# Patient Record
Sex: Female | Born: 1975 | Race: Black or African American | Hispanic: No | Smoking: Current every day smoker
Health system: Southern US, Community
[De-identification: ages and names within clinical notes are randomized; demographics above are authoritative.]

## PROBLEM LIST (undated history)

## (undated) DIAGNOSIS — B20 Human immunodeficiency virus [HIV] disease: Secondary | ICD-10-CM

## (undated) DIAGNOSIS — F329 Major depressive disorder, single episode, unspecified: Secondary | ICD-10-CM

## (undated) DIAGNOSIS — F32A Depression, unspecified: Secondary | ICD-10-CM

## (undated) DIAGNOSIS — Z21 Asymptomatic human immunodeficiency virus [HIV] infection status: Secondary | ICD-10-CM

## (undated) DIAGNOSIS — J45909 Unspecified asthma, uncomplicated: Secondary | ICD-10-CM

## (undated) HISTORY — PX: ABDOMINAL HYSTERECTOMY: SHX81

## (undated) HISTORY — PX: APPENDECTOMY: SHX54

---

## 1997-12-30 DIAGNOSIS — J45909 Unspecified asthma, uncomplicated: Secondary | ICD-10-CM | POA: Diagnosis present

## 1997-12-30 DIAGNOSIS — G43909 Migraine, unspecified, not intractable, without status migrainosus: Secondary | ICD-10-CM | POA: Insufficient documentation

## 2002-09-18 ENCOUNTER — Encounter: Payer: Self-pay | Admitting: Emergency Medicine

## 2002-09-18 ENCOUNTER — Emergency Department (HOSPITAL_COMMUNITY): Admission: EM | Admit: 2002-09-18 | Discharge: 2002-09-18 | Payer: Self-pay | Admitting: Emergency Medicine

## 2002-10-05 ENCOUNTER — Emergency Department (HOSPITAL_COMMUNITY): Admission: EM | Admit: 2002-10-05 | Discharge: 2002-10-05 | Payer: Self-pay | Admitting: *Deleted

## 2004-09-18 ENCOUNTER — Other Ambulatory Visit: Payer: Self-pay

## 2004-09-18 ENCOUNTER — Emergency Department: Payer: Self-pay | Admitting: Emergency Medicine

## 2004-11-22 ENCOUNTER — Emergency Department: Payer: Self-pay | Admitting: Emergency Medicine

## 2004-12-26 ENCOUNTER — Inpatient Hospital Stay: Payer: Self-pay | Admitting: Specialist

## 2005-01-05 ENCOUNTER — Ambulatory Visit: Payer: Self-pay | Admitting: Gastroenterology

## 2005-01-24 ENCOUNTER — Emergency Department: Payer: Self-pay | Admitting: Emergency Medicine

## 2005-02-07 ENCOUNTER — Emergency Department: Payer: Self-pay | Admitting: Emergency Medicine

## 2005-07-19 ENCOUNTER — Emergency Department: Payer: Self-pay | Admitting: Emergency Medicine

## 2005-08-06 ENCOUNTER — Emergency Department: Payer: Self-pay | Admitting: Emergency Medicine

## 2005-08-07 ENCOUNTER — Ambulatory Visit: Payer: Self-pay | Admitting: Emergency Medicine

## 2005-12-06 ENCOUNTER — Emergency Department: Payer: Self-pay | Admitting: Emergency Medicine

## 2005-12-28 ENCOUNTER — Ambulatory Visit: Payer: Self-pay | Admitting: Surgery

## 2006-01-23 ENCOUNTER — Emergency Department: Payer: Self-pay | Admitting: Emergency Medicine

## 2006-01-30 ENCOUNTER — Ambulatory Visit: Payer: Self-pay | Admitting: Oncology

## 2006-02-20 ENCOUNTER — Other Ambulatory Visit: Admission: RE | Admit: 2006-02-20 | Discharge: 2006-02-20 | Payer: Self-pay | Admitting: Gynecologic Oncology

## 2006-02-20 ENCOUNTER — Other Ambulatory Visit: Admission: RE | Admit: 2006-02-20 | Discharge: 2006-02-20 | Payer: Self-pay | Admitting: Oncology

## 2006-02-20 ENCOUNTER — Ambulatory Visit: Admission: RE | Admit: 2006-02-20 | Discharge: 2006-02-20 | Payer: Self-pay | Admitting: Gynecologic Oncology

## 2006-03-30 ENCOUNTER — Ambulatory Visit: Payer: Self-pay | Admitting: Oncology

## 2006-04-19 ENCOUNTER — Emergency Department: Payer: Self-pay | Admitting: Emergency Medicine

## 2006-04-30 ENCOUNTER — Emergency Department: Payer: Self-pay | Admitting: Emergency Medicine

## 2006-05-03 ENCOUNTER — Ambulatory Visit: Payer: Self-pay | Admitting: Unknown Physician Specialty

## 2006-05-09 ENCOUNTER — Emergency Department: Payer: Self-pay | Admitting: Unknown Physician Specialty

## 2006-06-04 ENCOUNTER — Emergency Department: Payer: Self-pay | Admitting: General Practice

## 2007-01-21 ENCOUNTER — Ambulatory Visit: Payer: Self-pay | Admitting: Unknown Physician Specialty

## 2007-01-27 ENCOUNTER — Emergency Department: Payer: Self-pay

## 2007-05-09 ENCOUNTER — Emergency Department: Payer: Self-pay | Admitting: General Practice
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
## 2007-11-26 ENCOUNTER — Emergency Department: Payer: Self-pay | Admitting: Emergency Medicine

## 2007-12-27 ENCOUNTER — Emergency Department: Payer: Self-pay | Admitting: Emergency Medicine

## 2008-01-03 ENCOUNTER — Emergency Department: Payer: Self-pay | Admitting: Emergency Medicine

## 2008-04-16 ENCOUNTER — Emergency Department: Payer: Self-pay | Admitting: Emergency Medicine

## 2008-07-07 ENCOUNTER — Emergency Department: Payer: Self-pay | Admitting: Internal Medicine

## 2008-10-19 ENCOUNTER — Emergency Department: Payer: Self-pay | Admitting: Emergency Medicine

## 2009-04-16 ENCOUNTER — Emergency Department: Payer: Self-pay | Admitting: Emergency Medicine

## 2009-10-19 DIAGNOSIS — F1411 Cocaine abuse, in remission: Secondary | ICD-10-CM | POA: Insufficient documentation

## 2010-03-14 ENCOUNTER — Emergency Department: Payer: Self-pay | Admitting: Emergency Medicine

## 2010-05-12 ENCOUNTER — Emergency Department: Payer: Self-pay | Admitting: Emergency Medicine

## 2010-06-16 ENCOUNTER — Emergency Department: Payer: Self-pay | Admitting: Emergency Medicine

## 2011-01-27 ENCOUNTER — Emergency Department: Payer: Self-pay | Admitting: Emergency Medicine

## 2011-02-23 ENCOUNTER — Emergency Department: Payer: Self-pay | Admitting: Emergency Medicine

## 2011-04-17 ENCOUNTER — Emergency Department: Payer: Self-pay | Admitting: Emergency Medicine

## 2011-06-12 ENCOUNTER — Emergency Department: Payer: Self-pay | Admitting: Unknown Physician Specialty

## 2011-07-06 ENCOUNTER — Emergency Department: Payer: Self-pay | Admitting: Emergency Medicine

## 2011-10-28 ENCOUNTER — Emergency Department: Payer: Self-pay | Admitting: Internal Medicine

## 2012-04-06 ENCOUNTER — Emergency Department: Payer: Self-pay | Admitting: *Deleted

## 2012-04-06 LAB — URINALYSIS, COMPLETE
Bilirubin,UR: NEGATIVE
Blood: NEGATIVE
Glucose,UR: NEGATIVE mg/dL (ref 0–75)
Hyaline Cast: 3
Ketone: NEGATIVE
Leukocyte Esterase: NEGATIVE
Nitrite: NEGATIVE
Ph: 5 (ref 4.5–8.0)
Protein: NEGATIVE
RBC,UR: 1 /HPF (ref 0–5)
Specific Gravity: 1.015 (ref 1.003–1.030)
Squamous Epithelial: 1
WBC UR: 4 /HPF (ref 0–5)

## 2012-04-06 LAB — CBC
HCT: 38.6 % (ref 35.0–47.0)
HGB: 12.4 g/dL (ref 12.0–16.0)
MCH: 27 pg (ref 26.0–34.0)
MCHC: 32 g/dL (ref 32.0–36.0)
MCV: 84 fL (ref 80–100)
Platelet: 285 10*3/uL (ref 150–440)
RBC: 4.58 10*6/uL (ref 3.80–5.20)
RDW: 16.1 % — ABNORMAL HIGH (ref 11.5–14.5)
WBC: 7.1 10*3/uL (ref 3.6–11.0)

## 2012-04-06 LAB — BASIC METABOLIC PANEL
Anion Gap: 11 (ref 7–16)
BUN: 14 mg/dL (ref 7–18)
Calcium, Total: 8.8 mg/dL (ref 8.5–10.1)
Chloride: 106 mmol/L (ref 98–107)
Co2: 24 mmol/L (ref 21–32)
Creatinine: 1.27 mg/dL (ref 0.60–1.30)
EGFR (African American): >60
EGFR (Non-African Amer.): 54 — ABNORMAL LOW
Glucose: 75 mg/dL (ref 65–99)
Osmolality: 280 (ref 275–301)
Potassium: 3.9 mmol/L (ref 3.5–5.1)
Sodium: 141 mmol/L (ref 136–145)

## 2012-04-06 LAB — TROPONIN I: Troponin-I: <0.02 ng/mL

## 2012-04-06 LAB — ETHANOL
Ethanol %: 0.206 % — ABNORMAL HIGH (ref 0.000–0.080)
Ethanol: 206 mg/dL

## 2012-04-07 LAB — LIPASE, BLOOD: Lipase: 138 U/L (ref 73–393)

## 2012-05-02 IMAGING — CR DG CHEST 1V PORT
1 series · 1 of 1 positions shown · non-contrast
Comparison: none

REASON FOR EXAM: Chest Pain
COMMENTS:

PROCEDURE:     DXR - DXR PORTABLE CHEST SINGLE VIEW  - January 27, 2011 [DATE]
RESULT:     Comparison is made to the prior exam of 06/16/2010.
The lung fields are clear. The heart, mediastinal and osseous structures
show no significant abnormalities. Monitoring electrodes are present.

[view not recorded]
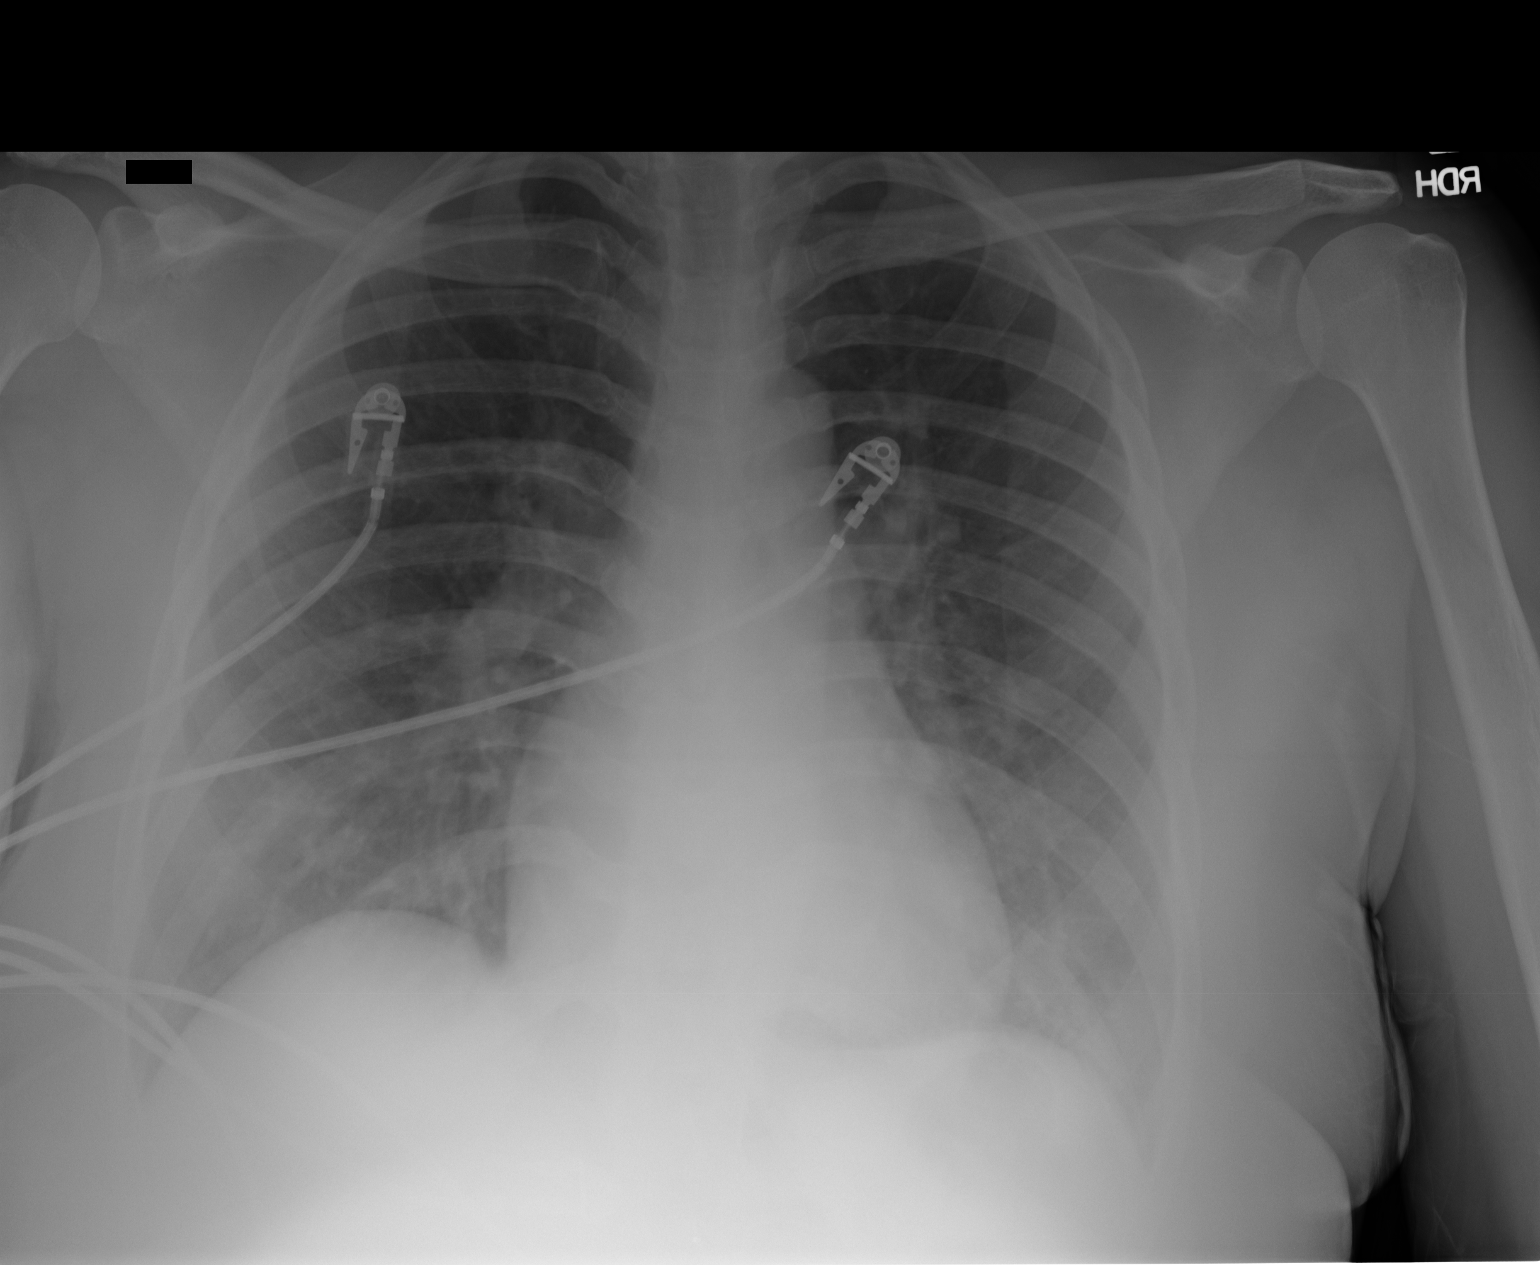

[1 of 1 positions shown; findings below may reference images not displayed]

IMPRESSION: No significant abnormalities are noted.

## 2012-07-14 ENCOUNTER — Emergency Department: Payer: Self-pay | Admitting: Emergency Medicine

## 2012-09-13 ENCOUNTER — Emergency Department: Payer: Self-pay | Admitting: Internal Medicine

## 2013-01-09 ENCOUNTER — Emergency Department: Payer: Self-pay | Admitting: Emergency Medicine

## 2013-01-09 LAB — COMPREHENSIVE METABOLIC PANEL
Albumin: 3.6 g/dL (ref 3.4–5.0)
Alkaline Phosphatase: 108 U/L (ref 50–136)
Anion Gap: 7 (ref 7–16)
BUN: 8 mg/dL (ref 7–18)
Bilirubin,Total: 2.5 mg/dL — ABNORMAL HIGH (ref 0.2–1.0)
Calcium, Total: 8.8 mg/dL (ref 8.5–10.1)
Chloride: 109 mmol/L — ABNORMAL HIGH (ref 98–107)
Co2: 23 mmol/L (ref 21–32)
Creatinine: 0.96 mg/dL (ref 0.60–1.30)
EGFR (African American): >60
EGFR (Non-African Amer.): >60
Glucose: 83 mg/dL (ref 65–99)
Osmolality: 275 (ref 275–301)
Potassium: 4.2 mmol/L (ref 3.5–5.1)
SGOT(AST): 28 U/L (ref 15–37)
SGPT (ALT): 23 U/L (ref 12–78)
Sodium: 139 mmol/L (ref 136–145)
Total Protein: 8.1 g/dL (ref 6.4–8.2)

## 2013-01-09 LAB — CBC
HCT: 37.4 % (ref 35.0–47.0)
HGB: 12.3 g/dL (ref 12.0–16.0)
MCH: 28.4 pg (ref 26.0–34.0)
MCHC: 33 g/dL (ref 32.0–36.0)
MCV: 86 fL (ref 80–100)
Platelet: 301 10*3/uL (ref 150–440)
RBC: 4.34 10*6/uL (ref 3.80–5.20)
RDW: 15.4 % — ABNORMAL HIGH (ref 11.5–14.5)
WBC: 6.6 10*3/uL (ref 3.6–11.0)

## 2013-01-09 LAB — URINALYSIS, COMPLETE
Ketone: NEGATIVE
Nitrite: NEGATIVE
Protein: NEGATIVE
RBC,UR: 2 /HPF (ref 0–5)
Specific Gravity: 1.015 (ref 1.003–1.030)
Squamous Epithelial: 4
WBC UR: 13 /HPF (ref 0–5)

## 2013-01-09 LAB — DRUG SCREEN, URINE
Amphetamines, Ur Screen: NEGATIVE (ref ?–1000)
Barbiturates, Ur Screen: NEGATIVE (ref ?–200)
Benzodiazepine, Ur Scrn: NEGATIVE (ref ?–200)
Cannabinoid 50 Ng, Ur ~~LOC~~: NEGATIVE (ref ?–50)
MDMA (Ecstasy)Ur Screen: NEGATIVE (ref ?–500)
Opiate, Ur Screen: NEGATIVE (ref ?–300)

## 2013-01-09 LAB — TSH: Thyroid Stimulating Horm: 1.11 u[IU]/mL

## 2013-01-09 LAB — ETHANOL
Ethanol %: <0.003 % (ref 0.000–0.080)
Ethanol: <3 mg/dL

## 2013-01-23 ENCOUNTER — Emergency Department: Payer: Self-pay | Admitting: Emergency Medicine

## 2013-01-23 LAB — URINALYSIS, COMPLETE
Bacteria: NONE SEEN
Bilirubin,UR: NEGATIVE
Blood: NEGATIVE
Ketone: NEGATIVE
Nitrite: NEGATIVE
Specific Gravity: 1.055 (ref 1.003–1.030)
WBC UR: 3 /HPF (ref 0–5)

## 2013-01-23 LAB — DRUG SCREEN, URINE
Amphetamines, Ur Screen: NEGATIVE (ref ?–1000)
Barbiturates, Ur Screen: NEGATIVE (ref ?–200)
Benzodiazepine, Ur Scrn: NEGATIVE (ref ?–200)
Cannabinoid 50 Ng, Ur ~~LOC~~: NEGATIVE (ref ?–50)
MDMA (Ecstasy)Ur Screen: POSITIVE (ref ?–500)
Opiate, Ur Screen: NEGATIVE (ref ?–300)
Tricyclic, Ur Screen: NEGATIVE (ref ?–1000)

## 2013-01-23 LAB — CBC
MCH: 28.3 pg (ref 26.0–34.0)
MCHC: 32.9 g/dL (ref 32.0–36.0)
RBC: 4.51 10*6/uL (ref 3.80–5.20)
RDW: 15.1 % — ABNORMAL HIGH (ref 11.5–14.5)

## 2013-01-23 LAB — COMPREHENSIVE METABOLIC PANEL
Albumin: 3.5 g/dL (ref 3.4–5.0)
Anion Gap: 9 (ref 7–16)
BUN: 7 mg/dL (ref 7–18)
Chloride: 111 mmol/L — ABNORMAL HIGH (ref 98–107)
Creatinine: 0.79 mg/dL (ref 0.60–1.30)
Osmolality: 284 (ref 275–301)
Potassium: 3.5 mmol/L (ref 3.5–5.1)
SGPT (ALT): 41 U/L (ref 12–78)
Sodium: 144 mmol/L (ref 136–145)

## 2013-01-23 LAB — TROPONIN I: Troponin-I: <0.02 ng/mL

## 2013-01-23 LAB — LACTATE DEHYDROGENASE: LDH: 109 U/L (ref 81–246)

## 2013-01-23 LAB — RAPID INFLUENZA A&B ANTIGENS

## 2013-04-04 DIAGNOSIS — G8929 Other chronic pain: Secondary | ICD-10-CM | POA: Insufficient documentation

## 2013-04-15 DIAGNOSIS — N760 Acute vaginitis: Secondary | ICD-10-CM | POA: Insufficient documentation

## 2014-02-23 ENCOUNTER — Emergency Department: Payer: Self-pay | Admitting: Emergency Medicine

## 2014-02-23 LAB — COMPREHENSIVE METABOLIC PANEL
Albumin: 3.9 g/dL (ref 3.4–5.0)
Alkaline Phosphatase: 94 U/L
Anion Gap: 4 — ABNORMAL LOW (ref 7–16)
BUN: 7 mg/dL (ref 7–18)
Bilirubin,Total: 1.5 mg/dL — ABNORMAL HIGH (ref 0.2–1.0)
Calcium, Total: 9 mg/dL (ref 8.5–10.1)
Chloride: 106 mmol/L (ref 98–107)
Co2: 28 mmol/L (ref 21–32)
Creatinine: 0.66 mg/dL (ref 0.60–1.30)
EGFR (African American): >60
EGFR (Non-African Amer.): >60
Glucose: 94 mg/dL (ref 65–99)
Osmolality: 273 (ref 275–301)
Potassium: 4.5 mmol/L (ref 3.5–5.1)
SGOT(AST): 55 U/L — ABNORMAL HIGH (ref 15–37)
SGPT (ALT): 40 U/L (ref 12–78)
Sodium: 138 mmol/L (ref 136–145)
Total Protein: 8.9 g/dL — ABNORMAL HIGH (ref 6.4–8.2)

## 2014-02-23 LAB — DRUG SCREEN, URINE

## 2014-02-23 LAB — URINALYSIS, COMPLETE
BILIRUBIN, UR: NEGATIVE
BLOOD: NEGATIVE
Bacteria: NONE SEEN
GLUCOSE, UR: NEGATIVE mg/dL (ref 0–75)
Ketone: NEGATIVE
Leukocyte Esterase: NEGATIVE
Nitrite: NEGATIVE
PH: 6 (ref 4.5–8.0)
Protein: NEGATIVE
RBC, UR: NONE SEEN /HPF (ref 0–5)
SPECIFIC GRAVITY: 1.002 (ref 1.003–1.030)
SQUAMOUS EPITHELIAL: NONE SEEN

## 2014-02-23 LAB — CBC
HCT: 40.4 % (ref 35.0–47.0)
HGB: 12.9 g/dL (ref 12.0–16.0)
MCH: 28.2 pg (ref 26.0–34.0)
MCHC: 32 g/dL (ref 32.0–36.0)
MCV: 88 fL (ref 80–100)
PLATELETS: 255 10*3/uL (ref 150–440)
RBC: 4.58 10*6/uL (ref 3.80–5.20)
RDW: 17.2 % — ABNORMAL HIGH (ref 11.5–14.5)
WBC: 4.8 10*3/uL (ref 3.6–11.0)

## 2014-02-23 LAB — ETHANOL
ETHANOL %: 0.286 % — AB (ref 0.000–0.080)
ETHANOL LVL: 286 mg/dL

## 2014-02-24 LAB — ETHANOL
ETHANOL %: 0.204 % — AB (ref 0.000–0.080)
ETHANOL LVL: 204 mg/dL

## 2014-04-17 ENCOUNTER — Emergency Department: Payer: Self-pay | Admitting: Emergency Medicine

## 2014-04-17 LAB — URINALYSIS, COMPLETE
Bilirubin,UR: NEGATIVE
Blood: NEGATIVE
Glucose,UR: NEGATIVE mg/dL (ref 0–75)
Ketone: NEGATIVE
Nitrite: POSITIVE
Ph: 6 (ref 4.5–8.0)
Protein: NEGATIVE
RBC,UR: 4 /HPF (ref 0–5)
Specific Gravity: 1.015 (ref 1.003–1.030)

## 2014-04-17 LAB — CBC
HCT: 38.2 % (ref 35.0–47.0)
HGB: 12.7 g/dL (ref 12.0–16.0)
MCH: 28.7 pg (ref 26.0–34.0)
MCHC: 33.2 g/dL (ref 32.0–36.0)
MCV: 86 fL (ref 80–100)
PLATELETS: 237 10*3/uL (ref 150–440)
RBC: 4.42 10*6/uL (ref 3.80–5.20)
RDW: 15.8 % — ABNORMAL HIGH (ref 11.5–14.5)
WBC: 4.3 10*3/uL (ref 3.6–11.0)

## 2014-04-17 LAB — COMPREHENSIVE METABOLIC PANEL
ANION GAP: 6 — AB (ref 7–16)
AST: 41 U/L — AB (ref 15–37)
Albumin: 3.4 g/dL (ref 3.4–5.0)
Alkaline Phosphatase: 92 U/L
BUN: 9 mg/dL (ref 7–18)
Bilirubin,Total: 1.6 mg/dL — ABNORMAL HIGH (ref 0.2–1.0)
CREATININE: 0.98 mg/dL (ref 0.60–1.30)
Calcium, Total: 9.2 mg/dL (ref 8.5–10.1)
Chloride: 108 mmol/L — ABNORMAL HIGH (ref 98–107)
Co2: 24 mmol/L (ref 21–32)
EGFR (African American): >60
EGFR (Non-African Amer.): >60
Glucose: 99 mg/dL (ref 65–99)
OSMOLALITY: 274 (ref 275–301)
Potassium: 3.8 mmol/L (ref 3.5–5.1)
SGPT (ALT): 32 U/L (ref 12–78)
Sodium: 138 mmol/L (ref 136–145)
Total Protein: 7.8 g/dL (ref 6.4–8.2)

## 2014-04-17 LAB — LIPASE, BLOOD: LIPASE: 199 U/L (ref 73–393)

## 2014-04-22 ENCOUNTER — Emergency Department: Payer: Self-pay | Admitting: Emergency Medicine

## 2014-04-22 LAB — COMPREHENSIVE METABOLIC PANEL
ALK PHOS: 103 U/L
ALT: 28 U/L (ref 12–78)
AST: 29 U/L (ref 15–37)
Albumin: 3.6 g/dL (ref 3.4–5.0)
Anion Gap: 6 — ABNORMAL LOW (ref 7–16)
BUN: 8 mg/dL (ref 7–18)
Bilirubin,Total: 1.6 mg/dL — ABNORMAL HIGH (ref 0.2–1.0)
CALCIUM: 9.1 mg/dL (ref 8.5–10.1)
CO2: 27 mmol/L (ref 21–32)
Chloride: 107 mmol/L (ref 98–107)
Creatinine: 0.85 mg/dL (ref 0.60–1.30)
EGFR (African American): >60
Glucose: 98 mg/dL (ref 65–99)
Osmolality: 278 (ref 275–301)
Potassium: 4 mmol/L (ref 3.5–5.1)
Sodium: 140 mmol/L (ref 136–145)
TOTAL PROTEIN: 8.3 g/dL — AB (ref 6.4–8.2)

## 2014-04-22 LAB — URINALYSIS, COMPLETE
BILIRUBIN, UR: NEGATIVE
BLOOD: NEGATIVE
Glucose,UR: NEGATIVE mg/dL (ref 0–75)
KETONE: NEGATIVE
Leukocyte Esterase: NEGATIVE
Nitrite: NEGATIVE
Ph: 5 (ref 4.5–8.0)
Protein: NEGATIVE
RBC,UR: NONE SEEN /HPF (ref 0–5)
Specific Gravity: 1.008 (ref 1.003–1.030)
Squamous Epithelial: 5
WBC UR: 5 /HPF (ref 0–5)

## 2014-04-22 LAB — CBC WITH DIFFERENTIAL/PLATELET
Basophil #: 0.2 10*3/uL — ABNORMAL HIGH (ref 0.0–0.1)
Basophil %: 2.6 %
EOS PCT: 1.7 %
Eosinophil #: 0.1 10*3/uL (ref 0.0–0.7)
HCT: 39.2 % (ref 35.0–47.0)
HGB: 12.5 g/dL (ref 12.0–16.0)
Lymphocyte #: 2.5 10*3/uL (ref 1.0–3.6)
Lymphocyte %: 43.2 %
MCH: 28.1 pg (ref 26.0–34.0)
MCHC: 31.8 g/dL — ABNORMAL LOW (ref 32.0–36.0)
MCV: 88 fL (ref 80–100)
MONOS PCT: 8.3 %
Monocyte #: 0.5 x10 3/mm (ref 0.2–0.9)
NEUTROS ABS: 2.6 10*3/uL (ref 1.4–6.5)
Neutrophil %: 44.2 %
PLATELETS: 223 10*3/uL (ref 150–440)
RBC: 4.44 10*6/uL (ref 3.80–5.20)
RDW: 15.9 % — ABNORMAL HIGH (ref 11.5–14.5)
WBC: 5.8 10*3/uL (ref 3.6–11.0)

## 2014-04-22 LAB — LIPASE, BLOOD: Lipase: 204 U/L (ref 73–393)

## 2014-06-17 ENCOUNTER — Emergency Department: Payer: Self-pay | Admitting: Emergency Medicine

## 2014-06-17 LAB — BODY FLUID CELL COUNT WITH DIFFERENTIAL
Basophil: 0 %
Eosinophil: 1 %
Lymphocytes: 33 %
Neutrophils: 58 %
Nucleated Cell Count: 92 /mm3
Other Cells BF: 0 %
Other Mononuclear Cells: 8 %

## 2014-06-17 LAB — SYNOVIAL FLUID, CRYSTAL: Crystals, Joint Fluid: NONE SEEN

## 2014-06-22 ENCOUNTER — Emergency Department: Payer: Self-pay | Admitting: Emergency Medicine

## 2014-06-22 LAB — COMPREHENSIVE METABOLIC PANEL
ALK PHOS: 111 U/L
ALT: 32 U/L (ref 12–78)
Albumin: 3.5 g/dL (ref 3.4–5.0)
Anion Gap: 7 (ref 7–16)
BILIRUBIN TOTAL: 1.8 mg/dL — AB (ref 0.2–1.0)
BUN: 6 mg/dL — ABNORMAL LOW (ref 7–18)
CALCIUM: 8.9 mg/dL (ref 8.5–10.1)
Chloride: 108 mmol/L — ABNORMAL HIGH (ref 98–107)
Co2: 24 mmol/L (ref 21–32)
Creatinine: 0.99 mg/dL (ref 0.60–1.30)
EGFR (Non-African Amer.): >60
GLUCOSE: 97 mg/dL (ref 65–99)
Osmolality: 275 (ref 275–301)
Potassium: 4 mmol/L (ref 3.5–5.1)
SGOT(AST): 32 U/L (ref 15–37)
Sodium: 139 mmol/L (ref 136–145)
Total Protein: 8.3 g/dL — ABNORMAL HIGH (ref 6.4–8.2)

## 2014-06-22 LAB — CBC
HCT: 36.7 % (ref 35.0–47.0)
HGB: 11.9 g/dL — AB (ref 12.0–16.0)
MCH: 28.5 pg (ref 26.0–34.0)
MCHC: 32.3 g/dL (ref 32.0–36.0)
MCV: 88 fL (ref 80–100)
Platelet: 233 10*3/uL (ref 150–440)
RBC: 4.17 10*6/uL (ref 3.80–5.20)
RDW: 15.1 % — AB (ref 11.5–14.5)
WBC: 5.8 10*3/uL (ref 3.6–11.0)

## 2014-06-22 LAB — URINALYSIS, COMPLETE
BACTERIA: NONE SEEN
Bilirubin,UR: NEGATIVE
Blood: NEGATIVE
Glucose,UR: NEGATIVE mg/dL (ref 0–75)
KETONE: NEGATIVE
Leukocyte Esterase: NEGATIVE
Nitrite: NEGATIVE
PH: 6 (ref 4.5–8.0)
Protein: NEGATIVE
RBC,UR: <1 /HPF (ref 0–5)
Specific Gravity: 1.002 (ref 1.003–1.030)
WBC UR: <1 /HPF (ref 0–5)

## 2014-06-22 LAB — ETHANOL
ETHANOL LVL: 333 mg/dL — AB
Ethanol %: 0.333 % (ref 0.000–0.080)

## 2014-06-22 LAB — ACETAMINOPHEN LEVEL: Acetaminophen: <2 ug/mL

## 2014-06-22 LAB — SALICYLATE LEVEL: Salicylates, Serum: <1.7 mg/dL

## 2014-06-23 LAB — DRUG SCREEN, URINE
Amphetamines, Ur Screen: NEGATIVE (ref ?–1000)
BENZODIAZEPINE, UR SCRN: NEGATIVE (ref ?–200)
Barbiturates, Ur Screen: NEGATIVE (ref ?–200)
COCAINE METABOLITE, UR ~~LOC~~: NEGATIVE (ref ?–300)
Cannabinoid 50 Ng, Ur ~~LOC~~: NEGATIVE (ref ?–50)
MDMA (Ecstasy)Ur Screen: NEGATIVE (ref ?–500)
METHADONE, UR SCREEN: NEGATIVE (ref ?–300)
Opiate, Ur Screen: NEGATIVE (ref ?–300)
PHENCYCLIDINE (PCP) UR S: NEGATIVE (ref ?–25)
Tricyclic, Ur Screen: NEGATIVE (ref ?–1000)

## 2014-08-10 DIAGNOSIS — Z720 Tobacco use: Secondary | ICD-10-CM | POA: Insufficient documentation

## 2014-08-18 ENCOUNTER — Emergency Department: Payer: Self-pay | Admitting: Emergency Medicine

## 2014-08-18 LAB — URINALYSIS, COMPLETE
BACTERIA: NONE SEEN
Bilirubin,UR: NEGATIVE
Blood: NEGATIVE
Glucose,UR: 50 mg/dL (ref 0–75)
KETONE: NEGATIVE
Leukocyte Esterase: NEGATIVE
Nitrite: NEGATIVE
Ph: 6 (ref 4.5–8.0)
Protein: NEGATIVE
RBC,UR: 1 /HPF (ref 0–5)
Specific Gravity: 1.02 (ref 1.003–1.030)

## 2014-08-18 LAB — COMPREHENSIVE METABOLIC PANEL
ALBUMIN: 3.2 g/dL — AB (ref 3.4–5.0)
ANION GAP: 1 — AB (ref 7–16)
AST: 54 U/L — AB (ref 15–37)
Alkaline Phosphatase: 115 U/L
BUN: 10 mg/dL (ref 7–18)
Bilirubin,Total: 1.7 mg/dL — ABNORMAL HIGH (ref 0.2–1.0)
Calcium, Total: 8.7 mg/dL (ref 8.5–10.1)
Chloride: 107 mmol/L (ref 98–107)
Co2: 26 mmol/L (ref 21–32)
Creatinine: 0.87 mg/dL (ref 0.60–1.30)
EGFR (African American): >60
GLUCOSE: 133 mg/dL — AB (ref 65–99)
Osmolality: 269 (ref 275–301)
POTASSIUM: 4 mmol/L (ref 3.5–5.1)
SGPT (ALT): 38 U/L
Sodium: 134 mmol/L — ABNORMAL LOW (ref 136–145)
Total Protein: 7.9 g/dL (ref 6.4–8.2)

## 2014-08-18 LAB — CBC WITH DIFFERENTIAL/PLATELET
Basophil #: 0.1 10*3/uL (ref 0.0–0.1)
Basophil %: 3 %
EOS ABS: 0.1 10*3/uL (ref 0.0–0.7)
Eosinophil %: 1.2 %
HCT: 40 % (ref 35.0–47.0)
HGB: 12.6 g/dL (ref 12.0–16.0)
Lymphocyte #: 1.3 10*3/uL (ref 1.0–3.6)
Lymphocyte %: 26.5 %
MCH: 27.5 pg (ref 26.0–34.0)
MCHC: 31.4 g/dL — AB (ref 32.0–36.0)
MCV: 87 fL (ref 80–100)
Monocyte #: 0.2 x10 3/mm (ref 0.2–0.9)
Monocyte %: 3.4 %
Neutrophil #: 3.3 10*3/uL (ref 1.4–6.5)
Neutrophil %: 65.9 %
PLATELETS: 233 10*3/uL (ref 150–440)
RBC: 4.58 10*6/uL (ref 3.80–5.20)
RDW: 15.2 % — ABNORMAL HIGH (ref 11.5–14.5)
WBC: 5 10*3/uL (ref 3.6–11.0)

## 2014-08-18 LAB — LIPASE, BLOOD: LIPASE: 259 U/L (ref 73–393)

## 2014-11-19 ENCOUNTER — Emergency Department: Payer: Self-pay | Admitting: Emergency Medicine

## 2014-11-19 LAB — CBC
HCT: 36.6 % (ref 35.0–47.0)
HGB: 11.6 g/dL — AB (ref 12.0–16.0)
MCH: 27.7 pg (ref 26.0–34.0)
MCHC: 31.6 g/dL — AB (ref 32.0–36.0)
MCV: 88 fL (ref 80–100)
PLATELETS: 233 10*3/uL (ref 150–440)
RBC: 4.18 10*6/uL (ref 3.80–5.20)
RDW: 16.9 % — AB (ref 11.5–14.5)
WBC: 8.3 10*3/uL (ref 3.6–11.0)

## 2014-11-19 LAB — BASIC METABOLIC PANEL
ANION GAP: 8 (ref 7–16)
BUN: 5 mg/dL — ABNORMAL LOW (ref 7–18)
CALCIUM: 8.2 mg/dL — AB (ref 8.5–10.1)
Chloride: 105 mmol/L (ref 98–107)
Co2: 27 mmol/L (ref 21–32)
Creatinine: 1.07 mg/dL (ref 0.60–1.30)
EGFR (African American): >60
EGFR (Non-African Amer.): >60
Glucose: 98 mg/dL (ref 65–99)
Osmolality: 277 (ref 275–301)
Potassium: 3.1 mmol/L — ABNORMAL LOW (ref 3.5–5.1)
SODIUM: 140 mmol/L (ref 136–145)

## 2014-11-19 LAB — TROPONIN I

## 2014-11-20 LAB — HEPATIC FUNCTION PANEL A (ARMC)
ALBUMIN: 2.8 g/dL — AB (ref 3.4–5.0)
Alkaline Phosphatase: 112 U/L
BILIRUBIN TOTAL: 0.6 mg/dL (ref 0.2–1.0)
Bilirubin, Direct: 0.2 mg/dL (ref 0.0–0.2)
SGOT(AST): 28 U/L (ref 15–37)
SGPT (ALT): 29 U/L
TOTAL PROTEIN: 7.7 g/dL (ref 6.4–8.2)

## 2014-12-22 ENCOUNTER — Emergency Department: Payer: Self-pay | Admitting: Emergency Medicine

## 2015-03-15 ENCOUNTER — Emergency Department
Admit: 2015-03-15 | Disposition: A | Discharge status: Home / Self Care | Payer: Self-pay | Admitting: Emergency Medicine

## 2015-03-15 LAB — COMPREHENSIVE METABOLIC PANEL
ALK PHOS: 111 U/L
Albumin: 3.4 g/dL — ABNORMAL LOW
Anion Gap: 7 (ref 7–16)
BILIRUBIN TOTAL: 0.5 mg/dL
BUN: 11 mg/dL
CO2: 28 mmol/L
CREATININE: 0.78 mg/dL
Calcium, Total: 8.5 mg/dL — ABNORMAL LOW
Chloride: 105 mmol/L
EGFR (African American): >60
Glucose: 91 mg/dL
Potassium: 4.2 mmol/L
SGOT(AST): 47 U/L — ABNORMAL HIGH
SGPT (ALT): 45 U/L
SODIUM: 140 mmol/L
Total Protein: 7.3 g/dL

## 2015-03-15 LAB — CBC
HCT: 36.9 % (ref 35.0–47.0)
HGB: 11.9 g/dL — AB (ref 12.0–16.0)
MCH: 27.2 pg (ref 26.0–34.0)
MCHC: 32.2 g/dL (ref 32.0–36.0)
MCV: 84 fL (ref 80–100)
Platelet: 297 10*3/uL (ref 150–440)
RBC: 4.36 10*6/uL (ref 3.80–5.20)
RDW: 14.9 % — ABNORMAL HIGH (ref 11.5–14.5)
WBC: 7.1 10*3/uL (ref 3.6–11.0)

## 2015-03-20 LAB — CULTURE, BLOOD (SINGLE)
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
## 2015-09-11 ENCOUNTER — Emergency Department
Admission: EM | Admit: 2015-09-11 | Discharge: 2015-09-11 | Discharge status: Against Medical Advice | Payer: Medicaid Other | Attending: Emergency Medicine | Admitting: Emergency Medicine

## 2015-09-11 ENCOUNTER — Encounter: Payer: Self-pay | Admitting: Emergency Medicine

## 2015-09-11 DIAGNOSIS — R300 Dysuria: Secondary | ICD-10-CM | POA: Diagnosis not present

## 2015-09-11 DIAGNOSIS — R109 Unspecified abdominal pain: Secondary | ICD-10-CM | POA: Insufficient documentation

## 2015-09-11 DIAGNOSIS — Z720 Tobacco use: Secondary | ICD-10-CM | POA: Insufficient documentation

## 2015-09-11 HISTORY — DX: Asymptomatic human immunodeficiency virus (hiv) infection status: Z21

## 2015-09-11 HISTORY — DX: Human immunodeficiency virus (HIV) disease: B20

## 2015-09-11 HISTORY — DX: Major depressive disorder, single episode, unspecified: F32.9

## 2015-09-11 HISTORY — DX: Depression, unspecified: F32.A

## 2015-09-11 LAB — URINALYSIS COMPLETE WITH MICROSCOPIC (ARMC ONLY)
Bilirubin Urine: NEGATIVE
Glucose, UA: NEGATIVE mg/dL
Hgb urine dipstick: NEGATIVE
Ketones, ur: NEGATIVE mg/dL
NITRITE: NEGATIVE
Protein, ur: NEGATIVE mg/dL
SPECIFIC GRAVITY, URINE: 1.015 (ref 1.005–1.030)
pH: 5 (ref 5.0–8.0)

## 2015-09-11 LAB — CBC
HEMATOCRIT: 39.1 % (ref 35.0–47.0)
Hemoglobin: 12.6 g/dL (ref 12.0–16.0)
MCH: 27.4 pg (ref 26.0–34.0)
MCHC: 32.3 g/dL (ref 32.0–36.0)
MCV: 84.8 fL (ref 80.0–100.0)
PLATELETS: 366 10*3/uL (ref 150–440)
RBC: 4.61 MIL/uL (ref 3.80–5.20)
RDW: 17 % — AB (ref 11.5–14.5)
WBC: 10.5 10*3/uL (ref 3.6–11.0)

## 2015-09-11 LAB — BASIC METABOLIC PANEL
Anion gap: 8 (ref 5–15)
BUN: 8 mg/dL (ref 6–20)
CO2: 21 mmol/L — ABNORMAL LOW (ref 22–32)
CREATININE: 0.93 mg/dL (ref 0.44–1.00)
Calcium: 8.9 mg/dL (ref 8.9–10.3)
Chloride: 111 mmol/L (ref 101–111)
GFR calc Af Amer: >60 mL/min (ref >60)
Glucose, Bld: 117 mg/dL — ABNORMAL HIGH (ref 65–99)
POTASSIUM: 3.6 mmol/L (ref 3.5–5.1)
Sodium: 140 mmol/L (ref 135–145)

## 2015-09-11 NOTE — ED Notes (Signed)
Patient reports right flank pain and dysuria for approximately 2 weeks.

## 2015-09-11 NOTE — ED Notes (Signed)
Patient stating she does not want to wait any longer.  Explained to patient that it is a busy night and working to get everyone seen and treated.  Saline loc discontinued.

## 2015-09-11 NOTE — ED Notes (Signed)
Patient has decided to stay and be treated.

## 2015-09-13 ENCOUNTER — Telehealth: Payer: Self-pay | Admitting: Emergency Medicine

## 2015-09-13 NOTE — ED Notes (Signed)
Called patient due to lwot to inquire about condition and follow up plans.  No answer and no voicemail  

## 2015-09-14 ENCOUNTER — Ambulatory Visit
Admission: RE | Admit: 2015-09-14 | Discharge: 2015-09-14 | Disposition: A | Discharge status: Home / Self Care | Payer: Medicaid Other | Source: Ambulatory Visit | Attending: Physician Assistant | Admitting: Physician Assistant

## 2015-09-14 ENCOUNTER — Other Ambulatory Visit: Payer: Self-pay | Admitting: Physician Assistant

## 2015-09-14 DIAGNOSIS — M549 Dorsalgia, unspecified: Secondary | ICD-10-CM

## 2015-09-14 DIAGNOSIS — M47816 Spondylosis without myelopathy or radiculopathy, lumbar region: Secondary | ICD-10-CM | POA: Insufficient documentation

## 2015-09-14 DIAGNOSIS — M545 Low back pain: Secondary | ICD-10-CM | POA: Diagnosis present

## 2015-11-13 ENCOUNTER — Encounter: Payer: Self-pay | Admitting: Emergency Medicine

## 2015-11-13 ENCOUNTER — Emergency Department
Admission: EM | Admit: 2015-11-13 | Discharge: 2015-11-13 | Disposition: A | Discharge status: Home / Self Care | Payer: Medicaid Other | Attending: Emergency Medicine | Admitting: Emergency Medicine

## 2015-11-13 ENCOUNTER — Emergency Department: Payer: Medicaid Other

## 2015-11-13 DIAGNOSIS — F17210 Nicotine dependence, cigarettes, uncomplicated: Secondary | ICD-10-CM | POA: Insufficient documentation

## 2015-11-13 DIAGNOSIS — R1084 Generalized abdominal pain: Secondary | ICD-10-CM | POA: Insufficient documentation

## 2015-11-13 DIAGNOSIS — Z79899 Other long term (current) drug therapy: Secondary | ICD-10-CM | POA: Diagnosis not present

## 2015-11-13 DIAGNOSIS — Z7952 Long term (current) use of systemic steroids: Secondary | ICD-10-CM | POA: Diagnosis not present

## 2015-11-13 DIAGNOSIS — R110 Nausea: Secondary | ICD-10-CM | POA: Diagnosis not present

## 2015-11-13 DIAGNOSIS — R109 Unspecified abdominal pain: Secondary | ICD-10-CM | POA: Diagnosis present

## 2015-11-13 HISTORY — DX: Unspecified asthma, uncomplicated: J45.909

## 2015-11-13 LAB — URINALYSIS COMPLETE WITH MICROSCOPIC (ARMC ONLY)
Bacteria, UA: NONE SEEN
Bilirubin Urine: NEGATIVE
Glucose, UA: NEGATIVE mg/dL
HGB URINE DIPSTICK: NEGATIVE
Ketones, ur: NEGATIVE mg/dL
Leukocytes, UA: NEGATIVE
Nitrite: NEGATIVE
PROTEIN: NEGATIVE mg/dL
SPECIFIC GRAVITY, URINE: 1.002 — AB (ref 1.005–1.030)
pH: 5 (ref 5.0–8.0)

## 2015-11-13 LAB — LIPASE, BLOOD: Lipase: 42 U/L (ref 11–51)

## 2015-11-13 LAB — CBC WITH DIFFERENTIAL/PLATELET
BASOS ABS: 0.1 10*3/uL (ref 0–0.1)
Basophils Relative: 1 %
EOS ABS: 0.1 10*3/uL (ref 0–0.7)
EOS PCT: 1 %
HCT: 39.1 % (ref 35.0–47.0)
Hemoglobin: 12.8 g/dL (ref 12.0–16.0)
Lymphocytes Relative: 26 %
Lymphs Abs: 2.3 10*3/uL (ref 1.0–3.6)
MCH: 26.9 pg (ref 26.0–34.0)
MCHC: 32.6 g/dL (ref 32.0–36.0)
MCV: 82.6 fL (ref 80.0–100.0)
Monocytes Absolute: 0.7 10*3/uL (ref 0.2–0.9)
Monocytes Relative: 8 %
NEUTROS PCT: 64 %
Neutro Abs: 5.9 10*3/uL (ref 1.4–6.5)
PLATELETS: 323 10*3/uL (ref 150–440)
RBC: 4.74 MIL/uL (ref 3.80–5.20)
RDW: 16.2 % — ABNORMAL HIGH (ref 11.5–14.5)
WBC: 9.1 10*3/uL (ref 3.6–11.0)

## 2015-11-13 LAB — URINE DRUG SCREEN, QUALITATIVE (ARMC ONLY)
AMPHETAMINES, UR SCREEN: NOT DETECTED
BENZODIAZEPINE, UR SCRN: NOT DETECTED
Barbiturates, Ur Screen: NOT DETECTED
Cannabinoid 50 Ng, Ur ~~LOC~~: NOT DETECTED
Cocaine Metabolite,Ur ~~LOC~~: NOT DETECTED
MDMA (Ecstasy)Ur Screen: NOT DETECTED
Methadone Scn, Ur: NOT DETECTED
Opiate, Ur Screen: NOT DETECTED
PHENCYCLIDINE (PCP) UR S: NOT DETECTED
Tricyclic, Ur Screen: NOT DETECTED

## 2015-11-13 LAB — COMPREHENSIVE METABOLIC PANEL
ALBUMIN: 3.8 g/dL (ref 3.5–5.0)
ALT: 26 U/L (ref 14–54)
AST: 23 U/L (ref 15–41)
Alkaline Phosphatase: 110 U/L (ref 38–126)
Anion gap: 8 (ref 5–15)
BUN: 10 mg/dL (ref 6–20)
CHLORIDE: 107 mmol/L (ref 101–111)
CO2: 24 mmol/L (ref 22–32)
Calcium: 9.1 mg/dL (ref 8.9–10.3)
Creatinine, Ser: 0.81 mg/dL (ref 0.44–1.00)
GFR calc non Af Amer: >60 mL/min (ref >60)
Glucose, Bld: 96 mg/dL (ref 65–99)
Potassium: 4.1 mmol/L (ref 3.5–5.1)
Sodium: 139 mmol/L (ref 135–145)
Total Bilirubin: 0.9 mg/dL (ref 0.3–1.2)
Total Protein: 8.1 g/dL (ref 6.5–8.1)

## 2015-11-13 MED ORDER — ONDANSETRON HCL 4 MG/2ML IJ SOLN
4.0000 mg | Freq: Once | INTRAMUSCULAR | Status: AC
  Administered 2015-11-13: 4 mg via INTRAVENOUS
  Filled 2015-11-13: qty 2

## 2015-11-13 MED ORDER — SODIUM CHLORIDE 0.9 % IV BOLUS (SEPSIS): 500.0000 mL | Freq: Once | INTRAVENOUS | Status: DC

## 2015-11-13 MED ORDER — SODIUM CHLORIDE 0.9 % IV BOLUS (SEPSIS)
1000.0000 mL | Freq: Once | INTRAVENOUS | Status: AC
  Administered 2015-11-13: 1000 mL via INTRAVENOUS

## 2015-11-13 MED ORDER — KETOROLAC TROMETHAMINE 30 MG/ML IJ SOLN
30.0000 mg | Freq: Once | INTRAMUSCULAR | Status: AC
  Administered 2015-11-13: 30 mg via INTRAVENOUS
  Filled 2015-11-13: qty 1

## 2015-11-13 MED ORDER — IOHEXOL 240 MG/ML SOLN
25.0000 mL | Freq: Once | INTRAMUSCULAR | Status: AC | PRN
  Administered 2015-11-13: 25 mL via ORAL

## 2015-11-13 MED ORDER — NAPROXEN 500 MG PO TABS: 500.0000 mg | ORAL_TABLET | Freq: Two times a day (BID) | ORAL | Status: DC

## 2015-11-13 MED ORDER — ONDANSETRON 8 MG PO TBDP: 8.0000 mg | ORAL_TABLET | Freq: Three times a day (TID) | ORAL | Status: DC | PRN

## 2015-11-13 MED ORDER — IOHEXOL 300 MG/ML  SOLN
125.0000 mL | Freq: Once | INTRAMUSCULAR | Status: AC | PRN
  Administered 2015-11-13: 125 mL via INTRAVENOUS

## 2015-11-13 NOTE — ED Notes (Signed)
Pt request percoset, pt agreed to wait until after CT scan, Marylene LandAngela, RN notified

## 2015-11-13 NOTE — ED Notes (Signed)
Patient presents to Emergency Department via EMS with complaints of lower abdominal pain started 2 days ago with a [facility], pt taking fluconazole, pt reports hx of HIV and asthma.

## 2015-11-13 NOTE — ED Notes (Signed)
CT called pt finished contrast  

## 2015-11-13 NOTE — ED Notes (Signed)
Lab, Lennox LaityAshley,  Called for UDS add on

## 2015-11-13 NOTE — ED Provider Notes (Signed)
[facility] Emergency Department Provider Note  ____________________________________________  Time seen: Approximately 6:26 AM  I have reviewed the triage vital signs and the nursing notes.   HISTORY  Chief Complaint Abdominal Pain    HPI Meghan Jarvis is a 39 y.o. female who presents to the ED via EMS from homewith a chief complaint of abdominal pain. Patient states she has been experiencing generalized abdominal pain times one month. 2 days ago she was diagnosed with a yeast infection at [facility] clinic and prescribed fluconazole. Patient has a past medical history significant for HIV and asthma. Denies recent fever, chills, chest pain, shortness of breath, vomiting, diarrhea, dysuria. States she occasionally has nausea. Denies vaginal discharge. Initially told triage nurse her pain was in her left adnexal region; on my exam she is complaining of generalized abdominal pain. Denies recent travel or trauma. Nothing makes her pain better or worse.   Past Medical History  Diagnosis Date  . HIV (human immunodeficiency virus infection) (HCC)   . Depression     There are no active problems to display for this patient.   Past Surgical History  Procedure Laterality Date  . Appendectomy    . Abdominal hysterectomy      Current Outpatient Rx  Name  Route  Sig  Dispense  Refill  . atazanavir-cobicistat (EVOTAZ) 300-150 MG tablet   Oral   Take 1 tablet by mouth daily. Swallow whole. Do NOT crush, cut or chew tablet. Take with food.         . clobetasol ointment (TEMOVATE) 0.05 %   Topical   Apply 1 application topically 2 (two) times daily.         . DULoxetine (CYMBALTA) 60 MG capsule   Oral   Take 60 mg by mouth daily.         Marland Kitchen emtricitabine-tenofovir (TRUVADA) 200-300 MG tablet   Oral   Take 1 tablet by mouth daily.         . fluconazole (DIFLUCAN) 150 MG tablet   Oral   Take 150 mg by mouth once. Repeat in three days as needed        . polycarbophil (FIBERCON) 625 MG tablet   Oral   Take 625 mg by mouth daily.           Allergies Morphine and related  History reviewed. No pertinent family history.  Social History Social History  Substance Use Topics  . Smoking status: Current Every Day Smoker -- 1.00 packs/day    Types: Cigarettes  . Smokeless tobacco: Never Used  . Alcohol Use: Yes    Review of Systems Constitutional: No fever/chills Eyes: No visual changes. ENT: No sore throat. Cardiovascular: Denies chest pain. Respiratory: Denies shortness of breath. Gastrointestinal: Positive for abdominal pain.  No nausea, no vomiting.  No diarrhea.  No constipation. Genitourinary: Negative for dysuria. Musculoskeletal: Negative for back pain. Skin: Negative for rash. Neurological: Negative for headaches, focal weakness or numbness.  10-point ROS otherwise negative.  ____________________________________________   PHYSICAL EXAM:  VITAL SIGNS: ED Triage Vitals  Enc Vitals Group     BP 11/13/15 0541 108/68 mmHg     Pulse Rate 11/13/15 0541 91     Resp 11/13/15 0541 20     Temp 11/13/15 0541 98.2 F (36.8 C)     Temp Source 11/13/15 0541 Oral     SpO2 11/13/15 0541 96 %     Weight 11/13/15 0533 252 lb (114.306 kg)     Height  11/13/15 0533 5\' 6"  (1.676 m)     Head Cir --      Peak Flow --      Pain Score 11/13/15 0534 10     Pain Loc --      Pain Edu? --      Excl. in GC? --     Constitutional: Asleep, easily awakened for exam. Alert and oriented. Well appearing and in no acute distress. Eyes: Conjunctivae are normal. PERRL. EOMI. Head: Atraumatic. Nose: No congestion/rhinnorhea. Mouth/Throat: Mucous membranes are moist.  Oropharynx non-erythematous. Neck: No stridor.   Cardiovascular: Normal rate, regular rhythm. Grossly normal heart sounds.  Good peripheral circulation. Respiratory: Normal respiratory effort.  No retractions. Lungs CTAB. Gastrointestinal: Soft and mildly tender to  palpation diffusely without rebound or guarding. No distention. No abdominal bruits. No CVA tenderness. Musculoskeletal: No lower extremity tenderness nor edema.  No joint effusions. Neurologic:  Normal speech and language. No gross focal neurologic deficits are appreciated. No gait instability. Skin:  Skin is warm, dry and intact. No rash noted. Psychiatric: Mood and affect are normal. Speech and behavior are normal.  ____________________________________________   LABS (all labs ordered are listed, but only abnormal results are displayed)  Labs Reviewed  CBC WITH DIFFERENTIAL/PLATELET - Abnormal; Notable for the following:    RDW 16.2 (*)    All other components within normal limits  URINALYSIS COMPLETEWITH MICROSCOPIC ([facility] ONLY) - Abnormal; Notable for the following:    Color, Urine STRAW (*)    APPearance CLEAR (*)    Specific Gravity, Urine 1.002 (*)    Squamous Epithelial / LPF 0-5 (*)    All other components within normal limits  COMPREHENSIVE METABOLIC PANEL  LIPASE, BLOOD   ____________________________________________  EKG  None ____________________________________________  RADIOLOGY  Pending ____________________________________________   PROCEDURES  Procedure(s) performed: None  Critical Care performed: No  ____________________________________________   INITIAL IMPRESSION / ASSESSMENT AND PLAN / ED COURSE  Pertinent labs & imaging results that were available during my care of the patient were reviewed by me and considered in my medical decision making (see chart for details).  39 year old female with a history of HIV who presents with generalized abdominal pain without associated fever, vomiting or diarrhea. Given patient's HIV history, will obtain CT abdomen/pelvis to evaluate etiology of patient's pain.  ----------------------------------------- 7:08 AM on 11/13/2015 -----------------------------------------  Patient working on oral contrast for  CT scan. Care transferred to Dr. Scotty CourtStafford. ____________________________________________   FINAL CLINICAL IMPRESSION(S) / ED DIAGNOSES  Final diagnoses:  Generalized abdominal pain      Irean HongJade J Tayvion Lauder, MD 11/13/15 [phone]

## 2015-11-13 NOTE — ED Provider Notes (Signed)
-----------------------------------------   9:15 AM on 11/13/2015 -----------------------------------------  Assumed care of patient from Dr. Megan Salonsong at 7:00 AM with CT scan pending. Vital signs normal, labs unremarkable. CT scan unremarkable. Patient counseled on return precautions and warning signs with her generalized abdominal pain. At this time of year and with all the viral illnesses that we've been seeing in our emergency department in community recently, likely this is the early side of a viral GI illness. Patient counseled on hydration and follow up with her primary care doctor. Low suspicion for mesenteric ischemia or occult biliary pathology.  Meghan CheekPhillip Valeree Leidy, MD 11/13/15 [phone]

## 2015-11-13 NOTE — Discharge Instructions (Signed)
You were prescribed a medication that is potentially sedating (promethazine). Do not drink alcohol, drive or participate in any other potentially dangerous activities while taking this medication as it may make you sleepy. Do not take this medication with any other sedating medications, either prescription or over-the-counter.   This medication is intended for your use only - do not give any to anyone else and keep it in a secure place where nobody else, especially children and pets, have access to it.    Abdominal Pain, Adult Many things can cause abdominal pain. Usually, abdominal pain is not caused by a disease and will improve without treatment. It can often be observed and treated at home. Your health care provider will do a physical exam and possibly order blood tests and X-rays to help determine the seriousness of your pain. However, in many cases, more time must pass before a clear cause of the pain can be found. Before that point, your health care provider may not know if you need more testing or further treatment. HOME CARE INSTRUCTIONS Monitor your abdominal pain for any changes. The following actions may help to alleviate any discomfort you are experiencing:  Only take over-the-counter or prescription medicines as directed by your health care provider.  Do not take laxatives unless directed to do so by your health care provider.  Try a clear liquid diet (broth, tea, or water) as directed by your health care provider. Slowly move to a bland diet as tolerated. SEEK MEDICAL CARE IF:  You have unexplained abdominal pain.  You have abdominal pain associated with nausea or diarrhea.  You have pain when you urinate or have a bowel movement.  You experience abdominal pain that wakes you in the night.  You have abdominal pain that is worsened or improved by eating food.  You have abdominal pain that is worsened with eating fatty foods.  You have a fever. SEEK IMMEDIATE MEDICAL CARE  IF:  Your pain does not go away within 2 hours.  You keep throwing up (vomiting).  Your pain is felt only in portions of the abdomen, such as the right side or the left lower portion of the abdomen.  You pass bloody or black tarry stools. MAKE SURE YOU:  Understand these instructions.  Will watch your condition.  Will get help right away if you are not doing well or get worse.   This information is not intended to replace advice given to you by your health care provider. Make sure you discuss any questions you have with your health care provider.   Document Released: 09/06/2005 Document Revised: 08/18/2015 Document Reviewed: 08/06/2013 Elsevier Interactive Patient Education [employer]2016 [employer].

## 2016-02-14 ENCOUNTER — Emergency Department
Admission: EM | Admit: 2016-02-14 | Discharge: 2016-02-14 | Disposition: A | Discharge status: Home / Self Care | Payer: Medicaid Other | Attending: Emergency Medicine | Admitting: Emergency Medicine

## 2016-02-14 DIAGNOSIS — F17210 Nicotine dependence, cigarettes, uncomplicated: Secondary | ICD-10-CM | POA: Diagnosis not present

## 2016-02-14 DIAGNOSIS — R1030 Lower abdominal pain, unspecified: Secondary | ICD-10-CM | POA: Insufficient documentation

## 2016-02-14 DIAGNOSIS — R112 Nausea with vomiting, unspecified: Secondary | ICD-10-CM | POA: Diagnosis not present

## 2016-02-14 LAB — URINALYSIS COMPLETE WITH MICROSCOPIC (ARMC ONLY)
BACTERIA UA: NONE SEEN
Bilirubin Urine: NEGATIVE
Glucose, UA: NEGATIVE mg/dL
HGB URINE DIPSTICK: NEGATIVE
KETONES UR: NEGATIVE mg/dL
Leukocytes, UA: NEGATIVE
Nitrite: NEGATIVE
PH: 6 (ref 5.0–8.0)
PROTEIN: NEGATIVE mg/dL
SPECIFIC GRAVITY, URINE: 1.001 — AB (ref 1.005–1.030)

## 2016-02-14 LAB — LIPASE, BLOOD: Lipase: 27 U/L (ref 11–51)

## 2016-02-14 LAB — CBC
HEMATOCRIT: 38.3 % (ref 35.0–47.0)
HEMOGLOBIN: 12.3 g/dL (ref 12.0–16.0)
MCH: 27.2 pg (ref 26.0–34.0)
MCHC: 32.2 g/dL (ref 32.0–36.0)
MCV: 84.4 fL (ref 80.0–100.0)
Platelets: 251 10*3/uL (ref 150–440)
RBC: 4.54 MIL/uL (ref 3.80–5.20)
RDW: 17.8 % — AB (ref 11.5–14.5)
WBC: 4.7 10*3/uL (ref 3.6–11.0)

## 2016-02-14 LAB — COMPREHENSIVE METABOLIC PANEL
ALBUMIN: 3.6 g/dL (ref 3.5–5.0)
ALK PHOS: 104 U/L (ref 38–126)
ALT: 40 U/L (ref 14–54)
ANION GAP: 9 (ref 5–15)
AST: 52 U/L — AB (ref 15–41)
BILIRUBIN TOTAL: 1.4 mg/dL — AB (ref 0.3–1.2)
BUN: 11 mg/dL (ref 6–20)
CALCIUM: 9.2 mg/dL (ref 8.9–10.3)
CO2: 23 mmol/L (ref 22–32)
Chloride: 104 mmol/L (ref 101–111)
Creatinine, Ser: 0.79 mg/dL (ref 0.44–1.00)
GFR calc Af Amer: >60 mL/min (ref >60)
GFR calc non Af Amer: >60 mL/min (ref >60)
GLUCOSE: 108 mg/dL — AB (ref 65–99)
Potassium: 4.2 mmol/L (ref 3.5–5.1)
SODIUM: 136 mmol/L (ref 135–145)
Total Protein: 7.9 g/dL (ref 6.5–8.1)

## 2016-02-14 LAB — ETHANOL: Alcohol, Ethyl (B): 221 mg/dL — ABNORMAL HIGH (ref <5)
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
## 2016-02-25 ENCOUNTER — Other Ambulatory Visit: Payer: Self-pay | Admitting: Physician Assistant

## 2016-02-25 ENCOUNTER — Ambulatory Visit
Admission: RE | Admit: 2016-02-25 | Discharge: 2016-02-25 | Disposition: A | Discharge status: Home / Self Care | Payer: Medicaid Other | Source: Ambulatory Visit | Attending: Physician Assistant | Admitting: Physician Assistant

## 2016-02-25 DIAGNOSIS — M25562 Pain in left knee: Secondary | ICD-10-CM

## 2016-02-25 DIAGNOSIS — M7989 Other specified soft tissue disorders: Secondary | ICD-10-CM | POA: Diagnosis present

## 2016-02-25 DIAGNOSIS — M1712 Unilateral primary osteoarthritis, left knee: Secondary | ICD-10-CM | POA: Insufficient documentation

## 2016-03-16 ENCOUNTER — Emergency Department
Admission: EM | Admit: 2016-03-16 | Discharge: 2016-03-16 | Disposition: A | Discharge status: Home / Self Care | Payer: MEDICAID | Attending: Emergency Medicine | Admitting: Emergency Medicine

## 2016-03-16 DIAGNOSIS — F17210 Nicotine dependence, cigarettes, uncomplicated: Secondary | ICD-10-CM | POA: Insufficient documentation

## 2016-03-16 DIAGNOSIS — F329 Major depressive disorder, single episode, unspecified: Secondary | ICD-10-CM | POA: Insufficient documentation

## 2016-03-16 DIAGNOSIS — Z21 Asymptomatic human immunodeficiency virus [HIV] infection status: Secondary | ICD-10-CM | POA: Diagnosis not present

## 2016-03-16 DIAGNOSIS — F10129 Alcohol abuse with intoxication, unspecified: Secondary | ICD-10-CM | POA: Insufficient documentation

## 2016-03-16 DIAGNOSIS — Z79899 Other long term (current) drug therapy: Secondary | ICD-10-CM | POA: Diagnosis not present

## 2016-03-16 DIAGNOSIS — J45909 Unspecified asthma, uncomplicated: Secondary | ICD-10-CM | POA: Diagnosis not present

## 2016-03-16 DIAGNOSIS — Z791 Long term (current) use of non-steroidal anti-inflammatories (NSAID): Secondary | ICD-10-CM | POA: Diagnosis not present

## 2016-03-16 DIAGNOSIS — F1010 Alcohol abuse, uncomplicated: Secondary | ICD-10-CM

## 2016-03-16 LAB — CBC
HEMATOCRIT: 36.1 % (ref 35.0–47.0)
HEMOGLOBIN: 12 g/dL (ref 12.0–16.0)
MCH: 28.3 pg (ref 26.0–34.0)
MCHC: 33.4 g/dL (ref 32.0–36.0)
MCV: 84.7 fL (ref 80.0–100.0)
Platelets: 231 10*3/uL (ref 150–440)
RBC: 4.26 MIL/uL (ref 3.80–5.20)
RDW: 17.8 % — ABNORMAL HIGH (ref 11.5–14.5)
WBC: 7.6 10*3/uL (ref 3.6–11.0)

## 2016-03-16 LAB — URINALYSIS COMPLETE WITH MICROSCOPIC (ARMC ONLY)
BACTERIA UA: NONE SEEN
Bilirubin Urine: NEGATIVE
Glucose, UA: NEGATIVE mg/dL
HGB URINE DIPSTICK: NEGATIVE
Ketones, ur: NEGATIVE mg/dL
LEUKOCYTES UA: NEGATIVE
Nitrite: NEGATIVE
PH: 5 (ref 5.0–8.0)
PROTEIN: NEGATIVE mg/dL
RBC / HPF: NONE SEEN RBC/hpf (ref 0–5)
Specific Gravity, Urine: 1.002 — ABNORMAL LOW (ref 1.005–1.030)

## 2016-03-16 LAB — URINE DRUG SCREEN, QUALITATIVE (ARMC ONLY)
AMPHETAMINES, UR SCREEN: NOT DETECTED
BARBITURATES, UR SCREEN: NOT DETECTED
Benzodiazepine, Ur Scrn: NOT DETECTED
COCAINE METABOLITE, UR ~~LOC~~: NOT DETECTED
Cannabinoid 50 Ng, Ur ~~LOC~~: NOT DETECTED
MDMA (Ecstasy)Ur Screen: NOT DETECTED
METHADONE SCREEN, URINE: NOT DETECTED
OPIATE, UR SCREEN: NOT DETECTED
PHENCYCLIDINE (PCP) UR S: NOT DETECTED
Tricyclic, Ur Screen: NOT DETECTED

## 2016-03-16 LAB — COMPREHENSIVE METABOLIC PANEL
ALBUMIN: 4.2 g/dL (ref 3.5–5.0)
ALT: 36 U/L (ref 14–54)
AST: 41 U/L (ref 15–41)
Alkaline Phosphatase: 86 U/L (ref 38–126)
Anion gap: 9 (ref 5–15)
BUN: 9 mg/dL (ref 6–20)
CHLORIDE: 108 mmol/L (ref 101–111)
CO2: 18 mmol/L — AB (ref 22–32)
Calcium: 9.2 mg/dL (ref 8.9–10.3)
Creatinine, Ser: 0.74 mg/dL (ref 0.44–1.00)
GFR calc Af Amer: >60 mL/min (ref >60)
Glucose, Bld: 96 mg/dL (ref 65–99)
POTASSIUM: 3.8 mmol/L (ref 3.5–5.1)
SODIUM: 135 mmol/L (ref 135–145)
Total Bilirubin: 1.6 mg/dL — ABNORMAL HIGH (ref 0.3–1.2)
Total Protein: 8.1 g/dL (ref 6.5–8.1)

## 2016-03-16 LAB — ETHANOL: ALCOHOL ETHYL (B): 367 mg/dL — AB (ref <5)

## 2016-03-16 NOTE — ED Provider Notes (Signed)
[facility] Emergency Department Provider Note  ____________________________________________  Time seen: Approximately 10:15 AM  I have reviewed the triage vital signs and the nursing notes.   HISTORY  Chief Complaint Alcohol Intoxication    HPI Meghan Jarvis is a 40 y.o. female who comes requesting alcohol detox.  She reports that she has been drinking for some time. She denies ever going through withdrawals, or having seizures. She reports she has been able to stop alcohol the past without experiencing withdrawals. She drank about 80 ounces of beer this morning.  She is decided to alcohol detox to assist in helping her social situation and she argues frequently with her significant other.  She denies any nausea or vomiting. No headache or chills. No tremors, seizure, hallucinations or confusion. She does report she has a history of pain in her left knee, but this is been evaluated and thought to be arthritis. She is set up with primary care doctor tomorrow for follow-up on it.  She also reports that she has had a hoarse voice for the last 3 months, she has reported this to her physician and reports this is unchanged. She continues to have this evaluated as well.  Denies pregnancy.  Does not want to harm herself, or anyone else. Denies any suicidal thoughts. Denies any hallucinations.   Past Medical History  Diagnosis Date  . HIV (human immunodeficiency virus infection) (HCC)   . Depression   . Asthma     There are no active problems to display for this patient.   Past Surgical History  Procedure Laterality Date  . Appendectomy    . Abdominal hysterectomy      Current Outpatient Rx  Name  Route  Sig  Dispense  Refill  . DULoxetine (CYMBALTA) 60 MG capsule   Oral   Take 60 mg by mouth daily.         Marland Kitchen. emtricitabine-tenofovir AF (DESCOVY) 200-25 MG tablet   Oral   Take 1 tablet by mouth daily.         Marland Kitchen. gabapentin (NEURONTIN) 100  MG capsule   Oral   Take 300 mg by mouth 3 (three) times daily.         . meloxicam (MOBIC) 7.5 MG tablet   Oral   Take 15 mg by mouth daily as needed for pain.         . polycarbophil (FIBERCON) 625 MG tablet   Oral   Take 625 mg by mouth daily.         Marland Kitchen. tiZANidine (ZANAFLEX) 2 MG tablet   Oral   Take 2 mg by mouth every 8 (eight) hours as needed for muscle spasms.         . traZODone (DESYREL) 100 MG tablet   Oral   Take 200 mg by mouth at bedtime.         Marland Kitchen. atazanavir-cobicistat (EVOTAZ) 300-150 MG tablet   Oral   Take 1 tablet by mouth daily. Reported on 03/16/2016         . naproxen (NAPROSYN) 500 MG tablet   Oral   Take 1 tablet (500 mg total) by mouth 2 (two) times daily with a meal. Patient not taking: Reported on 03/16/2016   20 tablet   0   . ondansetron (ZOFRAN ODT) 8 MG disintegrating tablet   Oral   Take 1 tablet (8 mg total) by mouth every 8 (eight) hours as needed for nausea or vomiting. Patient not taking: Reported on  03/16/2016   20 tablet   0     Allergies Morphine and related  No family history on file.  Social History Social History  Substance Use Topics  . Smoking status: Current Every Day Smoker -- 1.00 packs/day    Types: Cigarettes  . Smokeless tobacco: Never Used  . Alcohol Use: Yes    Review of Systems Constitutional: No fever/chills Eyes: No visual changes. ENT: No sore throatThough her throat is hoarse for the last 3 months. Cardiovascular: Denies chest pain. Respiratory: Denies shortness of breath. Gastrointestinal: No abdominal pain.  No nausea, no vomiting.  No diarrhea.  No constipation. Genitourinary: Negative for dysuria. Musculoskeletal: Negative for back pain though has chronic pain in the left knee thought to be arthritis. Skin: Negative for rash. Neurological: Negative for headaches, focal weakness or numbness.  10-point ROS otherwise negative.  ____________________________________________   PHYSICAL  EXAM:  VITAL SIGNS: ED Triage Vitals  Enc Vitals Group     BP 03/16/16 0925 113/67 mmHg     Pulse Rate 03/16/16 0925 93     Resp 03/16/16 0925 18     Temp 03/16/16 0925 97.9 F (36.6 C)     Temp Source 03/16/16 0925 Oral     SpO2 03/16/16 0925 98 %     Weight 03/16/16 0925 225 lb (102.059 kg)     Height 03/16/16 0925  (1.676 m)     Head Cir --      Peak Flow --      Pain Score 03/16/16 1004 10     Pain Loc --      Pain Edu? --      Excl. in GC? --    Constitutional: Alert and oriented. Well appearing and in no acute distress. Eyes: Conjunctivae are normal. PERRL. EOMI. Head: Atraumatic. Voice is slightly raspy no trouble swallowing, no obvious neck swelling no distress. No stridor.. Nose: No congestion/rhinnorhea. Mouth/Throat: Mucous membranes are moist.  Oropharynx non-erythematous. Neck: No stridor.   Cardiovascular: Normal rate, regular rhythm. Grossly normal heart sounds.  Good peripheral circulation. Respiratory: Normal respiratory effort.  No retractions. Lungs CTAB. Gastrointestinal: Soft and nontender. No distention. Musculoskeletal: No lower extremity tenderness nor edema except for some mild effusion about the left knee joint, however there is no warmth or tenderness noted. No overlying erythema. Neurologic:  Normal speech and language. No gross focal neurologic deficits are appreciated. No gait instability. Skin:  Skin is warm, dry and intact. No rash noted. Psychiatric: Mood and affect are normal. Speech and behavior are normal.  ____________________________________________   LABS (all labs ordered are listed, but only abnormal results are displayed)  Labs Reviewed  COMPREHENSIVE METABOLIC PANEL - Abnormal; Notable for the following:    CO2 18 (*)    Total Bilirubin 1.6 (*)    All other components within normal limits  ETHANOL - Abnormal; Notable for the following:    Alcohol, Ethyl (B) 367 (*)    All other components within normal limits  CBC -  Abnormal; Notable for the following:    RDW 17.8 (*)    All other components within normal limits  URINALYSIS COMPLETEWITH MICROSCOPIC ([facility] ONLY) - Abnormal; Notable for the following:    Color, Urine COLORLESS (*)    APPearance CLEAR (*)    Specific Gravity, Urine 1.002 (*)    Squamous Epithelial / LPF 0-5 (*)    All other components within normal limits  URINE DRUG SCREEN, QUALITATIVE ([facility] ONLY)   ____________________________________________  EKG  ____________________________________________  RADIOLOGY   ____________________________________________   PROCEDURES  Procedure(s) performed: None  Critical Care performed: No  ____________________________________________   INITIAL IMPRESSION / ASSESSMENT AND PLAN / ED COURSE  Pertinent labs & imaging results that were available during my care of the patient were reviewed by me and considered in my medical decision making (see chart for details).  Patient presents for detox evaluation. After speaking to her about detox, she tells me that she will follow-up with them but wishes to be able to go home first. We will provide her with detox information. No evidence of active withdrawal. She is awake alert and well oriented. She has close follow-up regarding her knee effusion and hoarse voice which seemed to be chronic ongoing conditions.  We will discharge her once she has a appropriate and sober ride home, as she should not drive herself at this time. Careful return precautions including symptoms of withdrawal discussed with the patient as well as careful recommendations for follow-up with [facility] discussed and provided.  ----------------------------------------- 11:24 AM on 03/16/2016 -----------------------------------------  Patient was unwilling to wait and ED bed for ride home. She was ambulatory, in no distress and despite having alcohol level of greater than 300, she walks with stable gait and is able to tell us that she is  going to the waiting room to wait to be picked up. Later, the patient's plan is that she will be driven home by a Emergency planning/management officer.  The patient eloped from the emergency room evidently back to the waiting room to wait for someone to pick her up. I do not believe she was committable, she denied any does are to harm herself, she is ambulatory with stable gait, not having any hallucinations. Drinking alcohol, indeed, but again I find no evidence suggests she is in acute risk to harm herself or anyone else at this time. She has a plan in place to wait for someone to pick her up in the waiting room, otherwise she'll go home with the police. ____________________________________________   FINAL CLINICAL IMPRESSION(S) / ED DIAGNOSES  Final diagnoses:  Alcohol abuse      Sharyn Creamer, MD 03/16/16 1126

## 2016-03-16 NOTE — ED Notes (Signed)
Pt just walked away

## 2016-03-16 NOTE — ED Notes (Signed)
She is sitting int he lobby talking with [employer] Officer Vear Clockhillips - pt is calm stating  "I just want to go lay down - I know I walked away but I need them discharge papers so that she know I was really here."  [employer] officer Vear Clockhillips reports that at 1200 if the pt can remain calm in the lobby then he will transport her home

## 2016-03-16 NOTE — Discharge Instructions (Signed)
You have been seen in the Emergency Department (ED) today for wanting detox, but noted you want to go home first and then follow-up today.    Please return to the ED immediately if you have ANY thoughts of hurting yourself or anyone else, so that we may help you.  Please avoid alcohol and drug use. Follow-up with residential treatment services today. Do not drive today or any time when you've been using alcohol.  Follow up with your doctor and/or therapist as soon as possible regarding today's ED visit.   Please follow up any other recommendations and clinic appointments provided by the psychiatry team that saw you in the Emergency Department.   Alcohol Use Disorder Alcohol use disorder is a mental disorder. It is not a one-time incident of heavy drinking. Alcohol use disorder is the excessive and uncontrollable use of alcohol over time that leads to problems with functioning in one or more areas of daily living. People with this disorder risk harming themselves and others when they drink to excess. Alcohol use disorder also can cause other mental disorders, such as mood and anxiety disorders, and serious physical problems. People with alcohol use disorder often misuse other drugs.  Alcohol use disorder is common and widespread. Some people with this disorder drink alcohol to cope with or escape from negative life events. Others drink to relieve chronic pain or symptoms of mental illness. People with a family history of alcohol use disorder are at higher risk of losing control and using alcohol to excess.  Drinking too much alcohol can cause injury, accidents, and health problems. One drink can be too much when you are:  Working.  Pregnant or breastfeeding.  Taking medicines. Ask your doctor.  Driving or planning to drive. SYMPTOMS  Signs and symptoms of alcohol use disorder may include the following:   Consumption ofalcohol inlarger amounts or over a longer period of time than  intended.  Multiple unsuccessful attempts to cutdown or control alcohol use.   A great deal of time spent obtaining alcohol, using alcohol, or recovering from the effects of alcohol (hangover).  A strong desire or urge to use alcohol (cravings).   Continued use of alcohol despite problems at work, school, or home because of alcohol use.   Continued use of alcohol despite problems in relationships because of alcohol use.  Continued use of alcohol in situations when it is physically hazardous, such as driving a car.  Continued use of alcohol despite awareness of a physical or psychological problem that is likely related to alcohol use. Physical problems related to alcohol use can involve the brain, heart, liver, stomach, and intestines. Psychological problems related to alcohol use include intoxication, depression, anxiety, psychosis, delirium, and dementia.   The need for increased amounts of alcohol to achieve the same desired effect, or a decreased effect from the consumption of the same amount of alcohol (tolerance).  Withdrawal symptoms upon reducing or stopping alcohol use, or alcohol use to reduce or avoid withdrawal symptoms. Withdrawal symptoms include:  Racing heart.  Hand tremor.  Difficulty sleeping.  Nausea.  Vomiting.  Hallucinations.  Restlessness.  Seizures. DIAGNOSIS Alcohol use disorder is diagnosed through an assessment by your health care provider. Your health care provider may start by asking three or four questions to screen for excessive or problematic alcohol use. To confirm a diagnosis of alcohol use disorder, at least two symptoms must be present within a 48-month period. The severity of alcohol use disorder depends on the number of symptoms:  Mild--two or three.  Moderate--four or five.  Severe--six or more. Your health care provider may perform a physical exam or use results from lab tests to see if you have physical problems resulting from  alcohol use. Your health care provider may refer you to a mental health professional for evaluation. TREATMENT  Some people with alcohol use disorder are able to reduce their alcohol use to low-risk levels. Some people with alcohol use disorder need to quit drinking alcohol. When necessary, mental health professionals with specialized training in substance use treatment can help. Your health care provider can help you decide how severe your alcohol use disorder is and what type of treatment you need. The following forms of treatment are available:   Detoxification. Detoxification involves the use of prescription medicines to prevent alcohol withdrawal symptoms in the first week after quitting. This is important for people with a history of symptoms of withdrawal and for heavy drinkers who are likely to have withdrawal symptoms. Alcohol withdrawal can be dangerous and, in severe cases, cause death. Detoxification is usually provided in a hospital or in-patient substance use treatment facility.  Counseling or talk therapy. Talk therapy is provided by substance use treatment counselors. It addresses the reasons people use alcohol and ways to keep them from drinking again. The goals of talk therapy are to help people with alcohol use disorder find healthy activities and ways to cope with life stress, to identify and avoid triggers for alcohol use, and to handle cravings, which can cause relapse.  Medicines.Different medicines can help treat alcohol use disorder through the following actions:  Decrease alcohol cravings.  Decrease the positive reward response felt from alcohol use.  Produce an uncomfortable physical reaction when alcohol is used (aversion therapy).  Support groups. Support groups are run by people who have quit drinking. They provide emotional support, advice, and guidance. These forms of treatment are often combined. Some people with alcohol use disorder benefit from intensive  combination treatment provided by specialized substance use treatment centers. Both inpatient and outpatient treatment programs are available.   This information is not intended to replace advice given to you by your health care provider. Make sure you discuss any questions you have with your health care provider.   Document Released: 01/04/2005 Document Revised: 12/18/2014 Document Reviewed: 03/06/2013 Elsevier Interactive Patient Education [employer]

## 2016-03-16 NOTE — ED Notes (Signed)
She has come to the desk - three times - pt requesting to leave  - MD Quale aware

## 2016-03-16 NOTE — ED Notes (Signed)
Pt arrives here via [facility] from home  Pt ambulatory upon arrival and she is requesting detox from alcohol  Pt denies any illicit drug use but she is an everyday cigarette smoker She last drank 2 40ounce beers this am prior to calling EMS   Drink of choice is beer  She has used the phone two times during triage and it appears that she came here due to pressure to get sober by her partner

## 2016-03-16 NOTE — ED Notes (Signed)
MD informed of critical etoh result -  Pt requesting detox upon arrival - declining during MD assessment   MD to discharge her to sober adult  - I have given her - her phone to call to get a sober ride - pt can be heard yelling on the phone at her partner

## 2016-03-16 NOTE — ED Notes (Signed)
Pt reports that her ride will be here in 20 minutes - - I encouraged her to rest

## 2016-05-09 ENCOUNTER — Other Ambulatory Visit: Payer: Self-pay | Admitting: Physician Assistant

## 2016-05-09 DIAGNOSIS — M2392 Unspecified internal derangement of left knee: Secondary | ICD-10-CM

## 2016-05-17 ENCOUNTER — Encounter (HOSPITAL_COMMUNITY): Payer: Self-pay

## 2016-05-17 ENCOUNTER — Emergency Department
Admission: EM | Admit: 2016-05-17 | Discharge: 2016-05-17 | Payer: MEDICAID | Attending: Emergency Medicine | Admitting: Emergency Medicine

## 2016-05-17 ENCOUNTER — Observation Stay (HOSPITAL_COMMUNITY)
Admission: AD | Admit: 2016-05-17 | Discharge: 2016-05-18 | Disposition: A | Discharge status: Home / Self Care | Payer: MEDICAID | Source: Intra-hospital | Attending: Psychiatry | Admitting: Psychiatry

## 2016-05-17 ENCOUNTER — Encounter: Payer: Self-pay | Admitting: Emergency Medicine

## 2016-05-17 DIAGNOSIS — Z888 Allergy status to other drugs, medicaments and biological substances status: Secondary | ICD-10-CM | POA: Insufficient documentation

## 2016-05-17 DIAGNOSIS — F17210 Nicotine dependence, cigarettes, uncomplicated: Secondary | ICD-10-CM | POA: Insufficient documentation

## 2016-05-17 DIAGNOSIS — F329 Major depressive disorder, single episode, unspecified: Secondary | ICD-10-CM | POA: Diagnosis not present

## 2016-05-17 DIAGNOSIS — Z811 Family history of alcohol abuse and dependence: Secondary | ICD-10-CM | POA: Diagnosis not present

## 2016-05-17 DIAGNOSIS — Y900 Blood alcohol level of less than 20 mg/100 ml: Secondary | ICD-10-CM | POA: Diagnosis not present

## 2016-05-17 DIAGNOSIS — R945 Abnormal results of liver function studies: Secondary | ICD-10-CM | POA: Diagnosis not present

## 2016-05-17 DIAGNOSIS — K5900 Constipation, unspecified: Secondary | ICD-10-CM | POA: Insufficient documentation

## 2016-05-17 DIAGNOSIS — F10229 Alcohol dependence with intoxication, unspecified: Principal | ICD-10-CM | POA: Insufficient documentation

## 2016-05-17 DIAGNOSIS — Z21 Asymptomatic human immunodeficiency virus [HIV] infection status: Secondary | ICD-10-CM | POA: Diagnosis not present

## 2016-05-17 DIAGNOSIS — Z885 Allergy status to narcotic agent status: Secondary | ICD-10-CM | POA: Insufficient documentation

## 2016-05-17 DIAGNOSIS — M659 Synovitis and tenosynovitis, unspecified: Secondary | ICD-10-CM | POA: Insufficient documentation

## 2016-05-17 DIAGNOSIS — J45909 Unspecified asthma, uncomplicated: Secondary | ICD-10-CM | POA: Insufficient documentation

## 2016-05-17 DIAGNOSIS — F10239 Alcohol dependence with withdrawal, unspecified: Secondary | ICD-10-CM | POA: Insufficient documentation

## 2016-05-17 DIAGNOSIS — Z91013 Allergy to seafood: Secondary | ICD-10-CM | POA: Diagnosis not present

## 2016-05-17 DIAGNOSIS — F1020 Alcohol dependence, uncomplicated: Secondary | ICD-10-CM

## 2016-05-17 DIAGNOSIS — M62838 Other muscle spasm: Secondary | ICD-10-CM | POA: Insufficient documentation

## 2016-05-17 DIAGNOSIS — R7989 Other specified abnormal findings of blood chemistry: Secondary | ICD-10-CM

## 2016-05-17 DIAGNOSIS — F1010 Alcohol abuse, uncomplicated: Secondary | ICD-10-CM | POA: Diagnosis present

## 2016-05-17 LAB — CBC WITH DIFFERENTIAL/PLATELET
BASOS ABS: 0 10*3/uL (ref 0–0.1)
Basophils Relative: 1 %
EOS ABS: 0 10*3/uL (ref 0–0.7)
EOS PCT: 0 %
HCT: 39.6 % (ref 35.0–47.0)
Hemoglobin: 13.1 g/dL (ref 12.0–16.0)
Lymphocytes Relative: 19 %
Lymphs Abs: 1.2 10*3/uL (ref 1.0–3.6)
MCH: 29 pg (ref 26.0–34.0)
MCHC: 33.1 g/dL (ref 32.0–36.0)
MCV: 87.5 fL (ref 80.0–100.0)
Monocytes Absolute: 0.3 10*3/uL (ref 0.2–0.9)
Monocytes Relative: 5 %
Neutro Abs: 4.8 10*3/uL (ref 1.4–6.5)
Neutrophils Relative %: 75 %
PLATELETS: 252 10*3/uL (ref 150–440)
RBC: 4.52 MIL/uL (ref 3.80–5.20)
RDW: 17.2 % — ABNORMAL HIGH (ref 11.5–14.5)
WBC: 6.3 10*3/uL (ref 3.6–11.0)

## 2016-05-17 LAB — COMPREHENSIVE METABOLIC PANEL
ALT: 66 U/L — AB (ref 14–54)
AST: 73 U/L — AB (ref 15–41)
Albumin: 4.4 g/dL (ref 3.5–5.0)
Alkaline Phosphatase: 69 U/L (ref 38–126)
Anion gap: 12 (ref 5–15)
BILIRUBIN TOTAL: 2.8 mg/dL — AB (ref 0.3–1.2)
BUN: 12 mg/dL (ref 6–20)
CALCIUM: 9.6 mg/dL (ref 8.9–10.3)
CO2: 21 mmol/L — ABNORMAL LOW (ref 22–32)
CREATININE: 0.85 mg/dL (ref 0.44–1.00)
Chloride: 104 mmol/L (ref 101–111)
GFR calc Af Amer: >60 mL/min (ref >60)
Glucose, Bld: 172 mg/dL — ABNORMAL HIGH (ref 65–99)
Potassium: 3.8 mmol/L (ref 3.5–5.1)
Sodium: 137 mmol/L (ref 135–145)
TOTAL PROTEIN: 8.6 g/dL — AB (ref 6.5–8.1)

## 2016-05-17 LAB — ETHANOL: Alcohol, Ethyl (B): <5 mg/dL (ref <5)

## 2016-05-17 MED ORDER — TIZANIDINE HCL 2 MG PO TABS
2.0000 mg | ORAL_TABLET | Freq: Three times a day (TID) | ORAL | Status: DC | PRN
  Administered 2016-05-17: 2 mg via ORAL
  Filled 2016-05-17 (×3): qty 1

## 2016-05-17 MED ORDER — TRAZODONE HCL 100 MG PO TABS
200.0000 mg | ORAL_TABLET | Freq: Every day | ORAL | Status: DC
  Administered 2016-05-17: 200 mg via ORAL
  Filled 2016-05-17: qty 2

## 2016-05-17 MED ORDER — PNEUMOCOCCAL VAC POLYVALENT 25 MCG/0.5ML IJ INJ: 0.5000 mL | INJECTION | INTRAMUSCULAR | Status: DC

## 2016-05-17 NOTE — Discharge Instructions (Signed)
You are going to [facility] for treatment of your alcohol dependence. It is very important that he follow up with your primary care doctor for your elevated liver tests. Return to the ER for thoughts of wanting to hurt herself or for any other concerns.   Alcohol Use Disorder Alcohol use disorder is a mental disorder. It is not a one-time incident of heavy drinking. Alcohol use disorder is the excessive and uncontrollable use of alcohol over time that leads to problems with functioning in one or more areas of daily living. People with this disorder risk harming themselves and others when they drink to excess. Alcohol use disorder also can cause other mental disorders, such as mood and anxiety disorders, and serious physical problems. People with alcohol use disorder often misuse other drugs.  Alcohol use disorder is common and widespread. Some people with this disorder drink alcohol to cope with or escape from negative life events. Others drink to relieve chronic pain or symptoms of mental illness. People with a family history of alcohol use disorder are at higher risk of losing control and using alcohol to excess.  Drinking too much alcohol can cause injury, accidents, and health problems. One drink can be too much when you are:  Working.  Pregnant or breastfeeding.  Taking medicines. Ask your doctor.  Driving or planning to drive. SYMPTOMS  Signs and symptoms of alcohol use disorder may include the following:   Consumption ofalcohol inlarger amounts or over a longer period of time than intended.  Multiple unsuccessful attempts to cutdown or control alcohol use.   A great deal of time spent obtaining alcohol, using alcohol, or recovering from the effects of alcohol (hangover).  A strong desire or urge to use alcohol (cravings).   Continued use of alcohol despite problems at work, school, or home because of alcohol use.   Continued use of alcohol despite  problems in relationships because of alcohol use.  Continued use of alcohol in situations when it is physically hazardous, such as driving a car.  Continued use of alcohol despite awareness of a physical or psychological problem that is likely related to alcohol use. Physical problems related to alcohol use can involve the brain, heart, liver, stomach, and intestines. Psychological problems related to alcohol use include intoxication, depression, anxiety, psychosis, delirium, and dementia.   The need for increased amounts of alcohol to achieve the same desired effect, or a decreased effect from the consumption of the same amount of alcohol (tolerance).  Withdrawal symptoms upon reducing or stopping alcohol use, or alcohol use to reduce or avoid withdrawal symptoms. Withdrawal symptoms include:  Racing heart.  Hand tremor.  Difficulty sleeping.  Nausea.  Vomiting.  Hallucinations.  Restlessness.  Seizures. DIAGNOSIS Alcohol use disorder is diagnosed through an assessment by your health care provider. Your health care provider may start by asking three or four questions to screen for excessive or problematic alcohol use. To confirm a diagnosis of alcohol use disorder, at least two symptoms must be present within a 31-month period. The severity of alcohol use disorder depends on the number of symptoms:  Mild--two or three.  Moderate--four or five.  Severe--six or more. Your health care provider may perform a physical exam or use results from lab tests to see if you have physical problems resulting from alcohol use. Your health care provider may refer you to a mental health professional for evaluation. TREATMENT  Some people with alcohol use disorder are able to reduce their alcohol  use to low-risk levels. Some people with alcohol use disorder need to quit drinking alcohol. When necessary, mental health professionals with specialized training in substance use treatment can help. Your  health care provider can help you decide how severe your alcohol use disorder is and what type of treatment you need. The following forms of treatment are available:   Detoxification. Detoxification involves the use of prescription medicines to prevent alcohol withdrawal symptoms in the first week after quitting. This is important for people with a history of symptoms of withdrawal and for heavy drinkers who are likely to have withdrawal symptoms. Alcohol withdrawal can be dangerous and, in severe cases, cause death. Detoxification is usually provided in a hospital or in-patient substance use treatment facility.  Counseling or talk therapy. Talk therapy is provided by substance use treatment counselors. It addresses the reasons people use alcohol and ways to keep them from drinking again. The goals of talk therapy are to help people with alcohol use disorder find healthy activities and ways to cope with life stress, to identify and avoid triggers for alcohol use, and to handle cravings, which can cause relapse.  Medicines.Different medicines can help treat alcohol use disorder through the following actions:  Decrease alcohol cravings.  Decrease the positive reward response felt from alcohol use.  Produce an uncomfortable physical reaction when alcohol is used (aversion therapy).  Support groups. Support groups are run by people who have quit drinking. They provide emotional support, advice, and guidance. These forms of treatment are often combined. Some people with alcohol use disorder benefit from intensive combination treatment provided by specialized substance use treatment centers. Both inpatient and outpatient treatment programs are available.   This information is not intended to replace advice given to you by your health care provider. Make sure you discuss any questions you have with your health care provider.   Document Released: 01/04/2005 Document Revised: 12/18/2014 Document Reviewed:  03/06/2013 Elsevier Interactive Patient Education [employer]

## 2016-05-17 NOTE — BH Assessment (Signed)
Assessment Note  Meghan Jarvis is an 40 y.o. female who presents to the ER seeking to detox from alcohol. She states she drinks on a daily basis and the amount varies. She admits to drinking 4-40oz beers to a case of beers. Symptoms of withdrawal are; tired, weak, shakes, cold chills, sweats and some nausea. She denies history of seizures and blackouts.  She also denies SI/HI and AV/H. She denies having a history of violence and aggression.  She have an upcoming court date 05/26/2016 and charged Misdemeanor INJURY TO REAL PROPERTY.   Diagnosis: Alcohol Use Disorder, Severe  Past Medical History:  Past Medical History  Diagnosis Date  . HIV (human immunodeficiency virus infection) (HCC)   . Depression   . Asthma     Past Surgical History  Procedure Laterality Date  . Appendectomy    . Abdominal hysterectomy      Family History: History reviewed. No pertinent family history.  Social History:  reports that she has been smoking Cigarettes.  She has been smoking about 1.00 pack per day. She has never used smokeless tobacco. She reports that she drinks alcohol. She reports that she does not use illicit drugs.  Additional Social History:  Alcohol / Drug Use Pain Medications: See PTA Prescriptions: See PTA Over the Counter: See PTA History of alcohol / drug use?: Yes Longest period of sobriety (when/how long): Unable to quatify Withdrawal Symptoms: Tremors, Weakness, Nausea / Vomiting (Tired, weak, shakes, cold chills, sweats and some nausea.) Substance #1 Name of Substance 1: Alcohol 1 - Age of First Use: 16 1 - Amount (size/oz): "Sometimes a case  and sometimes 4-40oz 1 - Frequency: Daily 1 - Duration: "All my life." 1 - Last Use / Amount: 05/16/2016  CIWA: CIWA-Ar BP: (!) 134/94 mmHg Pulse Rate: 91 COWS:    Allergies:  Allergies  Allergen Reactions  . Morphine And Related Itching    Home Medications:  (Not in a hospital admission)  OB/GYN Status:  No LMP recorded.  Patient has had a hysterectomy.  General Assessment Data [facility] Assessment: In system Is this a Tele or Face-to-Face Assessment?: Face-to-Face Is this an Initial Assessment or a Re-assessment for this encounter?: Initial Assessment Marital status: Single Maiden name: Yetta BarreJones Is patient pregnant?: No Pregnancy Status: No Living Arrangements: Non-relatives/Friends Can pt return to current living arrangement?: Yes Admission Status: Voluntary Is patient capable of signing voluntary admission?: Yes Referral Source: Self/Family/Friend Insurance type: Medicaid  Medical Screening Exam [facility] Walk-in ONLY) Medical Exam completed: Yes  Crisis Care Plan Living Arrangements: Non-relatives/Friends Legal Guardian: Other: Name of Psychiatrist: Dr. Suzie PortelaMoffitt ([facility] (with [facility] for 2 years)) Name of Therapist: Reports of none  Education Status Is patient currently in school?: No Current Grade: n/a Highest grade of school patient has completed: High School Diploma Name of school: n/a Contact person: n/a  Risk to self with the past 6 months Suicidal Ideation: No Has patient been a risk to self within the past 6 months prior to admission? : No Suicidal Intent: No Has patient had any suicidal intent within the past 6 months prior to admission? : No Is patient at risk for suicide?: No Suicidal Plan?: No Has patient had any suicidal plan within the past 6 months prior to admission? : No Access to Means: No What has been your use of drugs/alcohol within the last 12 months?: Alcohol Previous Attempts/Gestures: Yes How many times?: 1 Other Self Harm Risks: Active Addiction Triggers for Past Attempts: Other (  Comment), Family contact (Drinking) Intentional Self Injurious Behavior: None Family Suicide History: No Recent stressful life event(s): Other (Comment) (Active Addiction ) Persecutory voices/beliefs?: No Depression: Yes Depression Symptoms: Feeling angry/irritable,  Fatigue, Tearfulness, Isolating, Loss of interest in usual pleasures, Feeling worthless/self pity Substance abuse history and/or treatment for substance abuse?: Yes Suicide prevention information given to non-admitted patients: Not applicable  Risk to Others within the past 6 months Homicidal Ideation: No Does patient have any lifetime risk of violence toward others beyond the six months prior to admission? : No Thoughts of Harm to Others: No Current Homicidal Intent: No Current Homicidal Plan: No Access to Homicidal Means: No Identified Victim: Reports of none History of harm to others?: No Assessment of Violence: None Noted Violent Behavior Description: Reports of none Does patient have access to weapons?: No Criminal Charges Pending?: No Does patient have a court date: No Is patient on probation?: No  Psychosis Hallucinations: None noted Delusions: None noted  Mental Status Report Appearance/Hygiene: Other (Comment) (In own clothes) Eye Contact: Good Motor Activity: Freedom of movement, Unremarkable Speech: Logical/coherent, Soft, Unremarkable Level of Consciousness: Alert Mood: Depressed, Sad, Pleasant Affect: Appropriate to circumstance Anxiety Level: None Thought Processes: Coherent, Relevant Judgement: Partial Orientation: Person, Place, Time, Situation, Appropriate for developmental age Obsessive Compulsive Thoughts/Behaviors: Minimal  Cognitive Functioning Concentration: Normal Memory: Recent Intact, Remote Intact IQ: Average Insight: Poor Impulse Control: Poor Appetite: Fair Weight Loss: 0 Weight Gain: 0 Sleep: Decreased Total Hours of Sleep: 4 Vegetative Symptoms: None  ADLScreening [facility] Assessment Services) Patient's cognitive ability adequate to safely complete daily activities?: Yes Patient able to express need for assistance with ADLs?: Yes Independently performs ADLs?: Yes (appropriate for developmental age)  Prior Inpatient Therapy Prior  Inpatient Therapy: Yes Prior Therapy Dates: Int he late 1990's "I was in my 20's" Prior Therapy Facilty/Provider(s): "I can't remember" Reason for Treatment: Depression and Suicide Attempt   Prior Outpatient Therapy Prior Outpatient Therapy: Yes Prior Therapy Dates: Currently Prior Therapy Facilty/Provider(s): [facility] Reason for Treatment: Depression Does patient have an ACCT team?: No Does patient have Intensive In-House Services?  : No Does patient have [facility] services? : No Does patient have P4CC services?: No  ADL Screening (condition at time of admission) Patient's cognitive ability adequate to safely complete daily activities?: Yes Is the patient deaf or have difficulty hearing?: No Does the patient have difficulty seeing, even when wearing glasses/contacts?: No Does the patient have difficulty concentrating, remembering, or making decisions?: No Patient able to express need for assistance with ADLs?: Yes Does the patient have difficulty dressing or bathing?: No Independently performs ADLs?: Yes (appropriate for developmental age) Does the patient have difficulty walking or climbing stairs?: No Weakness of Legs: None Weakness of Arms/Hands: None  Home Assistive Devices/Equipment Home Assistive Devices/Equipment: None  Therapy Consults (therapy consults require a physician order) PT Evaluation Needed: No OT Evalulation Needed: No SLP Evaluation Needed: No Abuse/Neglect Assessment (Assessment to be complete while patient is alone) Physical Abuse: Denies Verbal Abuse: Denies Sexual Abuse: Denies Exploitation of patient/patient's resources: Denies Self-Neglect: Denies Values / Beliefs Cultural Requests During Hospitalization: None Spiritual Requests During Hospitalization: None Consults Spiritual Care Consult Needed: No Social Work Consult Needed: No      Additional Information 1:1 In Past 12 Months?: No CIRT Risk: No Elopement Risk: No Does patient have medical  clearance?: Yes  Child/Adolescent Assessment Running Away Risk: Denies (Patient is an adult)  Disposition:  Disposition Initial Assessment Completed for this Encounter: Yes Disposition of Patient: Referred to  Patient referred to: [facility] Facilities manager)  On Site Evaluation by:   Reviewed with Physician:    Lilyan Gilford MS, LCAS, LPC, NCC, CCSI Therapeutic Triage Specialist 05/17/2016 11:30 AM

## 2016-05-17 NOTE — ED Notes (Signed)

## 2016-05-17 NOTE — BH Assessment (Signed)
Writer discussed with the patient about the option of [facility]. Patient was in the agreement with the plan. Patient signed the Release of information, in order to send labs and ER Note. Information sent sent to [facility] via fax and confirmed it was received.  Patient was declined due to [facility] not being able to take her medicaid.

## 2016-05-17 NOTE — ED Notes (Signed)
BEHAVIORAL HEALTH ROUNDING Patient sleeping: No. Patient alert and oriented: yes Behavior appropriate: Yes.  ; If no, describe:  Nutrition and fluids offered: yes  Sprite also Toileting and hygiene offered: Yes  Sitter present: q15 minute observations and security monitoring Law enforcement present: Yes  ODS

## 2016-05-17 NOTE — ED Notes (Addendum)
Pt to ed with c/o wanting to detox from alcohol.  States last use was last night, denies si, denies HI,  Denies hallucinations

## 2016-05-17 NOTE — ED Provider Notes (Signed)
[facility] Emergency Department Provider Note ____________________________________________  Time seen: Approximately 11:44 AM  I have reviewed the triage vital signs and the nursing notes.   HISTORY  Chief Complaint Alcohol Problem   HPI Meghan Jarvis is a 40 y.o. female with history of HIV, depression, and asthma who presents with alcohol dependence since age 40 who is seeking help to detox. Her last drink was 9:51 PM last night. She has never done detox before. She denies any recent physical illness and is not currently experiencing any pain. She has lost 5 pounds due to only drinking and not eating anything. She states she is just tired of drinking so much. She denies any SI.  Past Medical History  Diagnosis Date  . HIV (human immunodeficiency virus infection) (HCC)   . Depression   . Asthma     There are no active problems to display for this patient.   Past Surgical History  Procedure Laterality Date  . Appendectomy    . Abdominal hysterectomy      Current Outpatient Rx  Name  Route  Sig  Dispense  Refill  . atazanavir-cobicistat (EVOTAZ) 300-150 MG tablet   Oral   Take 1 tablet by mouth daily. Reported on 03/16/2016         . DULoxetine (CYMBALTA) 60 MG capsule   Oral   Take 60 mg by mouth daily.         Marland Kitchen. emtricitabine-tenofovir AF (DESCOVY) 200-25 MG tablet   Oral   Take 1 tablet by mouth daily.         Marland Kitchen. gabapentin (NEURONTIN) 100 MG capsule   Oral   Take 300 mg by mouth 3 (three) times daily.         . meloxicam (MOBIC) 7.5 MG tablet   Oral   Take 15 mg by mouth daily as needed for pain.         . naproxen (NAPROSYN) 500 MG tablet   Oral   Take 1 tablet (500 mg total) by mouth 2 (two) times daily with a meal. Patient not taking: Reported on 03/16/2016   20 tablet   0   . ondansetron (ZOFRAN ODT) 8 MG disintegrating tablet   Oral   Take 1 tablet (8 mg total) by mouth every 8 (eight) hours as needed for nausea or  vomiting. Patient not taking: Reported on 03/16/2016   20 tablet   0   . polycarbophil (FIBERCON) 625 MG tablet   Oral   Take 625 mg by mouth daily.         Marland Kitchen. tiZANidine (ZANAFLEX) 2 MG tablet   Oral   Take 2 mg by mouth every 8 (eight) hours as needed for muscle spasms.         . traZODone (DESYREL) 100 MG tablet   Oral   Take 200 mg by mouth at bedtime.           Allergies Morphine and related  History reviewed. No pertinent family history.  Social History Social History  Substance Use Topics  . Smoking status: Current Every Day Smoker -- 1.00 packs/day    Types: Cigarettes  . Smokeless tobacco: Never Used  . Alcohol Use: Yes    Review of Systems Constitutional: No fever/chills Eyes: No visual changes. ENT: No sore throat. Cardiovascular: Denies chest pain. Respiratory: Denies shortness of breath. Gastrointestinal: No abdominal pain.  No nausea, no vomiting.  No diarrhea.  No constipation. Genitourinary: Negative for dysuria. Musculoskeletal: Negative for  back pain. Skin: Negative for rash. Neurological: Negative for headaches, focal weakness or numbness.  10-point ROS otherwise negative.  ____________________________________________   PHYSICAL EXAM:  VITAL SIGNS: ED Triage Vitals  Enc Vitals Group     BP 05/17/16 0928 134/94 mmHg     Pulse Rate 05/17/16 0928 91     Resp 05/17/16 0928 16     Temp 05/17/16 0928 98.5 F (36.9 C)     Temp Source 05/17/16 0928 Oral     SpO2 05/17/16 0928 98 %     Weight 05/17/16 0928 245 lb (111.131 kg)     Height 05/17/16 0928  (1.676 m)     Head Cir --      Peak Flow --      Pain Score 05/17/16 0929 6     Pain Loc --      Pain Edu? --      Excl. in GC? --    Constitutional: Alert and oriented. Well appearing and in no acute distress. Eyes: Conjunctivae are normal. PERRL. EOMI. Head: Atraumatic. Nose: No congestion/rhinnorhea. Mouth/Throat: Mucous membranes are moist.  Oropharynx non-erythematous.  Poor dentition, missing multiple teeth Neck: No stridor.   Cardiovascular: Normal rate, regular rhythm. Grossly normal heart sounds.  Good peripheral circulation. Respiratory: Normal respiratory effort.  No retractions. Lungs CTAB. Gastrointestinal: Soft and nontender. No distention. No abdominal bruits. No CVA tenderness. Musculoskeletal: No lower extremity tenderness nor edema.   Neurologic:  Normal speech and language. No gross focal neurologic deficits are appreciated. No gait instability. Skin:  Skin is warm, dry and intact. No rash noted. Psychiatric: Mood and affect are normal. Speech and behavior are normal.  ____________________________________________   LABS (all labs ordered are listed, but only abnormal results are displayed)  Labs Reviewed  COMPREHENSIVE METABOLIC PANEL - Abnormal; Notable for the following:    CO2 21 (*)    Glucose, Bld 172 (*)    Total Protein 8.6 (*)    AST 73 (*)    ALT 66 (*)    Total Bilirubin 2.8 (*)    All other components within normal limits  CBC WITH DIFFERENTIAL/PLATELET - Abnormal; Notable for the following:    RDW 17.2 (*)    All other components within normal limits  ETHANOL   ____________________________________________ ____________________________________________   INITIAL IMPRESSION / ASSESSMENT AND PLAN / ED COURSE  Pertinent labs & imaging results that were available during my care of the patient were reviewed by me and considered in my medical decision making (see chart for details).  Patient will need outpatient follow-up for elevated LFTs.  ----------------------------------------- 2:39 PM on 05/17/2016 -----------------------------------------  Patient has been accepted to [facility] for 23 hour observation in the behavioral unit. She will be discharged & transported by [facility].  She has been stable throughout her ER stay ____________________________________________   FINAL CLINICAL IMPRESSION(S) / ED  DIAGNOSES  Final diagnoses:  Uncomplicated alcohol dependence (HCC)  Elevated LFTs      New prescriptions started this visit New Prescriptions   No medications on file     Maurilio Lovely, MD 05/17/16 1459

## 2016-05-17 NOTE — ED Notes (Signed)
Pt given lunch tray.

## 2016-05-17 NOTE — BH Assessment (Signed)
Patient has been accepted [facility].  Patient assigned to room Obs bed 5 Accepting physician is Renata CapriceConrad on the behalf of Dr. Adela Glimpseabos.  Call report to [phone] or [phone].  Representative was Lillia AbedLindsay ER Staff is aware of it Elder Love([place], ER Sect.; Dr. Evette CristalMaclaurin, ER MD & Amy T., Patient's Nurse)     Writer contacted [facility] ([phone]) an arranged for pick.

## 2016-05-17 NOTE — Progress Notes (Signed)
Admission Note:   [facility]; she was sent here [facility];  On arrival patient awake an alert; orieinted x 4.  She states mild abd cramping but says that she hadn't eaten; meal tray provided. Patient states that the reason she is here is to get help for Alcohol Abuse.  She says that she drinks 24 twelve ounce beers almost daily and that this has caused problems in her relationship with her partner.  She denies suicidal and homicidal ideation and AVH;  No self-injurious behaviors noted or reported.  Skin check done and witnessed by Jan W.  RN;  No scars, tattoos, rashes or other skin issues noted. Skin warm, dry and intact. Patient and belongings checked for contraband with none found.  Belongings including home medications placed in [facility].  Patient oriented to OBS Unit and plan of care with verbalization of understanding by her.  Assigned to OBS bed 5; Emotional support given and patient encouraged to seek assistance for any needs or concerns. Safety maintained on OBS Unit.

## 2016-05-17 NOTE — ED Notes (Signed)
We continue to await ethanol results - lab notified

## 2016-05-17 NOTE — ED Notes (Signed)
BEHAVIORAL HEALTH ROUNDING Patient sleeping: No. Patient alert and oriented: yes Behavior appropriate: Yes.  ; If no, describe:  Nutrition and fluids offered: yes Toileting and hygiene offered: Yes  Sitter present: q15 minute observations and security  monitoring Law enforcement present: Yes  ODS  

## 2016-05-18 DIAGNOSIS — F1020 Alcohol dependence, uncomplicated: Secondary | ICD-10-CM

## 2016-05-18 DIAGNOSIS — F10229 Alcohol dependence with intoxication, unspecified: Secondary | ICD-10-CM | POA: Diagnosis not present

## 2016-05-18 MED ORDER — MELOXICAM 7.5 MG PO TABS: 15.0000 mg | ORAL_TABLET | Freq: Every day | ORAL | Status: DC | PRN

## 2016-05-18 MED ORDER — CALCIUM POLYCARBOPHIL 625 MG PO TABS: 625.0000 mg | ORAL_TABLET | Freq: Every day | ORAL | Status: DC

## 2016-05-18 MED ORDER — NAPROXEN 500 MG PO TABS: 500.0000 mg | ORAL_TABLET | Freq: Two times a day (BID) | ORAL | Status: DC

## 2016-05-18 MED ORDER — ONDANSETRON 8 MG PO TBDP: 8.0000 mg | ORAL_TABLET | Freq: Three times a day (TID) | ORAL | Status: DC | PRN

## 2016-05-18 MED ORDER — TIZANIDINE HCL 2 MG PO TABS: 2.0000 mg | ORAL_TABLET | Freq: Three times a day (TID) | ORAL | Status: DC | PRN

## 2016-05-18 MED ORDER — DULOXETINE HCL 60 MG PO CPEP: 60.0000 mg | ORAL_CAPSULE | Freq: Every day | ORAL | Status: DC

## 2016-05-18 MED ORDER — EMTRICITABINE-TENOFOVIR AF 200-25 MG PO TABS: 1.0000 | ORAL_TABLET | Freq: Every day | ORAL | Status: DC

## 2016-05-18 MED ORDER — ATAZANAVIR-COBICISTAT 300-150 MG PO TABS: 1.0000 | ORAL_TABLET | Freq: Every day | ORAL | Status: DC

## 2016-05-18 MED ORDER — GABAPENTIN 300 MG PO CAPS
300.0000 mg | ORAL_CAPSULE | Freq: Three times a day (TID) | ORAL | Status: DC
  Administered 2016-05-18: 300 mg via ORAL
  Filled 2016-05-18: qty 1

## 2016-05-18 MED ORDER — ATAZANAVIR-COBICISTAT 300-150 MG PO TABS
1.0000 | ORAL_TABLET | Freq: Every day | ORAL | Status: DC
  Filled 2016-05-18 (×2): qty 1

## 2016-05-18 MED ORDER — DULOXETINE HCL 60 MG PO CPEP
60.0000 mg | ORAL_CAPSULE | Freq: Every day | ORAL | Status: DC
  Administered 2016-05-18: 60 mg via ORAL
  Filled 2016-05-18: qty 1

## 2016-05-18 MED ORDER — GABAPENTIN 300 MG PO CAPS: 300.0000 mg | ORAL_CAPSULE | Freq: Three times a day (TID) | ORAL | Status: DC

## 2016-05-18 MED ORDER — CLOBETASOL PROPIONATE 0.05 % EX OINT: 1.0000 "application " | TOPICAL_OINTMENT | Freq: Two times a day (BID) | CUTANEOUS | Status: DC

## 2016-05-18 MED ORDER — CALCIUM POLYCARBOPHIL 625 MG PO TABS
625.0000 mg | ORAL_TABLET | Freq: Every day | ORAL | Status: DC
  Administered 2016-05-18: 625 mg via ORAL
  Filled 2016-05-18: qty 1

## 2016-05-18 MED ORDER — TRAZODONE HCL 100 MG PO TABS: 200.0000 mg | ORAL_TABLET | Freq: Every day | ORAL | Status: DC

## 2016-05-18 MED ORDER — EMTRICITABINE-TENOFOVIR AF 200-25 MG PO TABS
1.0000 | ORAL_TABLET | Freq: Every day | ORAL | Status: DC
  Administered 2016-05-18: 1 via ORAL
  Filled 2016-05-18 (×2): qty 1

## 2016-05-18 MED ORDER — ONDANSETRON 4 MG PO TBDP: 8.0000 mg | ORAL_TABLET | Freq: Three times a day (TID) | ORAL | Status: DC | PRN

## 2016-05-18 NOTE — Discharge Instructions (Signed)
You are encouraged to follow up with the Residential treatment facilities included in your discharge summary for placement and continuation of care. You are also encouraged to follow up with [facility] today and intake is at 6p.m. As you have been accepted for shelter services. Additionally, you are encouraged to [facility] with [facility] for medication management/ continuation of care and aide in recovery.

## 2016-05-18 NOTE — [facility] Counselor (Signed)
Spoke with patient. Patient given shelter resources and pantry food services along with crisis and hotline numbers. Durante Violett K. Thadd Apuzzo, LCAS-A, LPC-A, NCC  Counselor 05/18/2016 12:00 PM

## 2016-05-18 NOTE — [facility] Counselor (Signed)
Spoke with [facility] [place] [patinet called for shelter services]. Patient has been accepted today at 1800 for intake.Also, patient has been given follow up care and referrals for continuation of care in discharge summary. Mary Hockey K. Raquell Richer, LCAS-A, LPC-A, [facility]  Counselor 05/18/2016 1:25 PM

## 2016-05-18 NOTE — Discharge Summary (Signed)
Physician Discharge Summary Note  Patient:  Meghan Jarvis is an 40 y.o., female  MRN:  [id number]  DOB:  02-23-1976  Patient phone:  [phone] (home)   Patient address:   [address] [place] [address],   Total Time spent with patient: 30 minutes  Date of Admission:  05/17/2016  Date of Discharge: 05-18-16  Reason for Admission:  Alcohol intoxication/withdrawal symptoms.  Principal Problem: Alcohol use disorder, severe, dependence [facility])  Discharge Diagnoses: Patient Active Problem List   Diagnosis Date Noted  . Alcohol use disorder, severe, dependence (HCC) [F10.20] 05/18/2016  . Alcohol abuse [F10.10] 05/17/2016   Past Psychiatric History: Alcoholism, Major depression.  Past Medical History:  Past Medical History  Diagnosis Date  . HIV (human immunodeficiency virus infection) (HCC)   . Depression   . Asthma     Past Surgical History  Procedure Laterality Date  . Appendectomy    . Abdominal hysterectomy     Family History: History reviewed. No pertinent family history.  Family Psychiatric  History: Familial hx. of alcoholism  Social History:  History  Alcohol Use  . 60.0 oz/week  . 100 Cans of beer per week     History  Drug Use No    Social History   Social History  . Marital Status: Single    Spouse Name: N/A  . Number of Children: N/A  . Years of Education: N/A   Social History Main Topics  . Smoking status: Current Every Day Smoker -- 1.00 packs/day    Types: Cigarettes  . Smokeless tobacco: Never Used  . Alcohol Use: 60.0 oz/week    100 Cans of beer per week  . Drug Use: No  . Sexual Activity: Yes    Birth Control/ Protection: Surgical   Other Topics Concern  . None   Social History Narrative   Hospital Course: [name] is a 40 year old African-American female with hx of alcoholism, chronic. She came to the [facility] seeking help for excessive drinking of alcohol. She reports, "The ambulance took me to the [facility]. I'm tired of  drinking. I want to get better. Alcohol makes me voilent such as arguing & getting into fights with people. I drink up to a case of beer & 5 of the 40 ounce bottles of beer everyday". [name] currently denies any SIHI, delusional thoughts or paranoia. However, she states that she does have AVH during her alcohol withdrawal times only. She is HIV positive, denies any other drug use. She reports that she has been drinking since age 54, which worsened in her 75s. [name] is seeking alcohol detox & a treatment program for substance abuse.  After admission assessment, it was determined that [name] was intoxicated & presenting with mild alcohol withdrawal symptoms. It was also decided that she may be at the early stage of her alcohol withdrawal symptoms. She may be as well getting to the end of her alcohol withdrawal symptoms as she was transferred from another hospital. In any case, it was determined to place [name] at the [facility] Observation unit for 24 hours & initiate alcohol detoxification treatment protocols while being closely monitored. A review of her most recent lab reports indicated a BAL of <5 & UDS negative for any other substances. Her recent lab reports showed elevated liver enzymes (AST, ALT) possibly from chronic alcoholism. As a result, Breyah is not a candidate for [name] detoxification treatment protocols. [name] is one of the Benzodiazepine agents used for alcohol/Benzodiazepine detox treatment. However, it has a  long half life. This means it's effects stay longer in the system & not suictable for a compromised liver enzymes. [name] detoxification regimen is not suitable for [name]'s detox treatment because of her elevated liver enzymes.   [name]'s detoxification treatment was achieved using Ativan detox regimen on a tapering dose format. By using Ativan, she received a cleaner detoxification treatment without the lingering adverse effects of the long acting [name]. [name] was also medicated  & discharged on; Duloxetine 60 mg for depression, Gabapentin 300 mg for for agitation/alcohol withdrawal syndrome & Trazodone 200 mg for insomnia. She presented with other pre-existing chronic medical issues that required treatment & monitoring. She was resumed on all her pertinent home medications for those health issues. [name] tolerated her treatment regimen without any adverse effects reported.  [name] 24 hour stay stay in the [facility] Observation unit & the treatments rendered seem helpful & improved her symptoms. A follow-up ssessment this am reveals a refreshed [name] who presents with a good affect, good eye contact, improved mood & absence of substance withdrawal symptoms. She appears medically & mentally stable to be discharged to pursue further substance abuse treatment with the referrals provided for her as noted below. Upon discharge, [name] denies any SIHI, AVH, delusional thoughts, paranoia & or substance withdrawal symptoms. [name] was provided with prescriptions for all her mental health medications. She will remain on all her medications for her other medical issues & follow-up with her medical doctors for all her medical care needs. She left [facility] with all personal belongings in no apparent distress. Transportation per her arrangement.  Physical Findings: AIMS: Facial and Oral Movements Muscles of Facial Expression: None, normal Lips and Perioral Area: None, normal Jaw: None, normal Tongue: None, normal,Extremity Movements Upper (arms, wrists, hands, fingers): None, normal Lower (legs, knees, ankles, toes): None, normal, Trunk Movements Neck, shoulders, hips: None, normal, Overall Severity Severity of abnormal movements (highest score from questions above): None, normal Incapacitation due to abnormal movements: None, normal Patient's awareness of abnormal movements (rate only patient's report): No Awareness, Dental Status Current problems with teeth and/or dentures?:  No Does patient usually wear dentures?: No  CIWA:  CIWA-Ar Total: 1 COWS:  COWS Total Score: 3  Musculoskeletal: Strength & Muscle Tone: within normal limits Gait & Station: normal Patient leans: N/A  Psychiatric Specialty Exam: Physical Exam  Constitutional: She is oriented to person, place, and time. She appears well-developed and well-nourished.  HENT:  Head: Normocephalic.  Eyes: Pupils are equal, round, and reactive to light.  Cardiovascular: Normal rate.   Respiratory: Effort normal.  GI: Soft.  Genitourinary:  Denies any issues in this area  Musculoskeletal: Normal range of motion.  Neurological: She is alert and oriented to person, place, and time.  Skin: Skin is warm and dry.  Psychiatric: Her speech is normal and behavior is normal. Judgment and thought content normal. Her mood appears not anxious. Her affect is not angry, not blunt, not labile and not inappropriate. Cognition and memory are normal. She does not exhibit a depressed mood.    Review of Systems  Constitutional: Negative.   HENT: Negative.   Eyes: Negative.   Respiratory: Negative.   Cardiovascular: Negative.   Gastrointestinal: Negative.   Genitourinary: Negative.   Musculoskeletal: Negative.   Skin: Negative.   Neurological: Negative.   Endo/Heme/Allergies: Negative.   Psychiatric/Behavioral: Positive for depression (Stable), hallucinations (Hx. AVH during alcohol withdrawal) and substance abuse (Alcoholism, chronic). Negative for suicidal ideas and memory loss. The patient has insomnia (Stable). The  patient is not nervous/anxious.     Blood pressure 150/99, pulse 79, temperature 98.6 F (37 C), temperature source Oral, resp. rate 18, height 5\' 6"  (1.676 m), weight 109.77 kg (242 lb), SpO2 100 %.Body mass index is 39.08 kg/(m^2).  See H&P.  Have you used any form of tobacco in the last 30 days? (Cigarettes, Smokeless Tobacco, Cigars, and/or Pipes): Yes  Has this patient used any form of tobacco in  the last 30 days? (Cigarettes, Smokeless Tobacco, Cigars, and/or Pipes) Yes, No  Blood Alcohol level:  Lab Results  Component Value Date   ETH <5 05/17/2016   ETH 367* 03/16/2016    Metabolic Disorder Labs:  No results found for: HGBA1C, MPG No results found for: PROLACTIN No results found for: CHOL, TRIG, HDL, CHOLHDL, VLDL, LDLCALC  See Psychiatric Specialty Exam and Suicide Risk Assessment completed by Attending Physician prior to discharge.  Discharge destination:  Other:  Home with referral to [facility], [facility] & [facility].  Is patient on multiple antipsychotic therapies at discharge:  No   Has Patient had three or more failed trials of antipsychotic monotherapy by history:  No  Recommended Plan for Multiple Antipsychotic Therapies: NA    Medication List    TAKE these medications      Indication   atazanavir-cobicistat 300-150 MG tablet  Commonly known as:  EVOTAZ  Take 1 tablet by mouth daily. Reported on 03/16/2016: HIV infection   Indication:  HIV Disease     clobetasol ointment 0.05 %  Commonly known as:  TEMOVATE  Apply 1 application topically 2 (two) times daily. For eye irritation   Indication:  Inflammation, Itching, Irritation     DULoxetine 60 MG capsule  Commonly known as:  CYMBALTA  Take 1 capsule (60 mg total) by mouth daily. For depression   Indication:  Major Depressive Disorder     emtricitabine-tenofovir AF 200-25 MG tablet  Commonly known as:  DESCOVY  Take 1 tablet by mouth daily. For HIV infection   Indication:  HIV Disease     gabapentin 300 MG capsule  Commonly known as:  NEURONTIN  Take 1 capsule (300 mg total) by mouth 3 (three) times daily. For agitation/alcohol withdrawal syndrome   Indication:  Agitation, Alcohol Withdrawal Syndrome     meloxicam 7.5 MG tablet  Commonly known as:  MOBIC  Take 2 tablets (15 mg total) by mouth daily as needed for pain.   Indication:  Joint Damage causing Pain and Loss of Function     naproxen 500  MG tablet  Commonly known as:  NAPROSYN  Take 1 tablet (500 mg total) by mouth 2 (two) times daily with a meal. For pain   Indication:  Nonspecific Inflammation of Tendon Sheath     ondansetron 8 MG disintegrating tablet  Commonly known as:  ZOFRAN ODT  Take 1 tablet (8 mg total) by mouth every 8 (eight) hours as needed for nausea or vomiting.   Indication:  Nausea     polycarbophil 625 MG tablet  Commonly known as:  FIBERCON  Take 1 tablet (625 mg total) by mouth daily. For constipation   Indication:  Constipation     tiZANidine 2 MG tablet  Commonly known as:  ZANAFLEX  Take 1 tablet (2 mg total) by mouth every 8 (eight) hours as needed for muscle spasms.   Indication:  Muscle Spasticity     traZODone 100 MG tablet  Commonly known as:  DESYREL  Take 2 tablets (200 mg total) by  mouth at bedtime. For sleep   Indication:  Trouble Sleeping       Follow-up Information    Follow up with [facility]. Call in 1 day.   Why:  follow up for placement in residential treatment   Contact information:   [address] [place], [place] [id number]       Call [facility].   Why:  follow up for placement in residential treatment   Contact information:   [address] [place], [place] [phone]      Follow up with [facility]. Call in 1 day.   Why:  follow up for placement in residential treatment   Contact information:   [address],  [place], [place] [address] [phone]      Follow up with [facility]. Go in 1 day.   Why:  continuation of care   Contact information:   [address], [place] [phone]      Follow-up recommendations: Activity:  As tolerated Diet: As recommended by your primary care doctor. Keep all scheduled follow-up appointments as recommended.  Comments: Patient is instructed prior to discharge to: Take all medications as prescribed by his/her mental healthcare provider. Report any adverse effects  and or reactions from the medicines to his/her outpatient provider promptly. Patient has been instructed & cautioned: To not engage in alcohol and or illegal drug use while on prescription medicines. In the event of worsening symptoms, patient is instructed to call the crisis hotline, 911 and or go to the nearest ED for appropriate evaluation and treatment of symptoms. To follow-up with his/her primary care provider for your other medical issues, concerns and or health care needs.   Signed: Sanjuana Kava, NP, PMHNP, FNP-BC 05/19/2016, 12:46 PM

## 2016-05-18 NOTE — H&P (Signed)
[facility] Provider Admission PAA/H&P  Patient Identification: Meghan Jarvis  MRN:  [id number]  Date of Evaluation:  05/18/2016  Chief Complaint:  ALCOHOL USE DISORDER  Principal Diagnosis: Diagnosis:  Alcohol use disorder, severe, dependence [facility])  Patient Active Problem List   Diagnosis Date Noted  . Alcohol abuse [F10.10] 05/17/2016   History of Present Illness: Meghan Jarvis is a 40 year old African-American female with hx of alcoholism, chronic. She came to the [facility] seeking help for excessive drinking of alcohol. She reports, "[facility] ambulance [facility]. I'm tired of drinking. I want to get better. Alcohol makes me voilent such as arguing & getting into fights with people. I drink up to a case of beer & 5 of the 40 ounce bottles of beer everyday". Meghan Jarvis currently denies any SIHI, delusional thoughts or paranoia. However, she states that she does have AVH during her alcohol withdrawal times only. She is HIV positive, denies any other drug use. She reports that she has been drinking since age 3, which worsened in her 54s. Meghan Jarvis is seeking alcohol detox & a treatment program for substance abuse.  Associated Signs/Symptoms:  Depression Symptoms:  depressed mood, insomnia, feelings of worthlessness/guilt, anxiety,  (Hypo) Manic Symptoms:  Denies any hupamic episodes  Anxiety Symptoms:  Excessive Worry,  Psychotic Symptoms:  Denies any hallucinations, delusions or paranoia  PTSD Symptoms: Denies  Total Time spent with patient: 45 minutes  Past Psychiatric History: Alcoholism, chronic  Is the patient at risk to self? No.  Has the patient been a risk to self in the past 6 months? No.  Has the patient been a risk to self within the distant past? No.  Is the patient a risk to others? No.  Has the patient been a risk to others in the past 6 months? No.  Has the patient been a risk to others within the distant past? No.   Prior Inpatient Therapy: No, patient  denies Prior Outpatient Therapy: Yes  Alcohol Screening: 1. How often do you have a drink containing alcohol?: 4 or more times a week 2. How many drinks containing alcohol do you have on a typical day when you are drinking?: 10 or more 3. How often do you have six or more drinks on one occasion?: Daily or almost daily Preliminary Score: 8 4. How often during the last year have you found that you were not able to stop drinking once you had started?: Daily or almost daily 5. How often during the last year have you failed to do what was normally expected from you becasue of drinking?: Daily or almost daily 6. How often during the last year have you needed a first drink in the morning to get yourself going after a heavy drinking session?: Daily or almost daily 7. How often during the last year have you had a feeling of guilt of remorse after drinking?: Weekly 8. How often during the last year have you been unable to remember what happened the night before because you had been drinking?: Weekly 9. Have you or someone else been injured as a result of your drinking?: No 10. Has a relative or friend or a doctor or another health worker been concerned about your drinking or suggested you cut down?: Yes, during the last year Alcohol Use Disorder Identification Test Final Score (AUDIT): 34 Brief Intervention: Yes  Substance Abuse History in the last 12 months:  Yes.    Consequences of Substance Abuse: Medical Consequences:  Liver  damage, Possible death by overdose Legal Consequences:  Arrests, jail time, Loss of driving privilege. Family Consequences:  Family discord, divorce and or separation.   Previous Psychotropic Medications: Yes   Psychological Evaluations: Yes   Past Medical History:  Past Medical History  Diagnosis Date  . HIV (human immunodeficiency virus infection) ([place])   . Depression   . Asthma     Past Surgical History  Procedure Laterality Date  . Appendectomy    . Abdominal  hysterectomy     Family History: History reviewed. No pertinent family history.  Family Psychiatric History: Alcoholism runs in the family.  Tobacco Screening: Does not smoke  Social History:  History  Alcohol Use  . 60.0 oz/week  . 100 Cans of beer per week     History  Drug Use No    Additional Social History: Pain Medications: No abuse Prescriptions: No abuse Over the Counter: No abuse History of alcohol / drug use?: Yes Longest period of sobriety (when/how long): 2 months Negative Consequences of Use: Personal relationships, Financial Withdrawal Symptoms: Other (Comment) Name of Substance 1: Beer 1 - Age of First Use: 16 1 - Amount (size/oz): 24 twelve ounce beers 1 - Frequency: daily 1 - Duration: every day 1 - Last Use / Amount: 05/16/16  Allergies:   Allergies  Allergen Reactions  . Shellfish Allergy Other (See Comments)    Crab legs result in itching  . Buprenorphine Hcl Itching  . Morphine And Related Itching   Lab Results:  Results for orders placed or performed during the hospital encounter of 05/17/16 (from the past 48 hour(s))  Ethanol     Status: None   Collection Time: 05/17/16 12:01 PM  Result Value Ref Range   Alcohol, Ethyl (B) <5 <5 mg/dL    Comment:        LOWEST DETECTABLE LIMIT FOR SERUM ALCOHOL IS 5 mg/dL FOR MEDICAL PURPOSES ONLY   Comprehensive metabolic panel     Status: Abnormal   Collection Time: 05/17/16 12:01 PM  Result Value Ref Range   Sodium 137 135 - 145 mmol/L   Potassium 3.8 3.5 - 5.1 mmol/L   Chloride 104 101 - 111 mmol/L   CO2 21 (L) 22 - 32 mmol/L   Glucose, Bld 172 (H) 65 - 99 mg/dL   BUN 12 6 - 20 mg/dL   Creatinine, Ser 0.85 0.44 - 1.00 mg/dL   Calcium 9.6 8.9 - 10.3 mg/dL   Total Protein 8.6 (H) 6.5 - 8.1 g/dL   Albumin 4.4 3.5 - 5.0 g/dL   AST 73 (H) 15 - 41 U/L   ALT 66 (H) 14 - 54 U/L   Alkaline Phosphatase 69 38 - 126 U/L   Total Bilirubin 2.8 (H) 0.3 - 1.2 mg/dL   GFR calc non Af Amer >60 >60 mL/min    GFR calc Af Amer >60 >60 mL/min    Comment: (NOTE) The eGFR has been calculated using the CKD EPI equation. This calculation has not been validated in all clinical situations. eGFR's persistently <60 mL/min signify possible Chronic Kidney Disease.    Anion gap 12 5 - 15  CBC with Differential     Status: Abnormal   Collection Time: 05/17/16 12:01 PM  Result Value Ref Range   WBC 6.3 3.6 - 11.0 K/uL   RBC 4.52 3.80 - 5.20 MIL/uL   Hemoglobin 13.1 12.0 - 16.0 g/dL   HCT 39.6 35.0 - 47.0 %   MCV 87.5 80.0 - 100.0 fL  MCH 29.0 26.0 - 34.0 pg   MCHC 33.1 32.0 - 36.0 g/dL   RDW 17.2 (H) 11.5 - 14.5 %   Platelets 252 150 - 440 K/uL   Neutrophils Relative % 75 %   Neutro Abs 4.8 1.4 - 6.5 K/uL   Lymphocytes Relative 19 %   Lymphs Abs 1.2 1.0 - 3.6 K/uL   Monocytes Relative 5 %   Monocytes Absolute 0.3 0.2 - 0.9 K/uL   Eosinophils Relative 0 %   Eosinophils Absolute 0.0 0 - 0.7 K/uL   Basophils Relative 1 %   Basophils Absolute 0.0 0 - 0.1 K/uL   Blood Alcohol level:  Lab Results  Component Value Date   ETH <5 05/17/2016   ETH 367* 57/32/2025   Metabolic Disorder Labs:  No results found for: HGBA1C, MPG No results found for: PROLACTIN No results found for: CHOL, TRIG, HDL, CHOLHDL, VLDL, LDLCALC  Current Medications: Current Facility-Administered Medications  Medication Dose Route Frequency Provider Last Rate Last Dose  . atazanavir-cobicistat (Evotaz) 300-150 MG per tablet 1 tablet  1 tablet Oral Daily Encarnacion Slates, NP      . DULoxetine (CYMBALTA) DR capsule 60 mg  60 mg Oral Daily Encarnacion Slates, NP      . emtricitabine-tenofovir AF (DESCOVY) 200-25 MG per tablet 1 tablet  1 tablet Oral Daily Encarnacion Slates, NP      . gabapentin (NEURONTIN) capsule 300 mg  300 mg Oral TID Encarnacion Slates, NP      . meloxicam (MOBIC) tablet 15 mg  15 mg Oral Daily PRN Encarnacion Slates, NP      . ondansetron (ZOFRAN-ODT) disintegrating tablet 8 mg  8 mg Oral Q8H PRN Encarnacion Slates, NP      .  pneumococcal 23 valent vaccine (PNU-IMMUNE) injection 0.5 mL  0.5 mL Intramuscular Tomorrow-1000 Encarnacion Slates, NP      . polycarbophil (FIBERCON) tablet 625 mg  625 mg Oral Daily Encarnacion Slates, NP      . tiZANidine (ZANAFLEX) tablet 2 mg  2 mg Oral Q8H PRN Hampton Abbot, MD   2 mg at 05/17/16 2002  . traZODone (DESYREL) tablet 200 mg  200 mg Oral QHS Hampton Abbot, MD   200 mg at 05/17/16 2258   PTA Medications: Prescriptions prior to admission  Medication Sig Dispense Refill Last Dose  . atazanavir-cobicistat (EVOTAZ) 300-150 MG tablet Take 1 tablet by mouth daily. Reported on 03/16/2016   05/17/2016 at Unknown time  . clobetasol ointment (TEMOVATE) 4.27 % Apply 1 application topically 2 (two) times daily.   Past Week at Unknown time  . DULoxetine (CYMBALTA) 60 MG capsule Take 60 mg by mouth daily.   05/17/2016 at Unknown time  . emtricitabine-tenofovir AF (DESCOVY) 200-25 MG tablet Take 1 tablet by mouth daily.   05/17/2016 at Unknown time  . gabapentin (NEURONTIN) 100 MG capsule Take 300 mg by mouth 3 (three) times daily.   05/17/2016 at Unknown time  . meloxicam (MOBIC) 7.5 MG tablet Take 15 mg by mouth daily as needed for pain.   05/17/2016 at Unknown time  . polycarbophil (FIBERCON) 625 MG tablet Take 625 mg by mouth daily.   05/17/2016 at Unknown time  . tiZANidine (ZANAFLEX) 2 MG tablet Take 2 mg by mouth every 8 (eight) hours as needed for muscle spasms.   05/17/2016 at Unknown time  . traZODone (DESYREL) 100 MG tablet Take 200 mg by mouth at bedtime.   05/17/2016 at Unknown  time  . naproxen (NAPROSYN) 500 MG tablet Take 1 tablet (500 mg total) by mouth 2 (two) times daily with a meal. (Patient not taking: Reported on 03/16/2016) 20 tablet 0   . ondansetron (ZOFRAN ODT) 8 MG disintegrating tablet Take 1 tablet (8 mg total) by mouth every 8 (eight) hours as needed for nausea or vomiting. (Patient not taking: Reported on 03/16/2016) 20 tablet 0    Musculoskeletal: Strength & Muscle Tone: within normal  limits Gait & Station: normal Patient leans: N/A  Psychiatric Specialty Exam: Physical Exam  Constitutional: She is oriented to person, place, and time. She appears well-developed.  HENT:  Head: Normocephalic.  Eyes: Pupils are equal, round, and reactive to light.  Neck: Normal range of motion.  Cardiovascular:  Elevated blood pressure  Respiratory: Effort normal.  GI: Soft.  Genitourinary:  Denies any issues in this area  Musculoskeletal: Normal range of motion.  Neurological: She is alert and oriented to person, place, and time.  Skin: Skin is warm and dry.  Psychiatric: Her speech is normal and behavior is normal. Thought content normal. Her mood appears anxious. Cognition and memory are normal. She expresses impulsivity. She exhibits a depressed mood.    Review of Systems  Constitutional: Positive for chills, malaise/fatigue and diaphoresis.  HENT: Negative.   Eyes: Negative.   Respiratory: Negative.   Cardiovascular:       Elevated blood pressure  Gastrointestinal: Negative.   Genitourinary: Negative.   Musculoskeletal: Positive for myalgias and joint pain.  Skin: Negative.   Neurological: Negative.   Endo/Heme/Allergies: Negative.   Psychiatric/Behavioral: Positive for depression and substance abuse (Alcoholism, chronic). Negative for suicidal ideas, hallucinations and memory loss. The patient is nervous/anxious and has insomnia.     Blood pressure 150/99, pulse 79, temperature 98.6 F (37 C), temperature source Oral, resp. rate 18, height '5\' 6"'  (1.676 m), weight 109.77 kg (242 lb), SpO2 100 %.Body mass index is 39.08 kg/(m^2).  General Appearance: Disheveled  Eye Contact:  Good  Speech:  Clear and Coherent  Volume:  Normal  Mood:  Anxious and Depressed  Affect:  Congruent  Thought Process:  Coherent and Goal Directed  Orientation:  Full (Time, Place, and Person)  Thought Content:  Rumination  Suicidal Thoughts:  Denies  Homicidal Thoughts:  Denies  Memory:   Immediate;   Good Recent;   Good Remote;   Good  Judgement:  Intact  Insight:  Present  Psychomotor Activity:  Restlessness  Concentration:  Concentration: Good and Attention Span: Good  Recall:  Good  Fund of Knowledge:  Fair  Language:  Good  Akathisia:  Negative  Handed:  Right  AIMS (if indicated):     Assets:  Communication Skills Desire for Improvement  ADL's:  Intact  Cognition:  WNL  Sleep:      Treatment Plan Summary: Daily contact with patient to assess and evaluate symptoms and progress in treatment and Medication management:  Medication management and Plan   Restart psychotropic & substance withdrawal protocols if required.  Monitor overnight; SW/TTS to work with patient rehab services, substance abuse resources, and outpatient services for medication management.  Observation Level/Precautions:  15 minute checks  Psychotherapy: Will recommend counseling services upon discharge.  Medications: Resume Cymbalta 60 mg for depression, Gabapentin for agitation/alcohol withdrawal syndrome/pain management, Trazodone 200 mg for insomnia. Resumed all other pertinent home medications for the other medical issues.    Consultations:  As appropriate  Discharge Concerns:  Safety, stabilization, and access to medication/treatment programs for  substance abuse issues.  Estimated LOS: 24 hours observation  Other:  Social worker/TTS to work with patient on discharge planning and concerns.   Encarnacion Slates, NP, PMHNP, FNP-BC 6/8/201710:46 AM

## 2016-05-18 NOTE — Progress Notes (Signed)
Patient in bed at shift change watching tv and laughing with other patients and engaging in conversation.  Pt later on phone in animated conversation and complains of not being able to cath her breath and sts she has some lower chest cramps and discomfort.   Pt advised to terminate call if it is causing her to get excited.  Pt asked to sit up on side of bed and to breath slowly and deeply.  Pt given PRN med for cramps. Pt offered emotional support. Pt continuously observed for safety on unit except when in bathroom. Pt resting quietly and sts cramps have subsided as well as difficulty breathing. Patient remains safe.

## 2016-05-18 NOTE — Progress Notes (Addendum)
[facility] Discharge instructions provided and explained. Medications reviewed. Rx given. All questions answered. Pt stable at discharge. Pt under continuous observation unless using facilities. Belongings returned.

## 2016-05-18 NOTE — B[facility] Counselor (Signed)
Spoke with pt. This a.m. Pt gave verbal consent to send referral for residential S.A. Referral faxed to [facility], [facility], and [facility]. Sergey Ishler K. Sherlon HandingHarris, LCAS-A, LPC-A, [facility]  Counselor 05/18/2016 9:44 AM

## 2016-05-29 ENCOUNTER — Ambulatory Visit: Payer: Medicaid Other

## 2016-06-04 ENCOUNTER — Emergency Department: Payer: Medicaid Other

## 2016-06-04 ENCOUNTER — Emergency Department
Admission: EM | Admit: 2016-06-04 | Discharge: 2016-06-04 | Disposition: A | Discharge status: Home / Self Care | Payer: Medicaid Other | Attending: Emergency Medicine | Admitting: Emergency Medicine

## 2016-06-04 ENCOUNTER — Encounter: Payer: Self-pay | Admitting: Emergency Medicine

## 2016-06-04 DIAGNOSIS — F17210 Nicotine dependence, cigarettes, uncomplicated: Secondary | ICD-10-CM | POA: Diagnosis not present

## 2016-06-04 DIAGNOSIS — S93402A Sprain of unspecified ligament of left ankle, initial encounter: Secondary | ICD-10-CM | POA: Diagnosis not present

## 2016-06-04 DIAGNOSIS — Z91013 Allergy to seafood: Secondary | ICD-10-CM | POA: Diagnosis not present

## 2016-06-04 DIAGNOSIS — Y929 Unspecified place or not applicable: Secondary | ICD-10-CM | POA: Insufficient documentation

## 2016-06-04 DIAGNOSIS — W502XXA Accidental twist by another person, initial encounter: Secondary | ICD-10-CM | POA: Insufficient documentation

## 2016-06-04 DIAGNOSIS — Y999 Unspecified external cause status: Secondary | ICD-10-CM | POA: Insufficient documentation

## 2016-06-04 DIAGNOSIS — F329 Major depressive disorder, single episode, unspecified: Secondary | ICD-10-CM | POA: Diagnosis not present

## 2016-06-04 DIAGNOSIS — J45909 Unspecified asthma, uncomplicated: Secondary | ICD-10-CM | POA: Insufficient documentation

## 2016-06-04 DIAGNOSIS — B20 Human immunodeficiency virus [HIV] disease: Secondary | ICD-10-CM | POA: Insufficient documentation

## 2016-06-04 DIAGNOSIS — S99912A Unspecified injury of left ankle, initial encounter: Secondary | ICD-10-CM | POA: Diagnosis present

## 2016-06-04 DIAGNOSIS — L309 Dermatitis, unspecified: Secondary | ICD-10-CM | POA: Insufficient documentation

## 2016-06-04 DIAGNOSIS — Y939 Activity, unspecified: Secondary | ICD-10-CM | POA: Insufficient documentation

## 2016-06-04 DIAGNOSIS — Z79899 Other long term (current) drug therapy: Secondary | ICD-10-CM | POA: Diagnosis not present

## 2016-06-04 MED ORDER — HYDROXYZINE PAMOATE 50 MG PO CAPS: 50.0000 mg | ORAL_CAPSULE | Freq: Three times a day (TID) | ORAL | Status: DC | PRN

## 2016-06-04 MED ORDER — DIPHENHYDRAMINE HCL 50 MG/ML IJ SOLN
50.0000 mg | Freq: Once | INTRAMUSCULAR | Status: AC
  Administered 2016-06-04: 50 mg via INTRAMUSCULAR
  Filled 2016-06-04: qty 1

## 2016-06-04 MED ORDER — TRIAMCINOLONE 0.1 % CREAM:EUCERIN CREAM 1:1: 1.0000 "application " | TOPICAL_CREAM | Freq: Three times a day (TID) | CUTANEOUS | Status: DC

## 2016-06-04 NOTE — ED Notes (Signed)
Patient given a pack of crackers and apple juice. Pt asked "can I get a ride home?" Explained to patient she will be responsible for a way home as she has been on the phone with multiple people while in the ED. Also told pt she is more than welcomed to call for a cab and pay for it herself.

## 2016-06-04 NOTE — ED Notes (Signed)
Ems pt to the lobby for complaint of rash to the mid back and mid chest, itching x1 week,   HIV +
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
## 2016-06-04 NOTE — ED Notes (Signed)
Pt told numerous times to be quiet as she is cursing loud while talking on her phone.  Patient states she wants to go home and says "get that doctor to let me go home" Explained to pt that Meghan Jarvis the PA had already told her it would 45 mins to an hour before the XR results came back.  Explained to patient if she did not want to wait for her XR results she could leave. Pt turned her back to me said I was mean and continued to yell on her phone.

## 2016-06-04 NOTE — Discharge Instructions (Signed)
Eczema Eczema, also called atopic dermatitis, is a skin disorder that causes inflammation of the skin. It causes a red rash and dry, scaly skin. The skin becomes very itchy. Eczema is generally worse during the cooler winter months and often improves with the warmth of summer. Eczema usually starts showing signs in infancy. Some children outgrow eczema, but it may last through adulthood.  CAUSES  The exact cause of eczema is not known, but it appears to run in families. People with eczema often have a family history of eczema, allergies, asthma, or hay fever. Eczema is not contagious. Flare-ups of the condition may be caused by:   Contact with something you are sensitive or allergic to.   Stress. SIGNS AND SYMPTOMS  Dry, scaly skin.   Red, itchy rash.   Itchiness. This may occur before the skin rash and may be very intense.  DIAGNOSIS  The diagnosis of eczema is usually made based on symptoms and medical history. TREATMENT  Eczema cannot be cured, but symptoms usually can be controlled with treatment and other strategies. A treatment plan might include:  Controlling the itching and scratching.   Use over-the-counter antihistamines as directed for itching. This is especially useful at night when the itching tends to be worse.   Use over-the-counter steroid creams as directed for itching.   Avoid scratching. Scratching makes the rash and itching worse. It may also result in a skin infection (impetigo) due to a break in the skin caused by scratching.   Keeping the skin well moisturized with creams every day. This will seal in moisture and help prevent dryness. Lotions that contain alcohol and water should be avoided because they can dry the skin.   Limiting exposure to things that you are sensitive or allergic to (allergens).   Recognizing situations that cause stress.   Developing a plan to manage stress.  HOME CARE INSTRUCTIONS   Only take over-the-counter or  prescription medicines as directed by your health care provider.   Do not use anything on the skin without checking with your health care provider.   Keep baths or showers short (5 minutes) in warm (not hot) water. Use mild cleansers for bathing. These should be unscented. You may add nonperfumed bath oil to the bath water. It is best to avoid soap and bubble bath.   Immediately after a bath or shower, when the skin is still damp, apply a moisturizing ointment to the entire body. This ointment should be a petroleum ointment. This will seal in moisture and help prevent dryness. The thicker the ointment, the better. These should be unscented.   Keep fingernails cut short. Children with eczema may need to wear soft gloves or mittens at night after applying an ointment.   Dress in clothes made of cotton or cotton blends. Dress lightly, because heat increases itching.   A child with eczema should stay away from anyone with fever blisters or cold sores. The virus that causes fever blisters (herpes simplex) can cause a serious skin infection in children with eczema. SEEK MEDICAL CARE IF:   Your itching interferes with sleep.   Your rash gets worse or is not better within 1 week after starting treatment.   You see pus or soft yellow scabs in the rash area.   You have a fever.   You have a rash flare-up after contact with someone who has fever blisters.    This information is not intended to replace advice given to you by your health care  provider. Make sure you discuss any questions you have with your health care provider.   Document Released: 11/24/2000 Document Revised: 09/17/2013 Document Reviewed: 06/30/2013 Elsevier Interactive Patient Education 2016 [employer] Inc.  Ankle Sprain An ankle sprain is an injury to the strong, fibrous tissues (ligaments) that hold the bones of your ankle joint together.  CAUSES An ankle sprain is usually caused by a fall or by twisting your ankle.  Ankle sprains most commonly occur when you step on the outer edge of your foot, and your ankle turns inward. People who participate in sports are more prone to these types of injuries.  SYMPTOMS   Pain in your ankle. The pain may be present at rest or only when you are trying to stand or walk.  Swelling.  Bruising. Bruising may develop immediately or within 1 to 2 days after your injury.  Difficulty standing or walking, particularly when turning corners or changing directions. DIAGNOSIS  Your caregiver will ask you details about your injury and perform a physical exam of your ankle to determine if you have an ankle sprain. During the physical exam, your caregiver will press on and apply pressure to specific areas of your foot and ankle. Your caregiver will try to move your ankle in certain ways. An X-ray exam may be done to be sure a bone was not broken or a ligament did not separate from one of the bones in your ankle (avulsion fracture).  TREATMENT  Certain types of braces can help stabilize your ankle. Your caregiver can make a recommendation for this. Your caregiver may recommend the use of medicine for pain. If your sprain is severe, your caregiver may refer you to a surgeon who helps to restore function to parts of your skeletal system (orthopedist) or a physical therapist. HOME CARE INSTRUCTIONS   Apply ice to your injury for 1-2 days or as directed by your caregiver. Applying ice helps to reduce inflammation and pain.  Put ice in a plastic bag.  Place a towel between your skin and the bag.  Leave the ice on for 15-20 minutes at a time, every 2 hours while you are awake.  Only take over-the-counter or prescription medicines for pain, discomfort, or fever as directed by your caregiver.  Elevate your injured ankle above the level of your heart as much as possible for 2-3 days.  If your caregiver recommends crutches, use them as instructed. Gradually put weight on the affected ankle.  Continue to use crutches or a cane until you can walk without feeling pain in your ankle.  If you have a plaster splint, wear the splint as directed by your caregiver. Do not rest it on anything harder than a pillow for the first 24 hours. Do not put weight on it. Do not get it wet. You may take it off to take a shower or bath.  You may have been given an elastic bandage to wear around your ankle to provide support. If the elastic bandage is too tight (you have numbness or tingling in your foot or your foot becomes cold and blue), adjust the bandage to make it comfortable.  If you have an air splint, you may blow more air into it or let air out to make it more comfortable. You may take your splint off at night and before taking a shower or bath. Wiggle your toes in the splint several times per day to decrease swelling. SEEK MEDICAL CARE IF:   You have rapidly increasing bruising or  swelling.  Your toes feel extremely cold or you lose feeling in your foot.  Your pain is not relieved with medicine. SEEK IMMEDIATE MEDICAL CARE IF:  Your toes are numb or blue.  You have severe pain that is increasing. MAKE SURE YOU:   Understand these instructions.  Will watch your condition.  Will get help right away if you are not doing well or get worse.   This information is not intended to replace advice given to you by your health care provider. Make sure you discuss any questions you have with your health care provider.   Document Released: 11/27/2005 Document Revised: 12/18/2014 Document Reviewed: 12/09/2011 Elsevier Interactive Patient Education [employer] Else[employer] Inc.

## 2016-06-05 ENCOUNTER — Telehealth: Payer: Self-pay | Admitting: Emergency Medicine

## 2016-06-05 NOTE — ED Notes (Signed)
Pt [facility]  I explained that we gave her the paper copies.  i called [facility] and their expectation is that er rxs are filled locally.  i called the pt and explained that the prescriptions are on the 4 dollar list at [facility].  She said she has medicaid and her co pay is 6 dollars.  I told her to go to [facility] and get each for 4 dollars.  She agrees.

## 2016-06-21 ENCOUNTER — Encounter: Payer: Self-pay | Admitting: Emergency Medicine

## 2016-06-21 ENCOUNTER — Emergency Department
Admission: EM | Admit: 2016-06-21 | Discharge: 2016-06-21 | Disposition: A | Discharge status: Home / Self Care | Payer: Medicaid Other | Attending: Emergency Medicine | Admitting: Emergency Medicine

## 2016-06-21 DIAGNOSIS — J45909 Unspecified asthma, uncomplicated: Secondary | ICD-10-CM | POA: Insufficient documentation

## 2016-06-21 DIAGNOSIS — Z008 Encounter for other general examination: Secondary | ICD-10-CM | POA: Diagnosis present

## 2016-06-21 DIAGNOSIS — F329 Major depressive disorder, single episode, unspecified: Secondary | ICD-10-CM | POA: Diagnosis not present

## 2016-06-21 DIAGNOSIS — F17210 Nicotine dependence, cigarettes, uncomplicated: Secondary | ICD-10-CM | POA: Insufficient documentation

## 2016-06-21 DIAGNOSIS — Z21 Asymptomatic human immunodeficiency virus [HIV] infection status: Secondary | ICD-10-CM | POA: Diagnosis not present

## 2016-06-21 DIAGNOSIS — Z791 Long term (current) use of non-steroidal anti-inflammatories (NSAID): Secondary | ICD-10-CM | POA: Diagnosis not present

## 2016-06-21 DIAGNOSIS — Z79899 Other long term (current) drug therapy: Secondary | ICD-10-CM | POA: Diagnosis not present

## 2016-06-21 DIAGNOSIS — R454 Irritability and anger: Secondary | ICD-10-CM | POA: Insufficient documentation

## 2016-06-21 LAB — URINE DRUG SCREEN, QUALITATIVE (ARMC ONLY)
Amphetamines, Ur Screen: NOT DETECTED
BARBITURATES, UR SCREEN: NOT DETECTED
BENZODIAZEPINE, UR SCRN: NOT DETECTED
CANNABINOID 50 NG, UR ~~LOC~~: NOT DETECTED
Cocaine Metabolite,Ur ~~LOC~~: NOT DETECTED
MDMA (Ecstasy)Ur Screen: NOT DETECTED
METHADONE SCREEN, URINE: NOT DETECTED
OPIATE, UR SCREEN: NOT DETECTED
PHENCYCLIDINE (PCP) UR S: NOT DETECTED
Tricyclic, Ur Screen: NOT DETECTED

## 2016-06-21 LAB — COMPREHENSIVE METABOLIC PANEL
ALBUMIN: 4.1 g/dL (ref 3.5–5.0)
ALT: 44 U/L (ref 14–54)
ANION GAP: 8 (ref 5–15)
AST: 37 U/L (ref 15–41)
Alkaline Phosphatase: 108 U/L (ref 38–126)
BILIRUBIN TOTAL: 2.4 mg/dL — AB (ref 0.3–1.2)
BUN: 12 mg/dL (ref 6–20)
CALCIUM: 9.5 mg/dL (ref 8.9–10.3)
CO2: 22 mmol/L (ref 22–32)
CREATININE: 1.01 mg/dL — AB (ref 0.44–1.00)
Chloride: 106 mmol/L (ref 101–111)
GFR calc non Af Amer: >60 mL/min (ref >60)
GLUCOSE: 129 mg/dL — AB (ref 65–99)
Potassium: 3.6 mmol/L (ref 3.5–5.1)
Sodium: 136 mmol/L (ref 135–145)
Total Protein: 8 g/dL (ref 6.5–8.1)

## 2016-06-21 LAB — CBC
HCT: 38.7 % (ref 35.0–47.0)
Hemoglobin: 12.7 g/dL (ref 12.0–16.0)
MCH: 29.3 pg (ref 26.0–34.0)
MCHC: 32.9 g/dL (ref 32.0–36.0)
MCV: 88.9 fL (ref 80.0–100.0)
Platelets: 212 10*3/uL (ref 150–440)
RBC: 4.35 MIL/uL (ref 3.80–5.20)
RDW: 16.6 % — ABNORMAL HIGH (ref 11.5–14.5)
WBC: 7.4 10*3/uL (ref 3.6–11.0)

## 2016-06-21 LAB — ETHANOL: Alcohol, Ethyl (B): 245 mg/dL — ABNORMAL HIGH (ref <5)

## 2016-06-21 LAB — SALICYLATE LEVEL: Salicylate Lvl: <4 mg/dL (ref 2.8–30.0)

## 2016-06-21 LAB — ACETAMINOPHEN LEVEL

## 2016-06-21 NOTE — ED Notes (Signed)
Pt presents to ED in [employer] custody, pt reports called 911 and reported she was SI, pt denies SI at this time, pt reports she stated SI because "I was mad." Pt alert and oriented, cooperative.

## 2016-06-21 NOTE — ED Provider Notes (Signed)
[facility] Emergency Department Provider Note    ____________________________________________  Time seen: ~0240  I have reviewed the triage vital signs and the nursing notes.   HISTORY  Chief Complaint Psychiatric Evaluation   History limited by: Not Limited   HPI Meghan Jarvis is a 40 y.o. female who presents to the emergency department today her IVC paperwork. The patient states that this evening she got in an argument with her girlfriend. She states that when she was upset she did mention wanting to die. However at the time of my examination patient now denies any suicidal ideation. She says that she was just upset at that time but now has no thoughts about wanting to herself or others. She denies any history of depression or self harm.  Her only medical complaint is for a diffuse rash. She was seen for this at the end of last month. She states that the cream that was given to her has been helping somewhat however the rash is still itchy.  Past Medical History  Diagnosis Date  . HIV (human immunodeficiency virus infection) (HCC)   . Depression   . Asthma     Patient Active Problem List   Diagnosis Date Noted  . Alcohol use disorder, severe, dependence (HCC) 05/18/2016  . Alcohol abuse 05/17/2016    Past Surgical History  Procedure Laterality Date  . Appendectomy    . Abdominal hysterectomy      Current Outpatient Rx  Name  Route  Sig  Dispense  Refill  . atazanavir-cobicistat (EVOTAZ) 300-150 MG tablet   Oral   Take 1 tablet by mouth daily. Reported on 03/16/2016: HIV infection   1 tablet      . clobetasol ointment (TEMOVATE) 0.05 %   Topical   Apply 1 application topically 2 (two) times daily. For eye irritation   30 g   0   . DULoxetine (CYMBALTA) 60 MG capsule   Oral   Take 1 capsule (60 mg total) by mouth daily. For depression   30 capsule   0   . emtricitabine-tenofovir AF (DESCOVY) 200-25 MG tablet   Oral   Take 1  tablet by mouth daily. For HIV infection   1 tablet   0   . gabapentin (NEURONTIN) 300 MG capsule   Oral   Take 1 capsule (300 mg total) by mouth 3 (three) times daily. For agitation/alcohol withdrawal syndrome   60 capsule   0   . hydrOXYzine (VISTARIL) 50 MG capsule   Oral   Take 1 capsule (50 mg total) by mouth 3 (three) times daily as needed.   30 capsule   0   . meloxicam (MOBIC) 7.5 MG tablet   Oral   Take 2 tablets (15 mg total) by mouth daily as needed for pain.         . naproxen (NAPROSYN) 500 MG tablet   Oral   Take 1 tablet (500 mg total) by mouth 2 (two) times daily with a meal. For pain   1 tablet   0   . ondansetron (ZOFRAN ODT) 8 MG disintegrating tablet   Oral   Take 1 tablet (8 mg total) by mouth every 8 (eight) hours as needed for nausea or vomiting.   1 tablet   0   . polycarbophil (FIBERCON) 625 MG tablet   Oral   Take 1 tablet (625 mg total) by mouth daily. For constipation         . tiZANidine (ZANAFLEX) 2  MG tablet   Oral   Take 1 tablet (2 mg total) by mouth every 8 (eight) hours as needed for muscle spasms.   30 tablet   0   . traZODone (DESYREL) 100 MG tablet   Oral   Take 2 tablets (200 mg total) by mouth at bedtime. For sleep   40 tablet   0   . Triamcinolone Acetonide (TRIAMCINOLONE 0.1 % CREAM : EUCERIN) CREA   Topical   Apply 1 application topically 3 (three) times daily.   1 each   0     Allergies Shellfish allergy; Buprenorphine hcl; and Morphine and related  No family history on file.  Social History Social History  Substance Use Topics  . Smoking status: Current Every Day Smoker -- 1.00 packs/day    Types: Cigarettes  . Smokeless tobacco: Never Used  . Alcohol Use: Yes    Review of Systems  Constitutional: Negative for fever. Cardiovascular: Negative for chest pain. Respiratory: Negative for shortness of breath. Gastrointestinal: Negative for abdominal pain, vomiting and diarrhea. Skin: Positive for  rash Neurological: Negative for headaches, focal weakness or numbness.  10-point ROS otherwise negative.  ____________________________________________   PHYSICAL EXAM:  VITAL SIGNS: ED Triage Vitals  Enc Vitals Group     BP 06/21/16 0122 119/78 mmHg     Pulse Rate 06/21/16 0122 107     Resp 06/21/16 0122 18     Temp 06/21/16 0122 98.2 F (36.8 C)     Temp Source 06/21/16 0122 Oral     SpO2 06/21/16 0122 97 %     Weight 06/21/16 0122 240 lb (108.863 kg)     Height 06/21/16 0122  (1.676 m)     Head Cir --      Peak Flow --      Pain Score 06/21/16 0124 0   Constitutional: Alert and oriented. Well appearing and in no distress. Eyes: Conjunctivae are normal. PERRL. Normal extraocular movements. ENT   Head: Normocephalic and atraumatic.   Nose: No congestion/rhinnorhea.   Mouth/Throat: Mucous membranes are moist.   Neck: No stridor. Hematological/Lymphatic/Immunilogical: No cervical lymphadenopathy. Cardiovascular: Normal rate, regular rhythm.  No murmurs, rubs, or gallops. Respiratory: Normal respiratory effort without tachypnea nor retractions. Breath sounds are clear and equal bilaterally. No wheezes/rales/rhonchi. Gastrointestinal: Soft and nontender. No distention. There is no CVA tenderness. Genitourinary: Deferred Musculoskeletal: Normal range of motion in all extremities. No joint effusions.  No lower extremity tenderness nor edema. Neurologic:  Normal speech and language. No gross focal neurologic deficits are appreciated.  Skin:  Skin is warm, dry and intact. Diffuse rash over much of her torso, somewhat eczematous in appearance. Psychiatric: Mood and affect are normal. Speech and behavior are normal. Patient exhibits appropriate insight and judgment.  ____________________________________________    LABS (pertinent positives/negatives)  Labs Reviewed  COMPREHENSIVE METABOLIC PANEL - Abnormal; Notable for the following:    Glucose, Bld 129 (*)     Creatinine, Ser 1.01 (*)    Total Bilirubin 2.4 (*)    All other components within normal limits  ETHANOL - Abnormal; Notable for the following:    Alcohol, Ethyl (B) 245 (*)    All other components within normal limits  ACETAMINOPHEN LEVEL - Abnormal; Notable for the following:    Acetaminophen (Tylenol), Serum <10 (*)    All other components within normal limits  CBC - Abnormal; Notable for the following:    RDW 16.6 (*)    All other components within normal limits  SALICYLATE LEVEL  URINE DRUG SCREEN, QUALITATIVE ([facility] ONLY)     ____________________________________________   EKG  None  ____________________________________________    RADIOLOGY  None  ____________________________________________   PROCEDURES  Procedure(s) performed: None  Critical Care performed: No  ____________________________________________   INITIAL IMPRESSION / ASSESSMENT AND PLAN / ED COURSE  Pertinent labs & imaging results that were available during my care of the patient were reviewed by me and considered in my medical decision making (see chart for details).  She presented to the emergency department today under IVC after getting an argument with her girlfriend and making statements with suicidal ideation. On exam patient was quite calm for me. She stated that she no longer had suicidal ideation but did admit to a SANE as much during the argument. Specialist on-call evaluated the patient and did not feel that she was in any acute injury to her self. They did rescind the IVC paperwork. Will discharge home with [facility] follow-up.  ____________________________________________   FINAL CLINICAL IMPRESSION(S) / ED DIAGNOSES  Final diagnoses:  Anger     Note: This dictation was prepared with Dragon dictation. Any transcriptional errors that result from this process are unintentional    Phineas SemenGraydon Cesar Rogerson, MD 06/21/16 [phone]

## 2016-06-21 NOTE — Discharge Instructions (Signed)
Please seek medical attention and help for any thoughts about wanting to harm herself, harm others, any concerning change in behavior, severe depression, inappropriate drug use or any other new or concerning symptoms.  Anger Management Anger is a normal human emotion. However, anger can range from mild irritation to rage. When your anger becomes harmful to yourself or others, it is unhealthy anger.  CAUSES  There are many reasons for unhealthy anger. Many people learn how to express anger from observing how their family expressed anger. In troubled, chaotic, or abusive families, anger can be expressed as rage or even violence. Children can grow up never learning how healthy anger can be expressed. Factors that contribute to unhealthy anger include:   Drug or alcohol abuse.  Post-traumatic stress disorder.  Traumatic brain injury. COMPLICATIONS  People with unhealthy anger tend to overreact and retaliate against a real or imagined threat. The need to retaliate can turn into violence or verbal abuse against another person. Chronic anger can lead to health problems, such as hypertension, high blood pressure, and depression. TREATMENT  Exercising, relaxing, meditating, or writing out your feelings all can be beneficial in managing moderate anger. For unhealthy anger, the following methods may be used:  Cognitive-behavioral counseling (learning skills to change the thoughts that influence your mood).  Relaxation training.  Interpersonal counseling.  Assertive communication skills.  Medication.   This information is not intended to replace advice given to you by your health care provider. Make sure you discuss any questions you have with your health care provider.   Document Released: 09/24/2007 Document Revised: 02/19/2012 Document Reviewed: 02/02/2011 Elsevier Interactive Patient Education [employer]

## 2016-09-04 ENCOUNTER — Encounter: Payer: Self-pay | Admitting: Emergency Medicine

## 2016-09-04 ENCOUNTER — Emergency Department
Admission: EM | Admit: 2016-09-04 | Discharge: 2016-09-04 | Disposition: A | Discharge status: Home / Self Care | Payer: Medicaid Other | Attending: Student | Admitting: Student

## 2016-09-04 ENCOUNTER — Emergency Department: Payer: Medicaid Other

## 2016-09-04 DIAGNOSIS — J45909 Unspecified asthma, uncomplicated: Secondary | ICD-10-CM | POA: Diagnosis not present

## 2016-09-04 DIAGNOSIS — Y929 Unspecified place or not applicable: Secondary | ICD-10-CM | POA: Diagnosis not present

## 2016-09-04 DIAGNOSIS — N390 Urinary tract infection, site not specified: Secondary | ICD-10-CM | POA: Diagnosis not present

## 2016-09-04 DIAGNOSIS — X58XXXA Exposure to other specified factors, initial encounter: Secondary | ICD-10-CM | POA: Insufficient documentation

## 2016-09-04 DIAGNOSIS — Y939 Activity, unspecified: Secondary | ICD-10-CM | POA: Diagnosis not present

## 2016-09-04 DIAGNOSIS — Y999 Unspecified external cause status: Secondary | ICD-10-CM | POA: Insufficient documentation

## 2016-09-04 DIAGNOSIS — S3992XA Unspecified injury of lower back, initial encounter: Secondary | ICD-10-CM | POA: Diagnosis present

## 2016-09-04 DIAGNOSIS — S39012A Strain of muscle, fascia and tendon of lower back, initial encounter: Secondary | ICD-10-CM

## 2016-09-04 DIAGNOSIS — Z21 Asymptomatic human immunodeficiency virus [HIV] infection status: Secondary | ICD-10-CM | POA: Diagnosis not present

## 2016-09-04 DIAGNOSIS — R059 Cough, unspecified: Secondary | ICD-10-CM

## 2016-09-04 DIAGNOSIS — F17210 Nicotine dependence, cigarettes, uncomplicated: Secondary | ICD-10-CM | POA: Diagnosis not present

## 2016-09-04 DIAGNOSIS — R05 Cough: Secondary | ICD-10-CM | POA: Insufficient documentation

## 2016-09-04 DIAGNOSIS — Z791 Long term (current) use of non-steroidal anti-inflammatories (NSAID): Secondary | ICD-10-CM | POA: Diagnosis not present

## 2016-09-04 LAB — URINALYSIS COMPLETE WITH MICROSCOPIC (ARMC ONLY)
BACTERIA UA: NONE SEEN
Bilirubin Urine: NEGATIVE
Glucose, UA: 50 mg/dL — AB
HGB URINE DIPSTICK: NEGATIVE
Ketones, ur: NEGATIVE mg/dL
NITRITE: NEGATIVE
Protein, ur: NEGATIVE mg/dL
SPECIFIC GRAVITY, URINE: 1.025 (ref 1.005–1.030)
pH: 5 (ref 5.0–8.0)

## 2016-09-04 LAB — CBC WITH DIFFERENTIAL/PLATELET
BASOS PCT: 2 %
Basophils Absolute: 0.1 10*3/uL (ref 0–0.1)
Eosinophils Absolute: 0.2 10*3/uL (ref 0–0.7)
Eosinophils Relative: 2 %
HEMATOCRIT: 42.9 % (ref 35.0–47.0)
Hemoglobin: 14.3 g/dL (ref 12.0–16.0)
Lymphocytes Relative: 33 %
Lymphs Abs: 2.5 10*3/uL (ref 1.0–3.6)
MCH: 28.3 pg (ref 26.0–34.0)
MCHC: 33.2 g/dL (ref 32.0–36.0)
MCV: 85.1 fL (ref 80.0–100.0)
MONO ABS: 0.4 10*3/uL (ref 0.2–0.9)
MONOS PCT: 5 %
NEUTROS ABS: 4.4 10*3/uL (ref 1.4–6.5)
Neutrophils Relative %: 58 %
Platelets: 341 10*3/uL (ref 150–440)
RBC: 5.04 MIL/uL (ref 3.80–5.20)
RDW: 15.4 % — AB (ref 11.5–14.5)
WBC: 7.6 10*3/uL (ref 3.6–11.0)

## 2016-09-04 LAB — COMPREHENSIVE METABOLIC PANEL
ALT: 33 U/L (ref 14–54)
ANION GAP: 7 (ref 5–15)
AST: 34 U/L (ref 15–41)
Albumin: 3.5 g/dL (ref 3.5–5.0)
Alkaline Phosphatase: 93 U/L (ref 38–126)
BILIRUBIN TOTAL: 0.9 mg/dL (ref 0.3–1.2)
BUN: 11 mg/dL (ref 6–20)
CO2: 25 mmol/L (ref 22–32)
Calcium: 9.5 mg/dL (ref 8.9–10.3)
Chloride: 105 mmol/L (ref 101–111)
Creatinine, Ser: 0.71 mg/dL (ref 0.44–1.00)
Glucose, Bld: 96 mg/dL (ref 65–99)
POTASSIUM: 4.1 mmol/L (ref 3.5–5.1)
Sodium: 137 mmol/L (ref 135–145)
TOTAL PROTEIN: 8.6 g/dL — AB (ref 6.5–8.1)

## 2016-09-04 MED ORDER — FOSFOMYCIN TROMETHAMINE 3 G PO PACK
3.0000 g | PACK | Freq: Once | ORAL | Status: AC
Start: 2016-09-04 — End: 2016-09-04
  Administered 2016-09-04: 3 g via ORAL
  Filled 2016-09-04: qty 3

## 2016-09-04 MED ORDER — NAPROXEN 500 MG PO TABS: 500.0000 mg | ORAL_TABLET | Freq: Two times a day (BID) | ORAL | 0 refills | Status: AC

## 2016-09-04 MED ORDER — OXYCODONE HCL 5 MG PO TABS
10.0000 mg | ORAL_TABLET | Freq: Once | ORAL | Status: AC
  Administered 2016-09-04: 10 mg via ORAL
  Filled 2016-09-04: qty 2

## 2016-09-04 MED ORDER — FLUCONAZOLE 50 MG PO TABS
150.0000 mg | ORAL_TABLET | Freq: Once | ORAL | Status: AC
  Administered 2016-09-04: 150 mg via ORAL
  Filled 2016-09-04: qty 1

## 2016-09-04 NOTE — ED Provider Notes (Signed)
[facility] Emergency Department Provider Note   ____________________________________________   First MD Initiated Contact with Patient 09/04/16 1108     (approximate)  I have reviewed the triage vital signs and the nursing notes.   HISTORY  Chief Complaint Back Pain and Cough    HPI Meghan Jarvis is a 40 y.o. female with HIV on evotaz and descovy CD4 730 in February 2017, undetectable HIV RNA, reports compliance has not seen her infectious disease doctor since February 2017, alcoholism who presents for evaluation of several months of lumbar back pain worse in the right, gradual onset, constant, worse with movement, moderate. Patient denies any trauma. She denies any fevers or she denies any bowel or bladder incontinence, no numbness or weakness in the legs. Had persistent cough for "weeks". He is coughing up "green stuff". She denies any chest pain or difficulty breathing. No abdominal pain, vomiting or diarrhea.   Past Medical History:  Diagnosis Date  . Asthma   . Depression   . HIV (human immunodeficiency virus infection) [facility])     Patient Active Problem List   Diagnosis Date Noted  . Alcohol use disorder, severe, dependence (HCC) 05/18/2016  . Alcohol abuse 05/17/2016    Past Surgical History:  Procedure Laterality Date  . ABDOMINAL HYSTERECTOMY    . APPENDECTOMY      Prior to Admission medications   Medication Sig Start Date End Date Taking? Authorizing Provider  atazanavir-cobicistat (EVOTAZ) 300-150 MG tablet Take 1 tablet by mouth daily. Reported on 03/16/2016: HIV infection 05/18/16   Sanjuana Kava, NP  clobetasol ointment (TEMOVATE) 0.05 % Apply 1 application topically 2 (two) times daily. For eye irritation 05/18/16   Sanjuana Kava, NP  DULoxetine (CYMBALTA) 60 MG capsule Take 1 capsule (60 mg total) by mouth daily. For depression 05/18/16   Sanjuana Kava, NP  emtricitabine-tenofovir AF (DESCOVY) 200-25 MG tablet Take 1 tablet by mouth  daily. For HIV infection 05/18/16   Sanjuana Kava, NP  gabapentin (NEURONTIN) 300 MG capsule Take 1 capsule (300 mg total) by mouth 3 (three) times daily. For agitation/alcohol withdrawal syndrome 05/18/16   Sanjuana Kava, NP  hydrOXYzine (VISTARIL) 50 MG capsule Take 1 capsule (50 mg total) by mouth 3 (three) times daily as needed. 06/04/16   Delorise Royals Cuthriell, PA-C  meloxicam (MOBIC) 7.5 MG tablet Take 2 tablets (15 mg total) by mouth daily as needed for pain. 05/18/16   Sanjuana Kava, NP  naproxen (NAPROSYN) 500 MG tablet Take 1 tablet (500 mg total) by mouth 2 (two) times daily with a meal. For pain 05/18/16   Sanjuana Kava, NP  ondansetron (ZOFRAN ODT) 8 MG disintegrating tablet Take 1 tablet (8 mg total) by mouth every 8 (eight) hours as needed for nausea or vomiting. 05/18/16   Sanjuana Kava, NP  polycarbophil (FIBERCON) 625 MG tablet Take 1 tablet (625 mg total) by mouth daily. For constipation 05/18/16   Sanjuana Kava, NP  tiZANidine (ZANAFLEX) 2 MG tablet Take 1 tablet (2 mg total) by mouth every 8 (eight) hours as needed for muscle spasms. 05/18/16   Sanjuana Kava, NP  traZODone (DESYREL) 100 MG tablet Take 2 tablets (200 mg total) by mouth at bedtime. For sleep 05/18/16   Sanjuana Kava, NP  Triamcinolone Acetonide (TRIAMCINOLONE 0.1 % CREAM : EUCERIN) CREA Apply 1 application topically 3 (three) times daily. 06/04/16   Delorise Royals Cuthriell, PA-C    Allergies Shellfish allergy; Buprenorphine  hcl; and Morphine and related  No family history on file.  Social History Social History  Substance Use Topics  . Smoking status: Current Every Day Smoker    Packs/day: 1.00    Types: Cigarettes  . Smokeless tobacco: Never Used  . Alcohol use Yes    Review of Systems Constitutional: No fever/chills Eyes: No visual changes. ENT: No sore throat. Cardiovascular: Denies chest pain. Respiratory: Denies shortness of breath. Gastrointestinal: No abdominal pain.  No nausea, no vomiting.  No diarrhea.  No  constipation. Genitourinary: Negative for dysuria. Musculoskeletal: Positive for back pain. Skin: Negative for rash. Neurological: Negative for headaches, focal weakness or numbness.  10-point ROS otherwise negative.  ____________________________________________   PHYSICAL EXAM:  VITAL SIGNS: ED Triage Vitals [09/04/16 0958]  Enc Vitals Group     BP 112/68     Pulse Rate 84     Resp 18     Temp 98.4 F (36.9 C)     Temp Source Oral     SpO2 97 %     Weight 214 lb (97.1 kg)     Height 5\' 6"  (1.676 m)     Head Circumference      Peak Flow      Pain Score 10     Pain Loc      Pain Edu?      Excl. in GC?     Constitutional: Alert and oriented. Well appearing and in no acute distress. Eyes: Conjunctivae are normal. PERRL. EOMI. Head: Atraumatic. Nose: No congestion/rhinnorhea. Mouth/Throat: Mucous membranes are moist.  Oropharynx non-erythematous. Neck: No stridor. Supple without meningismus. No midline C-spine tenderness to palpation. Cardiovascular: Normal rate, regular rhythm. Grossly normal heart sounds.  Good peripheral circulation. Respiratory: Normal respiratory effort.  No retractions. Lungs CTAB. Gastrointestinal: Soft and nontender. No distention.  No CVA tenderness. Genitourinary: deferred Musculoskeletal: No lower extremity tenderness nor edema.  No joint effusions. Midline tenderness to palpation throughout the lower T spine as well as the lumbar vertebrae, tenderness to palpation in the right and left paravertebral muscles associate with the lumbar spine bilaterally, rotation of the torso reproduces pain/worsens the pain in her back. Neurologic:  Normal speech and language. No gross focal neurologic deficits are appreciated. No gait instability. 5 out of 5 strength in bilateral upper and lower extremities, sensation intact to light touch in all extremities. 5 out of 5 strength of dorsiflexion of the big toes bilaterally.  Skin:  Skin is warm, dry and intact.  Yeast dermatitis in the groin bilaterally. Psychiatric: Mood and affect are normal. Speech and behavior are normal.  ____________________________________________   LABS (all labs ordered are listed, but only abnormal results are displayed)  Labs Reviewed  URINALYSIS COMPLETEWITH MICROSCOPIC ([facility] ONLY) - Abnormal; Notable for the following:       Result Value   Color, Urine YELLOW (*)    APPearance CLEAR (*)    Glucose, UA 50 (*)    Leukocytes, UA TRACE (*)    Squamous Epithelial / LPF 0-5 (*)    All other components within normal limits  CBC WITH DIFFERENTIAL/PLATELET - Abnormal; Notable for the following:    RDW 15.4 (*)    All other components within normal limits  COMPREHENSIVE METABOLIC PANEL - Abnormal; Notable for the following:    Total Protein 8.6 (*)    All other components within normal limits  URINE CULTURE   ____________________________________________  EKG  none ____________________________________________  RADIOLOGY  CXR IMPRESSION:  No active cardiopulmonary disease.  Xray Thoracic spine IMPRESSION:  Degenerate changes thoracic spine. No acute abnormality.      Xray Lumbar IMPRESSION:  No acute findings or significant spondylosis. Transitional  lumbosacral anatomy.    ____________________________________________   PROCEDURES  Procedure(s) performed: None  Procedures  Critical Care performed: No  ____________________________________________   INITIAL IMPRESSION / ASSESSMENT AND PLAN / ED COURSE  Pertinent labs & imaging results that were available during my care of the patient were reviewed by me and considered in my medical decision making (see chart for details).  JISELLA ASHENFELTER is a 40 y.o. female with HIV on evotaz and descovy CD4 730 in February 2017, undetectable HIV RNA, reports compliance has not seen her infectious disease doctor since February 2017, alcoholism who presents for evaluation of several months of  lumbar back pain worse in the right. On exam, she is generally well-appearing and in no acute distress. Her vital signs are stable, she is afebrile. She appears to have reproducible tenderness throughout the midline and the paravertebral muscles of the lower thoracic/diffusely throughout the lumbar spine and I suspect her pain is muscular skeletal in nature. No red flags concerning for cauda equina or epidural abscess. However, given her persistent pain for several months, we'll obtain plain films of the thoracic and lumbar spine. Given her persistent cough and HIV status will obtain screening labs and chest x-ray. We'll treat her symptomatically reassess for disposition. Urinalysis also pending.  ----------------------------------------- 1:55 PM on 09/04/2016 -----------------------------------------  Patient reports her pain has improved at this time. She is resting comfortably, intermittently sitting up in bed eating peanut butter crackers. Her plain films are negative for any acute cardiopulmonary abnormality or any acute fracture or dislocation. Postvoid residual volume 0 mls which is reassuring. CBC and CMP are generally unremarkable. Urinalysis with 6-30 white blood cells, trace leukocytes, we'll treat with fosfomycin in the ER for possible urinary tract infection and send culture. She also has what appears to be a yeast dermatitis in the inguinal region, I've ordered 1 dose of by mouth Diflucan here. We discussed the  return precautions for close PCP follow-up and she is comfortable with the discharge plan. DC home.  Clinical Course     ____________________________________________   FINAL CLINICAL IMPRESSION(S) / ED DIAGNOSES  Final diagnoses:  Cough  Lumbar strain, initial encounter  UTI (lower urinary tract infection)      NEW MEDICATIONS STARTED DURING THIS VISIT:  New Prescriptions   No medications on file     Note:  This document was prepared using Dragon voice  recognition software and may include unintentional dictation errors.    Gayla Doss, MD 09/04/16 1400

## 2016-09-04 NOTE — ED Triage Notes (Signed)
Reports lower back pain and cough

## 2016-09-05 LAB — URINE CULTURE: Culture: NO GROWTH

## 2016-09-11 ENCOUNTER — Other Ambulatory Visit: Payer: Self-pay | Admitting: Physician Assistant

## 2016-09-11 DIAGNOSIS — Z1239 Encounter for other screening for malignant neoplasm of breast: Secondary | ICD-10-CM

## 2016-09-22 ENCOUNTER — Encounter: Payer: Self-pay | Admitting: Medical Oncology

## 2016-09-22 ENCOUNTER — Emergency Department
Admission: EM | Admit: 2016-09-22 | Discharge: 2016-09-22 | Disposition: A | Discharge status: Home / Self Care | Payer: Medicaid Other | Attending: Emergency Medicine | Admitting: Emergency Medicine

## 2016-09-22 ENCOUNTER — Emergency Department: Payer: Medicaid Other

## 2016-09-22 DIAGNOSIS — Z21 Asymptomatic human immunodeficiency virus [HIV] infection status: Secondary | ICD-10-CM | POA: Insufficient documentation

## 2016-09-22 DIAGNOSIS — J45909 Unspecified asthma, uncomplicated: Secondary | ICD-10-CM | POA: Diagnosis not present

## 2016-09-22 DIAGNOSIS — M545 Low back pain, unspecified: Secondary | ICD-10-CM

## 2016-09-22 DIAGNOSIS — F17210 Nicotine dependence, cigarettes, uncomplicated: Secondary | ICD-10-CM | POA: Insufficient documentation

## 2016-09-22 DIAGNOSIS — G8929 Other chronic pain: Secondary | ICD-10-CM | POA: Insufficient documentation

## 2016-09-22 DIAGNOSIS — Z79899 Other long term (current) drug therapy: Secondary | ICD-10-CM | POA: Insufficient documentation

## 2016-09-22 LAB — URINALYSIS COMPLETE WITH MICROSCOPIC (ARMC ONLY)
BILIRUBIN URINE: NEGATIVE
Bacteria, UA: NONE SEEN
GLUCOSE, UA: NEGATIVE mg/dL
HGB URINE DIPSTICK: NEGATIVE
Ketones, ur: NEGATIVE mg/dL
LEUKOCYTES UA: NEGATIVE
NITRITE: NEGATIVE
Protein, ur: 30 mg/dL — AB
SPECIFIC GRAVITY, URINE: 1.023 (ref 1.005–1.030)
pH: 6 (ref 5.0–8.0)

## 2016-09-22 MED ORDER — METHOCARBAMOL 500 MG PO TABS: 1000.0000 mg | ORAL_TABLET | Freq: Four times a day (QID) | ORAL | 0 refills | Status: DC

## 2016-09-22 MED ORDER — DIAZEPAM 2 MG PO TABS
ORAL_TABLET | ORAL | Status: AC
  Administered 2016-09-22: 2 mg via ORAL
  Filled 2016-09-22: qty 1

## 2016-09-22 MED ORDER — DIAZEPAM 2 MG PO TABS
2.0000 mg | ORAL_TABLET | Freq: Once | ORAL | Status: AC
Start: 2016-09-22 — End: 2016-09-22
  Administered 2016-09-22: 2 mg via ORAL

## 2016-09-22 NOTE — ED Provider Notes (Signed)
[facility] Emergency Department Provider Note  ____________________________________________   First MD Initiated Contact with Patient 09/22/16 1224     (approximate)  I have reviewed the triage vital signs and the nursing notes.   HISTORY  Chief Complaint Back Pain    HPI Meghan Jarvis is a 40 y.o. female is here with complaint of back pain.patient states that she was seen in the emergency room 2 weeks ago for same complaint. She has not had any recent injury and there is been no changes to her pain since she was seen at that time. Patient states that she continues to take both pain medication and muscle relaxant without any relief. Patient reports that pain is worse with movement. She denies any dysuria or history of kidney stones. She states she still continues to have a cough. Patient smokes 1 pack cigarettes per day.patient is HIV positive [place]really she rates her pain as 10 over 10.   Past Medical History:  Diagnosis Date  . Asthma   . Depression   . HIV (human immunodeficiency virus infection) [facility](HCC)     Patient Active Problem List   Diagnosis Date Noted  . Alcohol use disorder, severe, dependence (HCC) 05/18/2016  . Alcohol abuse 05/17/2016    Past Surgical History:  Procedure Laterality Date  . ABDOMINAL HYSTERECTOMY    . APPENDECTOMY      Prior to Admission medications   Medication Sig Start Date End Date Taking? Authorizing Provider  atazanavir-cobicistat (EVOTAZ) 300-150 MG tablet Take 1 tablet by mouth daily. Reported on 03/16/2016: HIV infection 05/18/16   Sanjuana KavaAgnes I Nwoko, NP  clobetasol ointment (TEMOVATE) 0.05 % Apply 1 application topically 2 (two) times daily. For eye irritation 05/18/16   Sanjuana KavaAgnes I Nwoko, NP  DULoxetine (CYMBALTA) 60 MG capsule Take 1 capsule (60 mg total) by mouth daily. For depression 05/18/16   Sanjuana KavaAgnes I Nwoko, NP  emtricitabine-tenofovir AF (DESCOVY) 200-25 MG tablet Take 1  tablet by mouth daily. For HIV infection 05/18/16   Sanjuana KavaAgnes I Nwoko, NP  gabapentin (NEURONTIN) 300 MG capsule Take 1 capsule (300 mg total) by mouth 3 (three) times daily. For agitation/alcohol withdrawal syndrome 05/18/16   Sanjuana KavaAgnes I Nwoko, NP  hydrOXYzine (VISTARIL) 50 MG capsule Take 1 capsule (50 mg total) by mouth 3 (three) times daily as needed. 06/04/16   Delorise RoyalsJonathan D Cuthriell, PA-C  meloxicam (MOBIC) 7.5 MG tablet Take 2 tablets (15 mg total) by mouth daily as needed for pain. 05/18/16   Sanjuana KavaAgnes I Nwoko, NP  methocarbamol (ROBAXIN) 500 MG tablet Take 2 tablets (1,000 mg total) by mouth 4 (four) times daily. 09/22/16   Tommi Rumpshonda L Mallie Giambra, PA-C  ondansetron (ZOFRAN ODT) 8 MG disintegrating tablet Take 1 tablet (8 mg total) by mouth every 8 (eight) hours as needed for nausea or vomiting. 05/18/16   Sanjuana KavaAgnes I Nwoko, NP  polycarbophil (FIBERCON) 625 MG tablet Take 1 tablet (625 mg total) by mouth daily. For constipation 05/18/16   Sanjuana KavaAgnes I Nwoko, NP  tiZANidine (ZANAFLEX) 2 MG tablet Take 1 tablet (2 mg total) by mouth every 8 (eight) hours as needed for muscle spasms. 05/18/16   Sanjuana KavaAgnes I Nwoko, NP  traZODone (DESYREL) 100 MG tablet Take 2 tablets (200 mg total) by mouth at bedtime. For sleep 05/18/16   Sanjuana KavaAgnes I Nwoko, NP  Triamcinolone Acetonide (TRIAMCINOLONE 0.1 % CREAM : EUCERIN) CREA Apply 1 application topically 3 (three) times daily. 06/04/16   Delorise RoyalsJonathan D  Cuthriell, PA-C    Allergies Shellfish allergy; Buprenorphine hcl; and Morphine and related  No family history on file.  Social History Social History  Substance Use Topics  . Smoking status: Current Every Day Smoker    Packs/day: 1.00    Types: Cigarettes  . Smokeless tobacco: Never Used  . Alcohol use Yes    Review of Systems Constitutional: No fever/chills ENT: No sore throat. Cardiovascular: Denies chest pain. Respiratory: Denies shortness of breath.positive for nonproductive cough.. Gastrointestinal: No abdominal pain.  No nausea, no vomiting.  No  diarrhea.  No constipation. Genitourinary: Negative for dysuria. Musculoskeletal: positive for back pain. Skin: Negative for rash. Neurological: Negative for headaches, focal weakness or numbness.  10-point ROS otherwise negative.  ____________________________________________   PHYSICAL EXAM:  VITAL SIGNS: ED Triage Vitals  Enc Vitals Group     BP 09/22/16 1208 100/76     Pulse Rate 09/22/16 1208 72     Resp 09/22/16 1208 18     Temp 09/22/16 1208 98 F (36.7 C)     Temp Source 09/22/16 1208 Oral     SpO2 09/22/16 1208 99 %     Weight 09/22/16 1208 214 lb (97.1 kg)     Height 09/22/16 1208 5\' 8"  (1.727 m)     Head Circumference --      Peak Flow --      Pain Score 09/22/16 1209 10     Pain Loc --      Pain Edu? --      Excl. in GC? --     Constitutional: Alert and oriented. Well appearing and in no acute distress. Eyes: Conjunctivae are normal. PERRL. EOMI. Head: Atraumatic. Nose: No congestion/rhinnorhea. Neck: No stridor.   Cardiovascular: Normal rate, regular rhythm. Grossly normal heart sounds.  Good peripheral circulation. Respiratory: Normal respiratory effort.  No retractions. Lungs CTAB. Occasional nonproductive cough heard. Gastrointestinal: Soft and nontender. No distention.  No CVA tenderness. Musculoskeletal: eye examination of back there is no gross deformity noted. There is soft tissue  Is tenderness of the lumbar spine and paravertebral muscle area but no tenderness on palpation of the spinous processes.  Range of motion is slow and guarded but no active muscle spasms were seen.  Neurologic:  Normal speech and language. No gross focal neurologic deficits are appreciated. No gait instability. Skin:  Skin is warm, dry and intact. No rash noted. Psychiatric: Mood and affect are normal. Speech and behavior are normal.  ____________________________________________   LABS (all labs ordered are listed, but only abnormal results are displayed)  Labs Reviewed    URINALYSIS COMPLETEWITH MICROSCOPIC ([facility] ONLY) - Abnormal; Notable for the following:       Result Value   Color, Urine YELLOW (*)    APPearance CLEAR (*)    Protein, ur 30 (*)    Squamous Epithelial / LPF 0-5 (*)    All other components within normal limits    RADIOLOGY  Chest x-ray was negative for cardiopulmonary disease. ____________________________________________   PROCEDURES  Procedure(s) performed: none  Procedures  Critical Care performed: no  ____________________________________________   INITIAL IMPRESSION / ASSESSMENT AND PLAN / ED COURSE  Pertinent labs & imaging results that were available during my care of the patient were reviewed by me and considered in my medical decision making (see chart for details).    Clinical Course  patient states that she has her medications with her and her purse however she was unable to locate them and prescriptions that she has  with her today were not written at [facility]. Patient states that she goes from pharmacy to pharmacy and she is uncertain which place she went to for this particular prescription.no pain medication was noted in the [facility] website for [facility].patient is given prescription for Robaxin 500 mg 2 tablets 4 times a day as needed for muscle spasms. Patient is follow-up with her primary care doctor at [facility] for any continued back pain. She is also encouraged to use ice or heat to her back.   ____________________________________________   FINAL CLINICAL IMPRESSION(S) / ED DIAGNOSES  Final diagnoses:  Chronic left-sided low back pain without sciatica      NEW MEDICATIONS STARTED DURING THIS VISIT:  Discharge Medication List as of 09/22/2016  3:17 PM    START taking these medications   Details  methocarbamol (ROBAXIN) 500 MG tablet Take 2 tablets (1,000 mg total) by mouth 4 (four) times daily., Starting Fri 09/22/2016, Print         Note:  This document was prepared using Dragon voice  recognition software and may include unintentional dictation errors.    Tommi Rumps, PA-C 09/22/16 1643    Jene Every, MD 09/23/16 [phone]

## 2016-09-22 NOTE — ED Triage Notes (Signed)
Pt reports she was seen here 2 weeks ago for same back pain, to mid back. Pt reports pain worsens with movement. Denies dysuria.

## 2016-09-22 NOTE — Discharge Instructions (Signed)
Call Monday for an appointment for your continued back pain. Schedule appointment. Continue regular medication. Robaxin 500 mg 2 tablets 4 times a day. Moist heat or ice to your back frequently for back pain. Your urine does not show any signs of infection and your chest x-ray was clear today.

## 2016-10-03 ENCOUNTER — Ambulatory Visit
Admission: RE | Admit: 2016-10-03 | Discharge: 2016-10-03 | Disposition: A | Discharge status: Home / Self Care | Payer: Medicaid Other | Source: Ambulatory Visit | Attending: Physician Assistant | Admitting: Physician Assistant

## 2016-10-03 DIAGNOSIS — Z1231 Encounter for screening mammogram for malignant neoplasm of breast: Secondary | ICD-10-CM | POA: Diagnosis not present

## 2016-10-03 DIAGNOSIS — Z1239 Encounter for other screening for malignant neoplasm of breast: Secondary | ICD-10-CM

## 2016-12-13 ENCOUNTER — Encounter: Payer: Self-pay | Admitting: *Deleted

## 2016-12-13 ENCOUNTER — Emergency Department: Payer: Medicaid Other

## 2016-12-13 ENCOUNTER — Emergency Department
Admission: EM | Admit: 2016-12-13 | Discharge: 2016-12-13 | Disposition: A | Discharge status: Home / Self Care | Payer: Medicaid Other | Attending: Emergency Medicine | Admitting: Emergency Medicine

## 2016-12-13 DIAGNOSIS — Z791 Long term (current) use of non-steroidal anti-inflammatories (NSAID): Secondary | ICD-10-CM | POA: Diagnosis not present

## 2016-12-13 DIAGNOSIS — J181 Lobar pneumonia, unspecified organism: Secondary | ICD-10-CM | POA: Diagnosis not present

## 2016-12-13 DIAGNOSIS — Z21 Asymptomatic human immunodeficiency virus [HIV] infection status: Secondary | ICD-10-CM | POA: Insufficient documentation

## 2016-12-13 DIAGNOSIS — J45909 Unspecified asthma, uncomplicated: Secondary | ICD-10-CM | POA: Diagnosis not present

## 2016-12-13 DIAGNOSIS — F17210 Nicotine dependence, cigarettes, uncomplicated: Secondary | ICD-10-CM | POA: Insufficient documentation

## 2016-12-13 DIAGNOSIS — Z79899 Other long term (current) drug therapy: Secondary | ICD-10-CM | POA: Insufficient documentation

## 2016-12-13 DIAGNOSIS — J189 Pneumonia, unspecified organism: Secondary | ICD-10-CM

## 2016-12-13 DIAGNOSIS — R05 Cough: Secondary | ICD-10-CM | POA: Diagnosis present

## 2016-12-13 LAB — CBC WITH DIFFERENTIAL/PLATELET
BASOS PCT: 1 %
Basophils Absolute: 0.1 10*3/uL (ref 0–0.1)
Eosinophils Absolute: 0 10*3/uL (ref 0–0.7)
Eosinophils Relative: 0 %
HEMATOCRIT: 47.4 % — AB (ref 35.0–47.0)
HEMOGLOBIN: 15.8 g/dL (ref 12.0–16.0)
LYMPHS ABS: 1.6 10*3/uL (ref 1.0–3.6)
Lymphocytes Relative: 33 %
MCH: 26.9 pg (ref 26.0–34.0)
MCHC: 33.3 g/dL (ref 32.0–36.0)
MCV: 80.7 fL (ref 80.0–100.0)
MONOS PCT: 15 %
Monocytes Absolute: 0.8 10*3/uL (ref 0.2–0.9)
NEUTROS ABS: 2.5 10*3/uL (ref 1.4–6.5)
NEUTROS PCT: 51 %
Platelets: 241 10*3/uL (ref 150–440)
RBC: 5.87 MIL/uL — ABNORMAL HIGH (ref 3.80–5.20)
RDW: 16.8 % — ABNORMAL HIGH (ref 11.5–14.5)
WBC: 5 10*3/uL (ref 3.6–11.0)

## 2016-12-13 LAB — RAPID INFLUENZA A&B ANTIGENS
Influenza A (ARMC): NEGATIVE
Influenza B (ARMC): NEGATIVE

## 2016-12-13 LAB — BASIC METABOLIC PANEL
ANION GAP: 9 (ref 5–15)
BUN: 14 mg/dL (ref 6–20)
CALCIUM: 9.2 mg/dL (ref 8.9–10.3)
CHLORIDE: 101 mmol/L (ref 101–111)
CO2: 23 mmol/L (ref 22–32)
Creatinine, Ser: 1.03 mg/dL — ABNORMAL HIGH (ref 0.44–1.00)
GFR calc non Af Amer: >60 mL/min (ref >60)
Glucose, Bld: 124 mg/dL — ABNORMAL HIGH (ref 65–99)
Potassium: 3.9 mmol/L (ref 3.5–5.1)
Sodium: 133 mmol/L — ABNORMAL LOW (ref 135–145)

## 2016-12-13 LAB — LACTATE DEHYDROGENASE: LDH: 147 U/L (ref 98–192)

## 2016-12-13 MED ORDER — AMOXICILLIN-POT CLAVULANATE 875-125 MG PO TABS
1.0000 | ORAL_TABLET | Freq: Once | ORAL | Status: AC
  Administered 2016-12-13: 1 via ORAL
  Filled 2016-12-13: qty 1

## 2016-12-13 MED ORDER — AMOXICILLIN-POT CLAVULANATE 875-125 MG PO TABS: 1.0000 | ORAL_TABLET | Freq: Two times a day (BID) | ORAL | 0 refills | Status: DC

## 2016-12-13 MED ORDER — ACETAMINOPHEN 500 MG PO TABS
ORAL_TABLET | ORAL | Status: AC
  Filled 2016-12-13: qty 2

## 2016-12-13 MED ORDER — ALBUTEROL SULFATE HFA 108 (90 BASE) MCG/ACT IN AERS: 1.0000 | INHALATION_SPRAY | Freq: Four times a day (QID) | RESPIRATORY_TRACT | 0 refills | Status: DC | PRN

## 2016-12-13 MED ORDER — ACETAMINOPHEN 500 MG PO TABS
1000.0000 mg | ORAL_TABLET | Freq: Once | ORAL | Status: AC
  Administered 2016-12-13: 1000 mg via ORAL

## 2016-12-13 MED ORDER — SODIUM CHLORIDE 0.9 % IV BOLUS (SEPSIS)
1000.0000 mL | Freq: Once | INTRAVENOUS | Status: AC
  Administered 2016-12-13: 1000 mL via INTRAVENOUS

## 2016-12-13 MED ORDER — AZITHROMYCIN 500 MG PO TABS
500.0000 mg | ORAL_TABLET | Freq: Once | ORAL | Status: AC
  Administered 2016-12-13: 500 mg via ORAL
  Filled 2016-12-13: qty 1

## 2016-12-13 MED ORDER — AZITHROMYCIN 250 MG PO TABS: 250.0000 mg | ORAL_TABLET | Freq: Every day | ORAL | 0 refills | Status: AC

## 2016-12-13 MED ORDER — ONDANSETRON 4 MG PO TBDP: 4.0000 mg | ORAL_TABLET | Freq: Once | ORAL | Status: DC

## 2016-12-13 MED ORDER — ONDANSETRON HCL 4 MG PO TABS
ORAL_TABLET | ORAL | Status: AC
  Filled 2016-12-13: qty 1

## 2016-12-13 MED ORDER — ACETAMINOPHEN 500 MG PO TABS: 1000.0000 mg | ORAL_TABLET | Freq: Once | ORAL | Status: DC

## 2016-12-13 MED ORDER — ONDANSETRON HCL 4 MG PO TABS
4.0000 mg | ORAL_TABLET | Freq: Once | ORAL | Status: AC
  Administered 2016-12-13: 4 mg via ORAL

## 2016-12-13 NOTE — ED Triage Notes (Signed)
Pt complains of cough, congestion, body aches and chills since Sunday, pt had one episode of vomiting after coughing

## 2016-12-13 NOTE — ED Provider Notes (Signed)
[facility] Emergency Department Provider Note  ____________________________________________  Time seen: Approximately 3:53 PM  I have reviewed the triage vital signs and the nursing notes.   HISTORY  Chief Complaint Cough and Emesis    HPI Meghan Jarvis is a 41 y.o. female , NAD, presents to the emergency department via EMS for evaluation of cough, congestion, body aches and nausea. Patient states over the last 2 days she has had cough, chest congestion worsening body aches. Had onset of nausea with occasional posttussive emesis today. Denies any abdominal pain, diarrhea. Has had no chest pain or shortness of breath. No severe generalized body aches from head to toe but has not noted any joint swelling. Also describes nasal congestion, runny nose and sinus pressure. His not been taking anything over-the-counter for her symptoms. She notes that she is HIV positive in that her counts have been undetectable over the last year. Has regular follow-up with her infectious disease provider and has an appointment scheduled in the next couple of months.   Past Medical History:  Diagnosis Date  . Asthma   . Depression   . HIV (human immunodeficiency virus infection) [facility])     Patient Active Problem List   Diagnosis Date Noted  . Alcohol use disorder, severe, dependence (HCC) 05/18/2016  . Alcohol abuse 05/17/2016    Past Surgical History:  Procedure Laterality Date  . ABDOMINAL HYSTERECTOMY    . APPENDECTOMY      Prior to Admission medications   Medication Sig Start Date End Date Taking? Authorizing Provider  albuterol (PROVENTIL HFA;VENTOLIN HFA) 108 (90 Base) MCG/ACT inhaler Inhale 1-2 puffs into the lungs every 6 (six) hours as needed for wheezing or shortness of breath. 12/13/16   Glorianna Gott L Michel Eskelson, PA-C  amoxicillin-clavulanate (AUGMENTIN) 875-125 MG tablet Take 1 tablet by mouth 2 (two) times daily. 12/13/16   Keyia Moretto L Amaro Mangold, PA-C  atazanavir-cobicistat (EVOTAZ)  300-150 MG tablet Take 1 tablet by mouth daily. Reported on 03/16/2016: HIV infection 05/18/16   Sanjuana Kava, NP  azithromycin (ZITHROMAX) 250 MG tablet Take 1 tablet (250 mg total) by mouth daily. 12/14/16 12/18/16  Arvel Oquinn L Avonda Toso, PA-C  clobetasol ointment (TEMOVATE) 0.05 % Apply 1 application topically 2 (two) times daily. For eye irritation 05/18/16   Sanjuana Kava, NP  DULoxetine (CYMBALTA) 60 MG capsule Take 1 capsule (60 mg total) by mouth daily. For depression 05/18/16   Sanjuana Kava, NP  emtricitabine-tenofovir AF (DESCOVY) 200-25 MG tablet Take 1 tablet by mouth daily. For HIV infection 05/18/16   Sanjuana Kava, NP  gabapentin (NEURONTIN) 300 MG capsule Take 1 capsule (300 mg total) by mouth 3 (three) times daily. For agitation/alcohol withdrawal syndrome 05/18/16   Sanjuana Kava, NP  meloxicam (MOBIC) 7.5 MG tablet Take 2 tablets (15 mg total) by mouth daily as needed for pain. 05/18/16   Sanjuana Kava, NP  ondansetron (ZOFRAN ODT) 8 MG disintegrating tablet Take 1 tablet (8 mg total) by mouth every 8 (eight) hours as needed for nausea or vomiting. 05/18/16   Sanjuana Kava, NP  polycarbophil (FIBERCON) 625 MG tablet Take 1 tablet (625 mg total) by mouth daily. For constipation 05/18/16   Sanjuana Kava, NP  traZODone (DESYREL) 100 MG tablet Take 2 tablets (200 mg total) by mouth at bedtime. For sleep 05/18/16   Sanjuana Kava, NP  Triamcinolone Acetonide (TRIAMCINOLONE 0.1 % CREAM : EUCERIN) CREA Apply 1 application topically 3 (three) times daily. 06/04/16  Delorise Royals Cuthriell, PA-C    Allergies Shellfish allergy; Buprenorphine hcl; and Morphine and related  Family History  Problem Relation Age of Onset  . Family history unknown: Yes    Social History Social History  Substance Use Topics  . Smoking status: Current Every Day Smoker    Packs/day: 1.00    Types: Cigarettes  . Smokeless tobacco: Never Used  . Alcohol use Yes     Review of Systems  Constitutional:Positive fatigue. No  fever/chills Eyes: No discharge ENT: Positive nasal congestion, runny nose. No ear pain, sinus pressure, sore throat. Cardiovascular: No chest pain. Respiratory: Positive cough, chest congestion. No shortness of breath, wheezing Gastrointestinal: Positive nausea, posttussive emesis. No abdominal pain. No diarrhea.  No constipation. Genitourinary: Negative for dysuria. No hematuria. No urinary hesitancy, urgency or increased frequency. Musculoskeletal: Positive for general myalgias. No joint swelling.  Skin: Negative for rash. Neurological: Negative for headaches. 10-point ROS otherwise negative.  ____________________________________________   PHYSICAL EXAM:  VITAL SIGNS: ED Triage Vitals  Enc Vitals Group     BP 12/13/16 1346 96/61     Pulse Rate 12/13/16 1346 (!) 101     Resp 12/13/16 1346 18     Temp 12/13/16 1346 99.2 F (37.3 C)     Temp Source 12/13/16 1346 Oral     SpO2 12/13/16 1346 97 %     Weight 12/13/16 1345 200 lb (90.7 kg)     Height 12/13/16 1345 5\' 8"  (1.727 m)     Head Circumference --      Peak Flow --      Pain Score --      Pain Loc --      Pain Edu? --      Excl. in GC? --      Constitutional: Alert and oriented. Well appearing and in no acute distress. Eyes: Conjunctivae are normal Without icterus or injection. Head: Atraumatic. ENT:      Ears: TMs visual eyes bilaterally without erythema, effusion, bulging or perforation.      Nose: Moderate congestion with trace clear rhinorrhea.      Mouth/Throat: Mucous membranes are moist. Nares without erythema, slight, exudate. Uvula is midline. Airway is patent. Neck: No stridor. Supple with full range of motion. Hematological/Lymphatic/Immunilogical: No cervical lymphadenopathy. Cardiovascular: Normal rate, regular rhythm. Normal S1 and S2.  Good peripheral circulation. Respiratory: Normal respiratory effort without tachypnea or retractions. Lungs CTAB breath sounds noted in all lung fields. No wheeze,  rhonchi, rales. Gastrointestinal: Soft and nontender without distention or guarding in all quadrants. No rebound or rigidity. Bowel sounds present and normoactive in all quadrants. Musculoskeletal: No lower extremity tenderness nor edema.  No joint effusions. Neurologic:  Normal speech and language. No gross focal neurologic deficits are appreciated.  Skin:  Skin is warm, dry and intact. No rash noted. Psychiatric: Mood and affect are normal. Speech and behavior are normal. Patient exhibits appropriate insight and judgement.   ____________________________________________   LABS (all labs ordered are listed, but only abnormal results are displayed)  Labs Reviewed  BASIC METABOLIC PANEL - Abnormal; Notable for the following:       Result Value   Sodium 133 (*)    Glucose, Bld 124 (*)    Creatinine, Ser 1.03 (*)    All other components within normal limits  CBC WITH DIFFERENTIAL/PLATELET - Abnormal; Notable for the following:    RBC 5.87 (*)    HCT 47.4 (*)    RDW 16.8 (*)    All  other components within normal limits  RAPID INFLUENZA A&B ANTIGENS ([facility] ONLY)  LACTATE DEHYDROGENASE   ____________________________________________  EKG  None ____________________________________________  RADIOLOGY I, Ernestene KielJami L Daryle Boyington, personally viewed and evaluated these images (plain radiographs) as part of my medical decision making, as well as reviewing the written report by the radiologist.  Dg Chest 2 View  Result Date: 12/13/2016 CLINICAL DATA:  Productive cough . EXAM: CHEST  2 VIEW COMPARISON:  09/22/2016. FINDINGS: Mediastinum hilar structures are normal. Mild left base subsegmental atelectasis and infiltrate noted. No pleural effusion or pneumothorax . No acute bony abnormality . IMPRESSION: Mild left base subsegmental atelectasis and infiltrate noted. Electronically Signed   By: Maisie Fushomas  Register   On: 12/13/2016 14:36     ____________________________________________    PROCEDURES  Procedure(s) performed: None   Procedures   Medications  acetaminophen (TYLENOL) tablet 1,000 mg (1,000 mg Oral Not Given 12/13/16 1645)  ondansetron (ZOFRAN-ODT) disintegrating tablet 4 mg (4 mg Oral Not Given 12/13/16 1645)  acetaminophen (TYLENOL) tablet 1,000 mg (1,000 mg Oral Given 12/13/16 1638)  ondansetron (ZOFRAN) tablet 4 mg (4 mg Oral Given 12/13/16 1638)  sodium chloride 0.9 % bolus 1,000 mL (0 mLs Intravenous Stopped 12/13/16 1802)  amoxicillin-clavulanate (AUGMENTIN) 875-125 MG per tablet 1 tablet (1 tablet Oral Given 12/13/16 1702)  azithromycin (ZITHROMAX) tablet 500 mg (500 mg Oral Given 12/13/16 1702)     ____________________________________________   INITIAL IMPRESSION / ASSESSMENT AND PLAN / ED COURSE  Pertinent labs & imaging results that were available during my care of the patient were reviewed by me and considered in my medical decision making (see chart for details).  Clinical Course as of Dec 13 1818  Wed Dec 13, 2016  1655 I spoke with Dr. Don PerkingVeronese and reverse the patient's presentation, physical examination, laboratory and x-ray results. She suggests that an LDH be completed and the patient given IV fluids. As long as LDH is not elevated, patient may be discharged home on Augmentin and azithromycin. Patient should follow up with her infectious disease physician by Friday for follow-up.  [JH]    Clinical Course User Index [JH] Delayla Hoffmaster L Tashawnda Bleiler, PA-C    Patient's diagnosis is consistent with Community-acquired pneumonia. Patient's lab evaluation was overall well-appearing. She was given a liter of normal saline while in the emergency department and tolerated well. Patient noted alleviation of nausea and significant decrease in muscle aches after being given Zofran and Tylenol. She was also given first doses of antibiotics while in the emergency department. Patient will be discharged home with  prescriptions for Augmentin, azithromycin and albuterol inhaler to use as directed. Patient is to [facility] within 48 hours for recheck. Patient verbalizes understanding that it is imperative for her to have good follow-up with her infectious disease physician and then if she has any worsening symptoms or onset of new symptoms that she is return to this emergency department immediately for further evaluation and treatment.    ____________________________________________  FINAL CLINICAL IMPRESSION(S) / ED DIAGNOSES  Final diagnoses:  Community acquired pneumonia of left lower lobe of lung (HCC)      NEW MEDICATIONS STARTED DURING THIS VISIT:  Discharge Medication List as of 12/13/2016  5:46 PM    START taking these medications   Details  albuterol (PROVENTIL HFA;VENTOLIN HFA) 108 (90 Base) MCG/ACT inhaler Inhale 1-2 puffs into the lungs every 6 (six) hours as needed for wheezing or shortness of breath., Starting Wed 12/13/2016, Print  amoxicillin-clavulanate (AUGMENTIN) 875-125 MG tablet Take 1 tablet by mouth 2 (two) times daily., Starting Wed 12/13/2016, Print    azithromycin (ZITHROMAX) 250 MG tablet Take 1 tablet (250 mg total) by mouth daily., Starting Thu 12/14/2016, Until Mon 12/18/2016, Print             Hope Pigeon, PA-C 12/13/16 1822    Nita Sickle, MD 12/13/16 1955

## 2016-12-13 NOTE — ED Triage Notes (Signed)
Pt comes into the ED via EMS from home with c/o cough with congestion and N/V for the past 2-3 days. Pt is HIV +

## 2016-12-13 NOTE — ED Notes (Signed)
See triage note  States she developed body aches with fever and chills yesterday   Low grade fever on arrival to ed  States cough is non prod but has vomited with cough

## 2017-09-29 ENCOUNTER — Emergency Department
Admission: EM | Admit: 2017-09-29 | Discharge: 2017-09-29 | Disposition: A | Discharge status: Home / Self Care | Payer: Medicaid Other | Attending: Emergency Medicine | Admitting: Emergency Medicine

## 2017-09-29 DIAGNOSIS — Z21 Asymptomatic human immunodeficiency virus [HIV] infection status: Secondary | ICD-10-CM | POA: Insufficient documentation

## 2017-09-29 DIAGNOSIS — R112 Nausea with vomiting, unspecified: Secondary | ICD-10-CM | POA: Insufficient documentation

## 2017-09-29 DIAGNOSIS — F17210 Nicotine dependence, cigarettes, uncomplicated: Secondary | ICD-10-CM | POA: Diagnosis not present

## 2017-09-29 DIAGNOSIS — J45909 Unspecified asthma, uncomplicated: Secondary | ICD-10-CM | POA: Diagnosis not present

## 2017-09-29 DIAGNOSIS — Z79899 Other long term (current) drug therapy: Secondary | ICD-10-CM | POA: Insufficient documentation

## 2017-09-29 DIAGNOSIS — K226 Gastro-esophageal laceration-hemorrhage syndrome: Secondary | ICD-10-CM | POA: Diagnosis not present

## 2017-09-29 DIAGNOSIS — F1010 Alcohol abuse, uncomplicated: Secondary | ICD-10-CM | POA: Diagnosis not present

## 2017-09-29 DIAGNOSIS — F329 Major depressive disorder, single episode, unspecified: Secondary | ICD-10-CM | POA: Diagnosis not present

## 2017-09-29 DIAGNOSIS — R1110 Vomiting, unspecified: Secondary | ICD-10-CM | POA: Diagnosis present

## 2017-09-29 LAB — CBC
HCT: 36.2 % (ref 35.0–47.0)
Hemoglobin: 12.1 g/dL (ref 12.0–16.0)
MCH: 27.8 pg (ref 26.0–34.0)
MCHC: 33.3 g/dL (ref 32.0–36.0)
MCV: 83.5 fL (ref 80.0–100.0)
PLATELETS: 253 10*3/uL (ref 150–440)
RBC: 4.33 MIL/uL (ref 3.80–5.20)
RDW: 16.8 % — AB (ref 11.5–14.5)
WBC: 8.1 10*3/uL (ref 3.6–11.0)

## 2017-09-29 LAB — URINALYSIS, COMPLETE (UACMP) WITH MICROSCOPIC
Bilirubin Urine: NEGATIVE
Glucose, UA: NEGATIVE mg/dL
Hgb urine dipstick: NEGATIVE
Ketones, ur: NEGATIVE mg/dL
LEUKOCYTES UA: NEGATIVE
Nitrite: NEGATIVE
PROTEIN: NEGATIVE mg/dL
SPECIFIC GRAVITY, URINE: 1.003 — AB (ref 1.005–1.030)
pH: 5 (ref 5.0–8.0)

## 2017-09-29 LAB — URINE DRUG SCREEN, QUALITATIVE (ARMC ONLY)
Amphetamines, Ur Screen: NOT DETECTED
BARBITURATES, UR SCREEN: NOT DETECTED
BENZODIAZEPINE, UR SCRN: NOT DETECTED
Cannabinoid 50 Ng, Ur ~~LOC~~: NOT DETECTED
Cocaine Metabolite,Ur ~~LOC~~: POSITIVE — AB
MDMA (Ecstasy)Ur Screen: NOT DETECTED
Methadone Scn, Ur: NOT DETECTED
OPIATE, UR SCREEN: NOT DETECTED
PHENCYCLIDINE (PCP) UR S: NOT DETECTED
Tricyclic, Ur Screen: NOT DETECTED

## 2017-09-29 LAB — BASIC METABOLIC PANEL
Anion gap: 9 (ref 5–15)
BUN: 9 mg/dL (ref 6–20)
CALCIUM: 8.7 mg/dL — AB (ref 8.9–10.3)
CHLORIDE: 107 mmol/L (ref 101–111)
CO2: 21 mmol/L — ABNORMAL LOW (ref 22–32)
CREATININE: 0.74 mg/dL (ref 0.44–1.00)
GFR calc Af Amer: >60 mL/min (ref >60)
GFR calc non Af Amer: >60 mL/min (ref >60)
Glucose, Bld: 102 mg/dL — ABNORMAL HIGH (ref 65–99)
Potassium: 3.5 mmol/L (ref 3.5–5.1)
SODIUM: 137 mmol/L (ref 135–145)

## 2017-09-29 LAB — PREGNANCY, URINE: Preg Test, Ur: NEGATIVE

## 2017-09-29 LAB — TROPONIN I

## 2017-09-29 MED ORDER — GI COCKTAIL ~~LOC~~
30.0000 mL | Freq: Once | ORAL | Status: AC
  Administered 2017-09-29: 30 mL via ORAL
  Filled 2017-09-29: qty 30

## 2017-09-29 MED ORDER — SODIUM CHLORIDE 0.9 % IV BOLUS (SEPSIS)
1000.0000 mL | Freq: Once | INTRAVENOUS | Status: AC
  Administered 2017-09-29: 1000 mL via INTRAVENOUS

## 2017-09-29 MED ORDER — ONDANSETRON HCL 4 MG PO TABS: 4.0000 mg | ORAL_TABLET | Freq: Three times a day (TID) | ORAL | 0 refills | Status: DC | PRN

## 2017-09-29 MED ORDER — KETOROLAC TROMETHAMINE 30 MG/ML IJ SOLN
30.0000 mg | Freq: Once | INTRAMUSCULAR | Status: AC
  Administered 2017-09-29: 30 mg via INTRAVENOUS
  Filled 2017-09-29: qty 1

## 2017-09-29 MED ORDER — ONDANSETRON HCL 4 MG/2ML IJ SOLN
4.0000 mg | Freq: Once | INTRAMUSCULAR | Status: AC
  Administered 2017-09-29: 4 mg via INTRAVENOUS
  Filled 2017-09-29: qty 2

## 2017-09-29 NOTE — ED Triage Notes (Signed)
Pt states 2 weeks of diarrhea, nausea, vomiting. Pt also complains syncope x2 in last 48 hours. Pt states she feels weak and dizzy, history of HIV.

## 2017-09-29 NOTE — ED Notes (Signed)
Mr. Meghan CarwinJoe Foust Jarvis [phone] Pt asked this RN to call Mr Roney MarionFoust to let him know she is here.    Mr. [name] [phone]

## 2017-09-29 NOTE — Discharge Instructions (Signed)
You were treated and evaluated for nausea and vomiting with some blood in it which as we discussed I suspect is due to irritation.  You are given IV fluids as well as nausea medicine here in the emergency department and her discharge with nausea medicine return to the emergency department immediately for any new or worsening issues including uncontrolled vomiting, new or worsening or increased amount of blood, black or bloody stools, fever, abdominal pain, or any other symptoms concerning to you.

## 2017-09-29 NOTE — ED Provider Notes (Signed)
[facility] Emergency Department Provider Note ____________________________________________   I have reviewed the triage vital signs and the triage nursing note.  HISTORY  Chief Complaint Abdominal Pain; Weakness; Emesis; Diarrhea; and Loss of Consciousness   Historian Patient  HPI Meghan Jarvis is a 41 y.o. female arrives by EMS with a complaint of vomiting and diarrhea for couple of days.  She states that she has been vomiting a lot and that she is also had some bloody streaks there.  She has had epigastric pain and burning.  Denies history of known gastritis or ulcers.  States that she thinks that she has the virus that is going around, and states "stomach virus."  She states that she has had loose stools, occasional blood in there.  Pain is moderate.  She states she continues to feel nauseated and she thinks might be dehydrated.   Past Medical History:  Diagnosis Date  . Asthma   . Depression   . HIV (human immunodeficiency virus infection) [facility](HCC)     Patient Active Problem List   Diagnosis Date Noted  . Alcohol use disorder, severe, dependence (HCC) 05/18/2016  . Alcohol abuse 05/17/2016    Past Surgical History:  Procedure Laterality Date  . ABDOMINAL HYSTERECTOMY    . APPENDECTOMY      Prior to Admission medications   Medication Sig Start Date End Date Taking? Authorizing Provider  albuterol (PROVENTIL HFA;VENTOLIN HFA) 108 (90 Base) MCG/ACT inhaler Inhale 1-2 puffs into the lungs every 6 (six) hours as needed for wheezing or shortness of breath. 12/13/16   Hagler, Jami L, PA-C  amoxicillin-clavulanate (AUGMENTIN) 875-125 MG tablet Take 1 tablet by mouth 2 (two) times daily. 12/13/16   Hagler, Jami L, PA-C  atazanavir-cobicistat (EVOTAZ) 300-150 MG tablet Take 1 tablet by mouth daily. Reported on 03/16/2016: HIV infection 05/18/16   Armandina StammerNwoko, Agnes I, NP  clobetasol ointment (TEMOVATE) 0.05 % Apply 1 application topically 2 (two) times daily. For eye  irritation 05/18/16   Armandina StammerNwoko, Agnes I, NP  DULoxetine (CYMBALTA) 60 MG capsule Take 1 capsule (60 mg total) by mouth daily. For depression 05/18/16   Armandina StammerNwoko, Agnes I, NP  emtricitabine-tenofovir AF (DESCOVY) 200-25 MG tablet Take 1 tablet by mouth daily. For HIV infection 05/18/16   Armandina StammerNwoko, Agnes I, NP  gabapentin (NEURONTIN) 300 MG capsule Take 1 capsule (300 mg total) by mouth 3 (three) times daily. For agitation/alcohol withdrawal syndrome 05/18/16   Armandina StammerNwoko, Agnes I, NP  meloxicam (MOBIC) 7.5 MG tablet Take 2 tablets (15 mg total) by mouth daily as needed for pain. 05/18/16   Armandina StammerNwoko, Agnes I, NP  ondansetron (ZOFRAN ODT) 8 MG disintegrating tablet Take 1 tablet (8 mg total) by mouth every 8 (eight) hours as needed for nausea or vomiting. 05/18/16   Armandina StammerNwoko, Agnes I, NP  ondansetron (ZOFRAN) 4 MG tablet Take 1 tablet (4 mg total) by mouth every 8 (eight) hours as needed for nausea or vomiting. 09/29/17   Governor RooksLord, Elajah Kunsman, MD  polycarbophil (FIBERCON) 625 MG tablet Take 1 tablet (625 mg total) by mouth daily. For constipation 05/18/16   Armandina StammerNwoko, Agnes I, NP  traZODone (DESYREL) 100 MG tablet Take 2 tablets (200 mg total) by mouth at bedtime. For sleep 05/18/16   Armandina StammerNwoko, Agnes I, NP  Triamcinolone Acetonide (TRIAMCINOLONE 0.1 % CREAM : EUCERIN) CREA Apply 1 application topically 3 (three) times daily. 06/04/16   Cuthriell, Delorise RoyalsJonathan D, PA-C    Allergies  Allergen Reactions  . Shellfish Allergy Other (See Comments)  Crab legs result in itching  . Buprenorphine Hcl Itching  . Morphine And Related Itching    Family History  Problem Relation Age of Onset  . Family history unknown: Yes    Social History Social History  Substance Use Topics  . Smoking status: Current Every Day Smoker    Packs/day: 1.00    Types: Cigarettes  . Smokeless tobacco: Never Used  . Alcohol use Yes    Review of Systems  Constitutional: Negative for fever. Eyes: Negative for visual changes. ENT: Negative for sore throat. Cardiovascular:  Negative for chest pain. Respiratory: Negative for shortness of breath. Gastrointestinal: Positive for abdominal pain, vomiting and diarrhea. Genitourinary: Negative for dysuria. Musculoskeletal: Negative for back pain. Skin: Negative for rash. Neurological: Negative for headache.  ____________________________________________   PHYSICAL EXAM:  VITAL SIGNS: ED Triage Vitals  Enc Vitals Group     BP 09/29/17 0700 104/77     Pulse Rate 09/29/17 0700 88     Resp 09/29/17 0700 20     Temp 09/29/17 0702 98.6 F (37 C)     Temp Source 09/29/17 0700 Oral     SpO2 09/29/17 0700 97 %     Weight 09/29/17 0700 187 lb 6.3 oz (85 kg)     Height 09/29/17 0700 5\' 8"  (1.727 m)     Head Circumference --      Peak Flow --      Pain Score 09/29/17 0659 10     Pain Loc --      Pain Edu? --      Excl. in GC? --      Constitutional: Alert and oriented. Well appearing and in no distress. HEENT   Head: Normocephalic and atraumatic.      Eyes: Conjunctivae are normal. Pupils equal and round.       Ears:         Nose: No congestion/rhinnorhea.   Mouth/Throat: Mucous membranes are moist.   Neck: No stridor. Cardiovascular/Chest: Normal rate, regular rhythm.  No murmurs, rubs, or gallops. Respiratory: Normal respiratory effort without tachypnea nor retractions. Breath sounds are clear and equal bilaterally. No wheezes/rales/rhonchi. Gastrointestinal: Soft. No distention, no guarding, no rebound.  Moderate tenderness diffusely especially more so over the epigastric area.  No focal right upper quadrant tenderness.  No focal right lower quadrant tenderness. Genitourinary/rectal: No hemorrhoids.  Brown stool, Hemoccult negative. Musculoskeletal: Nontender with normal range of motion in all extremities. No joint effusions.  No lower extremity tenderness.  No edema. Neurologic:  Normal speech and language. No gross or focal neurologic deficits are appreciated. Skin:  Skin is warm, dry and  intact. No rash noted. Psychiatric: Mood and affect are normal. Speech and behavior are normal. Patient exhibits appropriate insight and judgment.   ____________________________________________  LABS (pertinent positives/negatives) I, Governor Rooks, MD the attending physician have reviewed the labs noted below.  Labs Reviewed  BASIC METABOLIC PANEL - Abnormal; Notable for the following:       Result Value   CO2 21 (*)    Glucose, Bld 102 (*)    Calcium 8.7 (*)    All other components within normal limits  CBC - Abnormal; Notable for the following:    RDW 16.8 (*)    All other components within normal limits  URINE DRUG SCREEN, QUALITATIVE ([facility] ONLY) - Abnormal; Notable for the following:    Cocaine Metabolite,Ur [place] POSITIVE (*)    All other components within normal limits  URINALYSIS, COMPLETE (UACMP) WITH MICROSCOPIC -  Abnormal; Notable for the following:    Color, Urine STRAW (*)    APPearance CLEAR (*)    Specific Gravity, Urine 1.003 (*)    Bacteria, UA RARE (*)    Squamous Epithelial / LPF 0-5 (*)    All other components within normal limits  TROPONIN I  PREGNANCY, URINE    ____________________________________________    EKG I, Governor Rooks, MD, the attending physician have personally viewed and interpreted all ECGs.  87 bpm.  Normal sinus rhythm.  No acute spinal axis.  Nonspecific ST and T wave ____________________________________________  RADIOLOGY All Xrays were viewed by me.  Imaging interpreted by Radiologist, and I, Governor Rooks, MD the attending physician have reviewed the radiologist interpretation noted below.  None __________________________________________  PROCEDURES  Procedure(s) performed: None  Critical Care performed: None  ____________________________________________  No current facility-administered medications on file prior to encounter.    Current Outpatient Prescriptions on File Prior to Encounter  Medication Sig Dispense Refill   . albuterol (PROVENTIL HFA;VENTOLIN HFA) 108 (90 Base) MCG/ACT inhaler Inhale 1-2 puffs into the lungs every 6 (six) hours as needed for wheezing or shortness of breath. 1 Inhaler 0  . amoxicillin-clavulanate (AUGMENTIN) 875-125 MG tablet Take 1 tablet by mouth 2 (two) times daily. 20 tablet 0  . atazanavir-cobicistat (EVOTAZ) 300-150 MG tablet Take 1 tablet by mouth daily. Reported on 03/16/2016: HIV infection 1 tablet   . clobetasol ointment (TEMOVATE) 0.05 % Apply 1 application topically 2 (two) times daily. For eye irritation 30 g 0  . DULoxetine (CYMBALTA) 60 MG capsule Take 1 capsule (60 mg total) by mouth daily. For depression 30 capsule 0  . emtricitabine-tenofovir AF (DESCOVY) 200-25 MG tablet Take 1 tablet by mouth daily. For HIV infection 1 tablet 0  . gabapentin (NEURONTIN) 300 MG capsule Take 1 capsule (300 mg total) by mouth 3 (three) times daily. For agitation/alcohol withdrawal syndrome 60 capsule 0  . meloxicam (MOBIC) 7.5 MG tablet Take 2 tablets (15 mg total) by mouth daily as needed for pain.    Marland Kitchen ondansetron (ZOFRAN ODT) 8 MG disintegrating tablet Take 1 tablet (8 mg total) by mouth every 8 (eight) hours as needed for nausea or vomiting. 1 tablet 0  . polycarbophil (FIBERCON) 625 MG tablet Take 1 tablet (625 mg total) by mouth daily. For constipation    . traZODone (DESYREL) 100 MG tablet Take 2 tablets (200 mg total) by mouth at bedtime. For sleep 40 tablet 0  . Triamcinolone Acetonide (TRIAMCINOLONE 0.1 % CREAM : EUCERIN) CREA Apply 1 application topically 3 (three) times daily. 1 each 0    ____________________________________________  ED COURSE / ASSESSMENT AND PLAN  Pertinent labs & imaging results that were available during my care of the patient were reviewed by me and considered in my medical decision making (see chart for details).   Ms. Burrill is overall well-appearing complaining of vomiting diarrhea and abdominal pain which seems mostly located in the epigastrium.   She also reports some hematemesis which has a mild amount may be related to Mallory-Weiss.  Initiated some symptomatic relief and medical workup.   Evaluation is overall reassuring.  No significant electrolyte abnormalities or renal failure.  Normal white blood cell count.  Normal troponin.  Hemoglobin 12.  Most recent hg in care everywhere from 06/22/17 was 13.6.  Prior hg from January was 15.8 Seems drop is slow/chronic.  Heme checked here negative.  Recommend primary care follow up for anemia, but do not suspect significant gi bleed  or bleeding ulcer.  Patient UDS is positive for cocaine, unclear if this is related to patient's presentation or not.  Patient got some relief here.  I will discharge her with Zofran prescription.  DIFFERENTIAL DIAGNOSIS: Differential diagnosis includes, but is not limited to, biliary disease (biliary colic, acute cholecystitis, cholangitis, choledocholithiasis, etc), intrathoracic causes for epigastric abdominal pain including ACS, gastritis, duodenitis, pancreatitis, small bowel or large bowel obstruction, abdominal aortic aneurysm, hernia, and gastritis.  CONSULTATIONS:   None   Patient / Family / Caregiver informed of clinical course, medical decision-making process, and agree with plan.   I discussed return precautions, follow-up instructions, and discharge instructions with patient and/or family.  Discharge Instructions : You were treated and evaluated for nausea and vomiting with some blood in it which as we discussed I suspect is due to irritation.  You are given IV fluids as well as nausea medicine here in the emergency department and her discharge with nausea medicine return to the emergency department immediately for any new or worsening issues including uncontrolled vomiting, new or worsening or increased amount of blood, black or bloody stools, fever, abdominal pain, or any other symptoms concerning to  you.  ___________________________________________   FINAL CLINICAL IMPRESSION(S) / ED DIAGNOSES   Final diagnoses:  Non-intractable vomiting with nausea, unspecified vomiting type  Mallory-Weiss syndrome              Note: This dictation was prepared with Dragon dictation. Any transcriptional errors that result from this process are unintentional    Governor Rooks, MD 09/29/17 1118

## 2017-10-19 ENCOUNTER — Emergency Department: Payer: Medicaid Other

## 2017-10-19 ENCOUNTER — Emergency Department
Admission: EM | Admit: 2017-10-19 | Discharge: 2017-10-19 | Disposition: A | Discharge status: Home / Self Care | Payer: Medicaid Other | Attending: Student in an Organized Health Care Education/Training Program | Admitting: Student in an Organized Health Care Education/Training Program

## 2017-10-19 ENCOUNTER — Other Ambulatory Visit: Payer: Self-pay

## 2017-10-19 ENCOUNTER — Encounter: Payer: Self-pay | Admitting: *Deleted

## 2017-10-19 DIAGNOSIS — R05 Cough: Secondary | ICD-10-CM

## 2017-10-19 DIAGNOSIS — R0602 Shortness of breath: Secondary | ICD-10-CM

## 2017-10-19 DIAGNOSIS — R531 Weakness: Secondary | ICD-10-CM | POA: Insufficient documentation

## 2017-10-19 DIAGNOSIS — J45909 Unspecified asthma, uncomplicated: Secondary | ICD-10-CM | POA: Diagnosis not present

## 2017-10-19 DIAGNOSIS — B20 Human immunodeficiency virus [HIV] disease: Secondary | ICD-10-CM | POA: Diagnosis not present

## 2017-10-19 DIAGNOSIS — F17210 Nicotine dependence, cigarettes, uncomplicated: Secondary | ICD-10-CM | POA: Insufficient documentation

## 2017-10-19 DIAGNOSIS — R059 Cough, unspecified: Secondary | ICD-10-CM

## 2017-10-19 DIAGNOSIS — R109 Unspecified abdominal pain: Secondary | ICD-10-CM | POA: Diagnosis present

## 2017-10-19 DIAGNOSIS — Z79899 Other long term (current) drug therapy: Secondary | ICD-10-CM | POA: Diagnosis not present

## 2017-10-19 LAB — DIFFERENTIAL
Basophils Absolute: 0.1 10*3/uL (ref 0–0.1)
Basophils Relative: 2 %
EOS ABS: 0.1 10*3/uL (ref 0–0.7)
EOS PCT: 1 %
Lymphocytes Relative: 49 %
Lymphs Abs: 2.7 10*3/uL (ref 1.0–3.6)
Monocytes Absolute: 0.4 10*3/uL (ref 0.2–0.9)
Monocytes Relative: 7 %
NEUTROS PCT: 41 %
Neutro Abs: 2.2 10*3/uL (ref 1.4–6.5)

## 2017-10-19 LAB — HEPATIC FUNCTION PANEL
ALT: 17 U/L (ref 14–54)
AST: 20 U/L (ref 15–41)
Albumin: 3.2 g/dL — ABNORMAL LOW (ref 3.5–5.0)
Alkaline Phosphatase: 63 U/L (ref 38–126)
BILIRUBIN DIRECT: 0.2 mg/dL (ref 0.1–0.5)
BILIRUBIN INDIRECT: 1.4 mg/dL — AB (ref 0.3–0.9)
BILIRUBIN TOTAL: 1.6 mg/dL — AB (ref 0.3–1.2)
Total Protein: 7.5 g/dL (ref 6.5–8.1)

## 2017-10-19 LAB — BASIC METABOLIC PANEL
Anion gap: 7 (ref 5–15)
BUN: 18 mg/dL (ref 6–20)
CALCIUM: 8.9 mg/dL (ref 8.9–10.3)
CO2: 24 mmol/L (ref 22–32)
Chloride: 104 mmol/L (ref 101–111)
Creatinine, Ser: 0.82 mg/dL (ref 0.44–1.00)
GFR calc Af Amer: >60 mL/min (ref >60)
GLUCOSE: 97 mg/dL (ref 65–99)
Potassium: 4.2 mmol/L (ref 3.5–5.1)
Sodium: 135 mmol/L (ref 135–145)

## 2017-10-19 LAB — URINALYSIS, COMPLETE (UACMP) WITH MICROSCOPIC
BACTERIA UA: NONE SEEN
BILIRUBIN URINE: NEGATIVE
GLUCOSE, UA: NEGATIVE mg/dL
Hgb urine dipstick: NEGATIVE
KETONES UR: NEGATIVE mg/dL
Leukocytes, UA: NEGATIVE
NITRITE: NEGATIVE
PH: 7 (ref 5.0–8.0)
Protein, ur: NEGATIVE mg/dL
SPECIFIC GRAVITY, URINE: 1.005 (ref 1.005–1.030)

## 2017-10-19 LAB — LIPASE, BLOOD: LIPASE: 31 U/L (ref 11–51)

## 2017-10-19 LAB — CBC
HEMATOCRIT: 37.1 % (ref 35.0–47.0)
Hemoglobin: 12.1 g/dL (ref 12.0–16.0)
MCH: 27.3 pg (ref 26.0–34.0)
MCHC: 32.5 g/dL (ref 32.0–36.0)
MCV: 83.9 fL (ref 80.0–100.0)
Platelets: 265 10*3/uL (ref 150–440)
RBC: 4.42 MIL/uL (ref 3.80–5.20)
RDW: 16.6 % — AB (ref 11.5–14.5)
WBC: 5.7 10*3/uL (ref 3.6–11.0)

## 2017-10-19 MED ORDER — ACETAMINOPHEN 325 MG PO TABS
650.0000 mg | ORAL_TABLET | Freq: Once | ORAL | Status: AC
  Administered 2017-10-19: 650 mg via ORAL
  Filled 2017-10-19: qty 2

## 2017-10-19 MED ORDER — PREDNISONE 20 MG PO TABS: 40.0000 mg | ORAL_TABLET | Freq: Every day | ORAL | 0 refills | Status: AC

## 2017-10-19 MED ORDER — ALBUTEROL SULFATE HFA 108 (90 BASE) MCG/ACT IN AERS: 2.0000 | INHALATION_SPRAY | Freq: Four times a day (QID) | RESPIRATORY_TRACT | 2 refills | Status: DC | PRN

## 2017-10-19 MED ORDER — PROMETHAZINE HCL 25 MG/ML IJ SOLN: 12.5000 mg | Freq: Four times a day (QID) | INTRAMUSCULAR | Status: DC | PRN

## 2017-10-19 MED ORDER — IPRATROPIUM-ALBUTEROL 0.5-2.5 (3) MG/3ML IN SOLN
3.0000 mL | Freq: Once | RESPIRATORY_TRACT | Status: AC
  Administered 2017-10-19: 3 mL via RESPIRATORY_TRACT
  Filled 2017-10-19: qty 3

## 2017-10-19 MED ORDER — PREDNISONE 20 MG PO TABS
60.0000 mg | ORAL_TABLET | Freq: Once | ORAL | Status: AC
  Administered 2017-10-19: 60 mg via ORAL
  Filled 2017-10-19: qty 3

## 2017-10-19 MED ORDER — SODIUM CHLORIDE 0.9 % IV BOLUS (SEPSIS)
1000.0000 mL | Freq: Once | INTRAVENOUS | Status: AC
  Administered 2017-10-19: 1000 mL via INTRAVENOUS

## 2017-10-19 MED ORDER — PROCHLORPERAZINE EDISYLATE 5 MG/ML IJ SOLN
10.0000 mg | Freq: Once | INTRAMUSCULAR | Status: AC
  Administered 2017-10-19: 10 mg via INTRAVENOUS
  Filled 2017-10-19: qty 2

## 2017-10-19 NOTE — Discharge Instructions (Signed)

## 2017-10-19 NOTE — ED Notes (Signed)
Pt given phone to call for ride.

## 2017-10-19 NOTE — ED Provider Notes (Signed)
[facility] Emergency Department Provider Note    First MD Initiated Contact with Patient 10/19/17 1748     (approximate)  I have reviewed the triage vital signs and the nursing notes.   HISTORY  Chief Complaint Weakness and Flank Pain    HPI Meghan LeysBarbara A Rozenberg is a 41 y.o. female with a history of HIV under detectable viral load presents with back pain lightheadedness malaise and increased urinary frequency for the past 3 days.  States she is also developed a headache.  Has had a nonproductive cough.  Does have a history of asthma.  No recent antibiotics.  Denies any vaginal discharge.  Denies any history of kidney stones but states that she is having right greater than left flank pain.  Past Medical History:  Diagnosis Date  . Asthma   . Depression   . HIV (human immunodeficiency virus infection) (HCC)    Family History  Family history unknown: Yes   Past Surgical History:  Procedure Laterality Date  . ABDOMINAL HYSTERECTOMY    . APPENDECTOMY     Patient Active Problem List   Diagnosis Date Noted  . Alcohol use disorder, severe, dependence (HCC) 05/18/2016  . Alcohol abuse 05/17/2016      Prior to Admission medications   Medication Sig Start Date End Date Taking? Authorizing Provider  albuterol (PROVENTIL HFA;VENTOLIN HFA) 108 (90 Base) MCG/ACT inhaler Inhale 1-2 puffs into the lungs every 6 (six) hours as needed for wheezing or shortness of breath. 12/13/16   Hagler, Jami L, PA-C  albuterol (PROVENTIL HFA;VENTOLIN HFA) 108 (90 Base) MCG/ACT inhaler Inhale 2 puffs every 6 (six) hours as needed into the lungs for wheezing or shortness of breath. 10/19/17   Willy Eddyobinson, Madisyn Mawhinney, MD  amoxicillin-clavulanate (AUGMENTIN) 875-125 MG tablet Take 1 tablet by mouth 2 (two) times daily. 12/13/16   Hagler, Jami L, PA-C  atazanavir-cobicistat (EVOTAZ) 300-150 MG tablet Take 1 tablet by mouth daily. Reported on 03/16/2016: HIV infection 05/18/16   Armandina StammerNwoko, Agnes I, NP    clobetasol ointment (TEMOVATE) 0.05 % Apply 1 application topically 2 (two) times daily. For eye irritation 05/18/16   Armandina StammerNwoko, Agnes I, NP  DULoxetine (CYMBALTA) 60 MG capsule Take 1 capsule (60 mg total) by mouth daily. For depression 05/18/16   Armandina StammerNwoko, Agnes I, NP  emtricitabine-tenofovir AF (DESCOVY) 200-25 MG tablet Take 1 tablet by mouth daily. For HIV infection 05/18/16   Armandina StammerNwoko, Agnes I, NP  gabapentin (NEURONTIN) 300 MG capsule Take 1 capsule (300 mg total) by mouth 3 (three) times daily. For agitation/alcohol withdrawal syndrome 05/18/16   Armandina StammerNwoko, Agnes I, NP  meloxicam (MOBIC) 7.5 MG tablet Take 2 tablets (15 mg total) by mouth daily as needed for pain. 05/18/16   Armandina StammerNwoko, Agnes I, NP  ondansetron (ZOFRAN ODT) 8 MG disintegrating tablet Take 1 tablet (8 mg total) by mouth every 8 (eight) hours as needed for nausea or vomiting. 05/18/16   Armandina StammerNwoko, Agnes I, NP  ondansetron (ZOFRAN) 4 MG tablet Take 1 tablet (4 mg total) by mouth every 8 (eight) hours as needed for nausea or vomiting. 09/29/17   Governor RooksLord, Rebecca, MD  polycarbophil (FIBERCON) 625 MG tablet Take 1 tablet (625 mg total) by mouth daily. For constipation 05/18/16   Armandina StammerNwoko, Agnes I, NP  predniSONE (DELTASONE) 20 MG tablet Take 2 tablets (40 mg total) daily for 5 days by mouth. 10/19/17 10/24/17  Willy Eddyobinson, Tempestt Silba, MD  traZODone (DESYREL) 100 MG tablet Take 2 tablets (200 mg total) by mouth at bedtime.  For sleep 05/18/16   Armandina StammerNwoko, Agnes I, NP  Triamcinolone Acetonide (TRIAMCINOLONE 0.1 % CREAM : EUCERIN) CREA Apply 1 application topically 3 (three) times daily. 06/04/16   Cuthriell, Delorise RoyalsJonathan D, PA-C    Allergies Shellfish allergy; Buprenorphine hcl; and Morphine and related    Social History Social History   Tobacco Use  . Smoking status: Current Every Day Smoker    Packs/day: 1.00    Types: Cigarettes  . Smokeless tobacco: Never Used  Substance Use Topics  . Alcohol use: Yes  . Drug use: No    Review of Systems Patient denies headaches,  rhinorrhea, blurry vision, numbness, shortness of breath, chest pain, edema, cough, abdominal pain, nausea, vomiting, diarrhea, dysuria, fevers, rashes or hallucinations unless otherwise stated above in HPI. ____________________________________________   PHYSICAL EXAM:  VITAL SIGNS: Vitals:   10/19/17 1844 10/19/17 2021  BP: 111/90 113/83  Pulse: 80 75  Resp: (!) 24 19  SpO2: 100% 100%    Constitutional: Alert and oriented. ill appearing but in no acute distress. Eyes: Conjunctivae are normal.  Head: Atraumatic. Nose: No congestion/rhinnorhea. Mouth/Throat: Mucous membranes are moist.   Neck: No stridor. Painless ROM.  Cardiovascular: Normal rate, regular rhythm. Grossly normal heart sounds.  Good peripheral circulation. Respiratory: Normal respiratory effort.  No retractions. Lungs with coarse bibasilar breath sounds Gastrointestinal: Soft and nontender. No distention. No abdominal bruits. R>L CVA tenderness. Genitourinary:  Musculoskeletal: No lower extremity tenderness nor edema.  No joint effusions. Neurologic:  Normal speech and language. No gross focal neurologic deficits are appreciated. No facial droop Skin:  Skin is warm, dry and intact. No rash noted. Psychiatric: Mood and affect are normal. Speech and behavior are normal.  ____________________________________________   LABS (all labs ordered are listed, but only abnormal results are displayed)  Results for orders placed or performed during the hospital encounter of 10/19/17 (from the past 24 hour(s))  Basic metabolic panel     Status: None   Collection Time: 10/19/17  5:47 PM  Result Value Ref Range   Sodium 135 135 - 145 mmol/L   Potassium 4.2 3.5 - 5.1 mmol/L   Chloride 104 101 - 111 mmol/L   CO2 24 22 - 32 mmol/L   Glucose, Bld 97 65 - 99 mg/dL   BUN 18 6 - 20 mg/dL   Creatinine, Ser 1.610.82 0.44 - 1.00 mg/dL   Calcium 8.9 8.9 - 09.610.3 mg/dL   GFR calc non Af Amer >60 >60 mL/min   GFR calc Af Amer >60 >60  mL/min   Anion gap 7 5 - 15  CBC     Status: Abnormal   Collection Time: 10/19/17  5:47 PM  Result Value Ref Range   WBC 5.7 3.6 - 11.0 K/uL   RBC 4.42 3.80 - 5.20 MIL/uL   Hemoglobin 12.1 12.0 - 16.0 g/dL   HCT 04.537.1 40.935.0 - 81.147.0 %   MCV 83.9 80.0 - 100.0 fL   MCH 27.3 26.0 - 34.0 pg   MCHC 32.5 32.0 - 36.0 g/dL   RDW 91.416.6 (H) 78.211.5 - 95.614.5 %   Platelets 265 150 - 440 K/uL  Urinalysis, Complete w Microscopic     Status: Abnormal   Collection Time: 10/19/17  5:47 PM  Result Value Ref Range   Color, Urine STRAW (A) YELLOW   APPearance CLEAR (A) CLEAR   Specific Gravity, Urine 1.005 1.005 - 1.030   pH 7.0 5.0 - 8.0   Glucose, UA NEGATIVE NEGATIVE mg/dL   Hgb urine dipstick  NEGATIVE NEGATIVE   Bilirubin Urine NEGATIVE NEGATIVE   Ketones, ur NEGATIVE NEGATIVE mg/dL   Protein, ur NEGATIVE NEGATIVE mg/dL   Nitrite NEGATIVE NEGATIVE   Leukocytes, UA NEGATIVE NEGATIVE   RBC / HPF 0-5 0 - 5 RBC/hpf   WBC, UA 0-5 0 - 5 WBC/hpf   Bacteria, UA NONE SEEN NONE SEEN   Squamous Epithelial / LPF 0-5 (A) NONE SEEN  Hepatic function panel     Status: Abnormal   Collection Time: 10/19/17  5:47 PM  Result Value Ref Range   Total Protein 7.5 6.5 - 8.1 g/dL   Albumin 3.2 (L) 3.5 - 5.0 g/dL   AST 20 15 - 41 U/L   ALT 17 14 - 54 U/L   Alkaline Phosphatase 63 38 - 126 U/L   Total Bilirubin 1.6 (H) 0.3 - 1.2 mg/dL   Bilirubin, Direct 0.2 0.1 - 0.5 mg/dL   Indirect Bilirubin 1.4 (H) 0.3 - 0.9 mg/dL  Lipase, blood     Status: None   Collection Time: 10/19/17  5:47 PM  Result Value Ref Range   Lipase 31 11 - 51 U/L  Differential     Status: None   Collection Time: 10/19/17  5:47 PM  Result Value Ref Range   Neutrophils Relative % 41 %   Neutro Abs 2.2 1.4 - 6.5 K/uL   Lymphocytes Relative 49 %   Lymphs Abs 2.7 1.0 - 3.6 K/uL   Monocytes Relative 7 %   Monocytes Absolute 0.4 0.2 - 0.9 K/uL   Eosinophils Relative 1 %   Eosinophils Absolute 0.1 0 - 0.7 K/uL   Basophils Relative 2 %   Basophils  Absolute 0.1 0 - 0.1 K/uL   ____________________________________________  EKG My review and personal interpretation at Time: 17:53   Indication: weakness  Rate: 75  Rhythm: sinus Axis: normal Other: normal intervals, no stemi, non specific t wave changes ____________________________________________  RADIOLOGY  I personally reviewed all radiographic images ordered to evaluate for the above acute complaints and reviewed radiology reports and findings.  These findings were personally discussed with the patient.  Please see medical record for radiology report.  ____________________________________________   PROCEDURES  Procedure(s) performed:  Procedures    Critical Care performed: no ____________________________________________   INITIAL IMPRESSION / ASSESSMENT AND PLAN / ED COURSE  Pertinent labs & imaging results that were available during my care of the patient were reviewed by me and considered in my medical decision making (see chart for details).  DDX: pna, uti, pyelo, stone, appy, viral illness, electrolyte abn  DARIANA GARBETT is a 41 y.o. who presents to the ED with symptoms as described above.  Patient is ill-appearing but not critically so.  Her abdominal exam is soft benign but does have bilateral CVA tenderness and is describing dysuria.  Based on her right CVA tenderness with description of dark colored urine will order CT imaging to evaluate for stone or other acute intra-abdominal process.  We will check blood work for the above differential.  We will provide IV antiemetics as well as Tylenol.  We will give IV fluids.  Clinical Course as of Oct 20 2335  Caleen Essex Oct 19, 2017  1935 Patient reassessed.  States she feels much improved.  Tolerating oral hydration.  Vital signs remained stable.  I do suspect some component of bronchitis given her wheezing improvement after nebulizer treatment.  Urinalysis is clear given her symptoms I will send this for culture but do not  feel that  she requires antibiotics at this time.  Do believe that she is stable and appropriate for follow-up as an outpatient.  [PR]    Clinical Course User Index [PR] Willy Eddy, MD     ____________________________________________   FINAL CLINICAL IMPRESSION(S) / ED DIAGNOSES  Final diagnoses:  Flank pain  Cough  SOB (shortness of breath)      NEW MEDICATIONS STARTED DURING THIS VISIT:  This SmartLink is deprecated. Use AVSMEDLIST instead to display the medication list for a patient.   Note:  This document was prepared using Dragon voice recognition software and may include unintentional dictation errors.    Willy Eddy, MD 10/19/17 [phone]

## 2017-10-19 NOTE — ED Triage Notes (Signed)
PT to ED reporting weakness, dizziness and urinary frequency with bilateral flank pain x 3 days. Pt reports urine is dark in color with a foul odor. PT reports she has been able to eat w=but has a decreased appetite.. No fevers reported at home..Marland Kitchen

## 2017-11-28 ENCOUNTER — Encounter: Payer: Self-pay | Admitting: Emergency Medicine

## 2017-11-28 ENCOUNTER — Other Ambulatory Visit: Payer: Self-pay

## 2017-11-28 ENCOUNTER — Emergency Department
Admission: EM | Admit: 2017-11-28 | Discharge: 2017-11-28 | Disposition: A | Discharge status: Home / Self Care | Payer: Medicaid Other | Attending: Emergency Medicine | Admitting: Emergency Medicine

## 2017-11-28 DIAGNOSIS — F17210 Nicotine dependence, cigarettes, uncomplicated: Secondary | ICD-10-CM | POA: Diagnosis not present

## 2017-11-28 DIAGNOSIS — B20 Human immunodeficiency virus [HIV] disease: Secondary | ICD-10-CM | POA: Insufficient documentation

## 2017-11-28 DIAGNOSIS — Z79899 Other long term (current) drug therapy: Secondary | ICD-10-CM | POA: Insufficient documentation

## 2017-11-28 DIAGNOSIS — R1032 Left lower quadrant pain: Secondary | ICD-10-CM | POA: Insufficient documentation

## 2017-11-28 DIAGNOSIS — R109 Unspecified abdominal pain: Secondary | ICD-10-CM

## 2017-11-28 DIAGNOSIS — J45909 Unspecified asthma, uncomplicated: Secondary | ICD-10-CM | POA: Diagnosis not present

## 2017-11-28 LAB — CBC WITH DIFFERENTIAL/PLATELET
BASOS ABS: 0.1 10*3/uL (ref 0–0.1)
Basophils Relative: 1 %
EOS PCT: 1 %
Eosinophils Absolute: 0.1 10*3/uL (ref 0–0.7)
HEMATOCRIT: 39.3 % (ref 35.0–47.0)
HEMOGLOBIN: 12.7 g/dL (ref 12.0–16.0)
LYMPHS ABS: 2.2 10*3/uL (ref 1.0–3.6)
LYMPHS PCT: 43 %
MCH: 27.3 pg (ref 26.0–34.0)
MCHC: 32.3 g/dL (ref 32.0–36.0)
MCV: 84.7 fL (ref 80.0–100.0)
Monocytes Absolute: 0.5 10*3/uL (ref 0.2–0.9)
Monocytes Relative: 9 %
NEUTROS ABS: 2.3 10*3/uL (ref 1.4–6.5)
NEUTROS PCT: 46 %
Platelets: 246 10*3/uL (ref 150–440)
RBC: 4.64 MIL/uL (ref 3.80–5.20)
RDW: 14.8 % — ABNORMAL HIGH (ref 11.5–14.5)
WBC: 5.1 10*3/uL (ref 3.6–11.0)

## 2017-11-28 LAB — URINALYSIS, COMPLETE (UACMP) WITH MICROSCOPIC
BACTERIA UA: NONE SEEN
Bilirubin Urine: NEGATIVE
GLUCOSE, UA: NEGATIVE mg/dL
HGB URINE DIPSTICK: NEGATIVE
Ketones, ur: NEGATIVE mg/dL
NITRITE: NEGATIVE
PROTEIN: NEGATIVE mg/dL
SPECIFIC GRAVITY, URINE: 1.019 (ref 1.005–1.030)
pH: 6 (ref 5.0–8.0)

## 2017-11-28 LAB — COMPREHENSIVE METABOLIC PANEL
ALK PHOS: 60 U/L (ref 38–126)
ALT: 13 U/L — AB (ref 14–54)
AST: 18 U/L (ref 15–41)
Albumin: 3.7 g/dL (ref 3.5–5.0)
Anion gap: 6 (ref 5–15)
BILIRUBIN TOTAL: 0.6 mg/dL (ref 0.3–1.2)
BUN: 14 mg/dL (ref 6–20)
CALCIUM: 9 mg/dL (ref 8.9–10.3)
CO2: 26 mmol/L (ref 22–32)
CREATININE: 0.79 mg/dL (ref 0.44–1.00)
Chloride: 105 mmol/L (ref 101–111)
Glucose, Bld: 82 mg/dL (ref 65–99)
Potassium: 4 mmol/L (ref 3.5–5.1)
Sodium: 137 mmol/L (ref 135–145)
Total Protein: 7.6 g/dL (ref 6.5–8.1)

## 2017-11-28 MED ORDER — HALOPERIDOL LACTATE 5 MG/ML IJ SOLN
5.0000 mg | Freq: Once | INTRAMUSCULAR | Status: AC
  Administered 2017-11-28: 5 mg via INTRAVENOUS
  Filled 2017-11-28: qty 1

## 2017-11-28 MED ORDER — DICYCLOMINE HCL 10 MG/ML IM SOLN
20.0000 mg | Freq: Once | INTRAMUSCULAR | Status: AC
  Administered 2017-11-28: 20 mg via INTRAMUSCULAR
  Filled 2017-11-28: qty 2

## 2017-11-28 MED ORDER — KETOROLAC TROMETHAMINE 30 MG/ML IJ SOLN
30.0000 mg | Freq: Once | INTRAMUSCULAR | Status: AC
  Administered 2017-11-28: 30 mg via INTRAVENOUS
  Filled 2017-11-28: qty 1

## 2017-11-28 MED ORDER — DICYCLOMINE HCL 20 MG PO TABS: 20.0000 mg | ORAL_TABLET | Freq: Three times a day (TID) | ORAL | 0 refills | Status: DC | PRN

## 2017-11-28 MED ORDER — SUCRALFATE 1 G PO TABS: 1.0000 g | ORAL_TABLET | Freq: Four times a day (QID) | ORAL | 0 refills | Status: DC

## 2017-11-28 NOTE — ED Provider Notes (Signed)
[facility] Emergency Department Provider Note   ____________________________________________   I have reviewed the triage vital signs and the nursing notes.   HISTORY  Chief Complaint Flank Pain   History limited by: Not Limited   HPI Meghan Jarvis is a 41 y.o. female who presents to the emergency department today because of concern for left flank pain.  LOCATION:left flank DURATION:1 day TIMING: constant SEVERITY: severe QUALITY: sharp CONTEXT: patient denies any unusual activity.  MODIFYING FACTORS: none ASSOCIATED SYMPTOMS: states she has seen some blood in urine. Denies fevers, has had chills.  Per medical record review patient has a history of ER visit one month ago for right sided flank pain. Had CT renal study done at that time without any obvious stone or etiology of the pain.  Past Medical History:  Diagnosis Date  . Asthma   . Depression   . HIV (human immunodeficiency virus infection) [facility](HCC)     Patient Active Problem List   Diagnosis Date Noted  . Alcohol use disorder, severe, dependence (HCC) 05/18/2016  . Alcohol abuse 05/17/2016    Past Surgical History:  Procedure Laterality Date  . ABDOMINAL HYSTERECTOMY    . APPENDECTOMY      Prior to Admission medications   Medication Sig Start Date End Date Taking? Authorizing Provider  albuterol (PROVENTIL HFA;VENTOLIN HFA) 108 (90 Base) MCG/ACT inhaler Inhale 1-2 puffs into the lungs every 6 (six) hours as needed for wheezing or shortness of breath. 12/13/16   Hagler, Jami L, PA-C  albuterol (PROVENTIL HFA;VENTOLIN HFA) 108 (90 Base) MCG/ACT inhaler Inhale 2 puffs every 6 (six) hours as needed into the lungs for wheezing or shortness of breath. 10/19/17   Willy Eddyobinson, Patrick, MD  amoxicillin-clavulanate (AUGMENTIN) 875-125 MG tablet Take 1 tablet by mouth 2 (two) times daily. 12/13/16   Hagler, Jami L, PA-C  atazanavir-cobicistat (EVOTAZ) 300-150 MG tablet Take 1 tablet by mouth daily.  Reported on 03/16/2016: HIV infection 05/18/16   Armandina StammerNwoko, Agnes I, NP  clobetasol ointment (TEMOVATE) 0.05 % Apply 1 application topically 2 (two) times daily. For eye irritation 05/18/16   Armandina StammerNwoko, Agnes I, NP  DULoxetine (CYMBALTA) 60 MG capsule Take 1 capsule (60 mg total) by mouth daily. For depression 05/18/16   Armandina StammerNwoko, Agnes I, NP  emtricitabine-tenofovir AF (DESCOVY) 200-25 MG tablet Take 1 tablet by mouth daily. For HIV infection 05/18/16   Armandina StammerNwoko, Agnes I, NP  gabapentin (NEURONTIN) 300 MG capsule Take 1 capsule (300 mg total) by mouth 3 (three) times daily. For agitation/alcohol withdrawal syndrome 05/18/16   Armandina StammerNwoko, Agnes I, NP  meloxicam (MOBIC) 7.5 MG tablet Take 2 tablets (15 mg total) by mouth daily as needed for pain. 05/18/16   Armandina StammerNwoko, Agnes I, NP  ondansetron (ZOFRAN ODT) 8 MG disintegrating tablet Take 1 tablet (8 mg total) by mouth every 8 (eight) hours as needed for nausea or vomiting. 05/18/16   Armandina StammerNwoko, Agnes I, NP  ondansetron (ZOFRAN) 4 MG tablet Take 1 tablet (4 mg total) by mouth every 8 (eight) hours as needed for nausea or vomiting. 09/29/17   Governor RooksLord, Rebecca, MD  polycarbophil (FIBERCON) 625 MG tablet Take 1 tablet (625 mg total) by mouth daily. For constipation 05/18/16   Armandina StammerNwoko, Agnes I, NP  traZODone (DESYREL) 100 MG tablet Take 2 tablets (200 mg total) by mouth at bedtime. For sleep 05/18/16   Armandina StammerNwoko, Agnes I, NP  Triamcinolone Acetonide (TRIAMCINOLONE 0.1 % CREAM : EUCERIN) CREA Apply 1 application topically 3 (three) times daily. 06/04/16  Cuthriell, Delorise RoyalsJonathan D, PA-C    Allergies Shellfish allergy; Buprenorphine hcl; and Morphine and related  Family History  Family history unknown: Yes    Social History Social History   Tobacco Use  . Smoking status: Current Every Day Smoker    Packs/day: 1.00    Types: Cigarettes  . Smokeless tobacco: Never Used  Substance Use Topics  . Alcohol use: Yes  . Drug use: No    Review of Systems Constitutional: No fever/chills Eyes: No visual  changes. ENT: No sore throat. Cardiovascular: Denies chest pain. Respiratory: Denies shortness of breath. Gastrointestinal: Positive for left flank pain. Genitourinary: Negative for dysuria. Musculoskeletal: Negative for back pain. Skin: Negative for rash. Neurological: Negative for headaches, focal weakness or numbness.  ____________________________________________   PHYSICAL EXAM:  VITAL SIGNS: ED Triage Vitals  Enc Vitals Group     BP 11/28/17 0838 118/86     Pulse Rate 11/28/17 0838 78     Resp 11/28/17 0838 20     Temp 11/28/17 0838 98.1 F (36.7 C)     Temp Source 11/28/17 0838 Oral     SpO2 11/28/17 0838 99 %     Weight 11/28/17 0839 190 lb (86.2 kg)     Height 11/28/17 0839 5\' 8"  (1.727 m)     Head Circumference --      Peak Flow --      Pain Score 11/28/17 0837 10   Constitutional: Alert and oriented.  Eyes: Conjunctivae are normal.  ENT   Head: Normocephalic and atraumatic.   Nose: No congestion/rhinnorhea.   Mouth/Throat: Mucous membranes are moist.   Neck: No stridor. Hematological/Lymphatic/Immunilogical: No cervical lymphadenopathy. Cardiovascular: Normal rate, regular rhythm.  No murmurs, rubs, or gallops. Respiratory: Normal respiratory effort without tachypnea nor retractions. Breath sounds are clear and equal bilaterally. No wheezes/rales/rhonchi. Gastrointestinal: Soft and tender to palpation in the left side of the abdomen.  No rebound. No guarding.  Genitourinary: Deferred Musculoskeletal: Normal range of motion in all extremities. No lower extremity edema. Neurologic:  Normal speech and language. No gross focal neurologic deficits are appreciated.  Skin:  Skin is warm, dry and intact. No rash noted. Psychiatric: Mood and affect are normal. Speech and behavior are normal. Patient exhibits appropriate insight and judgment.  ____________________________________________    LABS (pertinent positives/negatives)  CBC wnl except rdw  14.8 CMP wnl except alt 13 UA not consistent with infection  ____________________________________________   EKG  None  ____________________________________________    RADIOLOGY  None  ____________________________________________   PROCEDURES  Procedures  ____________________________________________   INITIAL IMPRESSION / ASSESSMENT AND PLAN / ED COURSE  Pertinent labs & imaging results that were available during my care of the patient were reviewed by me and considered in my medical decision making (see chart for details).  Patient presented to the emergency department today because of concerns for left flank and abdominal pain.  Differential is broad and includes diverticulitis, colitis, kidney stones, urinary tract infection, ovarian issues, pregnancy issues amongst other etiologies.  Patient's workup did not show any leukocytosis.  She was afebrile.  Urinalysis not convincing for infection or stone.  Additionally patient did undergo a CT scan roughly 1 month ago for flank pain which was negative for stones.  We did try multiple medications here in the emergency department patient did feel improvement.  At this point do not feel any emergent imaging is necessary.  Will give patient prescriptions and have her follow-up with her primary care doctors.  Discussed findings and plan  with patient.   ____________________________________________   FINAL CLINICAL IMPRESSION(S) / ED DIAGNOSES  Final diagnoses:  Left flank pain     Note: This dictation was prepared with Dragon dictation. Any transcriptional errors that result from this process are unintentional     Phineas Semen, MD 11/28/17 1331

## 2017-11-28 NOTE — Discharge Instructions (Signed)
Please seek medical attention for any high fevers, chest pain, shortness of breath, change in behavior, persistent vomiting, bloody stool or any other new or concerning symptoms.  

## 2017-11-28 NOTE — ED Notes (Signed)
Pt calling out multiple times reporting pain is worse. Have notified dr Derrill Kaygoodman. Informed her that mediation ordered must come from pharmacy and has not arrived yet. She would like to know if further testing is going to be done and see the doctor. Dr Derrill Kaygoodman notified she would like to see her.

## 2017-11-28 NOTE — ED Triage Notes (Signed)
Pt c/o left flank pain that radiates to LLQ. Mild hematuria. No fevers but chills.

## 2017-12-27 IMAGING — CR DG CHEST 2V
2 series · 2 of 2 positions shown · non-contrast
Comparison: 09/04/2016

CLINICAL DATA: Cough 2 weeks

EXAM:
CHEST  2 VIEW

[chest pa]
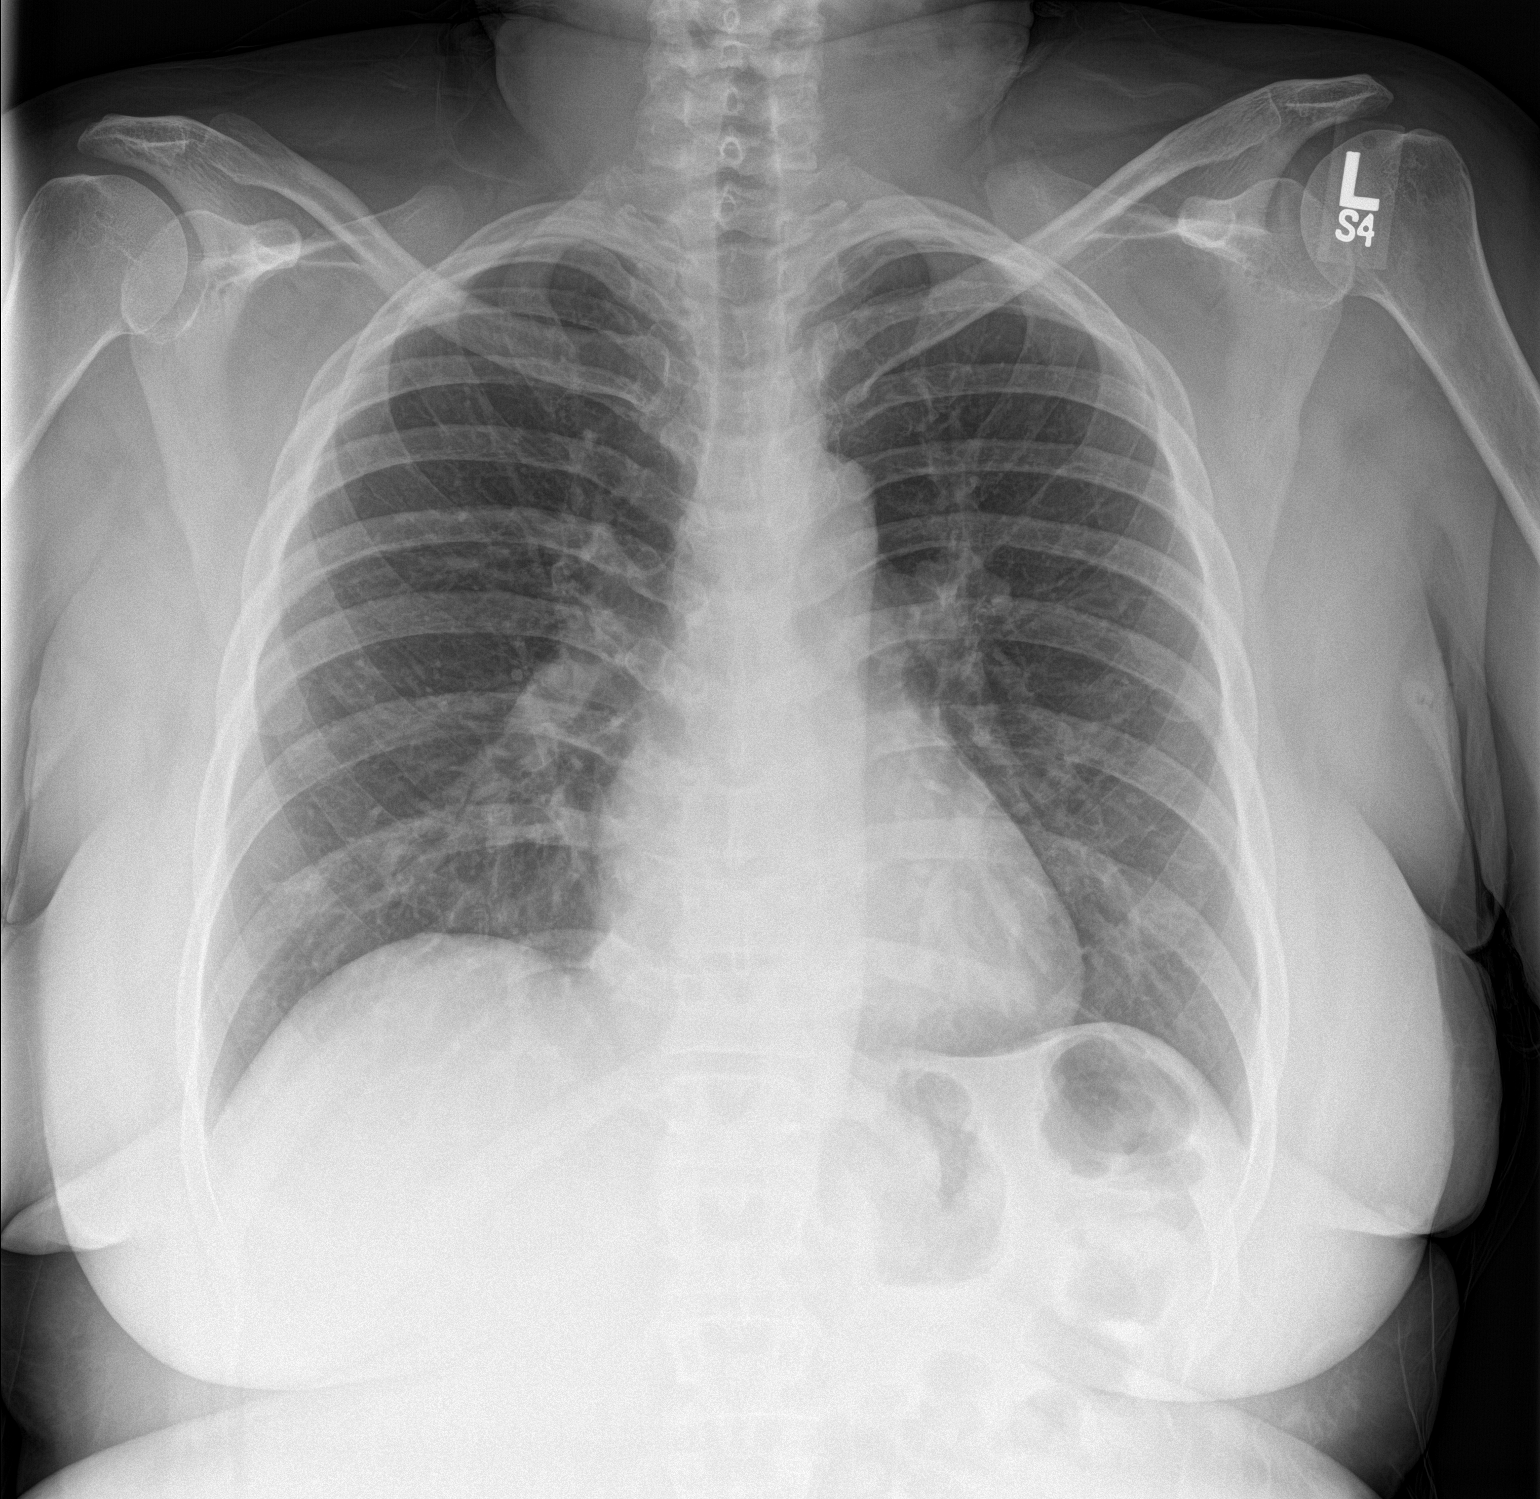

[chest lat]
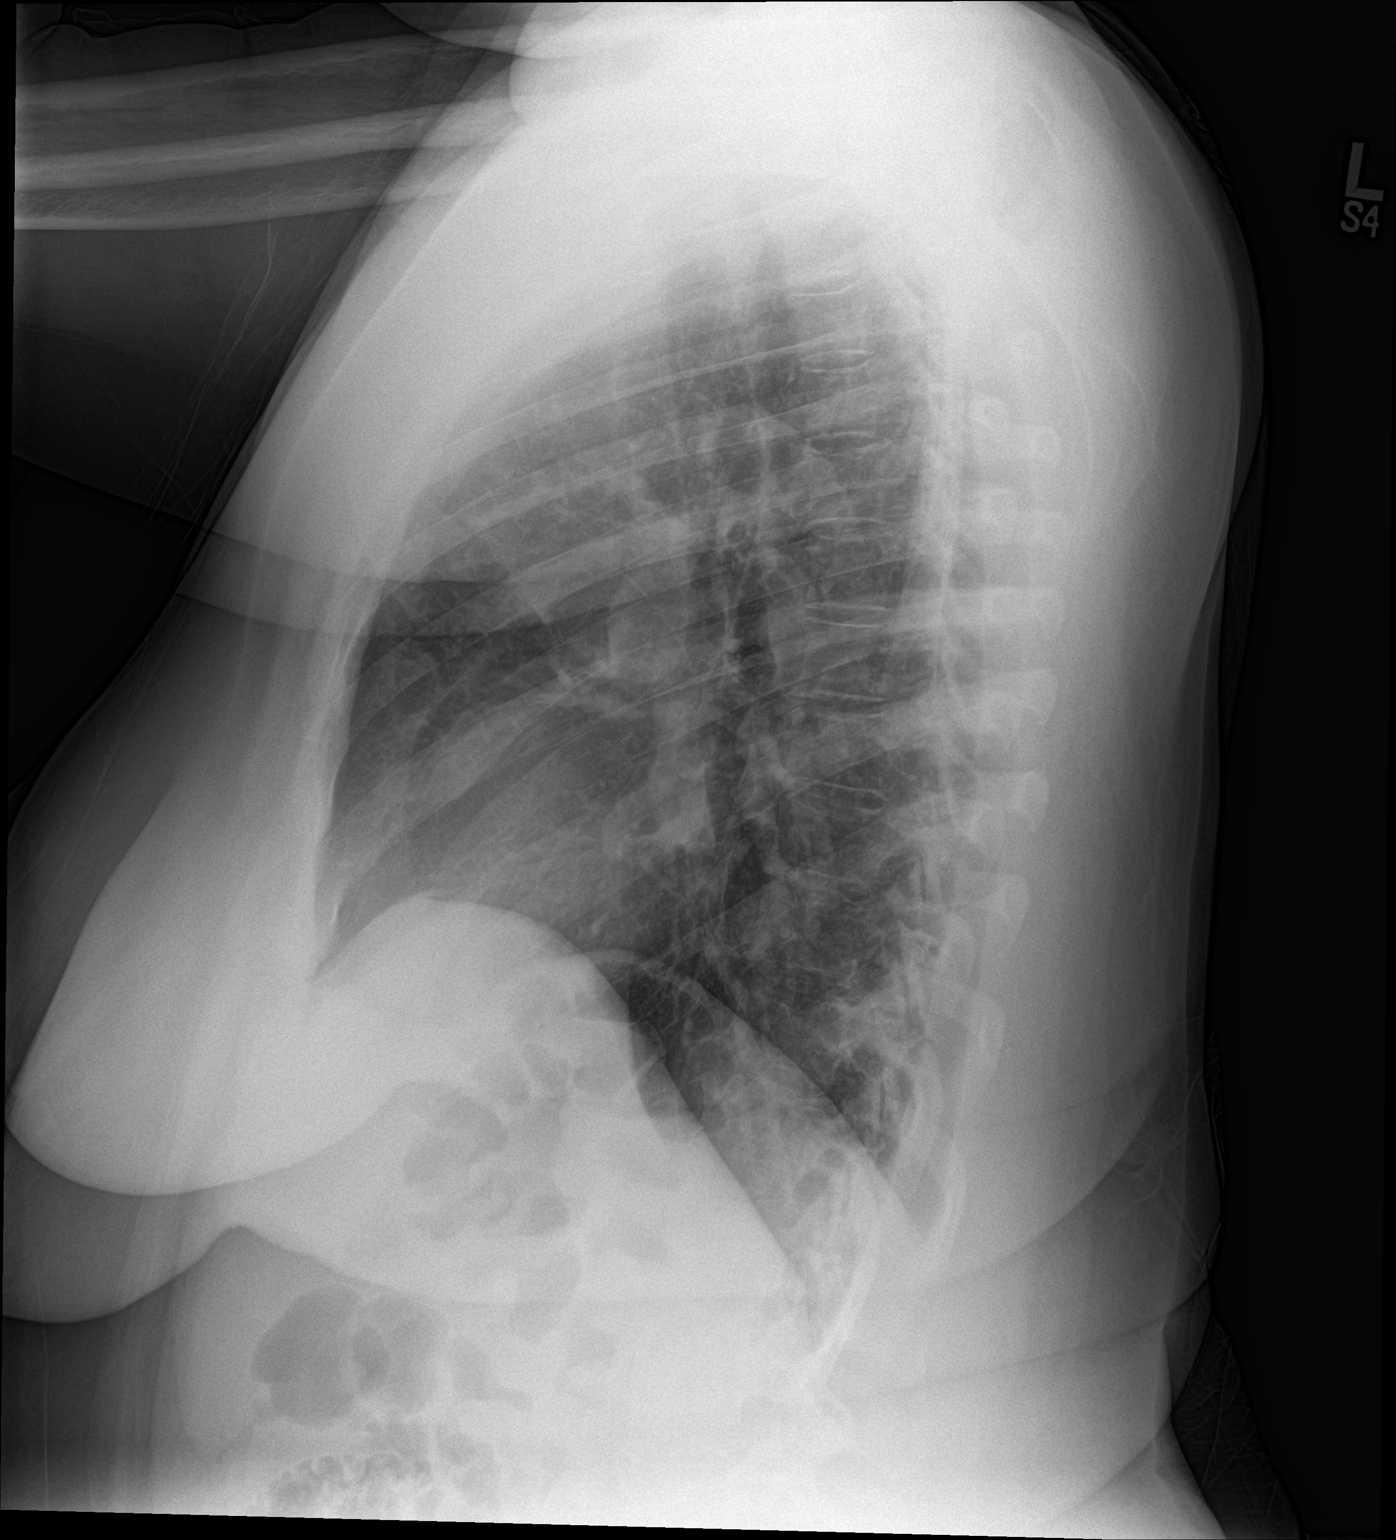

[2 of 2 positions shown; findings below may reference images not displayed]

FINDINGS: The heart size and mediastinal contours are within normal limits.
Both lungs are clear. The visualized skeletal structures are
unremarkable.
IMPRESSION: No active cardiopulmonary disease.

## 2018-11-23 ENCOUNTER — Encounter: Payer: Self-pay | Admitting: Emergency Medicine

## 2018-11-23 ENCOUNTER — Emergency Department
Admission: EM | Admit: 2018-11-23 | Discharge: 2018-11-24 | Disposition: A | Discharge status: Home / Self Care | Payer: Medicaid Other | Attending: Emergency Medicine | Admitting: Emergency Medicine

## 2018-11-23 ENCOUNTER — Other Ambulatory Visit: Payer: Self-pay

## 2018-11-23 DIAGNOSIS — N739 Female pelvic inflammatory disease, unspecified: Secondary | ICD-10-CM | POA: Diagnosis not present

## 2018-11-23 DIAGNOSIS — Z79899 Other long term (current) drug therapy: Secondary | ICD-10-CM | POA: Insufficient documentation

## 2018-11-23 DIAGNOSIS — F17210 Nicotine dependence, cigarettes, uncomplicated: Secondary | ICD-10-CM | POA: Diagnosis not present

## 2018-11-23 DIAGNOSIS — R1030 Lower abdominal pain, unspecified: Secondary | ICD-10-CM | POA: Diagnosis present

## 2018-11-23 DIAGNOSIS — N760 Acute vaginitis: Secondary | ICD-10-CM | POA: Insufficient documentation

## 2018-11-23 DIAGNOSIS — R102 Pelvic and perineal pain: Secondary | ICD-10-CM | POA: Insufficient documentation

## 2018-11-23 DIAGNOSIS — N730 Acute parametritis and pelvic cellulitis: Secondary | ICD-10-CM

## 2018-11-23 DIAGNOSIS — J45909 Unspecified asthma, uncomplicated: Secondary | ICD-10-CM | POA: Diagnosis not present

## 2018-11-23 DIAGNOSIS — B9689 Other specified bacterial agents as the cause of diseases classified elsewhere: Secondary | ICD-10-CM

## 2018-11-23 LAB — URINALYSIS, COMPLETE (UACMP) WITH MICROSCOPIC
BILIRUBIN URINE: NEGATIVE
Bacteria, UA: NONE SEEN
Glucose, UA: NEGATIVE mg/dL
HGB URINE DIPSTICK: NEGATIVE
KETONES UR: NEGATIVE mg/dL
Leukocytes, UA: NEGATIVE
NITRITE: NEGATIVE
PROTEIN: NEGATIVE mg/dL
Specific Gravity, Urine: 1.012 (ref 1.005–1.030)
pH: 7 (ref 5.0–8.0)

## 2018-11-23 LAB — COMPREHENSIVE METABOLIC PANEL
ALK PHOS: 50 U/L (ref 38–126)
ALT: 25 U/L (ref 0–44)
AST: 26 U/L (ref 15–41)
Albumin: 3.5 g/dL (ref 3.5–5.0)
Anion gap: 7 (ref 5–15)
BUN: 14 mg/dL (ref 6–20)
CALCIUM: 8.8 mg/dL — AB (ref 8.9–10.3)
CO2: 23 mmol/L (ref 22–32)
Chloride: 107 mmol/L (ref 98–111)
Creatinine, Ser: 0.88 mg/dL (ref 0.44–1.00)
GFR calc non Af Amer: >60 mL/min (ref >60)
Glucose, Bld: 101 mg/dL — ABNORMAL HIGH (ref 70–99)
Potassium: 4 mmol/L (ref 3.5–5.1)
SODIUM: 137 mmol/L (ref 135–145)
Total Bilirubin: 0.4 mg/dL (ref 0.3–1.2)
Total Protein: 8 g/dL (ref 6.5–8.1)

## 2018-11-23 LAB — POCT PREGNANCY, URINE: Preg Test, Ur: NEGATIVE

## 2018-11-23 LAB — CBC
HCT: 40.6 % (ref 36.0–46.0)
Hemoglobin: 12.8 g/dL (ref 12.0–15.0)
MCH: 26.7 pg (ref 26.0–34.0)
MCHC: 31.5 g/dL (ref 30.0–36.0)
MCV: 84.6 fL (ref 80.0–100.0)
NRBC: 0 % (ref 0.0–0.2)
Platelets: 209 10*3/uL (ref 150–400)
RBC: 4.8 MIL/uL (ref 3.87–5.11)
RDW: 16.5 % — AB (ref 11.5–15.5)
WBC: 5.4 10*3/uL (ref 4.0–10.5)

## 2018-11-23 LAB — LIPASE, BLOOD: Lipase: 47 U/L (ref 11–51)

## 2018-11-23 MED ORDER — SODIUM CHLORIDE 0.9 % IV BOLUS
1000.0000 mL | Freq: Once | INTRAVENOUS | Status: AC
  Administered 2018-11-24: 1000 mL via INTRAVENOUS

## 2018-11-23 MED ORDER — HALOPERIDOL LACTATE 5 MG/ML IJ SOLN
2.5000 mg | Freq: Once | INTRAMUSCULAR | Status: AC
  Administered 2018-11-24: 2.5 mg via INTRAVENOUS
  Filled 2018-11-23: qty 1

## 2018-11-23 MED ORDER — FENTANYL CITRATE (PF) 100 MCG/2ML IJ SOLN
100.0000 ug | Freq: Once | INTRAMUSCULAR | Status: AC
  Administered 2018-11-24: 100 ug via INTRAVENOUS
  Filled 2018-11-23: qty 2

## 2018-11-23 NOTE — ED Triage Notes (Signed)
Pt arrives via [facility] with c/o abdominal pain x 2 days with no emesis. Per EMS, pt VS [facility]. Pt states that it hurts right after she eats. Pt is in NAD.

## 2018-11-23 NOTE — ED Provider Notes (Signed)
[facility] Emergency Department Provider Note  ____________________________________________   First MD Initiated Contact with Patient 11/23/18 2332     (approximate)  I have reviewed the triage vital signs and the nursing notes.   HISTORY  Chief Complaint Abdominal Pain   HPI Meghan Jarvis is a 42 y.o. female comes to the emergency department via EMS with 2 days of lower abdominal pain.  The pain is primarily postprandial.  It is suprapubic and in her lower abdomen.  It was gradual onset and now constant.  Nothing in particular seems to make it better.  She is had some nausea but no vomiting.  No dysuria frequency or hesitance.  She has noted a small amount of vaginal discharge.   She does have a past medical history of HIV with a last known CD4 count of 413 earlier this year.  She denies previous abdominal surgical history however on chart review she has had an abdominal hysterectomy and appendectomy.  Her symptoms are nonradiating.   Past Medical History:  Diagnosis Date  . Asthma   . Depression   . HIV (human immunodeficiency virus infection) [facility])     Patient Active Problem List   Diagnosis Date Noted  . Alcohol use disorder, severe, dependence (HCC) 05/18/2016  . Alcohol abuse 05/17/2016    Past Surgical History:  Procedure Laterality Date  . ABDOMINAL HYSTERECTOMY    . APPENDECTOMY      Prior to Admission medications   Medication Sig Start Date End Date Taking? Authorizing Provider  albuterol (PROVENTIL HFA;VENTOLIN HFA) 108 (90 Base) MCG/ACT inhaler Inhale 1-2 puffs into the lungs every 6 (six) hours as needed for wheezing or shortness of breath. 12/13/16   Hagler, Jami L, PA-C  amoxicillin-clavulanate (AUGMENTIN) 875-125 MG tablet Take 1 tablet by mouth 2 (two) times daily. Patient not taking: Reported on 11/28/2017 12/13/16   Hagler, Jami L, PA-C  atazanavir-cobicistat (EVOTAZ) 300-150 MG tablet Take 1 tablet by mouth daily. Reported on  03/16/2016: HIV infection 05/18/16   Armandina Stammer I, NP  clobetasol ointment (TEMOVATE) 0.05 % Apply 1 application topically 2 (two) times daily. For eye irritation 05/18/16   Armandina Stammer I, NP  dicyclomine (BENTYL) 20 MG tablet Take 1 tablet (20 mg total) by mouth 3 (three) times daily as needed (abdominal pain). 11/28/17   Phineas Semen, MD  doxycycline (VIBRA-TABS) 100 MG tablet Take 1 tablet (100 mg total) by mouth 2 (two) times daily for 14 days. 11/24/18 12/08/18  Merrily Brittle, MD  DULoxetine (CYMBALTA) 60 MG capsule Take 1 capsule (60 mg total) by mouth daily. For depression 05/18/16   Armandina Stammer I, NP  emtricitabine-tenofovir AF (DESCOVY) 200-25 MG tablet Take 1 tablet by mouth daily. For HIV infection 05/18/16   Armandina Stammer I, NP  gabapentin (NEURONTIN) 300 MG capsule Take 1 capsule (300 mg total) by mouth 3 (three) times daily. For agitation/alcohol withdrawal syndrome 05/18/16   Armandina Stammer I, NP  HYDROcodone-acetaminophen (NORCO) 5-325 MG tablet Take 1 tablet by mouth every 6 (six) hours as needed for up to 7 doses for severe pain. 11/24/18   Merrily Brittle, MD  meloxicam (MOBIC) 7.5 MG tablet Take 2 tablets (15 mg total) by mouth daily as needed for pain. 05/18/16   Armandina Stammer I, NP  metroNIDAZOLE (FLAGYL) 500 MG tablet Take 1 tablet (500 mg total) by mouth 2 (two) times daily for 14 days. 11/24/18 12/08/18  Merrily Brittle, MD  ondansetron (ZOFRAN ODT) 8 MG disintegrating tablet Take  1 tablet (8 mg total) by mouth every 8 (eight) hours as needed for nausea or vomiting. 05/18/16   Armandina Stammer I, NP  ondansetron (ZOFRAN) 4 MG tablet Take 1 tablet (4 mg total) by mouth every 8 (eight) hours as needed for nausea or vomiting. 09/29/17   Governor Rooks, MD  polycarbophil (FIBERCON) 625 MG tablet Take 1 tablet (625 mg total) by mouth daily. For constipation 05/18/16   Armandina Stammer I, NP  sucralfate (CARAFATE) 1 g tablet Take 1 tablet (1 g total) by mouth 4 (four) times daily. 11/28/17   Phineas Semen, MD  traZODone (DESYREL) 100 MG tablet Take 2 tablets (200 mg total) by mouth at bedtime. For sleep 05/18/16   Armandina Stammer I, NP  Triamcinolone Acetonide (TRIAMCINOLONE 0.1 % CREAM : EUCERIN) CREA Apply 1 application topically 3 (three) times daily. 06/04/16   Cuthriell, Delorise Royals, PA-C    Allergies Shellfish allergy; Buprenorphine hcl; and Morphine and related  Family History  Family history unknown: Yes    Social History Social History   Tobacco Use  . Smoking status: Current Every Day Smoker    Packs/day: 1.00    Types: Cigarettes  . Smokeless tobacco: Never Used  Substance Use Topics  . Alcohol use: Yes  . Drug use: No    Review of Systems Constitutional: No fever/chills Eyes: No visual changes. ENT: No sore throat. Cardiovascular: Denies chest pain. Respiratory: Denies shortness of breath. Gastrointestinal: Positive for abdominal pain.  Positive for nausea, no vomiting.  No diarrhea.  No constipation. Genitourinary: Positive for vaginal discharge Musculoskeletal: Negative for back pain. Skin: Negative for rash. Neurological: Negative for headaches, focal weakness or numbness.   ____________________________________________   PHYSICAL EXAM:  VITAL SIGNS: ED Triage Vitals  Enc Vitals Group     BP 11/23/18 2256 (!) 114/91     Pulse Rate 11/23/18 2256 79     Resp 11/23/18 2256 18     Temp 11/23/18 2256 98.4 F (36.9 C)     Temp Source 11/23/18 2256 Oral     SpO2 11/23/18 2256 97 %     Weight 11/23/18 2257 220 lb (99.8 kg)     Height 11/23/18 2257 5\' 8"  (1.727 m)     Head Circumference --      Peak Flow --      Pain Score 11/23/18 2252 10     Pain Loc --      Pain Edu? --      Excl. in GC? --     Constitutional: Alert and oriented x4 tearful and very uncomfortable appearing Eyes: PERRL EOMI. Head: Atraumatic. Nose: No congestion/rhinnorhea. Mouth/Throat: No trismus Neck: No stridor.   Cardiovascular: Normal rate, regular rhythm. Grossly  normal heart sounds.  Good peripheral circulation. Respiratory: Normal respiratory effort.  No retractions. Lungs CTAB and moving good air Gastrointestinal: Obese abdomen somewhat distended with diffuse tenderness.  Pain is nonlocalizing.  Some rebound although no obvious peritonitis Pelvic exam performed with female nurse Demetrio Lapping as chaperone: Normal external exam.  Copious amount of thick white discharge in the vault.  Office is closed.  She does have cervical motion tenderness along with right adnexal tenderness Musculoskeletal: No lower extremity edema   Neurologic:  Normal speech and language. No gross focal neurologic deficits are appreciated. Skin:  Skin is warm, dry and intact. No rash noted. Psychiatric: Mood and affect are normal. Speech and behavior are normal.    ____________________________________________   DIFFERENTIAL includes but not limited to  Bowel  obstruction, pyelonephritis, nephrolithiasis, cholecystitis, pelvic inflammatory disease, tubo-ovarian abscess ____________________________________________   LABS (all labs ordered are listed, but only abnormal results are displayed)  Labs Reviewed  WET PREP, GENITAL - Abnormal; Notable for the following components:      Result Value   Clue Cells Wet Prep HPF POC PRESENT (*)    WBC, Wet Prep HPF POC RARE (*)    All other components within normal limits  COMPREHENSIVE METABOLIC PANEL - Abnormal; Notable for the following components:   Glucose, Bld 101 (*)    Calcium 8.8 (*)    All other components within normal limits  CBC - Abnormal; Notable for the following components:   RDW 16.5 (*)    All other components within normal limits  URINALYSIS, COMPLETE (UACMP) WITH MICROSCOPIC - Abnormal; Notable for the following components:   Color, Urine STRAW (*)    APPearance CLEAR (*)    All other components within normal limits  CHLAMYDIA/NGC RT PCR ([facility] ONLY)  LIPASE, BLOOD  POC URINE PREG, ED  POCT PREGNANCY,  URINE    Lab work reviewed by me shows clue cells consistent with bacterial vaginosis __________________________________________  EKG   ____________________________________________  RADIOLOGY  CT abdomen pelvis reviewed by me with no clear etiology of the patient's symptoms identified ____________________________________________   PROCEDURES  Procedure(s) performed: no  Procedures  Critical Care performed: no  ____________________________________________   INITIAL IMPRESSION / ASSESSMENT AND PLAN / ED COURSE  Pertinent labs & imaging results that were available during my care of the patient were reviewed by me and considered in my medical decision making (see chart for details).   As part of my medical decision making, I reviewed the following data within the electronic MEDICAL RECORD NUMBER History obtained from family if available, nursing notes, old chart and ekg, as well as notes from prior ED visits.  Patient comes to the emergency department tearful and very uncomfortable appearing with a diffusely tender abdomen.  She does report lower abdominal pain and denies previous surgical history however on chart review she has had a previous appendectomy and hysterectomy.  Broad lab work is pending and I will give her a liter of saline, 100 mcg of IV fentanyl, and 2.5 mg of IV haloperidol for pain and nausea.  I appreciate that she is HIV positive however does not have AIDS and this should not affect her current work-up or treatment.     ----------------------------------------- 2:51 AM on 11/24/2018 -----------------------------------------  CT reassuring.  Patient now reporting lower pelvic pain.  Wet mount and GC chlamydia are pending.  The patient had a copious amount of discharge in her vault along with clue cells and whites.  She also has cervical motion tenderness raising concern for pelvic inflammatory disease.  I will presumptively treat her with ceftriaxone and  azithromycin and Flagyl and she understands to refrain from sexual intercourse for 2 weeks.  She is discharged home in significantly improved condition. ____________________________________________   FINAL CLINICAL IMPRESSION(S) / ED DIAGNOSES  Final diagnoses:  PID (acute pelvic inflammatory disease)  Bacterial vaginosis      NEW MEDICATIONS STARTED DURING THIS VISIT:  Discharge Medication List as of 11/24/2018  5:23 AM    START taking these medications   Details  doxycycline (VIBRA-TABS) 100 MG tablet Take 1 tablet (100 mg total) by mouth 2 (two) times daily for 14 days., Starting Sun 11/24/2018, Until Sun 12/08/2018, Print    HYDROcodone-acetaminophen (NORCO) 5-325 MG tablet Take 1 tablet by mouth every 6 (  six) hours as needed for up to 7 doses for severe pain., Starting Sun 11/24/2018, Print    metroNIDAZOLE (FLAGYL) 500 MG tablet Take 1 tablet (500 mg total) by mouth 2 (two) times daily for 14 days., Starting Sun 11/24/2018, Until Sun 12/08/2018, Print         Note:  This document was prepared using Dragon voice recognition software and may include unintentional dictation errors.     Merrily Brittleifenbark, Tayte Mcwherter, MD 11/25/18 [phone]

## 2018-11-24 ENCOUNTER — Encounter: Payer: Self-pay | Admitting: Radiology

## 2018-11-24 ENCOUNTER — Emergency Department: Payer: Medicaid Other

## 2018-11-24 LAB — CHLAMYDIA/NGC RT PCR (ARMC ONLY)
CHLAMYDIA TR: NOT DETECTED
N GONORRHOEAE: NOT DETECTED

## 2018-11-24 LAB — WET PREP, GENITAL
Sperm: NONE SEEN
TRICH WET PREP: NONE SEEN
Yeast Wet Prep HPF POC: NONE SEEN

## 2018-11-24 MED ORDER — METRONIDAZOLE 500 MG PO TABS: 500.0000 mg | ORAL_TABLET | Freq: Two times a day (BID) | ORAL | 0 refills | Status: AC

## 2018-11-24 MED ORDER — HYDROCODONE-ACETAMINOPHEN 5-325 MG PO TABS: 1.0000 | ORAL_TABLET | Freq: Four times a day (QID) | ORAL | 0 refills | Status: DC | PRN

## 2018-11-24 MED ORDER — AZITHROMYCIN 500 MG PO TABS
1000.0000 mg | ORAL_TABLET | Freq: Once | ORAL | Status: AC
  Administered 2018-11-24: 1000 mg via ORAL
  Filled 2018-11-24: qty 2

## 2018-11-24 MED ORDER — ONDANSETRON HCL 4 MG/2ML IJ SOLN
4.0000 mg | Freq: Once | INTRAMUSCULAR | Status: AC
  Administered 2018-11-24: 4 mg via INTRAVENOUS
  Filled 2018-11-24: qty 2

## 2018-11-24 MED ORDER — DOXYCYCLINE HYCLATE 100 MG PO TABS: 100.0000 mg | ORAL_TABLET | Freq: Two times a day (BID) | ORAL | 0 refills | Status: AC

## 2018-11-24 MED ORDER — IOPAMIDOL (ISOVUE-300) INJECTION 61%
100.0000 mL | Freq: Once | INTRAVENOUS | Status: AC | PRN
  Administered 2018-11-24: 100 mL via INTRAVENOUS

## 2018-11-24 MED ORDER — SODIUM CHLORIDE 0.9 % IV SOLN
1.0000 g | Freq: Once | INTRAVENOUS | Status: AC
  Administered 2018-11-24: 1 g via INTRAVENOUS
  Filled 2018-11-24: qty 10

## 2018-11-24 MED ORDER — HYDROCODONE-ACETAMINOPHEN 5-325 MG PO TABS
1.0000 | ORAL_TABLET | Freq: Once | ORAL | Status: AC
  Administered 2018-11-24: 1 via ORAL
  Filled 2018-11-24: qty 1

## 2018-11-24 NOTE — Discharge Instructions (Signed)
Please take both of your antibiotics as prescribed and take your pain medication as needed for severe symptoms.  Do not have sexual intercourse until you have completed all of your antibiotics.  Please return to the emergency department for any new or worsening symptoms such as worsening pain, fevers, chills, if you cannot eat or drink, or for any other issues whatsoever.  1 of your antibiotics can make you extremely nauseated if you drink alcohol with it so please refrain from all alcohol for the next 2 weeks.  It was a pleasure to take care of you today, and thank you for coming to our emergency department.  If you have any questions or concerns before leaving please ask the nurse to grab me and I'm more than happy to go through your aftercare instructions again.  If you were prescribed any opioid pain medication today such as Norco, Vicodin, Percocet, morphine, hydrocodone, or oxycodone please make sure you do not drive when you are taking this medication as it can alter your ability to drive safely.  If you have any concerns once you are home that you are not improving or are in fact getting worse before you can make it to your follow-up appointment, please do not hesitate to call 911 and come back for further evaluation.  Merrily Brittle, MD  Results for orders placed or performed during the hospital encounter of 11/23/18  Wet prep, genital  Result Value Ref Range   Yeast Wet Prep HPF POC NONE SEEN NONE SEEN   Trich, Wet Prep NONE SEEN NONE SEEN   Clue Cells Wet Prep HPF POC PRESENT (A) NONE SEEN   WBC, Wet Prep HPF POC RARE (A) NONE SEEN   Sperm NONE SEEN   Lipase, blood  Result Value Ref Range   Lipase 47 11 - 51 U/L  Comprehensive metabolic panel  Result Value Ref Range   Sodium 137 135 - 145 mmol/L   Potassium 4.0 3.5 - 5.1 mmol/L   Chloride 107 98 - 111 mmol/L   CO2 23 22 - 32 mmol/L   Glucose, Bld 101 (H) 70 - 99 mg/dL   BUN 14 6 - 20 mg/dL   Creatinine, Ser 5.36 0.44 - 1.00  mg/dL   Calcium 8.8 (L) 8.9 - 10.3 mg/dL   Total Protein 8.0 6.5 - 8.1 g/dL   Albumin 3.5 3.5 - 5.0 g/dL   AST 26 15 - 41 U/L   ALT 25 0 - 44 U/L   Alkaline Phosphatase 50 38 - 126 U/L   Total Bilirubin 0.4 0.3 - 1.2 mg/dL   GFR calc non Af Amer >60 >60 mL/min   GFR calc Af Amer >60 >60 mL/min   Anion gap 7 5 - 15  CBC  Result Value Ref Range   WBC 5.4 4.0 - 10.5 K/uL   RBC 4.80 3.87 - 5.11 MIL/uL   Hemoglobin 12.8 12.0 - 15.0 g/dL   HCT 64.4 03.4 - 74.2 %   MCV 84.6 80.0 - 100.0 fL   MCH 26.7 26.0 - 34.0 pg   MCHC 31.5 30.0 - 36.0 g/dL   RDW 59.5 (H) 63.8 - 75.6 %   Platelets 209 150 - 400 K/uL   nRBC 0.0 0.0 - 0.2 %  Urinalysis, Complete w Microscopic  Result Value Ref Range   Color, Urine STRAW (A) YELLOW   APPearance CLEAR (A) CLEAR   Specific Gravity, Urine 1.012 1.005 - 1.030   pH 7.0 5.0 - 8.0   Glucose,  UA NEGATIVE NEGATIVE mg/dL   Hgb urine dipstick NEGATIVE NEGATIVE   Bilirubin Urine NEGATIVE NEGATIVE   Ketones, ur NEGATIVE NEGATIVE mg/dL   Protein, ur NEGATIVE NEGATIVE mg/dL   Nitrite NEGATIVE NEGATIVE   Leukocytes, UA NEGATIVE NEGATIVE   RBC / HPF 0-5 0 - 5 RBC/hpf   WBC, UA 0-5 0 - 5 WBC/hpf   Bacteria, UA NONE SEEN NONE SEEN   Squamous Epithelial / LPF 0-5 0 - 5  Pregnancy, urine POC  Result Value Ref Range   Preg Test, Ur NEGATIVE NEGATIVE   Ct Abdomen Pelvis W Contrast  Result Date: 11/24/2018 CLINICAL DATA:  Peritonitis or perforation suspected. 24 hours of worsening post prandial pain. Now difuse peritonitis EXAM: CT ABDOMEN AND PELVIS WITH CONTRAST TECHNIQUE: Multidetector CT imaging of the abdomen and pelvis was performed using the standard protocol following bolus administration of intravenous contrast. CONTRAST:  100mL ISOVUE-300 IOPAMIDOL (ISOVUE-300) INJECTION 61% COMPARISON:  CT 11/09/20186 FINDINGS: Lower chest: Scattered hypoventilatory atelectasis. Hepatobiliary: Mild hepatomegaly with liver spanning 20 cm cranial caudal. No focal hepatic  lesion. Gallbladder physiologically distended, no calcified stone. No biliary dilatation. Pancreas: No ductal dilatation or inflammation. Spleen: Normal in size without focal abnormality. Adrenals/Urinary Tract: Normal adrenal glands. No hydronephrosis or perinephric edema. Homogeneous renal enhancement with symmetric excretion on delayed phase imaging. Previous punctate stone in the lower left kidney on prior exam is not visualized currently. Urinary bladder is physiologically distended without wall thickening. Stomach/Bowel: Small hiatal hernia. Stomach is nondistended. No bowel wall thickening, inflammatory change, or obstruction. Post appendectomy with surgical clips at the base of the cecum. Moderate stool burden. No colonic wall thickening or inflammatory change. Vascular/Lymphatic: No acute vascular findings. Circumaortic left renal vein. Mild right iliac atherosclerosis. Multiple prominent bilateral external iliac and inguinal nodes likely secondary to HIV. Reproductive: Status post hysterectomy. No adnexal masses. Ovaries are tentatively identified and unremarkable. Other: Probable prior umbilical hernia repair with mesh. No ascites or free air. No intra-abdominal abscess. Musculoskeletal: Hemi transitional lumbosacral anatomy. Degenerative change in the hips. There are no acute or suspicious osseous abnormalities. IMPRESSION: 1. No acute abnormality or explanation for abdominal pain. 2. Mild hepatomegaly. 3. Small hiatal hernia. Electronically Signed   By: Narda RutherfordMelanie  Sanford M.D.   On: 11/24/2018 01:35

## 2018-11-24 NOTE — ED Notes (Signed)
Peripheral IV discontinued. Catheter intact. No signs of infiltration or redness. Gauze applied to IV site.   Discharge instructions reviewed with patient. Questions fielded by this RN. Patient verbalizes understanding of instructions. Patient discharged home in stable condition per rifenbark. No acute distress noted at time of discharge.    

## 2019-09-29 ENCOUNTER — Emergency Department
Admission: EM | Admit: 2019-09-29 | Discharge: 2019-09-29 | Disposition: A | Discharge status: Home / Self Care | Payer: Medicaid Other | Attending: Emergency Medicine | Admitting: Emergency Medicine

## 2019-09-29 ENCOUNTER — Other Ambulatory Visit: Payer: Self-pay

## 2019-09-29 DIAGNOSIS — Z5321 Procedure and treatment not carried out due to patient leaving prior to being seen by health care provider: Secondary | ICD-10-CM | POA: Insufficient documentation

## 2019-09-29 DIAGNOSIS — R109 Unspecified abdominal pain: Secondary | ICD-10-CM | POA: Diagnosis present

## 2019-09-29 LAB — COMPREHENSIVE METABOLIC PANEL
ALT: 52 U/L — ABNORMAL HIGH (ref 0–44)
AST: 58 U/L — ABNORMAL HIGH (ref 15–41)
Albumin: 3.9 g/dL (ref 3.5–5.0)
Alkaline Phosphatase: 58 U/L (ref 38–126)
Anion gap: 7 (ref 5–15)
BUN: 9 mg/dL (ref 6–20)
CO2: 25 mmol/L (ref 22–32)
Calcium: 9.2 mg/dL (ref 8.9–10.3)
Chloride: 108 mmol/L (ref 98–111)
Creatinine, Ser: 0.68 mg/dL (ref 0.44–1.00)
GFR calc Af Amer: >60 mL/min (ref >60)
GFR calc non Af Amer: >60 mL/min (ref >60)
Glucose, Bld: 90 mg/dL (ref 70–99)
Potassium: 4 mmol/L (ref 3.5–5.1)
Sodium: 140 mmol/L (ref 135–145)
Total Bilirubin: 0.4 mg/dL (ref 0.3–1.2)
Total Protein: 9 g/dL — ABNORMAL HIGH (ref 6.5–8.1)

## 2019-09-29 LAB — URINALYSIS, COMPLETE (UACMP) WITH MICROSCOPIC
Bacteria, UA: NONE SEEN
Bilirubin Urine: NEGATIVE
Glucose, UA: NEGATIVE mg/dL
Ketones, ur: NEGATIVE mg/dL
Leukocytes,Ua: NEGATIVE
Nitrite: NEGATIVE
Protein, ur: NEGATIVE mg/dL
Specific Gravity, Urine: 1.002 — ABNORMAL LOW (ref 1.005–1.030)
Squamous Epithelial / LPF: NONE SEEN (ref 0–5)
WBC, UA: NONE SEEN WBC/hpf (ref 0–5)
pH: 6 (ref 5.0–8.0)

## 2019-09-29 LAB — CBC
HCT: 38.8 % (ref 36.0–46.0)
Hemoglobin: 12.3 g/dL (ref 12.0–15.0)
MCH: 27.2 pg (ref 26.0–34.0)
MCHC: 31.7 g/dL (ref 30.0–36.0)
MCV: 85.7 fL (ref 80.0–100.0)
Platelets: 240 10*3/uL (ref 150–400)
RBC: 4.53 MIL/uL (ref 3.87–5.11)
RDW: 15.7 % — ABNORMAL HIGH (ref 11.5–15.5)
WBC: 4.6 10*3/uL (ref 4.0–10.5)
nRBC: 0 % (ref 0.0–0.2)

## 2019-09-29 LAB — LIPASE, BLOOD: Lipase: 29 U/L (ref 11–51)

## 2019-09-29 LAB — PREGNANCY, URINE: Preg Test, Ur: NEGATIVE

## 2019-09-29 MED ORDER — SODIUM CHLORIDE 0.9% FLUSH: 3.0000 mL | Freq: Once | INTRAVENOUS | Status: DC

## 2019-09-29 MED ORDER — ACETAMINOPHEN 325 MG PO TABS
650.0000 mg | ORAL_TABLET | Freq: Once | ORAL | Status: AC
  Administered 2019-09-29: 04:00:00 650 mg via ORAL
  Filled 2019-09-29: qty 2

## 2019-09-29 NOTE — ED Notes (Signed)
After medicating pt, she promptly walked outside and got into a patrol car with Copywriter, advertising; she had already pre-arranged to call him when she was ready to leave; out to speak with her, officer was unaware pt had not even been seen; pt says she can't wait any longer to be seen;

## 2019-09-29 NOTE — ED Triage Notes (Signed)
Patient reports abdominal pain for a week.  Also reports a "knot" at her vagina.

## 2019-09-29 NOTE — ED Notes (Signed)
Pt up ambulating in the waiting room; going outside to see her visitor; then walks back inside and sits in the wheelchair; ambulatory with steady gait

## 2019-09-29 NOTE — ED Notes (Signed)
EMS pt from home and brought out to waiting room via wheelchair; pt had called earlier for abd pain but did not want to be transported because her husband couldn't ride; pt called 911 again and they let husband ride up front; pt c/o sharp pain to lower abd; pregnant; receiving prenatal carea; 124/76; HR 75; G5 P4

## 2019-09-29 NOTE — ED Notes (Signed)
Pt to stat registration asking for Tylenol; pt says she's pregnant and gets her prenatal care at "social services on the first floor"; pt's urine preg here tonight is negative;

## 2020-05-09 ENCOUNTER — Inpatient Hospital Stay
Admission: EM | Admit: 2020-05-09 | Discharge: 2020-05-13 | DRG: 690 | Disposition: A | Discharge status: Home / Self Care | Payer: Medicaid Other | Attending: Internal Medicine | Admitting: Internal Medicine

## 2020-05-09 ENCOUNTER — Emergency Department: Payer: Medicaid Other

## 2020-05-09 ENCOUNTER — Other Ambulatory Visit: Payer: Self-pay

## 2020-05-09 DIAGNOSIS — Z885 Allergy status to narcotic agent status: Secondary | ICD-10-CM

## 2020-05-09 DIAGNOSIS — Z91013 Allergy to seafood: Secondary | ICD-10-CM

## 2020-05-09 DIAGNOSIS — Z21 Asymptomatic human immunodeficiency virus [HIV] infection status: Secondary | ICD-10-CM

## 2020-05-09 DIAGNOSIS — R7881 Bacteremia: Secondary | ICD-10-CM | POA: Diagnosis present

## 2020-05-09 DIAGNOSIS — Z79899 Other long term (current) drug therapy: Secondary | ICD-10-CM

## 2020-05-09 DIAGNOSIS — Z87891 Personal history of nicotine dependence: Secondary | ICD-10-CM

## 2020-05-09 DIAGNOSIS — J45909 Unspecified asthma, uncomplicated: Secondary | ICD-10-CM | POA: Diagnosis present

## 2020-05-09 DIAGNOSIS — A419 Sepsis, unspecified organism: Secondary | ICD-10-CM

## 2020-05-09 DIAGNOSIS — F1020 Alcohol dependence, uncomplicated: Secondary | ICD-10-CM | POA: Diagnosis not present

## 2020-05-09 DIAGNOSIS — Z20822 Contact with and (suspected) exposure to covid-19: Secondary | ICD-10-CM | POA: Diagnosis present

## 2020-05-09 DIAGNOSIS — N10 Acute tubulo-interstitial nephritis: Principal | ICD-10-CM | POA: Diagnosis present

## 2020-05-09 DIAGNOSIS — F329 Major depressive disorder, single episode, unspecified: Secondary | ICD-10-CM | POA: Diagnosis present

## 2020-05-09 DIAGNOSIS — B20 Human immunodeficiency virus [HIV] disease: Secondary | ICD-10-CM | POA: Diagnosis present

## 2020-05-09 DIAGNOSIS — I4891 Unspecified atrial fibrillation: Secondary | ICD-10-CM

## 2020-05-09 DIAGNOSIS — N12 Tubulo-interstitial nephritis, not specified as acute or chronic: Secondary | ICD-10-CM | POA: Diagnosis present

## 2020-05-09 LAB — COMPREHENSIVE METABOLIC PANEL
ALT: 24 U/L (ref 0–44)
AST: 34 U/L (ref 15–41)
Albumin: 3.6 g/dL (ref 3.5–5.0)
Alkaline Phosphatase: 76 U/L (ref 38–126)
Anion gap: 11 (ref 5–15)
BUN: 9 mg/dL (ref 6–20)
CO2: 22 mmol/L (ref 22–32)
Calcium: 9.1 mg/dL (ref 8.9–10.3)
Chloride: 100 mmol/L (ref 98–111)
Creatinine, Ser: 0.9 mg/dL (ref 0.44–1.00)
GFR calc Af Amer: >60 mL/min (ref >60)
GFR calc non Af Amer: >60 mL/min (ref >60)
Glucose, Bld: 150 mg/dL — ABNORMAL HIGH (ref 70–99)
Potassium: 3.6 mmol/L (ref 3.5–5.1)
Sodium: 133 mmol/L — ABNORMAL LOW (ref 135–145)
Total Bilirubin: 0.7 mg/dL (ref 0.3–1.2)
Total Protein: 9.6 g/dL — ABNORMAL HIGH (ref 6.5–8.1)

## 2020-05-09 LAB — MAGNESIUM: Magnesium: 1.9 mg/dL (ref 1.7–2.4)

## 2020-05-09 LAB — URINALYSIS, COMPLETE (UACMP) WITH MICROSCOPIC
Bilirubin Urine: NEGATIVE
Glucose, UA: 50 mg/dL — AB
Ketones, ur: 20 mg/dL — AB
Nitrite: POSITIVE — AB
Protein, ur: 30 mg/dL — AB
Specific Gravity, Urine: 1.014 (ref 1.005–1.030)
WBC, UA: >50 WBC/hpf — ABNORMAL HIGH (ref 0–5)
pH: 5 (ref 5.0–8.0)

## 2020-05-09 LAB — LIPASE, BLOOD: Lipase: 27 U/L (ref 11–51)

## 2020-05-09 LAB — LACTIC ACID, PLASMA
Lactic Acid, Venous: 2 mmol/L (ref 0.5–1.9)
Lactic Acid, Venous: 1.5 mmol/L (ref 0.5–1.9)

## 2020-05-09 LAB — CBC
HCT: 39.3 % (ref 36.0–46.0)
Hemoglobin: 13.2 g/dL (ref 12.0–15.0)
MCH: 27 pg (ref 26.0–34.0)
MCHC: 33.6 g/dL (ref 30.0–36.0)
MCV: 80.5 fL (ref 80.0–100.0)
Platelets: 236 10*3/uL (ref 150–400)
RBC: 4.88 MIL/uL (ref 3.87–5.11)
RDW: 15 % (ref 11.5–15.5)
WBC: 11.3 10*3/uL — ABNORMAL HIGH (ref 4.0–10.5)
nRBC: 0 % (ref 0.0–0.2)

## 2020-05-09 LAB — SARS CORONAVIRUS 2 BY RT PCR (HOSPITAL ORDER, PERFORMED IN ~~LOC~~ HOSPITAL LAB): SARS Coronavirus 2: NEGATIVE

## 2020-05-09 LAB — PHOSPHORUS: Phosphorus: 2.9 mg/dL (ref 2.5–4.6)

## 2020-05-09 MED ORDER — LACTATED RINGERS IV BOLUS
1000.0000 mL | Freq: Once | INTRAVENOUS | Status: AC
  Administered 2020-05-09: 1000 mL via INTRAVENOUS

## 2020-05-09 MED ORDER — IOHEXOL 300 MG/ML  SOLN
100.0000 mL | Freq: Once | INTRAMUSCULAR | Status: AC | PRN
  Administered 2020-05-09: 100 mL via INTRAVENOUS

## 2020-05-09 MED ORDER — ONDANSETRON HCL 4 MG PO TABS: 4.0000 mg | ORAL_TABLET | Freq: Four times a day (QID) | ORAL | Status: DC | PRN

## 2020-05-09 MED ORDER — THIAMINE HCL 100 MG/ML IJ SOLN: 100.0000 mg | Freq: Every day | INTRAMUSCULAR | Status: DC

## 2020-05-09 MED ORDER — ACETAMINOPHEN 650 MG RE SUPP: 650.0000 mg | Freq: Four times a day (QID) | RECTAL | Status: DC | PRN

## 2020-05-09 MED ORDER — ENOXAPARIN SODIUM 40 MG/0.4ML ~~LOC~~ SOLN: 40.0000 mg | SUBCUTANEOUS | Status: DC

## 2020-05-09 MED ORDER — ACETAMINOPHEN 325 MG PO TABS
650.0000 mg | ORAL_TABLET | Freq: Four times a day (QID) | ORAL | Status: DC | PRN
  Administered 2020-05-09 – 2020-05-12 (×4): 650 mg via ORAL
  Filled 2020-05-09 (×4): qty 2

## 2020-05-09 MED ORDER — LORAZEPAM 2 MG/ML IJ SOLN
1.0000 mg | INTRAMUSCULAR | Status: AC | PRN
  Administered 2020-05-09: 2 mg via INTRAVENOUS
  Filled 2020-05-09: qty 1

## 2020-05-09 MED ORDER — SODIUM CHLORIDE 0.9 % IV SOLN
1.0000 g | INTRAVENOUS | Status: DC
  Administered 2020-05-09: 1 g via INTRAVENOUS
  Filled 2020-05-09 (×2): qty 10

## 2020-05-09 MED ORDER — THIAMINE HCL 100 MG PO TABS
100.0000 mg | ORAL_TABLET | Freq: Every day | ORAL | Status: DC
  Administered 2020-05-10 – 2020-05-12 (×3): 100 mg via ORAL
  Filled 2020-05-09 (×3): qty 1

## 2020-05-09 MED ORDER — SENNOSIDES-DOCUSATE SODIUM 8.6-50 MG PO TABS: 1.0000 | ORAL_TABLET | Freq: Every evening | ORAL | Status: DC | PRN

## 2020-05-09 MED ORDER — ADULT MULTIVITAMIN W/MINERALS CH
1.0000 | ORAL_TABLET | Freq: Every day | ORAL | Status: DC
  Administered 2020-05-10 – 2020-05-12 (×4): 1 via ORAL
  Filled 2020-05-09 (×4): qty 1

## 2020-05-09 MED ORDER — LACTATED RINGERS IV SOLN: INTRAVENOUS | Status: DC

## 2020-05-09 MED ORDER — ENOXAPARIN SODIUM 40 MG/0.4ML ~~LOC~~ SOLN
40.0000 mg | SUBCUTANEOUS | Status: DC
  Administered 2020-05-09 – 2020-05-11 (×3): 40 mg via SUBCUTANEOUS
  Filled 2020-05-09 (×4): qty 0.4

## 2020-05-09 MED ORDER — ONDANSETRON HCL 4 MG/2ML IJ SOLN
4.0000 mg | Freq: Four times a day (QID) | INTRAMUSCULAR | Status: DC | PRN
  Administered 2020-05-10: 4 mg via INTRAVENOUS
  Filled 2020-05-09: qty 2

## 2020-05-09 MED ORDER — FOLIC ACID 1 MG PO TABS
1.0000 mg | ORAL_TABLET | Freq: Every day | ORAL | Status: DC
  Administered 2020-05-10 – 2020-05-12 (×3): 1 mg via ORAL
  Filled 2020-05-09 (×4): qty 1

## 2020-05-09 MED ORDER — LORAZEPAM 1 MG PO TABS
1.0000 mg | ORAL_TABLET | ORAL | Status: AC | PRN
  Administered 2020-05-10: 1 mg via ORAL
  Administered 2020-05-11: 2 mg via ORAL
  Filled 2020-05-09: qty 1
  Filled 2020-05-09: qty 2

## 2020-05-09 MED ORDER — LACTATED RINGERS IV BOLUS
500.0000 mL | Freq: Once | INTRAVENOUS | Status: AC
  Administered 2020-05-09: 500 mL via INTRAVENOUS

## 2020-05-09 MED ORDER — KETOROLAC TROMETHAMINE 15 MG/ML IJ SOLN
15.0000 mg | Freq: Three times a day (TID) | INTRAMUSCULAR | Status: AC | PRN
  Administered 2020-05-09 – 2020-05-10 (×3): 15 mg via INTRAVENOUS
  Filled 2020-05-09 (×5): qty 1

## 2020-05-09 MED ORDER — HYDROMORPHONE HCL 1 MG/ML IJ SOLN
0.5000 mg | Freq: Once | INTRAMUSCULAR | Status: AC
  Administered 2020-05-09: 0.5 mg via INTRAVENOUS
  Filled 2020-05-09: qty 1

## 2020-05-09 NOTE — H&P (Signed)
History and Physical:    [name]   PJA:[id number] DOB: 01-Nov-1976 DOA: 05/09/2020  Referring MD/provider: Dr. Larinda Jarvis PCP: Patient, No Pcp Per   Patient coming from: Home  Chief Complaint: Back pain, fevers, abdominal pain x2 weeks  History of Present Illness:   Meghan Jarvis is an 44 y.o. female with PMH significant for HIV last CD4 count 342, ongoing alcohol use with no history of complicated withdrawal who was in her usual state of health until 2 weeks ago when she developed bilateral lower back pain. She subsequently developed subjective fevers with rare intermittent chills. Over the past couple of days pain has now radiated to her abdomen with some associated nausea and minimal vomiting. She has been able to keep p.o.'s down. Notes that she has felt generally unwell and so came to the ED for evaluation. Patient denies loss of appetite, change in smell or taste, shortness of breath or DOE, chest pain. No blood in vomitus.  Patient denies any history of AIDS defining illnesses. Notes that she has restarted back on her HIV meds for the past 3 months.  ED Course:  The patient was noted to have a fever of 101.5, she had leukocytosis and was noted to have a markedly abnormal UA along with CVA tenderness. CT abdomen did not show any abnormality in her intestines, no stones or perinephric stranding. She is now admitted for pyelonephritis. She was treated with ceftriaxone.  ROS:   ROS   Review of Systems: As per HPI  Past Medical History:   Past Medical History:  Diagnosis Date   Asthma    Depression    HIV (human immunodeficiency virus infection) (HCC)     Past Surgical History:   Past Surgical History:  Procedure Laterality Date   ABDOMINAL HYSTERECTOMY     APPENDECTOMY      Social History:   Social History   Socioeconomic History   Marital status: Divorced    Spouse name: Not on file   Number of children: Not on file   Years of education: Not on  file   Highest education level: Not on file  Occupational History   Not on file  Tobacco Use   Smoking status: Current Every Day Smoker    Packs/day: 1.00    Types: Cigarettes   Smokeless tobacco: Never Used  Substance and Sexual Activity   Alcohol use: Yes   Drug use: No   Sexual activity: Yes    Birth control/protection: Surgical  Other Topics Concern   Not on file  Social History Narrative   Not on file   Social Determinants of Health   Financial Resource Strain:    Difficulty of Paying Living Expenses:   Food Insecurity:    Worried About Programme researcher, broadcasting/film/video in the Last Year:    Barista in the Last Year:   Transportation Needs:    Freight forwarder (Medical):    Lack of Transportation (Non-Medical):   Physical Activity:    Days of Exercise per Week:    Minutes of Exercise per Session:   Stress:    Feeling of Stress :   Social Connections:    Frequency of Communication with Friends and Family:    Frequency of Social Gatherings with Friends and Family:    Attends Religious Services:    Active Member of Clubs or Organizations:    Attends Banker Meetings:    Marital Status:   Intimate Partner Violence:  Fear of Current or Ex-Partner:    Emotionally Abused:    Physically Abused:    Sexually Abused:     Allergies   Shellfish allergy, Buprenorphine hcl, and Morphine and related  Family history:   Family History  Family history unknown: Yes    Current Medications:   Prior to Admission medications   Medication Sig Start Date End Date Taking? Authorizing Provider  albuterol (PROVENTIL HFA;VENTOLIN HFA) 108 (90 Base) MCG/ACT inhaler Inhale 1-2 puffs into the lungs every 6 (six) hours as needed for wheezing or shortness of breath. 12/13/16   Hagler, Jami L, PA-C  atazanavir-cobicistat (EVOTAZ) 300-150 MG tablet Take 1 tablet by mouth daily. Reported on 03/16/2016: HIV infection 05/18/16   Lindell Spar I, NP    emtricitabine-tenofovir AF (DESCOVY) 200-25 MG tablet Take 1 tablet by mouth daily. For HIV infection 05/18/16   Lindell Spar I, NP  ondansetron (ZOFRAN) 4 MG tablet Take 1 tablet (4 mg total) by mouth every 8 (eight) hours as needed for nausea or vomiting. 09/29/17   Lisa Roca, MD  polycarbophil (FIBERCON) 625 MG tablet Take 1 tablet (625 mg total) by mouth daily. For constipation 05/18/16   Lindell Spar I, NP  sucralfate (CARAFATE) 1 g tablet Take 1 tablet (1 g total) by mouth 4 (four) times daily. 11/28/17   Nance Pear, MD  traZODone (DESYREL) 100 MG tablet Take 2 tablets (200 mg total) by mouth at bedtime. For sleep 05/18/16   Lindell Spar I, NP  Triamcinolone Acetonide (TRIAMCINOLONE 0.1 % CREAM : EUCERIN) CREA Apply 1 application topically 3 (three) times daily. 06/04/16   Meghan Moll, PA-C    Physical Exam:   Vitals:   05/09/20 1300 05/09/20 1330 05/09/20 1400 05/09/20 1430  BP: 124/85 (!) 142/91 (!) 144/92 (!) 155/93  Pulse: 92 91 91 87  Resp: (!) 25 16 (!) 24 (!) 21  Temp:      TempSrc:      SpO2: 96% 95% 93% 95%  Weight:      Height:         Physical Exam: Blood pressure (!) 155/93, pulse 87, temperature (!) 101.5 F (38.6 C), temperature source Oral, resp. rate (!) 21, height 5\' 8"  (1.727 m), weight 90.7 kg, SpO2 95 %. Gen: Chronically ill-appearing female looking much older than stated age lying in bed chatting on the phone. Eyes: sclera anicteric, conjuctiva mildly injected bilaterally CVS: S1-S2, regulary, no gallops Respiratory:  decreased air entry likely secondary to decreased inspiratory effort GI: Patient's abdomen is soft, she does have normoactive bowel sounds, she seems to have marked tenderness however this is not reproducible on distraction. She may be has some focal rebound tenderness in her left upper quadrant. Back: Patient seems to have CVA tenderness however again this is much less with distraction. The tenderness that she does have seems to  be more paraspinal muscle rather than flank tenderness. LE: No edema. No cyanosis Neuro: A/O x 3, Moving all extremities equally with normal strength, CN 3-12 intact, grossly nonfocal.  Psych: patient is logical and coherent, judgement and insight appear normal, mood and affect are anxious. Skin: Patient has maculopapular rash on her face possible rosacea   Data Review:    Labs: Basic Metabolic Panel: Recent Labs  Lab 05/09/20 1222  NA 133*  K 3.6  CL 100  CO2 22  GLUCOSE 150*  BUN 9  CREATININE 0.90  CALCIUM 9.1   Liver Function Tests: Recent Labs  Lab 05/09/20 1222  AST  34  ALT 24  ALKPHOS 76  BILITOT 0.7  PROT 9.6*  ALBUMIN 3.6   Recent Labs  Lab 05/09/20 1222  LIPASE 27   No results for input(s): AMMONIA in the last 168 hours. CBC: Recent Labs  Lab 05/09/20 1222  WBC 11.3*  HGB 13.2  HCT 39.3  MCV 80.5  PLT 236   Cardiac Enzymes: No results for input(s): CKTOTAL, CKMB, CKMBINDEX, TROPONINI in the last 168 hours.  BNP (last 3 results) No results for input(s): PROBNP in the last 8760 hours. CBG: No results for input(s): GLUCAP in the last 168 hours.  Urinalysis    Component Value Date/Time   COLORURINE YELLOW (A) 05/09/2020 1250   APPEARANCEUR CLOUDY (A) 05/09/2020 1250   APPEARANCEUR Clear 08/18/2014 1354   LABSPEC 1.014 05/09/2020 1250   LABSPEC 1.020 08/18/2014 1354   PHURINE 5.0 05/09/2020 1250   GLUCOSEU 50 (A) 05/09/2020 1250   GLUCOSEU 50 mg/dL 12/75/1700 1749   HGBUR SMALL (A) 05/09/2020 1250   BILIRUBINUR NEGATIVE 05/09/2020 1250   BILIRUBINUR Negative 08/18/2014 1354   KETONESUR 20 (A) 05/09/2020 1250   PROTEINUR 30 (A) 05/09/2020 1250   NITRITE POSITIVE (A) 05/09/2020 1250   LEUKOCYTESUR LARGE (A) 05/09/2020 1250   LEUKOCYTESUR Negative 08/18/2014 1354      Radiographic Studies: DG Chest 2 View  Result Date: 05/09/2020 CLINICAL DATA:  Lower back and flank pain bilaterally for couple of weeks. EXAM: CHEST - 2 VIEW  COMPARISON:  October 19, 2017 FINDINGS: The cardiomediastinal silhouette is normal. No pneumothorax. Mildly coarsened lung markings. No focal infiltrates. IMPRESSION: Mildly coarsened lung markings may be due to the patient's smoking history. No focal infiltrates or acute abnormalities are identified. Electronically Signed   By: Gerome Sam III M.D   On: 05/09/2020 12:34   CT abdomen pelvis: Results still pending but wet read is as noted above.  EKG: Independently reviewed. Sinus rhythm at 90. Left axis deviation and -20. Diffusely flattened T waves. No acute ST-T wave changes.   Assessment/Plan:   Principal Problem:   Pyelonephritis Active Problems:   Alcohol use disorder, severe, dependence (HCC)  Pyelonephritis Patient has systemic symptoms with reported abdominal pain and back pain and nausea consistent with pyelonephritis. However exam is not entirely reproducible with distraction, I suspect some embellishment. CT wet read is without any stones or perinephric stranding Nonetheless her symptoms are consistent with pyelonephritis Agree with admission for observation. Aggressive hydration with LR  Ceftriaxone 1g every 24  Alcohol use disorder Patient denies history of complicated withdrawal States that she went for more than 7 days without drinking 2 months ago without seizures or shakes Nonetheless we will place on as needed Ativan per CIWA protocol but will not start patient on any standing [name] or other benzodiazepine taper.  HIV Continue HIV meds once her medications have been reconciled       Other information:   DVT prophylaxis: Lovenox ordered. Code Status: Full Family Communication: Patient's fianc/husband Ronnie was at bedside Disposition Plan: Home Consults called: None Admission status: Observation  Michaelina Blandino Tublu Rolla Servidio [facility]  If 7PM-7AM, please contact night-coverage [email or web address] Password [email or web address] 05/09/2020, 3:15 PM

## 2020-05-09 NOTE — ED Notes (Signed)
ED Provider at bedside. 

## 2020-05-09 NOTE — ED Notes (Signed)
Date and time results received: 05/09/20 12:57 PM   Test: Lactic acid  Critical Value: 2.0  Name of Provider Notified: Larinda Buttery MD

## 2020-05-09 NOTE — Progress Notes (Signed)
CODE SEPSIS - PHARMACY COMMUNICATION  **Broad Spectrum Antibiotics should be administered within 1 hour of Sepsis diagnosis**  Time Code Sepsis Called/Page Received: 1325  Antibiotics Ordered: ceftriaxone   Time of 1st antibiotic administration: 1339  Additional action taken by pharmacy: n/a  If necessary, Name of Provider/Nurse Contacted: n/a    Marty Heck ,PharmD Clinical Pharmacist  05/09/2020  1:51 PM

## 2020-05-09 NOTE — ED Provider Notes (Signed)
[facility] Emergency Department Provider Note   ____________________________________________   First MD Initiated Contact with Patient 05/09/20 1241     (approximate)  I have reviewed the triage vital signs and the nursing notes.   HISTORY  Chief Complaint Abdominal Pain    HPI Meghan Jarvis is a 44 y.o. female with past medical history of HIV (last CD4 342), asthma, and alcohol abuse presents to the ED complaining of abdominal pain.  Patient reports that she has been having increasing pain throughout much of her abdomen and both flanks for about the past week.  This is been associated with subjective fevers and chills and she also endorses nausea with vomiting.  She denies any dysuria or hematuria and is status post hysterectomy.  She was off of her HIV medications for almost a year up until February, when she states she started taking them again.        Past Medical History:  Diagnosis Date  . Asthma   . Depression   . HIV (human immunodeficiency virus infection) [facility])     Patient Active Problem List   Diagnosis Date Noted  . Alcohol use disorder, severe, dependence (HCC) 05/18/2016  . Alcohol abuse 05/17/2016    Past Surgical History:  Procedure Laterality Date  . ABDOMINAL HYSTERECTOMY    . APPENDECTOMY      Prior to Admission medications   Medication Sig Start Date End Date Taking? Authorizing Provider  albuterol (PROVENTIL HFA;VENTOLIN HFA) 108 (90 Base) MCG/ACT inhaler Inhale 1-2 puffs into the lungs every 6 (six) hours as needed for wheezing or shortness of breath. 12/13/16   Hagler, Jami L, PA-C  amoxicillin-clavulanate (AUGMENTIN) 875-125 MG tablet Take 1 tablet by mouth 2 (two) times daily. Patient not taking: Reported on 11/28/2017 12/13/16   Hagler, Jami L, PA-C  atazanavir-cobicistat (EVOTAZ) 300-150 MG tablet Take 1 tablet by mouth daily. Reported on 03/16/2016: HIV infection 05/18/16   Armandina Stammer I, NP  clobetasol ointment  (TEMOVATE) 0.05 % Apply 1 application topically 2 (two) times daily. For eye irritation 05/18/16   Armandina Stammer I, NP  dicyclomine (BENTYL) 20 MG tablet Take 1 tablet (20 mg total) by mouth 3 (three) times daily as needed (abdominal pain). 11/28/17   Phineas Semen, MD  DULoxetine (CYMBALTA) 60 MG capsule Take 1 capsule (60 mg total) by mouth daily. For depression 05/18/16   Armandina Stammer I, NP  emtricitabine-tenofovir AF (DESCOVY) 200-25 MG tablet Take 1 tablet by mouth daily. For HIV infection 05/18/16   Armandina Stammer I, NP  gabapentin (NEURONTIN) 300 MG capsule Take 1 capsule (300 mg total) by mouth 3 (three) times daily. For agitation/alcohol withdrawal syndrome 05/18/16   Armandina Stammer I, NP  HYDROcodone-acetaminophen (NORCO) 5-325 MG tablet Take 1 tablet by mouth every 6 (six) hours as needed for up to 7 doses for severe pain. 11/24/18   Merrily Brittle, MD  meloxicam (MOBIC) 7.5 MG tablet Take 2 tablets (15 mg total) by mouth daily as needed for pain. 05/18/16   Armandina Stammer I, NP  ondansetron (ZOFRAN ODT) 8 MG disintegrating tablet Take 1 tablet (8 mg total) by mouth every 8 (eight) hours as needed for nausea or vomiting. 05/18/16   Armandina Stammer I, NP  ondansetron (ZOFRAN) 4 MG tablet Take 1 tablet (4 mg total) by mouth every 8 (eight) hours as needed for nausea or vomiting. 09/29/17   Governor Rooks, MD  polycarbophil (FIBERCON) 625 MG tablet Take 1 tablet (625 mg total) by mouth  daily. For constipation 05/18/16   Armandina Stammer I, NP  sucralfate (CARAFATE) 1 g tablet Take 1 tablet (1 g total) by mouth 4 (four) times daily. 11/28/17   Phineas Semen, MD  traZODone (DESYREL) 100 MG tablet Take 2 tablets (200 mg total) by mouth at bedtime. For sleep 05/18/16   Armandina Stammer I, NP  Triamcinolone Acetonide (TRIAMCINOLONE 0.1 % CREAM : EUCERIN) CREA Apply 1 application topically 3 (three) times daily. 06/04/16   Cuthriell, Delorise Royals, PA-C    Allergies Shellfish allergy, Buprenorphine hcl, and Morphine and  related  Family History  Family history unknown: Yes    Social History Social History   Tobacco Use  . Smoking status: Current Every Day Smoker    Packs/day: 1.00    Types: Cigarettes  . Smokeless tobacco: Never Used  Substance Use Topics  . Alcohol use: Yes  . Drug use: No    Review of Systems  Constitutional: Positive for fever/chills Eyes: No visual changes. ENT: No sore throat. Cardiovascular: Denies chest pain. Respiratory: Denies shortness of breath. Gastrointestinal: Positive for abdominal pain, nausea, and vomiting.  No diarrhea.  No constipation. Genitourinary: Negative for dysuria. Musculoskeletal: Negative for back pain. Skin: Negative for rash. Neurological: Negative for headaches, focal weakness or numbness.  ____________________________________________   PHYSICAL EXAM:  VITAL SIGNS: ED Triage Vitals  Enc Vitals Group     BP 05/09/20 1208 112/75     Pulse Rate 05/09/20 1208 (!) 101     Resp 05/09/20 1208 (!) 22     Temp 05/09/20 1208 (!) 101.5 F (38.6 C)     Temp Source 05/09/20 1208 Oral     SpO2 05/09/20 1208 96 %     Weight 05/09/20 1206 200 lb (90.7 kg)     Height 05/09/20 1206 5\' 8"  (1.727 m)     Head Circumference --      Peak Flow --      Pain Score 05/09/20 1205 10     Pain Loc --      Pain Edu? --      Excl. in GC? --     Constitutional: Alert and oriented. Eyes: Conjunctivae are normal. Head: Atraumatic. Nose: No congestion/rhinnorhea. Mouth/Throat: Mucous membranes are moist. Neck: Normal ROM Cardiovascular: Tachycardic, regular rhythm. Grossly normal heart sounds. Respiratory: Normal respiratory effort.  No retractions. Lungs CTAB. Gastrointestinal: Soft and diffusely tender to palpation with voluntary guarding.  CVA tenderness noted bilaterally. No distention. Genitourinary: deferred Musculoskeletal: No lower extremity tenderness nor edema. Neurologic:  Normal speech and language. No gross focal neurologic deficits are  appreciated. Skin:  Skin is warm, dry and intact. No rash noted. Psychiatric: Mood and affect are normal. Speech and behavior are normal.  ____________________________________________   LABS (all labs ordered are listed, but only abnormal results are displayed)  Labs Reviewed  COMPREHENSIVE METABOLIC PANEL - Abnormal; Notable for the following components:      Result Value   Sodium 133 (*)    Glucose, Bld 150 (*)    Total Protein 9.6 (*)    All other components within normal limits  CBC - Abnormal; Notable for the following components:   WBC 11.3 (*)    All other components within normal limits  URINALYSIS, COMPLETE (UACMP) WITH MICROSCOPIC - Abnormal; Notable for the following components:   Color, Urine YELLOW (*)    APPearance CLOUDY (*)    Glucose, UA 50 (*)    Hgb urine dipstick SMALL (*)    Ketones, ur 20 (*)  Protein, ur 30 (*)    Nitrite POSITIVE (*)    Leukocytes,Ua LARGE (*)    WBC, UA >50 (*)    Bacteria, UA MANY (*)    All other components within normal limits  LACTIC ACID, PLASMA - Abnormal; Notable for the following components:   Lactic Acid, Venous 2.0 (*)    All other components within normal limits  SARS CORONAVIRUS 2 BY RT PCR (HOSPITAL ORDER, [facility])  CULTURE, BLOOD (ROUTINE X 2)  CULTURE, BLOOD (ROUTINE X 2)  URINE CULTURE  LIPASE, BLOOD  LACTIC ACID, PLASMA   ____________________________________________  EKG  ED ECG REPORT I, Blake Divine, the attending physician, personally viewed and interpreted this ECG.   Date: 05/09/2020  EKG Time: 12:10  Rate: 94  Rhythm: normal sinus rhythm  Axis: Normal  Intervals:none  ST&T Change: Nonspecific T wave changes   PROCEDURES  Procedure(s) performed (including Critical Care):  .Critical Care Performed by: Blake Divine, MD Authorized by: Blake Divine, MD   Critical care provider statement:    Critical care time (minutes):  45   Critical care time was  exclusive of:  Separately billable procedures and treating other patients and teaching time   Critical care was necessary to treat or prevent imminent or life-threatening deterioration of the following conditions:  Sepsis   Critical care was time spent personally by me on the following activities:  Discussions with consultants, evaluation of patient's response to treatment, examination of patient, ordering and performing treatments and interventions, ordering and review of laboratory studies, ordering and review of radiographic studies, pulse oximetry, re-evaluation of patient's condition, obtaining history from patient or surrogate and review of old charts   I assumed direction of critical care for this patient from another provider in my specialty: no   .1-3 Lead EKG Interpretation Performed by: Blake Divine, MD Authorized by: Blake Divine, MD     Interpretation: normal     ECG rate:  72   ECG rate assessment: normal     Rhythm: sinus rhythm     Ectopy: none     Conduction: normal       ____________________________________________   INITIAL IMPRESSION / ASSESSMENT AND PLAN / ED COURSE       44 year old female with history of HIV, last CD4 count of 342 in February, presents to the ED with fever and bilateral flank pain as well as nausea and vomiting.  She has bilateral CVA tenderness as well as diffuse abdominal tenderness, vital signs also concerning for sepsis.  We will start sepsis work-up with IV fluid hydration, blood cultures, lactate.  Pyelonephritis seems to be the most likely etiology of her sepsis, UA is consistent with UTI.  CT scan was obtained and shows no evidence of nephrolithiasis or other acute process.  We will start patient on Rocephin in case was discussed with hospitalist for admission.      ____________________________________________   FINAL CLINICAL IMPRESSION(S) / ED DIAGNOSES  Final diagnoses:  Pyelonephritis  Sepsis without acute organ  dysfunction, due to unspecified organism [facility])     ED Discharge Orders    None       Note:  This document was prepared using Dragon voice recognition software and may include unintentional dictation errors.   Blake Divine, MD 05/09/20 [phone]

## 2020-05-09 NOTE — Progress Notes (Addendum)
Patient arrived from ED, 118/69, heart rate, 02 96 on room air, LR running at 150 mls fever of 102.9, alert and oriented, [place] protocol intciated, patient in pain, medication given, patient on CIWA

## 2020-05-09 NOTE — ED Triage Notes (Addendum)
Pt comes EMS with lower back/flank pain bilaterally for a couple of a weeks. Pt started vomiting today. Temp with EMS 101.2. Pt also HIV positive.

## 2020-05-10 DIAGNOSIS — R7881 Bacteremia: Secondary | ICD-10-CM | POA: Diagnosis present

## 2020-05-10 DIAGNOSIS — Z20822 Contact with and (suspected) exposure to covid-19: Secondary | ICD-10-CM | POA: Diagnosis present

## 2020-05-10 DIAGNOSIS — N12 Tubulo-interstitial nephritis, not specified as acute or chronic: Secondary | ICD-10-CM | POA: Diagnosis not present

## 2020-05-10 DIAGNOSIS — F1020 Alcohol dependence, uncomplicated: Secondary | ICD-10-CM | POA: Diagnosis present

## 2020-05-10 DIAGNOSIS — R1084 Generalized abdominal pain: Secondary | ICD-10-CM | POA: Diagnosis present

## 2020-05-10 DIAGNOSIS — F329 Major depressive disorder, single episode, unspecified: Secondary | ICD-10-CM | POA: Diagnosis present

## 2020-05-10 DIAGNOSIS — N10 Acute tubulo-interstitial nephritis: Secondary | ICD-10-CM | POA: Diagnosis not present

## 2020-05-10 DIAGNOSIS — Z885 Allergy status to narcotic agent status: Secondary | ICD-10-CM | POA: Diagnosis not present

## 2020-05-10 DIAGNOSIS — Z79899 Other long term (current) drug therapy: Secondary | ICD-10-CM | POA: Diagnosis not present

## 2020-05-10 DIAGNOSIS — J45909 Unspecified asthma, uncomplicated: Secondary | ICD-10-CM | POA: Diagnosis present

## 2020-05-10 DIAGNOSIS — Z87891 Personal history of nicotine dependence: Secondary | ICD-10-CM | POA: Diagnosis not present

## 2020-05-10 DIAGNOSIS — B20 Human immunodeficiency virus [HIV] disease: Secondary | ICD-10-CM | POA: Diagnosis present

## 2020-05-10 DIAGNOSIS — I4891 Unspecified atrial fibrillation: Secondary | ICD-10-CM | POA: Diagnosis present

## 2020-05-10 DIAGNOSIS — Z91013 Allergy to seafood: Secondary | ICD-10-CM | POA: Diagnosis not present

## 2020-05-10 DIAGNOSIS — I340 Nonrheumatic mitral (valve) insufficiency: Secondary | ICD-10-CM | POA: Diagnosis not present

## 2020-05-10 LAB — BLOOD CULTURE ID PANEL (REFLEXED)

## 2020-05-10 LAB — BASIC METABOLIC PANEL
Anion gap: 9 (ref 5–15)
BUN: 10 mg/dL (ref 6–20)
CO2: 25 mmol/L (ref 22–32)
Calcium: 8.5 mg/dL — ABNORMAL LOW (ref 8.9–10.3)
Chloride: 100 mmol/L (ref 98–111)
Creatinine, Ser: 0.62 mg/dL (ref 0.44–1.00)
GFR calc Af Amer: >60 mL/min (ref >60)
GFR calc non Af Amer: >60 mL/min (ref >60)
Glucose, Bld: 76 mg/dL (ref 70–99)
Potassium: 4 mmol/L (ref 3.5–5.1)
Sodium: 134 mmol/L — ABNORMAL LOW (ref 135–145)

## 2020-05-10 LAB — CBC
HCT: 39.9 % (ref 36.0–46.0)
Hemoglobin: 13.3 g/dL (ref 12.0–15.0)
MCH: 26.8 pg (ref 26.0–34.0)
MCHC: 33.3 g/dL (ref 30.0–36.0)
MCV: 80.4 fL (ref 80.0–100.0)
Platelets: 226 10*3/uL (ref 150–400)
RBC: 4.96 MIL/uL (ref 3.87–5.11)
RDW: 15.1 % (ref 11.5–15.5)
WBC: 7.1 10*3/uL (ref 4.0–10.5)
nRBC: 0 % (ref 0.0–0.2)

## 2020-05-10 MED ORDER — BICTEGRAVIR-EMTRICITAB-TENOFOV 50-200-25 MG PO TABS
1.0000 | ORAL_TABLET | Freq: Every day | ORAL | Status: DC
  Administered 2020-05-10 – 2020-05-12 (×3): 1 via ORAL
  Filled 2020-05-10 (×5): qty 1

## 2020-05-10 MED ORDER — SODIUM CHLORIDE 0.9 % IV SOLN: INTRAVENOUS | Status: DC

## 2020-05-10 MED ORDER — CALCIUM POLYCARBOPHIL 625 MG PO TABS
625.0000 mg | ORAL_TABLET | Freq: Every day | ORAL | Status: DC
  Administered 2020-05-10 – 2020-05-12 (×3): 625 mg via ORAL
  Filled 2020-05-10 (×5): qty 1

## 2020-05-10 MED ORDER — PANTOPRAZOLE SODIUM 40 MG PO TBEC
40.0000 mg | DELAYED_RELEASE_TABLET | Freq: Every day | ORAL | Status: DC
  Administered 2020-05-11 – 2020-05-12 (×2): 40 mg via ORAL
  Filled 2020-05-10 (×2): qty 1

## 2020-05-10 MED ORDER — GABAPENTIN 300 MG PO CAPS
300.0000 mg | ORAL_CAPSULE | Freq: Three times a day (TID) | ORAL | Status: DC
  Administered 2020-05-10 – 2020-05-12 (×8): 300 mg via ORAL
  Filled 2020-05-10 (×8): qty 1

## 2020-05-10 MED ORDER — SODIUM CHLORIDE 0.9 % IV SOLN
1.0000 g | Freq: Three times a day (TID) | INTRAVENOUS | Status: DC
  Administered 2020-05-10 – 2020-05-11 (×4): 1 g via INTRAVENOUS
  Filled 2020-05-10 (×6): qty 1

## 2020-05-10 MED ORDER — DULOXETINE HCL 30 MG PO CPEP
90.0000 mg | ORAL_CAPSULE | Freq: Every day | ORAL | Status: DC
  Administered 2020-05-10 – 2020-05-12 (×3): 90 mg via ORAL
  Filled 2020-05-10 (×3): qty 3

## 2020-05-10 NOTE — Progress Notes (Signed)
Pharmacy Antibiotic Note  Meghan Jarvis is a 44 y.o. female admitted on 05/09/2020 with bacteremia.  Pharmacy has been consulted for Meropenem dosing.  Plan: Meropenem 1gm IV q8hrs  Height: 5\' 8"  (172.7 cm) Weight: 90.7 kg (200 lb) IBW/kg (Calculated) : 63.9  Temp (24hrs), Avg:101 F (38.3 C), Min:98.6 F (37 C), Max:103.2 F (39.6 C)  Recent Labs  Lab 05/09/20 1222 05/09/20 1449  WBC 11.3*  --   CREATININE 0.90  --   LATICACIDVEN 2.0* 1.5    Estimated Creatinine Clearance: 93 mL/min (by C-G formula based on SCr of 0.9 mg/dL).    Allergies  Allergen Reactions  . Shellfish Allergy Other (See Comments)    Crab legs result in itching  . Buprenorphine Hcl     Pt states she not allergic  . Morphine And Related Itching    hives    Antimicrobials this admission:   >>    >>   Dose adjustments this admission:   Microbiology results:  BCx:   UCx:    Sputum:    MRSA PCR:   Thank you for allowing pharmacy to be a part of this patient's care.  05/11/20 A 05/10/2020 3:06 AM

## 2020-05-10 NOTE — Progress Notes (Signed)
PHARMACY - PHYSICIAN COMMUNICATION CRITICAL VALUE ALERT - BLOOD CULTURE IDENTIFICATION (BCID)  Meghan Jarvis is an 44 y.o. female who presented to [facility] on 05/09/2020 with a chief complaint of abd pain, fevers, chills  Assessment:  Lab reports 1 of 4 bottles + for E coli, KPC not detected  Name of physician (or Provider) Contacted: Reyes Ivan NP  Current antibiotics: Rocephin  Changes to prescribed antibiotics recommended: change to Meropenem per protocol Recommendations accepted by provider  Results for orders placed or performed during the hospital encounter of 05/09/20  Blood Culture ID Panel (Reflexed) (Collected: 05/09/2020 12:22 PM)  Result Value Ref Range   Enterococcus species NOT DETECTED NOT DETECTED   Listeria monocytogenes NOT DETECTED NOT DETECTED   Staphylococcus species NOT DETECTED NOT DETECTED   Staphylococcus aureus (BCID) NOT DETECTED NOT DETECTED   Streptococcus species NOT DETECTED NOT DETECTED   Streptococcus agalactiae NOT DETECTED NOT DETECTED   Streptococcus pneumoniae NOT DETECTED NOT DETECTED   Streptococcus pyogenes NOT DETECTED NOT DETECTED   Acinetobacter baumannii NOT DETECTED NOT DETECTED   Enterobacteriaceae species DETECTED (A) NOT DETECTED   Enterobacter cloacae complex NOT DETECTED NOT DETECTED   Escherichia coli DETECTED (A) NOT DETECTED   Klebsiella oxytoca NOT DETECTED NOT DETECTED   Klebsiella pneumoniae NOT DETECTED NOT DETECTED   Proteus species NOT DETECTED NOT DETECTED   Serratia marcescens NOT DETECTED NOT DETECTED   Carbapenem resistance NOT DETECTED NOT DETECTED   Haemophilus influenzae NOT DETECTED NOT DETECTED   Neisseria meningitidis NOT DETECTED NOT DETECTED   Pseudomonas aeruginosa NOT DETECTED NOT DETECTED   Candida albicans NOT DETECTED NOT DETECTED   Candida glabrata NOT DETECTED NOT DETECTED   Candida krusei NOT DETECTED NOT DETECTED   Candida parapsilosis NOT DETECTED NOT DETECTED   Candida tropicalis NOT DETECTED  NOT DETECTED    Valrie Hart A 05/10/2020  2:42 AM

## 2020-05-10 NOTE — Progress Notes (Signed)
PROGRESS NOTE    Meghan Jarvis  FGH:[id number]  DOB: 08-11-1976  DOA: 05/09/2020 PCP: Patient, No Pcp Per Outpatient Specialists:   Hospital course:  Patient is a 44 year old female with PMH significant for HIV last CD4 count 342, ongoing alcohol use with no history of complicated withdrawal was admitted 05/09/2020 with pyelonephritis.  She had developed bilateral lower back pain, fevers chills and abdominal pain with associated nausea and vomiting.  In the ED she was normotensive with normal lactate but did have CVA tenderness.  Her urine was grossly abnormal.  CT abdomen did not show any intestinal abnormalities, no renal stones, no perinephric stranding.  She was started on ceftriaxone however overnight 1 out of 4 blood cultures have become positive with E. Coli, sensitivities pending, so she was transitioned to meropenem per protocol.  Subjective:  Patient states she feels a little bit better although is still feeling pretty tired.  Notes abdominal pain is improving and nausea and vomiting have improved as well.  However she still has generalized malaise and some abdominal pain.  Denies feeling shaky or that she is withdrawing from alcohol.   Objective: Vitals:   05/09/20 2300 05/10/20 0231 05/10/20 0605 05/10/20 1201  BP: (!) 98/56 (!) 99/54 112/72 (!) 144/91  Pulse:    83  Resp:   20 20  Temp: 99 F (37.2 C) 98.6 F (37 C) 98.6 F (37 C) 98 F (36.7 C)  TempSrc: Oral Oral Oral Oral  SpO2:  99%  98%  Weight:      Height:        Intake/Output Summary (Last 24 hours) at 05/10/2020 1237 Last data filed at 05/10/2020 1100 Gross per 24 hour  Intake 2159.58 ml  Output 1350 ml  Net 809.58 ml   Filed Weights   05/09/20 1206  Weight: 90.7 kg     Exam:  General: Tired appearing female lying flat in bed in no acute distress. Eyes: sclera anicteric, conjuctiva mild injection bilaterally CVS: S1-S2, regular  Respiratory:  decreased air entry bilaterally  secondary to decreased inspiratory effort, rales at bases  GI: Patient does have normoactive bowel sounds, abdomen is soft, patient has some voluntary guarding with minimal tenderness to light and deep palpation.  No rebound tenderness.  She does have bilateral CVA tenderness. LE: No edema.  Neuro: A/O x 3, Moving all extremities equally with normal strength, CN 3-12 intact, grossly nonfocal.  Psych: patient is logical and coherent, judgement and insight appear normal, mood and affect appropriate to situation.   Assessment & Plan:   Pyelonephritis with bacteremia 1/4 bottles positive for E. Coli. On meropenem day #1 today. Continue fluid resuscitation with lactated Ringer's at 125cc/hr  Alcohol use disorder Patient denies history of complicated withdrawal States that she went for more than 7 days without drinking 2 months ago without seizures or shakes Nonetheless we will place on as needed Ativan per CIWA protocol but will not start patient on any standing [name] or other benzodiazepine taper.  HIV Continue Biktarvy per home meds  Depression Continue duloxetine    DVT prophylaxis: Enoxaparin Code Status: Full Family Communication: Patient states no need to contact anyone Disposition Plan:   Patient is from: Home  Anticipated Discharge Location: Home  Barriers to Discharge: Treatment of acute pyelonephritis ongoing  Is patient medically stable for Discharge: No   Consultants:  None  Procedures:  None  Antimicrobials:  Meropenem started 05/10/2020   Data Reviewed:  Basic Metabolic Panel: Recent Labs  Lab  05/09/20 1222 05/10/20 0524  NA 133* 134*  K 3.6 4.0  CL 100 100  CO2 22 25  GLUCOSE 150* 76  BUN 9 10  CREATININE 0.90 0.62  CALCIUM 9.1 8.5*  MG 1.9  --   PHOS 2.9  --    Liver Function Tests: Recent Labs  Lab 05/09/20 1222  AST 34  ALT 24  ALKPHOS 76  BILITOT 0.7  PROT 9.6*  ALBUMIN 3.6   Recent Labs  Lab 05/09/20 1222  LIPASE 27    No results for input(s): AMMONIA in the last 168 hours. CBC: Recent Labs  Lab 05/09/20 1222 05/10/20 0524  WBC 11.3* 7.1  HGB 13.2 13.3  HCT 39.3 39.9  MCV 80.5 80.4  PLT 236 226   Cardiac Enzymes: No results for input(s): CKTOTAL, CKMB, CKMBINDEX, TROPONINI in the last 168 hours. BNP (last 3 results) No results for input(s): PROBNP in the last 8760 hours. CBG: No results for input(s): GLUCAP in the last 168 hours.  Recent Results (from the past 240 hour(s))  Blood culture (routine x 2)     Status: None (Preliminary result)   Collection Time: 05/09/20 12:22 PM   Specimen: BLOOD  Result Value Ref Range Status   Specimen Description BLOOD RIGHT FOREARM  Final   Special Requests   Final    BOTTLES DRAWN AEROBIC AND ANAEROBIC Blood Culture adequate volume   Culture  Setup Time   Final    Organism ID to follow ANAEROBIC BOTTLE ONLY GRAM NEGATIVE RODS CRITICAL RESULT CALLED TO, READ BACK BY AND VERIFIED WITH: SCOTT HALL ON 05/10/20 AT 0145 [facility] Performed at [facility], [address] [place], [place] [address]    Culture GRAM NEGATIVE RODS  Final   Report Status PENDING  Incomplete  Blood Culture ID Panel (Reflexed)     Status: Abnormal   Collection Time: 05/09/20 12:22 PM  Result Value Ref Range Status   Enterococcus species NOT DETECTED NOT DETECTED Final   Listeria monocytogenes NOT DETECTED NOT DETECTED Final   Staphylococcus species NOT DETECTED NOT DETECTED Final   Staphylococcus aureus (BCID) NOT DETECTED NOT DETECTED Final   Streptococcus species NOT DETECTED NOT DETECTED Final   Streptococcus agalactiae NOT DETECTED NOT DETECTED Final   Streptococcus pneumoniae NOT DETECTED NOT DETECTED Final   Streptococcus pyogenes NOT DETECTED NOT DETECTED Final   Acinetobacter baumannii NOT DETECTED NOT DETECTED Final   Enterobacteriaceae species DETECTED (A) NOT DETECTED Final    Comment: Enterobacteriaceae represent a large family of gram-negative bacteria, not  a single organism. CRITICAL RESULT CALLED TO, READ BACK BY AND VERIFIED WITH: SCOTT HALL ON 05/10/20 AT 0145 [facility]    Enterobacter cloacae complex NOT DETECTED NOT DETECTED Final   Escherichia coli DETECTED (A) NOT DETECTED Final    Comment: CRITICAL RESULT CALLED TO, READ BACK BY AND VERIFIED WITH: SCOTT HALL ON 05/10/20 AT 0145 [facility]    Klebsiella oxytoca NOT DETECTED NOT DETECTED Final   Klebsiella pneumoniae NOT DETECTED NOT DETECTED Final   Proteus species NOT DETECTED NOT DETECTED Final   Serratia marcescens NOT DETECTED NOT DETECTED Final   Carbapenem resistance NOT DETECTED NOT DETECTED Final   Haemophilus influenzae NOT DETECTED NOT DETECTED Final   Neisseria meningitidis NOT DETECTED NOT DETECTED Final   Pseudomonas aeruginosa NOT DETECTED NOT DETECTED Final   Candida albicans NOT DETECTED NOT DETECTED Final   Candida glabrata NOT DETECTED NOT DETECTED Final   Candida krusei NOT DETECTED NOT DETECTED Final   Candida  parapsilosis NOT DETECTED NOT DETECTED Final   Candida tropicalis NOT DETECTED NOT DETECTED Final    Comment: Performed at [facility], [address] [place], [place] [address]  Blood culture (routine x 2)     Status: None (Preliminary result)   Collection Time: 05/09/20 12:46 PM   Specimen: BLOOD  Result Value Ref Range Status   Specimen Description BLOOD LEFT WRIST  Final   Special Requests   Final    BOTTLES DRAWN AEROBIC AND ANAEROBIC Blood Culture adequate volume   Culture   Final    NO GROWTH < 24 HOURS Performed at [facility], [address] [place], [place] [address]    Report Status PENDING  Incomplete  SARS Coronavirus 2 by RT PCR (hospital order, performed in [place] hospital lab) Nasopharyngeal Nasopharyngeal Swab     Status: None   Collection Time: 05/09/20 12:50 PM   Specimen: Nasopharyngeal Swab  Result Value Ref Range Status   SARS Coronavirus 2 NEGATIVE NEGATIVE Final    Comment: (NOTE) SARS-CoV-2 target nucleic  acids are NOT DETECTED. The SARS-CoV-2 RNA is generally detectable in upper and lower respiratory specimens during the acute phase of infection. The lowest concentration of SARS-CoV-2 viral copies this assay can detect is 250 copies / mL. A negative result does not preclude SARS-CoV-2 infection and should not be used as the sole basis for treatment or other patient management decisions.  A negative result may occur with improper specimen collection / handling, submission of specimen other than nasopharyngeal swab, presence of viral mutation(s) within the areas targeted by this assay, and inadequate number of viral copies (<250 copies / mL). A negative result must be combined with clinical observations, patient history, and epidemiological information. Fact Sheet for Patients:   StrictlyIdeas.no Fact Sheet for Healthcare Providers: BankingDealers.co.za This test is not yet approved or cleared  by the [place] FDA and has been authorized for detection and/or diagnosis of SARS-CoV-2 by FDA under an Emergency Use Authorization (EUA).  This EUA will remain in effect (meaning this test can be used) for the duration of the COVID-19 declaration under Section 564(b)(1) of the Act, 21 U.S.C. section 360bbb-3(b)(1), unless the authorization is terminated or revoked sooner. Performed at [facility], [address] [place], [place] [address]       Studies: DG Chest 2 View  Result Date: 05/09/2020 CLINICAL DATA:  Lower back and flank pain bilaterally for couple of weeks. EXAM: CHEST - 2 VIEW COMPARISON:  October 19, 2017 FINDINGS: The cardiomediastinal silhouette is normal. No pneumothorax. Mildly coarsened lung markings. No focal infiltrates. IMPRESSION: Mildly coarsened lung markings may be due to the patient's smoking history. No focal infiltrates or acute abnormalities are identified. Electronically Signed   By: Dorise Bullion III  M.D   On: 05/09/2020 12:34   CT Abdomen Pelvis W Contrast  Result Date: 05/09/2020 CLINICAL DATA:  Left abdominal and flank pain for several weeks. Vomiting. Fever. HIV. EXAM: CT ABDOMEN AND PELVIS WITH CONTRAST TECHNIQUE: Multidetector CT imaging of the abdomen and pelvis was performed using the standard protocol following bolus administration of intravenous contrast. CONTRAST:  174mL OMNIPAQUE IOHEXOL 300 MG/ML  SOLN COMPARISON:  11/24/2018 FINDINGS: Lower Chest: No acute findings. Hepatobiliary: No hepatic masses identified. Mild hepatic steatosis. Gallbladder is unremarkable. No evidence of biliary ductal dilatation. Pancreas:  No mass or inflammatory changes. Spleen: Within normal limits in size and appearance. Adrenals/Urinary Tract: No masses identified. No evidence of ureteral calculi or hydronephrosis. Stomach/Bowel:  No evidence of obstruction, inflammatory process or abnormal fluid collections. Vascular/Lymphatic: Shotty less than 1 cm bilateral pelvic lymph nodes are seen in the external iliac and obturator chains, without significant change the compared to prior 2019 study, consistent with the benign etiology. No pathologically enlarged lymph nodes. No abdominal aortic aneurysm. Reproductive: Prior hysterectomy noted. Adnexal regions are unremarkable in appearance. Other: Surgical mesh seen in anterior abdominal wall and paraumbilical region. No evidence of recurrent hernia or mass. Musculoskeletal:  No suspicious bone lesions identified. IMPRESSION: 1. No acute findings within the abdomen or pelvis. 2. Mild hepatic steatosis. Electronically Signed   By: Danae Orleans M.D.   On: 05/09/2020 13:44     Scheduled Meds: . enoxaparin (LOVENOX) injection  40 mg Subcutaneous Q24H  . folic acid  1 mg Oral Daily  . multivitamin with minerals  1 tablet Oral Daily  . thiamine  100 mg Oral Daily   Or  . thiamine  100 mg Intravenous Daily   Continuous Infusions: . lactated ringers 150 mL/hr at 05/10/20  0841  . meropenem (MERREM) IV 1 g (05/10/20 0553)    Principal Problem:   Pyelonephritis Active Problems:   Alcohol use disorder, severe, dependence (HCC)   HIV (human immunodeficiency virus infection) (HCC)     Ninah Moccio Tublu Tysin Salada, [facility]  If 7PM-7AM, please contact night-coverage [email or web address] Password [facility] 05/10/2020, 12:37 PM    LOS: 0 days

## 2020-05-11 ENCOUNTER — Inpatient Hospital Stay (HOSPITAL_COMMUNITY)
Admit: 2020-05-11 | Discharge: 2020-05-11 | Disposition: A | Discharge status: Home / Self Care | Payer: Medicaid Other | Attending: Internal Medicine | Admitting: Internal Medicine

## 2020-05-11 DIAGNOSIS — I4891 Unspecified atrial fibrillation: Secondary | ICD-10-CM

## 2020-05-11 DIAGNOSIS — I340 Nonrheumatic mitral (valve) insufficiency: Secondary | ICD-10-CM

## 2020-05-11 LAB — URINE CULTURE: Culture: >=100000 — AB

## 2020-05-11 LAB — BASIC METABOLIC PANEL
Anion gap: 6 (ref 5–15)
BUN: 6 mg/dL (ref 6–20)
CO2: 26 mmol/L (ref 22–32)
Calcium: 7.9 mg/dL — ABNORMAL LOW (ref 8.9–10.3)
Chloride: 103 mmol/L (ref 98–111)
Creatinine, Ser: 0.65 mg/dL (ref 0.44–1.00)
GFR calc Af Amer: >60 mL/min (ref >60)
GFR calc non Af Amer: >60 mL/min (ref >60)
Glucose, Bld: 82 mg/dL (ref 70–99)
Potassium: 3.6 mmol/L (ref 3.5–5.1)
Sodium: 135 mmol/L (ref 135–145)

## 2020-05-11 LAB — CBC WITH DIFFERENTIAL/PLATELET
Abs Immature Granulocytes: 0.01 10*3/uL (ref 0.00–0.07)
Basophils Absolute: 0 10*3/uL (ref 0.0–0.1)
Basophils Relative: 1 %
Eosinophils Absolute: 0.3 10*3/uL (ref 0.0–0.5)
Eosinophils Relative: 4 %
HCT: 34.6 % — ABNORMAL LOW (ref 36.0–46.0)
Hemoglobin: 11.3 g/dL — ABNORMAL LOW (ref 12.0–15.0)
Immature Granulocytes: 0 %
Lymphocytes Relative: 35 %
Lymphs Abs: 2.1 10*3/uL (ref 0.7–4.0)
MCH: 26.5 pg (ref 26.0–34.0)
MCHC: 32.7 g/dL (ref 30.0–36.0)
MCV: 81 fL (ref 80.0–100.0)
Monocytes Absolute: 0.6 10*3/uL (ref 0.1–1.0)
Monocytes Relative: 10 %
Neutro Abs: 3 10*3/uL (ref 1.7–7.7)
Neutrophils Relative %: 50 %
Platelets: 217 10*3/uL (ref 150–400)
RBC: 4.27 MIL/uL (ref 3.87–5.11)
RDW: 14.7 % (ref 11.5–15.5)
WBC: 5.9 10*3/uL (ref 4.0–10.5)
nRBC: 0 % (ref 0.0–0.2)

## 2020-05-11 LAB — ECHOCARDIOGRAM COMPLETE
Height: 68 in
Weight: 3200 oz

## 2020-05-11 MED ORDER — CEFAZOLIN SODIUM-DEXTROSE 2-4 GM/100ML-% IV SOLN
2.0000 g | Freq: Three times a day (TID) | INTRAVENOUS | Status: DC
  Filled 2020-05-11 (×2): qty 100

## 2020-05-11 MED ORDER — SODIUM CHLORIDE 0.9 % IV SOLN
2.0000 g | INTRAVENOUS | Status: DC
  Administered 2020-05-11 – 2020-05-12 (×2): 2 g via INTRAVENOUS
  Filled 2020-05-11: qty 20
  Filled 2020-05-11: qty 2
  Filled 2020-05-11: qty 20

## 2020-05-11 NOTE — Progress Notes (Signed)
*  PRELIMINARY RESULTS* Echocardiogram 2D Echocardiogram has been performed.  Cristela Blue 05/11/2020, 2:10 PM

## 2020-05-11 NOTE — Progress Notes (Addendum)
Patient transferred from ICU at 2200, this shift, notified in report patient had went into AFIB previous night, was placed on Amiodarone and converted to NSR, had no further issues, patient was started on IV Lopressor 5mg  q6 hours, At 0200 am notified from tele. That patient heart rate was 110 to 140  switched  from NSR to AFIB, patient alert and oriented, having no chest pain, or discomfort, paged surgeon on call, who consulted hospitalitis on call, to assess patient, patient Afib 104, BP 126/72, rr 20, sp02 95 %, resting, no distress, at 0310 surgeon called back to update me, hospitalists  was consulted and will come assess patient

## 2020-05-11 NOTE — Progress Notes (Signed)
PROGRESS NOTE    Meghan Jarvis  IPJ:[id number]  DOB: Apr 28, 1976  DOA: 05/09/2020 PCP: Patient, No Pcp Per Outpatient Specialists:   Hospital course:  Patient is a 44 year old female with PMH significant for HIV last CD4 count 342, ongoing alcohol use with no history of complicated withdrawal was admitted 05/09/2020 with pyelonephritis. CT abdomen did not show any intestinal abnormalities, no renal stones, no perinephric stranding.  She was started on ceftriaxone however overnight 1 out of 4 blood cultures have become positive with E. Coli, sensitivities pending, so she was transitioned to meropenem per protocol.  On 05/10/2020 patient developed spontaneous A. fib with RVR, new onset.  Subjective:  Patient states she feels better now but "it felt like my heart was beating out of my chest" last night.  She had associated shortness of breath.  All symptoms resolved with resolution of A. Fib.  Denies ever having A. fib in the past.    Objective: Vitals:   05/10/20 1201 05/10/20 1950 05/11/20 0546 05/11/20 1410  BP: (!) 144/91 124/83 123/85 119/81  Pulse: 83 69 72 64  Resp: 20 20 16 18   Temp: 98 F (36.7 C) 98.2 F (36.8 C) 98.2 F (36.8 C) 98.4 F (36.9 C)  TempSrc: Oral Oral Oral Oral  SpO2: 98% 97% 95% 96%  Weight:      Height:        Intake/Output Summary (Last 24 hours) at 05/11/2020 1601 Last data filed at 05/11/2020 1300 Gross per 24 hour  Intake 830 ml  Output 200 ml  Net 630 ml   Filed Weights   05/09/20 1206  Weight: 90.7 kg     Exam:  General: Patient lying in bed in NAD, seems to have a little more energy than yesterday.  Eyes: sclera anicteric, conjuctiva mild injection bilaterally CVS: S1-S2, regular  Respiratory:  decreased air entry bilaterally secondary to decreased inspiratory effort, rales at bases  GI: Patient does have normoactive bowel sounds, abdomen is soft, patient has some voluntary guarding with minimal tenderness to light and deep  palpation.  No rebound tenderness.  She does have bilateral CVA tenderness. LE: No edema.  Neuro: A/O x 3, Moving all extremities equally with normal strength, CN 3-12 intact, grossly nonfocal.  Psych: patient is logical and coherent, judgement and insight appear normal, mood and affect appropriate to situation.   Assessment & Plan:   Pyelonephritis with bacteremia 1/4 bottles positive for E. Coli. On meropenem day #2 today.  New onset atrial fibrillation Patient had episode of new onset A. fib last night which resolved spontaneously Has no history of hypertension, diabetes or heart failure Echocardiogram ordered to see if she falls under lone A. Fib category Continue to watch patient on telemetry  Alcohol use disorder Patient denies history of complicated withdrawal States that she went for more than 7 days without drinking 2 months ago without seizures or shakes Nonetheless we will place on as needed Ativan per [facility]WA protocol but will not start patient on any standing [name] or other benzodiazepine taper.  HIV Continue Biktarvy per home meds  Depression Continue duloxetine    DVT prophylaxis: Enoxaparin Code Status: Full Family Communication: Patient states no need to contact anyone Disposition Plan:   Patient is from: Home  Anticipated Discharge Location: Home  Barriers to Discharge: Treatment of acute pyelonephritis ongoing, new onset A. fib  Is patient medically stable for Discharge: No   Consultants:  None  Procedures:  None  Antimicrobials:  Meropenem started 05/10/2020  Data Reviewed:  Basic Metabolic Panel: Recent Labs  Lab 05/09/20 1222 05/10/20 0524 05/11/20 0602  NA 133* 134* 135  K 3.6 4.0 3.6  CL 100 100 103  CO2 22 25 26   GLUCOSE 150* 76 82  BUN 9 10 6   CREATININE 0.90 0.62 0.65  CALCIUM 9.1 8.5* 7.9*  MG 1.9  --   --   PHOS 2.9  --   --    Liver Function Tests: Recent Labs  Lab 05/09/20 1222  AST 34  ALT 24  ALKPHOS 76    BILITOT 0.7  PROT 9.6*  ALBUMIN 3.6   Recent Labs  Lab 05/09/20 1222  LIPASE 27   No results for input(s): AMMONIA in the last 168 hours. CBC: Recent Labs  Lab 05/09/20 1222 05/10/20 0524 05/11/20 0602  WBC 11.3* 7.1 5.9  NEUTROABS  --   --  3.0  HGB 13.2 13.3 11.3*  HCT 39.3 39.9 34.6*  MCV 80.5 80.4 81.0  PLT 236 226 217   Cardiac Enzymes: No results for input(s): CKTOTAL, CKMB, CKMBINDEX, TROPONINI in the last 168 hours. BNP (last 3 results) No results for input(s): PROBNP in the last 8760 hours. CBG: No results for input(s): GLUCAP in the last 168 hours.  Recent Results (from the past 240 hour(s))  Blood culture (routine x 2)     Status: Abnormal (Preliminary result)   Collection Time: 05/09/20 12:22 PM   Specimen: BLOOD  Result Value Ref Range Status   Specimen Description   Final    BLOOD RIGHT FOREARM Performed at [facility], [address], [place], [place] [address]    Special Requests   Final    BOTTLES DRAWN AEROBIC AND ANAEROBIC Blood Culture adequate volume Performed at [facility], [address] [place], [place] [address]    Culture  Setup Time   Final    ANAEROBIC BOTTLE ONLY GRAM NEGATIVE RODS CRITICAL RESULT CALLED TO, READ BACK BY AND VERIFIED WITH: SCOTT HALL ON 05/10/20 AT 0145 [facility]    Culture (A)  Final    ESCHERICHIA COLI CULTURE REINCUBATED FOR BETTER GROWTH Performed at [facility], [place] [address] [place], [place] [address]    Report Status PENDING  Incomplete  Blood Culture ID Panel (Reflexed)     Status: Abnormal   Collection Time: 05/09/20 12:22 PM  Result Value Ref Range Status   Enterococcus species NOT DETECTED NOT DETECTED Final   Listeria monocytogenes NOT DETECTED NOT DETECTED Final   Staphylococcus species NOT DETECTED NOT DETECTED Final   Staphylococcus aureus (BCID) NOT DETECTED NOT DETECTED Final   Streptococcus species NOT DETECTED NOT DETECTED Final   Streptococcus agalactiae NOT  DETECTED NOT DETECTED Final   Streptococcus pneumoniae NOT DETECTED NOT DETECTED Final   Streptococcus pyogenes NOT DETECTED NOT DETECTED Final   Acinetobacter baumannii NOT DETECTED NOT DETECTED Final   Enterobacteriaceae species DETECTED (A) NOT DETECTED Final    Comment: Enterobacteriaceae represent a large family of gram-negative bacteria, not a single organism. CRITICAL RESULT CALLED TO, READ BACK BY AND VERIFIED WITH: SCOTT HALL ON 05/10/20 AT 0145 [place]    Enterobacter cloacae complex NOT DETECTED NOT DETECTED Final   Escherichia coli DETECTED (A) NOT DETECTED Final    Comment: CRITICAL RESULT CALLED TO, READ BACK BY AND VERIFIED WITH: SCOTT HALL ON 05/10/20 AT 0145 [place]    Klebsiella oxytoca NOT DETECTED NOT DETECTED Final   Klebsiella pneumoniae NOT DETECTED NOT DETECTED Final   Proteus species NOT  DETECTED NOT DETECTED Final   Serratia marcescens NOT DETECTED NOT DETECTED Final   Carbapenem resistance NOT DETECTED NOT DETECTED Final   Haemophilus influenzae NOT DETECTED NOT DETECTED Final   Neisseria meningitidis NOT DETECTED NOT DETECTED Final   Pseudomonas aeruginosa NOT DETECTED NOT DETECTED Final   Candida albicans NOT DETECTED NOT DETECTED Final   Candida glabrata NOT DETECTED NOT DETECTED Final   Candida krusei NOT DETECTED NOT DETECTED Final   Candida parapsilosis NOT DETECTED NOT DETECTED Final   Candida tropicalis NOT DETECTED NOT DETECTED Final    Comment: Performed at [facility], [address] [place], [place] [address]  Blood culture (routine x 2)     Status: None (Preliminary result)   Collection Time: 05/09/20 12:46 PM   Specimen: BLOOD  Result Value Ref Range Status   Specimen Description BLOOD LEFT WRIST  Final   Special Requests   Final    BOTTLES DRAWN AEROBIC AND ANAEROBIC Blood Culture adequate volume   Culture   Final    NO GROWTH 2 DAYS Performed at [facility], [address] [place], [place] [address]    Report Status  PENDING  Incomplete  SARS Coronavirus 2 by RT PCR (hospital order, performed in [facility] hospital lab) Nasopharyngeal Nasopharyngeal Swab     Status: None   Collection Time: 05/09/20 12:50 PM   Specimen: Nasopharyngeal Swab  Result Value Ref Range Status   SARS Coronavirus 2 NEGATIVE NEGATIVE Final    Comment: (NOTE) SARS-CoV-2 target nucleic acids are NOT DETECTED. The SARS-CoV-2 RNA is generally detectable in upper and lower respiratory specimens during the acute phase of infection. The lowest concentration of SARS-CoV-2 viral copies this assay can detect is 250 copies / mL. A negative result does not preclude SARS-CoV-2 infection and should not be used as the sole basis for treatment or other patient management decisions.  A negative result may occur with improper specimen collection / handling, submission of specimen other than nasopharyngeal swab, presence of viral mutation(s) within the areas targeted by this assay, and inadequate number of viral copies (<250 copies / mL). A negative result must be combined with clinical observations, patient history, and epidemiological information. Fact Sheet for Patients:   BoilerBrush.com.cy Fact Sheet for Healthcare Providers: https://pope.com/ This test is not yet approved or cleared  by the [place] FDA and has been authorized for detection and/or diagnosis of SARS-CoV-2 by FDA under an Emergency Use Authorization (EUA).  This EUA will remain in effect (meaning this test can be used) for the duration of the COVID-19 declaration under Section 564(b)(1) of the Act, 21 U.S.C. section 360bbb-3(b)(1), unless the authorization is terminated or revoked sooner. Performed at [facility], [address], [place], [place] [address]   Urine culture     Status: Abnormal   Collection Time: 05/09/20 12:50 PM   Specimen: Urine, Random  Result Value Ref Range Status   Specimen  Description   Final    URINE, RANDOM Performed at [facility], [address], [place], [place] [address]    Special Requests   Final    NONE Performed at [facility], [address] [place], [place] [address]    Culture >=100,000 COLONIES/mL ESCHERICHIA COLI (A)  Final   Report Status 05/11/2020 FINAL  Final   Organism ID, Bacteria ESCHERICHIA COLI (A)  Final      Susceptibility   Escherichia coli - MIC*    AMPICILLIN >=32 RESISTANT Resistant  CEFAZOLIN 16 SENSITIVE Sensitive     CEFTRIAXONE <=1 SENSITIVE Sensitive     CIPROFLOXACIN <=0.25 SENSITIVE Sensitive     GENTAMICIN <=1 SENSITIVE Sensitive     IMIPENEM <=0.25 SENSITIVE Sensitive     NITROFURANTOIN <=16 SENSITIVE Sensitive     TRIMETH/SULFA <=20 SENSITIVE Sensitive     AMPICILLIN/SULBACTAM >=32 RESISTANT Resistant     PIP/TAZO <=4 SENSITIVE Sensitive     * >=100,000 COLONIES/mL ESCHERICHIA COLI      Studies: No results found.   Scheduled Meds:  bictegravir-emtricitabine-tenofovir AF  1 tablet Oral Daily   DULoxetine  90 mg Oral Daily   enoxaparin (LOVENOX) injection  40 mg Subcutaneous Q24H   folic acid  1 mg Oral Daily   gabapentin  300 mg Oral TID   multivitamin with minerals  1 tablet Oral Daily   pantoprazole  40 mg Oral Daily   polycarbophil  625 mg Oral Daily   thiamine  100 mg Oral Daily   Or   thiamine  100 mg Intravenous Daily   Continuous Infusions:  cefTRIAXone (ROCEPHIN)  IV 2 g (05/11/20 1455)    Principal Problem:   Pyelonephritis Active Problems:   Alcohol use disorder, severe, dependence (HCC)   HIV (human immunodeficiency virus infection) (HCC)     Colandra Ohanian Tublu Hesham [place], [facility]  If 7PM-7AM, please contact night-coverage [email or web address] Password [email or web address] 05/11/2020, 4:01 PM    LOS: 1 day

## 2020-05-11 NOTE — Plan of Care (Signed)

## 2020-05-11 NOTE — TOC Initial Note (Signed)
Transition of Care [facility]) - Initial/Assessment Note    Patient Details  Name: Meghan Jarvis MRN: [id number] Date of Birth: December 02, 1976  [facility]) CM/SW Contact:    Candie Chroman, LCSW Phone Number: 05/11/2020, 9:36 AM  Clinical Narrative: CSW met with patient. Female friend/family member at bedside. CSW introduced role and inquired about patient not having a PCP. Patient confirmed. She said she was assigned someone through [facility] but cannot remember who they are. CSW encouraged patient to call her Medicaid worker to see if they can assist with getting set up again. If not, provided packet for free/low-cost healthcare centers in [facility] for patient to review and follow up with if needed. No further concerns. CSW encouraged patient to contact CSW as needed. CSW will continue to follow patient for support and facilitate return home when stable.                 Expected Discharge Plan: Home/Self Care Barriers to Discharge: Continued Medical Work up   Patient Goals and CMS Choice     Choice offered to / list presented to : NA  Expected Discharge Plan and Services Expected Discharge Plan: Home/Self Care       Living arrangements for the past 2 months: Single Family Home                                      Prior Living Arrangements/Services Living arrangements for the past 2 months: Single Family Home   Patient language and need for interpreter reviewed:: Yes Do you feel safe going back to the place where you live?: Yes      Need for Family Participation in Patient Care: Yes (Comment) Care giver support system in place?: Yes (comment)   Criminal Activity/Legal Involvement Pertinent to Current Situation/Hospitalization: No - Comment as needed  Activities of Daily Living Home Assistive Devices/Equipment: None ADL Screening (condition at time of admission) Patient's cognitive ability adequate to safely complete daily activities?: Yes Is the patient  deaf or have difficulty hearing?: No Does the patient have difficulty seeing, even when wearing glasses/contacts?: No Does the patient have difficulty concentrating, remembering, or making decisions?: No Patient able to express need for assistance with ADLs?: Yes Does the patient have difficulty dressing or bathing?: No Independently performs ADLs?: Yes (appropriate for developmental age) Does the patient have difficulty walking or climbing stairs?: No Weakness of Legs: None Weakness of Arms/Hands: None  Permission Sought/Granted Permission sought to share information with : Family Supports Permission granted to share information with : Yes, Verbal Permission Granted        Permission granted to share info w Relationship: Female friend/family member at bedside.     Emotional Assessment Appearance:: Appears stated age Attitude/Demeanor/Rapport: Engaged, Gracious Affect (typically observed): Accepting, Appropriate, Calm Orientation: : Oriented to Self, Oriented to Place, Oriented to  Time, Oriented to Situation Alcohol / Substance Use: Not Applicable Psych Involvement: No (comment)  Admission diagnosis:  Pyelonephritis [N12] Sepsis without acute organ dysfunction, due to unspecified organism [facility]) [A41.9] Patient Active Problem List   Diagnosis Date Noted   Pyelonephritis 05/09/2020   HIV (human immunodeficiency virus infection) ([place])    Alcohol use disorder, severe, dependence ([place]) 05/18/2016   Alcohol abuse 05/17/2016   PCP:  Patient, No Pcp Per Pharmacy:   [facility] - [address], [place]  [place] [address] Phone: [phone] Fax: [phone]  [facility] [place], [place] - [address] [place] [address] Phone: [phone] Fax: [phone]  [id number] [place]:  [phone]: [phone]    Social Determinants of Health (SDOH) Interventions    Readmission Risk Interventions No flowsheet data found.

## 2020-05-12 LAB — BASIC METABOLIC PANEL
Anion gap: 7 (ref 5–15)
BUN: <5 mg/dL — ABNORMAL LOW (ref 6–20)
CO2: 26 mmol/L (ref 22–32)
Calcium: 8.3 mg/dL — ABNORMAL LOW (ref 8.9–10.3)
Chloride: 105 mmol/L (ref 98–111)
Creatinine, Ser: 0.68 mg/dL (ref 0.44–1.00)
GFR calc Af Amer: >60 mL/min (ref >60)
GFR calc non Af Amer: >60 mL/min (ref >60)
Glucose, Bld: 98 mg/dL (ref 70–99)
Potassium: 3.7 mmol/L (ref 3.5–5.1)
Sodium: 138 mmol/L (ref 135–145)

## 2020-05-12 LAB — MAGNESIUM: Magnesium: 2 mg/dL (ref 1.7–2.4)

## 2020-05-12 LAB — CBC
HCT: 35.7 % — ABNORMAL LOW (ref 36.0–46.0)
Hemoglobin: 12 g/dL (ref 12.0–15.0)
MCH: 26.7 pg (ref 26.0–34.0)
MCHC: 33.6 g/dL (ref 30.0–36.0)
MCV: 79.3 fL — ABNORMAL LOW (ref 80.0–100.0)
Platelets: 229 10*3/uL (ref 150–400)
RBC: 4.5 MIL/uL (ref 3.87–5.11)
RDW: 14.9 % (ref 11.5–15.5)
WBC: 5.1 10*3/uL (ref 4.0–10.5)
nRBC: 0 % (ref 0.0–0.2)

## 2020-05-12 MED ORDER — POTASSIUM CHLORIDE CRYS ER 20 MEQ PO TBCR
40.0000 meq | EXTENDED_RELEASE_TABLET | Freq: Once | ORAL | Status: AC
  Administered 2020-05-12: 40 meq via ORAL
  Filled 2020-05-12: qty 2

## 2020-05-12 MED ORDER — KETOROLAC TROMETHAMINE 15 MG/ML IJ SOLN
15.0000 mg | Freq: Four times a day (QID) | INTRAMUSCULAR | Status: DC
  Administered 2020-05-12 – 2020-05-13 (×3): 15 mg via INTRAVENOUS
  Filled 2020-05-12 (×3): qty 1

## 2020-05-12 NOTE — Progress Notes (Signed)
°   05/12/20 1450  Clinical Encounter Type  Visited With Patient  Visit Type Initial  Referral From Chaplain  Consult/Referral To Chaplain  Chaplain encountered patient in the hall. Patient was taking a break from walking. Chaplain mentioned patient's smile. Patient told chaplain that she is going home tomorrow. Chaplain wished her well.

## 2020-05-12 NOTE — Progress Notes (Signed)
PROGRESS NOTE    Meghan LeysBarbara A Nutter  YNW:[id number] DOB: 1976/11/28 DOA: 05/09/2020 PCP: Patient, No Pcp Per  Brief Narrative:  Patient is a 44 year old female with PMH significant for HIV last CD4 count 342, ongoing alcohol use with no history of complicated withdrawal was admitted 05/09/2020 with pyelonephritis. CT abdomen did not show any intestinal abnormalities, no renal stones, no perinephric stranding.  She was started on ceftriaxone however overnight 1 out of 4 blood cultures have become positive with E. Coli, sensitivities pending, so she was transitioned to meropenem per protocol.  On 05/10/2020 patient developed spontaneous A. fib with RVR, new onset.  6/2: Patient seen and examined.  Visibly no distress.  Has lower abdominal pain.  Previous notes indicated transition to meropenem however per pharmacy patient has only received 1 dose and was placed back on Rocephin.   Assessment & Plan:   Principal Problem:   Pyelonephritis Active Problems:   Alcohol use disorder, severe, dependence (HCC)   HIV (human immunodeficiency virus infection) (HCC)   Atrial fibrillation, new onset (HCC)  Acute pyelonephritis E. coli bacteremia 1/4+ for E. coli from blood cultures Further sensitivities pending Discussed with pharmacy.  Patient received 1 dose of meropenem however was never been started on it regularly Currently on Rocephin and appears to be clinically responding No fevers noted over interval, no signs of clinical worsening Plan: Continue Rocephin Follow updated cultures Consider transition to p.o. antibiotics and discharge home tomorrow if blood culture results are reassuring  New onset atrial fibrillation Noted on telemetry Resolved spontaneously without intervention No history of hypertension, diabetes, heart failure Echocardiogram essentially normal No further episodes of dysrhythmia on telemetry Plan: Continue to monitor on telemetry No medication additions for  now Repeat twelve-lead EKG and consider rate control anticoagulation if dysrhythmia recurs  Alcohol use disorder No history of withdrawals Not scoring on CIWA Can continue CIWA protocol for now  HIV Continue home Biktarvy  Depression Continue home duloxetine  DVT prophylaxis: Lovenox Code Status: Full Family Communication: Family member at bedside Disposition Plan: Status is: Inpatient  Remains inpatient appropriate because:Inpatient level of care appropriate due to severity of illness   Dispo: The patient is from: Home              Anticipated d/c is to: Home              Anticipated d/c date is: 1 day              Patient currently is not medically stable to d/c.  Pending final culture and sensitivities from blood culture.  Anticipate discharge home tomorrow.   Consultants:   None  Procedures:   None  Antimicrobials:   Rocephin   Subjective: Seen and examined.  Some lower abdominal pain.  No other complaints  Objective: Vitals:   05/11/20 1943 05/12/20 0400 05/12/20 0910 05/12/20 1226  BP: 118/86 114/87 115/75 119/78  Pulse: 68 68 65 61  Resp: 20 20 16 18   Temp: 98.1 F (36.7 C) 97.9 F (36.6 C) 97.8 F (36.6 C) 98 F (36.7 C)  TempSrc: Oral Oral Oral Oral  SpO2: 97% 99% 99% 98%  Weight:      Height:        Intake/Output Summary (Last 24 hours) at 05/12/2020 1243 Last data filed at 05/12/2020 1041 Gross per 24 hour  Intake 938 ml  Output 1000 ml  Net -62 ml   Filed Weights   05/09/20 1206  Weight: 90.7 kg    Examination:  General exam: Appears calm and comfortable  Respiratory system: Clear to auscultation. Respiratory effort normal. Cardiovascular system: S1 & S2 heard, RRR. No JVD, murmurs, rubs, gallops or clicks. No pedal edema. Gastrointestinal system: Nondistended, mild tender to palpation suprapubic region.  Normal bowel sounds  Central nervous system: Alert and oriented. No focal neurological deficits. Extremities: Symmetric 5 x  5 power. Skin: No rashes, lesions or ulcers Psychiatry: Judgement and insight appear normal. Mood & affect appropriate.     Data Reviewed: I have personally reviewed following labs and imaging studies  CBC: Recent Labs  Lab 05/09/20 1222 05/10/20 0524 05/11/20 0602 05/12/20 0545  WBC 11.3* 7.1 5.9 5.1  NEUTROABS  --   --  3.0  --   HGB 13.2 13.3 11.3* 12.0  HCT 39.3 39.9 34.6* 35.7*  MCV 80.5 80.4 81.0 79.3*  PLT 236 226 217 378   Basic Metabolic Panel: Recent Labs  Lab 05/09/20 1222 05/10/20 0524 05/11/20 0602 05/12/20 0545  NA 133* 134* 135 138  K 3.6 4.0 3.6 3.7  CL 100 100 103 105  CO2 22 25 26 26   GLUCOSE 150* 76 82 98  BUN 9 10 6  <5*  CREATININE 0.90 0.62 0.65 0.68  CALCIUM 9.1 8.5* 7.9* 8.3*  MG 1.9  --   --  2.0  PHOS 2.9  --   --   --    GFR: Estimated Creatinine Clearance: 104.6 mL/min (by C-G formula based on SCr of 0.68 mg/dL). Liver Function Tests: Recent Labs  Lab 05/09/20 1222  AST 34  ALT 24  ALKPHOS 76  BILITOT 0.7  PROT 9.6*  ALBUMIN 3.6   Recent Labs  Lab 05/09/20 1222  LIPASE 27   No results for input(s): AMMONIA in the last 168 hours. Coagulation Profile: No results for input(s): INR, PROTIME in the last 168 hours. Cardiac Enzymes: No results for input(s): CKTOTAL, CKMB, CKMBINDEX, TROPONINI in the last 168 hours. BNP (last 3 results) No results for input(s): PROBNP in the last 8760 hours. HbA1C: No results for input(s): HGBA1C in the last 72 hours. CBG: No results for input(s): GLUCAP in the last 168 hours. Lipid Profile: No results for input(s): CHOL, HDL, LDLCALC, TRIG, CHOLHDL, LDLDIRECT in the last 72 hours. Thyroid Function Tests: No results for input(s): TSH, T4TOTAL, FREET4, T3FREE, THYROIDAB in the last 72 hours. Anemia Panel: No results for input(s): VITAMINB12, FOLATE, FERRITIN, TIBC, IRON, RETICCTPCT in the last 72 hours. Sepsis Labs: Recent Labs  Lab 05/09/20 1222 05/09/20 1449  LATICACIDVEN 2.0* 1.5     Recent Results (from the past 240 hour(s))  Blood culture (routine x 2)     Status: Abnormal (Preliminary result)   Collection Time: 05/09/20 12:22 PM   Specimen: BLOOD  Result Value Ref Range Status   Specimen Description   Final    BLOOD RIGHT FOREARM Performed at [facility], [address] [place], [place] [address]    Special Requests   Final    BOTTLES DRAWN AEROBIC AND ANAEROBIC Blood Culture adequate volume Performed at [facility], [address] [place], [place] [address]    Culture  Setup Time   Final    ANAEROBIC BOTTLE ONLY GRAM NEGATIVE RODS CRITICAL RESULT CALLED TO, READ BACK BY AND VERIFIED WITH: SCOTT HALL ON 05/10/20 AT 0145 [facility]    Culture (A)  Final    ESCHERICHIA COLI SUSCEPTIBILITIES TO FOLLOW Performed at [facility], [place] [address], [place], [place] [address]    Report  Status PENDING  Incomplete  Blood Culture ID Panel (Reflexed)     Status: Abnormal   Collection Time: 05/09/20 12:22 PM  Result Value Ref Range Status   Enterococcus species NOT DETECTED NOT DETECTED Final   Listeria monocytogenes NOT DETECTED NOT DETECTED Final   Staphylococcus species NOT DETECTED NOT DETECTED Final   Staphylococcus aureus (BCID) NOT DETECTED NOT DETECTED Final   Streptococcus species NOT DETECTED NOT DETECTED Final   Streptococcus agalactiae NOT DETECTED NOT DETECTED Final   Streptococcus pneumoniae NOT DETECTED NOT DETECTED Final   Streptococcus pyogenes NOT DETECTED NOT DETECTED Final   Acinetobacter baumannii NOT DETECTED NOT DETECTED Final   Enterobacteriaceae species DETECTED (A) NOT DETECTED Final    Comment: Enterobacteriaceae represent a large family of gram-negative bacteria, not a single organism. CRITICAL RESULT CALLED TO, READ BACK BY AND VERIFIED WITH: SCOTT HALL ON 05/10/20 AT 0145 [facility]    Enterobacter cloacae complex NOT DETECTED NOT DETECTED Final   Escherichia coli DETECTED (A) NOT DETECTED Final    Comment:  CRITICAL RESULT CALLED TO, READ BACK BY AND VERIFIED WITH: SCOTT HALL ON 05/10/20 AT 0145 [facility]    Klebsiella oxytoca NOT DETECTED NOT DETECTED Final   Klebsiella pneumoniae NOT DETECTED NOT DETECTED Final   Proteus species NOT DETECTED NOT DETECTED Final   Serratia marcescens NOT DETECTED NOT DETECTED Final   Carbapenem resistance NOT DETECTED NOT DETECTED Final   Haemophilus influenzae NOT DETECTED NOT DETECTED Final   Neisseria meningitidis NOT DETECTED NOT DETECTED Final   Pseudomonas aeruginosa NOT DETECTED NOT DETECTED Final   Candida albicans NOT DETECTED NOT DETECTED Final   Candida glabrata NOT DETECTED NOT DETECTED Final   Candida krusei NOT DETECTED NOT DETECTED Final   Candida parapsilosis NOT DETECTED NOT DETECTED Final   Candida tropicalis NOT DETECTED NOT DETECTED Final    Comment: Performed at [facility], [address] [place], [place] [address]  Blood culture (routine x 2)     Status: None (Preliminary result)   Collection Time: 05/09/20 12:46 PM   Specimen: BLOOD  Result Value Ref Range Status   Specimen Description BLOOD LEFT WRIST  Final   Special Requests   Final    BOTTLES DRAWN AEROBIC AND ANAEROBIC Blood Culture adequate volume   Culture   Final    NO GROWTH 3 DAYS Performed at [facility], [address] [place], [place] [address]    Report Status PENDING  Incomplete  SARS Coronavirus 2 by RT PCR (hospital order, performed in [facility] hospital lab) Nasopharyngeal Nasopharyngeal Swab     Status: None   Collection Time: 05/09/20 12:50 PM   Specimen: Nasopharyngeal Swab  Result Value Ref Range Status   SARS Coronavirus 2 NEGATIVE NEGATIVE Final    Comment: (NOTE) SARS-CoV-2 target nucleic acids are NOT DETECTED. The SARS-CoV-2 RNA is generally detectable in upper and lower respiratory specimens during the acute phase of infection. The lowest concentration of SARS-CoV-2 viral copies this assay can detect is 250 copies / mL. A  negative result does not preclude SARS-CoV-2 infection and should not be used as the sole basis for treatment or other patient management decisions.  A negative result may occur with improper specimen collection / handling, submission of specimen other than nasopharyngeal swab, presence of viral mutation(s) within the areas targeted by this assay, and inadequate number of viral copies (<250 copies / mL). A negative result must be combined with clinical observations, patient history, and epidemiological information. Fact Sheet for Patients:  BoilerBrush.com.cy Fact Sheet for Healthcare Providers: https://pope.com/ This test is not yet approved or cleared  by the [place] FDA and has been authorized for detection and/or diagnosis of SARS-CoV-2 by FDA under an Emergency Use Authorization (EUA).  This EUA will remain in effect (meaning this test can be used) for the duration of the COVID-19 declaration under Section 564(b)(1) of the Act, 21 U.S.C. section 360bbb-3(b)(1), unless the authorization is terminated or revoked sooner. Performed at [facility], [address] [place], [place] [address]   Urine culture     Status: Abnormal   Collection Time: 05/09/20 12:50 PM   Specimen: Urine, Random  Result Value Ref Range Status   Specimen Description   Final    URINE, RANDOM Performed at [facility], [address] [place], [place] [address]    Special Requests   Final    NONE Performed at [facility], [address] [place], [place] [address]    Culture >=100,000 COLONIES/mL ESCHERICHIA COLI (A)  Final   Report Status 05/11/2020 FINAL  Final   Organism ID, Bacteria ESCHERICHIA COLI (A)  Final      Susceptibility   Escherichia coli - MIC*    AMPICILLIN >=32 RESISTANT Resistant     CEFAZOLIN 16 SENSITIVE Sensitive     CEFTRIAXONE <=1 SENSITIVE Sensitive     CIPROFLOXACIN <=0.25 SENSITIVE Sensitive      GENTAMICIN <=1 SENSITIVE Sensitive     IMIPENEM <=0.25 SENSITIVE Sensitive     NITROFURANTOIN <=16 SENSITIVE Sensitive     TRIMETH/SULFA <=20 SENSITIVE Sensitive     AMPICILLIN/SULBACTAM >=32 RESISTANT Resistant     PIP/TAZO <=4 SENSITIVE Sensitive     * >=100,000 COLONIES/mL ESCHERICHIA COLI         Radiology Studies: ECHOCARDIOGRAM COMPLETE  Result Date: 05/11/2020    ECHOCARDIOGRAM REPORT   Patient Name:   Meghan Jarvis Date of Exam: 05/11/2020 Medical Rec #:  [id number]       Height:       68.0 in Accession #:    [id number]      Weight:       200.0 lb Date of Birth:  1976-02-19       BSA:          2.044 m Patient Age:    43 years        BP:           123/85 mmHg Patient Gender: F               HR:           72 bpm. Exam Location:  [facility] Procedure: 2D Echo, Cardiac Doppler and Color Doppler Indications:     Atrial Fibrillation 427.31  History:         Patient has no prior history of Echocardiogram examinations. No                  cardiac history listed in chart.  Sonographer:     Cristela Blue RDCS (AE) Referring Phys:  [id number] Raynaldo Opitz CHATTERJEE Diagnosing Phys: Yvonne Kendall MD  Sonographer Comments: Suboptimal apical window. IMPRESSIONS  1. Left ventricular ejection fraction, by estimation, is 55 to 60%. The left ventricle has normal function. The left ventricle has no regional wall motion abnormalities. There is mild left ventricular hypertrophy. Left ventricular diastolic parameters were normal.  2. Right ventricular systolic function is normal. The right ventricular size is normal. There is normal pulmonary artery systolic pressure.  3. Right atrial size was mildly dilated.  4. The mitral valve is normal in structure. Mild mitral valve regurgitation.  5. The aortic valve is tricuspid. Aortic valve regurgitation is not visualized. No aortic stenosis is present. FINDINGS  Left Ventricle: Left ventricular ejection fraction, by estimation, is 55 to 60%. The left ventricle has normal  function. The left ventricle has no regional wall motion abnormalities. The left ventricular internal cavity size was normal in size. There is  mild left ventricular hypertrophy. Left ventricular diastolic parameters were normal. Right Ventricle: The right ventricular size is normal. No increase in right ventricular wall thickness. Right ventricular systolic function is normal. There is normal pulmonary artery systolic pressure. Left Atrium: Left atrial size was normal in size. Right Atrium: Right atrial size was mildly dilated. Pericardium: The pericardium was not well visualized. Mitral Valve: The mitral valve is normal in structure. Mild mitral valve regurgitation. Tricuspid Valve: The tricuspid valve is normal in structure. Tricuspid valve regurgitation is trivial. Aortic Valve: The aortic valve is tricuspid. Aortic valve regurgitation is not visualized. No aortic stenosis is present. Aortic valve mean gradient measures 3.0 mmHg. Aortic valve peak gradient measures 5.2 mmHg. Aortic valve area, by VTI measures 2.86 cm. Pulmonic Valve: The pulmonic valve was grossly normal. Pulmonic valve regurgitation is trivial. No evidence of pulmonic stenosis. Aorta: The aortic root is normal in size and structure. Pulmonary Artery: The pulmonary artery is not well seen. Venous: The inferior vena cava was not well visualized. IAS/Shunts: The interatrial septum was not well visualized.  LEFT VENTRICLE PLAX 2D LVIDd:         4.43 cm  Diastology LVIDs:         2.77 cm  LV e' lateral:   7.40 cm/s LV PW:         1.19 cm  LV E/e' lateral: 8.1 LV IVS:        0.85 cm  LV e' medial:    9.57 cm/s LVOT diam:     2.20 cm  LV E/e' medial:  6.2 LV SV:         62 LV SV Index:   30 LVOT Area:     3.80 cm  RIGHT VENTRICLE RV Basal diam:  3.64 cm RV S prime:     13.80 cm/s LEFT ATRIUM           Index       RIGHT ATRIUM           Index LA diam:      2.70 cm 1.32 cm/m  RA Area:     19.90 cm LA Vol (A4C): 40.3 ml 19.72 ml/m RA Volume:   60.40  ml  29.55 ml/m  AORTIC VALVE                   PULMONIC VALVE AV Area (Vmax):    2.63 cm    PV Vmax:        0.70 m/s AV Area (Vmean):   2.56 cm    PV Peak grad:   2.0 mmHg AV Area (VTI):     2.86 cm    RVOT Peak grad: 3 mmHg AV Vmax:           114.00 cm/s AV Vmean:          84.200 cm/s AV VTI:            0.216 m AV Peak Grad:      5.2 mmHg AV Mean Grad:  3.0 mmHg LVOT Vmax:         79.00 cm/s LVOT Vmean:        56.800 cm/s LVOT VTI:          0.162 m LVOT/AV VTI ratio: 0.75  AORTA Ao Root diam: 2.90 cm MITRAL VALVE               TRICUSPID VALVE MV Area (PHT): 2.66 cm    TR Peak grad:   14.3 mmHg MV Decel Time: 285 msec    TR Vmax:        189.00 cm/s MV E velocity: 59.60 cm/s MV A velocity: 63.00 cm/s  SHUNTS MV E/A ratio:  0.95        Systemic VTI:  0.16 m                            Systemic Diam: 2.20 cm Yvonne Kendall MD Electronically signed by Yvonne Kendall MD Signature Date/Time: 05/11/2020/6:41:12 PM    Final         Scheduled Meds: . bictegravir-emtricitabine-tenofovir AF  1 tablet Oral Daily  . DULoxetine  90 mg Oral Daily  . enoxaparin (LOVENOX) injection  40 mg Subcutaneous Q24H  . folic acid  1 mg Oral Daily  . gabapentin  300 mg Oral TID  . ketorolac  15 mg Intravenous Q6H  . multivitamin with minerals  1 tablet Oral Daily  . pantoprazole  40 mg Oral Daily  . polycarbophil  625 mg Oral Daily  . thiamine  100 mg Oral Daily   Or  . thiamine  100 mg Intravenous Daily   Continuous Infusions: . cefTRIAXone (ROCEPHIN)  IV Stopped (05/11/20 2316)     LOS: 2 days    Time spent: 35 minutes    Tresa Moore, MD [facility] Pager [phone]  If 7PM-7AM, please contact night-coverage 05/12/2020, 12:43 PM

## 2020-05-13 LAB — CULTURE, BLOOD (ROUTINE X 2): Special Requests: ADEQUATE

## 2020-05-13 MED ORDER — CIPROFLOXACIN HCL 500 MG PO TABS
500.0000 mg | ORAL_TABLET | Freq: Two times a day (BID) | ORAL | 0 refills | Status: AC
Start: 2020-05-13 — End: 2020-05-25

## 2020-05-13 NOTE — Discharge Instructions (Signed)
Pyelonephritis, Adult °Pyelonephritis is an infection that occurs in the kidney. The kidneys are the organs that filter a person's blood and move waste out of the bloodstream and into the urine. Urine passes from the kidneys, through tubes called ureters, and into the bladder. There are two main types of pyelonephritis: °· Infections that come on quickly without any warning (acute pyelonephritis). °· Infections that last for a long period of time (chronic pyelonephritis). °In most cases, the infection clears up with treatment and does not cause further problems. More severe infections or chronic infections can sometimes spread to the bloodstream or lead to other problems with the kidneys. °What are the causes? °This condition is usually caused by: °· Bacteria traveling from the bladder up to the kidney. This may occur after having a bladder infection (cystitis) or urinary tract infection (UTI). °· Bladder infections caused from bacteria traveling from the bloodstream to the kidney. °What increases the risk? °This condition is more likely to develop in: °· Pregnant women. °· Older people. °· People who have any of these conditions: °? Diabetes. °? Inflammation of the prostate gland (prostatitis), in males. °? Kidney stones or bladder stones. °? Other abnormalities of the kidney or ureter. °? Cancer. °· People who have a catheter placed in the bladder. °· People who are sexually active. °· Women who use spermicides. °· People who have had a prior UTI. °What are the signs or symptoms? °Symptoms of this condition include: °· Frequent urination. °· Strong or persistent urge to urinate. °· Burning or stinging when urinating. °· Abdominal pain. °· Back pain. °· Pain in the side or flank area. °· Fever or chills. °· Blood in the urine, or dark urine. °· Nausea or vomiting. °How is this diagnosed? °This condition may be diagnosed based on: °· Your medical history and a physical exam. °· Urine tests. °· Blood tests. °You may  also have imaging tests of the kidneys, such as an ultrasound or CT scan. °How is this treated? °Treatment for this condition may depend on the severity of the infection. °· If the infection is mild and is found early, you may be treated with antibiotic medicines taken by mouth (orally). You will need to drink fluids to remain hydrated. °· If the infection is more severe, you may need to stay in the hospital and receive antibiotics given directly into a vein through an IV. You may also need to receive fluids through an IV if you are not able to remain hydrated. After your hospital stay, you may need to take oral antibiotics for a period of time. °Other treatments may be required, depending on the cause of the infection. °Follow these instructions at home: °Medicines °· Take your antibiotic medicine as told by your health care provider. Do not stop taking the antibiotic even if you start to feel better. °· Take over-the-counter and prescription medicines only as told by your health care provider. °General instructions ° °· Drink enough fluid to keep your urine pale yellow. °· Avoid caffeine, tea, and carbonated beverages. They tend to irritate the bladder. °· Urinate often. Avoid holding in urine for long periods of time. °· Urinate before and after sex. °· After a bowel movement, women should cleanse from front to back. Use each tissue only once. °· Keep all follow-up visits as told by your health care provider. This is important. °Contact a health care provider if: °· Your symptoms do not get better after 2 days of treatment. °· Your symptoms get worse. °·   You have a fever. °Get help right away if you: °· Are unable to take your antibiotics or fluids. °· Have shaking chills. °· Vomit. °· Have severe flank or back pain. °· Have extreme weakness or fainting. °Summary °· Pyelonephritis is a urinary tract infection (UTI) that occurs in the kidney. °· Treatment for this condition may depend on the severity of the  infection. °· Take your antibiotic medicine as told by your health care provider. Do not stop taking the antibiotic even if you start to feel better. °· Drink enough fluid to keep your urine pale yellow. °· Keep all follow-up visits as told by your health care provider. This is important. °This information is not intended to replace advice given to you by your health care provider. Make sure you discuss any questions you have with your health care provider. °Document Revised: 10/01/2018 Document Reviewed: 10/01/2018 °Elsevier Patient Education © 2020 [employer] ° °

## 2020-05-13 NOTE — Progress Notes (Signed)
Meghan Jarvis to be D/C'd home with husband per MD order.  Discussed prescriptions and follow up appointments with the patient. Prescriptions given to patient, medication list explained in detail. Pt verbalized understanding.  Allergies as of 05/13/2020       Reactions   Shellfish Allergy Other (See Comments)   Crab legs result in itching   Buprenorphine Hcl    Pt states she not allergic   Morphine And Related Itching   hives        Medication List     TAKE these medications    Biktarvy 50-200-25 MG Tabs tablet Generic drug: bictegravir-emtricitabine-tenofovir AF Take 1 tablet by mouth daily.   ciprofloxacin 500 MG tablet Commonly known as: Cipro Take 1 tablet (500 mg total) by mouth 2 (two) times daily for 12 days.   DULoxetine 30 MG capsule Commonly known as: CYMBALTA Take 90 mg by mouth daily.   gabapentin 300 MG capsule Commonly known as: NEURONTIN Take 300 mg by mouth 3 (three) times daily.   polycarbophil 625 MG tablet Commonly known as: FIBERCON Take 625 mg by mouth daily.        Vitals:   05/13/20 0344 05/13/20 0900  BP: 111/79 110/80  Pulse: (!) 58 65  Resp: 18   Temp: (!) 97.4 F (36.3 C)   SpO2: 100% 100%    Skin clean, dry and intact without evidence of skin break down, no evidence of skin tears noted. IV catheter discontinued intact. Site without signs and symptoms of complications. Dressing and pressure applied. Pt denies pain at this time. No complaints noted.  An After Visit Summary was printed and given to the patient. Patient escorted via WC, and D/C home via private auto.  Meghan Jarvis A Meghan Jarvis

## 2020-05-13 NOTE — Discharge Summary (Signed)
Physician Discharge Summary  KEONI HAVEY IOE:[id number] DOB: [birth year] DOA: 05/09/2020  PCP: Patient, No Pcp Per  Admit date: 05/09/2020 Discharge date: 05/13/2020  Admitted From: Home Disposition: Home  Recommendations for Outpatient Follow-up:  1. Follow up with PCP in 1-2 weeks 2.   Home Health: No Equipment/Devices: None Discharge Condition: Stable CODE STATUS: Full Diet recommendation: Heart Healthy / Carb Modified  Brief/Interim Summary: Patient is a 44 year old female with PMH significant for HIV last CD4 count 342, ongoing alcohol use with no history of complicated withdrawal was admitted 05/09/2020 with pyelonephritis. CT abdomen did not show any intestinal abnormalities, no renal stones, no perinephric stranding.  She was started on ceftriaxone however overnight 1 out of 4 blood cultures have become positive with E. Coli, sensitivities pending, so she was transitioned to meropenem per protocol.  On 05/10/2020 patient developed spontaneous A. fib with RVR, new onset.  6/2: Patient seen and examined.  Visibly no distress.  Has lower abdominal pain.  Previous notes indicated transition to meropenem however per pharmacy patient has only received 1 dose and was placed back on Rocephin.  6/3: Patient seen and examined the day of discharge.  No complaints, feels well.  Urinating without difficulty.  No fevers over interval.  Repeat blood culture sensitivities reviewed.  Sensitive to ciprofloxacin.  Will prescribe additional 12 days of ciprofloxacin to complete total 14-day course for gram-negative bacteremia.  Discharge Diagnoses:  Principal Problem:   Pyelonephritis Active Problems:   Alcohol use disorder, severe, dependence (HCC)   HIV (human immunodeficiency virus infection) (HCC)   Atrial fibrillation, new onset (HCC)  Acute pyelonephritis E. coli bacteremia 1/4+ for E. coli from blood cultures Further sensitivities pending Discussed with pharmacy.  Patient received  1 dose of meropenem however was never been started on it regularly Currently on Rocephin and appears to be clinically responding No fevers noted over interval, no signs of clinical worsening Plan: Continue Rocephin Follow updated cultures Consider transition to p.o. antibiotics and discharge home tomorrow if blood culture results are reassuring  New onset atrial fibrillation Noted on telemetry Resolved spontaneously without intervention No history of hypertension, diabetes, heart failure Echocardiogram essentially normal No further episodes of dysrhythmia on telemetry Plan: Continue to monitor on telemetry No medication additions for now Repeat twelve-lead EKG and consider rate control anticoagulation if dysrhythmia recurs  Alcohol use disorder No history of withdrawals Not scoring on CIWA Can continue CIWA protocol for now  HIV Continue home Biktarvy  Depression Continue home duloxetine   Discharge Instructions  Discharge Instructions    Diet - low sodium heart healthy   Complete by: As directed    Increase activity slowly   Complete by: As directed      Allergies as of 05/13/2020      Reactions   Shellfish Allergy Other (See Comments)   Crab legs result in itching   Buprenorphine Hcl    Pt states she not allergic   Morphine And Related Itching   hives      Medication List    TAKE these medications   Biktarvy 50-200-25 MG Tabs tablet Generic drug: bictegravir-emtricitabine-tenofovir AF Take 1 tablet by mouth daily.   ciprofloxacin 500 MG tablet Commonly known as: Cipro Take 1 tablet (500 mg total) by mouth 2 (two) times daily for 12 days.   DULoxetine 30 MG capsule Commonly known as: CYMBALTA Take 90 mg by mouth daily.   gabapentin 300 MG capsule Commonly known as: NEURONTIN Take 300 mg by mouth 3 (three)  times daily.   polycarbophil 625 MG tablet Commonly known as: FIBERCON Take 625 mg by mouth daily.       Allergies  Allergen Reactions   . Shellfish Allergy Other (See Comments)    Crab legs result in itching  . Buprenorphine Hcl     Pt states she not allergic  . Morphine And Related Itching    hives    Consultations:  none   Procedures/Studies: DG Chest 2 View  Result Date: 05/09/2020 CLINICAL DATA:  Lower back and flank pain bilaterally for couple of weeks. EXAM: CHEST - 2 VIEW COMPARISON:  October 19, 2017 FINDINGS: The cardiomediastinal silhouette is normal. No pneumothorax. Mildly coarsened lung markings. No focal infiltrates. IMPRESSION: Mildly coarsened lung markings may be due to the patient's smoking history. No focal infiltrates or acute abnormalities are identified. Electronically Signed   By: Gerome Sam III M.D   On: 05/09/2020 12:34   CT Abdomen Pelvis W Contrast  Result Date: 05/09/2020 CLINICAL DATA:  Left abdominal and flank pain for several weeks. Vomiting. Fever. HIV. EXAM: CT ABDOMEN AND PELVIS WITH CONTRAST TECHNIQUE: Multidetector CT imaging of the abdomen and pelvis was performed using the standard protocol following bolus administration of intravenous contrast. CONTRAST:  OMNIPAQUE IOHEXOL 300 MG/ML  SOLN COMPARISON:  11/24/2018 FINDINGS: Lower Chest: No acute findings. Hepatobiliary: No hepatic masses identified. Mild hepatic steatosis. Gallbladder is unremarkable. No evidence of biliary ductal dilatation. Pancreas:  No mass or inflammatory changes. Spleen: Within normal limits in size and appearance. Adrenals/Urinary Tract: No masses identified. No evidence of ureteral calculi or hydronephrosis. Stomach/Bowel: No evidence of obstruction, inflammatory process or abnormal fluid collections. Vascular/Lymphatic: Shotty less than 1 cm bilateral pelvic lymph nodes are seen in the external iliac and obturator chains, without significant change the compared to prior 2019 study, consistent with the benign etiology. No pathologically enlarged lymph nodes. No abdominal aortic aneurysm. Reproductive:  Prior hysterectomy noted. Adnexal regions are unremarkable in appearance. Other: Surgical mesh seen in anterior abdominal wall and paraumbilical region. No evidence of recurrent hernia or mass. Musculoskeletal:  No suspicious bone lesions identified. IMPRESSION: 1. No acute findings within the abdomen or pelvis. 2. Mild hepatic steatosis. Electronically Signed   By: Danae Orleans M.D.   On: 05/09/2020 13:44   ECHOCARDIOGRAM COMPLETE  Result Date: 05/11/2020    ECHOCARDIOGRAM REPORT   Patient Name:   [name] Date of Exam: 05/11/2020 Medical Rec #:  [id number]       Height:       68.0 in Accession #:    [id number]      Weight:       200.0 lb Date of Birth:  March 03, 1976       BSA:          2.044 m Patient Age:    43 years        BP:           123/85 mmHg Patient Gender: F               HR:           72 bpm. Exam Location:  [facility] Procedure: 2D Echo, Cardiac Doppler and Color Doppler Indications:     Atrial Fibrillation 427.31  History:         Patient has no prior history of Echocardiogram examinations. No                  cardiac history listed in chart.  Sonographer:  Cristela Blue RDCS (AE) Referring Phys:  [id number] Raynaldo Opitz CHATTERJEE Diagnosing Phys: Cristal Deer End MD  Sonographer Comments: Suboptimal apical window. IMPRESSIONS  1. Left ventricular ejection fraction, by estimation, is 55 to 60%. The left ventricle has normal function. The left ventricle has no regional wall motion abnormalities. There is mild left ventricular hypertrophy. Left ventricular diastolic parameters were normal.  2. Right ventricular systolic function is normal. The right ventricular size is normal. There is normal pulmonary artery systolic pressure.  3. Right atrial size was mildly dilated.  4. The mitral valve is normal in structure. Mild mitral valve regurgitation.  5. The aortic valve is tricuspid. Aortic valve regurgitation is not visualized. No aortic stenosis is present. FINDINGS  Left Ventricle: Left ventricular ejection  fraction, by estimation, is 55 to 60%. The left ventricle has normal function. The left ventricle has no regional wall motion abnormalities. The left ventricular internal cavity size was normal in size. There is  mild left ventricular hypertrophy. Left ventricular diastolic parameters were normal. Right Ventricle: The right ventricular size is normal. No increase in right ventricular wall thickness. Right ventricular systolic function is normal. There is normal pulmonary artery systolic pressure. Left Atrium: Left atrial size was normal in size. Right Atrium: Right atrial size was mildly dilated. Pericardium: The pericardium was not well visualized. Mitral Valve: The mitral valve is normal in structure. Mild mitral valve regurgitation. Tricuspid Valve: The tricuspid valve is normal in structure. Tricuspid valve regurgitation is trivial. Aortic Valve: The aortic valve is tricuspid. Aortic valve regurgitation is not visualized. No aortic stenosis is present. Aortic valve mean gradient measures 3.0 mmHg. Aortic valve peak gradient measures 5.2 mmHg. Aortic valve area, by VTI measures 2.86 cm. Pulmonic Valve: The pulmonic valve was grossly normal. Pulmonic valve regurgitation is trivial. No evidence of pulmonic stenosis. Aorta: The aortic root is normal in size and structure. Pulmonary Artery: The pulmonary artery is not well seen. Venous: The inferior vena cava was not well visualized. IAS/Shunts: The interatrial septum was not well visualized.  LEFT VENTRICLE PLAX 2D LVIDd:         4.43 cm  Diastology LVIDs:         2.77 cm  LV e' lateral:   7.40 cm/s LV PW:         1.19 cm  LV E/e' lateral: 8.1 LV IVS:        0.85 cm  LV e' medial:    9.57 cm/s LVOT diam:     2.20 cm  LV E/e' medial:  6.2 LV SV:         62 LV SV Index:   30 LVOT Area:     3.80 cm  RIGHT VENTRICLE RV Basal diam:  3.64 cm RV S prime:     13.80 cm/s LEFT ATRIUM           Index       RIGHT ATRIUM           Index LA diam:      2.70 cm 1.32 cm/m  RA  Area:     19.90 cm LA Vol (A4C): 40.3 ml 19.72 ml/m RA Volume:   60.40 ml  29.55 ml/m  AORTIC VALVE                   PULMONIC VALVE AV Area (Vmax):    2.63 cm    PV Vmax:        0.70 m/s AV Area (Vmean):   2.56 cm  PV Peak grad:   2.0 mmHg AV Area (VTI):     2.86 cm    RVOT Peak grad: 3 mmHg AV Vmax:           114.00 cm/s AV Vmean:          84.200 cm/s AV VTI:            0.216 m AV Peak Grad:      5.2 mmHg AV Mean Grad:      3.0 mmHg LVOT Vmax:         79.00 cm/s LVOT Vmean:        56.800 cm/s LVOT VTI:          0.162 m LVOT/AV VTI ratio: 0.75  AORTA Ao Root diam: 2.90 cm MITRAL VALVE               TRICUSPID VALVE MV Area (PHT): 2.66 cm    TR Peak grad:   14.3 mmHg MV Decel Time: 285 msec    TR Vmax:        189.00 cm/s MV E velocity: 59.60 cm/s MV A velocity: 63.00 cm/s  SHUNTS MV E/A ratio:  0.95        Systemic VTI:  0.16 m                            Systemic Diam: 2.20 cm Yvonne Kendallhristopher End MD Electronically signed by Yvonne Kendallhristopher End MD Signature Date/Time: 05/11/2020/6:41:12 PM    Final     (Echo, Carotid, EGD, Colonoscopy, ERCP)    Subjective: Seen and examined on the day of discharge.  No complaints, feels well.  Urinating without difficulty.  No fevers over interval.  Stable for discharge home.  Discharge Exam: Vitals:   05/13/20 0344 05/13/20 0900  BP: 111/79 110/80  Pulse: (!) 58 65  Resp: 18   Temp: (!) 97.4 F (36.3 C)   SpO2: 100% 100%   Vitals:   05/12/20 1226 05/12/20 2126 05/13/20 0344 05/13/20 0900  BP: 119/78 105/85 111/79 110/80  Pulse: 61 69 (!) 58 65  Resp: 18 20 18    Temp: 98 F (36.7 C) 98.1 F (36.7 C) (!) 97.4 F (36.3 C)   TempSrc: Oral Oral Oral Oral  SpO2: 98% 100% 100% 100%  Weight:      Height:        General: Pt is alert, awake, not in acute distress Cardiovascular: RRR, S1/S2 +, no rubs, no gallops Respiratory: CTA bilaterally, no wheezing, no rhonchi Abdominal: Soft, NT, ND, bowel sounds + Extremities: no edema, no cyanosis    The results  of significant diagnostics from this hospitalization (including imaging, microbiology, ancillary and laboratory) are listed below for reference.     Microbiology: Recent Results (from the past 240 hour(s))  Blood culture (routine x 2)     Status: Abnormal   Collection Time: 05/09/20 12:22 PM   Specimen: BLOOD  Result Value Ref Range Status   Specimen Description   Final    BLOOD RIGHT FOREARM Performed at [facility], [address] [place], [place] [address]    Special Requests   Final    BOTTLES DRAWN AEROBIC AND ANAEROBIC Blood Culture adequate volume Performed at [facility], [address] [place], [place] [address]    Culture  Setup Time   Final    ANAEROBIC BOTTLE ONLY GRAM NEGATIVE RODS CRITICAL RESULT CALLED TO, READ BACK BY AND VERIFIED WITH: SCOTT HALL  ON 05/10/20 AT 0145 [facility] Performed at [facility], [address], [place], [place] [address]    Culture ESCHERICHIA COLI (A)  Final   Report Status 05/13/2020 FINAL  Final   Organism ID, Bacteria ESCHERICHIA COLI  Final      Susceptibility   Escherichia coli - MIC*    AMPICILLIN >=32 RESISTANT Resistant     CEFAZOLIN 16 SENSITIVE Sensitive     CEFEPIME <=1 SENSITIVE Sensitive     CEFTAZIDIME <=1 SENSITIVE Sensitive     CEFTRIAXONE <=1 SENSITIVE Sensitive     CIPROFLOXACIN <=0.25 SENSITIVE Sensitive     GENTAMICIN <=1 SENSITIVE Sensitive     IMIPENEM <=0.25 SENSITIVE Sensitive     TRIMETH/SULFA <=20 SENSITIVE Sensitive     AMPICILLIN/SULBACTAM >=32 RESISTANT Resistant     PIP/TAZO <=4 SENSITIVE Sensitive     * ESCHERICHIA COLI  Blood Culture ID Panel (Reflexed)     Status: Abnormal   Collection Time: 05/09/20 12:22 PM  Result Value Ref Range Status   Enterococcus species NOT DETECTED NOT DETECTED Final   Listeria monocytogenes NOT DETECTED NOT DETECTED Final   Staphylococcus species NOT DETECTED NOT DETECTED Final   Staphylococcus aureus (BCID) NOT DETECTED NOT DETECTED Final    Streptococcus species NOT DETECTED NOT DETECTED Final   Streptococcus agalactiae NOT DETECTED NOT DETECTED Final   Streptococcus pneumoniae NOT DETECTED NOT DETECTED Final   Streptococcus pyogenes NOT DETECTED NOT DETECTED Final   Acinetobacter baumannii NOT DETECTED NOT DETECTED Final   Enterobacteriaceae species DETECTED (A) NOT DETECTED Final    Comment: Enterobacteriaceae represent a large family of gram-negative bacteria, not a single organism. CRITICAL RESULT CALLED TO, READ BACK BY AND VERIFIED WITH: SCOTT HALL ON 05/10/20 AT 0145 [facility]    Enterobacter cloacae complex NOT DETECTED NOT DETECTED Final   Escherichia coli DETECTED (A) NOT DETECTED Final    Comment: CRITICAL RESULT CALLED TO, READ BACK BY AND VERIFIED WITH: SCOTT HALL ON 05/10/20 AT 0145 [facility]    Klebsiella oxytoca NOT DETECTED NOT DETECTED Final   Klebsiella pneumoniae NOT DETECTED NOT DETECTED Final   Proteus species NOT DETECTED NOT DETECTED Final   Serratia marcescens NOT DETECTED NOT DETECTED Final   Carbapenem resistance NOT DETECTED NOT DETECTED Final   Haemophilus influenzae NOT DETECTED NOT DETECTED Final   Neisseria meningitidis NOT DETECTED NOT DETECTED Final   Pseudomonas aeruginosa NOT DETECTED NOT DETECTED Final   Candida albicans NOT DETECTED NOT DETECTED Final   Candida glabrata NOT DETECTED NOT DETECTED Final   Candida krusei NOT DETECTED NOT DETECTED Final   Candida parapsilosis NOT DETECTED NOT DETECTED Final   Candida tropicalis NOT DETECTED NOT DETECTED Final    Comment: Performed at [facility], [address] [place], [place] [address]  Blood culture (routine x 2)     Status: None (Preliminary result)   Collection Time: 05/09/20 12:46 PM   Specimen: BLOOD  Result Value Ref Range Status   Specimen Description BLOOD LEFT WRIST  Final   Special Requests   Final    BOTTLES DRAWN AEROBIC AND ANAEROBIC Blood Culture adequate volume   Culture   Final    NO GROWTH 4 DAYS Performed at  [facility], [address] [place], [place] [address]    Report Status PENDING  Incomplete  SARS Coronavirus 2 by RT PCR (hospital order, performed in [facility] hospital lab) Nasopharyngeal Nasopharyngeal Swab     Status: None   Collection Time: 05/09/20 12:50 PM  Specimen: Nasopharyngeal Swab  Result Value Ref Range Status   SARS Coronavirus 2 NEGATIVE NEGATIVE Final    Comment: (NOTE) SARS-CoV-2 target nucleic acids are NOT DETECTED. The SARS-CoV-2 RNA is generally detectable in upper and lower respiratory specimens during the acute phase of infection. The lowest concentration of SARS-CoV-2 viral copies this assay can detect is 250 copies / mL. A negative result does not preclude SARS-CoV-2 infection and should not be used as the sole basis for treatment or other patient management decisions.  A negative result may occur with improper specimen collection / handling, submission of specimen other than nasopharyngeal swab, presence of viral mutation(s) within the areas targeted by this assay, and inadequate number of viral copies (<250 copies / mL). A negative result must be combined with clinical observations, patient history, and epidemiological information. Fact Sheet for Patients:   BoilerBrush.com.cy Fact Sheet for Healthcare Providers: https://pope.com/ This test is not yet approved or cleared  by the [place] FDA and has been authorized for detection and/or diagnosis of SARS-CoV-2 by FDA under an Emergency Use Authorization (EUA).  This EUA will remain in effect (meaning this test can be used) for the duration of the COVID-19 declaration under Section 564(b)(1) of the Act, 21 U.S.C. section 360bbb-3(b)(1), unless the authorization is terminated or revoked sooner. Performed at [facility], [address], [place], [place] [address]   Urine culture     Status: Abnormal   Collection Time: 05/09/20  12:50 PM   Specimen: Urine, Random  Result Value Ref Range Status   Specimen Description   Final    URINE, RANDOM Performed at [facility], [address], [place], [place] [address]    Special Requests   Final    NONE Performed at [facility], [address] [place], [place] [address]    Culture >=100,000 COLONIES/mL ESCHERICHIA COLI (A)  Final   Report Status 05/11/2020 FINAL  Final   Organism ID, Bacteria ESCHERICHIA COLI (A)  Final      Susceptibility   Escherichia coli - MIC*    AMPICILLIN >=32 RESISTANT Resistant     CEFAZOLIN 16 SENSITIVE Sensitive     CEFTRIAXONE <=1 SENSITIVE Sensitive     CIPROFLOXACIN <=0.25 SENSITIVE Sensitive     GENTAMICIN <=1 SENSITIVE Sensitive     IMIPENEM <=0.25 SENSITIVE Sensitive     NITROFURANTOIN <=16 SENSITIVE Sensitive     TRIMETH/SULFA <=20 SENSITIVE Sensitive     AMPICILLIN/SULBACTAM >=32 RESISTANT Resistant     PIP/TAZO <=4 SENSITIVE Sensitive     * >=100,000 COLONIES/mL ESCHERICHIA COLI     Labs: BNP (last 3 results) No results for input(s): BNP in the last 8760 hours. Basic Metabolic Panel: Recent Labs  Lab 05/09/20 1222 05/10/20 0524 05/11/20 0602 05/12/20 0545  NA 133* 134* 135 138  K 3.6 4.0 3.6 3.7  CL 100 100 103 105  CO2 22 25 26 26   GLUCOSE 150* 76 82 98  BUN 9 10 6  <5*  CREATININE 0.90 0.62 0.65 0.68  CALCIUM 9.1 8.5* 7.9* 8.3*  MG 1.9  --   --  2.0  PHOS 2.9  --   --   --    Liver Function Tests: Recent Labs  Lab 05/09/20 1222  AST 34  ALT 24  ALKPHOS 76  BILITOT 0.7  PROT 9.6*  ALBUMIN 3.6   Recent Labs  Lab 05/09/20 1222  LIPASE 27   No results for input(s): AMMONIA in the last 168 hours. CBC: Recent  Labs  Lab 05/09/20 1222 05/10/20 0524 05/11/20 0602 05/12/20 0545  WBC 11.3* 7.1 5.9 5.1  NEUTROABS  --   --  3.0  --   HGB 13.2 13.3 11.3* 12.0  HCT 39.3 39.9 34.6* 35.7*  MCV 80.5 80.4 81.0 79.3*  PLT 236 226 217 229   Cardiac Enzymes: No results for  input(s): CKTOTAL, CKMB, CKMBINDEX, TROPONINI in the last 168 hours. BNP: Invalid input(s): POCBNP CBG: No results for input(s): GLUCAP in the last 168 hours. D-Dimer No results for input(s): DDIMER in the last 72 hours. Hgb A1c No results for input(s): HGBA1C in the last 72 hours. Lipid Profile No results for input(s): CHOL, HDL, LDLCALC, TRIG, CHOLHDL, LDLDIRECT in the last 72 hours. Thyroid function studies No results for input(s): TSH, T4TOTAL, T3FREE, THYROIDAB in the last 72 hours.  Invalid input(s): FREET3 Anemia work up No results for input(s): VITAMINB12, FOLATE, FERRITIN, TIBC, IRON, RETICCTPCT in the last 72 hours. Urinalysis    Component Value Date/Time   COLORURINE YELLOW (A) 05/09/2020 1250   APPEARANCEUR CLOUDY (A) 05/09/2020 1250   APPEARANCEUR Clear 08/18/2014 1354   LABSPEC 1.014 05/09/2020 1250   LABSPEC 1.020 08/18/2014 1354   PHURINE 5.0 05/09/2020 1250   GLUCOSEU 50 (A) 05/09/2020 1250   GLUCOSEU 50 mg/dL 08/18/2014 1354   HGBUR SMALL (A) 05/09/2020 1250   BILIRUBINUR NEGATIVE 05/09/2020 1250   BILIRUBINUR Negative 08/18/2014 1354   KETONESUR 20 (A) 05/09/2020 1250   PROTEINUR 30 (A) 05/09/2020 1250   NITRITE POSITIVE (A) 05/09/2020 1250   LEUKOCYTESUR LARGE (A) 05/09/2020 1250   LEUKOCYTESUR Negative 08/18/2014 1354   Sepsis Labs Invalid input(s): PROCALCITONIN,  WBC,  LACTICIDVEN Microbiology Recent Results (from the past 240 hour(s))  Blood culture (routine x 2)     Status: Abnormal   Collection Time: 05/09/20 12:22 PM   Specimen: BLOOD  Result Value Ref Range Status   Specimen Description   Final    BLOOD RIGHT FOREARM Performed at [facility], [address], [place], [place] [address]    Special Requests   Final    BOTTLES DRAWN AEROBIC AND ANAEROBIC Blood Culture adequate volume Performed at [facility], [address] [place], [place] [address]    Culture  Setup Time   Final    ANAEROBIC BOTTLE ONLY GRAM  NEGATIVE RODS CRITICAL RESULT CALLED TO, READ BACK BY AND VERIFIED WITH: SCOTT HALL ON 05/10/20 AT 0145 [facility] Performed at [facility], [place] [address] [place], [place] [address]    Culture ESCHERICHIA COLI (A)  Final   Report Status 05/13/2020 FINAL  Final   Organism ID, Bacteria ESCHERICHIA COLI  Final      Susceptibility   Escherichia coli - MIC*    AMPICILLIN >=32 RESISTANT Resistant     CEFAZOLIN 16 SENSITIVE Sensitive     CEFEPIME <=1 SENSITIVE Sensitive     CEFTAZIDIME <=1 SENSITIVE Sensitive     CEFTRIAXONE <=1 SENSITIVE Sensitive     CIPROFLOXACIN <=0.25 SENSITIVE Sensitive     GENTAMICIN <=1 SENSITIVE Sensitive     IMIPENEM <=0.25 SENSITIVE Sensitive     TRIMETH/SULFA <=20 SENSITIVE Sensitive     AMPICILLIN/SULBACTAM >=32 RESISTANT Resistant     PIP/TAZO <=4 SENSITIVE Sensitive     * ESCHERICHIA COLI  Blood Culture ID Panel (Reflexed)     Status: Abnormal   Collection Time: 05/09/20 12:22 PM  Result Value Ref Range Status   Enterococcus species NOT DETECTED NOT DETECTED Final   Listeria  monocytogenes NOT DETECTED NOT DETECTED Final   Staphylococcus species NOT DETECTED NOT DETECTED Final   Staphylococcus aureus (BCID) NOT DETECTED NOT DETECTED Final   Streptococcus species NOT DETECTED NOT DETECTED Final   Streptococcus agalactiae NOT DETECTED NOT DETECTED Final   Streptococcus pneumoniae NOT DETECTED NOT DETECTED Final   Streptococcus pyogenes NOT DETECTED NOT DETECTED Final   Acinetobacter baumannii NOT DETECTED NOT DETECTED Final   Enterobacteriaceae species DETECTED (A) NOT DETECTED Final    Comment: Enterobacteriaceae represent a large family of gram-negative bacteria, not a single organism. CRITICAL RESULT CALLED TO, READ BACK BY AND VERIFIED WITH: SCOTT HALL ON 05/10/20 AT 0145 [facility]    Enterobacter cloacae complex NOT DETECTED NOT DETECTED Final   Escherichia coli DETECTED (A) NOT DETECTED Final    Comment: CRITICAL RESULT CALLED TO, READ BACK BY AND  VERIFIED WITH: SCOTT HALL ON 05/10/20 AT 0145 [facility]    Klebsiella oxytoca NOT DETECTED NOT DETECTED Final   Klebsiella pneumoniae NOT DETECTED NOT DETECTED Final   Proteus species NOT DETECTED NOT DETECTED Final   Serratia marcescens NOT DETECTED NOT DETECTED Final   Carbapenem resistance NOT DETECTED NOT DETECTED Final   Haemophilus influenzae NOT DETECTED NOT DETECTED Final   Neisseria meningitidis NOT DETECTED NOT DETECTED Final   Pseudomonas aeruginosa NOT DETECTED NOT DETECTED Final   Candida albicans NOT DETECTED NOT DETECTED Final   Candida glabrata NOT DETECTED NOT DETECTED Final   Candida krusei NOT DETECTED NOT DETECTED Final   Candida parapsilosis NOT DETECTED NOT DETECTED Final   Candida tropicalis NOT DETECTED NOT DETECTED Final    Comment: Performed at [facility], [address] [place], [place] [address]  Blood culture (routine x 2)     Status: None (Preliminary result)   Collection Time: 05/09/20 12:46 PM   Specimen: BLOOD  Result Value Ref Range Status   Specimen Description BLOOD LEFT WRIST  Final   Special Requests   Final    BOTTLES DRAWN AEROBIC AND ANAEROBIC Blood Culture adequate volume   Culture   Final    NO GROWTH 4 DAYS Performed at [facility], [address] [place], [place] [address]    Report Status PENDING  Incomplete  SARS Coronavirus 2 by RT PCR (hospital order, performed in [facility] hospital lab) Nasopharyngeal Nasopharyngeal Swab     Status: None   Collection Time: 05/09/20 12:50 PM   Specimen: Nasopharyngeal Swab  Result Value Ref Range Status   SARS Coronavirus 2 NEGATIVE NEGATIVE Final    Comment: (NOTE) SARS-CoV-2 target nucleic acids are NOT DETECTED. The SARS-CoV-2 RNA is generally detectable in upper and lower respiratory specimens during the acute phase of infection. The lowest concentration of SARS-CoV-2 viral copies this assay can detect is 250 copies / mL. A negative result does not preclude SARS-CoV-2  infection and should not be used as the sole basis for treatment or other patient management decisions.  A negative result may occur with improper specimen collection / handling, submission of specimen other than nasopharyngeal swab, presence of viral mutation(s) within the areas targeted by this assay, and inadequate number of viral copies (<250 copies / mL). A negative result must be combined with clinical observations, patient history, and epidemiological information. Fact Sheet for Patients:   BoilerBrush.com.cy Fact Sheet for Healthcare Providers: https://pope.com/ This test is not yet approved or cleared  by the [place] FDA and has been authorized for detection and/or diagnosis of SARS-CoV-2 by FDA under an Emergency Use Authorization (EUA).  This EUA will remain in effect (meaning this test can be used) for the duration of the COVID-19 declaration under Section 564(b)(1) of the Act, 21 U.S.C. section 360bbb-3(b)(1), unless the authorization is terminated or revoked sooner. Performed at [facility], [address] [place], [place] [address]   Urine culture     Status: Abnormal   Collection Time: 05/09/20 12:50 PM   Specimen: Urine, Random  Result Value Ref Range Status   Specimen Description   Final    URINE, RANDOM Performed at [facility], [address] [place], [place] [address]    Special Requests   Final    NONE Performed at [facility], [address] [place], [place] [address]    Culture >=100,000 COLONIES/mL ESCHERICHIA COLI (A)  Final   Report Status 05/11/2020 FINAL  Final   Organism ID, Bacteria ESCHERICHIA COLI (A)  Final      Susceptibility   Escherichia coli - MIC*    AMPICILLIN >=32 RESISTANT Resistant     CEFAZOLIN 16 SENSITIVE Sensitive     CEFTRIAXONE <=1 SENSITIVE Sensitive     CIPROFLOXACIN <=0.25 SENSITIVE Sensitive     GENTAMICIN <=1 SENSITIVE Sensitive      IMIPENEM <=0.25 SENSITIVE Sensitive     NITROFURANTOIN <=16 SENSITIVE Sensitive     TRIMETH/SULFA <=20 SENSITIVE Sensitive     AMPICILLIN/SULBACTAM >=32 RESISTANT Resistant     PIP/TAZO <=4 SENSITIVE Sensitive     * >=100,000 COLONIES/mL ESCHERICHIA COLI     Time coordinating discharge: Over 30 minutes  SIGNED:   Tresa Moore, MD  [facility] 05/13/2020, 11:46 AM Pager   If 7PM-7AM, please contact night-coverage

## 2020-05-14 LAB — CULTURE, BLOOD (ROUTINE X 2)
Culture: NO GROWTH
Special Requests: ADEQUATE

## 2020-07-26 ENCOUNTER — Ambulatory Visit: Payer: Medicaid Other | Attending: Internal Medicine

## 2020-07-26 DIAGNOSIS — Z23 Encounter for immunization: Secondary | ICD-10-CM

## 2020-07-26 NOTE — Progress Notes (Signed)
   Covid-19 Vaccination Clinic  Name:  Meghan Jarvis    MRN: [id number] DOB: 1976/05/05  07/26/2020  [name] was observed post Covid-19 immunization for 30 minutes based on pre-vaccination screening without incident. She was provided with Vaccine Information Sheet and instruction to access the V-Safe system.   [name] was instructed to call 911 with any severe reactions post vaccine: Marland Kitchen Difficulty breathing  . Swelling of face and throat  . A fast heartbeat  . A bad rash all over body  . Dizziness and weakness   Immunizations Administered    Name Date Dose VIS Date Route   Pfizer COVID-19 Vaccine 07/26/2020  3:27 PM 0.3 mL 02/04/2019 Intramuscular   Manufacturer: [employer], [employer]   Lot: [id number]   NDC: [address]-1

## 2020-08-15 ENCOUNTER — Emergency Department: Payer: Medicaid Other

## 2020-08-15 ENCOUNTER — Other Ambulatory Visit: Payer: Self-pay

## 2020-08-15 ENCOUNTER — Emergency Department
Admission: EM | Admit: 2020-08-15 | Discharge: 2020-08-15 | Disposition: A | Discharge status: Home / Self Care | Payer: Medicaid Other | Attending: Emergency Medicine | Admitting: Emergency Medicine

## 2020-08-15 DIAGNOSIS — I4891 Unspecified atrial fibrillation: Secondary | ICD-10-CM | POA: Insufficient documentation

## 2020-08-15 DIAGNOSIS — Z79899 Other long term (current) drug therapy: Secondary | ICD-10-CM | POA: Diagnosis not present

## 2020-08-15 DIAGNOSIS — J45909 Unspecified asthma, uncomplicated: Secondary | ICD-10-CM | POA: Diagnosis not present

## 2020-08-15 DIAGNOSIS — F17210 Nicotine dependence, cigarettes, uncomplicated: Secondary | ICD-10-CM | POA: Insufficient documentation

## 2020-08-15 DIAGNOSIS — N12 Tubulo-interstitial nephritis, not specified as acute or chronic: Secondary | ICD-10-CM | POA: Diagnosis not present

## 2020-08-15 DIAGNOSIS — R109 Unspecified abdominal pain: Secondary | ICD-10-CM | POA: Diagnosis present

## 2020-08-15 LAB — COMPREHENSIVE METABOLIC PANEL
ALT: 20 U/L (ref 0–44)
AST: 32 U/L (ref 15–41)
Albumin: 3.6 g/dL (ref 3.5–5.0)
Alkaline Phosphatase: 62 U/L (ref 38–126)
Anion gap: 8 (ref 5–15)
BUN: 11 mg/dL (ref 6–20)
CO2: 24 mmol/L (ref 22–32)
Calcium: 9.1 mg/dL (ref 8.9–10.3)
Chloride: 105 mmol/L (ref 98–111)
Creatinine, Ser: 0.85 mg/dL (ref 0.44–1.00)
GFR calc Af Amer: >60 mL/min (ref >60)
GFR calc non Af Amer: >60 mL/min (ref >60)
Glucose, Bld: 112 mg/dL — ABNORMAL HIGH (ref 70–99)
Potassium: 3.9 mmol/L (ref 3.5–5.1)
Sodium: 137 mmol/L (ref 135–145)
Total Bilirubin: 0.8 mg/dL (ref 0.3–1.2)
Total Protein: 8.6 g/dL — ABNORMAL HIGH (ref 6.5–8.1)

## 2020-08-15 LAB — URINALYSIS, COMPLETE (UACMP) WITH MICROSCOPIC
Bilirubin Urine: NEGATIVE
Glucose, UA: NEGATIVE mg/dL
Hgb urine dipstick: NEGATIVE
Ketones, ur: NEGATIVE mg/dL
Nitrite: NEGATIVE
Protein, ur: NEGATIVE mg/dL
Specific Gravity, Urine: 1.018 (ref 1.005–1.030)
WBC, UA: >50 WBC/hpf — ABNORMAL HIGH (ref 0–5)
pH: 6 (ref 5.0–8.0)

## 2020-08-15 LAB — CBC WITH DIFFERENTIAL/PLATELET
Abs Immature Granulocytes: 0 10*3/uL (ref 0.00–0.07)
Basophils Absolute: 0 10*3/uL (ref 0.0–0.1)
Basophils Relative: 0 %
Eosinophils Absolute: 0.3 10*3/uL (ref 0.0–0.5)
Eosinophils Relative: 7 %
HCT: 35.5 % — ABNORMAL LOW (ref 36.0–46.0)
Hemoglobin: 11.5 g/dL — ABNORMAL LOW (ref 12.0–15.0)
Immature Granulocytes: 0 %
Lymphocytes Relative: 55 %
Lymphs Abs: 2.5 10*3/uL (ref 0.7–4.0)
MCH: 26.3 pg (ref 26.0–34.0)
MCHC: 32.4 g/dL (ref 30.0–36.0)
MCV: 81.2 fL (ref 80.0–100.0)
Monocytes Absolute: 0.3 10*3/uL (ref 0.1–1.0)
Monocytes Relative: 7 %
Neutro Abs: 1.5 10*3/uL — ABNORMAL LOW (ref 1.7–7.7)
Neutrophils Relative %: 31 %
Platelets: 200 10*3/uL (ref 150–400)
RBC: 4.37 MIL/uL (ref 3.87–5.11)
RDW: 16.7 % — ABNORMAL HIGH (ref 11.5–15.5)
WBC: 4.7 10*3/uL (ref 4.0–10.5)
nRBC: 0 % (ref 0.0–0.2)

## 2020-08-15 LAB — POCT PREGNANCY, URINE: Preg Test, Ur: NEGATIVE

## 2020-08-15 MED ORDER — ONDANSETRON 4 MG PO TBDP
4.0000 mg | ORAL_TABLET | Freq: Once | ORAL | Status: AC
  Administered 2020-08-15: 4 mg via ORAL
  Filled 2020-08-15: qty 1

## 2020-08-15 MED ORDER — FENTANYL CITRATE (PF) 100 MCG/2ML IJ SOLN
50.0000 ug | Freq: Once | INTRAMUSCULAR | Status: DC
  Filled 2020-08-15: qty 2

## 2020-08-15 MED ORDER — FENTANYL CITRATE (PF) 100 MCG/2ML IJ SOLN
50.0000 ug | Freq: Once | INTRAMUSCULAR | Status: AC
  Administered 2020-08-15: 50 ug via INTRAVENOUS
  Filled 2020-08-15: qty 2

## 2020-08-15 MED ORDER — CEPHALEXIN 500 MG PO CAPS: 500.0000 mg | ORAL_CAPSULE | Freq: Four times a day (QID) | ORAL | 0 refills | Status: DC

## 2020-08-15 MED ORDER — SULFAMETHOXAZOLE-TRIMETHOPRIM 800-160 MG PO TABS: 1.0000 | ORAL_TABLET | Freq: Two times a day (BID) | ORAL | 0 refills | Status: AC

## 2020-08-15 MED ORDER — FENTANYL CITRATE (PF) 100 MCG/2ML IJ SOLN
50.0000 ug | Freq: Once | INTRAMUSCULAR | Status: AC
  Administered 2020-08-15: 50 ug via INTRAVENOUS

## 2020-08-15 MED ORDER — SODIUM CHLORIDE 0.9 % IV SOLN
1.0000 g | Freq: Once | INTRAVENOUS | Status: AC
  Administered 2020-08-15: 1 g via INTRAVENOUS
  Filled 2020-08-15: qty 10

## 2020-08-15 MED ORDER — SODIUM CHLORIDE 0.9 % IV BOLUS
1000.0000 mL | Freq: Once | INTRAVENOUS | Status: AC
  Administered 2020-08-15: 1000 mL via INTRAVENOUS

## 2020-08-15 NOTE — ED Provider Notes (Signed)
Emergency Department Provider Note  ____________________________________________  Time seen: Approximately 3:57 PM  I have reviewed the triage vital signs and the nursing notes.   HISTORY  Chief Complaint Back Pain   Historian Patient    HPI Meghan Jarvis is a 44 y.o. female with a history of HIV, presents to the emergency right-sided low back pain that has occurred for the past 3 days.  Patient reports that her current pain feels similar cholelithiasis past.  Review of the EMR reveals that patient was admitted in May of this year for pyelonephritis.  She denies current dysuria or increased frequency.  She has had chills but no fever at home.  She denies recent falls or mechanisms of trauma.  No numbness or tingling radiating down the lower extremities.  No chest pain, chest tightness or abdominal pain.  Patient states that she has been nauseated.   Past Medical History:  Diagnosis Date   Asthma    Depression    HIV (human immunodeficiency virus infection) (HCC)      Immunizations up to date:  Yes.     Past Medical History:  Diagnosis Date   Asthma    Depression    HIV (human immunodeficiency virus infection) [facility])     Patient Active Problem List   Diagnosis Date Noted   Atrial fibrillation, new onset (HCC) 05/11/2020   Pyelonephritis 05/09/2020   HIV (human immunodeficiency virus infection) (HCC)    Alcohol use disorder, severe, dependence (HCC) 05/18/2016   Alcohol abuse 05/17/2016    Past Surgical History:  Procedure Laterality Date   ABDOMINAL HYSTERECTOMY     APPENDECTOMY      Prior to Admission medications   Medication Sig Start Date End Date Taking? Authorizing Provider  bictegravir-emtricitabine-tenofovir AF (BIKTARVY) 50-200-25 MG TABS tablet Take 1 tablet by mouth daily.    [provider]  cephALEXin (KEFLEX) 500 MG capsule Take 1 capsule (500 mg total) by mouth 4 (four) times daily for 7 days. 08/15/20 08/22/20  Orvil Feil, PA-C  DULoxetine (CYMBALTA) 30 MG capsule Take 90 mg by mouth daily.    [provider]  gabapentin (NEURONTIN) 300 MG capsule Take 300 mg by mouth 3 (three) times daily.    [provider]  polycarbophil (FIBERCON) 625 MG tablet Take 625 mg by mouth daily.    [provider]    Allergies Shellfish allergy, Buprenorphine hcl, and Morphine and related  Family History  Family history unknown: Yes    Social History Social History   Tobacco Use   Smoking status: Current Every Day Smoker    Packs/day: 1.00    Types: Cigarettes   Smokeless tobacco: Never Used  Substance Use Topics   Alcohol use: Yes   Drug use: No     Review of Systems  Constitutional: No fever/chills Eyes:  No discharge ENT: No upper respiratory complaints. Respiratory: no cough. No SOB/ use of accessory muscles to breath Gastrointestinal: Patient has right sided flank pain.  Musculoskeletal: Negative for musculoskeletal pain. Skin: Negative for rash, abrasions, lacerations, ecchymosis.   ____________________________________________   PHYSICAL EXAM:  VITAL SIGNS: ED Triage Vitals  Enc Vitals Group     BP 08/15/20 1436 100/69     Pulse Rate 08/15/20 1436 77     Resp 08/15/20 1436 18     Temp 08/15/20 1436 97.9 F (36.6 C)     Temp Source 08/15/20 1436 Oral     SpO2 08/15/20 1436 99 %  Weight 08/15/20 1437 200 lb (90.7 kg)     Height 08/15/20 1437 5\' 8"  (1.727 m)     Head Circumference --      Peak Flow --      Pain Score --      Pain Loc --      Pain Edu? --      Excl. in GC? --      Constitutional: Alert and oriented. Well appearing and in no acute distress. Eyes: Conjunctivae are normal. PERRL. EOMI. Head: Atraumatic. Cardiovascular: Normal rate, regular rhythm. Normal S1 and S2.  Good peripheral circulation. Respiratory: Normal respiratory effort without tachypnea or retractions. Lungs CTAB. Good air entry to the bases with no decreased or  absent breath sounds Gastrointestinal: Bowel sounds x 4 quadrants. Soft and nontender to palpation. No guarding or rigidity. No distention. Patient has right sided CVA tenderness.  Musculoskeletal: Full range of motion to all extremities. No obvious deformities noted Neurologic:  Normal for age. No gross focal neurologic deficits are appreciated.  Skin:  Skin is warm, dry and intact. No rash noted. Psychiatric: Mood and affect are normal for age. Speech and behavior are normal.   ____________________________________________   LABS (all labs ordered are listed, but only abnormal results are displayed)  Labs Reviewed  URINALYSIS, COMPLETE (UACMP) WITH MICROSCOPIC - Abnormal; Notable for the following components:      Result Value   Color, Urine YELLOW (*)    APPearance HAZY (*)    Leukocytes,Ua LARGE (*)    WBC, UA >50 (*)    Bacteria, UA MANY (*)    All other components within normal limits  CBC WITH DIFFERENTIAL/PLATELET - Abnormal; Notable for the following components:   Hemoglobin 11.5 (*)    HCT 35.5 (*)    RDW 16.7 (*)    Neutro Abs 1.5 (*)    All other components within normal limits  COMPREHENSIVE METABOLIC PANEL - Abnormal; Notable for the following components:   Glucose, Bld 112 (*)    Total Protein 8.6 (*)    All other components within normal limits  URINE CULTURE  POCT PREGNANCY, URINE  POC URINE PREG, ED   ____________________________________________  EKG   ____________________________________________  RADIOLOGY , personally viewed and evaluated these images (plain radiographs) as part of my medical decision making, as well as reviewing the written report by the radiologist.    CT Renal Stone Study  Result Date: 08/15/2020 CLINICAL DATA:  Right-sided flank pain and back pain. EXAM: CT ABDOMEN AND PELVIS WITHOUT CONTRAST TECHNIQUE: Multidetector CT imaging of the abdomen and pelvis was performed following the standard protocol without IV  contrast. COMPARISON:  May 09, 2020 FINDINGS: Lower chest: No acute abnormality. Hepatobiliary: No focal liver abnormality is seen. No gallstones, gallbladder wall thickening, or biliary dilatation. Pancreas: Unremarkable. No pancreatic ductal dilatation or surrounding inflammatory changes. Spleen: Normal in size without focal abnormality. Adrenals/Urinary Tract: Adrenal glands are unremarkable. Kidneys are normal, without renal calculi, focal lesion, or hydronephrosis. Bladder is unremarkable. Stomach/Bowel: Stomach is within normal limits. Post appendectomy. No evidence of bowel wall thickening, distention, or inflammatory changes. Vascular/Lymphatic: No significant vascular findings are present. No enlarged abdominal or pelvic lymph nodes. Reproductive: Status post hysterectomy. No adnexal masses. Other: No abdominal wall hernia. Curvilinear calcified material along the periumbilical anterior abdominal wall likely due to prior umbilical hernia repair. No abdominopelvic ascites. Musculoskeletal: No acute or significant osseous findings. IMPRESSION: No evidence of acute abnormalities within the abdomen or pelvis. Electronically Signed  By: Ted Mcalpine M.D.   On: 08/15/2020 18:02    ____________________________________________    PROCEDURES  Procedure(s) performed:     Procedures     Medications  ondansetron (ZOFRAN-ODT) disintegrating tablet 4 mg (4 mg Oral Given 08/15/20 1617)  sodium chloride 0.9 % bolus 1,000 mL (1,000 mLs Intravenous New Bag/Given 08/15/20 1618)  fentaNYL (SUBLIMAZE) injection 50 mcg (50 mcg Intravenous Given 08/15/20 1617)  cefTRIAXone (ROCEPHIN) 1 g in sodium chloride 0.9 % 100 mL IVPB (1 g Intravenous New Bag/Given 08/15/20 1759)  fentaNYL (SUBLIMAZE) injection 50 mcg (50 mcg Intravenous Given 08/15/20 1755)     ____________________________________________   INITIAL IMPRESSION / ASSESSMENT AND PLAN / ED COURSE  Pertinent labs & imaging results that were  available during my care of the patient were reviewed by me and considered in my medical decision making (see chart for details).      Assessment and Plan: Flank pain:  Pyelonephritis 44 year old female with a history of HIV presents to the emergency department with right-sided flank pain for the past 3 days.  Patient was mildly hypertensive at triage but vital signs were otherwise reassuring.  On physical exam, she had right-sided CVA tenderness.  Differential diagnosis includes nephrolithiasis, pyelonephritis, cystitis, muscle spasm...  Urinalysis revealed a large amount of leukocytes with many bacteria concerning for cystitis.  CVA tenderness with nausea increases suspicion for pyelonephritis.  No leukocytosis which is to be expected with patient's HIV status.  No evidence of nephrolithiasis on CT renal stone study.  Patient's vital signs remained reassuring throughout emergency department course.  Patient was given IV Rocephin in the emergency department and discharged with Bactrim to be taken twice daily for the next 10 days.  She was given strict return precautions to return to the emergency department with new or worsening symptoms.  All patient questions were answered.  ____________________________________________  FINAL CLINICAL IMPRESSION(S) / ED DIAGNOSES  Final diagnoses:  Pyelonephritis      NEW MEDICATIONS STARTED DURING THIS VISIT:  ED Discharge Orders         Ordered    cephALEXin (KEFLEX) 500 MG capsule  4 times daily        08/15/20 1823              This chart was dictated using voice recognition software/Dragon. Despite best efforts to proofread, errors can occur which can change the meaning. Any change was purely unintentional.     Orvil Feil, PA-C 08/15/20 1851    Delton Prairie, MD 08/15/20 [phone]

## 2020-08-15 NOTE — Discharge Instructions (Addendum)
Take Bactrim twice daily for the next ten days.

## 2020-08-15 NOTE — ED Notes (Signed)
Patient is complaining of lower abdominal pain and lower back pain x 4 days.  Patient reports vomiting and diarrhea.

## 2020-08-15 NOTE — ED Triage Notes (Signed)
Pt reports right side and back pain pain, states that it has been going on for the past 3 days and seems to be getting worse, denies injury and denies pain with urination

## 2020-08-15 NOTE — ED Notes (Signed)
Pt to CT, rocephin ready for when pt returns

## 2020-08-17 LAB — URINE CULTURE: Culture: >=100000 — AB

## 2020-08-23 ENCOUNTER — Ambulatory Visit: Payer: Self-pay

## 2020-12-29 ENCOUNTER — Ambulatory Visit: Payer: Self-pay

## 2020-12-30 ENCOUNTER — Ambulatory Visit: Payer: Self-pay

## 2021-01-27 ENCOUNTER — Emergency Department
Admission: EM | Admit: 2021-01-27 | Discharge: 2021-01-27 | Disposition: A | Discharge status: Home / Self Care | Payer: Medicaid Other | Attending: Emergency Medicine | Admitting: Emergency Medicine

## 2021-01-27 ENCOUNTER — Emergency Department: Payer: Medicaid Other

## 2021-01-27 ENCOUNTER — Other Ambulatory Visit: Payer: Self-pay

## 2021-01-27 DIAGNOSIS — F1490 Cocaine use, unspecified, uncomplicated: Secondary | ICD-10-CM | POA: Insufficient documentation

## 2021-01-27 DIAGNOSIS — M5442 Lumbago with sciatica, left side: Secondary | ICD-10-CM | POA: Insufficient documentation

## 2021-01-27 DIAGNOSIS — M5441 Lumbago with sciatica, right side: Secondary | ICD-10-CM | POA: Insufficient documentation

## 2021-01-27 DIAGNOSIS — Z20822 Contact with and (suspected) exposure to covid-19: Secondary | ICD-10-CM | POA: Insufficient documentation

## 2021-01-27 DIAGNOSIS — J189 Pneumonia, unspecified organism: Secondary | ICD-10-CM | POA: Insufficient documentation

## 2021-01-27 DIAGNOSIS — J45909 Unspecified asthma, uncomplicated: Secondary | ICD-10-CM | POA: Diagnosis not present

## 2021-01-27 DIAGNOSIS — R0602 Shortness of breath: Secondary | ICD-10-CM

## 2021-01-27 DIAGNOSIS — F17210 Nicotine dependence, cigarettes, uncomplicated: Secondary | ICD-10-CM | POA: Diagnosis not present

## 2021-01-27 DIAGNOSIS — Z21 Asymptomatic human immunodeficiency virus [HIV] infection status: Secondary | ICD-10-CM | POA: Insufficient documentation

## 2021-01-27 DIAGNOSIS — M545 Low back pain, unspecified: Secondary | ICD-10-CM | POA: Diagnosis present

## 2021-01-27 DIAGNOSIS — R079 Chest pain, unspecified: Secondary | ICD-10-CM

## 2021-01-27 LAB — CBC WITH DIFFERENTIAL/PLATELET
Abs Immature Granulocytes: 0.01 10*3/uL (ref 0.00–0.07)
Basophils Absolute: 0 10*3/uL (ref 0.0–0.1)
Basophils Relative: 1 %
Eosinophils Absolute: 0.1 10*3/uL (ref 0.0–0.5)
Eosinophils Relative: 3 %
HCT: 36.4 % (ref 36.0–46.0)
Hemoglobin: 11.6 g/dL — ABNORMAL LOW (ref 12.0–15.0)
Immature Granulocytes: 0 %
Lymphocytes Relative: 34 %
Lymphs Abs: 1.5 10*3/uL (ref 0.7–4.0)
MCH: 26.5 pg (ref 26.0–34.0)
MCHC: 31.9 g/dL (ref 30.0–36.0)
MCV: 83.3 fL (ref 80.0–100.0)
Monocytes Absolute: 0.4 10*3/uL (ref 0.1–1.0)
Monocytes Relative: 9 %
Neutro Abs: 2.3 10*3/uL (ref 1.7–7.7)
Neutrophils Relative %: 53 %
Platelets: 207 10*3/uL (ref 150–400)
RBC: 4.37 MIL/uL (ref 3.87–5.11)
RDW: 15.2 % (ref 11.5–15.5)
Smear Review: NORMAL
WBC: 4.3 10*3/uL (ref 4.0–10.5)
nRBC: 0 % (ref 0.0–0.2)

## 2021-01-27 LAB — URINALYSIS, COMPLETE (UACMP) WITH MICROSCOPIC
Bilirubin Urine: NEGATIVE
Glucose, UA: NEGATIVE mg/dL
Hgb urine dipstick: NEGATIVE
Ketones, ur: NEGATIVE mg/dL
Leukocytes,Ua: NEGATIVE
Nitrite: NEGATIVE
Protein, ur: NEGATIVE mg/dL
Specific Gravity, Urine: 1.023 (ref 1.005–1.030)
pH: 5 (ref 5.0–8.0)

## 2021-01-27 LAB — LACTIC ACID, PLASMA
Lactic Acid, Venous: 1.4 mmol/L (ref 0.5–1.9)
Lactic Acid, Venous: 1.6 mmol/L (ref 0.5–1.9)

## 2021-01-27 LAB — URINE DRUG SCREEN, QUALITATIVE (ARMC ONLY)
Amphetamines, Ur Screen: NOT DETECTED
Barbiturates, Ur Screen: NOT DETECTED
Benzodiazepine, Ur Scrn: NOT DETECTED
Cannabinoid 50 Ng, Ur ~~LOC~~: NOT DETECTED
Cocaine Metabolite,Ur ~~LOC~~: POSITIVE — AB
MDMA (Ecstasy)Ur Screen: NOT DETECTED
Methadone Scn, Ur: NOT DETECTED
Opiate, Ur Screen: NOT DETECTED
Phencyclidine (PCP) Ur S: NOT DETECTED
Tricyclic, Ur Screen: NOT DETECTED

## 2021-01-27 LAB — COMPREHENSIVE METABOLIC PANEL
ALT: 16 U/L (ref 0–44)
AST: 23 U/L (ref 15–41)
Albumin: 3.3 g/dL — ABNORMAL LOW (ref 3.5–5.0)
Alkaline Phosphatase: 70 U/L (ref 38–126)
Anion gap: 8 (ref 5–15)
BUN: 8 mg/dL (ref 6–20)
CO2: 24 mmol/L (ref 22–32)
Calcium: 8.7 mg/dL — ABNORMAL LOW (ref 8.9–10.3)
Chloride: 106 mmol/L (ref 98–111)
Creatinine, Ser: 0.73 mg/dL (ref 0.44–1.00)
GFR, Estimated: >60 mL/min (ref >60)
Glucose, Bld: 94 mg/dL (ref 70–99)
Potassium: 4 mmol/L (ref 3.5–5.1)
Sodium: 138 mmol/L (ref 135–145)
Total Bilirubin: 0.5 mg/dL (ref 0.3–1.2)
Total Protein: 8.1 g/dL (ref 6.5–8.1)

## 2021-01-27 LAB — TROPONIN I (HIGH SENSITIVITY)
Troponin I (High Sensitivity): 3 ng/L (ref <18)
Troponin I (High Sensitivity): 3 ng/L (ref <18)

## 2021-01-27 LAB — SARS CORONAVIRUS 2 BY RT PCR (HOSPITAL ORDER, PERFORMED IN ~~LOC~~ HOSPITAL LAB): SARS Coronavirus 2: NEGATIVE

## 2021-01-27 LAB — BRAIN NATRIURETIC PEPTIDE: B Natriuretic Peptide: 22.9 pg/mL (ref 0.0–100.0)

## 2021-01-27 LAB — LIPASE, BLOOD: Lipase: 35 U/L (ref 11–51)

## 2021-01-27 MED ORDER — SODIUM CHLORIDE 0.9 % IV SOLN
500.0000 mg | Freq: Once | INTRAVENOUS | Status: AC
  Administered 2021-01-27: 500 mg via INTRAVENOUS
  Filled 2021-01-27: qty 500

## 2021-01-27 MED ORDER — OXYCODONE-ACETAMINOPHEN 5-325 MG PO TABS
1.0000 | ORAL_TABLET | Freq: Once | ORAL | Status: AC
  Administered 2021-01-27: 1 via ORAL
  Filled 2021-01-27: qty 1

## 2021-01-27 MED ORDER — AZITHROMYCIN 250 MG PO TABS: ORAL_TABLET | ORAL | 0 refills | Status: AC

## 2021-01-27 MED ORDER — DEXAMETHASONE SODIUM PHOSPHATE 10 MG/ML IJ SOLN
10.0000 mg | Freq: Once | INTRAMUSCULAR | Status: AC
  Administered 2021-01-27: 10 mg via INTRAVENOUS
  Filled 2021-01-27: qty 1

## 2021-01-27 MED ORDER — GADOBUTROL 1 MMOL/ML IV SOLN
7.0000 mL | Freq: Once | INTRAVENOUS | Status: AC | PRN
  Administered 2021-01-27: 7 mL via INTRAVENOUS
  Filled 2021-01-27: qty 7.5

## 2021-01-27 MED ORDER — ONDANSETRON 4 MG PO TBDP
4.0000 mg | ORAL_TABLET | Freq: Once | ORAL | Status: AC
  Administered 2021-01-27: 4 mg via ORAL
  Filled 2021-01-27: qty 1

## 2021-01-27 MED ORDER — AMOXICILLIN-POT CLAVULANATE 875-125 MG PO TABS: 1.0000 | ORAL_TABLET | Freq: Two times a day (BID) | ORAL | 0 refills | Status: AC

## 2021-01-27 MED ORDER — AMOXICILLIN-POT CLAVULANATE 875-125 MG PO TABS
1.0000 | ORAL_TABLET | Freq: Once | ORAL | Status: AC
  Administered 2021-01-27: 1 via ORAL
  Filled 2021-01-27: qty 1

## 2021-01-27 MED ORDER — PREDNISONE 10 MG PO TABS: ORAL_TABLET | ORAL | 0 refills | Status: AC

## 2021-01-27 MED ORDER — SODIUM CHLORIDE 0.9 % IV BOLUS
1000.0000 mL | Freq: Once | INTRAVENOUS | Status: AC
  Administered 2021-01-27: 1000 mL via INTRAVENOUS

## 2021-01-27 MED ORDER — FENTANYL CITRATE (PF) 100 MCG/2ML IJ SOLN
50.0000 ug | Freq: Once | INTRAMUSCULAR | Status: AC
  Administered 2021-01-27: 50 ug via INTRAVENOUS
  Filled 2021-01-27: qty 2

## 2021-01-27 NOTE — ED Notes (Signed)
Pt ambulated to the bathroom.  

## 2021-01-27 NOTE — ED Notes (Signed)
Discharge instructions reviewed with pt and support person . Pt calm , collective upon discharge

## 2021-01-27 NOTE — Discharge Instructions (Addendum)
Take antibiotics and steroids as prescribed. Return for any worsening.

## 2021-01-27 NOTE — ED Notes (Signed)
Pt speaking with MRI.

## 2021-01-27 NOTE — ED Notes (Signed)
Lab will run Lipase that has been added

## 2021-01-27 NOTE — ED Notes (Signed)
Pt reports nausea. Will speak with provider about medication.

## 2021-01-27 NOTE — ED Triage Notes (Signed)
Pt states lower back pain x more than a year, and has gotten worse this month. Pt states it hurts to walk. Pt states tingling in toes x more than a year. Pt in NAD at this time.

## 2021-01-27 NOTE — ED Notes (Signed)
Unable to obtain lab work from IV obtained. Lab called to collect blood specimens.

## 2021-01-27 NOTE — ED Notes (Signed)
Lab at bedside

## 2021-01-28 NOTE — ED Provider Notes (Signed)
[facility] Emergency Department Provider Note  ____________________________________________   Event Date/Time   First MD Initiated Contact with Patient 01/27/21 1551     (approximate)  I have reviewed the triage vital signs and the nursing notes.   HISTORY  Chief Complaint Back Pain  HPI Meghan Jarvis is a 45 y.o. female with past medical history significant for alcohol abuse, HIV, cocaine use who reports to the emergency department for evaluation of acute on chronic low back pain with new onset paresthesias.  She reports that for the last 3 weeks, she has been experiencing fever, chills, shortness of breath with cough, intermittent chest pain with dizziness and severe back pain.  She reports difficulty with ambulation now secondary to pain and paresthesias.  She denies loss of bowel or bladder control.  She denies exposure to other sick contacts, no one else in her home is sick.  She rates her pain a 10/10, has not tried any alleviating measures.        Past Medical History:  Diagnosis Date  . Asthma   . Depression   . HIV (human immunodeficiency virus infection) [facility])     Patient Active Problem List   Diagnosis Date Noted  . Atrial fibrillation, new onset (HCC) 05/11/2020  . Pyelonephritis 05/09/2020  . HIV (human immunodeficiency virus infection) (HCC)   . Alcohol use disorder, severe, dependence (HCC) 05/18/2016  . Alcohol abuse 05/17/2016    Past Surgical History:  Procedure Laterality Date  . ABDOMINAL HYSTERECTOMY    . APPENDECTOMY      Prior to Admission medications   Medication Sig Start Date End Date Taking? Authorizing Provider  amoxicillin-clavulanate (AUGMENTIN) 875-125 MG tablet Take 1 tablet by mouth 2 (two) times daily for 7 days. 01/27/21 02/03/21 Yes Senya Hinzman, Ruben Gottron, PA  azithromycin (ZITHROMAX Z-PAK) 250 MG tablet Take 2 tablets (500 mg) on  Day 1,  followed by 1 tablet (250 mg) once daily on Days 2 through 5. 01/27/21  02/01/21 Yes Annalaura Sauseda, Ruben Gottron, PA  bictegravir-emtricitabine-tenofovir AF (BIKTARVY) 50-200-25 MG TABS tablet Take 1 tablet by mouth daily.   Yes [provider]  DULoxetine (CYMBALTA) 30 MG capsule Take 90 mg by mouth daily.   Yes [provider]  gabapentin (NEURONTIN) 300 MG capsule Take 300 mg by mouth 3 (three) times daily.   Yes [provider]  predniSONE (DELTASONE) 10 MG tablet Take 6 tablets (60 mg total) by mouth daily for 1 day, THEN 5 tablets (50 mg total) daily for 1 day, THEN 4 tablets (40 mg total) daily for 1 day, THEN 3 tablets (30 mg total) daily for 1 day, THEN 2 tablets (20 mg total) daily for 1 day, THEN 1 tablet (10 mg total) daily for 1 day. 01/27/21 02/02/21 Yes Emillia Weatherly, Ruben Gottron, PA  polycarbophil (FIBERCON) 625 MG tablet Take 625 mg by mouth daily.    [provider]    Allergies Shellfish allergy, Buprenorphine hcl, and Morphine and related  Family History  Family history unknown: Yes    Social History Social History   Tobacco Use  . Smoking status: Current Every Day Smoker    Packs/day: 1.00    Types: Cigarettes  . Smokeless tobacco: Never Used  Substance Use Topics  . Alcohol use: Yes  . Drug use: No    Review of Systems Constitutional: + fever/chills Eyes: No visual changes. ENT: No sore throat. Cardiovascular: + chest pain. Respiratory: + shortness of breath. Gastrointestinal: No abdominal pain.  No nausea, no vomiting.  No diarrhea.  No constipation. Genitourinary: Negative for dysuria. Musculoskeletal: + for back pain. Skin: Negative for rash. Neurological: Negative for headaches, focal weakness or numbness.  ____________________________________________   PHYSICAL EXAM:  VITAL SIGNS: ED Triage Vitals  Enc Vitals Group     BP 01/27/21 1505 (!) 87/55     Pulse Rate 01/27/21 1505 (!) 48     Resp 01/27/21 1505 18     Temp 01/27/21 1505 99.1 F (37.3 C)     Temp Source 01/27/21 1505 Oral     SpO2  01/27/21 1505 94 %     Weight 01/27/21 1509 160 lb (72.6 kg)     Height 01/27/21 1509 5\' 8"  (1.727 m)     Head Circumference --      Peak Flow --      Pain Score 01/27/21 1509 10     Pain Loc --      Pain Edu? --      Excl. in GC? --    Constitutional: Alert and oriented. Well appearing and in no acute distress. Eyes: Conjunctivae are normal. PERRL. EOMI. Head: Atraumatic. Nose: No congestion/rhinnorhea. Mouth/Throat: Mucous membranes are moist.  Neck: No stridor.   Cardiovascular: Normal rate, regular rhythm. Grossly normal heart sounds.  Good peripheral circulation. Respiratory: Normal respiratory effort.  No retractions. Lungs CTAB. Gastrointestinal: Soft and nontender. No distention. No abdominal bruits. No CVA tenderness. Musculoskeletal: There is exquisite tenderness to palpation of the midline of the lumbar spine, causing the patient to jump due to pain.  She maintains 5/5 strength in bilateral dorsiflexion and plantarflexion though that causes increase in patient's pain.  She is able to move her hip and knees, though it increases her pain.  No identifiable motor weakness. Neurologic:  Normal speech and language. No gross focal neurologic deficits are appreciated.  Gait not assessed secondary to pain. Skin:  Skin is warm, dry and intact. No rash noted. Psychiatric: Mood and affect are normal. Speech and behavior are normal.  ____________________________________________   LABS (all labs ordered are listed, but only abnormal results are displayed)  Labs Reviewed  COMPREHENSIVE METABOLIC PANEL - Abnormal; Notable for the following components:      Result Value   Calcium 8.7 (*)    Albumin 3.3 (*)    All other components within normal limits  CBC WITH DIFFERENTIAL/PLATELET - Abnormal; Notable for the following components:   Hemoglobin 11.6 (*)    All other components within normal limits  URINALYSIS, COMPLETE (UACMP) WITH MICROSCOPIC - Abnormal; Notable for the following  components:   Color, Urine YELLOW (*)    APPearance CLEAR (*)    Bacteria, UA RARE (*)    All other components within normal limits  URINE DRUG SCREEN, QUALITATIVE ([facility] ONLY) - Abnormal; Notable for the following components:   Cocaine Metabolite,Ur [place] POSITIVE (*)    All other components within normal limits  SARS CORONAVIRUS 2 (HOSPITAL ORDER, PERFORMED IN [facility])  CULTURE, BLOOD (ROUTINE X 2)  CULTURE, BLOOD (ROUTINE X 2)  URINE CULTURE  LACTIC ACID, PLASMA  LACTIC ACID, PLASMA  BRAIN NATRIURETIC PEPTIDE  LIPASE, BLOOD  POC URINE PREG, ED  TROPONIN I (HIGH SENSITIVITY)  TROPONIN I (HIGH SENSITIVITY)   ____________________________________________  EKG  Initial EKG shows normal sinus rhythm with a rate of 90 bpm; no ST elevations or depressions, no T wave changes, no evidence of acute ischemia.  Repeat EKG was obtained during episode of acute 10/10 chest  pain and did not reveal any significant changes. ____________________________________________  RADIOLOGY Chest x-ray reveals patchy infiltrate in the right lower lung base  Official radiology report(s): MR Lumbar Spine W Wo Contrast  Result Date: 01/27/2021 CLINICAL DATA:  Low back pain.  Infection suspected. EXAM: MRI LUMBAR SPINE WITHOUT AND WITH CONTRAST TECHNIQUE: Multiplanar and multiecho pulse sequences of the lumbar spine were obtained without and with intravenous contrast. CONTRAST:  59mL GADAVIST GADOBUTROL 1 MMOL/ML IV SOLN COMPARISON:  CT 08/15/2020 FINDINGS: Segmentation:  5 lumbar type vertebral bodies. Alignment:  No malalignment. Vertebrae:  No fracture or primary bone lesion. Conus medullaris and cauda equina: Conus extends to the L1 level. Conus and cauda equina appear normal. Paraspinal and other soft tissues: Negative Disc levels: No disc level abnormality at L2-3 or above. L3-4: Mild bulging of the disc. Mild facet and ligamentous hypertrophy. No compressive stenosis. L4-5: Moderate bulging of  the disc. Bilateral facet arthropathy with small joint effusions, edema and enhancement. Stenosis of both lateral recesses that could possibly affect either L5 nerve. The facet arthropathy itself could be a cause of back pain or referred facet syndrome pain on either side. L5-S1: Minimal disc bulge. Mild facet hypertrophy without edema or enhancement. No compressive stenosis. IMPRESSION: 1. No evidence of spinal infection. 2. L4-5: Bilateral facet arthropathy with joint effusions, edema and enhancement. Moderate bulging of the disc. Stenosis of both lateral recesses that could affect either or both L5 nerves. The facet arthropathy itself could be a cause of back pain or referred facet syndrome pain on either side. 3. Mild non-compressive degenerative changes at L3-4 and L5-S1. Electronically Signed   By: Paulina Fusi M.D.   On: 01/27/2021 17:37   DG Chest Portable 1 View  Result Date: 01/27/2021 CLINICAL DATA:  Cough, fever, shortness of breath. EXAM: PORTABLE CHEST 1 VIEW COMPARISON:  10/19/2017. FINDINGS: Mediastinum hilar structures normal. Heart size normal. Low lung volumes. Mild right base infiltrate cannot be excluded. No pleural effusion or pneumothorax. No acute bony abnormality. IMPRESSION: Low lung volumes. Mild right base infiltrate cannot be excluded. Electronically Signed   By: Maisie Fus  Register   On: 01/27/2021 16:21    ____________________________________________   INITIAL IMPRESSION / ASSESSMENT AND PLAN / ED COURSE  As part of my medical decision making, I reviewed the following data within the electronic MEDICAL RECORD NUMBER Nursing notes reviewed and incorporated, Labs reviewed, Radiograph reviewed, Notes from prior ED visits and [place] Controlled Substance Database; case discussed at length with Dr. Vicente Males        Patient is a 45 year old female who presents to the emergency department for evaluation of 10/10 back pain in the setting of new paresthesias with fever.  See HPI for further  details.  In triage, the patient has an initial temperature of 99.1 and is hypotensive at 87/55.  Her O2 sats are 94.  Following this, her vitals did improve and she was no longer hypotensive even prior to initial saline bolus.  Given concern for fever and her high risk for back infection given her HIV status, MRI with and without contrast was ordered of the lumbar spine in addition to urinalysis, CBC, CMP, lactic, blood cultures, troponin, lipase, BNP.  UDS was also obtained given patient's known history of cocaine use though she denied that today.  CBC does not reveal any leukocytosis, CMP grossly within normal limits.  BMP and lipase normal.  Lactic within normal limits.  Cultures are pending.  Chest x-ray does reveal some possible infiltrate in the  right lower lung base, and thus Covid swab was obtained to rule out this as a cause of patient's shortness of breath.  Covid is negative.  MR does not reveal any acute infection as cause of patient's pain.  It does reveal some moderate disc bulging at L5 with edema and stenosis at that level.  Given the patient's exam, this could be the cause of her back pain as well as pneumonia for cause of her shortness of breath.  Given that her UDS is positive for cocaine, her troponin is within normal limits, and EKG is normal, suspect cocaine use for the cause of the patient's chest pain.  Will initiate antibiotics for community-acquired pneumonia, steroid for low back pain.  Advised close outpatient follow-up or return to ER for any worsening.  Patient amenable with this plan she stable at time for outpatient therapy.     ____________________________________________   FINAL CLINICAL IMPRESSION(S) / ED DIAGNOSES  Final diagnoses:  Community acquired pneumonia of right lower lobe of lung  Acute midline low back pain with bilateral sciatica  Chest pain, unspecified type  Shortness of breath  Cocaine use     ED Discharge Orders         Ordered    azithromycin  (ZITHROMAX Z-PAK) 250 MG tablet        01/27/21 2004    amoxicillin-clavulanate (AUGMENTIN) 875-125 MG tablet  2 times daily        01/27/21 2004    predniSONE (DELTASONE) 10 MG tablet        01/27/21 2004          *Please note:  Meghan Jarvis was evaluated in Emergency Department on 01/28/2021 for the symptoms described in the history of present illness. She was evaluated in the context of the global COVID-19 pandemic, which necessitated consideration that the patient might be at risk for infection with the SARS-CoV-2 virus that causes COVID-19. Institutional protocols and algorithms that pertain to the evaluation of patients at risk for COVID-19 are in a state of rapid change based on information released by regulatory bodies including the CDC and federal and state organizations. These policies and algorithms were followed during the patient's care in the ED.  Some ED evaluations and interventions may be delayed as a result of limited staffing during and the pandemic.*   Note:  This document was prepared using Dragon voice recognition software and may include unintentional dictation errors.   Lucy ChrisRodgers, Alizandra Loh J, PA 01/28/21 0144    Merwyn KatosBradler, Evan K, MD 01/28/21 [phone]

## 2021-01-29 LAB — URINE CULTURE: Culture: <10000 — AB

## 2021-02-01 LAB — CULTURE, BLOOD (ROUTINE X 2)
Culture: NO GROWTH
Culture: NO GROWTH
Special Requests: ADEQUATE
Special Requests: ADEQUATE

## 2021-03-22 ENCOUNTER — Emergency Department
Admission: EM | Admit: 2021-03-22 | Discharge: 2021-03-22 | Disposition: A | Discharge status: Home / Self Care | Payer: Medicaid Other | Attending: Emergency Medicine | Admitting: Emergency Medicine

## 2021-03-22 ENCOUNTER — Other Ambulatory Visit: Payer: Self-pay

## 2021-03-22 DIAGNOSIS — Z21 Asymptomatic human immunodeficiency virus [HIV] infection status: Secondary | ICD-10-CM | POA: Insufficient documentation

## 2021-03-22 DIAGNOSIS — M545 Low back pain, unspecified: Secondary | ICD-10-CM | POA: Insufficient documentation

## 2021-03-22 DIAGNOSIS — F17210 Nicotine dependence, cigarettes, uncomplicated: Secondary | ICD-10-CM | POA: Diagnosis not present

## 2021-03-22 DIAGNOSIS — J45909 Unspecified asthma, uncomplicated: Secondary | ICD-10-CM | POA: Insufficient documentation

## 2021-03-22 DIAGNOSIS — G8929 Other chronic pain: Secondary | ICD-10-CM | POA: Diagnosis not present

## 2021-03-22 LAB — URINALYSIS, COMPLETE (UACMP) WITH MICROSCOPIC
Bilirubin Urine: NEGATIVE
Glucose, UA: 150 mg/dL — AB
Hgb urine dipstick: NEGATIVE
Ketones, ur: 5 mg/dL — AB
Leukocytes,Ua: NEGATIVE
Nitrite: NEGATIVE
Protein, ur: NEGATIVE mg/dL
Specific Gravity, Urine: 1.015 (ref 1.005–1.030)
pH: 5 (ref 5.0–8.0)

## 2021-03-22 LAB — CBG MONITORING, ED: Glucose-Capillary: 144 mg/dL — ABNORMAL HIGH (ref 70–99)

## 2021-03-22 MED ORDER — PREDNISONE 50 MG PO TABS: ORAL_TABLET | ORAL | 0 refills | Status: DC

## 2021-03-22 MED ORDER — KETOROLAC TROMETHAMINE 30 MG/ML IJ SOLN
30.0000 mg | Freq: Once | INTRAMUSCULAR | Status: AC
  Administered 2021-03-22: 30 mg via INTRAMUSCULAR
  Filled 2021-03-22: qty 1

## 2021-03-22 MED ORDER — PREDNISONE 20 MG PO TABS
40.0000 mg | ORAL_TABLET | Freq: Once | ORAL | Status: AC
  Administered 2021-03-22: 40 mg via ORAL
  Filled 2021-03-22: qty 2

## 2021-03-22 NOTE — ED Provider Notes (Signed)
[facility] Emergency Department Provider Note   ____________________________________________   Event Date/Time   First MD Initiated Contact with Patient 03/22/21 1148     (approximate)  I have reviewed the triage vital signs and the nursing notes.   HISTORY  Chief Complaint Back Pain    HPI Meghan Jarvis is a 45 y.o. female history of back pain, previous A. fib and HIV.  Patient reports compliance with HIV regimen and continues to follow outpatient with HIV center.   Patient reports that she has been having lower back pain now for about a year.  She tells me is an ongoing issue.  She reports that will come off and on, and she is come to the ER for previous.  She is never followed up with a specialist for it.  She denies any new symptoms of weakness.  She does get a numbing or shooting pain that runs across the left buttock down to about the level of the back of the left leg.  No cold or blue feet.  No fever.  Denies IV drug use.  She has not tried any medications to alleviate or lessen the pain at this point.  No bowel or bladder changes.  No incontinence.  Past Medical History:  Diagnosis Date  . Asthma   . Depression   . HIV (human immunodeficiency virus infection) [facility])     Patient Active Problem List   Diagnosis Date Noted  . Atrial fibrillation, new onset (HCC) 05/11/2020  . Pyelonephritis 05/09/2020  . HIV (human immunodeficiency virus infection) (HCC)   . Alcohol use disorder, severe, dependence (HCC) 05/18/2016  . Alcohol abuse 05/17/2016    Past Surgical History:  Procedure Laterality Date  . ABDOMINAL HYSTERECTOMY    . APPENDECTOMY      Prior to Admission medications   Medication Sig Start Date End Date Taking? Authorizing Provider  bictegravir-emtricitabine-tenofovir AF (BIKTARVY) 50-200-25 MG TABS tablet Take 1 tablet by mouth daily.    [provider]  DULoxetine (CYMBALTA) 30 MG capsule Take 90 mg by mouth  daily.    [provider]  gabapentin (NEURONTIN) 300 MG capsule Take 300 mg by mouth 3 (three) times daily.    [provider]  polycarbophil (FIBERCON) 625 MG tablet Take 625 mg by mouth daily.    [provider]    Allergies Shellfish allergy, Buprenorphine hcl, and Morphine and related  Family History  Family history unknown: Yes    Social History Social History   Tobacco Use  . Smoking status: Current Every Day Smoker    Packs/day: 1.00    Types: Cigarettes  . Smokeless tobacco: Never Used  Substance Use Topics  . Alcohol use: Yes  . Drug use: No    Review of Systems Constitutional: No fever/chills Cardiovascular: Denies chest pain. Respiratory: Denies shortness of breath. Gastrointestinal: No abdominal pain.   Genitourinary: Negative for dysuria.  Denies pregnancy.  Prior hysterectomy Musculoskeletal: Negative for back pain except in her very low back more on the left side, radiates pain across the buttock.  See HPI. Skin: Negative for rash. Neurological: Negative for headaches or weakness.  Reports sometimes getting a tingling or numbing feeling across the back of the left leg and sometimes into the foot.  Symptoms on and off seems to have been an issue for about a year now  Denies any injury    ____________________________________________   PHYSICAL EXAM:  VITAL SIGNS: ED Triage Vitals  Enc Vitals Group  BP 03/22/21 1146 115/70     Pulse Rate 03/22/21 1149 94     Resp 03/22/21 1146 18     Temp 03/22/21 1146 99.3 F (37.4 C)     Temp Source 03/22/21 1146 Oral     SpO2 03/22/21 1149 97 %     Weight 03/22/21 1148 210 lb (95.3 kg)     Height 03/22/21 1148 5\' 8"  (1.727 m)     Head Circumference --      Peak Flow --      Pain Score 03/22/21 1147 10     Pain Loc --      Pain Edu? --      Excl. in GC? --     Constitutional: Alert and oriented. Well appearing and in no acute distress. Eyes: Conjunctivae are normal. Head:  Atraumatic. Neck: No stridor.  Cardiovascular: Normal rate, regular rhythm. Grossly normal heart sounds.  Good peripheral circulation. Respiratory: Normal respiratory effort.  No retractions. Lungs CTAB. Gastrointestinal: Soft and nontender. No distention. Musculoskeletal: Tenderness to palpation along the left paraspinous region of the lumbar spine lower, no thoracic or cervical tenderness.  No step-offs deformities or lesions to visual inspection.  Lower Extremities  No edema. Normal DP pulses left leg with good cap refill.  Normal neuro-motor function lower extremities bilateral.  RIGHT Right lower extremity demonstrates normal strength, good use of all muscles. No edema bruising or contusions of the right hip, right knee, right ankle. Full range of motion of the right lower extremity without pain. No pain on axial loading. No evidence of trauma.  LEFT Left lower extremity demonstrates normal strength, good use of all muscles. No edema bruising or contusions of the hip,  knee, ankle. Full range of motion of the left lower extremity but does report pain through range of motion. No pain on axial loading. No evidence of trauma.  Warm and well-perfused   Neurologic:  Normal speech and language. No gross focal neurologic deficits are appreciated.  Skin:  Skin is warm, dry and intact. No rash noted. Psychiatric: Mood and affect are normal. Speech and behavior are normal.  ____________________________________________   LABS (all labs ordered are listed, but only abnormal results are displayed)  Labs Reviewed  URINALYSIS, COMPLETE (UACMP) WITH MICROSCOPIC - Abnormal; Notable for the following components:      Result Value   Color, Urine YELLOW (*)    APPearance HAZY (*)    Glucose, UA 150 (*)    Ketones, ur 5 (*)    Bacteria, UA RARE (*)    All other components within normal limits  CBG MONITORING, ED - Abnormal; Notable for the following components:   Glucose-Capillary 144 (*)     All other components within normal limits  URINE CULTURE   ____________________________________________  EKG   ____________________________________________  RADIOLOGY  She has had previous imaging including CT scans within the last year.  No acute musculoskeletal abnormalities of the lumbar spine were noted on those studies.  Given the chronicity of the symptoms I do not believe that repeat imaging in the ED at this point is indicated.  No trauma.  No back pain red flags are noted.  She does have HIV but reports compliance with therapy ____________________________________________   PROCEDURES  Procedure(s) performed: None  Procedures  Critical Care performed: No  ____________________________________________   INITIAL IMPRESSION / ASSESSMENT AND PLAN / ED COURSE  Pertinent labs & imaging results that were available during my care of the patient were reviewed by me  and considered in my medical decision making (see chart for details).   Back pain, left-sided.  Reassuring examination with regard to neurologic and vascular exam.  No red flags.  Discussed with the patient, will trial steroid, Toradol.  I recommended she do follow-up with a spine orthopedic physician.  She is comfortable with that plan.  Clinical Course as of 03/22/21 1301  Tue Mar 22, 2021  1202 Normal vital signs. [MQ]    Clinical Course User Index [MQ] Sharyn Creamer, MD   Discussed plan of care including recommend follow-up with orthopedics.  We discussed careful return precautions.  She will trial brief steroids for relief, and return to the ER if worsening or new concerning symptoms.  She does report good relief of pain and discomfort after receiving Toradol  Return precautions and treatment recommendations and follow-up discussed with the patient who is agreeable with the plan.   ____________________________________________   FINAL CLINICAL IMPRESSION(S) / ED DIAGNOSES  Final diagnoses:  Chronic  left-sided low back pain with left-sided sciatica        Note:  This document was prepared using Dragon voice recognition software and may include unintentional dictation errors       Sharyn Creamer, MD 03/22/21 1644

## 2021-03-22 NOTE — ED Triage Notes (Signed)
Pt c/o low back pain for a few days.

## 2021-03-22 NOTE — ED Notes (Addendum)
See triage note  Presents with lower back pain which is moving into left leg  States she feels like her leg is swollen  Ambulates with slight limp  Denies any injury

## 2021-03-24 LAB — URINE CULTURE: Culture: 10000 — AB

## 2021-06-06 ENCOUNTER — Other Ambulatory Visit: Payer: Self-pay

## 2021-06-06 ENCOUNTER — Encounter: Payer: Self-pay | Admitting: Emergency Medicine

## 2021-06-06 ENCOUNTER — Emergency Department
Admission: EM | Admit: 2021-06-06 | Discharge: 2021-06-06 | Disposition: A | Discharge status: Home / Self Care | Payer: Medicaid Other | Attending: Emergency Medicine | Admitting: Emergency Medicine

## 2021-06-06 DIAGNOSIS — G8929 Other chronic pain: Secondary | ICD-10-CM | POA: Diagnosis not present

## 2021-06-06 DIAGNOSIS — M545 Low back pain, unspecified: Secondary | ICD-10-CM | POA: Diagnosis present

## 2021-06-06 DIAGNOSIS — Z21 Asymptomatic human immunodeficiency virus [HIV] infection status: Secondary | ICD-10-CM | POA: Insufficient documentation

## 2021-06-06 DIAGNOSIS — J45909 Unspecified asthma, uncomplicated: Secondary | ICD-10-CM | POA: Diagnosis not present

## 2021-06-06 DIAGNOSIS — F17210 Nicotine dependence, cigarettes, uncomplicated: Secondary | ICD-10-CM | POA: Insufficient documentation

## 2021-06-06 LAB — URINALYSIS, COMPLETE (UACMP) WITH MICROSCOPIC
Bacteria, UA: NONE SEEN
Bilirubin Urine: NEGATIVE
Glucose, UA: NEGATIVE mg/dL
Hgb urine dipstick: NEGATIVE
Ketones, ur: NEGATIVE mg/dL
Nitrite: NEGATIVE
Protein, ur: NEGATIVE mg/dL
Specific Gravity, Urine: 1.018 (ref 1.005–1.030)
pH: 5 (ref 5.0–8.0)

## 2021-06-06 NOTE — ED Notes (Signed)
Pt used call bell and this RN checked on pt, pt stated "I need to be discharged so I can catch the bus" PA aware, pt states she is going to leave AMA, pt asked to sign AMA form, pt refuses.

## 2021-06-06 NOTE — ED Triage Notes (Signed)
C/O lower back pain.  States pain is ongoing.  Has been seen through ED 3 times for same.  Has not followed up elsewhere.  AAOx3.  Skin warm and dry. NAD

## 2021-06-06 NOTE — ED Notes (Signed)
Pt states that she has already been to the ER twice before for the same pain, pt denies any known injury, states that she is having center lower back pain, reports that it does radiate into her buttocks

## 2021-06-06 NOTE — ED Provider Notes (Signed)
[facility] Emergency Department Provider Note  ____________________________________________   Event Date/Time   First MD Initiated Contact with Patient 06/06/21 1549     (approximate)  I have reviewed the triage vital signs and the nursing notes.   HISTORY  Chief Complaint Back Pain   HPI Meghan Jarvis is a 45 y.o. female with PMH significant for HIV, alcohol dependence, Afib who presents to the ER for evaluation of worsening of chronic back pain. Patient reports being seen here for same in the past. Reports she received a shot last time that helped, but that the pills didn't. She has been using Tylenol for her symptoms but hasn't had enough relief of symptoms. Denies bowel or bladder incontinence, new fevers. Reports pain is worse with ambulation. Denies new trauma or falls, denies nausea, vomiting or abdominal pain.        Past Medical History:  Diagnosis Date   Asthma    Depression    HIV (human immunodeficiency virus infection) [facility])     Patient Active Problem List   Diagnosis Date Noted   Atrial fibrillation, new onset (HCC) 05/11/2020   Pyelonephritis 05/09/2020   HIV (human immunodeficiency virus infection) (HCC)    Alcohol use disorder, severe, dependence (HCC) 05/18/2016   Alcohol abuse 05/17/2016    Past Surgical History:  Procedure Laterality Date   ABDOMINAL HYSTERECTOMY     APPENDECTOMY      Prior to Admission medications   Medication Sig Start Date End Date Taking? Authorizing Provider  bictegravir-emtricitabine-tenofovir AF (BIKTARVY) 50-200-25 MG TABS tablet Take 1 tablet by mouth daily.    [provider]  DULoxetine (CYMBALTA) 30 MG capsule Take 90 mg by mouth daily.    [provider]  gabapentin (NEURONTIN) 300 MG capsule Take 300 mg by mouth 3 (three) times daily.    [provider]  polycarbophil (FIBERCON) 625 MG tablet Take 625 mg by mouth daily.    [provider]  predniSONE  (DELTASONE) 50 MG tablet 1 tab by mouth daily 03/22/21   Sharyn Creamer, MD    Allergies Shellfish allergy, Buprenorphine hcl, and Morphine and related  Family History  Family history unknown: Yes    Social History Social History   Tobacco Use   Smoking status: Every Day    Packs/day: 1.00    Pack years: 0.00    Types: Cigarettes   Smokeless tobacco: Never  Substance Use Topics   Alcohol use: Yes   Drug use: No    Review of Systems Constitutional: No fever/chills Eyes: No visual changes. ENT: No sore throat. Cardiovascular: Denies chest pain. Respiratory: Denies shortness of breath. Gastrointestinal: No abdominal pain.  No nausea, no vomiting.  No diarrhea.  No constipation. Genitourinary: Negative for dysuria. Musculoskeletal: Negative for back pain. Skin: Negative for rash. Neurological: Negative for headaches, focal weakness or numbness.   ____________________________________________   PHYSICAL EXAM:  VITAL SIGNS: ED Triage Vitals  Enc Vitals Group     BP 06/06/21 1458 115/79     Pulse Rate 06/06/21 1458 70     Resp 06/06/21 1458 16     Temp 06/06/21 1458 98.8 F (37.1 C)     Temp Source 06/06/21 1458 Oral     SpO2 06/06/21 1458 98 %     Weight 06/06/21 1457 210 lb 1.6 oz (95.3 kg)     Height 06/06/21 1457 5\' 8"  (1.727 m)     Head Circumference --      Peak Flow --  Pain Score 06/06/21 1457 10     Pain Loc --      Pain Edu? --      Excl. in GC? --    Constitutional: Alert and oriented. Well appearing and in no acute distress. Eyes: Conjunctivae are normal. PERRL. EOMI. Head: Atraumatic. Nose: No congestion/rhinnorhea. Mouth/Throat: Mucous membranes are moist.  Oropharynx non-erythematous. Neck: No stridor.   Cardiovascular: Normal rate, regular rhythm. Grossly normal heart sounds.  Good peripheral circulation. Respiratory: Normal respiratory effort.  No retractions. Lungs CTAB. Gastrointestinal: Soft and nontender. No distention. No abdominal  bruits. No CVA tenderness. Musculoskeletal: TTP of the midline of the superior lumbar spine as well as paraspinal tenderness, worse on left than right. 5/5 motor strength in the bilateral lower extremities. Neurologic:  Normal speech and language. No gross focal neurologic deficits are appreciated. No gait instability. Skin:  Skin is warm, dry and intact. No rash noted. Psychiatric: Mood and affect are normal. Speech and behavior are normal.  ____________________________________________   LABS (all labs ordered are listed, but only abnormal results are displayed)  Labs Reviewed  URINALYSIS, COMPLETE (UACMP) WITH MICROSCOPIC    ____________________________________________   INITIAL IMPRESSION / ASSESSMENT AND PLAN / ED COURSE  As part of my medical decision making, I reviewed the following data within the electronic MEDICAL RECORD NUMBER Nursing notes reviewed and incorporated, Labs reviewed, and Notes from prior ED visits        Patient is a 45 yo F who reports to the ER for evaluation of acute exacerbation of chronic back pain.  No fever, chills, bowel or bladder dysfunction.  She has remained ambulatory but with increased pain.  Denies dysuria.  Treating with Tylenol at home with no improvement in symptoms.  In triage, patient has normal vital signs.  Given pain on exam worse on the left flank and paraspinals, will obtain urinalysis though no distinct pain with CVA percussion.  While awaiting for urine results, patient eloped from the facility due to not wanting to wait for results in order to not miss the bus.  Nursing advised her to return with any worsening.      ____________________________________________   FINAL CLINICAL IMPRESSION(S) / ED DIAGNOSES  Final diagnoses:  Chronic midline low back pain without sciatica     ED Discharge Orders     None        Note:  This document was prepared using Dragon voice recognition software and may include unintentional  dictation errors.    Lucy Chris, PA 06/06/21 1748    Concha Se, MD 06/06/21 [phone]

## 2021-06-07 ENCOUNTER — Emergency Department
Admission: EM | Admit: 2021-06-07 | Discharge: 2021-06-07 | Disposition: A | Discharge status: Home / Self Care | Payer: Medicaid Other | Attending: Emergency Medicine | Admitting: Emergency Medicine

## 2021-06-07 ENCOUNTER — Encounter: Payer: Self-pay | Admitting: Medical Oncology

## 2021-06-07 DIAGNOSIS — F17210 Nicotine dependence, cigarettes, uncomplicated: Secondary | ICD-10-CM | POA: Diagnosis not present

## 2021-06-07 DIAGNOSIS — J45909 Unspecified asthma, uncomplicated: Secondary | ICD-10-CM | POA: Diagnosis not present

## 2021-06-07 DIAGNOSIS — Z79899 Other long term (current) drug therapy: Secondary | ICD-10-CM | POA: Insufficient documentation

## 2021-06-07 DIAGNOSIS — M4716 Other spondylosis with myelopathy, lumbar region: Secondary | ICD-10-CM | POA: Diagnosis not present

## 2021-06-07 DIAGNOSIS — Z21 Asymptomatic human immunodeficiency virus [HIV] infection status: Secondary | ICD-10-CM | POA: Diagnosis not present

## 2021-06-07 DIAGNOSIS — G8929 Other chronic pain: Secondary | ICD-10-CM | POA: Insufficient documentation

## 2021-06-07 DIAGNOSIS — M545 Low back pain, unspecified: Secondary | ICD-10-CM | POA: Diagnosis present

## 2021-06-07 MED ORDER — ORPHENADRINE CITRATE 30 MG/ML IJ SOLN
60.0000 mg | Freq: Two times a day (BID) | INTRAMUSCULAR | Status: DC
  Administered 2021-06-07: 60 mg via INTRAMUSCULAR
  Filled 2021-06-07: qty 2

## 2021-06-07 MED ORDER — ORPHENADRINE CITRATE ER 100 MG PO TB12: 100.0000 mg | ORAL_TABLET | Freq: Two times a day (BID) | ORAL | 0 refills | Status: DC

## 2021-06-07 MED ORDER — METHYLPREDNISOLONE 4 MG PO TBPK: ORAL_TABLET | ORAL | 0 refills | Status: DC

## 2021-06-07 MED ORDER — KETOROLAC TROMETHAMINE 60 MG/2ML IM SOLN
60.0000 mg | Freq: Once | INTRAMUSCULAR | Status: AC
  Administered 2021-06-07: 60 mg via INTRAMUSCULAR
  Filled 2021-06-07: qty 2

## 2021-06-07 NOTE — ED Triage Notes (Signed)
CC of lower back pain for "a while now", pt has been seen for same, pain not improved. Pt denies injury. No f/u care.

## 2021-06-07 NOTE — ED Provider Notes (Signed)
[facility] Emergency Department Provider Note   ____________________________________________   Event Date/Time   First MD Initiated Contact with Patient 06/07/21 1120     (approximate)  I have reviewed the triage vital signs and the nursing notes.   HISTORY  Chief Complaint Back Pain    HPI Meghan Jarvis is a 45 y.o. female patient presents with chronic low back pain.  Patient was at this facility yesterdayand left while pending lab results.  Patient was diagnosed with degenerative changes to lumbar spine in February 2022 via MRI.  Patient didnot follow up orthopedics as directed.  No new provocative incident for complaint.  Past Medical History:  Diagnosis Date   Asthma    Depression    HIV (human immunodeficiency virus infection) [facility])     Patient Active Problem List   Diagnosis Date Noted   Atrial fibrillation, new onset (HCC) 05/11/2020   Pyelonephritis 05/09/2020   HIV (human immunodeficiency virus infection) (HCC)    Alcohol use disorder, severe, dependence (HCC) 05/18/2016   Alcohol abuse 05/17/2016    Past Surgical History:  Procedure Laterality Date   ABDOMINAL HYSTERECTOMY     APPENDECTOMY      Prior to Admission medications   Medication Sig Start Date End Date Taking? Authorizing Provider  methylPREDNISolone (MEDROL DOSEPAK) 4 MG TBPK tablet Take Tapered dose as directed 06/07/21  Yes Joni Reining, PA-C  orphenadrine (NORFLEX) 100 MG tablet Take 1 tablet (100 mg total) by mouth 2 (two) times daily. 06/07/21  Yes Joni Reining, PA-C  bictegravir-emtricitabine-tenofovir AF (BIKTARVY) 50-200-25 MG TABS tablet Take 1 tablet by mouth daily.    [provider]  DULoxetine (CYMBALTA) 30 MG capsule Take 90 mg by mouth daily.    [provider]  gabapentin (NEURONTIN) 300 MG capsule Take 300 mg by mouth 3 (three) times daily.    [provider]  polycarbophil (FIBERCON) 625 MG tablet Take 625 mg by mouth  daily.    [provider]  predniSONE (DELTASONE) 50 MG tablet 1 tab by mouth daily 03/22/21   Sharyn Creamer, MD    Allergies Shellfish allergy, Buprenorphine hcl, and Morphine and related  Family History  Family history unknown: Yes    Social History Social History   Tobacco Use   Smoking status: Every Day    Packs/day: 1.00    Pack years: 0.00    Types: Cigarettes   Smokeless tobacco: Never  Substance Use Topics   Alcohol use: Yes   Drug use: No    Review of Systems  Constitutional: No fever/chills Eyes: No visual changes. ENT: No sore throat. Cardiovascular: Denies chest pain. Respiratory: Denies shortness of breath. Gastrointestinal: No abdominal pain.  No nausea, no vomiting.  No diarrhea.  No constipation. Genitourinary: Negative for dysuria. Musculoskeletal: Positive for back pain. Skin: Negative for rash. Neurological: Negative for headaches, focal weakness or numbness. Psychiatric: Depression.  Alcohol dependency. Allergic/Immunilogical: Shellfish, and morphine.  HIV. ____________________________________________   PHYSICAL EXAM:  VITAL SIGNS: ED Triage Vitals [06/07/21 1021]  Enc Vitals Group     BP 117/85     Pulse Rate 65     Resp 18     Temp 98.9 F (37.2 C)     Temp Source Oral     SpO2 100 %     Weight 209 lb 7 oz (95 kg)     Height 5\' 8"  (1.727 m)     Head Circumference      Peak Flow  Pain Score 10     Pain Loc      Pain Edu?      Excl. in GC?     Constitutional: Alert and oriented. Well appearing and in no acute distress. Cardiovascular: Normal rate, regular rhythm. Grossly normal heart sounds.  Good peripheral circulation. Respiratory: Normal respiratory effort.  No retractions. Lungs CTAB. Gastrointestinal: Soft and nontender. No distention. No abdominal bruits. No CVA tenderness. Genitourinary: Deferred Musculoskeletal: No lower extremity tenderness nor edema.  No joint effusions. Neurologic:  Normal speech and  language. No gross focal neurologic deficits are appreciated. No gait instability. Skin:  Skin is warm, dry and intact. No rash noted. Psychiatric: Mood and affect are normal. Speech and behavior are normal.  ____________________________________________   LABS (all labs ordered are listed, but only abnormal results are displayed)  Labs Reviewed - No data to display ____________________________________________  EKG   ____________________________________________  RADIOLOGY I, Joni Reining, personally viewed and evaluated these images (plain radiographs) as part of my medical decision making, as well as reviewing the written report by the radiologist.  ED MD interpretation:    Official radiology report(s): No results found.  ____________________________________________   PROCEDURES  Procedure(s) performed (including Critical Care):  Procedures   ____________________________________________   INITIAL IMPRESSION / ASSESSMENT AND PLAN / ED COURSE  As part of my medical decision making, I reviewed the following data within the electronic MEDICAL RECORD NUMBER         Patient presents with chronic back pain secondary to degenerative changes of the lumbar spine.  Advised patient she needs to follow-up with orthopedic for definitive evaluation and treatment.  Patient given discharge care instruction and prescription for Norflex and Medrol Dosepak.      ____________________________________________   FINAL CLINICAL IMPRESSION(S) / ED DIAGNOSES  Final diagnoses:  Osteoarthritis of lumbar spine with myelopathy     ED Discharge Orders          Ordered    orphenadrine (NORFLEX) 100 MG tablet  2 times daily        06/07/21 1158    methylPREDNISolone (MEDROL DOSEPAK) 4 MG TBPK tablet        06/07/21 1158             Note:  This document was prepared using Dragon voice recognition software and may include unintentional dictation errors.    Joni Reining,  PA-C 06/07/21 1204    Merwyn Katos, MD 06/07/21 [phone]

## 2021-06-07 NOTE — Discharge Instructions (Signed)
Read and follow discharge care instruction.  Take medication as directed.  Advised to follow orthopedic for definitive evaluation and treatment.

## 2021-08-22 ENCOUNTER — Other Ambulatory Visit: Payer: Self-pay

## 2021-08-22 ENCOUNTER — Emergency Department: Payer: Medicaid Other

## 2021-08-22 ENCOUNTER — Emergency Department
Admission: EM | Admit: 2021-08-22 | Discharge: 2021-08-22 | Disposition: A | Discharge status: Home / Self Care | Payer: Medicaid Other | Attending: Emergency Medicine | Admitting: Emergency Medicine

## 2021-08-22 DIAGNOSIS — M5442 Lumbago with sciatica, left side: Secondary | ICD-10-CM

## 2021-08-22 DIAGNOSIS — Z21 Asymptomatic human immunodeficiency virus [HIV] infection status: Secondary | ICD-10-CM | POA: Diagnosis not present

## 2021-08-22 DIAGNOSIS — M5441 Lumbago with sciatica, right side: Secondary | ICD-10-CM

## 2021-08-22 DIAGNOSIS — F17210 Nicotine dependence, cigarettes, uncomplicated: Secondary | ICD-10-CM | POA: Insufficient documentation

## 2021-08-22 DIAGNOSIS — M545 Low back pain, unspecified: Secondary | ICD-10-CM | POA: Insufficient documentation

## 2021-08-22 DIAGNOSIS — J45909 Unspecified asthma, uncomplicated: Secondary | ICD-10-CM | POA: Diagnosis not present

## 2021-08-22 DIAGNOSIS — R109 Unspecified abdominal pain: Secondary | ICD-10-CM | POA: Insufficient documentation

## 2021-08-22 LAB — CBC
HCT: 37.2 % (ref 36.0–46.0)
Hemoglobin: 12.5 g/dL (ref 12.0–15.0)
MCH: 28.2 pg (ref 26.0–34.0)
MCHC: 33.6 g/dL (ref 30.0–36.0)
MCV: 83.8 fL (ref 80.0–100.0)
Platelets: 214 10*3/uL (ref 150–400)
RBC: 4.44 MIL/uL (ref 3.87–5.11)
RDW: 15.1 % (ref 11.5–15.5)
WBC: 4.2 10*3/uL (ref 4.0–10.5)
nRBC: 0 % (ref 0.0–0.2)

## 2021-08-22 LAB — URINALYSIS, COMPLETE (UACMP) WITH MICROSCOPIC
Bacteria, UA: NONE SEEN
Bilirubin Urine: NEGATIVE
Glucose, UA: NEGATIVE mg/dL
Hgb urine dipstick: NEGATIVE
Ketones, ur: NEGATIVE mg/dL
Leukocytes,Ua: NEGATIVE
Nitrite: NEGATIVE
Protein, ur: NEGATIVE mg/dL
Specific Gravity, Urine: 1.02 (ref 1.005–1.030)
pH: 5 (ref 5.0–8.0)

## 2021-08-22 LAB — BASIC METABOLIC PANEL
Anion gap: 6 (ref 5–15)
BUN: 11 mg/dL (ref 6–20)
CO2: 26 mmol/L (ref 22–32)
Calcium: 8.8 mg/dL — ABNORMAL LOW (ref 8.9–10.3)
Chloride: 107 mmol/L (ref 98–111)
Creatinine, Ser: 0.74 mg/dL (ref 0.44–1.00)
GFR, Estimated: >60 mL/min (ref >60)
Glucose, Bld: 123 mg/dL — ABNORMAL HIGH (ref 70–99)
Potassium: 3.9 mmol/L (ref 3.5–5.1)
Sodium: 139 mmol/L (ref 135–145)

## 2021-08-22 MED ORDER — HYDROCODONE-ACETAMINOPHEN 5-325 MG PO TABS
2.0000 | ORAL_TABLET | Freq: Once | ORAL | Status: AC
Start: 2021-08-22 — End: 2021-08-22
  Administered 2021-08-22: 2 via ORAL
  Filled 2021-08-22: qty 2

## 2021-08-22 MED ORDER — PREDNISONE 10 MG PO TABS: 10.0000 mg | ORAL_TABLET | Freq: Every day | ORAL | 0 refills | Status: DC

## 2021-08-22 MED ORDER — HYDROCODONE-ACETAMINOPHEN 5-325 MG PO TABS: 1.0000 | ORAL_TABLET | ORAL | 0 refills | Status: AC | PRN

## 2021-08-22 NOTE — ED Provider Notes (Signed)
[facility] Emergency Department Provider Note  Time seen: 3:24 PM  I have reviewed the triage vital signs and the nursing notes.   HISTORY  Chief Complaint Flank Pain   HPI Meghan Jarvis is a 45 y.o. female with a past medical history of HIV, asthma, depression, presents emergency department for lower back pain.  According to the patient over the past 4 weeks or so she has been experiencing intermittent lower back pain however has been worse over the past 3 to 4 days.  Patient states the pain seems to go into her bilateral lower back and occasionally into bilateral buttocks.  Denies any pain shooting down the legs.  Denies any bladder or bowel incontinence.  No fever.  States she has had lower back pain in the past and it flares up at times.   Past Medical History:  Diagnosis Date   Asthma    Depression    HIV (human immunodeficiency virus infection) [facility])     Patient Active Problem List   Diagnosis Date Noted   Atrial fibrillation, new onset (HCC) 05/11/2020   Pyelonephritis 05/09/2020   HIV (human immunodeficiency virus infection) (HCC)    Alcohol use disorder, severe, dependence (HCC) 05/18/2016   Alcohol abuse 05/17/2016    Past Surgical History:  Procedure Laterality Date   ABDOMINAL HYSTERECTOMY     APPENDECTOMY      Prior to Admission medications   Medication Sig Start Date End Date Taking? Authorizing Provider  bictegravir-emtricitabine-tenofovir AF (BIKTARVY) 50-200-25 MG TABS tablet Take 1 tablet by mouth daily.    [provider]  DULoxetine (CYMBALTA) 30 MG capsule Take 90 mg by mouth daily.    [provider]  gabapentin (NEURONTIN) 300 MG capsule Take 300 mg by mouth 3 (three) times daily.    [provider]  methylPREDNISolone (MEDROL DOSEPAK) 4 MG TBPK tablet Take Tapered dose as directed 06/07/21   Joni Reining, PA-C  orphenadrine (NORFLEX) 100 MG tablet Take 1 tablet (100 mg total) by mouth 2 (two)  times daily. 06/07/21   Joni Reining, PA-C  polycarbophil (FIBERCON) 625 MG tablet Take 625 mg by mouth daily.    [provider]  predniSONE (DELTASONE) 50 MG tablet 1 tab by mouth daily 03/22/21   Sharyn Creamer, MD    Allergies  Allergen Reactions   Shellfish Allergy Other (See Comments)    Crab legs result in itching   Buprenorphine Hcl     Pt states she not allergic   Morphine And Related Itching    hives    Family History  Family history unknown: Yes    Social History Social History   Tobacco Use   Smoking status: Every Day    Packs/day: 1.00    Types: Cigarettes   Smokeless tobacco: Never  Substance Use Topics   Alcohol use: Yes   Drug use: No    Review of Systems Constitutional: Negative for fever. Cardiovascular: Negative for chest pain. Respiratory: Negative for shortness of breath. Gastrointestinal: Negative for abdominal pain, vomiting Musculoskeletal: Moderate lower back pain Skin: Negative for skin complaints  Neurological: Negative for headache All other ROS negative  ____________________________________________   PHYSICAL EXAM:  VITAL SIGNS: ED Triage Vitals  Enc Vitals Group     BP 08/22/21 1232 102/70     Pulse Rate 08/22/21 1232 88     Resp 08/22/21 1232 16     Temp 08/22/21 1232 98.9 F (37.2 C)     Temp Source  08/22/21 1232 Oral     SpO2 08/22/21 1232 98 %     Weight --      Height 08/22/21 1232 5\' 8"  (1.727 m)     Head Circumference --      Peak Flow --      Pain Score 08/22/21 1258 10     Pain Loc --      Pain Edu? --      Excl. in GC? --    Constitutional: Alert and oriented. Well appearing and in no distress. Eyes: Normal exam ENT      Head: Normocephalic and atraumatic.      Mouth/Throat: Mucous membranes are moist. Cardiovascular: Normal rate, regular rhythm.  Respiratory: Normal respiratory effort without tachypnea nor retractions. Breath sounds are clear  Gastrointestinal: Soft and nontender. No distention.   Musculoskeletal: Moderate tenderness to palpation in the mid lower back and left lower back. Neurologic:  Normal speech and language. No gross focal neurologic deficits  Skin:  Skin is warm, dry and intact.  Psychiatric: Mood and affect are normal.    RADIOLOGY   CT negative for acute abnormality. ____________________________________________   INITIAL IMPRESSION / ASSESSMENT AND PLAN / ED COURSE  Pertinent labs & imaging results that were available during my care of the patient were reviewed by me and considered in my medical decision making (see chart for details).   Patient presents emergency department for lower back and left lower back pain intermittent over the past several weeks worse over the past 3 to 4 days.  No incontinence.  Lab work is reassuring.  However given the patient's degree of pain we will obtain CT imaging as a precaution to rule out any other abnormality.  As long as CT shows no acute abnormality anticipate likely discharge home with a prednisone taper and short course of pain medication.  Patient agreeable to plan of care.  Patient's work-up is nonrevealing.  CT scan is negative for acute abnormality.  We will discharge with a course of prednisone and a short course of pain medication.  Patient agreeable to plan of care.  Meghan Jarvis was evaluated in Emergency Department on 08/22/2021 for the symptoms described in the history of present illness. She was evaluated in the context of the global COVID-19 pandemic, which necessitated consideration that the patient might be at risk for infection with the SARS-CoV-2 virus that causes COVID-19. Institutional protocols and algorithms that pertain to the evaluation of patients at risk for COVID-19 are in a state of rapid change based on information released by regulatory bodies including the CDC and federal and state organizations. These policies and algorithms were followed during the patient's care in the  ED.  ____________________________________________   FINAL CLINICAL IMPRESSION(S) / ED DIAGNOSES  Lower back pain   10/22/2021, MD 08/22/21 1701

## 2021-08-22 NOTE — ED Triage Notes (Signed)
Pt to ED for lower bilateral flank pain radiating to lower abd for the past couple months.  +diarrhea Reports increased urination frequency with burning

## 2021-08-22 NOTE — ED Triage Notes (Signed)
Unable to collect labs d/t difficult stick. Lab contacted

## 2022-01-30 ENCOUNTER — Emergency Department
Admission: EM | Admit: 2022-01-30 | Discharge: 2022-01-30 | Disposition: A | Discharge status: Home / Self Care | Payer: Medicaid Other | Attending: Emergency Medicine | Admitting: Emergency Medicine

## 2022-01-30 ENCOUNTER — Other Ambulatory Visit: Payer: Self-pay

## 2022-01-30 ENCOUNTER — Emergency Department: Payer: Medicaid Other

## 2022-01-30 ENCOUNTER — Encounter: Payer: Self-pay | Admitting: Emergency Medicine

## 2022-01-30 DIAGNOSIS — W01198A Fall on same level from slipping, tripping and stumbling with subsequent striking against other object, initial encounter: Secondary | ICD-10-CM | POA: Diagnosis not present

## 2022-01-30 DIAGNOSIS — R0781 Pleurodynia: Secondary | ICD-10-CM

## 2022-01-30 DIAGNOSIS — W19XXXA Unspecified fall, initial encounter: Secondary | ICD-10-CM

## 2022-01-30 DIAGNOSIS — R0789 Other chest pain: Secondary | ICD-10-CM

## 2022-01-30 MED ORDER — LIDOCAINE 5 % EX PTCH: 1.0000 | MEDICATED_PATCH | Freq: Two times a day (BID) | CUTANEOUS | 0 refills | Status: AC

## 2022-01-30 MED ORDER — LIDOCAINE 5 % EX PTCH
1.0000 | MEDICATED_PATCH | CUTANEOUS | Status: DC
  Administered 2022-01-30: 1 via TRANSDERMAL
  Filled 2022-01-30: qty 1

## 2022-01-30 MED ORDER — ACETAMINOPHEN 500 MG PO TABS
1000.0000 mg | ORAL_TABLET | Freq: Once | ORAL | Status: AC
  Administered 2022-01-30: 1000 mg via ORAL
  Filled 2022-01-30: qty 2

## 2022-01-30 MED ORDER — NAPROXEN 500 MG PO TABS
500.0000 mg | ORAL_TABLET | Freq: Once | ORAL | Status: AC
  Administered 2022-01-30: 500 mg via ORAL
  Filled 2022-01-30: qty 1

## 2022-01-30 NOTE — ED Provider Notes (Signed)
[facility] Provider Note    Event Date/Time   First MD Initiated Contact with Patient 01/30/22 1038     (approximate)   History   Fall   HPI  Meghan Jarvis is a 46 y.o. female who presents to the ED for evaluation of Fall   Obese patient, on Biktarvy for HIV but no anticoagulation.  Patient presents to the ED, accompanied by her fianc, for evaluation of increasing right flank pain since a mechanical fall that occurred yesterday.  She reports tripping and falling, striking her right sided chest on concrete causing immediate pain.  Denies head trauma or syncope.  Reports increasing pain to her right flank since that time, worse with deep inspiration and when she touches it.  Has not used any medications yet.  Reports she cannot sleep on that side because it hurts.  Denies emesis, diarrhea or dysuria.  Denies frontal abdominal pain.   Physical Exam   Triage Vital Signs: ED Triage Vitals  Enc Vitals Group     BP 01/30/22 1026 (!) 120/91     Pulse Rate 01/30/22 1026 71     Resp 01/30/22 1026 16     Temp 01/30/22 1026 98.1 F (36.7 C)     Temp Source 01/30/22 1026 Oral     SpO2 01/30/22 1026 96 %     Weight 01/30/22 1027 200 lb (90.7 kg)     Height 01/30/22 1027 5\' 8"  (1.727 m)     Head Circumference --      Peak Flow --      Pain Score 01/30/22 1026 10     Pain Loc --      Pain Edu? --      Excl. in [place]? --     Most recent vital signs: Vitals:   01/30/22 1026  BP: (!) 120/91  Pulse: 71  Resp: 16  Temp: 98.1 F (36.7 C)  SpO2: 96%    General: Awake, no distress.  Obese.  Pleasant and conversational. CV:  Good peripheral perfusion.  Resp:  Normal effort.  Abd:  No distention.  Soft and benign throughout with deep palpation MSK:  No deformity noted.    Tender to palpation to her right lateral inferior chest wall around ribs 9 or 10.  No overlying skin changes or external signs of trauma.  No evidence of open injury. Neuro:  No focal  deficits appreciated. Other:     ED Results / Procedures / Treatments   Labs (all labs ordered are listed, but only abnormal results are displayed) Labs Reviewed - No data to display  EKG   RADIOLOGY Plain film of the chest reviewed by me without evidence of fracture or PTX  Official radiology report(s): DG Ribs Unilateral W/Chest Right  Result Date: 01/30/2022 CLINICAL DATA:  Rib pain EXAM: RIGHT RIBS AND CHEST - 3+ VIEW COMPARISON:  Radiograph 01/27/2021 FINDINGS: Cardiomediastinal silhouette is within normal limits. There is no focal airspace disease. There is no large pleural effusion. There is no visible pneumothorax. There is no acute osseous abnormality. Specifically, there is no evidence of displaced rib fracture. IMPRESSION: No evidence of displaced rib fracture. No acute cardiopulmonary disease. Electronically Signed   By: Maurine Simmering M.D.   On: 01/30/2022 11:01   CT Chest Wo Contrast  Result Date: 01/30/2022 CLINICAL DATA:  Right rib pain after fall EXAM: CT CHEST WITHOUT CONTRAST TECHNIQUE: Multidetector CT imaging of the chest was performed following the standard protocol without IV contrast.  RADIATION DOSE REDUCTION: This exam was performed according to the departmental dose-optimization program which includes automated exposure control, adjustment of the mA and/or kV according to patient size and/or use of iterative reconstruction technique. COMPARISON:  Rib series 01/30/2022.  Chest CT 02/23/2014 FINDINGS: Cardiovascular: Heart size is normal. No pericardial effusion. Thoracic aorta is nonaneurysmal. Minimal atherosclerotic calcification of the aorta and coronary arteries. Mediastinum/Nodes: Multiple mildly prominent bilateral axillary lymph nodes. No mediastinal lymphadenopathy. Evaluation of the hilar structures is limited in the absence of intravenous contrast. Within this limitation, no obvious hilar adenopathy or mass is identified. Thyroid, trachea, and esophagus within  normal limits. Lungs/Pleura: No evidence of pulmonary contusion or laceration. No focal airspace consolidation. No pleural effusion or pneumothorax. Upper Abdomen: No acute abnormality. Musculoskeletal: No acute osseous abnormality. Specifically, no rib fracture identified. Thoracic vertebral body heights and alignment are maintained. No chest wall hematoma. IMPRESSION: 1. No acute osseous abnormality of the chest. Specifically, no rib fracture identified. 2. Multiple mildly prominent bilateral axillary lymph nodes, nonspecific. Electronically Signed   By: Davina Poke D.O.   On: 01/30/2022 12:11    PROCEDURES and INTERVENTIONS:  Procedures  Medications  lidocaine (LIDODERM) 5 % 1 patch (1 patch Transdermal Patch Applied 01/30/22 1138)  acetaminophen (TYLENOL) tablet 1,000 mg (1,000 mg Oral Given 01/30/22 1136)  naproxen (NAPROSYN) tablet 500 mg (500 mg Oral Given 01/30/22 1136)     IMPRESSION / MDM / ASSESSMENT AND PLAN / ED COURSE  I reviewed the triage vital signs and the nursing notes.  46 year old female presents to the ED with right-sided chest wall pain after mechanical fall, without evidence of rib fractures and likely representing more superficial MSK pain.  She looks clinically well without evidence of trauma beyond tenderness to right lateral rib cage.  Plain film is nondiagnostic, and due to her pain CT chest without contrast performed and without evidence of rib fracture, PTX.  No signs of trauma or sites of pain otherwise to necessitate other diagnostics.  Feeling better with lidocaine patch and Tylenol/NSAIDs.  We discussed the same at home and return precautions for the ED.  Clinical Course as of 01/30/22 1224  Mon Jan 30, 2022  1217 Reassessed.  Patient reports feeling better with medications.  We discussed reassuring CT and management at home.  We discussed return precautions. [DS]    Clinical Course User Index [DS] Vladimir Crofts, MD     FINAL CLINICAL IMPRESSION(S) / ED  DIAGNOSES   Final diagnoses:  Fall, initial encounter  Rib pain on right side     Rx / DC Orders   ED Discharge Orders          Ordered    lidocaine (LIDODERM) 5 %  Every 12 hours        01/30/22 1214             Note:  This document was prepared using Dragon voice recognition software and may include unintentional dictation errors.   Vladimir Crofts, MD 01/30/22 1225

## 2022-01-30 NOTE — Discharge Instructions (Signed)
Please take Tylenol and ibuprofen/Advil for your pain.  It is safe to take them together, or to alternate them every few hours.  Take up to 1000mg of Tylenol at a time, up to 4 times per day.  Do not take more than 4000 mg of Tylenol in 24 hours.  For ibuprofen, take 400-600 mg, 4-5 times per day. ° ° °
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
## 2022-07-12 ENCOUNTER — Other Ambulatory Visit: Payer: Self-pay

## 2022-07-12 ENCOUNTER — Encounter: Payer: Self-pay | Admitting: Emergency Medicine

## 2022-07-12 ENCOUNTER — Emergency Department: Payer: Medicaid Other

## 2022-07-12 ENCOUNTER — Emergency Department
Admission: EM | Admit: 2022-07-12 | Discharge: 2022-07-12 | Disposition: A | Discharge status: Home / Self Care | Payer: Medicaid Other | Attending: Emergency Medicine | Admitting: Emergency Medicine

## 2022-07-12 DIAGNOSIS — M542 Cervicalgia: Secondary | ICD-10-CM | POA: Diagnosis not present

## 2022-07-12 DIAGNOSIS — M545 Low back pain, unspecified: Secondary | ICD-10-CM | POA: Insufficient documentation

## 2022-07-12 DIAGNOSIS — Z23 Encounter for immunization: Secondary | ICD-10-CM | POA: Insufficient documentation

## 2022-07-12 DIAGNOSIS — S0993XA Unspecified injury of face, initial encounter: Secondary | ICD-10-CM | POA: Diagnosis present

## 2022-07-12 DIAGNOSIS — S02411A LeFort I fracture, initial encounter for closed fracture: Secondary | ICD-10-CM | POA: Diagnosis not present

## 2022-07-12 LAB — CBC WITH DIFFERENTIAL/PLATELET
Abs Immature Granulocytes: 0.01 10*3/uL (ref 0.00–0.07)
Basophils Absolute: 0 10*3/uL (ref 0.0–0.1)
Basophils Relative: 0 %
Eosinophils Absolute: 0.3 10*3/uL (ref 0.0–0.5)
Eosinophils Relative: 5 %
HCT: 37.9 % (ref 36.0–46.0)
Hemoglobin: 11.9 g/dL — ABNORMAL LOW (ref 12.0–15.0)
Immature Granulocytes: 0 %
Lymphocytes Relative: 32 %
Lymphs Abs: 1.8 10*3/uL (ref 0.7–4.0)
MCH: 25.9 pg — ABNORMAL LOW (ref 26.0–34.0)
MCHC: 31.4 g/dL (ref 30.0–36.0)
MCV: 82.6 fL (ref 80.0–100.0)
Monocytes Absolute: 0.4 10*3/uL (ref 0.1–1.0)
Monocytes Relative: 7 %
Neutro Abs: 3.1 10*3/uL (ref 1.7–7.7)
Neutrophils Relative %: 56 %
Platelets: 211 10*3/uL (ref 150–400)
RBC: 4.59 MIL/uL (ref 3.87–5.11)
RDW: 15.7 % — ABNORMAL HIGH (ref 11.5–15.5)
Smear Review: NORMAL
WBC: 5.7 10*3/uL (ref 4.0–10.5)
nRBC: 0 % (ref 0.0–0.2)

## 2022-07-12 LAB — BASIC METABOLIC PANEL
Anion gap: 5 (ref 5–15)
BUN: 10 mg/dL (ref 6–20)
CO2: 26 mmol/L (ref 22–32)
Calcium: 8.8 mg/dL — ABNORMAL LOW (ref 8.9–10.3)
Chloride: 107 mmol/L (ref 98–111)
Creatinine, Ser: 0.71 mg/dL (ref 0.44–1.00)
GFR, Estimated: >60 mL/min (ref >60)
Glucose, Bld: 120 mg/dL — ABNORMAL HIGH (ref 70–99)
Potassium: 3.8 mmol/L (ref 3.5–5.1)
Sodium: 138 mmol/L (ref 135–145)

## 2022-07-12 MED ORDER — TETANUS-DIPHTH-ACELL PERTUSSIS 5-2.5-18.5 LF-MCG/0.5 IM SUSY
0.5000 mL | PREFILLED_SYRINGE | Freq: Once | INTRAMUSCULAR | Status: AC
  Administered 2022-07-12: 0.5 mL via INTRAMUSCULAR
  Filled 2022-07-12: qty 0.5

## 2022-07-12 MED ORDER — SODIUM CHLORIDE 0.9 % IV SOLN
3.0000 g | Freq: Once | INTRAVENOUS | Status: AC
  Administered 2022-07-12: 3 g via INTRAVENOUS
  Filled 2022-07-12: qty 8

## 2022-07-12 MED ORDER — FENTANYL CITRATE PF 50 MCG/ML IJ SOSY
50.0000 ug | PREFILLED_SYRINGE | Freq: Once | INTRAMUSCULAR | Status: AC
  Administered 2022-07-12: 50 ug via INTRAVENOUS
  Filled 2022-07-12: qty 1

## 2022-07-12 MED ORDER — SODIUM CHLORIDE 0.9 % IV BOLUS
1000.0000 mL | Freq: Once | INTRAVENOUS | Status: AC
  Administered 2022-07-12: 1000 mL via INTRAVENOUS

## 2022-07-12 MED ORDER — OXYCODONE-ACETAMINOPHEN 10-325 MG PO TABS: 1.0000 | ORAL_TABLET | Freq: Four times a day (QID) | ORAL | 0 refills | Status: AC | PRN

## 2022-07-12 MED ORDER — AMOXICILLIN-POT CLAVULANATE 875-125 MG PO TABS: 1.0000 | ORAL_TABLET | Freq: Two times a day (BID) | ORAL | 0 refills | Status: DC
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
## 2022-07-12 NOTE — ED Notes (Signed)
[name]  TRANSFER  CENTER  CALLED  PER  DR  STAFFORD  MD  CT  SCAN  POWERSHARE  WITH  [facility]
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
## 2023-03-20 ENCOUNTER — Emergency Department
Admission: EM | Admit: 2023-03-20 | Discharge: 2023-03-20 | Disposition: A | Discharge status: Home / Self Care | Payer: Medicaid Other | Attending: Emergency Medicine | Admitting: Emergency Medicine

## 2023-03-20 ENCOUNTER — Emergency Department: Payer: Medicaid Other

## 2023-03-20 ENCOUNTER — Other Ambulatory Visit: Payer: Self-pay

## 2023-03-20 DIAGNOSIS — R059 Cough, unspecified: Secondary | ICD-10-CM | POA: Insufficient documentation

## 2023-03-20 DIAGNOSIS — E119 Type 2 diabetes mellitus without complications: Secondary | ICD-10-CM | POA: Insufficient documentation

## 2023-03-20 DIAGNOSIS — Z21 Asymptomatic human immunodeficiency virus [HIV] infection status: Secondary | ICD-10-CM | POA: Insufficient documentation

## 2023-03-20 DIAGNOSIS — R0789 Other chest pain: Secondary | ICD-10-CM | POA: Diagnosis present

## 2023-03-20 LAB — BASIC METABOLIC PANEL
Anion gap: 8 (ref 5–15)
BUN: 9 mg/dL (ref 6–20)
CO2: 22 mmol/L (ref 22–32)
Calcium: 8.5 mg/dL — ABNORMAL LOW (ref 8.9–10.3)
Chloride: 108 mmol/L (ref 98–111)
Creatinine, Ser: 0.65 mg/dL (ref 0.44–1.00)
GFR, Estimated: >60 mL/min (ref >60)
Glucose, Bld: 90 mg/dL (ref 70–99)
Potassium: 3.7 mmol/L (ref 3.5–5.1)
Sodium: 138 mmol/L (ref 135–145)

## 2023-03-20 LAB — CBC
HCT: 35.7 % — ABNORMAL LOW (ref 36.0–46.0)
Hemoglobin: 11.4 g/dL — ABNORMAL LOW (ref 12.0–15.0)
MCH: 27.1 pg (ref 26.0–34.0)
MCHC: 31.9 g/dL (ref 30.0–36.0)
MCV: 85 fL (ref 80.0–100.0)
Platelets: 132 10*3/uL — ABNORMAL LOW (ref 150–400)
RBC: 4.2 MIL/uL (ref 3.87–5.11)
RDW: 15 % (ref 11.5–15.5)
WBC: 4.5 10*3/uL (ref 4.0–10.5)
nRBC: 0 % (ref 0.0–0.2)

## 2023-03-20 LAB — TROPONIN I (HIGH SENSITIVITY): Troponin I (High Sensitivity): 3 ng/L (ref <18)

## 2023-03-20 MED ORDER — IOHEXOL 350 MG/ML SOLN
75.0000 mL | Freq: Once | INTRAVENOUS | Status: AC | PRN
  Administered 2023-03-20: 75 mL via INTRAVENOUS

## 2023-03-20 MED ORDER — IPRATROPIUM-ALBUTEROL 0.5-2.5 (3) MG/3ML IN SOLN
3.0000 mL | Freq: Once | RESPIRATORY_TRACT | Status: AC
  Administered 2023-03-20: 3 mL via RESPIRATORY_TRACT
  Filled 2023-03-20: qty 3

## 2023-03-20 MED ORDER — ONDANSETRON 4 MG PO TBDP: 4.0000 mg | ORAL_TABLET | Freq: Three times a day (TID) | ORAL | 0 refills | Status: DC | PRN

## 2023-03-20 MED ORDER — KETOROLAC TROMETHAMINE 30 MG/ML IJ SOLN
30.0000 mg | Freq: Once | INTRAMUSCULAR | Status: AC
  Administered 2023-03-20: 30 mg via INTRAMUSCULAR
  Filled 2023-03-20: qty 1

## 2023-03-20 MED ORDER — ONDANSETRON HCL 4 MG/2ML IJ SOLN
4.0000 mg | Freq: Once | INTRAMUSCULAR | Status: AC
  Administered 2023-03-20: 4 mg via INTRAVENOUS
  Filled 2023-03-20: qty 2

## 2023-03-20 NOTE — ED Provider Notes (Signed)
[facility] Provider Note    Event Date/Time   First MD Initiated Contact with Patient 03/20/23 1139     (approximate)   History   Chest Pain   HPI  Meghan Jarvis is a 47 y.o. female with a history of HIV who presents with complaints of cough, chest discomfort which has been ongoing for about 2 days.  She denies fevers or chills.  No calf pain or swelling.  No history of DVT or blood clots.  No history of heart disease reported.     Physical Exam   Triage Vital Signs: ED Triage Vitals [03/20/23 1114]  Enc Vitals Group     BP 110/72     Pulse Rate 94     Resp 18     Temp 98.6 F (37 C)     Temp Source Oral     SpO2 99 %     Weight      Height      Head Circumference      Peak Flow      Pain Score 6     Pain Loc      Pain Edu?      Excl. in [place]?     Most recent vital signs: Vitals:   03/20/23 1114  BP: 110/72  Pulse: 94  Resp: 18  Temp: 98.6 F (37 C)  SpO2: 99%     General: Awake, no distress.  CV:  Good peripheral perfusion.  Resp:  Normal effort.  Clear to auscultation bilaterally Abd:  No distention.  Soft, nontender Other:  No calf pain or swelling   ED Results / Procedures / Treatments   Labs (all labs ordered are listed, but only abnormal results are displayed) Labs Reviewed  BASIC METABOLIC PANEL - Abnormal; Notable for the following components:      Result Value   Calcium 8.5 (*)    All other components within normal limits  CBC - Abnormal; Notable for the following components:   Hemoglobin 11.4 (*)    HCT 35.7 (*)    Platelets 132 (*)    All other components within normal limits  TROPONIN I (HIGH SENSITIVITY)     EKG  ED ECG REPORT I, Jene Every, the attending physician, personally viewed and interpreted this ECG.  Date: 03/20/2023  Rhythm: normal sinus rhythm QRS Axis: normal Intervals: normal ST/T Wave abnormalities: normal Narrative Interpretation: no evidence of acute  ischemia    RADIOLOGY Chest x-ray viewed interpret by me, no acute abnormality    PROCEDURES:  Critical Care performed:   Procedures   MEDICATIONS ORDERED IN ED: Medications  ipratropium-albuterol (DUONEB) 0.5-2.5 (3) MG/3ML nebulizer solution 3 mL (3 mLs Nebulization Given 03/20/23 1232)  ipratropium-albuterol (DUONEB) 0.5-2.5 (3) MG/3ML nebulizer solution 3 mL (3 mLs Nebulization Given 03/20/23 1232)  ketorolac (TORADOL) 30 MG/ML injection 30 mg (30 mg Intramuscular Given 03/20/23 1232)  iohexol (OMNIPAQUE) 350 MG/ML injection 75 mL (75 mLs Intravenous Contrast Given 03/20/23 1407)  ondansetron (ZOFRAN) injection 4 mg (4 mg Intravenous Given 03/20/23 1509)     IMPRESSION / MDM / ASSESSMENT AND PLAN / ED COURSE  I reviewed the triage vital signs and the nursing notes. Patient's presentation is most consistent with acute presentation with potential threat to life or bodily function.  Patient presents with chest discomfort as above, differential includes ACS, PE, pneumonia, bronchitis  Lab work reviewed and is overall reassuring, chest x-ray without evidence of pneumonia  Sent for CT scan which  is negative for PE or pneumonia  Patient is feeling better after treatment, no indication for admission at this time given reassuring workup, will have her follow-up closely with cardiology, strict return precautions discussed, she agrees with this plan.        FINAL CLINICAL IMPRESSION(S) / ED DIAGNOSES   Final diagnoses:  Atypical chest pain     Rx / DC Orders   ED Discharge Orders          Ordered    Ambulatory referral to Cardiology       Comments: If you have not heard from the Cardiology office within the next 72 hours please call [phone].   03/20/23 1505    ondansetron (ZOFRAN-ODT) 4 MG disintegrating tablet  Every 8 hours PRN        03/20/23 1505             Note:  This document was prepared using Dragon voice recognition software and may include  unintentional dictation errors.   Jene Every, MD 03/20/23 [phone]

## 2023-03-20 NOTE — ED Triage Notes (Signed)
Pt to ED via [facility] from home. Pt reports CP that has been constant x2 days. Pt also reports productive cough. Pt hx DM.

## 2023-03-20 NOTE — ED Notes (Signed)
Patient transported to CT 

## 2023-08-29 NOTE — Congregational Nurse Program (Signed)
  Dept: [phone]   Congregational Nurse Program Note  Date of Encounter: 08/29/2023 Client to [facility] day center with need of assistance in setting an apt at [facility] infectious disease clinic. She reports she has not been to the clinic "in a long time" and has been with out her HIV medications. Clinic contacted and an apt obtained for 9/26 at 1 pm. RN to assist client with setting up Medicaid transportation. Medicaid verified as [facility] with [facility] health financial navigator. Client does not have her card or any paper work. RN to assist in replacing the card. Francesco Runner BSN, RN Past Medical History: Past Medical History:  Diagnosis Date   Asthma    Depression    HIV (human immunodeficiency virus infection) (HCC)     Encounter Details:  CNP Questionnaire - 08/29/23 1220       Questionnaire   Ask client: Do you give verbal consent for me to treat you today? Yes    Student Assistance N/A    Location Patient Served  [facility]    Visit Setting with Client Organization    Patient Status Unhoused   per report client has had to leave her boarding room   Insurance [employer] health, verified with [facility] finacial navigator   Insurance/Financial Assistance Referral N/A    Medication Have Medication Insecurities   clients report she has not had her HIV medications "in a long time"   Medical Provider No   client has not seen any provided at [facility] "in a long time" per her report   Screening Referrals Made N/A    Medical Referrals Made [facility] PCP/Clinic   [name] infectionous disease clinic at client request   Medical Appointment Made Non-[facility] PCP/clinic   apt made at [facility] infectious disease clinic for 9/26 at 1:00 pm   Recently w/o PCP, now 1st time PCP visit completed due to CNs referral or appointment made N/A    Food Have Food Insecurities    Transportation Need transportation assistance   RN to help client arrange medicaid transportaion for upcoming apt   Housing/Utilities No  permanent housing    Interpersonal Safety N/A    Interventions Advocate/Support;Navigate Healthcare System    Abnormal to Normal Screening Since Last CN Visit N/A    Screenings CN Performed N/A    Sent Client to Lab for: N/A    Did client attend any of the following based off CNs referral or appointments made? N/A    ED Visit Averted N/A    Life-Saving Intervention Made N/A

## 2023-08-30 NOTE — Congregational Nurse Program (Signed)
  Dept: [phone]   Congregational Nurse Program Note  Date of Encounter: 08/30/2023 Client to [facility] day center for assistance in setting up medicaid transportation to her [facility] apt next week. Transportation set up. Client to be picked up at 11:30 at the center. Client elected to have the return trip a will call. Trip reference number [id number]. Client had complaints of foot pain, great toe on both feet appeared red, no edema. She also reports she is a diabetic and does not have her medication. She does plan to get all of her medication next week at her [facility] apt. RnNto continue to follow. Francesco Runner BSN, RN Past Medical History: Past Medical History:  Diagnosis Date   Asthma    Depression    HIV (human immunodeficiency virus infection) (HCC)     Encounter Details:  CNP Questionnaire - 08/30/23 1000       Questionnaire   Ask client: Do you give verbal consent for me to treat you today? Yes    Student Assistance N/A    Location Patient Served  [facility]    Visit Setting with Client Organization    Patient Status Unhoused   per report client has had to leave her boarding room   Insurance [employer] health, verified with [facility] finacial navigator   Insurance/Financial Assistance Referral N/A    Medication Have Medication Insecurities   clients report she has not had her HIV medications "in a long time"   Medical Provider No   client has not seen any provided at [facility] "in a long time" per her report   Screening Referrals Made N/A    Medical Referrals Made [facility] PCP/Clinic   [name] infectionous disease clinic at client request   Medical Appointment Made Non-[facility] PCP/clinic   apt made at [facility] infectious disease clinic for 9/26 at 1:00 pm   Recently w/o PCP, now 1st time PCP visit completed due to CNs referral or appointment made N/A    Food Have Food Insecurities    Transportation Provided transportation assistance   arranged for [employer] transportation for apt on 9/26. p/u at  11:30 at [facility]   Housing/Utilities No permanent housing    Interpersonal Safety N/A    Interventions Advocate/Support;Navigate Healthcare System    Abnormal to Normal Screening Since Last CN Visit N/A    Screenings CN Performed N/A    Sent Client to Lab for: N/A    Did client attend any of the following based off CNs referral or appointments made? N/A    ED Visit Averted N/A    Life-Saving Intervention Made N/A

## 2023-09-23 ENCOUNTER — Emergency Department: Payer: MEDICAID

## 2023-09-23 ENCOUNTER — Other Ambulatory Visit: Payer: Self-pay

## 2023-09-23 DIAGNOSIS — J189 Pneumonia, unspecified organism: Secondary | ICD-10-CM | POA: Diagnosis not present

## 2023-09-23 DIAGNOSIS — J45909 Unspecified asthma, uncomplicated: Secondary | ICD-10-CM | POA: Diagnosis not present

## 2023-09-23 DIAGNOSIS — Z20822 Contact with and (suspected) exposure to covid-19: Secondary | ICD-10-CM | POA: Diagnosis not present

## 2023-09-23 DIAGNOSIS — Z21 Asymptomatic human immunodeficiency virus [HIV] infection status: Secondary | ICD-10-CM | POA: Insufficient documentation

## 2023-09-23 DIAGNOSIS — R059 Cough, unspecified: Secondary | ICD-10-CM | POA: Diagnosis present

## 2023-09-23 LAB — CBC WITH DIFFERENTIAL/PLATELET
Abs Immature Granulocytes: 0.01 10*3/uL (ref 0.00–0.07)
Basophils Absolute: 0 10*3/uL (ref 0.0–0.1)
Basophils Relative: 0 %
Eosinophils Absolute: 0.3 10*3/uL (ref 0.0–0.5)
Eosinophils Relative: 6 %
HCT: 34.4 % — ABNORMAL LOW (ref 36.0–46.0)
Hemoglobin: 10.6 g/dL — ABNORMAL LOW (ref 12.0–15.0)
Immature Granulocytes: 0 %
Lymphocytes Relative: 9 %
Lymphs Abs: 0.5 10*3/uL — ABNORMAL LOW (ref 0.7–4.0)
MCH: 26.8 pg (ref 26.0–34.0)
MCHC: 30.8 g/dL (ref 30.0–36.0)
MCV: 87.1 fL (ref 80.0–100.0)
Monocytes Absolute: 0.4 10*3/uL (ref 0.1–1.0)
Monocytes Relative: 7 %
Neutro Abs: 4.2 10*3/uL (ref 1.7–7.7)
Neutrophils Relative %: 78 %
Platelets: 199 10*3/uL (ref 150–400)
RBC: 3.95 MIL/uL (ref 3.87–5.11)
RDW: 14.8 % (ref 11.5–15.5)
WBC: 5.4 10*3/uL (ref 4.0–10.5)
nRBC: 0 % (ref 0.0–0.2)

## 2023-09-23 LAB — RESP PANEL BY RT-PCR (RSV, FLU A&B, COVID)  RVPGX2
Influenza A by PCR: NEGATIVE
Influenza B by PCR: NEGATIVE
Resp Syncytial Virus by PCR: NEGATIVE
SARS Coronavirus 2 by RT PCR: NEGATIVE

## 2023-09-23 LAB — COMPREHENSIVE METABOLIC PANEL
ALT: 16 U/L (ref 0–44)
AST: 24 U/L (ref 15–41)
Albumin: 3.1 g/dL — ABNORMAL LOW (ref 3.5–5.0)
Alkaline Phosphatase: 66 U/L (ref 38–126)
Anion gap: 9 (ref 5–15)
BUN: 19 mg/dL (ref 6–20)
CO2: 22 mmol/L (ref 22–32)
Calcium: 8.6 mg/dL — ABNORMAL LOW (ref 8.9–10.3)
Chloride: 108 mmol/L (ref 98–111)
Creatinine, Ser: 1 mg/dL (ref 0.44–1.00)
GFR, Estimated: >60 mL/min (ref >60)
Glucose, Bld: 97 mg/dL (ref 70–99)
Potassium: 4.4 mmol/L (ref 3.5–5.1)
Sodium: 139 mmol/L (ref 135–145)
Total Bilirubin: 0.4 mg/dL (ref 0.3–1.2)
Total Protein: 8.9 g/dL — ABNORMAL HIGH (ref 6.5–8.1)

## 2023-09-23 MED ORDER — ACETAMINOPHEN 325 MG PO TABS
650.0000 mg | ORAL_TABLET | Freq: Once | ORAL | Status: AC
  Administered 2023-09-23: 650 mg via ORAL
  Filled 2023-09-23: qty 2
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
## 2023-09-23 NOTE — ED Triage Notes (Signed)
Pt reports "feeling sick" x2-3weeks. Pt reports generalized weakness and cough/congestion as well.

## 2023-09-24 ENCOUNTER — Emergency Department
Admission: EM | Admit: 2023-09-24 | Discharge: 2023-09-24 | Disposition: A | Discharge status: Home / Self Care | Payer: MEDICAID | Attending: Emergency Medicine | Admitting: Emergency Medicine

## 2023-09-24 DIAGNOSIS — J189 Pneumonia, unspecified organism: Secondary | ICD-10-CM

## 2023-09-24 LAB — LACTIC ACID, PLASMA: Lactic Acid, Venous: 1.1 mmol/L (ref 0.5–1.9)

## 2023-09-24 MED ORDER — HYDROCOD POLI-CHLORPHE POLI ER 10-8 MG/5ML PO SUER
5.0000 mL | Freq: Once | ORAL | Status: AC
  Administered 2023-09-24: 5 mL via ORAL
  Filled 2023-09-24: qty 5

## 2023-09-24 MED ORDER — SODIUM CHLORIDE 0.9 % IV SOLN
1.0000 g | Freq: Once | INTRAVENOUS | Status: AC
  Administered 2023-09-24: 1 g via INTRAVENOUS
  Filled 2023-09-24: qty 10

## 2023-09-24 MED ORDER — SODIUM CHLORIDE 0.9 % IV SOLN
500.0000 mg | Freq: Once | INTRAVENOUS | Status: AC
  Administered 2023-09-24: 500 mg via INTRAVENOUS
  Filled 2023-09-24: qty 5

## 2023-09-24 MED ORDER — AZITHROMYCIN 250 MG PO TABS
250.0000 mg | ORAL_TABLET | Freq: Every day | ORAL | 0 refills | Status: DC
  Filled 2023-09-25: qty 4, 4d supply, fill #0

## 2023-09-24 MED ORDER — AMOXICILLIN-POT CLAVULANATE 875-125 MG PO TABS
1.0000 | ORAL_TABLET | Freq: Two times a day (BID) | ORAL | 0 refills | Status: DC
  Filled 2023-09-25: qty 14, 7d supply, fill #0

## 2023-09-24 MED ORDER — KETOROLAC TROMETHAMINE 30 MG/ML IJ SOLN
15.0000 mg | Freq: Once | INTRAMUSCULAR | Status: AC
  Administered 2023-09-24: 15 mg via INTRAVENOUS
  Filled 2023-09-24: qty 1

## 2023-09-24 MED ORDER — SODIUM CHLORIDE 0.9 % IV BOLUS
1000.0000 mL | Freq: Once | INTRAVENOUS | Status: AC
  Administered 2023-09-24: 1000 mL via INTRAVENOUS

## 2023-09-24 MED ORDER — ACETAMINOPHEN 500 MG PO TABS: 1000.0000 mg | ORAL_TABLET | Freq: Once | ORAL | Status: DC

## 2023-09-24 MED ORDER — HYDROCOD POLI-CHLORPHE POLI ER 10-8 MG/5ML PO SUER
5.0000 mL | Freq: Two times a day (BID) | ORAL | 0 refills | Status: DC | PRN
Start: 2023-09-24 — End: 2023-11-04

## 2023-09-24 NOTE — ED Provider Notes (Signed)
[facility] Provider Note    Event Date/Time   First MD Initiated Contact with Patient 09/24/23 0038     (approximate)   History   Cough   HPI  Meghan Jarvis is a 47 y.o. female who presents to the ED from home with a chief complaint of cough and congestion for 2 to [redacted] weeks along with generalized weakness.  History of HIV with last ID visit in February 2021 where he was documented she was not on ARV, using cocaine.  CD4 522 and VL 14K from December 2019.  Denies fever/chills, chest pain, shortness of breath, abdominal pain, nausea, vomiting or dizziness.     Past Medical History   Past Medical History:  Diagnosis Date   Asthma    Depression    HIV (human immunodeficiency virus infection) [facility])      Active Problem List   Patient Active Problem List   Diagnosis Date Noted   Atrial fibrillation, new onset (HCC) 05/11/2020   Pyelonephritis 05/09/2020   HIV (human immunodeficiency virus infection) (HCC)    Alcohol use disorder, severe, dependence (HCC) 05/18/2016   Alcohol abuse 05/17/2016     Past Surgical History   Past Surgical History:  Procedure Laterality Date   ABDOMINAL HYSTERECTOMY     APPENDECTOMY       Home Medications   Prior to Admission medications   Medication Sig Start Date End Date Taking? Authorizing Provider  amoxicillin-clavulanate (AUGMENTIN) 875-125 MG tablet Take 1 tablet by mouth 2 (two) times daily. 09/24/23  Yes Irean Hong, MD  azithromycin (ZITHROMAX) 250 MG tablet Take 1 tablet (250 mg total) by mouth daily. 09/24/23  Yes Irean Hong, MD  chlorpheniramine-HYDROcodone (TUSSIONEX) 10-8 MG/5ML Take 5 mLs by mouth every 12 (twelve) hours as needed for cough. 09/24/23  Yes Irean Hong, MD  bictegravir-emtricitabine-tenofovir AF (BIKTARVY) 50-200-25 MG TABS tablet Take 1 tablet by mouth daily.    [provider]  DULoxetine (CYMBALTA) 30 MG capsule Take 90 mg by mouth daily.    [provider]   gabapentin (NEURONTIN) 300 MG capsule Take 300 mg by mouth 3 (three) times daily.    [provider]  ondansetron (ZOFRAN-ODT) 4 MG disintegrating tablet Take 1 tablet (4 mg total) by mouth every 8 (eight) hours as needed for nausea or vomiting. 03/20/23   Jene Every, MD  polycarbophil (FIBERCON) 625 MG tablet Take 625 mg by mouth daily.    [provider]     Allergies  Shellfish allergy, Buprenorphine hcl, and Morphine and codeine   Family History   Family History  Family history unknown: Yes     Physical Exam  Triage Vital Signs: ED Triage Vitals  Encounter Vitals Group     BP 09/23/23 2225 111/75     Systolic BP Percentile --      Diastolic BP Percentile --      Pulse Rate 09/23/23 2225 (!) 104     Resp 09/23/23 2225 20     Temp 09/23/23 2225 (!) 100.7 F (38.2 C)     Temp src --      SpO2 09/23/23 2225 98 %     Weight 09/23/23 2225 110 lb (49.9 kg)     Height 09/23/23 2225 5\' 8"  (1.727 m)     Head Circumference --      Peak Flow --      Pain Score 09/23/23 2225 10     Pain Loc --  Pain Education --      Exclude from Growth Chart --     Updated Vital Signs: BP 96/76   Pulse 80   Temp 98.6 F (37 C) (Oral)   Resp 18   Ht 5\' 8"  (1.727 m)   Wt 49.9 kg   SpO2 96%   BMI 16.73 kg/m    General: Awake, mild distress.  CV:  RRR.  Good peripheral perfusion.  Resp:  Normal effort.  CTAB. Abd:  Nontender.  No distention.  Other:  Disheveled.   ED Results / Procedures / Treatments  Labs (all labs ordered are listed, but only abnormal results are displayed) Labs Reviewed  CBC WITH DIFFERENTIAL/PLATELET - Abnormal; Notable for the following components:      Result Value   Hemoglobin 10.6 (*)    HCT 34.4 (*)    Lymphs Abs 0.5 (*)    All other components within normal limits  COMPREHENSIVE METABOLIC PANEL - Abnormal; Notable for the following components:   Calcium 8.6 (*)    Total Protein 8.9 (*)    Albumin 3.1 (*)    All other  components within normal limits  RESP PANEL BY RT-PCR (RSV, FLU A&B, COVID)  RVPGX2  CULTURE, BLOOD (ROUTINE X 2)  CULTURE, BLOOD (ROUTINE X 2)  LACTIC ACID, PLASMA     EKG  None   RADIOLOGY I have independently visualized and interpreted patient's x-ray as well as noted the radiology interpretation:  Chest x-ray: Right middle lobe infiltrate  Official radiology report(s): DG Chest 2 View  Result Date: 09/23/2023 CLINICAL DATA:  Weakness with cough and congestion. EXAM: CHEST - 2 VIEW COMPARISON:  March 20, 2023 FINDINGS: The heart size and mediastinal contours are within normal limits. Mild infiltrate is seen within the inferior medial aspect of the right middle lobe. This is best seen on the lateral view. No pleural effusion or pneumothorax is identified. The visualized skeletal structures are unremarkable. IMPRESSION: Mild right middle lobe infiltrate. Electronically Signed   By: Aram Candela M.D.   On: 09/23/2023 23:28     PROCEDURES:  Critical Care performed: No  .1-3 Lead EKG Interpretation  Performed by: Irean Hong, MD Authorized by: Irean Hong, MD     Interpretation: normal     ECG rate:  95   ECG rate assessment: normal     Rhythm: sinus rhythm     Ectopy: none     Conduction: normal   Comments:     Patient placed on cardiac monitor to evaluate for arrhythmias    MEDICATIONS ORDERED IN ED: Medications  acetaminophen (TYLENOL) tablet 650 mg (650 mg Oral Given 09/23/23 2228)  cefTRIAXone (ROCEPHIN) 1 g in sodium chloride 0.9 % 100 mL IVPB (0 g Intravenous Stopped 09/24/23 0300)  azithromycin (ZITHROMAX) 500 mg in sodium chloride 0.9 % 250 mL IVPB (0 mg Intravenous Stopped 09/24/23 0406)  chlorpheniramine-HYDROcodone (TUSSIONEX) 10-8 MG/5ML suspension 5 mL (5 mLs Oral Given 09/24/23 0137)  sodium chloride 0.9 % bolus 1,000 mL (0 mLs Intravenous Stopped 09/24/23 0406)  ketorolac (TORADOL) 30 MG/ML injection 15 mg (15 mg Intravenous Given 09/24/23 0141)      IMPRESSION / MDM / ASSESSMENT AND PLAN / ED COURSE  I reviewed the triage vital signs and the nursing notes.                             47 year old female presenting with a 2 to 3-week history of  cough and congestion. Differential includes, but is not limited to, viral syndrome, bronchitis including COPD exacerbation, pneumonia, reactive airway disease including asthma, CHF including exacerbation with or without pulmonary/interstitial edema, pneumothorax, ACS, thoracic trauma, and pulmonary embolism.  I personally viewed patient's records and note an infectious disease office visit from 01/2020.  Patient's presentation is most consistent with acute presentation with potential threat to life or bodily function.  The patient is on the cardiac monitor to evaluate for evidence of arrhythmia and/or significant heart rate changes.  Laboratory results demonstrate normal WBC 5.4, normal electrolytes, respiratory panel is negative.  Chest x-ray positive for right middle lobe pneumonia.  Will check blood cultures, lactic acid.  Initiate IV fluid hydration, IV Rocephin/Zithromax, Tussionex for cough.  Will reassess.  Clinical Course as of 09/24/23 0644  Mon Sep 24, 2023  0240 Lactic acid negative.  IV antibiotics infusing.  Patient resting in no acute distress.  Will continue to monitor and care for patient. [JS]  0400 IV antibiotics almost completed.  Will discharge home on oral antibiotics, Tussionex and patient will follow-up with her PCP.  Strict return precautions given.  Patient family member verbalized understanding and agree with plan of care. [JS]    Clinical Course User Index [JS] Irean Hong, MD     FINAL CLINICAL IMPRESSION(S) / ED DIAGNOSES   Final diagnoses:  Community acquired pneumonia, unspecified laterality     Rx / DC Orders   ED Discharge Orders          Ordered    amoxicillin-clavulanate (AUGMENTIN) 875-125 MG tablet  2 times daily        09/24/23 0400     azithromycin (ZITHROMAX) 250 MG tablet  Daily        09/24/23 0400    chlorpheniramine-HYDROcodone (TUSSIONEX) 10-8 MG/5ML  Every 12 hours PRN        09/24/23 0400             Note:  This document was prepared using Dragon voice recognition software and may include unintentional dictation errors.   Irean Hong, MD 09/24/23 [phone]

## 2023-09-24 NOTE — Discharge Instructions (Signed)
Take and finish antibiotics as prescribed (Augmentin/Azithromycin).  You may take Tussionex as needed for cough.  Return to the ER for worsening symptoms, persistent vomiting, difficulty breathing or other concerns.

## 2023-09-25 ENCOUNTER — Other Ambulatory Visit: Payer: Self-pay

## 2023-09-25 NOTE — Congregational Nurse Program (Signed)
  Dept: [phone]   Congregational Nurse Program Note  Date of Encounter: 09/25/2023 Client to [facility] day center with request for assistance in obtaining medications she was prescribed at a visit to the [facility] ER on 10/14. Client was diagnosed with community acquired pneumonia and given prescriptions for Augmentin and Zithromax, but was unable to get them filled as she was discharged at 4 am. Client does have Medicaid, but is unable to afford the 4.00 copay. RN arranged for the prescriptions to be filled at the [facility] pharmacy and will pick them up for the client. Client agreed to return to [facility] on 10/16 for her prescriptions. She also agreed to RN assistance in making her another appointment at [facility] infectious disease clinic in [place]. Support given. Francesco Runner BSN, RN  Past Medical History: Past Medical History:  Diagnosis Date   Asthma    Depression    HIV (human immunodeficiency virus infection) [facility])     Encounter Details:  Community Questionnaire - 09/25/23 1130       Questionnaire   Ask client: Do you give verbal consent for me to treat you today? Yes    Student Assistance N/A    Location Patient Served  [facility]    Encounter Setting CN site    Population Status Unhoused    Insurance Medicaid    Insurance/Financial Assistance Referral N/A    Medication Have Medication Insecurities    Medical Provider No    Screening Referrals Made N/A    Medical Referrals Made N/A    Medical Appointment Completed N/A    CNP Interventions Advocate/Support;Navigate Healthcare System;Reviewed Medications    Screenings CN Performed N/A    ED Visit Averted N/A    Life-Saving Intervention Made N/A

## 2023-09-26 ENCOUNTER — Other Ambulatory Visit: Payer: Self-pay

## 2023-09-26 MED ORDER — HYDROCOD POLI-CHLORPHE POLI ER 10-8 MG/5ML PO SUER
5.0000 mL | Freq: Two times a day (BID) | ORAL | 0 refills | Status: DC | PRN
Start: 2023-09-24 — End: 2023-11-04

## 2023-09-26 NOTE — Congregational Nurse Program (Signed)
  Dept: [phone]   Congregational Nurse Program Note  Date of Encounter: 09/26/2023 Client to [facility] day center to pick up the antibiotics that this RN was able to pick up for her yesterday at the [facility] pharmacy. Medications reviewed, patient voiced understanding. She continues to feel poorly and now has a productive cough. She was able to sleep at a friends house last night and not outside.  RN was able to set an appointment for her at the [facility] Infectious disease clinic for 10/25 at 12 pm. RN to assist with setting up her Medicaid transportation. RN encouraged client to return to the [facility] ER if her symptoms worsened. She voiced understanding. Madolyn Frieze Past Medical History: Past Medical History:  Diagnosis Date   Asthma    Depression    HIV (human immunodeficiency virus infection) [facility])     Encounter Details:  Community Questionnaire - 09/26/23 1248       Questionnaire   Ask client: Do you give verbal consent for me to treat you today? Yes    Student Assistance N/A    Location Patient Served  [facility]    Encounter Setting CN site    Population Status Unhoused    Insurance Medicaid    Insurance/Financial Assistance Referral N/A    Medication Have Medication Insecurities    Medical Provider No    Screening Referrals Made N/A    Medical Referrals Made N/A    Medical Appointment Completed Non-[facility] PCP/Clinic    CNP Interventions Advocate/Support;Navigate Healthcare System;Reviewed Medications    Screenings CN Performed Blood Pressure    ED Visit Averted N/A    Life-Saving Intervention Made N/A

## 2023-09-29 LAB — CULTURE, BLOOD (ROUTINE X 2)
Culture: NO GROWTH
Culture: NO GROWTH
Special Requests: ADEQUATE

## 2023-10-02 NOTE — Congregational Nurse Program (Signed)
  Dept: [phone]   Congregational Nurse Program Note  Date of Encounter: 10/02/2023 Client to [facility] day center nurse led clinic for assistance with setting up her Medicaid transportation to her upcoming [facility] infectious disease appointment on 10/25 at 12 pm. Transportation arranged, client is aware she will need to be at [address] on Friday 10/25 at 10:50. Return trip arranged for 2:30 pick up at [facility]. Client and her significant other both aware of all times. Client does not currently have a working phone. This was stressed to the agent at [facility] health when transportation arrangements were made. Francesco Runner BSN, RN Past Medical History: Past Medical History:  Diagnosis Date   Asthma    Depression    HIV (human immunodeficiency virus infection) [facility])     Encounter Details:  Community Questionnaire - 10/02/23 1115       Questionnaire   Ask client: Do you give verbal consent for me to treat you today? Yes    Student Assistance N/A    Location Patient Served  [facility]    Encounter Setting CN site    Population Status Unhoused    Insurance Medicaid    Insurance/Financial Assistance Referral N/A    Medication Have Medication Insecurities    Medical Provider No    Screening Referrals Made N/A    Medical Referrals Made N/A    Medical Appointment Completed Non-[facility] PCP/Clinic    CNP Interventions Advocate/Support;Navigate Healthcare System;Case Management    Screenings CN Performed N/A    ED Visit Averted N/A    Life-Saving Intervention Made N/A

## 2023-10-11 NOTE — Congregational Nurse Program (Signed)
  Dept: [phone]   Congregational Nurse Program Note  Date of Encounter: 10/11/2023 Client to [facility] day center nurse only clinic for assistance in arranging her Medicaid transportation for her upcoming appointment at [facility] infectious disease clinic on 11/5 at 1:00 pm. RN contacted [employer] through client's [facility]. Client to be picked up here at the center an hour prior to her appointment. Arrangements also made for a return trip at 3:30. Client given written information. Francesco Runner BSN, RN Past Medical History: Past Medical History:  Diagnosis Date   Asthma    Depression    HIV (human immunodeficiency virus infection) [facility])     Encounter Details:  Community Questionnaire - 10/11/23 1155       Questionnaire   Ask client: Do you give verbal consent for me to treat you today? Yes    Student Assistance N/A    Location Patient Served  [facility]    Encounter Setting CN site    Population Status Unhoused    Insurance Medicaid    Insurance/Financial Assistance Referral N/A    Medication Have Medication Insecurities    Medical Provider No    Screening Referrals Made N/A    Medical Referrals Made N/A    Medical Appointment Completed N/A    CNP Interventions Advocate/Support;Navigate Healthcare System;Case Management    Screenings CN Performed N/A    ED Visit Averted N/A    Life-Saving Intervention Made N/A

## 2023-11-04 ENCOUNTER — Other Ambulatory Visit: Payer: Self-pay

## 2023-11-04 ENCOUNTER — Encounter: Payer: Self-pay | Admitting: Emergency Medicine

## 2023-11-04 ENCOUNTER — Emergency Department
Admission: EM | Admit: 2023-11-04 | Discharge: 2023-11-04 | Disposition: A | Discharge status: Home / Self Care | Payer: MEDICAID | Attending: Emergency Medicine | Admitting: Emergency Medicine

## 2023-11-04 ENCOUNTER — Emergency Department: Payer: MEDICAID

## 2023-11-04 DIAGNOSIS — Z20822 Contact with and (suspected) exposure to covid-19: Secondary | ICD-10-CM | POA: Diagnosis not present

## 2023-11-04 DIAGNOSIS — B9689 Other specified bacterial agents as the cause of diseases classified elsewhere: Secondary | ICD-10-CM | POA: Insufficient documentation

## 2023-11-04 DIAGNOSIS — J45909 Unspecified asthma, uncomplicated: Secondary | ICD-10-CM | POA: Diagnosis not present

## 2023-11-04 DIAGNOSIS — R4182 Altered mental status, unspecified: Secondary | ICD-10-CM | POA: Insufficient documentation

## 2023-11-04 DIAGNOSIS — N390 Urinary tract infection, site not specified: Secondary | ICD-10-CM

## 2023-11-04 DIAGNOSIS — J329 Chronic sinusitis, unspecified: Secondary | ICD-10-CM | POA: Diagnosis not present

## 2023-11-04 DIAGNOSIS — R0602 Shortness of breath: Secondary | ICD-10-CM | POA: Diagnosis present

## 2023-11-04 DIAGNOSIS — Z21 Asymptomatic human immunodeficiency virus [HIV] infection status: Secondary | ICD-10-CM | POA: Insufficient documentation

## 2023-11-04 DIAGNOSIS — F1410 Cocaine abuse, uncomplicated: Secondary | ICD-10-CM

## 2023-11-04 DIAGNOSIS — R509 Fever, unspecified: Secondary | ICD-10-CM

## 2023-11-04 LAB — COMPREHENSIVE METABOLIC PANEL
ALT: 14 U/L (ref 0–44)
AST: 21 U/L (ref 15–41)
Albumin: 3 g/dL — ABNORMAL LOW (ref 3.5–5.0)
Alkaline Phosphatase: 70 U/L (ref 38–126)
Anion gap: 10 (ref 5–15)
BUN: 15 mg/dL (ref 6–20)
CO2: 23 mmol/L (ref 22–32)
Calcium: 8.6 mg/dL — ABNORMAL LOW (ref 8.9–10.3)
Chloride: 112 mmol/L — ABNORMAL HIGH (ref 98–111)
Creatinine, Ser: 1.08 mg/dL — ABNORMAL HIGH (ref 0.44–1.00)
GFR, Estimated: >60 mL/min (ref >60)
Glucose, Bld: 103 mg/dL — ABNORMAL HIGH (ref 70–99)
Potassium: 3.4 mmol/L — ABNORMAL LOW (ref 3.5–5.1)
Sodium: 145 mmol/L (ref 135–145)
Total Bilirubin: 0.4 mg/dL (ref <1.2)
Total Protein: 8.2 g/dL — ABNORMAL HIGH (ref 6.5–8.1)

## 2023-11-04 LAB — D-DIMER, QUANTITATIVE: D-Dimer, Quant: 1.49 ug{FEU}/mL — ABNORMAL HIGH (ref 0.00–0.50)

## 2023-11-04 LAB — URINALYSIS, ROUTINE W REFLEX MICROSCOPIC
Bilirubin Urine: NEGATIVE
Glucose, UA: NEGATIVE mg/dL
Hgb urine dipstick: NEGATIVE
Ketones, ur: NEGATIVE mg/dL
Nitrite: NEGATIVE
Protein, ur: 30 mg/dL — AB
Specific Gravity, Urine: 1.018 (ref 1.005–1.030)
WBC, UA: >50 WBC/hpf (ref 0–5)
pH: 5 (ref 5.0–8.0)

## 2023-11-04 LAB — URINE DRUG SCREEN, QUALITATIVE (ARMC ONLY)
Amphetamines, Ur Screen: NOT DETECTED
Barbiturates, Ur Screen: NOT DETECTED
Benzodiazepine, Ur Scrn: NOT DETECTED
Cannabinoid 50 Ng, Ur ~~LOC~~: NOT DETECTED
Cocaine Metabolite,Ur ~~LOC~~: POSITIVE — AB
MDMA (Ecstasy)Ur Screen: NOT DETECTED
Methadone Scn, Ur: NOT DETECTED
Opiate, Ur Screen: NOT DETECTED
Phencyclidine (PCP) Ur S: NOT DETECTED
Tricyclic, Ur Screen: NOT DETECTED

## 2023-11-04 LAB — RESP PANEL BY RT-PCR (RSV, FLU A&B, COVID)  RVPGX2
Influenza A by PCR: NEGATIVE
Influenza B by PCR: NEGATIVE
Resp Syncytial Virus by PCR: NEGATIVE
SARS Coronavirus 2 by RT PCR: NEGATIVE

## 2023-11-04 LAB — CBG MONITORING, ED: Glucose-Capillary: 91 mg/dL (ref 70–99)

## 2023-11-04 LAB — CBC
HCT: 34.3 % — ABNORMAL LOW (ref 36.0–46.0)
Hemoglobin: 10.9 g/dL — ABNORMAL LOW (ref 12.0–15.0)
MCH: 26.8 pg (ref 26.0–34.0)
MCHC: 31.8 g/dL (ref 30.0–36.0)
MCV: 84.3 fL (ref 80.0–100.0)
Platelets: 144 10*3/uL — ABNORMAL LOW (ref 150–400)
RBC: 4.07 MIL/uL (ref 3.87–5.11)
RDW: 14.5 % (ref 11.5–15.5)
WBC: 3.8 10*3/uL — ABNORMAL LOW (ref 4.0–10.5)
nRBC: 0 % (ref 0.0–0.2)

## 2023-11-04 LAB — CK: Total CK: 134 U/L (ref 38–234)

## 2023-11-04 LAB — PROCALCITONIN: Procalcitonin: <0.1 ng/mL

## 2023-11-04 LAB — LACTIC ACID, PLASMA: Lactic Acid, Venous: 1.1 mmol/L (ref 0.5–1.9)

## 2023-11-04 LAB — TROPONIN I (HIGH SENSITIVITY): Troponin I (High Sensitivity): 6 ng/L (ref <18)

## 2023-11-04 MED ORDER — IBUPROFEN 800 MG PO TABS
800.0000 mg | ORAL_TABLET | Freq: Once | ORAL | Status: AC
  Administered 2023-11-04: 800 mg via ORAL
  Filled 2023-11-04: qty 1

## 2023-11-04 MED ORDER — SODIUM CHLORIDE 0.9 % IV SOLN
2.0000 g | Freq: Once | INTRAVENOUS | Status: AC
  Administered 2023-11-04: 2 g via INTRAVENOUS
  Filled 2023-11-04: qty 20

## 2023-11-04 MED ORDER — ACETAMINOPHEN 325 MG PO TABS
650.0000 mg | ORAL_TABLET | Freq: Once | ORAL | Status: AC
  Administered 2023-11-04: 650 mg via ORAL
  Filled 2023-11-04: qty 2

## 2023-11-04 MED ORDER — AZITHROMYCIN 250 MG PO TABS: ORAL_TABLET | ORAL | 0 refills | Status: AC

## 2023-11-04 MED ORDER — IOHEXOL 350 MG/ML SOLN
75.0000 mL | Freq: Once | INTRAVENOUS | Status: AC | PRN
  Administered 2023-11-04: 75 mL via INTRAVENOUS

## 2023-11-04 MED ORDER — CEPHALEXIN 500 MG PO CAPS: 500.0000 mg | ORAL_CAPSULE | Freq: Two times a day (BID) | ORAL | 0 refills | Status: DC

## 2023-11-04 MED ORDER — SODIUM CHLORIDE 0.9 % IV SOLN
500.0000 mg | Freq: Once | INTRAVENOUS | Status: AC
  Administered 2023-11-04: 500 mg via INTRAVENOUS
  Filled 2023-11-04: qty 5

## 2023-11-04 MED ORDER — SODIUM CHLORIDE 0.9 % IV BOLUS (SEPSIS)
1000.0000 mL | Freq: Once | INTRAVENOUS | Status: AC
  Administered 2023-11-04: 1000 mL via INTRAVENOUS

## 2023-11-04 NOTE — ED Provider Notes (Signed)
[facility] Provider Note    Event Date/Time   First MD Initiated Contact with Patient 11/04/23 [phone]     (approximate)   History   Cough and Generalized Body Aches   HPI  Meghan Jarvis is a 47 y.o. female with HIV, depression, asthma, homelessness who presents to the emergency department with 2 weeks of cough, shortness of breath.  Reports diffuse bodyaches as well.  No nausea, vomiting or diarrhea.  No dysuria.  Found to be febrile here.  Reports she has not been on her medications for HIV in "a long time".   History provided by patient.    Past Medical History:  Diagnosis Date   Asthma    Depression    HIV (human immunodeficiency virus infection) (HCC)     Past Surgical History:  Procedure Laterality Date   ABDOMINAL HYSTERECTOMY     APPENDECTOMY      MEDICATIONS:  Prior to Admission medications   Medication Sig Start Date End Date Taking? Authorizing Provider  amoxicillin-clavulanate (AUGMENTIN) 875-125 MG tablet Take 1 tablet by mouth 2 (two) times daily. 09/24/23   Irean Hong, MD  azithromycin (ZITHROMAX) 250 MG tablet Take 1 tablet (250 mg total) by mouth daily. 09/24/23   Irean Hong, MD  bictegravir-emtricitabine-tenofovir AF (BIKTARVY) 50-200-25 MG TABS tablet Take 1 tablet by mouth daily.    [provider]  chlorpheniramine-HYDROcodone (TUSSIONEX) 10-8 MG/5ML Take 5 mLs by mouth every 12 (twelve) hours as needed for cough. 09/24/23   Irean Hong, MD  chlorpheniramine-HYDROcodone (TUSSIONEX) 10-8 MG/5ML Take 5 mLs by mouth every 12 (twelve) hours as needed for cough 09/24/23   Irean Hong, MD  DULoxetine (CYMBALTA) 30 MG capsule Take 90 mg by mouth daily.    [provider]  gabapentin (NEURONTIN) 300 MG capsule Take 300 mg by mouth 3 (three) times daily.    [provider]  ondansetron (ZOFRAN-ODT) 4 MG disintegrating tablet Take 1 tablet (4 mg total) by mouth every 8 (eight) hours as needed for nausea  or vomiting. 03/20/23   Jene Every, MD  polycarbophil (FIBERCON) 625 MG tablet Take 625 mg by mouth daily.    [provider]    Physical Exam   Triage Vital Signs: ED Triage Vitals  Encounter Vitals Group     BP 11/04/23 0210 104/77     Systolic BP Percentile --      Diastolic BP Percentile --      Pulse Rate 11/04/23 0210 (!) 107     Resp 11/04/23 0210 16     Temp 11/04/23 0210 (!) 100.4 F (38 C)     Temp Source 11/04/23 0210 Oral     SpO2 11/04/23 0210 95 %     Weight 11/04/23 0212 108 lb 0.4 oz (49 kg)     Height 11/04/23 0212 5\' 8"  (1.727 m)     Head Circumference --      Peak Flow --      Pain Score 11/04/23 0211 10     Pain Loc --      Pain Education --      Exclude from Growth Chart --     Most recent vital signs: Vitals:   11/04/23 0500 11/04/23 0533  BP: 104/69   Pulse: 75 72  Resp: 16   Temp: 98.6 F (37 C)   SpO2: 97% 97%    CONSTITUTIONAL: Alert, responds appropriately to questions.  Malnourished, thin.  Repeatedly stating that she  cannot breathe. HEAD: Normocephalic, atraumatic EYES: Conjunctivae clear, pupils appear equal, sclera nonicteric ENT: normal nose; moist mucous membranes NECK: Supple, normal ROM CARD: Regular tachycardic; S1 and S2 appreciated RESP: Normal chest excursion without splinting or tachypnea; breath sounds clear and equal bilaterally; no wheezes, no rhonchi, no rales, no hypoxia or respiratory distress, speaking full sentences ABD/GI: Non-distended; soft, non-tender, no rebound, no guarding, no peritoneal signs BACK: The back appears normal EXT: Normal ROM in all joints; no deformity noted, no edema, no calf tenderness or calf swelling SKIN: Normal color for age and race; warm; no rash on exposed skin NEURO: Moves all extremities equally, normal speech, no facial asymmetry, patient is drowsy but arousable, she is able to ambulate without difficulty PSYCH: The patient's mood and manner are appropriate.   ED Results /  Procedures / Treatments   LABS: (all labs ordered are listed, but only abnormal results are displayed) Labs Reviewed  COMPREHENSIVE METABOLIC PANEL - Abnormal; Notable for the following components:      Result Value   Potassium 3.4 (*)    Chloride 112 (*)    Glucose, Bld 103 (*)    Creatinine, Ser 1.08 (*)    Calcium 8.6 (*)    Total Protein 8.2 (*)    Albumin 3.0 (*)    All other components within normal limits  CBC - Abnormal; Notable for the following components:   WBC 3.8 (*)    Hemoglobin 10.9 (*)    HCT 34.3 (*)    Platelets 144 (*)    All other components within normal limits  URINALYSIS, ROUTINE W REFLEX MICROSCOPIC - Abnormal; Notable for the following components:   Color, Urine YELLOW (*)    APPearance CLOUDY (*)    Protein, ur 30 (*)    Leukocytes,Ua MODERATE (*)    Bacteria, UA RARE (*)    All other components within normal limits  D-DIMER, QUANTITATIVE - Abnormal; Notable for the following components:   D-Dimer, Quant 1.49 (*)    All other components within normal limits  URINE DRUG SCREEN, QUALITATIVE ([facility] ONLY) - Abnormal; Notable for the following components:   Cocaine Metabolite,Ur [place] POSITIVE (*)    All other components within normal limits  RESP PANEL BY RT-PCR (RSV, FLU A&B, COVID)  [id number]  URINE CULTURE  LACTIC ACID, PLASMA  CK  PROCALCITONIN  CBG MONITORING, ED  TROPONIN I (HIGH SENSITIVITY)     EKG:  EKG Interpretation Date/Time:  Sunday November 04 2023 03:26:13 EST Ventricular Rate:  86 PR Interval:    QRS Duration:  72 QT Interval:  334 QTC Calculation: 399 R Axis:   101  Text Interpretation: Normal sinus rhythm Nonspecific ST abnormality Confirmed by Rochele Raring [phone]) on 11/04/2023 3:30:07 AM         RADIOLOGY: My personal review and interpretation of imaging: CTs show no acute abnormality.  I have personally reviewed all radiology reports.   CT Angio Chest PE W and/or Wo Contrast  Result Date: 11/04/2023 CLINICAL  DATA:  46 year old female HIV positive. Altered mental status, hyperglycemia, cough for 2 weeks, leg pain EXAM: CT ANGIOGRAPHY CHEST WITH CONTRAST TECHNIQUE: Multidetector CT imaging of the chest was performed using the standard protocol during bolus administration of intravenous contrast. Multiplanar CT image reconstructions and MIPs were obtained to evaluate the vascular anatomy. RADIATION DOSE REDUCTION: This exam was performed according to the departmental dose-optimization program which includes automated exposure control, adjustment of the mA and/or kV according to patient size and/or use of  iterative reconstruction technique. CONTRAST:  75mL OMNIPAQUE IOHEXOL 350 MG/ML SOLN COMPARISON:  Chest CTA 03/20/2023 and earlier. FINDINGS: Cardiovascular: Excellent contrast bolus timing in the pulmonary arterial tree. No pulmonary artery filling defect. Calcified coronary artery atherosclerosis such as on series 5, image 199. Cardiac size is at the upper limits of normal. No evidence of pericardial effusion. Little contrast in the aorta, no obvious thoracic aortic dissection or aneurysm. Mediastinum/Nodes: Negative. No evidence of mediastinal mass or lymphadenopathy. Lungs/Pleura: Low lung volumes, lower than in April. Major airways are patent. Symmetric dependent lung opacity favored to be atelectasis. No pleural effusion. No consolidation. More confluent atelectasis in the bilateral medial middle lobes. Mild gas trapping also at the lung bases. Upper Abdomen: Negative visible noncontrast liver, gallbladder, spleen, pancreas, adrenal glands, kidneys, and bowel. Musculoskeletal: No acute or suspicious osseous lesion. Review of the MIP images confirms the above findings. IMPRESSION: 1. Negative for pulmonary embolus. 2. Lower lung volumes with fairly symmetric and dependent opacity most resembling atelectasis. 3. Calcified coronary artery atherosclerosis. Electronically Signed   By: Odessa Fleming M.D.   On: 11/04/2023 06:47    CT HEAD WO CONTRAST ( )  Result Date: 11/04/2023 CLINICAL DATA:  47 year old female HIV positive. Altered mental status, hyperglycemia, cough for 2 weeks, leg pain EXAM: CT HEAD WITHOUT CONTRAST TECHNIQUE: Contiguous axial images were obtained from the base of the skull through the vertex without intravenous contrast. RADIATION DOSE REDUCTION: This exam was performed according to the departmental dose-optimization program which includes automated exposure control, adjustment of the mA and/or kV according to patient size and/or use of iterative reconstruction technique. COMPARISON:  Head CT 07/12/2022. FINDINGS: Brain: Cerebral volume loss seems chronically advanced for age. No midline shift, ventriculomegaly, mass effect, evidence of mass lesion, intracranial hemorrhage or evidence of cortically based acute infarction. Questionable patchy chronic bilateral white matter hypodensity, stable. Otherwise maintained gray-white differentiation. No cortical encephalomalacia identified. Vascular: Chronic intracranial artery tortuosity. No suspicious intracranial vascular hyperdensity. Skull: Calvarium appears intact. No acute osseous abnormality identified. Sinuses/Orbits: Sinus disease has regressed since last year. There is a residual right maxillary sinus fluid and bubbly opacity. Tympanic cavities and mastoids are clear. Other: Resolved abnormal left face soft tissue gas seen last year. Orbit and scalp soft tissues now within normal limits. IMPRESSION: 1. No acute intracranial abnormality. 2. Suspicion of chronically age advanced generalized cerebral volume loss, might be sequelae of HIV in this setting. 3. Mild right maxillary sinus inflammation. Electronically Signed   By: Odessa Fleming M.D.   On: 11/04/2023 06:44   DG Chest 2 View  Result Date: 11/04/2023 CLINICAL DATA:  Cough EXAM: CHEST - 2 VIEW COMPARISON:  09/23/2023 FINDINGS: Lungs are well expanded, symmetric, and clear. No pneumothorax or pleural  effusion. Cardiac size within normal limits. Pulmonary vascularity is normal. Osseous structures are age-appropriate. No acute bone abnormality. IMPRESSION: No active cardiopulmonary disease. Electronically Signed   By: Helyn Numbers M.D.   On: 11/04/2023 03:01     PROCEDURES:  Critical Care performed: No     Procedures    IMPRESSION / MDM / ASSESSMENT AND PLAN / ED COURSE  I reviewed the triage vital signs and the nursing notes.    Patient here with fevers, cough, shortness of breath and bodyaches.    DIFFERENTIAL DIAGNOSIS (includes but not limited to):   Viral illness, pneumonia, UTI, ACS, PE, pneumothorax   Patient's presentation is most consistent with acute presentation with potential threat to life or bodily function.   PLAN: Will obtain  labs, chest x-ray, urine.  Given Tylenol in triage for fever.  Will give ibuprofen for continued body aches and IV fluids.  Will obtain COVID and flu swab.   MEDICATIONS GIVEN IN ED: Medications  azithromycin (ZITHROMAX) 500 mg in sodium chloride 0.9 % 250 mL IVPB (has no administration in time range)  acetaminophen (TYLENOL) tablet 650 mg (650 mg Oral Given 11/04/23 0219)  ibuprofen (ADVIL) tablet 800 mg (800 mg Oral Given 11/04/23 0330)  sodium chloride 0.9 % bolus 1,000 mL (0 mLs Intravenous Stopped 11/04/23 0533)  cefTRIAXone (ROCEPHIN) 2 g in sodium chloride 0.9 % 100 mL IVPB (2 g Intravenous New Bag/Given 11/04/23 0609)  iohexol (OMNIPAQUE) 350 MG/ML injection 75 mL (75 mLs Intravenous Contrast Given 11/04/23 0601)     ED COURSE: Patient's labs show mild leukopenia.  Lactic normal.  Troponin negative.  D-dimer elevated.  Will obtain CTA of the chest.  Chest x-ray reviewed and interpreted by myself and the radiologist and is unremarkable.  COVID, flu and RSV negative.  Patient is very drowsy now and difficult to arouse which is different than when she arrived.  She denies any drug or alcohol use.  Will add on urine drug screen  but also obtain head CT.  We were eventually able to get her up and ambulate to the bathroom with assistance to collect a urine sample.    6:40 AM Pt's urine is infected.  Will add on urine culture.  Previous urine cultures have grown E. coli sensitive to cephalosporins.  Will give Rocephin here.  Negative procalcitonin.  Heart rate has improved as fever has defervesced.  Patient continues to be hemodynamically stable.  Drug screen positive for cocaine.   6:55 AM  Pt's CT scans reviewed and interpreted by myself and the radiologist.  No acute intracranial abnormality.  Patient does have sinusitis.  Given cough and congestion, will cover with azithromycin.  She has already received Rocephin here for UTI.  CTA of the chest reviewed and interpreted by myself and the radiologist and shows no PE, pneumonia, edema.  Patient has no hypoxia, increased work of breathing or respiratory distress here.  She has been resting comfortably.  Will discharge once IV antibiotics completed and patient is clinically sober.    CONSULTS:  none   OUTSIDE RECORDS REVIEWED: Reviewed last infectious disease note with [name] in 2021.       FINAL CLINICAL IMPRESSION(S) / ED DIAGNOSES   Final diagnoses:  Fever in adult  Acute UTI  Cocaine abuse (HCC)  Other sinusitis, unspecified chronicity     Rx / DC Orders   ED Discharge Orders          Ordered    azithromycin (ZITHROMAX Z-PAK) 250 MG tablet        11/04/23 0655    cephALEXin (KEFLEX) 500 MG capsule  2 times daily        11/04/23 2536             Note:  This document was prepared using Dragon voice recognition software and may include unintentional dictation errors.   Harish Bram, Layla Maw, DO 11/04/23 [phone]

## 2023-11-04 NOTE — ED Triage Notes (Signed)
Pt BIB [facility]  Per EMS pt is homeless, trouble walking for past 2 weeks. Cough for 2 weeks. HIV + 130/90 CBG 215 HR 109 Pt reports back pain and leg pain for a while

## 2023-11-04 NOTE — Discharge Instructions (Addendum)
You may alternate Tylenol 1000 mg every 6 hours as needed for pain, fever and Ibuprofen 800 mg every 6-8 hours as needed for pain, fever.  Please take Ibuprofen with food.  Do not take more than 4000 mg of Tylenol (acetaminophen) in a 24 hour period.  I recommend close follow-up with infectious disease to manage her HIV.  Your workup today showed that you had a right-sided sinus infection and a urinary tract infection.  You have no sign of blood clot in your lungs, heart attack, pneumonia.  Please take your antibiotics until complete.  Please go to the following website to schedule new (and existing) patient appointments:   [email or web address]   The following is a list of primary care offices in the area who are accepting new patients at this time.  Please reach out to one of them directly and let them know you would like to schedule an appointment to follow up on an Emergency Department visit, and/or to establish a new primary care provider (PCP).  There are likely other primary care clinics in the are who are accepting new patients, but this is an excellent place to start:  [facility] Lead physician: Dr Shirlee Latch [address] [place], [place] [address] [phone]  [facility] Lead Physician: Dr Alba Cory [address], [place], [place] [address] [phone]  [facility]  Lead Physician: Dr Olevia Perches [address] [place], [place] [address] [phone]  [facility] Lead Physician: Dr Sofie Hartigan [address] [place], [place] [address] [phone]  [facility] at [facility] Lead Physician: Dr Bari Edward [address] [place], [place], [place] [address] [phone]

## 2023-11-04 NOTE — ED Notes (Addendum)
Ambulatory to toilet, unsteady gait. Very reluctant to engage in conversation. Appears intoxicated. Denies drug or ETOH use

## 2023-11-04 NOTE — ED Notes (Signed)
Pt to CT

## 2023-11-06 LAB — URINE CULTURE: Culture: >=100000 — AB

## 2023-11-07 NOTE — Progress Notes (Signed)
ED Antimicrobial Stewardship Positive Culture Follow Up   Meghan Jarvis is an 47 y.o. female who presented to [facility] on 11/04/2023 with a chief complaint of  Chief Complaint  Patient presents with   Cough   Generalized Body Aches   Recent Results (from the past 720 hour(s))  Urine Culture     Status: Abnormal   Collection Time: 11/04/23  2:16 AM   Specimen: Urine, Clean Catch  Result Value Ref Range Status   Specimen Description   Final    URINE, CLEAN CATCH Performed at [facility], [address], [place], [place] [address]    Special Requests   Final    NONE Performed at [facility], [address], [place], [place] [address]    Culture >=100,000 COLONIES/mL STAPHYLOCOCCUS EPIDERMIDIS (A)  Final   Report Status 11/06/2023 FINAL  Final   Organism ID, Bacteria STAPHYLOCOCCUS EPIDERMIDIS (A)  Final      Susceptibility   Staphylococcus epidermidis - MIC*    CIPROFLOXACIN <=0.5 SENSITIVE Sensitive     GENTAMICIN <=0.5 SENSITIVE Sensitive     NITROFURANTOIN <=16 SENSITIVE Sensitive     OXACILLIN >=4 RESISTANT Resistant     TETRACYCLINE <=1 SENSITIVE Sensitive     VANCOMYCIN 2 SENSITIVE Sensitive     TRIMETH/SULFA 160 RESISTANT Resistant     CLINDAMYCIN >=8 RESISTANT Resistant     RIFAMPIN <=0.5 SENSITIVE Sensitive     Inducible Clindamycin NEGATIVE Sensitive     * >=100,000 COLONIES/mL STAPHYLOCOCCUS EPIDERMIDIS  Resp panel by RT-PCR (RSV, Flu A&B, Covid) Anterior Nasal Swab     Status: None   Collection Time: 11/04/23  2:54 AM   Specimen: Anterior Nasal Swab  Result Value Ref Range Status   SARS Coronavirus 2 by RT PCR NEGATIVE NEGATIVE Final    Comment: (NOTE) SARS-CoV-2 target nucleic acids are NOT DETECTED.  The SARS-CoV-2 RNA is generally detectable in upper respiratory specimens during the acute phase of infection. The lowest concentration of SARS-CoV-2 viral copies this assay can detect is 138 copies/mL. A negative result does not  preclude SARS-Cov-2 infection and should not be used as the sole basis for treatment or other patient management decisions. A negative result may occur with  improper specimen collection/handling, submission of specimen other than nasopharyngeal swab, presence of viral mutation(s) within the areas targeted by this assay, and inadequate number of viral copies(<138 copies/mL). A negative result must be combined with clinical observations, patient history, and epidemiological information. The expected result is Negative.  Fact Sheet for Patients:  BloggerCourse.com  Fact Sheet for Healthcare Providers:  SeriousBroker.it  This test is no t yet approved or cleared by the [place] FDA and  has been authorized for detection and/or diagnosis of SARS-CoV-2 by FDA under an Emergency Use Authorization (EUA). This EUA will remain  in effect (meaning this test can be used) for the duration of the COVID-19 declaration under Section 564(b)(1) of the Act, 21 U.S.C.section 360bbb-3(b)(1), unless the authorization is terminated  or revoked sooner.       Influenza A by PCR NEGATIVE NEGATIVE Final   Influenza B by PCR NEGATIVE NEGATIVE Final    Comment: (NOTE) The Xpert Xpress SARS-CoV-2/FLU/RSV plus assay is intended as an aid in the diagnosis of influenza from Nasopharyngeal swab specimens and should not be used as a sole basis for treatment. Nasal washings and aspirates are unacceptable for Xpert Xpress SARS-CoV-2/FLU/RSV testing.  Fact Sheet for Patients: BloggerCourse.com  Fact Sheet for Healthcare Providers: SeriousBroker.it  This test is not yet approved or cleared by the [place] and has been authorized for detection and/or diagnosis of SARS-CoV-2 by FDA under an Emergency Use Authorization (EUA). This EUA will remain in effect (meaning this test can be used) for the  duration of the COVID-19 declaration under Section 564(b)(1) of the Act, 21 U.S.C. section 360bbb-3(b)(1), unless the authorization is terminated or revoked.     Resp Syncytial Virus by PCR NEGATIVE NEGATIVE Final    Comment: (NOTE) Fact Sheet for Patients: BloggerCourse.com  Fact Sheet for Healthcare Providers: SeriousBroker.it  This test is not yet approved or cleared by the [place] FDA and has been authorized for detection and/or diagnosis of SARS-CoV-2 by FDA under an Emergency Use Authorization (EUA). This EUA will remain in effect (meaning this test can be used) for the duration of the COVID-19 declaration under Section 564(b)(1) of the Act, 21 U.S.C. section 360bbb-3(b)(1), unless the authorization is terminated or revoked.  Performed at [facility], [address] [place], [place] [address]    [x]  Treated with cephalexin, organism resistant to prescribed antimicrobial []  Patient discharged originally without antimicrobial agent and treatment is now indicated  ED Provider: Dr. Rosalia Hammers  Comments: Unable to reach patient for change in antibiotics. Patient is homeless and unable to be reached at phone number in chart. Spoke with [facility] pharmacy that original cephalexin script was sent to and patient has not picked it up so unlikely to fill a new script.   Littie Deeds, PharmD Pharmacy Resident  11/07/2023 10:35 AM

## 2023-11-13 ENCOUNTER — Telehealth: Payer: Self-pay | Admitting: Infectious Diseases

## 2023-11-13 NOTE — Telephone Encounter (Signed)
Called patient didn't get an answer.

## 2023-11-28 ENCOUNTER — Other Ambulatory Visit: Payer: Self-pay

## 2023-11-28 ENCOUNTER — Emergency Department: Payer: MEDICAID

## 2023-11-28 ENCOUNTER — Emergency Department
Admission: EM | Admit: 2023-11-28 | Discharge: 2023-11-30 | Disposition: A | Discharge status: Home / Self Care | Payer: MEDICAID | Attending: Emergency Medicine | Admitting: Emergency Medicine

## 2023-11-28 DIAGNOSIS — R519 Headache, unspecified: Secondary | ICD-10-CM | POA: Diagnosis not present

## 2023-11-28 DIAGNOSIS — F1414 Cocaine abuse with cocaine-induced mood disorder: Secondary | ICD-10-CM | POA: Insufficient documentation

## 2023-11-28 DIAGNOSIS — F1020 Alcohol dependence, uncomplicated: Secondary | ICD-10-CM | POA: Diagnosis not present

## 2023-11-28 DIAGNOSIS — Z21 Asymptomatic human immunodeficiency virus [HIV] infection status: Secondary | ICD-10-CM | POA: Diagnosis present

## 2023-11-28 DIAGNOSIS — Z5900 Homelessness unspecified: Secondary | ICD-10-CM | POA: Diagnosis not present

## 2023-11-28 DIAGNOSIS — F1994 Other psychoactive substance use, unspecified with psychoactive substance-induced mood disorder: Secondary | ICD-10-CM

## 2023-11-28 DIAGNOSIS — F1490 Cocaine use, unspecified, uncomplicated: Secondary | ICD-10-CM

## 2023-11-28 LAB — CBC WITH DIFFERENTIAL/PLATELET
Abs Immature Granulocytes: 0 10*3/uL (ref 0.00–0.07)
Basophils Absolute: 0 10*3/uL (ref 0.0–0.1)
Basophils Relative: 1 %
Eosinophils Absolute: 0.5 10*3/uL (ref 0.0–0.5)
Eosinophils Relative: 13 %
HCT: 34.3 % — ABNORMAL LOW (ref 36.0–46.0)
Hemoglobin: 10.6 g/dL — ABNORMAL LOW (ref 12.0–15.0)
Immature Granulocytes: 0 %
Lymphocytes Relative: 19 %
Lymphs Abs: 0.8 10*3/uL (ref 0.7–4.0)
MCH: 27.4 pg (ref 26.0–34.0)
MCHC: 30.9 g/dL (ref 30.0–36.0)
MCV: 88.6 fL (ref 80.0–100.0)
Monocytes Absolute: 0.5 10*3/uL (ref 0.1–1.0)
Monocytes Relative: 11 %
Neutro Abs: 2.4 10*3/uL (ref 1.7–7.7)
Neutrophils Relative %: 56 %
Platelets: 172 10*3/uL (ref 150–400)
RBC: 3.87 MIL/uL (ref 3.87–5.11)
RDW: 15.1 % (ref 11.5–15.5)
WBC: 4.2 10*3/uL (ref 4.0–10.5)
nRBC: 0 % (ref 0.0–0.2)

## 2023-11-28 LAB — BASIC METABOLIC PANEL
Anion gap: 7 (ref 5–15)
BUN: 12 mg/dL (ref 6–20)
CO2: 24 mmol/L (ref 22–32)
Calcium: 8.6 mg/dL — ABNORMAL LOW (ref 8.9–10.3)
Chloride: 113 mmol/L — ABNORMAL HIGH (ref 98–111)
Creatinine, Ser: 0.95 mg/dL (ref 0.44–1.00)
GFR, Estimated: >60 mL/min (ref >60)
Glucose, Bld: 91 mg/dL (ref 70–99)
Potassium: 3 mmol/L — ABNORMAL LOW (ref 3.5–5.1)
Sodium: 144 mmol/L (ref 135–145)

## 2023-11-28 LAB — CK: Total CK: 145 U/L (ref 38–234)

## 2023-11-28 LAB — TROPONIN I (HIGH SENSITIVITY): Troponin I (High Sensitivity): 4 ng/L (ref <18)

## 2023-11-28 MED ORDER — IBUPROFEN 600 MG PO TABS
600.0000 mg | ORAL_TABLET | Freq: Three times a day (TID) | ORAL | Status: DC | PRN
  Administered 2023-11-29: 600 mg via ORAL
  Filled 2023-11-28: qty 1

## 2023-11-28 MED ORDER — ALUM & MAG HYDROXIDE-SIMETH 200-200-20 MG/5ML PO SUSP: 30.0000 mL | Freq: Four times a day (QID) | ORAL | Status: DC | PRN

## 2023-11-28 MED ORDER — ONDANSETRON HCL 4 MG PO TABS: 4.0000 mg | ORAL_TABLET | Freq: Three times a day (TID) | ORAL | Status: DC | PRN

## 2023-11-28 NOTE — BH Assessment (Incomplete)
Comprehensive Clinical Assessment (CCA) Screening, Triage and Referral Note  11/28/2023 NICKOLAS SHOEMATE [id number] Recommendations for Services/Supports/Treatments: Consulted with Rashaun D., NP, who recommended pt. for continued observation and reassessment in the AM.   [name] is a 47 year old, English speaking, Black female with a hx of alcohol use disorder, and cannabis use d/o. Per triage note: Pt presents to ED under IVC by [employer] under the influence of crack cocaine yesterday and ETOH today. IVC paperwork states pt is unsteady and not able to take care of herself. Pt is homeless. Pt states she ate today but unsure of what and what time. Pt c/o of chronic back pain. Pt does appear under the influence of some substance. Pt did fall and hit head, no LOC, fall witnessed by [employer].     On assessment, pt. presented with slurred, garbled speech. When asked what brought pt. to the ED the pt. continuously fell asleep. Pt mumbled, "I fell".  Pt denied SI/HI. Pt had absent insight. Pt had an irritable mood and a congruent affect. Due to extreme drowsiness, Pt continued to have poor concentration and she stopped answering assessment questions.   Chief Complaint: No chief complaint on file.  Visit Diagnosis: Cocaine abuse with cocaine-induced mood disorder (HCC) Active Problems:   Alcohol use disorder, severe, dependence (HCC)   HIV (human immunodeficiency virus infection) (HCC)   Patient Reported Information How did you hear about [place]? No data recorded What Is the Reason for Your Visit/Call Today? No data recorded How Long Has This Been Causing You Problems? No data recorded What Do You Feel Would Help You the Most Today? No data recorded  Have You Recently Had Any Thoughts About Hurting Yourself? No data recorded Are You Planning to Commit Suicide/Harm Yourself At This time? No data recorded  Have you Recently Had Thoughts About Hurting Someone Karolee Ohs? No data recorded Are You Planning to  Harm Someone at This Time? No data recorded Explanation: No data recorded  Have You Used Any Alcohol or Drugs in the Past 24 Hours? No data recorded How Long Ago Did You Use Drugs or Alcohol? No data recorded What Did You Use and How Much? No data recorded  Do You Currently Have a Therapist/Psychiatrist? No data recorded Name of Therapist/Psychiatrist: No data recorded  Have You Been Recently Discharged From Any Office Practice or Programs? No data recorded Explanation of Discharge From Practice/Program: No data recorded   CCA Screening Triage Referral Assessment Type of Contact: No data recorded Telemedicine Service Delivery:   Is this Initial or Reassessment?   Date Telepsych consult ordered in [facility]:    Time Telepsych consult ordered in [facility]:    Location of Assessment: No data recorded Provider Location: No data recorded   Collateral Involvement: No data recorded  Does Patient Have a Court Appointed Legal Guardian? No data recorded Name and Contact of Legal Guardian: No data recorded If Minor and Not Living with Parent(s), Who has Custody? No data recorded Is CPS involved or ever been involved? No data recorded Is APS involved or ever been involved? No data recorded  Patient Determined To Be At Risk for Harm To Self or Others Based on Review of Patient Reported Information or Presenting Complaint? No data recorded Method: No data recorded Availability of Means: No data recorded Intent: No data recorded Notification Required: No data recorded Additional Information for Danger to Others Potential: No data recorded Additional Comments for Danger to Others Potential: No data recorded Are There Guns or Other  Weapons in Your Home? No data recorded Types of Guns/Weapons: No data recorded Are These Weapons Safely Secured?                            No data recorded Who Could Verify You Are Able To Have These Secured: No data recorded Do You Have any Outstanding Charges, Pending Court  Dates, Parole/Probation? No data recorded Contacted To Inform of Risk of Harm To Self or Others: No data recorded  Does Patient Present under Involuntary Commitment? No data recorded   [place] of Residence: No data recorded  Patient Currently Receiving the Following Services: No data recorded  Determination of Need: No data recorded  Options For Referral: No data recorded  Discharge Disposition:     Glenisha Gundry R Zeriah Baysinger, LCAS

## 2023-11-28 NOTE — ED Notes (Signed)
This tech obtained vital signs on pt.  

## 2023-11-28 NOTE — ED Notes (Signed)
IVC 

## 2023-11-28 NOTE — ED Notes (Signed)
Pt provided phone to use. Pt ambulatory without difficulty at this time.

## 2023-11-28 NOTE — ED Notes (Signed)
Per dr. Scotty Court, d/c repeat troponin

## 2023-11-28 NOTE — ED Notes (Signed)
Pt transported to CT with officer 

## 2023-11-28 NOTE — Consult Note (Signed)
[facility] Psychiatric Consult Initial  Patient Name: [name]  MRN: [id number]  DOB: July 31, 1976  Consult Order details:  Orders (From admission, onward)     Start     Ordered   11/28/23 1706  CONSULT TO CALL ACT TEAM       Ordering Provider: Sharman Cheek, MD  Provider:  (Not yet assigned)  Question:  Reason for Consult?  Answer:  Psych consult   11/28/23 1706   11/28/23 1706  IP CONSULT TO PSYCHIATRY       Ordering Provider: Sharman Cheek, MD  Provider:  (Not yet assigned)  Question Answer Comment  Consult Timeframe URGENT - requires response within 12 hours   URGENT timeframe requires provider to provider communication, has the provider to provider communication been completed Yes   Reason for Consult? Consult for medication management   Contact phone number where the requesting provider can be reached [phone]      11/28/23 1706             Mode of Visit: Tele-visit Virtual Statement:TELE PSYCHIATRY ATTESTATION & CONSENT As the provider for this telehealth consult, I attest that I verified the patient's identity using two separate identifiers, introduced myself to the patient, provided my credentials, disclosed my location, and performed this encounter via a HIPAA-compliant, real-time, face-to-face, two-way, interactive audio and video platform and with the full consent and agreement of the patient (or guardian as applicable.) Patient physical location: [facility] ER. Telehealth provider physical location: home office in state of [place].   Video start time: 9 Video end time: 930    Psychiatry Consult Evaluation  Service Date: November 28, 2023 LOS:  LOS: 0 days  Chief Complaint bizarre behavior  Primary Psychiatric Diagnoses  Substance abuse  2.  Homelessness   Assessment  Meghan Jarvis is a 47 y.o. female admitted: Presented to the ED 11/28/2023  4:07 PM for falling, hitting her head and bizarre behavior. She carries the psychiatric diagnoses of substance  abuse.  Her current presentation of confusion and erratic behavior is most consistent with cocaine and alcohol abuse. On initial examination, patient is laying down and is not readily answering questions.  She has to be reawaken multiple time during the assessment.  Per chart review, triage note states,  "Pt presents to ED under IVC by [employer] under the influence of crack cocaine yesterday and ETOH today. IVC paperwork states pt is unsteady and not able to take care of herself. Pt is homeless. Pt states she ate today but unsure of what and what time. Pt c/o of chronic back pain. Pt does appear under the influence of some substance, Pt did fall and hit head, no LOC, fall witnessed by [employer]."  Please see plan below for detailed recommendations.     Diagnoses:  Active Hospital problems: Principal Problem:   Cocaine abuse with cocaine-induced mood disorder (HCC) Active Problems:   Alcohol use disorder, severe, dependence (HCC)   HIV (human immunodeficiency virus infection) (HCC)    Plan    ## Further Work-up:  [name] was admitted to [facility] ER for Cocaine abuse with cocaine-induced mood disorder (HCC), crisis management, and stabilization. Routine labs ordered, which include  Lab Orders         Basic metabolic panel         CBC with Differential         Urinalysis, Routine w reflex microscopic -Urine, Clean Catch         CK  Urine Drug Screen, Qualitative    Will maintain observation checks every 15 minutes for safety. Psychosocial education regarding relapse prevention and self-care; social and communication  Social work will consult with family for collateral information and discuss discharge and follow up plan.   ## Disposition:-- Reassess in the AM  ## Behavioral / Environmental: - No specific recommendations at this time.       Jearld Lesch, NP       History of Present Illness  Relevant Aspects of Hospital ED Course:  Admitted on 11/28/2023 for bizarre behavior.    Patient Report: Patient states that she has been feeling crazy. She denies drinking alcohol.  She denies drug use today.  She reports coming to the hospital because she fell. Per chart review, triage note states, pt presents to ED under IVC by [employer] under the influence of crack cocaine yesterday and ETOH today. IVC paperwork states pt is unsteady and not able to take care of herself. Pt is homeless. Pt states she ate today but unsure of what and what time. Pt c/o of chronic back pain.    Pt does appear under the influence of some substance.    Pt did fall and hit head, no LOC, fall witnessed by [employer]  Review of Systems  Psychiatric/Behavioral:  Positive for substance abuse. The patient is nervous/anxious and has insomnia.   All other systems reviewed and are negative.     Exam Findings  Physical Exam:  Vital Signs:  Temp:  [97.6 F (36.4 C)-98.1 F (36.7 C)] 97.6 F (36.4 C) (12/18 2003) Pulse Rate:  [78-95] 78 (12/18 2003) Resp:  [18] 18 (12/18 2003) BP: (106-133)/(70-102) 133/102 (12/18 2003) SpO2:  [93 %-98 %] 93 % (12/18 2003) Weight:  [49 kg] 49 kg (12/18 1556) Blood pressure (!) 133/102, pulse 78, temperature 97.6 F (36.4 C), temperature source Oral, resp. rate 18, height 5\' 8"  (1.727 m), weight 49 kg, SpO2 93%. Body mass index is 16.43 kg/m.  Physical Exam Vitals and nursing note reviewed.  Constitutional:      Appearance: She is toxic-appearing.  HENT:     Head: Normocephalic.  Pulmonary:     Effort: Pulmonary effort is normal.  Musculoskeletal:        General: Normal range of motion.  Neurological:     Mental Status: She is disoriented.  Psychiatric:        Attention and Perception: She is inattentive.        Mood and Affect: Affect is flat.        Speech: She is noncommunicative.        Behavior: Behavior is slowed and withdrawn.        Cognition and Memory: Cognition is impaired.        Judgment: Judgment is impulsive and inappropriate.     Mental Status  Exam: General Appearance: Disheveled  Orientation:  NA  Memory:  Immediate;   Poor  Concentration:  Concentration: Poor  Recall:  Poor  Attention  Poor  Eye Contact:  Minimal  Speech:  Blocked  Language:  Poor  Volume:  Decreased  Mood: flat  Affect:  Constricted and Flat  Thought Process:  NA  Thought Content:  NA  Suicidal Thoughts:   unknown  Homicidal Thoughts:   unknown  Judgement:  Impaired  Insight:  Lacking and Shallow  Psychomotor Activity:  Normal  Akathisia:  No  Fund of Knowledge:  Poor      Assets:  Resilience  Cognition:  Impaired,  Mild  ADL's:  Intact  AIMS (if indicated):        Other History   These have been pulled in through the EMR, reviewed, and updated if appropriate.  Family History:  The patient's Family history is unknown by patient.  Medical History: Past Medical History:  Diagnosis Date   Asthma    Depression    HIV (human immunodeficiency virus infection) (HCC)     Surgical History: Past Surgical History:  Procedure Laterality Date   ABDOMINAL HYSTERECTOMY     APPENDECTOMY       Medications:   Current Facility-Administered Medications:    alum & mag hydroxide-simeth (MAALOX/MYLANTA) 200-200-20 MG/5ML suspension 30 mL, 30 mL, Oral, Q6H PRN, Sharman Cheek, MD   ibuprofen (ADVIL) tablet 600 mg, 600 mg, Oral, Q8H PRN, Sharman Cheek, MD   ondansetron [facility]) tablet 4 mg, 4 mg, Oral, Q8H PRN, Sharman Cheek, MD  Current Outpatient Medications:    bictegravir-emtricitabine-tenofovir AF (BIKTARVY) 50-200-25 MG TABS tablet, Take 1 tablet by mouth daily. (Patient not taking: Reported on 11/28/2023), Disp: , Rfl:    cephALEXin (KEFLEX) 500 MG capsule, Take 1 capsule (500 mg total) by mouth 2 (two) times daily. (Patient not taking: Reported on 11/28/2023), Disp: 14 capsule, Rfl: 0   DULoxetine (CYMBALTA) 30 MG capsule, Take 90 mg by mouth daily., Disp: , Rfl:    gabapentin (NEURONTIN) 300 MG capsule, Take 300 mg by mouth 3  (three) times daily., Disp: , Rfl:    polycarbophil (FIBERCON) 625 MG tablet, Take 625 mg by mouth daily., Disp: , Rfl:   Allergies: Allergies  Allergen Reactions   Fish Allergy Other (See Comments)    Crab legs result in itching  Crab legs result in itching  Crab legs result in itching   Shellfish Allergy Other (See Comments)    Crab legs result in itching   Buprenorphine Hcl     Pt states she not allergic   Morphine And Codeine Itching    hives    Montrel Donahoe Damaris Hippo, NP

## 2023-11-28 NOTE — ED Provider Notes (Signed)
[facility] Provider Note    Event Date/Time   First MD Initiated Contact with Patient 11/28/23 1607     (approximate)   History   Chief Complaint: No chief complaint on file.   HPI  ARMILLA WEDEMEIER is a 47 y.o. female with a history of alcohol and cocaine abuse, HIV infection, atrial fibrillation who is brought to the ED under involuntary commitment by police due to alcohol and crack cocaine use and homelessness.  Patient reports that earlier today she started having a severe headache, which made her feel dizzy.  It is better now.  Last cocaine use was yesterday.  Had does report alcohol earlier today.  No chest pain or shortness of breath.  No vision changes, no paresthesias weakness or neck pain.     Physical Exam   Triage Vital Signs: ED Triage Vitals  Encounter Vitals Group     BP 11/28/23 1555 106/70     Systolic BP Percentile --      Diastolic BP Percentile --      Pulse Rate 11/28/23 1555 95     Resp 11/28/23 1555 18     Temp 11/28/23 1555 98.1 F (36.7 C)     Temp Source 11/28/23 1555 Oral     SpO2 11/28/23 1555 98 %     Weight 11/28/23 1556 108 lb 0.4 oz (49 kg)     Height 11/28/23 1556 5\' 8"  (1.727 m)     Head Circumference --      Peak Flow --      Pain Score 11/28/23 1556 5     Pain Loc --      Pain Education --      Exclude from Growth Chart --     Most recent vital signs: Vitals:   11/28/23 1555  BP: 106/70  Pulse: 95  Resp: 18  Temp: 98.1 F (36.7 C)  SpO2: 98%    General: Awake, no distress. CV:  Good peripheral perfusion.  Resp:  Normal effort.  Abd:  No distention.  Other:  No wounds.  Cranial nerves II through XII intact, moving all extremities, normal coordination.  Somewhat slurred speech   ED Results / Procedures / Treatments   Labs (all labs ordered are listed, but only abnormal results are displayed) Labs Reviewed  BASIC METABOLIC PANEL - Abnormal; Notable for the following components:      Result  Value   Potassium 3.0 (*)    Chloride 113 (*)    Calcium 8.6 (*)    All other components within normal limits  CBC WITH DIFFERENTIAL/PLATELET - Abnormal; Notable for the following components:   Hemoglobin 10.6 (*)    HCT 34.3 (*)    All other components within normal limits  CK  URINALYSIS, ROUTINE W REFLEX MICROSCOPIC  URINE DRUG SCREEN, QUALITATIVE ([facility] ONLY)  TROPONIN I (HIGH SENSITIVITY)     EKG Interpreted by me Sinus rhythm, rate of 80.  Normal axis, normal intervals.  Normal QRS ST segments and T waves.  No ischemic changes.   RADIOLOGY CT head interpreted by me, negative for intracranial hemorrhage.  Radiology report reviewed.  CT cervical spine unremarkable   PROCEDURES:  Procedures   MEDICATIONS ORDERED IN ED: Medications  ibuprofen (ADVIL) tablet 600 mg (has no administration in time range)  ondansetron (ZOFRAN) tablet 4 mg (has no administration in time range)  alum & mag hydroxide-simeth (MAALOX/MYLANTA) 200-200-20 MG/5ML suspension 30 mL (has no administration in time range)  IMPRESSION / MDM / ASSESSMENT AND PLAN / ED COURSE  I reviewed the triage vital signs and the nursing notes.  Patient's presentation is most consistent with acute presentation with potential threat to life or bodily function.  Patient presents with homelessness, polysubstance abuse.  Reports some headache, low suspicion for intracranial aneurysm.  Vital signs reassuring.  CT head and cervical spine unremarkable, labs reassuring.  Medically stable to proceed with psychiatry evaluation.  The patient has been placed in psychiatric observation due to the need to provide a safe environment for the patient while obtaining psychiatric consultation and evaluation, as well as ongoing medical and medication management to treat the patient's condition.  The patient has been placed under full IVC at this time.      FINAL CLINICAL IMPRESSION(S) / ED DIAGNOSES   Final diagnoses:  Crack  cocaine use  Homeless  HIV infection, unspecified symptom status (HCC)     Rx / DC Orders   ED Discharge Orders     None        Note:  This document was prepared using Dragon voice recognition software and may include unintentional dictation errors.   Sharman Cheek, MD 11/28/23 1806

## 2023-11-28 NOTE — ED Triage Notes (Signed)
Pt presents to ED under IVC by [employer] under the influence of crack cocaine yesterday and ETOH today. IVC paperwork states pt is unsteady and not able to take care of herself. Pt is homeless. Pt states she ate today but unsure of what and what time. Pt c/o of chronic back pain.   Pt does appear under the influence of some substance.   Pt did fall and hit head, no LOC, fall witnessed by [employer].

## 2023-11-28 NOTE — ED Provider Triage Note (Signed)
Emergency Medicine Provider Triage Evaluation Note  Meghan Jarvis , a 47 y.o. female  was evaluated in triage.  Pt complains of trouble walking per [employer], and then stumbled anf fell and hit her head. They IVCed her. Patient admits to ETOH and crack cocaine use. Police were afraid she was going to stumble into street.  Review of Systems  Positive: Trouble walking Negative: fever  Physical Exam  There were no vitals taken for this visit. Gen:   Awake, no distress   Resp:  Normal effort  MSK:   Moves extremities without difficulty  Other:    Medical Decision Making  Medically screening exam initiated at 3:54 PM.  Appropriate orders placed.  Meghan Jarvis was informed that the remainder of the evaluation will be completed by another provider, this initial triage assessment does not replace that evaluation, and the importance of remaining in the ED until their evaluation is complete.     Meghan Hoehn, PA-C 11/28/23 1624

## 2023-11-28 NOTE — ED Notes (Signed)
IVC/pending psych consult 

## 2023-11-28 NOTE — ED Notes (Signed)
Dining services notified- tray ordered.

## 2023-11-28 NOTE — ED Notes (Signed)
Pt calm and alert in triage  Pt belongings:   White long sleeve shirt Trash bag of belongings  Beige sweat pants Black sneakers Red hat Black bra [place] jacket  Grey socks

## 2023-11-29 LAB — URINALYSIS, ROUTINE W REFLEX MICROSCOPIC
Bilirubin Urine: NEGATIVE
Glucose, UA: NEGATIVE mg/dL
Hgb urine dipstick: NEGATIVE
Ketones, ur: NEGATIVE mg/dL
Nitrite: NEGATIVE
Protein, ur: 30 mg/dL — AB
Specific Gravity, Urine: 1.024 (ref 1.005–1.030)
pH: 6 (ref 5.0–8.0)

## 2023-11-29 LAB — URINE DRUG SCREEN, QUALITATIVE (ARMC ONLY)
Amphetamines, Ur Screen: NOT DETECTED
Barbiturates, Ur Screen: NOT DETECTED
Benzodiazepine, Ur Scrn: NOT DETECTED
Cannabinoid 50 Ng, Ur ~~LOC~~: NOT DETECTED
Cocaine Metabolite,Ur ~~LOC~~: POSITIVE — AB
MDMA (Ecstasy)Ur Screen: NOT DETECTED
Methadone Scn, Ur: NOT DETECTED
Opiate, Ur Screen: NOT DETECTED
Phencyclidine (PCP) Ur S: NOT DETECTED
Tricyclic, Ur Screen: NOT DETECTED

## 2023-11-29 NOTE — ED Notes (Signed)
IVC patient moved to BHU4, pending reassessment

## 2023-11-29 NOTE — ED Provider Notes (Signed)
Emergency Medicine Observation Re-evaluation Note  Physical Exam   BP (!) 133/102 (BP Location: Right Arm)   Pulse 78   Temp 97.6 F (36.4 C) (Oral)   Resp 18   Ht 5\' 8"  (1.727 m)   Wt 49 kg   SpO2 93%   BMI 16.43 kg/m   Patient appears in no acute distress.  ED Course / MDM   No reported events during my shift at the time of this note.   Pt is awaiting dispo from consultants   Pilar Jarvis MD    Pilar Jarvis, MD 11/29/23 [phone]

## 2023-11-29 NOTE — ED Notes (Signed)
Pt given breakfast tray

## 2023-11-29 NOTE — ED Notes (Signed)
PT IVC PENDING REASSESSMENT THIS MORNING.

## 2023-11-29 NOTE — ED Notes (Signed)
Pt provided with shower supplies to shower per patients request. Supplies provided include: 6 wash cloths, 2 towels, 1 bar of soap, 1 shampoo, 1 tooth brush, 1 tooth paste, 1 clean scrub top, 1 clean scrub pants, 1 pair of yellow socks.

## 2023-11-29 NOTE — ED Notes (Signed)
Pt out of shower and in clean scrubs. Shower materials disposed of appropriately. Pt in room sitting up in bed.

## 2023-11-29 NOTE — ED Notes (Signed)
Pt received hospital provided lunch tray

## 2023-11-29 NOTE — ED Notes (Signed)
Dinner tray and sprite provided

## 2023-11-29 NOTE — ED Notes (Signed)
Pt escorted to BHU 4 by security and ED tech. Pt alert and calm at this time. This nurse oriented pt to BHU, the rules/policies/security cameras, and meal/snack times. Pt verbalized understanding.

## 2023-11-29 NOTE — ED Notes (Signed)
Apple slices provided with ice water

## 2023-11-30 DIAGNOSIS — F1994 Other psychoactive substance use, unspecified with psychoactive substance-induced mood disorder: Secondary | ICD-10-CM

## 2023-11-30 DIAGNOSIS — F1494 Cocaine use, unspecified with cocaine-induced mood disorder: Secondary | ICD-10-CM

## 2023-11-30 DIAGNOSIS — Z5900 Homelessness unspecified: Secondary | ICD-10-CM | POA: Diagnosis not present

## 2023-11-30 DIAGNOSIS — F1490 Cocaine use, unspecified, uncomplicated: Secondary | ICD-10-CM

## 2023-11-30 NOTE — ED Notes (Signed)
Given

## 2023-11-30 NOTE — Discharge Instructions (Signed)
You have been seen in the Emergency Department (ED) today for a psychiatric complaint.  You have been evaluated by psychiatry and we believe you are safe to be discharged from the hospital.   ° °Please return to the ED immediately if you have ANY thoughts of hurting yourself or anyone else, so that we may help you. ° °Please avoid alcohol and drug use. ° °Follow up with your doctor and/or therapist as soon as possible regarding today's ED visit.   Please follow up any other recommendations and clinic appointments provided by the psychiatry team that saw you in the Emergency Department. ° °

## 2023-11-30 NOTE — ED Notes (Signed)
IVC/  PENDING  REASSESSMENT

## 2023-11-30 NOTE — Consult Note (Signed)
[facility] Consult Note  Patient Name: Meghan Jarvis MRN: [id number] DOB: 10-Apr-1976 DATE OF Consult: 11/30/2023  PRIMARY PSYCHIATRIC DIAGNOSES  1.  Substance Induced Mood Disorder 2.  Cocaine Use Disorder, Moderate   RECOMMENDATIONS  Recommendations: Medication recommendations: None indicated at this time   Non-Medication/therapeutic recommendations:  Patient no longer meets criteria for IVC or inpatient psychiatric admission.   -- Provide outpatient psychiatric and therapy resources -- Provide outpatient substance abuse treatment information  -- Shelter resources   Is inpatient psychiatric hospitalization recommended for this patient?  -- No, patient is not an imminent danger to self or others  Communication: Treatment team members (and family members if applicable) who were involved in treatment/care discussions and planning, and with whom we spoke or engaged with via secure text/chat, include the following: ED primary team  Thank you for involving [place] in the care of this patient. If you have any additional questions or concerns, please call [phone] and ask for me or the provider on-call.  TELEPSYCHIATRY ATTESTATION & CONSENT  As the provider for this telehealth consult, I attest that I verified the patient's identity using two separate identifiers, introduced myself to the patient, provided my credentials, disclosed my location, and performed this encounter via a HIPAA-compliant, real-time, face-to-face, two-way, interactive audio and video platform and with the full consent and agreement of the patient (or guardian as applicable.)  Patient physical location: ED in [facility]. Telehealth provider physical location: home office in state of [place].  Video start time: 0636 (Central Time) Video end time: 0642 (Central Time)  IDENTIFYING DATA  Meghan Jarvis is a 47 y.o. year-old female for whom a psychiatric consultation has been ordered  by the primary provider. The patient was identified using two separate identifiers.  CHIEF COMPLAINT/REASON FOR CONSULT  IVC Reassessment    HISTORY OF PRESENT ILLNESS (HPI)  Patient originally presented to the ED 11/28/2023, via [facility], under the influence of crack cocaine and alcohol. IVC petition stated patient is unsteady on her feet and not able to take care of herself, using illegal narcotics and drinking beer, severe pain, and is prescribed medications but unable to state when she last took them.   Patient seen by psychiatry on 12/18 which noted patient appeared under the influence of substances, erratic behavior. Recommend re-assessment on 12/19. UDS + cocaine. No BAL obtained. No assessment completed by psychiatry on 12/19. Per chart review, no behavioral issues throughout ED stay; no PRN's needed.   Patient evaluated for psychiatric consultation this morning. She is calm and cooperative. Sitting on hospital bed, drinking beverage, ate breakfast. Speech is garbled, difficult to understand at times. Appears disheveled, homeless. Patient states she came to the hospital by police because she had "shit on the side walk". Patient states she was "high". Does not remember the last time she used crack cocaine but states she had prior to the police arriving. Patient denies frequent use of illicit substances, denies recent alcohol. Patient denies need for substance abuse treatment.   Denies depression, anxiety, stressors. Has been sleeping and eating well. No suicidal or homicidal ideations. Denies past suicide attempts. Denies hallucinations, paranoia. No delusions apparent otherwise. She does not appear overtly psychotic. Behavior appropriate, polite. Patient requesting to discharge the hospital. Does not feel she needs treatment at this time.     PAST PSYCHIATRIC HISTORY  Denies past psychiatric hospitalizations. No current outpatient psychiatric treatment. No current psychotropic medications. No past  suicide attempts.   Otherwise as per HPI  above.  PAST MEDICAL HISTORY  Past Medical History:  Diagnosis Date   Asthma    Depression    HIV (human immunodeficiency virus infection) (HCC)      HOME MEDICATIONS  Facility Ordered Medications  Medication   ibuprofen (ADVIL) tablet 600 mg   ondansetron (ZOFRAN) tablet 4 mg   alum & mag hydroxide-simeth (MAALOX/MYLANTA) 200-200-20 MG/5ML suspension 30 mL   PTA Medications  Medication Sig   bictegravir-emtricitabine-tenofovir AF (BIKTARVY) 50-200-25 MG TABS tablet Take 1 tablet by mouth daily. (Patient not taking: Reported on 11/28/2023)   polycarbophil (FIBERCON) 625 MG tablet Take 625 mg by mouth daily.   DULoxetine (CYMBALTA) 30 MG capsule Take 90 mg by mouth daily.   gabapentin (NEURONTIN) 300 MG capsule Take 300 mg by mouth 3 (three) times daily.   cephALEXin (KEFLEX) 500 MG capsule Take 1 capsule (500 mg total) by mouth 2 (two) times daily. (Patient not taking: Reported on 11/28/2023)     ALLERGIES  Allergies  Allergen Reactions   Fish Allergy Other (See Comments)    Crab legs result in itching  Crab legs result in itching  Crab legs result in itching   Shellfish Allergy Other (See Comments)    Crab legs result in itching   Buprenorphine Hcl     Pt states she not allergic   Morphine And Codeine Itching    hives    SOCIAL & SUBSTANCE USE HISTORY  Social History   Socioeconomic History   Marital status: Divorced    Spouse name: Not on file   Number of children: Not on file   Years of education: Not on file   Highest education level: Not on file  Occupational History   Not on file  Tobacco Use   Smoking status: Every Day    Current packs/day: 1.00    Types: Cigarettes   Smokeless tobacco: Never  Substance and Sexual Activity   Alcohol use: Yes   Drug use: Yes    Types: Cocaine   Sexual activity: Yes    Birth control/protection: Surgical  Other Topics Concern   Not on file  Social History Narrative   Not on  file   Social Drivers of Health   Financial Resource Strain: Not on file  Food Insecurity: Food Insecurity Present (09/26/2023)   Hunger Vital Sign    Worried About Running Out of Food in the Last Year: Often true    Ran Out of Food in the Last Year: Sometimes true  Transportation Needs: Unmet Transportation Needs (10/11/2023)   PRAPARE - Administrator, Civil Service (Medical): Yes    Lack of Transportation (Non-Medical): Patient unable to answer  Physical Activity: Not on file  Stress: Not on file  Social Connections: Not on file   Social History   Tobacco Use  Smoking Status Every Day   Current packs/day: 1.00   Types: Cigarettes  Smokeless Tobacco Never   Social History   Substance and Sexual Activity  Alcohol Use Yes   Social History   Substance and Sexual Activity  Drug Use Yes   Types: Cocaine    Additional pertinent information homelessness.  FAMILY HISTORY  Family History  Family history unknown: Yes   Family Psychiatric History (if known):  None disclosed at this time  MENTAL STATUS EXAM (MSE)  Mental Status Exam: General Appearance: Disheveled  Orientation:  Full (Time, Place, and Person)  Memory:  Recent;   Fair  Concentration:  Concentration: Fair  Recall:  Fair  Attention  Fair  Eye Contact:  Good  Speech:  Garbled  Language:  Fair  Volume:  Normal  Mood: "fine"  Affect:  Appropriate  Thought Process:  Linear  Thought Content:  Logical  Suicidal Thoughts:  No  Homicidal Thoughts:  No  Judgement:  Poor  Insight:  Lacking  Psychomotor Activity:  Normal  Akathisia:  No  Fund of Knowledge:  Fair    Assets:  Manufacturing systems engineer Desire for Improvement Social Support  Cognition:  WNL  ADL's:  Intact  AIMS (if indicated):       VITALS  Blood pressure 99/73, pulse (!) 53, temperature 98.9 F (37.2 C), temperature source Oral, resp. rate 17, height 5\' 8"  (1.727 m), weight 49 kg, SpO2 95%.  LABS  Admission on 11/28/2023   Component Date Value Ref Range Status   Sodium 11/28/2023 144  135 - 145 mmol/L Final   Potassium 11/28/2023 3.0 (L)  3.5 - 5.1 mmol/L Final   Chloride 11/28/2023 113 (H)  98 - 111 mmol/L Final   CO2 11/28/2023 24  22 - 32 mmol/L Final   Glucose, Bld 11/28/2023 91  70 - 99 mg/dL Final   Glucose reference range applies only to samples taken after fasting for at least 8 hours.   BUN 11/28/2023 12  6 - 20 mg/dL Final   Creatinine, Ser 11/28/2023 0.95  0.44 - 1.00 mg/dL Final   Calcium 69/67/8938 8.6 (L)  8.9 - 10.3 mg/dL Final   GFR, Estimated 11/28/2023 >60  >60 mL/min Final   Comment: (NOTE) Calculated using the CKD-EPI Creatinine Equation (2021)    Anion gap 11/28/2023 7  5 - 15 Final   Performed at [facility], [address] [place], [place] [address]   WBC 11/28/2023 4.2  4.0 - 10.5 K/uL Final   RBC 11/28/2023 3.87  3.87 - 5.11 MIL/uL Final   Hemoglobin 11/28/2023 10.6 (L)  12.0 - 15.0 g/dL Final   HCT 10/04/8526 34.3 (L)  36.0 - 46.0 % Final   MCV 11/28/2023 88.6  80.0 - 100.0 fL Final   MCH 11/28/2023 27.4  26.0 - 34.0 pg Final   MCHC 11/28/2023 30.9  30.0 - 36.0 g/dL Final   RDW 78/24/2353 15.1  11.5 - 15.5 % Final   Platelets 11/28/2023 172  150 - 400 K/uL Final   nRBC 11/28/2023 0.0  0.0 - 0.2 % Final   Neutrophils Relative % 11/28/2023 56  % Final   Neutro Abs 11/28/2023 2.4  1.7 - 7.7 K/uL Final   Lymphocytes Relative 11/28/2023 19  % Final   Lymphs Abs 11/28/2023 0.8  0.7 - 4.0 K/uL Final   Monocytes Relative 11/28/2023 11  % Final   Monocytes Absolute 11/28/2023 0.5  0.1 - 1.0 K/uL Final   Eosinophils Relative 11/28/2023 13  % Final   Eosinophils Absolute 11/28/2023 0.5  0.0 - 0.5 K/uL Final   Basophils Relative 11/28/2023 1  % Final   Basophils Absolute 11/28/2023 0.0  0.0 - 0.1 K/uL Final   Immature Granulocytes 11/28/2023 0  % Final   Abs Immature Granulocytes 11/28/2023 0.00  0.00 - 0.07 K/uL Final   Performed at [facility], [address] [place], [place] [address]   Troponin I (High Sensitivity) 11/28/2023 4  <18 ng/L Final   Comment: (NOTE) Elevated high sensitivity troponin I (hsTnI) values and significant  changes across serial measurements may suggest ACS but many other  chronic and acute conditions are known to elevate  hsTnI results.  Refer to the "Links" section for chest pain algorithms and additional  guidance. Performed at [facility], [address] [place], [place] [address]    Color, Urine 11/29/2023 YELLOW (A)  YELLOW Final   APPearance 11/29/2023 HAZY (A)  CLEAR Final   Specific Gravity, Urine 11/29/2023 1.024  1.005 - 1.030 Final   pH 11/29/2023 6.0  5.0 - 8.0 Final   Glucose, UA 11/29/2023 NEGATIVE  NEGATIVE mg/dL Final   Hgb urine dipstick 11/29/2023 NEGATIVE  NEGATIVE Final   Bilirubin Urine 11/29/2023 NEGATIVE  NEGATIVE Final   Ketones, ur 11/29/2023 NEGATIVE  NEGATIVE mg/dL Final   Protein, ur 82/95/6213 30 (A)  NEGATIVE mg/dL Final   Nitrite 08/65/7846 NEGATIVE  NEGATIVE Final   Leukocytes,Ua 11/29/2023 LARGE (A)  NEGATIVE Final   RBC / HPF 11/29/2023 11-20  0 - 5 RBC/hpf Final   WBC, UA 11/29/2023 11-20  0 - 5 WBC/hpf Final   Bacteria, UA 11/29/2023 FEW (A)  NONE SEEN Final   Squamous Epithelial / HPF 11/29/2023 0-5  0 - 5 /HPF Final   Mucus 11/29/2023 PRESENT   Final   Performed at [facility], [address] [place], [place] [address]   Total CK 11/28/2023 145  38 - 234 U/L Final   Performed at [facility], [address] [place], [place] [address]   Tricyclic, Ur Screen 11/29/2023 NONE DETECTED  NONE DETECTED Final   Amphetamines, Ur Screen 11/29/2023 NONE DETECTED  NONE DETECTED Final   MDMA (Ecstasy)Ur Screen 11/29/2023 NONE DETECTED  NONE DETECTED Final   Cocaine Metabolite,Ur [place] 11/29/2023 POSITIVE (A)  NONE DETECTED Final   Opiate, Ur Screen 11/29/2023 NONE DETECTED  NONE DETECTED Final   Phencyclidine (PCP) Ur S 11/29/2023 NONE DETECTED  NONE  DETECTED Final   Cannabinoid 50 Ng, Ur [place] 11/29/2023 NONE DETECTED  NONE DETECTED Final   Barbiturates, Ur Screen 11/29/2023 NONE DETECTED  NONE DETECTED Final   Benzodiazepine, Ur Scrn 11/29/2023 NONE DETECTED  NONE DETECTED Final   Methadone Scn, Ur 11/29/2023 NONE DETECTED  NONE DETECTED Final   Comment: (NOTE) Tricyclics + metabolites, urine    Cutoff 1000 ng/mL Amphetamines + metabolites, urine  Cutoff 1000 ng/mL MDMA (Ecstasy), urine              Cutoff 500 ng/mL Cocaine Metabolite, urine          Cutoff 300 ng/mL Opiate + metabolites, urine        Cutoff 300 ng/mL Phencyclidine (PCP), urine         Cutoff 25 ng/mL Cannabinoid, urine                 Cutoff 50 ng/mL Barbiturates + metabolites, urine  Cutoff 200 ng/mL Benzodiazepine, urine              Cutoff 200 ng/mL Methadone, urine                   Cutoff 300 ng/mL  The urine drug screen provides only a preliminary, unconfirmed analytical test result and should not be used for non-medical purposes. Clinical consideration and professional judgment should be applied to any positive drug screen result due to possible interfering substances. A more specific alternate chemical method must be used in order to obtain a confirmed analytical result. Gas chromatography / mass spectrometry (GC/MS) is the preferred confirm  atory method. Performed at [facility], [address] [place], [place] [address]     PSYCHIATRIC REVIEW OF SYSTEMS (ROS)  ROS: Notable for the following relevant positive findings: Review of Systems  Psychiatric/Behavioral:  Positive for substance abuse. Negative for depression, hallucinations and suicidal ideas.     Additional findings:      Musculoskeletal: No abnormal movements observed      Gait & Station: Laying/Sitting      Pain Screening: Pain present; requesting medications       Nutrition & Dental Concerns: No concerns at this time  RISK FORMULATION/ASSESSMENT   Is the patient experiencing any suicidal or homicidal ideations: No       Explain if yes:  Protective factors considered for safety management: access to care, willingness to comply, no past attempts, no prior hospitalizations, social support  Risk factors/concerns considered for safety management:  Substance abuse/dependence Physical illness/chronic pain Barriers to accessing treatment Unmarried  Is there a safety management plan with the patient and treatment team to minimize risk factors and promote protective factors: Yes           Explain: Patient currently in the ED; outpatient substance abuse treatment information. Patient requesting discharge. No longer meets criteria for IVC.  Is crisis care placement or psychiatric hospitalization recommended: No     Based on my current evaluation and risk assessment, patient is determined at this time to be at:  Low risk  *RISK ASSESSMENT Risk assessment is a dynamic process; it is possible that this patient's condition, and risk level, may change. This should be re-evaluated and managed over time as appropriate. Please re-consult psychiatric consult services if additional assistance is needed in terms of risk assessment and management. If your team decides to discharge this patient, please advise the patient how to best access emergency psychiatric services, or to call 911, if their condition worsens or they feel unsafe in any way.   Assunta Gambles, NP Tele[facility]iat[facility] Consult Services

## 2023-11-30 NOTE — ED Provider Notes (Signed)
Per psychiatry " Patient currently in the ED; outpatient substance abuse treatment information. Patient requesting discharge. No longer meets criteria for IVC. "  I will rescind the patient's IVC.  Provide information for outpatient substance abuse resources and return precautions   Sharyn Creamer, MD 11/30/23 1013

## 2023-11-30 NOTE — ED Provider Notes (Signed)
Emergency Medicine Observation Re-evaluation Note  Physical Exam   Vitals:   11/29/23 1049 11/29/23 2046  BP:  99/73  Pulse:  (!) 53  Resp:  17  Temp: 98.2 F (36.8 C) 98.9 F (37.2 C)  SpO2:  95%    Sleeping comfortably  Patient appears in no acute distress.  ED Course / MDM   No reported events during my shift at the time of this note.   Currently under IVC awaiting reassessment by psychiatry    Sharyn Creamer, MD 11/30/23 [phone]

## 2023-11-30 NOTE — ED Notes (Signed)
IVC PAPERS  RESCINDED PER  DR  Sherryl Manges  MD  INFORMED  DOROTHY RN

## 2023-11-30 NOTE — ED Provider Notes (Signed)
Change of comittement form completed by me. IVC rescinded   Sharyn Creamer, MD 11/30/23 1301

## 2023-12-06 ENCOUNTER — Emergency Department: Payer: MEDICAID

## 2023-12-06 ENCOUNTER — Other Ambulatory Visit: Payer: Self-pay

## 2023-12-06 ENCOUNTER — Emergency Department
Admission: EM | Admit: 2023-12-06 | Discharge: 2023-12-06 | Disposition: A | Discharge status: Home / Self Care | Payer: MEDICAID | Attending: Emergency Medicine | Admitting: Emergency Medicine

## 2023-12-06 DIAGNOSIS — W19XXXA Unspecified fall, initial encounter: Secondary | ICD-10-CM

## 2023-12-06 DIAGNOSIS — S39012A Strain of muscle, fascia and tendon of lower back, initial encounter: Secondary | ICD-10-CM | POA: Diagnosis not present

## 2023-12-06 DIAGNOSIS — Y92002 Bathroom of unspecified non-institutional (private) residence single-family (private) house as the place of occurrence of the external cause: Secondary | ICD-10-CM | POA: Diagnosis not present

## 2023-12-06 DIAGNOSIS — M25521 Pain in right elbow: Secondary | ICD-10-CM | POA: Insufficient documentation

## 2023-12-06 DIAGNOSIS — T148XXA Other injury of unspecified body region, initial encounter: Secondary | ICD-10-CM

## 2023-12-06 DIAGNOSIS — W182XXA Fall in (into) shower or empty bathtub, initial encounter: Secondary | ICD-10-CM | POA: Diagnosis not present

## 2023-12-06 DIAGNOSIS — S3992XA Unspecified injury of lower back, initial encounter: Secondary | ICD-10-CM | POA: Diagnosis present

## 2023-12-06 MED ORDER — IBUPROFEN 800 MG PO TABS: 800.0000 mg | ORAL_TABLET | Freq: Three times a day (TID) | ORAL | 0 refills | Status: DC | PRN

## 2023-12-06 MED ORDER — KETOROLAC TROMETHAMINE 60 MG/2ML IM SOLN
60.0000 mg | Freq: Once | INTRAMUSCULAR | Status: AC
  Administered 2023-12-06: 60 mg via INTRAMUSCULAR
  Filled 2023-12-06: qty 2

## 2023-12-06 NOTE — Discharge Instructions (Signed)
You have been this discharge with diagnosis of muscle strain.  This take ibuprofen as advise.  Please make an appointment with your PCP if any worsening symptoms or new symptoms

## 2023-12-06 NOTE — ED Provider Notes (Signed)
[facility] Provider Note    Event Date/Time   First MD Initiated Contact with Patient 12/06/23 1346     (approximate)   History   Fall   HPI  Meghan Jarvis is a 47 y.o. female who presents today with history of fall in the bathroom this morning, paining of right elbow pain, and lumbar pain.       Physical Exam   Triage Vital Signs: ED Triage Vitals  Encounter Vitals Group     BP 12/06/23 1130 105/77     Systolic BP Percentile --      Diastolic BP Percentile --      Pulse Rate 12/06/23 1130 82     Resp 12/06/23 1130 16     Temp 12/06/23 1130 97.9 F (36.6 C)     Temp src --      SpO2 12/06/23 1130 100 %     Weight 12/06/23 1131 100 lb (45.4 kg)     Height 12/06/23 1131 5\' 8"  (1.727 m)     Head Circumference --      Peak Flow --      Pain Score 12/06/23 1131 10     Pain Loc --      Pain Education --      Exclude from Growth Chart --     Most recent vital signs: Vitals:   12/06/23 1130  BP: 105/77  Pulse: 82  Resp: 16  Temp: 97.9 F (36.6 C)  SpO2: 100%     Constitutional: Alert, mild distress Eyes: Conjunctivae are normal.  Head: Atraumatic. Nose: No congestion/rhinnorhea. Mouth/Throat: Mucous membranes are moist.   Neck: Painless ROM.  Cardiovascular:   Good peripheral circulation. Respiratory: Normal respiratory effort.  No retractions.  Gastrointestinal: Soft and nontender.  Musculoskeletal: Right upper extremity: Shoulder: Skin intact,  full ROM.  Right elbow: Skin intact, tenderness to palpation Neurologic:  MAE spontaneously. No gross focal neurologic deficits are appreciated. Skin:  Skin is warm, dry and intact. No rash noted. Psychiatric: Mood and affect are normal. Speech and behavior are normal.    ED Results / Procedures / Treatments   Labs (all labs ordered are listed, but only abnormal results are displayed) Labs Reviewed - No data to display   EKG     RADIOLOGY I independently reviewed and  interpreted imaging and agree with radiologists findings.      PROCEDURES:  Critical Care performed:   Procedures   MEDICATIONS ORDERED IN ED: Medications  ketorolac (TORADOL) injection 60 mg (60 mg Intramuscular Given 12/06/23 1511)     IMPRESSION / MDM / ASSESSMENT AND PLAN / ED COURSE  I reviewed the triage vital signs and the nursing notes.  Differential diagnosis includes, but is not limited to, humeral fracture, radial fracture, lumbar fracture, muscle strain   Patient's presentation is most consistent with acute complicated illness / injury requiring diagnostic workup.   Patient's diagnosis is consistent with muscle strain. I independently reviewed and interpreted imaging and agree with radiologists findings. I did review the patient's allergies and medications. Patient will be discharged home with prescriptions for ibuprofen. Patient is to follow up with PCP as needed or otherwise directed. Patient is given ED precautions to return to the ED for any worsening or new symptoms. Discussed plan of care with patient, answered all of patient's questions, Patient agreeable to plan of care. Advised patient to take medications according to the instructions on the label. Discussed possible side effects of new medications. Patient  verbalized understanding. Clinical Course as of 12/06/23 1526  Thu Dec 06, 2023  1522 DG Elbow Complete Right [AE]  1522 DG Elbow Complete Right Negative for fractures [AE]  1522 DG Lumbar Spine Complete Negative for fracture [AE]    Clinical Course User Index [AE] Gladys Damme, PA-C     FINAL CLINICAL IMPRESSION(S) / ED DIAGNOSES   Final diagnoses:  Fall, initial encounter  Muscle strain     Rx / DC Orders   ED Discharge Orders          Ordered    ibuprofen (ADVIL) 800 MG tablet  Every 8 hours PRN        12/06/23 1526             Note:  This document was prepared using Dragon voice recognition software and may include  unintentional dictation errors.   Gladys Damme, PA-C 12/06/23 1529    Janith Lima, MD 12/06/23 [phone]

## 2023-12-06 NOTE — ED Notes (Signed)
See triage note  States she missed a step  Larey Seat  Landed on left side  Having pain to left shoulder area  Conts to ask to food   [employer] offered

## 2023-12-06 NOTE — ED Triage Notes (Signed)
See first nurse note. Reports fall today after mis-step. Reports hitting head, denies LOC. Reports pain to "whole left side", c/o pain to left shoulder specifically.  Pt hyper-fixated on receiving food in triage.

## 2023-12-06 NOTE — ED Triage Notes (Signed)
First RN: [place] bus [place] after a fall in the bathroom. Pt c/o left head and left arm pain. Pt denies LOC or blood thinner or deformities. Pt states pain radiates to her lower back and headache pain. Hx of HIV.   118/84 81 96% RA 105-cbg

## 2023-12-08 ENCOUNTER — Emergency Department: Payer: MEDICAID

## 2023-12-08 ENCOUNTER — Other Ambulatory Visit: Payer: Self-pay

## 2023-12-08 ENCOUNTER — Inpatient Hospital Stay
Admission: EM | Admit: 2023-12-08 | Discharge: 2024-05-07 | DRG: 917 | Disposition: A | Discharge status: Home / Self Care | Payer: MEDICAID | Attending: Student | Admitting: Student

## 2023-12-08 DIAGNOSIS — J690 Pneumonitis due to inhalation of food and vomit: Secondary | ICD-10-CM | POA: Diagnosis present

## 2023-12-08 DIAGNOSIS — F1490 Cocaine use, unspecified, uncomplicated: Secondary | ICD-10-CM | POA: Diagnosis not present

## 2023-12-08 DIAGNOSIS — F14129 Cocaine abuse with intoxication, unspecified: Secondary | ICD-10-CM | POA: Diagnosis present

## 2023-12-08 DIAGNOSIS — T7840XA Allergy, unspecified, initial encounter: Secondary | ICD-10-CM | POA: Diagnosis not present

## 2023-12-08 DIAGNOSIS — E43 Unspecified severe protein-calorie malnutrition: Secondary | ICD-10-CM | POA: Diagnosis present

## 2023-12-08 DIAGNOSIS — G9341 Metabolic encephalopathy: Secondary | ICD-10-CM | POA: Diagnosis present

## 2023-12-08 DIAGNOSIS — W06XXXA Fall from bed, initial encounter: Secondary | ICD-10-CM | POA: Diagnosis not present

## 2023-12-08 DIAGNOSIS — Z7689 Persons encountering health services in other specified circumstances: Secondary | ICD-10-CM

## 2023-12-08 DIAGNOSIS — R079 Chest pain, unspecified: Secondary | ICD-10-CM | POA: Diagnosis not present

## 2023-12-08 DIAGNOSIS — M25562 Pain in left knee: Secondary | ICD-10-CM | POA: Diagnosis not present

## 2023-12-08 DIAGNOSIS — D61818 Other pancytopenia: Secondary | ICD-10-CM | POA: Diagnosis present

## 2023-12-08 DIAGNOSIS — R197 Diarrhea, unspecified: Secondary | ICD-10-CM | POA: Diagnosis present

## 2023-12-08 DIAGNOSIS — R419 Unspecified symptoms and signs involving cognitive functions and awareness: Secondary | ICD-10-CM | POA: Diagnosis not present

## 2023-12-08 DIAGNOSIS — B20 Human immunodeficiency virus [HIV] disease: Secondary | ICD-10-CM | POA: Diagnosis present

## 2023-12-08 DIAGNOSIS — R627 Adult failure to thrive: Secondary | ICD-10-CM | POA: Diagnosis present

## 2023-12-08 DIAGNOSIS — M25561 Pain in right knee: Secondary | ICD-10-CM | POA: Diagnosis not present

## 2023-12-08 DIAGNOSIS — E11649 Type 2 diabetes mellitus with hypoglycemia without coma: Secondary | ICD-10-CM | POA: Diagnosis present

## 2023-12-08 DIAGNOSIS — R32 Unspecified urinary incontinence: Secondary | ICD-10-CM | POA: Diagnosis present

## 2023-12-08 DIAGNOSIS — I9589 Other hypotension: Secondary | ICD-10-CM | POA: Diagnosis not present

## 2023-12-08 DIAGNOSIS — R4182 Altered mental status, unspecified: Secondary | ICD-10-CM | POA: Diagnosis present

## 2023-12-08 DIAGNOSIS — I4891 Unspecified atrial fibrillation: Secondary | ICD-10-CM | POA: Diagnosis present

## 2023-12-08 DIAGNOSIS — B589 Toxoplasmosis, unspecified: Secondary | ICD-10-CM | POA: Diagnosis not present

## 2023-12-08 DIAGNOSIS — E66811 Obesity, class 1: Secondary | ICD-10-CM | POA: Diagnosis present

## 2023-12-08 DIAGNOSIS — I5032 Chronic diastolic (congestive) heart failure: Secondary | ICD-10-CM | POA: Diagnosis present

## 2023-12-08 DIAGNOSIS — E1165 Type 2 diabetes mellitus with hyperglycemia: Secondary | ICD-10-CM | POA: Diagnosis present

## 2023-12-08 DIAGNOSIS — R519 Headache, unspecified: Secondary | ICD-10-CM | POA: Diagnosis present

## 2023-12-08 DIAGNOSIS — F02818 Dementia in other diseases classified elsewhere, unspecified severity, with other behavioral disturbance: Secondary | ICD-10-CM | POA: Diagnosis present

## 2023-12-08 DIAGNOSIS — E039 Hypothyroidism, unspecified: Secondary | ICD-10-CM | POA: Diagnosis present

## 2023-12-08 DIAGNOSIS — R1011 Right upper quadrant pain: Secondary | ICD-10-CM | POA: Diagnosis present

## 2023-12-08 DIAGNOSIS — R636 Underweight: Secondary | ICD-10-CM

## 2023-12-08 DIAGNOSIS — R531 Weakness: Secondary | ICD-10-CM | POA: Diagnosis not present

## 2023-12-08 DIAGNOSIS — G4489 Other headache syndrome: Secondary | ICD-10-CM | POA: Diagnosis not present

## 2023-12-08 DIAGNOSIS — Z681 Body mass index (BMI) 19 or less, adult: Secondary | ICD-10-CM | POA: Diagnosis not present

## 2023-12-08 DIAGNOSIS — F1010 Alcohol abuse, uncomplicated: Secondary | ICD-10-CM | POA: Diagnosis present

## 2023-12-08 DIAGNOSIS — M60851 Other myositis, right thigh: Secondary | ICD-10-CM | POA: Diagnosis not present

## 2023-12-08 DIAGNOSIS — M609 Myositis, unspecified: Secondary | ICD-10-CM | POA: Diagnosis present

## 2023-12-08 DIAGNOSIS — I952 Hypotension due to drugs: Secondary | ICD-10-CM | POA: Diagnosis not present

## 2023-12-08 DIAGNOSIS — F0284 Dementia in other diseases classified elsewhere, unspecified severity, with anxiety: Secondary | ICD-10-CM | POA: Diagnosis present

## 2023-12-08 DIAGNOSIS — R1084 Generalized abdominal pain: Secondary | ICD-10-CM | POA: Diagnosis not present

## 2023-12-08 DIAGNOSIS — R21 Rash and other nonspecific skin eruption: Secondary | ICD-10-CM | POA: Diagnosis not present

## 2023-12-08 DIAGNOSIS — M1611 Unilateral primary osteoarthritis, right hip: Secondary | ICD-10-CM | POA: Diagnosis not present

## 2023-12-08 DIAGNOSIS — J45909 Unspecified asthma, uncomplicated: Secondary | ICD-10-CM | POA: Diagnosis present

## 2023-12-08 DIAGNOSIS — L299 Pruritus, unspecified: Secondary | ICD-10-CM | POA: Diagnosis present

## 2023-12-08 DIAGNOSIS — I110 Hypertensive heart disease with heart failure: Secondary | ICD-10-CM | POA: Diagnosis present

## 2023-12-08 DIAGNOSIS — R1031 Right lower quadrant pain: Secondary | ICD-10-CM

## 2023-12-08 DIAGNOSIS — T405X1A Poisoning by cocaine, accidental (unintentional), initial encounter: Principal | ICD-10-CM | POA: Diagnosis present

## 2023-12-08 DIAGNOSIS — K5900 Constipation, unspecified: Secondary | ICD-10-CM | POA: Diagnosis not present

## 2023-12-08 DIAGNOSIS — R0789 Other chest pain: Secondary | ICD-10-CM | POA: Diagnosis not present

## 2023-12-08 DIAGNOSIS — B86 Scabies: Secondary | ICD-10-CM | POA: Diagnosis present

## 2023-12-08 DIAGNOSIS — Z9049 Acquired absence of other specified parts of digestive tract: Secondary | ICD-10-CM

## 2023-12-08 DIAGNOSIS — R1310 Dysphagia, unspecified: Secondary | ICD-10-CM | POA: Diagnosis not present

## 2023-12-08 DIAGNOSIS — R109 Unspecified abdominal pain: Secondary | ICD-10-CM

## 2023-12-08 DIAGNOSIS — N179 Acute kidney failure, unspecified: Secondary | ICD-10-CM | POA: Diagnosis present

## 2023-12-08 DIAGNOSIS — Z751 Person awaiting admission to adequate facility elsewhere: Secondary | ICD-10-CM

## 2023-12-08 DIAGNOSIS — Z5900 Homelessness unspecified: Secondary | ICD-10-CM | POA: Diagnosis not present

## 2023-12-08 DIAGNOSIS — Z885 Allergy status to narcotic agent status: Secondary | ICD-10-CM

## 2023-12-08 DIAGNOSIS — J9601 Acute respiratory failure with hypoxia: Secondary | ICD-10-CM | POA: Diagnosis present

## 2023-12-08 DIAGNOSIS — Z79899 Other long term (current) drug therapy: Secondary | ICD-10-CM

## 2023-12-08 DIAGNOSIS — F1410 Cocaine abuse, uncomplicated: Secondary | ICD-10-CM | POA: Diagnosis present

## 2023-12-08 DIAGNOSIS — R64 Cachexia: Secondary | ICD-10-CM | POA: Diagnosis present

## 2023-12-08 DIAGNOSIS — G8929 Other chronic pain: Secondary | ICD-10-CM | POA: Diagnosis present

## 2023-12-08 DIAGNOSIS — A419 Sepsis, unspecified organism: Secondary | ICD-10-CM | POA: Diagnosis not present

## 2023-12-08 DIAGNOSIS — Z91148 Patient's other noncompliance with medication regimen for other reason: Secondary | ICD-10-CM

## 2023-12-08 DIAGNOSIS — Z1152 Encounter for screening for COVID-19: Secondary | ICD-10-CM

## 2023-12-08 DIAGNOSIS — Z5982 Transportation insecurity: Secondary | ICD-10-CM

## 2023-12-08 DIAGNOSIS — Z882 Allergy status to sulfonamides status: Secondary | ICD-10-CM

## 2023-12-08 DIAGNOSIS — I959 Hypotension, unspecified: Secondary | ICD-10-CM | POA: Diagnosis not present

## 2023-12-08 DIAGNOSIS — A480 Gas gangrene: Secondary | ICD-10-CM | POA: Diagnosis not present

## 2023-12-08 DIAGNOSIS — M1711 Unilateral primary osteoarthritis, right knee: Secondary | ICD-10-CM | POA: Diagnosis present

## 2023-12-08 DIAGNOSIS — K649 Unspecified hemorrhoids: Secondary | ICD-10-CM | POA: Diagnosis present

## 2023-12-08 DIAGNOSIS — J189 Pneumonia, unspecified organism: Secondary | ICD-10-CM | POA: Diagnosis not present

## 2023-12-08 DIAGNOSIS — Z008 Encounter for other general examination: Secondary | ICD-10-CM

## 2023-12-08 DIAGNOSIS — R1010 Upper abdominal pain, unspecified: Secondary | ICD-10-CM | POA: Diagnosis not present

## 2023-12-08 DIAGNOSIS — D509 Iron deficiency anemia, unspecified: Secondary | ICD-10-CM | POA: Diagnosis present

## 2023-12-08 DIAGNOSIS — Z881 Allergy status to other antibiotic agents status: Secondary | ICD-10-CM

## 2023-12-08 DIAGNOSIS — G319 Degenerative disease of nervous system, unspecified: Secondary | ICD-10-CM | POA: Diagnosis present

## 2023-12-08 DIAGNOSIS — R635 Abnormal weight gain: Secondary | ICD-10-CM

## 2023-12-08 DIAGNOSIS — F4321 Adjustment disorder with depressed mood: Secondary | ICD-10-CM | POA: Diagnosis present

## 2023-12-08 DIAGNOSIS — Y92230 Patient room in hospital as the place of occurrence of the external cause: Secondary | ICD-10-CM | POA: Diagnosis not present

## 2023-12-08 DIAGNOSIS — R6520 Severe sepsis without septic shock: Secondary | ICD-10-CM | POA: Diagnosis present

## 2023-12-08 DIAGNOSIS — W57XXXA Bitten or stung by nonvenomous insect and other nonvenomous arthropods, initial encounter: Secondary | ICD-10-CM | POA: Diagnosis present

## 2023-12-08 DIAGNOSIS — R296 Repeated falls: Secondary | ICD-10-CM | POA: Diagnosis present

## 2023-12-08 DIAGNOSIS — Z6833 Body mass index (BMI) 33.0-33.9, adult: Secondary | ICD-10-CM

## 2023-12-08 DIAGNOSIS — K625 Hemorrhage of anus and rectum: Secondary | ICD-10-CM | POA: Diagnosis not present

## 2023-12-08 DIAGNOSIS — T368X5A Adverse effect of other systemic antibiotics, initial encounter: Secondary | ICD-10-CM | POA: Diagnosis not present

## 2023-12-08 DIAGNOSIS — Z90710 Acquired absence of both cervix and uterus: Secondary | ICD-10-CM

## 2023-12-08 DIAGNOSIS — S00259A Superficial foreign body of unspecified eyelid and periocular area, initial encounter: Secondary | ICD-10-CM

## 2023-12-08 DIAGNOSIS — F03B3 Unspecified dementia, moderate, with mood disturbance: Secondary | ICD-10-CM | POA: Diagnosis not present

## 2023-12-08 DIAGNOSIS — I5031 Acute diastolic (congestive) heart failure: Secondary | ICD-10-CM | POA: Diagnosis not present

## 2023-12-08 DIAGNOSIS — E46 Unspecified protein-calorie malnutrition: Secondary | ICD-10-CM | POA: Diagnosis present

## 2023-12-08 DIAGNOSIS — E876 Hypokalemia: Secondary | ICD-10-CM | POA: Diagnosis present

## 2023-12-08 DIAGNOSIS — Z91013 Allergy to seafood: Secondary | ICD-10-CM

## 2023-12-08 DIAGNOSIS — F17210 Nicotine dependence, cigarettes, uncomplicated: Secondary | ICD-10-CM | POA: Diagnosis present

## 2023-12-08 DIAGNOSIS — R159 Full incontinence of feces: Secondary | ICD-10-CM | POA: Diagnosis present

## 2023-12-08 LAB — URINALYSIS, W/ REFLEX TO CULTURE (INFECTION SUSPECTED)
Bacteria, UA: NONE SEEN
Bilirubin Urine: NEGATIVE
Glucose, UA: NEGATIVE mg/dL
Ketones, ur: 20 mg/dL — AB
Leukocytes,Ua: NEGATIVE
Nitrite: NEGATIVE
Protein, ur: 100 mg/dL — AB
Specific Gravity, Urine: 1.026 (ref 1.005–1.030)
Squamous Epithelial / HPF: 0 /[HPF] (ref 0–5)
pH: 5 (ref 5.0–8.0)

## 2023-12-08 LAB — RESP PANEL BY RT-PCR (RSV, FLU A&B, COVID)  RVPGX2
Influenza A by PCR: NEGATIVE
Influenza B by PCR: NEGATIVE
Resp Syncytial Virus by PCR: NEGATIVE
SARS Coronavirus 2 by RT PCR: NEGATIVE

## 2023-12-08 LAB — COMPREHENSIVE METABOLIC PANEL
ALT: 17 U/L (ref 0–44)
AST: 45 U/L — ABNORMAL HIGH (ref 15–41)
Albumin: 3 g/dL — ABNORMAL LOW (ref 3.5–5.0)
Alkaline Phosphatase: 55 U/L (ref 38–126)
Anion gap: 13 (ref 5–15)
BUN: 33 mg/dL — ABNORMAL HIGH (ref 6–20)
CO2: 20 mmol/L — ABNORMAL LOW (ref 22–32)
Calcium: 8.9 mg/dL (ref 8.9–10.3)
Chloride: 107 mmol/L (ref 98–111)
Creatinine, Ser: 1.18 mg/dL — ABNORMAL HIGH (ref 0.44–1.00)
GFR, Estimated: 57 mL/min — ABNORMAL LOW (ref >60)
Glucose, Bld: 117 mg/dL — ABNORMAL HIGH (ref 70–99)
Potassium: 2.6 mmol/L — CL (ref 3.5–5.1)
Sodium: 140 mmol/L (ref 135–145)
Total Bilirubin: 0.9 mg/dL (ref <1.2)
Total Protein: 8.4 g/dL — ABNORMAL HIGH (ref 6.5–8.1)

## 2023-12-08 LAB — APTT: aPTT: 34 s (ref 24–36)

## 2023-12-08 LAB — CBC WITH DIFFERENTIAL/PLATELET
Abs Immature Granulocytes: 0.05 10*3/uL (ref 0.00–0.07)
Basophils Absolute: 0 10*3/uL (ref 0.0–0.1)
Basophils Relative: 0 %
Eosinophils Absolute: 0 10*3/uL (ref 0.0–0.5)
Eosinophils Relative: 0 %
HCT: 34.3 % — ABNORMAL LOW (ref 36.0–46.0)
Hemoglobin: 10.8 g/dL — ABNORMAL LOW (ref 12.0–15.0)
Immature Granulocytes: 1 %
Lymphocytes Relative: 6 %
Lymphs Abs: 0.5 10*3/uL — ABNORMAL LOW (ref 0.7–4.0)
MCH: 27.1 pg (ref 26.0–34.0)
MCHC: 31.5 g/dL (ref 30.0–36.0)
MCV: 86.2 fL (ref 80.0–100.0)
Monocytes Absolute: 0.4 10*3/uL (ref 0.1–1.0)
Monocytes Relative: 4 %
Neutro Abs: 8.7 10*3/uL — ABNORMAL HIGH (ref 1.7–7.7)
Neutrophils Relative %: 89 %
Platelets: 144 10*3/uL — ABNORMAL LOW (ref 150–400)
RBC: 3.98 MIL/uL (ref 3.87–5.11)
RDW: 14.9 % (ref 11.5–15.5)
WBC: 9.6 10*3/uL (ref 4.0–10.5)
nRBC: 0 % (ref 0.0–0.2)

## 2023-12-08 LAB — BLOOD GAS, ARTERIAL
Acid-base deficit: 0.3 mmol/L (ref 0.0–2.0)
Bicarbonate: 21.5 mmol/L (ref 20.0–28.0)
FIO2: 60 %
MECHVT: 420 mL
Mechanical Rate: 18
O2 Saturation: 100 %
PEEP: 5 cmH2O
Patient temperature: 37
pCO2 arterial: 27 mm[Hg] — ABNORMAL LOW (ref 32–48)
pH, Arterial: 7.51 — ABNORMAL HIGH (ref 7.35–7.45)
pO2, Arterial: 139 mm[Hg] — ABNORMAL HIGH (ref 83–108)

## 2023-12-08 LAB — LACTIC ACID, PLASMA: Lactic Acid, Venous: 1.1 mmol/L (ref 0.5–1.9)

## 2023-12-08 LAB — LIPASE, BLOOD: Lipase: 24 U/L (ref 11–51)

## 2023-12-08 LAB — PROTIME-INR
INR: 1.4 — ABNORMAL HIGH (ref 0.8–1.2)
Prothrombin Time: 16.9 s — ABNORMAL HIGH (ref 11.4–15.2)

## 2023-12-08 LAB — ETHANOL: Alcohol, Ethyl (B): <10 mg/dL (ref <10)

## 2023-12-08 MED ORDER — SODIUM CHLORIDE 0.9 % IV SOLN
500.0000 mg | Freq: Once | INTRAVENOUS | Status: AC
  Administered 2023-12-08: 500 mg via INTRAVENOUS
  Filled 2023-12-08: qty 5

## 2023-12-08 MED ORDER — DOCUSATE SODIUM 50 MG/5ML PO LIQD: 100.0000 mg | Freq: Two times a day (BID) | ORAL | Status: DC | PRN

## 2023-12-08 MED ORDER — CEFTRIAXONE SODIUM 2 G IJ SOLR
2.0000 g | Freq: Once | INTRAMUSCULAR | Status: AC
  Administered 2023-12-08: 2 g via INTRAVENOUS
  Filled 2023-12-08: qty 20

## 2023-12-08 MED ORDER — SODIUM CHLORIDE 0.9 % IV BOLUS (SEPSIS)
2000.0000 mL | Freq: Once | INTRAVENOUS | Status: AC
  Administered 2023-12-08: 2000 mL via INTRAVENOUS

## 2023-12-08 MED ORDER — ROCURONIUM BROMIDE 10 MG/ML (PF) SYRINGE
PREFILLED_SYRINGE | INTRAVENOUS | Status: AC
  Administered 2023-12-08: 60 mg via INTRAVENOUS
  Filled 2023-12-08: qty 10

## 2023-12-08 MED ORDER — MIDAZOLAM-SODIUM CHLORIDE 100-0.9 MG/100ML-% IV SOLN
0.5000 mg/h | INTRAVENOUS | Status: DC
Start: 2023-12-08 — End: 2023-12-09
  Administered 2023-12-08: 0.5 mg/h via INTRAVENOUS
  Administered 2023-12-09: 5 mg/h via INTRAVENOUS
  Administered 2023-12-09: 1 mg/h via INTRAVENOUS
  Administered 2023-12-09: 5 mg/h via INTRAVENOUS
  Filled 2023-12-08: qty 100

## 2023-12-08 MED ORDER — FENTANYL CITRATE PF 50 MCG/ML IJ SOSY
100.0000 ug | PREFILLED_SYRINGE | Freq: Once | INTRAMUSCULAR | Status: AC
  Administered 2023-12-08: 100 ug via INTRAVENOUS
  Filled 2023-12-08: qty 2

## 2023-12-08 MED ORDER — ETOMIDATE 2 MG/ML IV SOLN
INTRAVENOUS | Status: AC
  Administered 2023-12-08: 20 mg via INTRAVENOUS
  Filled 2023-12-08: qty 20

## 2023-12-08 MED ORDER — FAMOTIDINE 20 MG PO TABS: 20.0000 mg | ORAL_TABLET | Freq: Two times a day (BID) | ORAL | Status: DC

## 2023-12-08 MED ORDER — FENTANYL 2500MCG IN NS 250ML (10MCG/ML) PREMIX INFUSION
0.0000 ug/h | INTRAVENOUS | Status: DC
  Administered 2023-12-08: 25 ug/h via INTRAVENOUS
  Administered 2023-12-09: 100 ug/h via INTRAVENOUS
  Administered 2023-12-09: 200 ug/h via INTRAVENOUS
  Filled 2023-12-08 (×2): qty 250

## 2023-12-08 MED ORDER — ETOMIDATE 2 MG/ML IV SOLN: 20.0000 mg | Freq: Once | INTRAVENOUS | Status: AC

## 2023-12-08 MED ORDER — POLYETHYLENE GLYCOL 3350 17 G PO PACK
17.0000 g | PACK | Freq: Every day | ORAL | Status: DC | PRN
Start: 2023-12-08 — End: 2023-12-11

## 2023-12-08 MED ORDER — ROCURONIUM BROMIDE 10 MG/ML (PF) SYRINGE: 60.0000 mg | PREFILLED_SYRINGE | Freq: Once | INTRAVENOUS | Status: AC

## 2023-12-08 MED ORDER — INSULIN ASPART 100 UNIT/ML IJ SOLN
0.0000 [IU] | INTRAMUSCULAR | Status: DC
  Administered 2023-12-09 – 2023-12-10 (×5): 1 [IU] via SUBCUTANEOUS
  Administered 2023-12-11: 2 [IU] via SUBCUTANEOUS
  Administered 2023-12-12: 1 [IU] via SUBCUTANEOUS
  Administered 2023-12-12: 2 [IU] via SUBCUTANEOUS
  Administered 2023-12-12 (×2): 1 [IU] via SUBCUTANEOUS
  Filled 2023-12-08 (×7): qty 1

## 2023-12-08 NOTE — Progress Notes (Signed)
Patient intubated and placed on vent. Facial scabbing and open skin sites note. Patient with skin cracking/open area noted to both corners of her mouth.  Large dry scab noted across upper lip and around nose.  Open skin site noted on chin. Et holder placed at bottom lip until further interventions are applied to these areas noted. Et tube secure.

## 2023-12-08 NOTE — H&P (Signed)
NAME:  Meghan Jarvis, MRN:  [id number], DOB:  12/07/1976, LOS: 0 ADMISSION DATE:  12/08/2023, CONSULTATION DATE:  12/08/23 REFERRING MD:  Alfonse Flavors CHIEF COMPLAINT:  unresponsiveness   HPI  47 y.o with significant PMH of EtOH abuse ,Cocaine use, Homelessness, Uncontrolled HIV, on BIKTARVY, A-fib, GERD, Hypothyroidism, Diabetes, who presented to the ED with chief complaints of unresponsiveness.  Per ED reports, EMS report that patient was boarding at a friend's house who noticed that she had not woken up the whole day. Patient's friend attempted to wake her up but noticed that she was unresponsive and making "gurgling" sound so they called EMS. On EMS arrival, patient was found to be unconscious and incontinent of urine and feces.     ED Course: Initial vital signs showed HR of beats/minute, BP mm Hg, the RR 30 breaths/minute, and the oxygen saturation % on and a temperature of 98.91F (36.9C). Patient was obtunded with no gag reflex with debris around the mouth and nose concerning for stomach contents. She was emergently intubated for airway protection. Pertinent Labs/Diagnostics Findings: Na+/ K+:140/2.6  Glucose: 117 BUN/Cr.:33/1.18 AST/ALT:45/17 WBC:9.6 K/L PCT: negative <0.10  Lactic acid: 1.1 COVID PCR: Negative,  ABG: pO2 139; pCO2 27; pH 7.51;  HCO3 21.5, %O2 Sat 100.  CXR>  Medication administered in the ED: Patient given 30 cc/kg of fluids and started on broad-spectrum antibiotics Ceftriaxone and Azithromycin for suspected sepsis Disposition:ICU  Past Medical History  EtOH abuse ,Cocaine use, Homelessness, Uncontrolled HIV, on BIKTARVY, A-fib, GERD, Hypothyroidism, Diabetes  Significant Hospital Events   12/28:  Consults:  PCCM  Procedures:  12/28: Intubation  Significant Diagnostic Tests:  12/28: Chest Xray>  12/28: Noncontrast CT head>  12/28: CTA Chest >  Interim History / Subjective:      Micro Data:  : SARS-CoV-2 PCR> negative : Influenza PCR>  negative : Blood culture x2> : Urine Culture> : MRSA PCR>>  : Strep pneumo urinary antigen> : Legionella urinary antigen>  Antimicrobials:  Vancomycin Cefepime Azithromycin Ceftriaxone Metronidazole  OBJECTIVE  Blood pressure 101/72, pulse (!) 150, resp. rate (!) 23, SpO2 99%.    Vent Mode: AC FiO2 (%):  [60 %] 60 % Set Rate:  [18 bmp] 18 bmp Vt Set:  [420 mL] 420 mL PEEP:  [5 cmH20] 5 cmH20  No intake or output data in the 24 hours ending 12/08/23 2250 There were no vitals filed for this visit.   Physical Examination  GENERAL:  year-old critically ill patient lying in the bed  EYES: PEERLA. No scleral icterus. Extraocular muscles intact.  HEENT: Head atraumatic, normocephalic. Oropharynx and nasopharynx clear.  NECK:  No JVD, supple  LUNGS: Normal breath sounds bilaterally.  No use of accessory muscles of respiration.  CARDIOVASCULAR: S1, S2 normal. No murmurs, rubs, or gallops.  ABDOMEN: Soft, NTND EXTREMITIES: No swelling or erythema.  Capillary refill < 3 seconds in all extremities. Pulses palpable distally. NEUROLOGIC: The patient is . No focal neurological deficit appreciated. Cranial nerves are intact.  SKIN: No obvious rash, lesion, or ulcer. Warm to touch Labs/imaging that I {ACTIONS; HAVE/HAVE NOT:19434}personally reviewed  (right click and "Reselect all SmartList Selections" daily)     Labs   CBC: Recent Labs  Lab 12/08/23 2213  WBC 9.6  NEUTROABS 8.7*  HGB 10.8*  HCT 34.3*  MCV 86.2  PLT 144*    Basic Metabolic Panel: No results for input(s): "NA", "K", "CL", "CO2", "GLUCOSE", "BUN", "CREATININE", "CALCIUM", "MG", "PHOS" in the last 168 hours. GFR: Estimated  Creatinine Clearance: 51.3 mL/min (by C-G formula based on SCr of 0.95 mg/dL). Recent Labs  Lab 12/08/23 2213  WBC 9.6  LATICACIDVEN 1.1    Liver Function Tests: No results for input(s): "AST", "ALT", "ALKPHOS", "BILITOT", "PROT", "ALBUMIN" in the last 168 hours. No results for  input(s): "LIPASE", "AMYLASE" in the last 168 hours. No results for input(s): "AMMONIA" in the last 168 hours.  ABG    Component Value Date/Time   PHART 7.51 (H) 12/08/2023 2234   PCO2ART 27 (L) 12/08/2023 2234   PO2ART 139 (H) 12/08/2023 2234   HCO3 21.5 12/08/2023 2234   ACIDBASEDEF 0.3 12/08/2023 2234   O2SAT 100 12/08/2023 2234     Coagulation Profile: Recent Labs  Lab 12/08/23 2213  INR 1.4*    Cardiac Enzymes: No results for input(s): "CKTOTAL", "CKMB", "CKMBINDEX", "TROPONINI" in the last 168 hours.  HbA1C: No results found for: "HGBA1C"  CBG: No results for input(s): "GLUCAP" in the last 168 hours.  Review of Systems:   ***  Past Medical History  She,  has a past medical history of Asthma, Depression, and HIV (human immunodeficiency virus infection) (HCC).   Surgical History    Past Surgical History:  Procedure Laterality Date   ABDOMINAL HYSTERECTOMY     APPENDECTOMY       Social History   reports that she has been smoking cigarettes. She has never used smokeless tobacco. She reports current alcohol use. She reports current drug use. Drug: Cocaine.   Family History   Her Family history is unknown by patient.   Allergies Allergies  Allergen Reactions   Fish Allergy Other (See Comments)    Crab legs result in itching  Crab legs result in itching  Crab legs result in itching   Shellfish Allergy Other (See Comments)    Crab legs result in itching   Buprenorphine Hcl     Pt states she not allergic   Morphine And Codeine Itching    hives     Home Medications  Prior to Admission medications   Medication Sig Start Date End Date Taking? Authorizing Provider  bictegravir-emtricitabine-tenofovir AF (BIKTARVY) 50-200-25 MG TABS tablet Take 1 tablet by mouth daily. Patient not taking: Reported on 11/28/2023    [provider]  cephALEXin (KEFLEX) 500 MG capsule Take 1 capsule (500 mg total) by mouth 2 (two) times daily. Patient not taking:  Reported on 11/28/2023 11/04/23   Ward, Layla Maw, DO  DULoxetine (CYMBALTA) 30 MG capsule Take 90 mg by mouth daily.    [provider]  gabapentin (NEURONTIN) 300 MG capsule Take 300 mg by mouth 3 (three) times daily.    [provider]  ibuprofen (ADVIL) 800 MG tablet Take 1 tablet (800 mg total) by mouth every 8 (eight) hours as needed for up to 5 days. 12/06/23 12/11/23  Gladys Damme, PA-C  polycarbophil (FIBERCON) 625 MG tablet Take 625 mg by mouth daily.    [provider]      Active Hospital Problem list   See systems below  Assessment & Plan:      Best practice:  Diet:  {TKZS:01093} Pain/Anxiety/Delirium protocol (if indicated): {Pain/Anxiety/Delirium:26941} VAP protocol (if indicated): {VAP:29640} DVT prophylaxis: {DVT Prophylaxis:26933} GI prophylaxis: {GI:26934} Glucose control:  {Glucose Control:26935} Central venous access:  {Central Venous Access:26936} Arterial line:  {Central Venous Access:26936} Foley:  {Central Venous Access:26936} Mobility:  {Mobility:26937}  PT consulted: {PT Consult:26938} Last date of multidisciplinary goals of care discussion [***] Code Status:  {Code Status:26939} Disposition: ***   =  Goals of Care = Code Status Order: @CODE @   Primary Emergency Contact: nipes,meredith, Home Phone: [phone] Wishes to pursue full aggressive treatment and intervention options, including CPR and intubation, but goals of care will be addressed on going with family if that should become necessary. Patient wishes to pursue ongoing treatment with relatively conservative measures (e.g., IV fluids, antibiotics), but would not wish to escalate to ICU level of care or other invasive measures. Patient wishes to pursue ongoing treatment, but concurred that if deteriorated to pulselessness, patient would prefer a natural death as opposed to invasive measures such as CPR and intubation.  Family should be immediately contacted in such  situations.  Critical care time: 45 minutes        Webb Silversmith DNP, CCRN, FNP-C, AGACNP-BC Acute Care & Family Nurse Practitioner [facility] PCCM on call pager [phone]

## 2023-12-08 NOTE — ED Provider Notes (Signed)
[facility] Provider Note    Event Date/Time   First MD Initiated Contact with Patient 12/08/23 2212     (approximate)   History   Chief Complaint: Altered Mental Status   HPI  Meghan Jarvis is a 47 y.o. female with a history of HIV, atrial fibrillation, polysubstance abuse who was brought to the ED due to being found unresponsive.  Patient was at her friend's house, was noted to not get up off the couch all day.  EMS report urinary and fecal incontinence.          Physical Exam   Triage Vital Signs: ED Triage Vitals  Encounter Vitals Group     BP 12/08/23 2200 90/60     Systolic BP Percentile --      Diastolic BP Percentile --      Pulse Rate 12/08/23 2159 (!) 115     Resp 12/08/23 2159 18     Temp --      Temp src --      SpO2 12/08/23 2159 98 %     Weight --      Height --      Head Circumference --      Peak Flow --      Pain Score --      Pain Loc --      Pain Education --      Exclude from Growth Chart --     Most recent vital signs: Vitals:   12/08/23 2200 12/08/23 2211  BP: 90/60 101/72  Pulse: (!) 115 (!) 150  Resp: (!) 25 (!) 23  SpO2: 98% 99%    General: Obtunded, no gag reflex.  Dry oral mucosa.  Debris around the mouth and nose concerning for emesis CV:  Good peripheral perfusion.  Tachycardia, heart rate 115, symmetric distal pulses Resp:  Normal effort.  Tachypnea, clear breath sounds bilaterally Abd:  No distention.  Nontender Other:  PERRL.  No apparent trauma, no open wounds or deformities or soft tissue changes   ED Results / Procedures / Treatments   Labs (all labs ordered are listed, but only abnormal results are displayed) Labs Reviewed  COMPREHENSIVE METABOLIC PANEL - Abnormal; Notable for the following components:      Result Value   Potassium 2.6 (*)    CO2 20 (*)    Glucose, Bld 117 (*)    BUN 33 (*)    Creatinine, Ser 1.18 (*)    Total Protein 8.4 (*)    Albumin 3.0 (*)    AST 45 (*)     GFR, Estimated 57 (*)    All other components within normal limits  CBC WITH DIFFERENTIAL/PLATELET - Abnormal; Notable for the following components:   Hemoglobin 10.8 (*)    HCT 34.3 (*)    Platelets 144 (*)    Neutro Abs 8.7 (*)    Lymphs Abs 0.5 (*)    All other components within normal limits  PROTIME-INR - Abnormal; Notable for the following components:   Prothrombin Time 16.9 (*)    INR 1.4 (*)    All other components within normal limits  BLOOD GAS, ARTERIAL - Abnormal; Notable for the following components:   pH, Arterial 7.51 (*)    pCO2 arterial 27 (*)    pO2, Arterial 139 (*)    All other components within normal limits  RESP PANEL BY RT-PCR (RSV, FLU A&B, COVID)  RVPGX2  CULTURE, BLOOD (ROUTINE X 2)  CULTURE, BLOOD (  ROUTINE X 2)  LACTIC ACID, PLASMA  APTT  LIPASE, BLOOD  LACTIC ACID, PLASMA  URINALYSIS, W/ REFLEX TO CULTURE (INFECTION SUSPECTED)  ETHANOL  URINE DRUG SCREEN, QUALITATIVE ([facility] ONLY)     EKG Interpreted by me Sinus tachycardia, heart rate 116.  Normal axis, normal intervals.  Normal QRS ST segments and T waves.   RADIOLOGY Chest x-ray interpreted by me, shows right lower lobe pneumonia.  Endotracheal tube in good position.  Radiology report reviewed   PROCEDURES:  .Critical Care  Performed by: Sharman Cheek, MD Authorized by: Sharman Cheek, MD   Critical care provider statement:    Critical care time (minutes):  35   Critical care time was exclusive of:  Separately billable procedures and treating other patients   Critical care was necessary to treat or prevent imminent or life-threatening deterioration of the following conditions:  Sepsis, CNS failure or compromise and dehydration   Critical care was time spent personally by me on the following activities:  Development of treatment plan with patient or surrogate, discussions with consultants, evaluation of patient's response to treatment, examination of patient, obtaining history  from patient or surrogate, ordering and performing treatments and interventions, ordering and review of laboratory studies, ordering and review of radiographic studies, pulse oximetry, re-evaluation of patient's condition and review of old charts   Care discussed with: admitting provider   Comments:           MEDICATIONS ORDERED IN ED: Medications  sodium chloride 0.9 % bolus 2,000 mL (2,000 mLs Intravenous New Bag/Given 12/08/23 2207)  cefTRIAXone (ROCEPHIN) 2 g in sodium chloride 0.9 % 100 mL IVPB (2 g Intravenous New Bag/Given 12/08/23 2244)  azithromycin (ZITHROMAX) 500 mg in sodium chloride 0.9 % 250 mL IVPB (has no administration in time range)  midazolam (VERSED) 100 mg/100 mL (1 mg/mL) premix infusion (0.5 mg/hr Intravenous New Bag/Given 12/08/23 2242)  fentaNYL in NS (62mcg/ml) infusion-PREMIX (25 mcg/hr Intravenous New Bag/Given 12/08/23 2239)  fentaNYL (SUBLIMAZE) injection 100 mcg (100 mcg Intravenous Given 12/08/23 2230)  etomidate (AMIDATE) injection 20 mg (20 mg Intravenous Given 12/08/23 2205)  rocuronium (ZEMURON) injection 60 mg (60 mg Intravenous Given 12/08/23 2205)     IMPRESSION / MDM / ASSESSMENT AND PLAN / ED COURSE  I reviewed the triage vital signs and the nursing notes.  DDx: Intoxication, sepsis, ketoacidosis, dehydration, AKI, electrolyte derangement  Patient's presentation is most consistent with acute presentation with potential threat to life or bodily function.  Patient presents with obtundation, fever tachycardia, suspected aspiration.  Intubated on arrival for airway protection.  Will give 2 L saline bolus, start empiric antibiotics with Rocephin and azithromycin, obtain labs and chest x-ray.  Will need to admit to ICU.    ----------------------------------------- 10:51 PM on 12/08/2023 ----------------------------------------- Chest x-ray looks like lobar pneumonia.  Case discussed with ICU team who will evaluate.       FINAL  CLINICAL IMPRESSION(S) / ED DIAGNOSES   Final diagnoses:  Pneumonia of right lower lobe due to infectious organism  Sepsis with encephalopathy without septic shock, due to unspecified organism [facility])     Rx / DC Orders   ED Discharge Orders     None        Note:  This document was prepared using Dragon voice recognition software and may include unintentional dictation errors.   Sharman Cheek, MD 12/08/23 2252

## 2023-12-08 NOTE — ED Notes (Signed)
20 of Etomidate and 60 of rocuronium in at this time. MD at bedside preparing to intubate.

## 2023-12-08 NOTE — Progress Notes (Signed)
Pt being followed by [facility] for Sepsis protocol. 

## 2023-12-08 NOTE — ED Notes (Signed)
Patient arrives unresponsive. Per EMS, patient is homeless and was at someone's house. Owners noticed the patient wasn't moving all day and tried to wake the patient up tonight and they noticed she was making a "gurgling" noise. CBG 112 in route. 103.7 axillary with EMS.

## 2023-12-08 NOTE — ED Notes (Signed)
7.5 ET tube placed by MD at this time. 23 at the teeth.

## 2023-12-08 NOTE — Progress Notes (Signed)
CODE SEPSIS - PHARMACY COMMUNICATION  **Broad Spectrum Antibiotics should be administered within 1 hour of Sepsis diagnosis**  Time Code Sepsis Called/Page Received: 2218  Antibiotics Ordered: Azithromycin & Ceftriaxone  Time of 1st antibiotic administration: 2244  Otelia Sergeant, PharmD, [facility] 12/08/2023 10:17 PM

## 2023-12-09 ENCOUNTER — Inpatient Hospital Stay: Payer: MEDICAID

## 2023-12-09 DIAGNOSIS — J189 Pneumonia, unspecified organism: Secondary | ICD-10-CM | POA: Diagnosis not present

## 2023-12-09 DIAGNOSIS — J9601 Acute respiratory failure with hypoxia: Secondary | ICD-10-CM | POA: Diagnosis not present

## 2023-12-09 LAB — URINE DRUG SCREEN, QUALITATIVE (ARMC ONLY)
Amphetamines, Ur Screen: NOT DETECTED
Barbiturates, Ur Screen: NOT DETECTED
Benzodiazepine, Ur Scrn: NOT DETECTED
Cannabinoid 50 Ng, Ur ~~LOC~~: NOT DETECTED
Cocaine Metabolite,Ur ~~LOC~~: POSITIVE — AB
MDMA (Ecstasy)Ur Screen: NOT DETECTED
Methadone Scn, Ur: NOT DETECTED
Opiate, Ur Screen: NOT DETECTED
Phencyclidine (PCP) Ur S: NOT DETECTED
Tricyclic, Ur Screen: NOT DETECTED

## 2023-12-09 LAB — BLOOD GAS, ARTERIAL
Acid-base deficit: 2.7 mmol/L — ABNORMAL HIGH (ref 0.0–2.0)
Bicarbonate: 22.6 mmol/L (ref 20.0–28.0)
FIO2: 50 %
MECHVT: 420 mL
Mechanical Rate: 14
O2 Saturation: 99.5 %
PEEP: 5 cmH2O
Patient temperature: 37
pCO2 arterial: 40 mm[Hg] (ref 32–48)
pH, Arterial: 7.36 (ref 7.35–7.45)
pO2, Arterial: 120 mm[Hg] — ABNORMAL HIGH (ref 83–108)

## 2023-12-09 LAB — GLUCOSE, CAPILLARY
Glucose-Capillary: 108 mg/dL — ABNORMAL HIGH (ref 70–99)
Glucose-Capillary: 110 mg/dL — ABNORMAL HIGH (ref 70–99)
Glucose-Capillary: 125 mg/dL — ABNORMAL HIGH (ref 70–99)
Glucose-Capillary: 125 mg/dL — ABNORMAL HIGH (ref 70–99)
Glucose-Capillary: 128 mg/dL — ABNORMAL HIGH (ref 70–99)
Glucose-Capillary: 95 mg/dL (ref 70–99)

## 2023-12-09 LAB — BASIC METABOLIC PANEL
Anion gap: 10 (ref 5–15)
Anion gap: 10 (ref 5–15)
BUN: 28 mg/dL — ABNORMAL HIGH (ref 6–20)
BUN: 27 mg/dL — ABNORMAL HIGH (ref 6–20)
CO2: 21 mmol/L — ABNORMAL LOW (ref 22–32)
CO2: 21 mmol/L — ABNORMAL LOW (ref 22–32)
Calcium: 8 mg/dL — ABNORMAL LOW (ref 8.9–10.3)
Calcium: 8.2 mg/dL — ABNORMAL LOW (ref 8.9–10.3)
Chloride: 110 mmol/L (ref 98–111)
Chloride: 111 mmol/L (ref 98–111)
Creatinine, Ser: 1.02 mg/dL — ABNORMAL HIGH (ref 0.44–1.00)
Creatinine, Ser: 0.9 mg/dL (ref 0.44–1.00)
GFR, Estimated: >60 mL/min (ref >60)
GFR, Estimated: >60 mL/min (ref >60)
Glucose, Bld: 133 mg/dL — ABNORMAL HIGH (ref 70–99)
Glucose, Bld: 117 mg/dL — ABNORMAL HIGH (ref 70–99)
Potassium: 2.6 mmol/L — CL (ref 3.5–5.1)
Potassium: 4.2 mmol/L (ref 3.5–5.1)
Sodium: 141 mmol/L (ref 135–145)
Sodium: 142 mmol/L (ref 135–145)

## 2023-12-09 LAB — MAGNESIUM
Magnesium: 2 mg/dL (ref 1.7–2.4)
Magnesium: 2.1 mg/dL (ref 1.7–2.4)
Magnesium: 2.2 mg/dL (ref 1.7–2.4)

## 2023-12-09 LAB — PROCALCITONIN: Procalcitonin: 2.22 ng/mL

## 2023-12-09 LAB — TSH: TSH: 0.244 u[IU]/mL — ABNORMAL LOW (ref 0.350–4.500)

## 2023-12-09 LAB — PHOSPHORUS
Phosphorus: 4.3 mg/dL (ref 2.5–4.6)
Phosphorus: 2.9 mg/dL (ref 2.5–4.6)
Phosphorus: 2.7 mg/dL (ref 2.5–4.6)

## 2023-12-09 LAB — CBC
HCT: 32.1 % — ABNORMAL LOW (ref 36.0–46.0)
Hemoglobin: 10.1 g/dL — ABNORMAL LOW (ref 12.0–15.0)
MCH: 27.1 pg (ref 26.0–34.0)
MCHC: 31.5 g/dL (ref 30.0–36.0)
MCV: 86.1 fL (ref 80.0–100.0)
Platelets: 142 10*3/uL — ABNORMAL LOW (ref 150–400)
RBC: 3.73 MIL/uL — ABNORMAL LOW (ref 3.87–5.11)
RDW: 14.7 % (ref 11.5–15.5)
WBC: 9 10*3/uL (ref 4.0–10.5)
nRBC: 0 % (ref 0.0–0.2)

## 2023-12-09 LAB — MRSA NEXT GEN BY PCR, NASAL: MRSA by PCR Next Gen: NOT DETECTED

## 2023-12-09 LAB — BRAIN NATRIURETIC PEPTIDE: B Natriuretic Peptide: 62.7 pg/mL (ref 0.0–100.0)

## 2023-12-09 LAB — LACTIC ACID, PLASMA: Lactic Acid, Venous: 1 mmol/L (ref 0.5–1.9)

## 2023-12-09 LAB — T4, FREE: Free T4: 0.72 ng/dL (ref 0.61–1.12)

## 2023-12-09 LAB — STREP PNEUMONIAE URINARY ANTIGEN: Strep Pneumo Urinary Antigen: NEGATIVE

## 2023-12-09 MED ORDER — IOHEXOL 350 MG/ML SOLN
75.0000 mL | Freq: Once | INTRAVENOUS | Status: AC | PRN
  Administered 2023-12-09: 75 mL via INTRAVENOUS

## 2023-12-09 MED ORDER — POTASSIUM CHLORIDE 10 MEQ/100ML IV SOLN
10.0000 meq | INTRAVENOUS | Status: DC
  Filled 2023-12-09: qty 100

## 2023-12-09 MED ORDER — ORAL CARE MOUTH RINSE
15.0000 mL | OROMUCOSAL | Status: DC
  Administered 2023-12-09 – 2023-12-10 (×17): 15 mL via OROMUCOSAL

## 2023-12-09 MED ORDER — VITAL HIGH PROTEIN PO LIQD
1000.0000 mL | ORAL | Status: DC
Start: 2023-12-09 — End: 2023-12-10
  Administered 2023-12-09: 1000 mL

## 2023-12-09 MED ORDER — ORAL CARE MOUTH RINSE: 15.0000 mL | OROMUCOSAL | Status: DC | PRN

## 2023-12-09 MED ORDER — NOREPINEPHRINE 4 MG/250ML-% IV SOLN
0.0000 ug/min | INTRAVENOUS | Status: DC
Start: 2023-12-09 — End: 2023-12-10
  Filled 2023-12-09: qty 250

## 2023-12-09 MED ORDER — LACTATED RINGERS IV BOLUS
1000.0000 mL | Freq: Once | INTRAVENOUS | Status: AC
  Administered 2023-12-09: 1000 mL via INTRAVENOUS

## 2023-12-09 MED ORDER — ACETAMINOPHEN 325 MG PO TABS
650.0000 mg | ORAL_TABLET | Freq: Four times a day (QID) | ORAL | Status: DC | PRN
  Administered 2023-12-09: 650 mg
  Filled 2023-12-09: qty 2

## 2023-12-09 MED ORDER — FAMOTIDINE IN NACL 20-0.9 MG/50ML-% IV SOLN
20.0000 mg | Freq: Two times a day (BID) | INTRAVENOUS | Status: DC
  Administered 2023-12-09 – 2023-12-10 (×3): 20 mg via INTRAVENOUS
  Filled 2023-12-09 (×3): qty 50

## 2023-12-09 MED ORDER — LORAZEPAM 2 MG/ML IJ SOLN
1.0000 mg | Freq: Two times a day (BID) | INTRAMUSCULAR | Status: DC
  Administered 2023-12-09 – 2023-12-10 (×4): 1 mg via INTRAVENOUS
  Filled 2023-12-09 (×4): qty 1

## 2023-12-09 MED ORDER — LACTATED RINGERS IV BOLUS
500.0000 mL | Freq: Once | INTRAVENOUS | Status: AC
  Administered 2023-12-09: 500 mL via INTRAVENOUS

## 2023-12-09 MED ORDER — MIDAZOLAM HCL 2 MG/2ML IJ SOLN
2.0000 mg | Freq: Once | INTRAMUSCULAR | Status: AC
  Administered 2023-12-09: 2 mg via INTRAVENOUS
  Filled 2023-12-09: qty 2

## 2023-12-09 MED ORDER — PROSOURCE TF20 ENFIT COMPATIBL EN LIQD
60.0000 mL | Freq: Every day | ENTERAL | Status: DC
Start: 2023-12-09 — End: 2023-12-10
  Administered 2023-12-09 – 2023-12-10 (×2): 60 mL

## 2023-12-09 MED ORDER — SODIUM CHLORIDE 0.9 % IV SOLN
500.0000 mg | INTRAVENOUS | Status: DC
  Administered 2023-12-09 – 2023-12-10 (×2): 500 mg via INTRAVENOUS
  Filled 2023-12-09 (×3): qty 5

## 2023-12-09 MED ORDER — LIDOCAINE HCL (PF) 1 % IJ SOLN
20.0000 mL | Freq: Once | INTRAMUSCULAR | Status: AC
  Administered 2023-12-09: 20 mL via SUBCUTANEOUS
  Filled 2023-12-09: qty 20

## 2023-12-09 MED ORDER — CHLORHEXIDINE GLUCONATE CLOTH 2 % EX PADS
6.0000 | MEDICATED_PAD | Freq: Every day | CUTANEOUS | Status: DC
Start: 2023-12-09 — End: 2023-12-15
  Administered 2023-12-09 – 2023-12-15 (×7): 6 via TOPICAL

## 2023-12-09 MED ORDER — LIDOCAINE 1% INJECTION FOR CIRCUMCISION: 20.0000 mL | INJECTION | Freq: Once | INTRAVENOUS | Status: DC

## 2023-12-09 MED ORDER — POTASSIUM CHLORIDE 10 MEQ/100ML IV SOLN
10.0000 meq | INTRAVENOUS | Status: AC
  Administered 2023-12-09 (×6): 10 meq via INTRAVENOUS
  Filled 2023-12-09 (×11): qty 100

## 2023-12-09 MED ORDER — PROPOFOL 1000 MG/100ML IV EMUL
5.0000 ug/kg/min | INTRAVENOUS | Status: DC
  Administered 2023-12-10: 15 ug/kg/min via INTRAVENOUS
  Filled 2023-12-09: qty 100

## 2023-12-09 MED ORDER — HEPARIN SODIUM (PORCINE) 5000 UNIT/ML IJ SOLN
5000.0000 [IU] | Freq: Three times a day (TID) | INTRAMUSCULAR | Status: DC
  Administered 2023-12-09 – 2023-12-11 (×6): 5000 [IU] via SUBCUTANEOUS
  Filled 2023-12-09 (×6): qty 1

## 2023-12-09 MED ORDER — PROPOFOL 1000 MG/100ML IV EMUL
5.0000 ug/kg/min | INTRAVENOUS | Status: DC
Start: 2023-12-09 — End: 2023-12-09
  Administered 2023-12-09: 5 ug/kg/min via INTRAVENOUS
  Filled 2023-12-09: qty 100

## 2023-12-09 MED ORDER — NOREPINEPHRINE 4 MG/250ML-% IV SOLN
INTRAVENOUS | Status: AC
  Administered 2023-12-09: 2 ug/min via INTRAVENOUS
  Filled 2023-12-09: qty 250

## 2023-12-09 MED ORDER — PIPERACILLIN-TAZOBACTAM 3.375 G IVPB
3.3750 g | Freq: Three times a day (TID) | INTRAVENOUS | Status: AC
  Administered 2023-12-09 – 2023-12-11 (×7): 3.375 g via INTRAVENOUS
  Filled 2023-12-09 (×7): qty 50

## 2023-12-09 MED ORDER — SODIUM CHLORIDE 0.9 % IV SOLN
2.0000 g | INTRAVENOUS | Status: DC
  Filled 2023-12-09: qty 20

## 2023-12-09 MED ORDER — SODIUM CHLORIDE 0.9 % IV SOLN
3.0000 g | Freq: Four times a day (QID) | INTRAVENOUS | Status: DC
  Administered 2023-12-09: 3 g via INTRAVENOUS
  Filled 2023-12-09 (×2): qty 8

## 2023-12-09 NOTE — Progress Notes (Signed)
NAME:  Meghan Jarvis, MRN:  [id number], DOB:  1976-07-16, LOS: 1 ADMISSION DATE:  12/08/2023  History of Present Illness:  Case of a 47 year old female patient with past medical history of alcohol use disorder, cocaine use, homelessness, uncontrolled HIV noncompliant with Biktarvy, A-fib, hypothyroidism, type 2 diabetes mellitus presenting to [facility] on 12/28 for acute hypoxic respiratory failure.  She was found down by her friend who attempted to wake her up all day however she would not and therefore called EMS.  On arrival she was found to be hypoxic to the 70s was placed on nonrebreather and transported to the emergency department.  In the ED she was noted to be obtunded very hypoxic and therefore she was intubated and mechanically ventilated.  Vitals with low-grade fever hypotensive with SBP in the 90s.  Labs with a left shift.  CTA chest without PE but did show a large consolidative opacity in the right lower lobe and right middle lobe with retained mucus debris within the trachea extending into the bronchus intermedius.  U-Tox positive for cocaine.  Unable to find information about her HIV, HIV viral load and last CD4 CD8 count.  Admitted to the ICU for further management.  Pertinent  Medical History  As above.   Significant Hospital Events: Including procedures, antibiotic start and stop dates in addition to other pertinent events   12/08/2023 Intubated and mechanically ventilated.   Interim History / Subjective:  Remains intubated, wakes up and is purposeful bit not following commands.   Objective   Blood pressure (!) 84/55, pulse 95, temperature 98.2 F (36.8 C), temperature source Bladder, resp. rate 14, height 5' 7.99" (1.727 m), weight 45 kg, SpO2 94%.    Vent Mode: PRVC FiO2 (%):  [30 %-60 %] 50 % Set Rate:  [14 bmp-18 bmp] 14 bmp Vt Set:  [420 mL] 420 mL PEEP:  [5 cmH20] 5 cmH20 Plateau Pressure:  [18 cmH20] 18 cmH20   Intake/Output  Summary (Last 24 hours) at 12/09/2023 0944 Last data filed at 12/09/2023 0700 Gross per 24 hour  Intake 4047.12 ml  Output 400 ml  Net 3647.12 ml   Filed Weights   12/09/23 0230  Weight: 45 kg    Examination: General: Intubated sedated HENT: Supple neck reactive pupils Lungs: Rhonchorous over the right hemithorax diminished overall. Cardiovascular: Normal S1, normal S2, irregularly irregular rhythm Abdomen: Soft nontender nondistended positive bowel sounds Extremities: Warm well-perfused no edema  Labs and imaging were reviewed  Assessment & Plan:  Case of a 47 year old female patient with past medical history of alcohol use disorder, cocaine use, homelessness, uncontrolled HIV noncompliant with Biktarvy, A-fib, hypothyroidism, type 2 diabetes mellitus presenting to [facility] on 12/28 for acute hypoxic respiratory failure.  She was found down by her friend who attempted to wake her up all day however she would not and therefore called EMS.  On arrival she was found to be hypoxic to the 70s was placed on nonrebreather and transported to the emergency department.  In the ED she was noted to be obtunded very hypoxic and therefore she was intubated and mechanically ventilated.  Found to have a large right lower lobe and middle lobe pneumonia with debris in the bronchi raising suspicion for aspiration.  # Acute hypoxic respiratory failure secondary to... # Community-acquired pneumonia versus aspiration pneumonia.  MRSA swab negative, urine strep antigen negative # HIV unclear if compliant with Biktarvy and no data regarding last viral load and CD4/CD8 count #  Substance abuse cocaine positive on U tox # Type II DM # Hypothyroidism  CNS: Versed for RASS 0 to -1.  Fentanyl for sleep at less than 2. CVS: Maps greater than 65. Pulmonary/ID: Bronchoscopy for sputum culture today and airway clearance.  Continue ceftriaxone azithromycin for now until further micro data.  HIV viral load pending. CD4/CD8 differential pending.  Continue lung protective ventilation currently on minimal vent settings. GI: Tube feeds.  Pepcid for prophylaxis. Renal: Trend creatinine daily, strict UOP monitor Heme: Heparin for DVT Prophylaxis,   Best Practice (right click and "Reselect all SmartList Selections" daily)   Diet/type: tubefeeds DVT prophylaxis prophylactic heparin  Pressure ulcer(s): N/A GI prophylaxis: H2B Lines: N/A Foley:  Yes, and it is still needed Code Status:  full code  Last date of multidisciplinary goals of care discussion [12/09/2023]  I spent 60 minutes caring for this patient today, including preparing to see the patient, obtaining a medical history , reviewing a separately obtained history, performing a medically appropriate examination and/or evaluation, ordering medications, tests, or procedures, referring and communicating with other health care professionals (not separately reported), documenting clinical information in the electronic health record, and independently interpreting results (not separately reported/billed) and communicating results to the patient/family/caregiver  [name], MD  Pulmonary Critical Care 12/09/2023 10:00 AM

## 2023-12-09 NOTE — Progress Notes (Signed)
eLink Physician-Brief Progress Note Patient Name: Meghan Jarvis DOB: 20-Oct-1976 MRN: [id number]   Date of Service  12/09/2023  HPI/Events of Note  New pt eval  eICU Interventions     Chart reviewed HIV Polysubstance abuse Resp failure Elevated ethanol level  Hydration  Mech ventilation Sedation Antibiotics      Deago Burruss 12/09/2023, 1:29 AM

## 2023-12-09 NOTE — Progress Notes (Signed)
PHARMACY CONSULT NOTE - FOLLOW UP  Pharmacy Consult for Electrolyte Monitoring and Replacement   Recent Labs: Potassium (mmol/L)  Date Value  12/09/2023 4.2  03/15/2015 4.2   Magnesium (mg/dL)  Date Value  16/09/9603 2.0   Calcium (mg/dL)  Date Value  54/08/8118 8.2 (L)   Calcium, Total (mg/dL)  Date Value  14/78/2956 8.5 (L)   Albumin (g/dL)  Date Value  21/30/8657 3.0 (L)  03/15/2015 3.4 (L)   Phosphorus (mg/dL)  Date Value  84/69/6295 4.3   Sodium (mmol/L)  Date Value  12/09/2023 142  03/15/2015 140    Assessment: 47 y.o w/ PMH of EtOH abuse ,Cocaine use, Homelessness, Uncontrolled HIV, on BIKTARVY, A-fib, GERD, Hypothyroidism, Diabetes, who presented to the ED with chief complaints of unresponsiveness. Pharmacy is asked to follow and replace electrolytes while in CCU  Goal of Therapy:  Electrolytes WNL  Plan:  ---no electrolyte replacement warranted for today ---recheck electrolytes in am  Lowella Bandy ,PharmD Clinical Pharmacist 12/09/2023 8:57 AM

## 2023-12-09 NOTE — Plan of Care (Signed)

## 2023-12-09 NOTE — Plan of Care (Signed)
°  Problem: Metabolic: Goal: Ability to maintain appropriate glucose levels will improve Outcome: Progressing   Problem: Tissue Perfusion: Goal: Adequacy of tissue perfusion will improve Outcome: Progressing   Problem: Safety: Goal: Ability to remain free from injury will improve Outcome: Progressing

## 2023-12-09 NOTE — Progress Notes (Signed)
Pharmacy Antibiotic Note  Meghan Jarvis is a 47 y.o. female admitted on 12/08/2023 with possible aspiration pneumonia.  Pharmacy has been consulted to transition to Zosyn.  Patient spike mild fever (Tmax 100.9) this morning with increasing vasopressor requirements. WBC 9.0, lactate 1.0. Underwent bronchoscopy today for sputum culture and airway clearance.  Plan: Stop Unasyn 3 gm IV every 6 hours  Start Zosyn 3.375 g IV every 8 hours Monitor renal function, clinical status, culture data, and LOT F/u bronchoscopy results  Pharmacy will continue to follow and will adjust abx dosing whenever warranted.  Temp (24hrs), Avg:98.8 F (37.1 C), Min:97 F (36.1 C), Max:101.4 F (38.6 C)   Recent Labs  Lab 12/08/23 2213 12/09/23 0148 12/09/23 0750  WBC 9.6 9.0  --   CREATININE 1.18* 1.02* 0.90  LATICACIDVEN 1.1 1.0  --     Estimated Creatinine Clearance: 53.7 mL/min (by C-G formula based on SCr of 0.9 mg/dL).    Allergies  Allergen Reactions   Fish Allergy Other (See Comments)    Crab legs result in itching  Crab legs result in itching  Crab legs result in itching   Shellfish Allergy Other (See Comments)    Crab legs result in itching   Buprenorphine Hcl     Pt states she not allergic   Morphine And Codeine Itching    hives    Antimicrobials this admission: 12/28 Ceftriaxone >> x 1 dose 12/28 Azithromycin >> x 1 dose 12/29 Unasyn  12/29 Zosyn   Microbiology results: 12/28 BCx: Pending 12/28 UCx: Pending   Thank you for involving pharmacy in this patient's care.   Rockwell Alexandria, PharmD Clinical Pharmacist 12/09/2023 6:46 PM

## 2023-12-09 NOTE — Progress Notes (Signed)
Pharmacy Antibiotic Note  Meghan Jarvis is a 47 y.o. female admitted on 12/08/2023 with possible aspiration pneumonia.  Pharmacy has been consulted for Unasyn dosing.  Plan: Unasyn 3 gm q6hr for 5 days per indication & renal fxn.  Pharmacy will continue to follow and will adjust abx dosing whenever warranted.  Temp (24hrs), Avg:98.6 F (37 C), Min:97 F (36.1 C), Max:101.4 F (38.6 C)   Recent Labs  Lab 12/08/23 2213 12/09/23 0148  WBC 9.6 9.0  CREATININE 1.18* 1.02*  LATICACIDVEN 1.1 1.0    Estimated Creatinine Clearance: 47.4 mL/min (A) (by C-G formula based on SCr of 1.02 mg/dL (H)).    Allergies  Allergen Reactions   Fish Allergy Other (See Comments)    Crab legs result in itching  Crab legs result in itching  Crab legs result in itching   Shellfish Allergy Other (See Comments)    Crab legs result in itching   Buprenorphine Hcl     Pt states she not allergic   Morphine And Codeine Itching    hives    Antimicrobials this admission: 12/28 Ceftriaxone >> x 1 dose 12/28 Azithromycin >> x 1 dose 12/29 Unasyn >> x 5 days  Microbiology results: 12/28 BCx: Pending 12/28 UCx: Pending   Thank you for allowing pharmacy to be a part of this patients care.  Otelia Sergeant, PharmD, [facility] 12/09/2023 4:43 AM

## 2023-12-09 NOTE — ED Notes (Signed)
ICU NP notified pt awake, Sedation restarted.

## 2023-12-09 NOTE — Procedures (Signed)
Bronchoscopy Procedure Note  Meghan Jarvis  [id number]  02/02/1976  Date:12/09/23  Time:3:31 PM   Provider Performing:Jean-Pierre Ryer Asato   Procedure(s):  Flexible Bronchoscopy (11914)  Indication(s) Diagnostic bronchscopopy and airway clearance  Consent Unable to obtain consent due to emergent nature of procedure.  Anesthesia Patient already intubated in the ICU  Time Out Verified patient identification, verified procedure, site/side was marked, verified correct patient position, special equipment/implants available, medications/allergies/relevant history reviewed, required imaging and test results available.  Sterile Technique Usual hand hygiene, masks, gowns, and gloves were used  Procedure Description Bronchoscope advanced through endotracheal tube and into airway.  Airways were examined down to subsegmental level with findings noted below.   Following diagnostic evaluation, BAL(s) performed in RLL with normal saline and return of 20cc fluid  Findings: Carina intact. RM, BI with purulent secretions cleared and did not recur. RML with some purulent secretions that were cleared and did not recur. RLL with extensive purulent secretions cleared and did not recur. No foreign body appreciated.   LM, LUL and LLL clear.   Complications/Tolerance None; patient tolerated the procedure well. Chest X-ray is not needed post procedure.  EBL Minimal  Specimen(s) BAL RLL sent for gram stain , culture and fungal culture.   Meghan Colonel, MD [facility] 12/09/2023 3:34 PM

## 2023-12-09 NOTE — Progress Notes (Signed)
Brief Nutrition Note  Consult received for enteral/tube feeding initiation and management.  Adult Enteral Nutrition Protocol initiated. Full assessment to follow.  NG/OG vented/dual lumen Oral 60 cm tube in place with tip located in stomach per xray imaging.   Admitting Dx: Altered mental status [R41.82] Pneumonia of right lower lobe due to infectious organism [J18.9] Sepsis with encephalopathy without septic shock, due to unspecified organism (HCC) [A41.9, R65.20, G93.41]  Body mass index is 15.09 kg/m.  Labs:  Recent Labs  Lab 12/08/23 2213 12/09/23 0148 12/09/23 0750  NA 140 141 142  K 2.6* 2.6* 4.2  CL 107 110 111  CO2 20* 21* 21*  BUN 33* 28* 27*  CREATININE 1.18* 1.02* 0.90  CALCIUM 8.9 8.0* 8.2*  MG  --  2.0  --   PHOS  --  4.3  --   GLUCOSE 117* 133* 117*   Monitor magnesium, potassium, and phosphorus BID for at least 3 days, MD to replete as needed, as pt is at risk for refeeding syndrome given declined oral intake. Add Thiamine 100 mg daily for 7 days  Meghan Jarvis RDN, LDN Clinical Dietitian   If unable to reach, please contact "RD Inpatient" secure chat group between 8 am-4 pm daily"

## 2023-12-10 DIAGNOSIS — E46 Unspecified protein-calorie malnutrition: Secondary | ICD-10-CM | POA: Diagnosis present

## 2023-12-10 DIAGNOSIS — E43 Unspecified severe protein-calorie malnutrition: Secondary | ICD-10-CM

## 2023-12-10 LAB — BASIC METABOLIC PANEL
Anion gap: 9 (ref 5–15)
BUN: 24 mg/dL — ABNORMAL HIGH (ref 6–20)
CO2: 22 mmol/L (ref 22–32)
Calcium: 8 mg/dL — ABNORMAL LOW (ref 8.9–10.3)
Chloride: 109 mmol/L (ref 98–111)
Creatinine, Ser: 0.71 mg/dL (ref 0.44–1.00)
GFR, Estimated: >60 mL/min (ref >60)
Glucose, Bld: 130 mg/dL — ABNORMAL HIGH (ref 70–99)
Potassium: 3.7 mmol/L (ref 3.5–5.1)
Sodium: 140 mmol/L (ref 135–145)

## 2023-12-10 LAB — CBC
HCT: 30.1 % — ABNORMAL LOW (ref 36.0–46.0)
Hemoglobin: 9.3 g/dL — ABNORMAL LOW (ref 12.0–15.0)
MCH: 26.9 pg (ref 26.0–34.0)
MCHC: 30.9 g/dL (ref 30.0–36.0)
MCV: 87 fL (ref 80.0–100.0)
Platelets: 124 10*3/uL — ABNORMAL LOW (ref 150–400)
RBC: 3.46 MIL/uL — ABNORMAL LOW (ref 3.87–5.11)
RDW: 15.6 % — ABNORMAL HIGH (ref 11.5–15.5)
WBC: 7.6 10*3/uL (ref 4.0–10.5)
nRBC: 0 % (ref 0.0–0.2)

## 2023-12-10 LAB — HELPER T-LYMPH-CD4 (ARMC ONLY)
% CD 4 Pos. Lymph.: 2.3 % — ABNORMAL LOW (ref 30.8–58.5)
Absolute CD 4 Helper: 12 /uL — ABNORMAL LOW (ref 359–1519)
Basophils Absolute: 0 10*3/uL (ref 0.0–0.2)
Basos: 0 %
EOS (ABSOLUTE): 0 10*3/uL (ref 0.0–0.4)
Eos: 0 %
Hematocrit: 31.5 % — ABNORMAL LOW (ref 34.0–46.6)
Hemoglobin: 9.7 g/dL — ABNORMAL LOW (ref 11.1–15.9)
Immature Grans (Abs): 0 10*3/uL (ref 0.0–0.1)
Immature Granulocytes: 0 %
Lymphocytes Absolute: 0.5 10*3/uL — ABNORMAL LOW (ref 0.7–3.1)
Lymphs: 7 %
MCH: 26.6 pg (ref 26.6–33.0)
MCHC: 30.8 g/dL — ABNORMAL LOW (ref 31.5–35.7)
MCV: 87 fL (ref 79–97)
Monocytes Absolute: 0.4 10*3/uL (ref 0.1–0.9)
Monocytes: 5 %
Neutrophils Absolute: 6.8 10*3/uL (ref 1.4–7.0)
Neutrophils: 88 %
Platelets: 127 10*3/uL — ABNORMAL LOW (ref 150–450)
RBC: 3.64 x10E6/uL — ABNORMAL LOW (ref 3.77–5.28)
RDW: 14.3 % (ref 11.7–15.4)
WBC: 7.7 10*3/uL (ref 3.4–10.8)

## 2023-12-10 LAB — HIV-1 RNA QUANT-NO REFLEX-BLD
HIV 1 RNA Quant: 2000000 {copies}/mL
LOG10 HIV-1 RNA: 6.301 {Log}

## 2023-12-10 LAB — GLUCOSE, CAPILLARY
Glucose-Capillary: 128 mg/dL — ABNORMAL HIGH (ref 70–99)
Glucose-Capillary: 123 mg/dL — ABNORMAL HIGH (ref 70–99)
Glucose-Capillary: 107 mg/dL — ABNORMAL HIGH (ref 70–99)
Glucose-Capillary: 101 mg/dL — ABNORMAL HIGH (ref 70–99)
Glucose-Capillary: 75 mg/dL (ref 70–99)
Glucose-Capillary: 69 mg/dL — ABNORMAL LOW (ref 70–99)
Glucose-Capillary: 130 mg/dL — ABNORMAL HIGH (ref 70–99)
Glucose-Capillary: 69 mg/dL — ABNORMAL LOW (ref 70–99)

## 2023-12-10 LAB — WET PREP, GENITAL
Clue Cells Wet Prep HPF POC: NONE SEEN
Sperm: NONE SEEN
Trich, Wet Prep: NONE SEEN
WBC, Wet Prep HPF POC: <10 (ref <10)
Yeast Wet Prep HPF POC: NONE SEEN

## 2023-12-10 LAB — MAGNESIUM
Magnesium: 2.1 mg/dL (ref 1.7–2.4)
Magnesium: 2 mg/dL (ref 1.7–2.4)

## 2023-12-10 LAB — CHLAMYDIA/NGC RT PCR (ARMC ONLY)
Chlamydia Tr: NOT DETECTED
N gonorrhoeae: NOT DETECTED

## 2023-12-10 LAB — PHOSPHORUS
Phosphorus: 2.8 mg/dL (ref 2.5–4.6)
Phosphorus: 2.7 mg/dL (ref 2.5–4.6)

## 2023-12-10 LAB — TRIGLYCERIDES: Triglycerides: 108 mg/dL (ref <150)

## 2023-12-10 LAB — HEMOGLOBIN A1C
Hgb A1c MFr Bld: 6 % — ABNORMAL HIGH (ref 4.8–5.6)
Mean Plasma Glucose: 126 mg/dL

## 2023-12-10 LAB — URINE CULTURE: Culture: <10000 — AB

## 2023-12-10 MED ORDER — DEXTROSE IN LACTATED RINGERS 5 % IV SOLN: INTRAVENOUS | Status: DC

## 2023-12-10 MED ORDER — DEXTROSE 50 % IV SOLN
12.5000 g | INTRAVENOUS | Status: AC
  Administered 2023-12-10: 12.5 g via INTRAVENOUS
  Filled 2023-12-10: qty 50

## 2023-12-10 MED ORDER — POTASSIUM CHLORIDE 20 MEQ PO PACK
40.0000 meq | PACK | Freq: Once | ORAL | Status: AC
  Administered 2023-12-10: 40 meq
  Filled 2023-12-10: qty 2

## 2023-12-10 MED ORDER — FAMOTIDINE 20 MG PO TABS: 20.0000 mg | ORAL_TABLET | Freq: Two times a day (BID) | ORAL | Status: DC

## 2023-12-10 MED ORDER — DEXTROSE 50 % IV SOLN: 12.5000 g | Freq: Once | INTRAVENOUS | Status: DC

## 2023-12-10 MED ORDER — DEXTROSE 10 % IV SOLN: INTRAVENOUS | Status: DC

## 2023-12-10 NOTE — Progress Notes (Signed)
PHARMACY CONSULT NOTE  Pharmacy Consult for Electrolyte Monitoring and Replacement   Recent Labs: Potassium (mmol/L)  Date Value  12/10/2023 3.7  03/15/2015 4.2   Magnesium (mg/dL)  Date Value  24/40/1027 2.1   Calcium (mg/dL)  Date Value  25/36/6440 8.0 (L)   Calcium, Total (mg/dL)  Date Value  34/74/2595 8.5 (L)   Albumin (g/dL)  Date Value  63/87/5643 3.0 (L)  03/15/2015 3.4 (L)   Phosphorus (mg/dL)  Date Value  32/95/1884 2.8   Sodium (mmol/L)  Date Value  12/10/2023 140  03/15/2015 140   Assessment: 47 y.o w/ PMH of EtOH abuse ,Cocaine use, Homelessness, Uncontrolled HIV, on BIKTARVY, A-fib, GERD, Hypothyroidism, Diabetes, who presented to the ED with chief complaints of unresponsiveness. Pharmacy is asked to follow and replace electrolytes while in CCU  Goal of Therapy:  Electrolytes WNL  Plan:  --K 3.7, Kcl 40 mEq per tube x 1 dose --Re-check electrolytes tomorrow AM  Tressie Ellis 12/10/2023 7:54 AM

## 2023-12-10 NOTE — Progress Notes (Addendum)
Nutrition Initial Assessment   DOCUMENTATION CODES:   Severe malnutrition in context of chronic illness  INTERVENTION:   If pt does not extubate:  Vital 1.2@55ml /hr- Intimate at 39ml/hr and increase by 26ml/hr q 8 hours until goal rate is reached.   Free water flushes 30ml q4 hours to maintain tube patency   Regimen provides 1584kcal/day, 99g/day protein and 1239ml/day of free water.   Pt at high refeed risk; recommend monitor potassium, magnesium and phosphorus labs daily until stable  Daily weights   NUTRITION DIAGNOSIS:   Severe Malnutrition related to chronic illness (HIV, homelessness, substance abuse) as evidenced by 35 percent weight loss in 1 year, severe fat depletion, severe muscle depletion.  GOAL:   Provide needs based on ASPEN/SCCM guidelines  MONITOR:   Vent status, Labs, Weight trends, TF tolerance, Skin, I & O's  ASSESSMENT:   47 y/o female with h/o homelessness, HIV, substance abuse, Afib, mood disorder, hypothyroidism, type 2 diabetes mellitus and depression who is admitted with aspiration PNA.  Pt sedated and ventilated. OGT in place. Pt previously receiving tube feeds via trickle rate; tube feeds currently on hold for possible extubation. Will resume tube feeds if pt does not extubate. Pt remains at high refeed risk. Per chart, pt appears to be down ~70lbs(35%) over the past year; this is severe weight loss.   Medications reviewed and include: pepcid, heparin, insulin, azithromycin, zosyn  Labs reviewed: K 3.7 wnl, BUN 24(H), P 2.8 wnl, Mg 2.1 wnl Hgb 9.3(L), Hct 30.1(L) Cbgs- 101, 107, 123 x 24 hrs AIC 6.0(H)- 12/29  Patient is currently intubated on ventilator support MV: 7.5 L/min Temp (24hrs), Avg:99.3 F (37.4 C), Min:98.4 F (36.9 C), Max:100.8 F (38.2 C)  Propofol: none   MAP- >38mmHg   UOP-   NUTRITION - FOCUSED PHYSICAL EXAM:  Flowsheet Row Most Recent Value  Orbital Region Moderate depletion  Upper Arm Region Severe  depletion  Thoracic and Lumbar Region Severe depletion  Buccal Region Severe depletion  Temple Region Severe depletion  Clavicle Bone Region Severe depletion  Clavicle and Acromion Bone Region Severe depletion  Scapular Bone Region Moderate depletion  Dorsal Hand Severe depletion  Patellar Region Severe depletion  Anterior Thigh Region Severe depletion  Posterior Calf Region Severe depletion  Edema (RD Assessment) None  Hair Reviewed  Eyes Reviewed  Mouth Reviewed  Skin Reviewed  Nails Reviewed   Diet Order:   Diet Order             Diet NPO time specified  Diet effective now                  EDUCATION NEEDS:   Not appropriate for education at this time  Skin:  Skin Assessment: Reviewed RN Assessment  Last BM:  12/29- type 7  Height:   Ht Readings from Last 1 Encounters:  12/09/23 5' 7.99" (1.727 m)    Weight:   Wt Readings from Last 1 Encounters:  12/10/23 59.2 kg    Ideal Body Weight:  63.6 kg  BMI:  Body mass index is 19.85 kg/m.  Estimated Nutritional Needs:   Kcal:  1618kcal/day  Protein:  90-100g/day  Fluid:  1.8-2.1L/day  Betsey Holiday MS, RD, LDN If unable to be reached, please send secure chat to "RD inpatient" available from 8:00a-4:00p daily

## 2023-12-10 NOTE — Progress Notes (Signed)
PT successfully extubated to 2L [place], per order. Saturations are 96% at this time.

## 2023-12-10 NOTE — Progress Notes (Signed)
NAME:  Meghan Jarvis, MRN:  [id number], DOB:  Apr 18, 1976, LOS: 2 ADMISSION DATE:  12/08/2023  History of Present Illness:  Case of a 47 year old female patient with past medical history of alcohol use disorder, cocaine use, homelessness, uncontrolled HIV noncompliant with Biktarvy, A-fib, hypothyroidism, type 2 diabetes mellitus presenting to [facility] on 12/28 for acute hypoxic respiratory failure.  She was found down by her friend who attempted to wake her up all day however she would not and therefore called EMS.  On arrival she was found to be hypoxic to the 70s was placed on nonrebreather and transported to the emergency department.  In the ED she was noted to be obtunded very hypoxic and therefore she was intubated and mechanically ventilated.  Vitals with low-grade fever hypotensive with SBP in the 90s.  Labs with a left shift.  CTA chest without PE but did show a large consolidative opacity in the right lower lobe and right middle lobe with retained mucus debris within the trachea extending into the bronchus intermedius.  U-Tox positive for cocaine.  Unable to find information about her HIV, HIV viral load and last CD4 CD8 count.  Admitted to the ICU for further management.  Pertinent  Medical History  As above.   Significant Hospital Events: Including procedures, antibiotic start and stop dates in addition to other pertinent events   12/08/2023 Intubated and mechanically ventilated.  12/10/23- patient on PRVC 28%, for SBT today.  We need ID involved in this case due to AIDS with HAART noncompliance.    Interim History / Subjective:  Remains intubated, wakes up and is purposeful bit not following commands.   Objective   Blood pressure 93/66, pulse 73, temperature 99.1 F (37.3 C), resp. rate 14, height 5' 7.99" (1.727 m), weight 59.2 kg, SpO2 100%.    Vent Mode: PRVC FiO2 (%):  [35 %] 35 % Set Rate:  [14 bmp] 14 bmp Vt Set:  [420 mL] 420 mL PEEP:   [5 cmH20] 5 cmH20 Plateau Pressure:  [14 cmH20] 14 cmH20   Intake/Output Summary (Last 24 hours) at 12/10/2023 0749 Last data filed at 12/10/2023 0700 Gross per 24 hour  Intake 3340.17 ml  Output 1625 ml  Net 1715.17 ml   Filed Weights   12/09/23 0230 12/09/23 1100 12/10/23 0500  Weight: 45 kg 55 kg 59.2 kg    Examination: General: Intubated sedated GCS7T HENT: Supple neck reactive pupils Lungs: mild rhonchi Cardiovascular: Normal S1, normal S2, irregularly irregular rhythm Abdomen: Soft nontender nondistended positive bowel sounds Extremities: Warm well-perfused no edema  Labs and imaging were reviewed  Assessment & Plan:  Case of a 47 year old female patient with past medical history of alcohol use disorder, cocaine use, homelessness, uncontrolled HIV noncompliant with Biktarvy, A-fib, hypothyroidism, type 2 diabetes mellitus presenting to [facility] on 12/28 for acute hypoxic respiratory failure.  She was found down by her friend who attempted to wake her up all day however she would not and therefore called EMS.  On arrival she was found to be hypoxic to the 70s was placed on nonrebreather and transported to the emergency department.  In the ED she was noted to be obtunded very hypoxic and therefore she was intubated and mechanically ventilated.  Found to have a large right lower lobe and middle lobe pneumonia with debris in the bronchi raising suspicion for aspiration.  # Acute hypoxic respiratory failure secondary to aspiration pneumonia.  MRSA swab negative, urine strep antigen negative #  HIV unclear if compliant with Biktarvy and no data regarding last viral load and CD4/CD8 count # Substance abuse cocaine positive on U tox # Type II DM # Hypothyroidism  Best Practice (right click and "Reselect all SmartList Selections" daily)   Diet/type: tubefeeds DVT prophylaxis prophylactic heparin  Pressure ulcer(s): N/A GI prophylaxis: H2B Lines: N/A Foley:   Yes, and it is still needed Code Status:  full code  Critical care provider statement:   Total critical care time:  63 minutes   Performed by: Karna Christmas MD   Critical care time was exclusive of separately billable procedures and treating other patients.   Critical care was necessary to treat or prevent imminent or life-threatening deterioration.   Critical care was time spent personally by me on the following activities: development of treatment plan with patient and/or surrogate as well as nursing, discussions with consultants, evaluation of patient's response to treatment, examination of patient, obtaining history from patient or surrogate, ordering and performing treatments and interventions, ordering and review of laboratory studies, ordering and review of radiographic studies, pulse oximetry and re-evaluation of patient's condition.    Vida Rigger, M.D.  Pulmonary & Critical Care Medicine

## 2023-12-10 NOTE — Plan of Care (Signed)
  Problem: Education: Goal: Ability to describe self-care measures that may prevent or decrease complications (Diabetes Survival Skills Education) will improve Outcome: Progressing Goal: Individualized Educational Video(s) Outcome: Progressing   Problem: Coping: Goal: Ability to adjust to condition or change in health will improve Outcome: Progressing   Problem: Fluid Volume: Goal: Ability to maintain a balanced intake and output will improve Outcome: Progressing   Problem: Health Behavior/Discharge Planning: Goal: Ability to identify and utilize available resources and services will improve Outcome: Progressing Goal: Ability to manage health-related needs will improve Outcome: Progressing   Problem: Metabolic: Goal: Ability to maintain appropriate glucose levels will improve Outcome: Progressing   Problem: Nutritional: Goal: Maintenance of adequate nutrition will improve Outcome: Progressing Goal: Progress toward achieving an optimal weight will improve Outcome: Progressing   Problem: Skin Integrity: Goal: Risk for impaired skin integrity will decrease Outcome: Progressing   Problem: Tissue Perfusion: Goal: Adequacy of tissue perfusion will improve Outcome: Progressing   Problem: Education: Goal: Knowledge of General Education information will improve Description: Including pain rating scale, medication(s)/side effects and non-pharmacologic comfort measures Outcome: Progressing   Problem: Health Behavior/Discharge Planning: Goal: Ability to manage health-related needs will improve Outcome: Progressing   Problem: Clinical Measurements: Goal: Ability to maintain clinical measurements within normal limits will improve Outcome: Progressing Goal: Will remain free from infection Outcome: Progressing Goal: Diagnostic test results will improve Outcome: Progressing Goal: Respiratory complications will improve Outcome: Progressing Goal: Cardiovascular complication will  be avoided Outcome: Progressing   Problem: Activity: Goal: Risk for activity intolerance will decrease Outcome: Progressing   Problem: Nutrition: Goal: Adequate nutrition will be maintained Outcome: Progressing   Problem: Coping: Goal: Level of anxiety will decrease Outcome: Progressing   Problem: Elimination: Goal: Will not experience complications related to bowel motility Outcome: Progressing Goal: Will not experience complications related to urinary retention Outcome: Progressing   Problem: Pain Management: Goal: General experience of comfort will improve Outcome: Progressing   Problem: Safety: Goal: Ability to remain free from injury will improve Outcome: Progressing   Problem: Skin Integrity: Goal: Risk for impaired skin integrity will decrease Outcome: Progressing   Problem: Activity: Goal: Ability to tolerate increased activity will improve Outcome: Progressing   Problem: Respiratory: Goal: Ability to maintain a clear airway and adequate ventilation will improve Outcome: Progressing

## 2023-12-10 NOTE — Consult Note (Signed)
 NAME: Meghan Jarvis  DOB: 06-29-1976  MRN: [id number]  Date/Time: 12/10/2023 4:25 PM  REQUESTING PROVIDER; Dr. Parris Subjective:  REASON FOR CONSULT: AIDS ? Meghan Jarvis is a 47 y.o. with a history of AIDS, not compliant with meds or visits ,  , cocaine use was brought in by EMS after being found unresponsive on the couch  in an aquaintance place-   12/08/23  BP 100/77  Temp 98.4 F (36.9 C)  Pulse Rate 101 !  Resp 12  SpO2 94 %    Latest Reference Range & Units 12/08/23  WBC 4.0 - 10.5 K/uL 9.6  Hemoglobin 12.0 - 15.0 g/dL 89.1 (L)  HCT 63.9 - 53.9 % 34.3 (L)  Platelets 150 - 400 K/uL 144 (L)  Creatinine 0.44 - 1.00 mg/dL 8.81 (H)   Pt was obtunded in the ED with no gag reflex and debris around the mouth and nose concerning for vomitus. She was intubated for air way protection CT head no acute changes CTA chest showed rt lung volume loss with subtotal collapse/consolidation of rt lower and middle lobes Blood culture sent and started on zosyn  Had bronchoscopy on 12/29 and purulent secretions cleared Pt has not been in HIV care since 2021 Used to be on Biktarvy  Pt states she does nto go to appts as she has no transport- she can get medicaid transportation but she is homeless and has no way to arrange for transportation She also drinks alcohol and does cocaine She has been to ED many times brought in by EMS/police for falls and being under influence Pt was extubated on 12/10/23 She is more alert today Has thin walled blisters in different areas of the body Says she is itchy   Past Medical History:  Diagnosis Date   Asthma    Depression    HIV (human immunodeficiency virus infection) (HCC)     Past Surgical History:  Procedure Laterality Date   ABDOMINAL HYSTERECTOMY     APPENDECTOMY      Social History   Socioeconomic History   Marital status: Divorced    Spouse name: Not on file   Number of children: Not on file   Years of education: Not on file    Highest education level: Not on file  Occupational History   Not on file  Tobacco Use   Smoking status: Every Day    Current packs/day: 1.00    Types: Cigarettes   Smokeless tobacco: Never  Substance and Sexual Activity   Alcohol use: Yes   Drug use: Yes    Types: Cocaine   Sexual activity: Yes    Birth control/protection: Surgical  Other Topics Concern   Not on file  Social History Narrative   Not on file   Social Drivers of Health   Financial Resource Strain: Not on file  Food Insecurity: Patient Unable To Answer (12/09/2023)   Hunger Vital Sign    Worried About Running Out of Food in the Last Year: Patient unable to answer    Ran Out of Food in the Last Year: Patient unable to answer  Recent Concern: Food Insecurity - Food Insecurity Present (09/26/2023)   Hunger Vital Sign    Worried About Running Out of Food in the Last Year: Often true    Ran Out of Food in the Last Year: Sometimes true  Transportation Needs: Patient Unable To Answer (12/09/2023)   PRAPARE - Transportation    Lack of Transportation (Medical): Patient unable to answer  Lack of Transportation (Non-Medical): Patient unable to answer  Recent Concern: Transportation Needs - Unmet Transportation Needs (10/11/2023)   PRAPARE - Administrator, Civil Service (Medical): Yes    Lack of Transportation (Non-Medical): Patient unable to answer  Physical Activity: Not on file  Stress: Not on file  Social Connections: Not on file  Intimate Partner Violence: Patient Unable To Answer (12/09/2023)   Humiliation, Afraid, Rape, and Kick questionnaire    Fear of Current or Ex-Partner: Patient unable to answer    Emotionally Abused: Patient unable to answer    Physically Abused: Patient unable to answer    Sexually Abused: Patient unable to answer    Family History  Family history unknown: Yes   Allergies  Allergen Reactions   Fish Allergy Other (See Comments)    Crab legs result in itching  Crab  legs result in itching  Crab legs result in itching   Shellfish Allergy Other (See Comments)    Crab legs result in itching   Buprenorphine Hcl     Pt states she not allergic   Morphine  And Codeine Itching    hives   I? Current Facility-Administered Medications  Medication Dose Route Frequency Provider Last Rate Last Admin   acetaminophen  (TYLENOL ) tablet 650 mg  650 mg Per Tube Q6H PRN Assaker, Darrin, MD   650 mg at 12/09/23 1545   azithromycin  (ZITHROMAX ) 500 mg in sodium chloride  0.9 % 250 mL IVPB  500 mg Intravenous Q24H Assaker, Darrin, MD   Stopped at 12/09/23 2349   Chlorhexidine  Gluconate Cloth 2 % PADS 6 each  6 each Topical Daily Assaker, Darrin, MD   6 each at 12/10/23 0954   docusate (COLACE) 50 MG/5ML liquid 100 mg  100 mg Per Tube BID PRN [name], Elizabeth Achieng, NP       famotidine  (PEPCID ) tablet 20 mg  20 mg Per Tube BID Chappell, Alex B, RPH       heparin  injection 5,000 Units  5,000 Units Subcutaneous Q8H Assaker, Darrin, MD   5,000 Units at 12/10/23 0540   insulin  aspart (novoLOG ) injection 0-9 Units  0-9 Units Subcutaneous Q4H Kathrene Almarie Bake, NP   1 Units at 12/10/23 0424   LORazepam  (ATIVAN ) injection 1 mg  1 mg Intravenous BID Assaker, Darrin, MD   1 mg at 12/10/23 9045   piperacillin -tazobactam (ZOSYN ) IVPB 3.375 g  3.375 g Intravenous Q8H Elesa Perkins, RPH 12.5 mL/hr at 12/10/23 1134 3.375 g at 12/10/23 1134   polyethylene glycol (MIRALAX  / GLYCOLAX ) packet 17 g  17 g Per Tube Daily PRN Kathrene Almarie Bake, NP         Abtx:  Anti-infectives (From admission, onward)    Start     Dose/Rate Route Frequency Ordered Stop   12/09/23 2200  azithromycin  (ZITHROMAX ) 500 mg in sodium chloride  0.9 % 250 mL IVPB        500 mg 250 mL/hr over 60 Minutes Intravenous Every 24 hours 12/09/23 0734 12/12/23 2159   12/09/23 2000  cefTRIAXone  (ROCEPHIN ) 2 g in sodium chloride  0.9 % 100 mL IVPB  Status:  Discontinued        2 g 200 mL/hr  over 30 Minutes Intravenous Every 24 hours 12/09/23 0953 12/09/23 1840   12/09/23 2000  piperacillin -tazobactam (ZOSYN ) IVPB 3.375 g        3.375 g 12.5 mL/hr over 240 Minutes Intravenous Every 8 hours 12/09/23 1850     12/09/23 0600  Ampicillin -Sulbactam (UNASYN ) 3 g in  sodium chloride  0.9 % 100 mL IVPB  Status:  Discontinued        3 g 200 mL/hr over 30 Minutes Intravenous Every 6 hours 12/09/23 0442 12/09/23 0953   12/08/23 2215  cefTRIAXone  (ROCEPHIN ) 2 g in sodium chloride  0.9 % 100 mL IVPB        2 g 200 mL/hr over 30 Minutes Intravenous Once 12/08/23 2214 12/08/23 2308   12/08/23 2215  azithromycin  (ZITHROMAX ) 500 mg in sodium chloride  0.9 % 250 mL IVPB        500 mg 250 mL/hr over 60 Minutes Intravenous  Once 12/08/23 2214 12/09/23 0000       REVIEW OF SYSTEMS:  Const: negative fever, negative chills, has weight loss Eyes: negative diplopia or visual changes, negative eye pain ENT: negative coryza, negative sore throat Resp: cough,no hemoptysis, dyspnea Cards: negative for chest pain, palpitations, lower extremity edema GU: negative for frequency, dysuria and hematuria GI: Negative for abdominal pain, diarrhea, bleeding, constipation Skin:   pruritus Heme: negative for easy bruising and gum/nose bleeding MS: back pain and leg pain,  falls Neurolo:headache Psych:  anxiety, depression  Endocrine: thyroid, diabetes Allergy/Immunology- as above Objective:  VITALS:  BP 103/78   Pulse 91   Temp 99.5 F (37.5 C)   Resp 12   Ht 5' 7.99 (1.727 m)   Wt 59.2 kg   SpO2 98%   BMI 19.85 kg/m   PHYSICAL EXAM:  General: Awake   no distress, emaciated Head: Normocephalic, without obvious abnormality, atraumatic. Eyes: Conjunctivae clear, anicteric sclerae. Pupils are equal ENT Nares normal. No drainage or sinus tenderness. Lips, mucosa, N and tongue bald. No Thrush Neck: Supple, symmetrical, no adenopathy, thyroid: non tender no carotid bruit and no JVD. Back: No CVA  tenderness. Lungs: b/l air entry- crepts Rt base  Heart:Tachycardia. Abdomen: Soft, non-tender,not distended. Bowel sounds normal. No masses Extremities: Left thumb swollen.tender to touch  2 superficial blisters     Skin: no obvious burrows or scabs on the wrist, umbilicus area to suggest scabies Lymph: Cervical, supraclavicular normal. Neurologic: Grossly non-focal Pertinent Labs Lab Results CBC    Component Value Date/Time   WBC 7.6 12/10/2023 0348   RBC 3.46 (L) 12/10/2023 0348   HGB 9.3 (L) 12/10/2023 0348   HGB 9.7 (L) 12/09/2023 0326   HCT 30.1 (L) 12/10/2023 0348   HCT 31.5 (L) 12/09/2023 0326   PLT 124 (L) 12/10/2023 0348   PLT 127 (L) 12/09/2023 0326   MCV 87.0 12/10/2023 0348   MCV 87 12/09/2023 0326   MCV 84 03/15/2015 0556   MCH 26.9 12/10/2023 0348   MCHC 30.9 12/10/2023 0348   RDW 15.6 (H) 12/10/2023 0348   RDW 14.3 12/09/2023 0326   RDW 14.9 (H) 03/15/2015 0556   LYMPHSABS 0.5 (L) 12/09/2023 0326   LYMPHSABS 1.3 08/18/2014 1354   MONOABS 0.4 12/08/2023 2213   MONOABS 0.2 08/18/2014 1354   EOSABS 0.0 12/09/2023 0326   EOSABS 0.1 08/18/2014 1354   BASOSABS 0.0 12/09/2023 0326   BASOSABS 0.1 08/18/2014 1354   BASOSABS 0 06/17/2014 0126       Latest Ref Rng & Units 12/10/2023    3:48 AM 12/09/2023    7:50 AM 12/09/2023    1:48 AM  CMP  Glucose 70 - 99 mg/dL 869  882  866   BUN 6 - 20 mg/dL 24  27  28    Creatinine 0.44 - 1.00 mg/dL 9.28  9.09  8.97   Sodium 135 - 145  mmol/L 140  142  141   Potassium 3.5 - 5.1 mmol/L 3.7  4.2  2.6   Chloride 98 - 111 mmol/L 109  111  110   CO2 22 - 32 mmol/L 22  21  21    Calcium  8.9 - 10.3 mg/dL 8.0  8.2  8.0       Microbiology: Recent Results (from the past 240 hours)  Blood Culture (routine x 2)     Status: None (Preliminary result)   Collection Time: 12/08/23 10:13 PM   Specimen: BLOOD  Result Value Ref Range Status   Specimen Description BLOOD RIGHT ANTECUBITAL  Final   Special Requests   Final     BOTTLES DRAWN AEROBIC AND ANAEROBIC Blood Culture results may not be optimal due to an inadequate volume of blood received in culture bottles   Culture   Final    NO GROWTH 2 DAYS Performed at [facility], [address] [place], [place] [address]    Report Status PENDING  Incomplete  Blood Culture (routine x 2)     Status: None (Preliminary result)   Collection Time: 12/08/23 10:13 PM   Specimen: BLOOD  Result Value Ref Range Status   Specimen Description BLOOD LEFT ANTECUBITAL  Final   Special Requests   Final    BOTTLES DRAWN AEROBIC AND ANAEROBIC Blood Culture results may not be optimal due to an inadequate volume of blood received in culture bottles   Culture   Final    NO GROWTH 2 DAYS Performed at [facility], [address] [place], [place] [address]    Report Status PENDING  Incomplete  Resp panel by RT-PCR (RSV, Flu A&B, Covid) Anterior Nasal Swab     Status: None   Collection Time: 12/08/23 10:17 PM   Specimen: Anterior Nasal Swab  Result Value Ref Range Status   SARS Coronavirus 2 by RT PCR NEGATIVE NEGATIVE Final    Comment: (NOTE) SARS-CoV-2 target nucleic acids are NOT DETECTED.  The SARS-CoV-2 RNA is generally detectable in upper respiratory specimens during the acute phase of infection. The lowest concentration of SARS-CoV-2 viral copies this assay can detect is 138 copies/mL. A negative result does not preclude SARS-Cov-2 infection and should not be used as the sole basis for treatment or other patient management decisions. A negative result may occur with  improper specimen collection/handling, submission of specimen other than nasopharyngeal swab, presence of viral mutation(s) within the areas targeted by this assay, and inadequate number of viral copies(<138 copies/mL). A negative result must be combined with clinical observations, patient history, and epidemiological information. The expected result is Negative.  Fact Sheet for  Patients:  bloggercourse.com  Fact Sheet for Healthcare Providers:  seriousbroker.it  This test is no t yet approved or cleared by the [place]  FDA and  has been authorized for detection and/or diagnosis of SARS-CoV-2 by FDA under an Emergency Use Authorization (EUA). This EUA will remain  in effect (meaning this test can be used) for the duration of the COVID-19 declaration under Section 564(b)(1) of the Act, 21 U.S.C.section 360bbb-3(b)(1), unless the authorization is terminated  or revoked sooner.       Influenza A by PCR NEGATIVE NEGATIVE Final   Influenza B by PCR NEGATIVE NEGATIVE Final    Comment: (NOTE) The Xpert Xpress SARS-CoV-2/FLU/RSV plus assay is intended as an aid in the diagnosis of influenza from Nasopharyngeal swab specimens and should not be used as a sole basis for treatment. Nasal washings and aspirates  are unacceptable for Xpert Xpress SARS-CoV-2/FLU/RSV testing.  Fact Sheet for Patients: bloggercourse.com  Fact Sheet for Healthcare Providers: seriousbroker.it  This test is not yet approved or cleared by the [place]  FDA and has been authorized for detection and/or diagnosis of SARS-CoV-2 by FDA under an Emergency Use Authorization (EUA). This EUA will remain in effect (meaning this test can be used) for the duration of the COVID-19 declaration under Section 564(b)(1) of the Act, 21 U.S.C. section 360bbb-3(b)(1), unless the authorization is terminated or revoked.     Resp Syncytial Virus by PCR NEGATIVE NEGATIVE Final    Comment: (NOTE) Fact Sheet for Patients: bloggercourse.com  Fact Sheet for Healthcare Providers: seriousbroker.it  This test is not yet approved or cleared by the [place]  FDA and has been authorized for detection and/or diagnosis of SARS-CoV-2 by FDA under an Emergency  Use Authorization (EUA). This EUA will remain in effect (meaning this test can be used) for the duration of the COVID-19 declaration under Section 564(b)(1) of the Act, 21 U.S.C. section 360bbb-3(b)(1), unless the authorization is terminated or revoked.  Performed at [facility], [address], [place], [place] [address]   Urine Culture     Status: Abnormal   Collection Time: 12/08/23 10:49 PM   Specimen: Urine, Random  Result Value Ref Range Status   Specimen Description   Final    URINE, RANDOM Performed at [facility], [address] [place], [place] [address]    Special Requests   Final    NONE Reflexed from [id number] Performed at [facility], [address] [place], [place] [address]    Culture (A)  Final    <10,000 COLONIES/mL INSIGNIFICANT GROWTH Performed at [facility], [address] [place], [place] [address]    Report Status 12/10/2023 FINAL  Final  MRSA Next Gen by PCR, Nasal     Status: None   Collection Time: 12/09/23  1:34 AM   Specimen: Nasal Mucosa; Nasal Swab  Result Value Ref Range Status   MRSA by PCR Next Gen NOT DETECTED NOT DETECTED Final    Comment: (NOTE) The GeneXpert MRSA Assay (FDA approved for NASAL specimens only), is one component of a comprehensive MRSA colonization surveillance program. It is not intended to diagnose MRSA infection nor to guide or monitor treatment for MRSA infections. Test performance is not FDA approved in patients less than 36 years old. Performed at [facility], [address] [place], [place] [address]   Culture, Respiratory w Gram Stain     Status: None (Preliminary result)   Collection Time: 12/09/23  3:34 PM   Specimen: Bronchoalveolar Lavage; Respiratory  Result Value Ref Range Status   Specimen Description   Final    BRONCHIAL ALVEOLAR LAVAGE Performed at [facility], [address], [place], [place] [address]    Special Requests   Final     NONE Performed at [facility], [address] [place], [place] [address]    Gram Stain   Final    NO SQUAMOUS EPITHELIAL CELLS SEEN RARE WBC PRESENT, PREDOMINANTLY PMN RARE GRAM NEGATIVE RODS RARE GRAM POSITIVE COCCI    Culture   Final    NO GROWTH < 24 HOURS Performed at [facility], [address], [place], [place] [address]    Report Status PENDING  Incomplete    IMAGING RESULTS:  I have personally reviewed the films ?rt lower lobe /mid lobe collapse consolidation  Impression/Recommendation  Unresponsive /  AMS secondary to cocaine use- resolved  Aspiration leading to acute hypoxic resp failure rt Middle lobe and lower lobe consolidation-- was intubated and mechanically ventilated in the beginning. Now extubated On zosyn  Being changed to augmentin  starting tomorrow  H/o fall with recurrent ED presentation- had hurt left shoulder before  Left thumb tender swelling- Xray hand to look for nay fracture  Pruritus with clear blisters- D.D  Insect bite Scabies Cocaine use R/o primary dermatological condition like pemphigus ( less likely) R/o syphilis  AIDS- non compliant with meds or visits to her Provider- not been in care since 2021- used to be followed at [facility] before Was on Biktarvy  at one time and was non compliant then VL now is 2 million and cd4 is 11  starting HAART is important but in her case not sure how much compliance to be expected if her social situation is not conducive and so is her substance use- I will try to call her previous provider to see whether they can help with her homelessness  Will start PCP and MAI prophylaxis    Anemia  Thrombocytopenia could be acute illness  ETOH abuse   Follow strict universal precautions when taking care of patient   ________________________________________________ Discussed with patient, requesting provider and her nurse Note:  This document was prepared using Dragon voice recognition software  and may include unintentional dictation errors.

## 2023-12-11 ENCOUNTER — Inpatient Hospital Stay: Payer: MEDICAID

## 2023-12-11 ENCOUNTER — Other Ambulatory Visit (HOSPITAL_COMMUNITY): Payer: Self-pay

## 2023-12-11 ENCOUNTER — Telehealth (HOSPITAL_COMMUNITY): Payer: Self-pay | Admitting: Pharmacy Technician

## 2023-12-11 DIAGNOSIS — B20 Human immunodeficiency virus [HIV] disease: Secondary | ICD-10-CM

## 2023-12-11 LAB — T-HELPER CELLS CD4/CD8 %
% CD 4 Pos. Lymph.: 2.4 % — ABNORMAL LOW (ref 30.8–58.5)
Absolute CD 4 Helper: 14 /uL — ABNORMAL LOW (ref 359–1519)
Basophils Absolute: 0 10*3/uL (ref 0.0–0.2)
Basos: 0 %
CD3+CD4+ Cells/CD3+CD8+ Cells Bld: 0.04 — ABNORMAL LOW (ref 0.92–3.72)
CD3+CD8+ Cells # Bld: 381 /uL (ref 109–897)
CD3+CD8+ Cells NFr Bld: 63.5 % — ABNORMAL HIGH (ref 12.0–35.5)
EOS (ABSOLUTE): 0 10*3/uL (ref 0.0–0.4)
Eos: 0 %
Hematocrit: 33.7 % — ABNORMAL LOW (ref 34.0–46.6)
Hemoglobin: 10.3 g/dL — ABNORMAL LOW (ref 11.1–15.9)
Immature Grans (Abs): 0 10*3/uL (ref 0.0–0.1)
Immature Granulocytes: 0 %
Lymphocytes Absolute: 0.6 10*3/uL — ABNORMAL LOW (ref 0.7–3.1)
Lymphs: 8 %
MCH: 26.5 pg — ABNORMAL LOW (ref 26.6–33.0)
MCHC: 30.6 g/dL — ABNORMAL LOW (ref 31.5–35.7)
MCV: 87 fL (ref 79–97)
Monocytes Absolute: 0.3 10*3/uL (ref 0.1–0.9)
Monocytes: 3 %
Neutrophils Absolute: 7.5 10*3/uL — ABNORMAL HIGH (ref 1.4–7.0)
Neutrophils: 89 %
Platelets: 125 10*3/uL — ABNORMAL LOW (ref 150–450)
RBC: 3.89 x10E6/uL (ref 3.77–5.28)
RDW: 14.5 % (ref 11.7–15.4)
WBC: 8.4 10*3/uL (ref 3.4–10.8)

## 2023-12-11 LAB — CBC
HCT: 29.6 % — ABNORMAL LOW (ref 36.0–46.0)
Hemoglobin: 9.2 g/dL — ABNORMAL LOW (ref 12.0–15.0)
MCH: 26.5 pg (ref 26.0–34.0)
MCHC: 31.1 g/dL (ref 30.0–36.0)
MCV: 85.3 fL (ref 80.0–100.0)
Platelets: 109 10*3/uL — ABNORMAL LOW (ref 150–400)
RBC: 3.47 MIL/uL — ABNORMAL LOW (ref 3.87–5.11)
RDW: 15.6 % — ABNORMAL HIGH (ref 11.5–15.5)
WBC: 4 10*3/uL (ref 4.0–10.5)
nRBC: 0 % (ref 0.0–0.2)

## 2023-12-11 LAB — HEPATITIS PANEL, ACUTE
HCV Ab: NONREACTIVE
Hep A IgM: NONREACTIVE
Hep B C IgM: NONREACTIVE
Hepatitis B Surface Ag: NONREACTIVE

## 2023-12-11 LAB — LEGIONELLA PNEUMOPHILA SEROGP 1 UR AG: L. pneumophila Serogp 1 Ur Ag: NEGATIVE

## 2023-12-11 LAB — LACTATE DEHYDROGENASE: LDH: 110 U/L (ref 98–192)

## 2023-12-11 LAB — CK: Total CK: 817 U/L — ABNORMAL HIGH (ref 38–234)

## 2023-12-11 LAB — BASIC METABOLIC PANEL
Anion gap: 6 (ref 5–15)
BUN: 16 mg/dL (ref 6–20)
CO2: 25 mmol/L (ref 22–32)
Calcium: 8.4 mg/dL — ABNORMAL LOW (ref 8.9–10.3)
Chloride: 113 mmol/L — ABNORMAL HIGH (ref 98–111)
Creatinine, Ser: 0.56 mg/dL (ref 0.44–1.00)
GFR, Estimated: >60 mL/min (ref >60)
Glucose, Bld: 84 mg/dL (ref 70–99)
Potassium: 3.7 mmol/L (ref 3.5–5.1)
Sodium: 144 mmol/L (ref 135–145)

## 2023-12-11 LAB — GLUCOSE, CAPILLARY
Glucose-Capillary: 108 mg/dL — ABNORMAL HIGH (ref 70–99)
Glucose-Capillary: 136 mg/dL — ABNORMAL HIGH (ref 70–99)
Glucose-Capillary: 185 mg/dL — ABNORMAL HIGH (ref 70–99)
Glucose-Capillary: 73 mg/dL (ref 70–99)
Glucose-Capillary: 81 mg/dL (ref 70–99)
Glucose-Capillary: 96 mg/dL (ref 70–99)

## 2023-12-11 LAB — PHOSPHORUS: Phosphorus: 2.6 mg/dL (ref 2.5–4.6)

## 2023-12-11 LAB — MAGNESIUM: Magnesium: 2 mg/dL (ref 1.7–2.4)

## 2023-12-11 MED ORDER — ADULT MULTIVITAMIN W/MINERALS CH
1.0000 | ORAL_TABLET | Freq: Every day | ORAL | Status: DC
  Administered 2023-12-12 – 2023-12-17 (×6): 1 via ORAL
  Filled 2023-12-11 (×7): qty 1

## 2023-12-11 MED ORDER — OXYCODONE HCL 5 MG PO TABS
5.0000 mg | ORAL_TABLET | ORAL | Status: DC | PRN
  Administered 2023-12-12 – 2023-12-28 (×25): 5 mg via ORAL
  Filled 2023-12-11 (×26): qty 1

## 2023-12-11 MED ORDER — DOCUSATE SODIUM 100 MG PO CAPS
100.0000 mg | ORAL_CAPSULE | Freq: Two times a day (BID) | ORAL | Status: DC | PRN
  Administered 2023-12-11: 100 mg via ORAL
  Filled 2023-12-11: qty 1

## 2023-12-11 MED ORDER — ACETAMINOPHEN 325 MG PO TABS
650.0000 mg | ORAL_TABLET | Freq: Four times a day (QID) | ORAL | Status: DC | PRN
  Administered 2023-12-11 – 2023-12-15 (×3): 650 mg via ORAL
  Filled 2023-12-11 (×3): qty 2

## 2023-12-11 MED ORDER — POLYETHYLENE GLYCOL 3350 17 G PO PACK
17.0000 g | PACK | Freq: Every day | ORAL | Status: DC | PRN
  Administered 2023-12-11: 17 g via ORAL
  Filled 2023-12-11: qty 1

## 2023-12-11 MED ORDER — ENSURE ENLIVE PO LIQD
237.0000 mL | Freq: Three times a day (TID) | ORAL | Status: DC
  Administered 2023-12-11 – 2024-01-15 (×91): 237 mL via ORAL

## 2023-12-11 MED ORDER — THIAMINE HCL 100 MG PO TABS
100.0000 mg | ORAL_TABLET | Freq: Every day | ORAL | Status: DC
  Administered 2023-12-12 – 2024-04-28 (×139): 100 mg via ORAL
  Filled 2023-12-11 (×263): qty 1

## 2023-12-11 MED ORDER — AZITHROMYCIN 250 MG PO TABS
1200.0000 mg | ORAL_TABLET | ORAL | Status: DC
  Administered 2023-12-12: 1250 mg via ORAL
  Filled 2023-12-11: qty 2
  Filled 2023-12-11 (×2): qty 5

## 2023-12-11 MED ORDER — AMOXICILLIN-POT CLAVULANATE 875-125 MG PO TABS
1.0000 | ORAL_TABLET | Freq: Two times a day (BID) | ORAL | Status: AC
Start: 2023-12-12 — End: 2023-12-13
  Administered 2023-12-12 (×2): 1 via ORAL
  Filled 2023-12-11 (×3): qty 1

## 2023-12-11 MED ORDER — SULFAMETHOXAZOLE-TRIMETHOPRIM 400-80 MG PO TABS
1.0000 | ORAL_TABLET | Freq: Every day | ORAL | Status: DC
Start: 2023-12-11 — End: 2023-12-17
  Administered 2023-12-11 – 2023-12-17 (×7): 1 via ORAL
  Filled 2023-12-11 (×8): qty 1

## 2023-12-11 MED ORDER — ENOXAPARIN SODIUM 40 MG/0.4ML IJ SOSY
40.0000 mg | PREFILLED_SYRINGE | Freq: Every day | INTRAMUSCULAR | Status: DC
  Administered 2023-12-11 – 2024-05-06 (×144): 40 mg via SUBCUTANEOUS
  Filled 2023-12-11 (×147): qty 0.4

## 2023-12-11 MED ORDER — FOLIC ACID 1 MG PO TABS
1.0000 mg | ORAL_TABLET | Freq: Every day | ORAL | Status: DC
  Administered 2023-12-12 – 2024-05-07 (×148): 1 mg via ORAL
  Filled 2023-12-11 (×148): qty 1

## 2023-12-11 NOTE — Evaluation (Signed)
 Clinical/Bedside Swallow Evaluation Patient Details  Name: Meghan Jarvis MRN: [id number] Date of Birth: 1976-12-11  Today's Date: 12/11/2023 Time: SLP Start Time (ACUTE ONLY): 1125 SLP Stop Time (ACUTE ONLY): 1137 SLP Time Calculation (min) (ACUTE ONLY): 12 min  Past Medical History:  Past Medical History:  Diagnosis Date   Asthma    Depression    HIV (human immunodeficiency virus infection) (HCC)    Past Surgical History:  Past Surgical History:  Procedure Laterality Date   ABDOMINAL HYSTERECTOMY     APPENDECTOMY     HPI:  Case of a 47 year old female patient with past medical history of alcohol use disorder, cocaine use, homelessness, uncontrolled HIV noncompliant with Biktarvy , A-fib, hypothyroidism, type 2 diabetes mellitus presenting to [facility] on 12/28 for acute hypoxic respiratory failure.  She was found down by her friend who attempted to wake her up all day however she would not and therefore called EMS.  On arrival she was found to be hypoxic to the 70s was placed on nonrebreather and transported to the emergency department.  In the ED she was noted to be obtunded very hypoxic and therefore she was intubated and mechanically ventilated. Vitals with low-grade fever hypotensive with SBP in the 90s.  Labs with a left shift.  CTA chest without PE but did show a large consolidative opacity in the right lower lobe and right middle lobe with retained mucus debris within the trachea extending into the bronchus intermedius. Pt extubated 12/10/2023.    Assessment / Plan / Recommendation  Clinical Impression  Pt is awake, alert, knows her name but asks where am I? She also stated when can I have some food? Pt observed to have front teeth only that were also in poor condition. Pt presents with adequate oropharyngeal abilities when consuming ice chips, thin liquids via straw (even when consuming multiple consecutive sips), puree and graham cracker. While  consuming a dry graham cracker took her alittle longer (d/t no posterior teeth) she was able to do so without dysphagia. Suspect pt's baseline diet consisted of softer food given decreased rotary chew. At this time, pt is appropriate for a dysphagia 3 diet with thin liquids via straw and medicine whole with thin liquids. ST services at not indicated at this time.   SLP Visit Diagnosis: Dysphagia, unspecified (R13.10)    Aspiration Risk  Mild aspiration risk    Diet Recommendation Dysphagia 3 (Mech soft);Thin liquid    Liquid Administration via: Straw Medication Administration: Whole meds with liquid Supervision: Staff to assist with self feeding;Full supervision/cueing for compensatory strategies Compensations: Minimize environmental distractions;Slow rate;Small sips/bites Postural Changes: Seated upright at 90 degrees;Remain upright for at least 30 minutes after po intake    Other  Recommendations Oral Care Recommendations: Oral care BID    Recommendations for follow up therapy are one component of a multi-disciplinary discharge planning process, led by the attending physician.  Recommendations may be updated based on patient status, additional functional criteria and insurance authorization.  Follow up Recommendations No SLP follow up      Assistance Recommended at Discharge  TBD  Functional Status Assessment Patient has not had a recent decline in their functional status  Frequency and Duration   N/A         Prognosis        Swallow Study   General Date of Onset: 12/08/23 HPI: Case of a 47 year old female patient with past medical history of alcohol use disorder, cocaine use, homelessness, uncontrolled HIV noncompliant  with Biktarvy , A-fib, hypothyroidism, type 2 diabetes mellitus presenting to [facility] on 12/28 for acute hypoxic respiratory failure.  She was found down by her friend who attempted to wake her up all day however she would not and  therefore called EMS.  On arrival she was found to be hypoxic to the 70s was placed on nonrebreather and transported to the emergency department.  In the ED she was noted to be obtunded very hypoxic and therefore she was intubated and mechanically ventilated. Vitals with low-grade fever hypotensive with SBP in the 90s.  Labs with a left shift.  CTA chest without PE but did show a large consolidative opacity in the right lower lobe and right middle lobe with retained mucus debris within the trachea extending into the bronchus intermedius. Pt extubated 12/10/2023. Type of Study: Bedside Swallow Evaluation Previous Swallow Assessment: none in chart Diet Prior to this Study: NPO Temperature Spikes Noted: No Respiratory Status: Nasal cannula History of Recent Intubation: Yes Total duration of intubation (days): 2 days Date extubated: 12/10/23 Behavior/Cognition: Alert;Cooperative;Confused;Distractible Oral Cavity Assessment: Within Functional Limits Oral Care Completed by SLP: Recent completion by staff Oral Cavity - Dentition: Poor condition;Missing dentition Vision: Functional for self-feeding Self-Feeding Abilities: Needs assist;Needs set up Patient Positioning: Upright in bed Baseline Vocal Quality: Low vocal intensity Volitional Cough: Cognitively unable to elicit Volitional Swallow: Unable to elicit    Oral/Motor/Sensory Function Overall Oral Motor/Sensory Function: Within functional limits   Ice Chips Ice chips: Within functional limits Presentation: Spoon   Thin Liquid Thin Liquid: Within functional limits Presentation: Self Fed;Straw    Nectar Thick Nectar Thick Liquid: Not tested   Honey Thick Honey Thick Liquid: Not tested   Puree Puree: Within functional limits Presentation: Spoon   Solid     Solid: Within functional limits Presentation: Self Fed     Keri Tavella B. Rubbie, M.S., CCC-SLP, Tree Surgeon Certified Brain Injury Specialist [facility]   [facility] Office [phone] Ascom [phone] Fax [phone]

## 2023-12-11 NOTE — Progress Notes (Signed)
 PHARMACY CONSULT NOTE  Pharmacy Consult for Electrolyte Monitoring and Replacement   Recent Labs: Potassium (mmol/L)  Date Value  12/11/2023 3.7  03/15/2015 4.2   Magnesium  (mg/dL)  Date Value  87/68/7975 2.0   Calcium  (mg/dL)  Date Value  87/68/7975 8.4 (L)   Calcium , Total (mg/dL)  Date Value  95/95/7983 8.5 (L)   Albumin (g/dL)  Date Value  87/71/7975 3.0 (L)  03/15/2015 3.4 (L)   Phosphorus (mg/dL)  Date Value  87/68/7975 2.6   Sodium (mmol/L)  Date Value  12/11/2023 144  03/15/2015 140   Assessment: 47 y.o w/ PMH of EtOH abuse ,Cocaine use, Homelessness, Uncontrolled HIV, on BIKTARVY , A-fib, GERD, Hypothyroidism, Diabetes, who presented to the ED with chief complaints of unresponsiveness. Pharmacy is asked to follow and replace electrolytes while in CCU  Goal of Therapy:  Electrolytes WNL  Plan:  --No electrolyte replacement indicated at this time --Re-check electrolytes tomorrow AM  Meghan Jarvis 12/11/2023 7:40 AM

## 2023-12-11 NOTE — Progress Notes (Signed)
 Nutrition Follow Up Note   DOCUMENTATION CODES:   Severe malnutrition in context of chronic illness  INTERVENTION:   Ensure Enlive po TID, each supplement provides 350 kcal and 20 grams of protein.  MVI, folic acid  and thiamine  po daily  Pt at high refeed risk; recommend monitor potassium, magnesium  and phosphorus labs daily until stable  Daily weights   NUTRITION DIAGNOSIS:   Severe Malnutrition related to chronic illness (HIV, homelessness, substance abuse) as evidenced by 35 percent weight loss in 1 year, severe fat depletion, severe muscle depletion. -ongoing   GOAL:   Patient will meet greater than or equal to 90% of their needs -not met   MONITOR:   PO intake, Supplement acceptance, Labs, Weight trends, I & O's, Skin  ASSESSMENT:   47 y/o female with h/o homelessness, HIV, substance abuse, Afib, mood disorder, hypothyroidism, type 2 diabetes mellitus and depression who is admitted with aspiration PNA.  Pt extubated yesterday. Pt initiated on a mechanical soft diet today. RD will add supplements and MVI to help pt meet her estimated needs. Pt is at high refeed risk.   Medications reviewed and include: heparin , insulin , azithromycin , 10% dextrose  @50ml /hr, zosyn   Labs reviewed: K 3.7 wnl, P 2.6 wnl, Mg 2.0 wnl Hgb 9.2(L), Hct 29.6(L) Cbgs- 96, 73, 81, 136 x 24 hrs  Diet Order:   Diet Order             DIET DYS 3 Room service appropriate? Yes; Fluid consistency: Thin  Diet effective now                  EDUCATION NEEDS:   Not appropriate for education at this time  Skin:  Skin Assessment: Reviewed RN Assessment  Last BM:  12/29- type 7  Height:   Ht Readings from Last 1 Encounters:  12/09/23 5' 7.99 (1.727 m)    Weight:   Wt Readings from Last 1 Encounters:  12/11/23 60.7 kg    Ideal Body Weight:  63.6 kg  BMI:  Body mass index is 20.35 kg/m.  Estimated Nutritional Needs:   Kcal:  1800-2100kcal/day  Protein:  90-105g/day  Fluid:   1.8-2.1L/day  Augustin Shams MS, RD, LDN If unable to be reached, please send secure chat to RD inpatient available from 8:00a-4:00p daily

## 2023-12-11 NOTE — Telephone Encounter (Signed)
 Patient Product/process Development Scientist completed.    The patient is insured through [employer].     Ran test claim for Biktarvy  50-20-25 mg and the current 30 day co-pay is $0.00.   This test claim was processed through [facility]- copay amounts may vary at other pharmacies due to pharmacy/plan contracts, or as the patient moves through the different stages of their insurance plan.     Reyes Sharps, CPHT Pharmacy Technician III Certified Patient Advocate [facility] Patient Advocate Team Direct Number: [phone]  Fax: [phone]

## 2023-12-11 NOTE — Plan of Care (Signed)
  Problem: Education: Goal: Ability to describe self-care measures that may prevent or decrease complications (Diabetes Survival Skills Education) will improve Outcome: Progressing Goal: Individualized Educational Video(s) Outcome: Progressing   Problem: Coping: Goal: Ability to adjust to condition or change in health will improve Outcome: Progressing   Problem: Fluid Volume: Goal: Ability to maintain a balanced intake and output will improve Outcome: Progressing   Problem: Health Behavior/Discharge Planning: Goal: Ability to identify and utilize available resources and services will improve Outcome: Progressing Goal: Ability to manage health-related needs will improve Outcome: Progressing   Problem: Metabolic: Goal: Ability to maintain appropriate glucose levels will improve Outcome: Progressing   Problem: Nutritional: Goal: Maintenance of adequate nutrition will improve Outcome: Progressing Goal: Progress toward achieving an optimal weight will improve Outcome: Progressing   Problem: Skin Integrity: Goal: Risk for impaired skin integrity will decrease Outcome: Progressing   Problem: Tissue Perfusion: Goal: Adequacy of tissue perfusion will improve Outcome: Progressing   Problem: Education: Goal: Knowledge of General Education information will improve Description: Including pain rating scale, medication(s)/side effects and non-pharmacologic comfort measures Outcome: Progressing   Problem: Health Behavior/Discharge Planning: Goal: Ability to manage health-related needs will improve Outcome: Progressing   Problem: Clinical Measurements: Goal: Ability to maintain clinical measurements within normal limits will improve Outcome: Progressing Goal: Will remain free from infection Outcome: Progressing Goal: Diagnostic test results will improve Outcome: Progressing Goal: Respiratory complications will improve Outcome: Progressing Goal: Cardiovascular complication will  be avoided Outcome: Progressing   Problem: Activity: Goal: Risk for activity intolerance will decrease Outcome: Progressing   Problem: Nutrition: Goal: Adequate nutrition will be maintained Outcome: Progressing   Problem: Coping: Goal: Level of anxiety will decrease Outcome: Progressing   Problem: Elimination: Goal: Will not experience complications related to bowel motility Outcome: Progressing Goal: Will not experience complications related to urinary retention Outcome: Progressing   Problem: Pain Management: Goal: General experience of comfort will improve Outcome: Progressing   Problem: Safety: Goal: Ability to remain free from injury will improve Outcome: Progressing   Problem: Skin Integrity: Goal: Risk for impaired skin integrity will decrease Outcome: Progressing   Problem: Activity: Goal: Ability to tolerate increased activity will improve Outcome: Progressing   Problem: Respiratory: Goal: Ability to maintain a clear airway and adequate ventilation will improve Outcome: Progressing
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
## 2023-12-12 DIAGNOSIS — G9341 Metabolic encephalopathy: Secondary | ICD-10-CM

## 2023-12-12 DIAGNOSIS — J9601 Acute respiratory failure with hypoxia: Secondary | ICD-10-CM

## 2023-12-12 DIAGNOSIS — B20 Human immunodeficiency virus [HIV] disease: Secondary | ICD-10-CM

## 2023-12-12 DIAGNOSIS — A419 Sepsis, unspecified organism: Secondary | ICD-10-CM

## 2023-12-12 DIAGNOSIS — R6520 Severe sepsis without septic shock: Secondary | ICD-10-CM

## 2023-12-12 DIAGNOSIS — F1410 Cocaine abuse, uncomplicated: Secondary | ICD-10-CM | POA: Diagnosis present

## 2023-12-12 DIAGNOSIS — E11649 Type 2 diabetes mellitus with hypoglycemia without coma: Secondary | ICD-10-CM | POA: Diagnosis present

## 2023-12-12 DIAGNOSIS — F1010 Alcohol abuse, uncomplicated: Secondary | ICD-10-CM

## 2023-12-12 DIAGNOSIS — J189 Pneumonia, unspecified organism: Secondary | ICD-10-CM | POA: Diagnosis not present

## 2023-12-12 LAB — CULTURE, RESPIRATORY W GRAM STAIN: Gram Stain: NONE SEEN

## 2023-12-12 LAB — CBC WITH DIFFERENTIAL/PLATELET
Abs Immature Granulocytes: 0.01 10*3/uL (ref 0.00–0.07)
Basophils Absolute: 0 10*3/uL (ref 0.0–0.1)
Basophils Relative: 0 %
Eosinophils Absolute: 0.5 10*3/uL (ref 0.0–0.5)
Eosinophils Relative: 16 %
HCT: 28.8 % — ABNORMAL LOW (ref 36.0–46.0)
Hemoglobin: 9 g/dL — ABNORMAL LOW (ref 12.0–15.0)
Immature Granulocytes: 0 %
Lymphocytes Relative: 10 %
Lymphs Abs: 0.3 10*3/uL — ABNORMAL LOW (ref 0.7–4.0)
MCH: 26.9 pg (ref 26.0–34.0)
MCHC: 31.3 g/dL (ref 30.0–36.0)
MCV: 86.2 fL (ref 80.0–100.0)
Monocytes Absolute: 0.2 10*3/uL (ref 0.1–1.0)
Monocytes Relative: 8 %
Neutro Abs: 2 10*3/uL (ref 1.7–7.7)
Neutrophils Relative %: 66 %
Platelets: 116 10*3/uL — ABNORMAL LOW (ref 150–400)
RBC: 3.34 MIL/uL — ABNORMAL LOW (ref 3.87–5.11)
RDW: 14.7 % (ref 11.5–15.5)
WBC: 3 10*3/uL — ABNORMAL LOW (ref 4.0–10.5)
nRBC: 0 % (ref 0.0–0.2)

## 2023-12-12 LAB — BASIC METABOLIC PANEL
Anion gap: 8 (ref 5–15)
BUN: 12 mg/dL (ref 6–20)
CO2: 25 mmol/L (ref 22–32)
Calcium: 8.2 mg/dL — ABNORMAL LOW (ref 8.9–10.3)
Chloride: 108 mmol/L (ref 98–111)
Creatinine, Ser: 0.62 mg/dL (ref 0.44–1.00)
GFR, Estimated: >60 mL/min (ref >60)
Glucose, Bld: 107 mg/dL — ABNORMAL HIGH (ref 70–99)
Potassium: 3.4 mmol/L — ABNORMAL LOW (ref 3.5–5.1)
Sodium: 141 mmol/L (ref 135–145)

## 2023-12-12 LAB — GLUCOSE, CAPILLARY
Glucose-Capillary: 111 mg/dL — ABNORMAL HIGH (ref 70–99)
Glucose-Capillary: 115 mg/dL — ABNORMAL HIGH (ref 70–99)
Glucose-Capillary: 127 mg/dL — ABNORMAL HIGH (ref 70–99)
Glucose-Capillary: 135 mg/dL — ABNORMAL HIGH (ref 70–99)
Glucose-Capillary: 147 mg/dL — ABNORMAL HIGH (ref 70–99)
Glucose-Capillary: 153 mg/dL — ABNORMAL HIGH (ref 70–99)
Glucose-Capillary: 86 mg/dL (ref 70–99)

## 2023-12-12 LAB — PHOSPHORUS: Phosphorus: 2.5 mg/dL (ref 2.5–4.6)

## 2023-12-12 LAB — MAGNESIUM: Magnesium: 2 mg/dL (ref 1.7–2.4)

## 2023-12-12 LAB — RPR: RPR Ser Ql: NONREACTIVE

## 2023-12-12 NOTE — Progress Notes (Signed)
 Progress Note   Patient: Meghan Jarvis FMW:[id number] DOB: 22-Jun-1976 DOA: 12/08/2023     4 DOS: the patient was seen and examined on 12/12/2023   Brief hospital course: 48 year old female past medical history of alcohol abuse, cocaine abuse, homelessness, AIDS, atrial fibrillation, hypothyroidism, type 2 diabetes mellitus presented to [facility] with acute respiratory failure.  She was found down by a friend and could not wake her up.  She was found to be hypoxic in the 70s.  Since she was obtunded she was intubated and admitted by the critical care service.  He was started on antibiotics for pneumonia.  Urine toxicology positive for cocaine.  Patient extubated on 12/30.  12/12/2023.  Patient feeling okay.  Spoke with patient's friend [name] on the phone.  The patient is dating her son.    Assessment and Plan: * Acute hypoxic respiratory failure (HCC) Patient extubated on 12/30.  Patient currently breathing on room air.  Severe sepsis (HCC) Present on admission with tachycardia, tachypnea, fever, pneumonia, acute respiratory failure and acute metabolic encephalopathy.  Community acquired pneumonia of right lower lobe of lung Currently on Zosyn   Acute metabolic encephalopathy Could be secondary to pneumonia and/or hypoxia from acute respiratory failure.  Mental status improved.  Protein-calorie malnutrition, severe Continue supplements  AIDS (HCC) Viral load 2 million, CD4 14.  Infectious disease following.  Placed on PCP prophylaxis with Bactrim  and Zithromax .  Cocaine abuse (HCC) Must stop cocaine  Alcohol abuse Continue thiamine  multivitamin and folic acid   Uncontrolled type 2 diabetes mellitus with hypoglycemia, without long-term current use of insulin  (HCC) Hemoglobin A1c 6.0.  On D5 drip currently hopefully if she eats we can get rid of the drip.        Subjective: Patient feels okay.  Offers no complaints except that she has to urinate.  Admitted with acute  respiratory failure.  Physical Exam: Vitals:   12/12/23 0800 12/12/23 0900 12/12/23 1000 12/12/23 1100  BP: 116/83 115/87 114/77   Pulse: 75 82 88 77  Resp: 18 19 (!) 23 (!) 21  Temp: 98 F (36.7 C)     TempSrc: Oral     SpO2: 100% 96% 96% 100%  Weight:      Height:       Physical Exam HENT:     Head: Normocephalic.  Eyes:     General: Lids are normal.     Conjunctiva/sclera: Conjunctivae normal.  Cardiovascular:     Rate and Rhythm: Normal rate and regular rhythm.     Heart sounds: Normal heart sounds, S1 normal and S2 normal.  Pulmonary:     Breath sounds: No decreased breath sounds, wheezing, rhonchi or rales.  Abdominal:     Palpations: Abdomen is soft.     Tenderness: There is no abdominal tenderness.  Musculoskeletal:     Right lower leg: No swelling.     Left lower leg: No swelling.  Skin:    General: Skin is warm.     Comments: Some blistering lesions.  Neurological:     Mental Status: She is alert.     Data Reviewed: Last 2 sugars 127 and 111, creatinine 0.62, potassium 3.4, hemoglobin 9.0, white blood cell count 3.0 and platelet count 116 CT scan did not show any pulmonary embolism but did show right-sided pneumonia RPR nonreactive, GC and Chlamydia negative.  Family Communication: Spoke with friend [name] on the phone.  The patient dates her son.  Disposition: Status is: Inpatient Remains inpatient appropriate because: Will transfer out  of the ICU today.  PT and OT consultations.  TOC consultation.  Patient homeless.  Planned Discharge Destination: To be determined    Time spent: 28 minutes  Author: Charlie Patterson, MD 12/12/2023 12:06 PM  For on call review [email or web address].

## 2023-12-12 NOTE — Assessment & Plan Note (Addendum)
 Resolved.  Patient extubated on 12/30.  Patient currently breathing on room air.
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
## 2023-12-12 NOTE — Assessment & Plan Note (Addendum)
 Mental status improved from admission but patient is not the best historian and does not capacity to make decisions at this time.  DSS referral by TOC.  MRI did not show any enhancing lesions but did show severe cerebral atrophy.  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

## 2023-12-12 NOTE — Hospital Course (Addendum)
 HPI/Hospital Course: 47yo with h/o polysubstance (ETOH, cocaine) abuse, afib, homelessness, AIDS, hypothyroidism, and DM who presented on 12/08/23 with acute respiratory failure.  She was found down by a friend and could not wake her up.  She was found to be hypoxic in the 70s. Admitted to ICU, intubated, started on antibiotics for pneumonia.  12/31: extubated. ID consulted.  MRI brain with advanced cerebral atrophy.  Psych evaluated - "Patient is lacking and reasoning in her medical care for example she is unable to describe the consequence that she will have if she does not take her medications or has complications with her chronic conditions" and needs assistance with housing and finances due to cognitive decline.  APS/DSS pending guardianship and will need placement.  Consultants: Psych Infectious Disease Orthopedics

## 2023-12-12 NOTE — Assessment & Plan Note (Addendum)
 Hemoglobin A1c 6.0.

## 2023-12-12 NOTE — Assessment & Plan Note (Addendum)
 Nutrition Status: Nutrition Problem: Severe Malnutrition Etiology: chronic illness (HIV, homelessness, substance abuse) Signs/Symptoms: percent weight loss, severe fat depletion, severe muscle depletion Percent weight loss: 35 %

## 2023-12-12 NOTE — Assessment & Plan Note (Signed)
 Continue thiamine multivitamin and folic acid

## 2023-12-12 NOTE — Evaluation (Signed)
 Physical Therapy Evaluation Patient Details Name: Meghan Jarvis MRN: [id number] DOB: 01-Jul-1976 Today's Date: 12/12/2023  History of Present Illness  Pt is a 48 year old female past medical history of alcohol abuse, cocaine abuse, homelessness, AIDS, atrial fibrillation, hypothyroidism, type 2 diabetes mellitus presented to [facility] with acute respiratory failure.  She was found down by a friend and could not wake her up.  She was found to be hypoxic in the 70s.  Since she was obtunded she was intubated, extubated 12/30.   Clinical Impression  Pt alert and requesting to use the bathroom. Pt oriented to self but did not answer questions well from therapists, or provide PLOF, limited ability to follow commands, decreased processing and initiation of tasks noted. Supine to sit with modA, sit <> stand from EOB modAx2 and stand pivot to commode modAx2. Pt attempted to perform pericare in standing with modAx2 assist to maintain balance, maxA to complete. Pt also needed maxAx2 to stand pivot back to bed.  Overall the patient demonstrated deficits (see PT Problem List) that impede the patient's functional abilities, safety, and mobility and would benefit from skilled PT intervention.          If plan is discharge home, recommend the following: Two people to help with walking and/or transfers;Two people to help with bathing/dressing/bathroom;Direct supervision/assist for medications management;Help with stairs or ramp for entrance;Assist for transportation;Assistance with feeding;Assistance with cooking/housework;Supervision due to cognitive status;Direct supervision/assist for financial management   Can travel by private vehicle   No    Equipment Recommendations Other (comment) (TBD)  Recommendations for Other Services       Functional Status Assessment Patient has had a recent decline in their functional status and demonstrates the ability to make significant improvements in function in  a reasonable and predictable amount of time.     Precautions / Restrictions Precautions Precautions: Fall Restrictions Weight Bearing Restrictions Per Provider Order: No      Mobility  Bed Mobility Overal bed mobility: Needs Assistance Bed Mobility: Supine to Sit, Sit to Supine     Supine to sit: Mod assist, HOB elevated, Used rails, +2 for safety/equipment Sit to supine: Min assist, +2 for safety/equipment        Transfers Overall transfer level: Needs assistance Equipment used: 2 person hand held assist Transfers: Sit to/from Stand, Bed to chair/wheelchair/BSC Sit to Stand: Mod assist, +2 physical assistance Stand pivot transfers: Max assist, Mod assist, +2 physical assistance         General transfer comment: maxA to return to EOB    Ambulation/Gait               General Gait Details: unable  Stairs            Wheelchair Mobility     Tilt Bed    Modified Rankin (Stroke Patients Only)       Balance Overall balance assessment: Needs assistance Sitting-balance support: Feet supported Sitting balance-Leahy Scale: Poor     Standing balance support: Bilateral upper extremity supported Standing balance-Leahy Scale: Poor Standing balance comment: able to attempt pericare but needed maxA to complete                             Pertinent Vitals/Pain Pain Assessment Pain Assessment: Faces Faces Pain Scale: Hurts whole lot Pain Location: side/abdomen Pain Intervention(s): Limited activity within patient's tolerance, Monitored during session, Repositioned    Home Living  Additional Comments: per chart pt is homeless, pt did not provide PLOF    Prior Function                       Extremity/Trunk Assessment   Upper Extremity Assessment Upper Extremity Assessment: Generalized weakness    Lower Extremity Assessment Lower Extremity Assessment: Generalized weakness       Communication    Communication Cueing Techniques: Verbal cues;Gestural cues;Tactile cues;Visual cues  Cognition Arousal: Alert Behavior During Therapy: Flat affect, Impulsive Overall Cognitive Status: Difficult to assess                                 General Comments: pt did not speak much to therapists, internally distracted. oriented to self, difficulty with initiation and processing        General Comments      Exercises     Assessment/Plan    PT Assessment Patient needs continued PT services  PT Problem List Decreased strength;Decreased range of motion;Decreased activity tolerance;Decreased balance;Decreased mobility;Decreased knowledge of precautions;Decreased safety awareness;Decreased knowledge of use of DME       PT Treatment Interventions DME instruction;Balance training;Gait training;Neuromuscular re-education;Stair training;Functional mobility training;Patient/family education;Therapeutic activities;Therapeutic exercise    PT Goals (Current goals can be found in the Care Plan section)  Acute Rehab PT Goals Patient Stated Goal: to feel better PT Goal Formulation: With patient Time For Goal Achievement: 12/26/23 Potential to Achieve Goals: Fair    Frequency Min 1X/week     Co-evaluation PT/OT/SLP Co-Evaluation/Treatment: Yes Reason for Co-Treatment: Necessary to address cognition/behavior during functional activity;For patient/therapist safety;To address functional/ADL transfers PT goals addressed during session: Mobility/safety with mobility;Balance OT goals addressed during session: ADL's and self-care       AM-PAC PT 6 Clicks Mobility  Outcome Measure Help needed turning from your back to your side while in a flat bed without using bedrails?: A Lot Help needed moving from lying on your back to sitting on the side of a flat bed without using bedrails?: A Lot Help needed moving to and from a bed to a chair (including a wheelchair)?: A Lot Help needed  standing up from a chair using your arms (e.g., wheelchair or bedside chair)?: A Lot Help needed to walk in hospital room?: Total Help needed climbing 3-5 steps with a railing? : Total 6 Click Score: 10    End of Session   Activity Tolerance: Patient limited by fatigue Patient left: in bed;with call bell/phone within reach;with nursing/sitter in room Nurse Communication: Mobility status PT Visit Diagnosis: Other abnormalities of gait and mobility (R26.89);Difficulty in walking, not elsewhere classified (R26.2);Muscle weakness (generalized) (M62.81)    Time: 8487-8474 PT Time Calculation (min) (ACUTE ONLY): 13 min   Charges:   PT Evaluation $PT Eval Moderate Complexity: 1 Mod   PT General Charges $$ ACUTE PT VISIT: 1 Visit       Doyal Shams PT, DPT 3:38 PM,12/12/23

## 2023-12-12 NOTE — Assessment & Plan Note (Addendum)
 Completed 5 days of antibiotic.

## 2023-12-12 NOTE — Assessment & Plan Note (Addendum)
 03-22-2024 pt remains on Biktarvy. On Mepron for PCP prophylaxis.

## 2023-12-12 NOTE — Assessment & Plan Note (Addendum)
 Present on admission with tachycardia, tachypnea, fever, pneumonia, acute respiratory failure and acute metabolic encephalopathy.  Patient completed antibiotics.
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
## 2023-12-13 ENCOUNTER — Other Ambulatory Visit: Payer: Self-pay

## 2023-12-13 DIAGNOSIS — R079 Chest pain, unspecified: Secondary | ICD-10-CM | POA: Diagnosis present

## 2023-12-13 DIAGNOSIS — J189 Pneumonia, unspecified organism: Secondary | ICD-10-CM | POA: Diagnosis not present

## 2023-12-13 DIAGNOSIS — R531 Weakness: Secondary | ICD-10-CM

## 2023-12-13 DIAGNOSIS — D61818 Other pancytopenia: Secondary | ICD-10-CM | POA: Diagnosis present

## 2023-12-13 DIAGNOSIS — R197 Diarrhea, unspecified: Secondary | ICD-10-CM | POA: Diagnosis present

## 2023-12-13 DIAGNOSIS — J9601 Acute respiratory failure with hypoxia: Secondary | ICD-10-CM | POA: Diagnosis not present

## 2023-12-13 DIAGNOSIS — B20 Human immunodeficiency virus [HIV] disease: Secondary | ICD-10-CM | POA: Diagnosis not present

## 2023-12-13 DIAGNOSIS — A419 Sepsis, unspecified organism: Secondary | ICD-10-CM | POA: Diagnosis not present

## 2023-12-13 DIAGNOSIS — R0789 Other chest pain: Secondary | ICD-10-CM

## 2023-12-13 LAB — CULTURE, BLOOD (ROUTINE X 2)
Culture: NO GROWTH
Culture: NO GROWTH

## 2023-12-13 LAB — GLUCOSE, CAPILLARY
Glucose-Capillary: 98 mg/dL (ref 70–99)
Glucose-Capillary: 93 mg/dL (ref 70–99)
Glucose-Capillary: 137 mg/dL — ABNORMAL HIGH (ref 70–99)
Glucose-Capillary: 119 mg/dL — ABNORMAL HIGH (ref 70–99)
Glucose-Capillary: 91 mg/dL (ref 70–99)

## 2023-12-13 LAB — MAGNESIUM: Magnesium: 2.2 mg/dL (ref 1.7–2.4)

## 2023-12-13 LAB — TOXOPLASMA ANTIBODIES- IGG AND  IGM
Toxoplasma Antibody- IgM: <3 [AU]/ml (ref 0.0–7.9)
Toxoplasma IgG Ratio: >400 [IU]/mL — ABNORMAL HIGH (ref 0.0–7.1)

## 2023-12-13 LAB — TROPONIN I (HIGH SENSITIVITY)
Troponin I (High Sensitivity): 5 ng/L (ref <18)
Troponin I (High Sensitivity): 3 ng/L (ref <18)

## 2023-12-13 LAB — PHOSPHORUS: Phosphorus: 2.9 mg/dL (ref 2.5–4.6)

## 2023-12-13 MED ORDER — LOPERAMIDE HCL 2 MG PO CAPS: 2.0000 mg | ORAL_CAPSULE | Freq: Three times a day (TID) | ORAL | Status: DC | PRN

## 2023-12-13 MED ORDER — PANTOPRAZOLE SODIUM 40 MG PO TBEC
40.0000 mg | DELAYED_RELEASE_TABLET | Freq: Every day | ORAL | Status: DC
  Administered 2023-12-13 – 2023-12-14 (×2): 40 mg via ORAL
  Filled 2023-12-13 (×2): qty 1

## 2023-12-13 NOTE — Assessment & Plan Note (Signed)
 Physical therapy recommending rehab

## 2023-12-13 NOTE — Assessment & Plan Note (Addendum)
Atypical in nature.  Troponin x 2 negative.  EKG did not show any ST elevations.  Repeat EKG on 1/17 did not show any ST elevations.

## 2023-12-13 NOTE — Assessment & Plan Note (Addendum)
Secondary to HIV.  Platelet platelet count up in the normal range at 285, white blood cell count up in the normal range at 6.6, hemoglobin 8.1.

## 2023-12-13 NOTE — Evaluation (Addendum)
 Occupational Therapy Evaluation Patient Details Name: Meghan Jarvis MRN: [id number] DOB: 09-15-1976 Today's Date: 12/13/2023   History of Present Illness Pt is a 48 year old female admitted with [facility] with acute hypoxic respiratory failure, severe sepsis, community acquired PNA acute metabolic encephalopathy; She was intubated, extubated 12/30    PMH significant for alcohol abuse, cocaine abuse, homelessness, AIDS, atrial fibrillation, hypothyroidism, type 2 diabetes mellitus   Clinical Impression   Pt seen for OT evaluation and cotx with PT to optimize safety with ADL/mobility. Pt oriented to self, requesting urgent need to use the bathroom. Pt is questionable/unreliable historian and limited PLOF provided. Pt requierd MOD A sup>sit EOB, MOD A +2 for STS and SPT to [facility] for sequencing, safety with decreased initiation of tasks and delayed processing requiring multimodal cues throughout. Pt unable to complete pericare in standing without MAX A while requiring +2 assist to maintain standing. Pt will benefit from skilled OT services.        If plan is discharge home, recommend the following: Two people to help with walking and/or transfers;A lot of help with bathing/dressing/bathroom;Direct supervision/assist for medications management;Supervision due to cognitive status;Direct supervision/assist for financial management;Assist for transportation;Assistance with cooking/housework;Help with stairs or ramp for entrance    Functional Status Assessment  Patient has had a recent decline in their functional status and demonstrates the ability to make significant improvements in function in a reasonable and predictable amount of time.  Equipment Recommendations  Other (comment) (defer)    Recommendations for Other Services       Precautions / Restrictions Precautions Precautions: Fall Restrictions Weight Bearing Restrictions Per Provider Order: No      Mobility Bed Mobility Overal bed  mobility: Needs Assistance Bed Mobility: Supine to Sit, Sit to Supine     Supine to sit: Mod assist, HOB elevated, Used rails, +2 for safety/equipment Sit to supine: Min assist, +2 for safety/equipment        Transfers Overall transfer level: Needs assistance Equipment used: 2 person hand held assist Transfers: Sit to/from Stand, Bed to chair/wheelchair/BSC Sit to Stand: Mod assist, +2 physical assistance Stand pivot transfers: Max assist, Mod assist, +2 physical assistance         General transfer comment: maxA to return to EOB      Balance Overall balance assessment: Needs assistance   Sitting balance-Leahy Scale: Poor     Standing balance support: Bilateral upper extremity supported Standing balance-Leahy Scale: Poor Standing balance comment: able to attempt pericare but needed maxA to complete                           ADL either performed or assessed with clinical judgement   ADL Overall ADL's : Needs assistance/impaired                                       General ADL Comments: Pt required MAX A  for standing pericare, +2 for BSC transfer, and MOD A for LB access. Multimodal cues throughout for safety.     Vision         Perception         Praxis         Pertinent Vitals/Pain Pain Assessment Pain Assessment: Faces Faces Pain Scale: Hurts whole lot Pain Location: side/abdomen Pain Descriptors / Indicators: Aching, Grimacing, Guarding Pain Intervention(s): Limited activity within patient's tolerance,  Monitored during session, Repositioned     Extremity/Trunk Assessment Upper Extremity Assessment Upper Extremity Assessment: Generalized weakness   Lower Extremity Assessment Lower Extremity Assessment: Generalized weakness       Communication Communication Communication: No apparent difficulties Cueing Techniques: Verbal cues;Gestural cues;Tactile cues;Visual cues   Cognition Arousal: Alert Behavior During  Therapy: Flat affect, Impulsive Overall Cognitive Status: Difficult to assess                                 General Comments: pt did not speak much to therapists, internally distracted. oriented to self, difficulty with initiation and processing     General Comments       Exercises     Shoulder Instructions      Home Living                                   Additional Comments: per chart pt is homeless, pt did not provide PLOF      Prior Functioning/Environment Prior Level of Function : Patient poor historian/Family not available             Mobility Comments: will need to confirm          OT Problem List: Decreased strength;Pain;Decreased cognition;Decreased safety awareness;Decreased activity tolerance;Impaired balance (sitting and/or standing);Decreased knowledge of use of DME or AE      OT Treatment/Interventions: Self-care/ADL training;Therapeutic exercise;Therapeutic activities;DME and/or AE instruction;Patient/family education;Balance training;Cognitive remediation/compensation;Energy conservation    OT Goals(Current goals can be found in the care plan section) Acute Rehab OT Goals Patient Stated Goal: get better OT Goal Formulation: With patient Time For Goal Achievement: 12/26/23 Potential to Achieve Goals: Fair ADL Goals Pt Will Perform Grooming: sitting;with supervision Pt Will Perform Lower Body Dressing: with supervision;sit to/from stand;sitting/lateral leans Pt Will Transfer to Toilet: with supervision;ambulating Pt Will Perform Toileting - Clothing Manipulation and hygiene: with supervision;sit to/from stand;sitting/lateral leans  OT Frequency: Min 1X/week    Co-evaluation PT/OT/SLP Co-Evaluation/Treatment: Yes Reason for Co-Treatment: Necessary to address cognition/behavior during functional activity;For patient/therapist safety;To address functional/ADL transfers PT goals addressed during session: Mobility/safety  with mobility;Balance OT goals addressed during session: ADL's and self-care      AM-PAC OT 6 Clicks Daily Activity     Outcome Measure Help from another person eating meals?: None Help from another person taking care of personal grooming?: A Little Help from another person toileting, which includes using toliet, bedpan, or urinal?: A Lot Help from another person bathing (including washing, rinsing, drying)?: A Lot Help from another person to put on and taking off regular upper body clothing?: A Lot Help from another person to put on and taking off regular lower body clothing?: A Lot 6 Click Score: 15   End of Session    Activity Tolerance: Patient tolerated treatment well Patient left: in bed;with call bell/phone within reach;with bed alarm set;with nursing/sitter in room  OT Visit Diagnosis: Other abnormalities of gait and mobility (R26.89);Muscle weakness (generalized) (M62.81)                Time: 1512 - 1525   Charges:    OT Evaluation $OT Eval Moderate Complexity: 1 Mod OT General Charges $$ ACUTE OT VISIT: 1 Visit  **LATE ENTRY DOCUMENTATION FOR EVALUATION COMPLETED ON 12/12/2023.  Damira Kem R., MPH, MS, OTR/L ascom [phone] 12/13/23, 1:12 PM

## 2023-12-13 NOTE — Plan of Care (Signed)
  Problem: Education: Goal: Ability to describe self-care measures that may prevent or decrease complications (Diabetes Survival Skills Education) will improve Outcome: Progressing Goal: Individualized Educational Video(s) Outcome: Progressing   Problem: Coping: Goal: Ability to adjust to condition or change in health will improve Outcome: Progressing   Problem: Fluid Volume: Goal: Ability to maintain a balanced intake and output will improve Outcome: Progressing   Problem: Health Behavior/Discharge Planning: Goal: Ability to identify and utilize available resources and services will improve Outcome: Progressing Goal: Ability to manage health-related needs will improve Outcome: Progressing   Problem: Metabolic: Goal: Ability to maintain appropriate glucose levels will improve Outcome: Progressing   Problem: Nutritional: Goal: Maintenance of adequate nutrition will improve Outcome: Progressing Goal: Progress toward achieving an optimal weight will improve Outcome: Progressing   Problem: Skin Integrity: Goal: Risk for impaired skin integrity will decrease Outcome: Progressing   Problem: Tissue Perfusion: Goal: Adequacy of tissue perfusion will improve Outcome: Progressing   Problem: Education: Goal: Knowledge of General Education information will improve Description: Including pain rating scale, medication(s)/side effects and non-pharmacologic comfort measures Outcome: Progressing   Problem: Health Behavior/Discharge Planning: Goal: Ability to manage health-related needs will improve Outcome: Progressing   Problem: Clinical Measurements: Goal: Ability to maintain clinical measurements within normal limits will improve Outcome: Progressing Goal: Will remain free from infection Outcome: Progressing Goal: Diagnostic test results will improve Outcome: Progressing Goal: Respiratory complications will improve Outcome: Progressing Goal: Cardiovascular complication will  be avoided Outcome: Progressing   Problem: Activity: Goal: Risk for activity intolerance will decrease Outcome: Progressing   Problem: Nutrition: Goal: Adequate nutrition will be maintained Outcome: Progressing   Problem: Coping: Goal: Level of anxiety will decrease Outcome: Progressing   Problem: Elimination: Goal: Will not experience complications related to bowel motility Outcome: Progressing Goal: Will not experience complications related to urinary retention Outcome: Progressing   Problem: Pain Management: Goal: General experience of comfort will improve Outcome: Progressing   Problem: Safety: Goal: Ability to remain free from injury will improve Outcome: Progressing   Problem: Skin Integrity: Goal: Risk for impaired skin integrity will decrease Outcome: Progressing   Problem: Activity: Goal: Ability to tolerate increased activity will improve Outcome: Progressing   Problem: Respiratory: Goal: Ability to maintain a clear airway and adequate ventilation will improve Outcome: Progressing

## 2023-12-13 NOTE — Progress Notes (Signed)
 Date of Admission:  12/08/2023     ID: Meghan Jarvis is a 48 y.o. female  Principal Problem:   Acute hypoxic respiratory failure (HCC) Active Problems:   Alcohol abuse   Community acquired pneumonia of right lower lobe of lung   Protein-calorie malnutrition, severe   Acute metabolic encephalopathy   Uncontrolled type 2 diabetes mellitus with hypoglycemia, without long-term current use of insulin  (HCC)   Cocaine abuse (HCC)   AIDS (HCC)   Severe sepsis (HCC)   Chest pain   Pancytopenia (HCC)   Generalized weakness   Diarrhea  ? Meghan Jarvis is a 48 y.o. with a history of AIDS, not compliant with meds or visits ,  , cocaine use was brought in by EMS after being found unresponsive on the couch  in an aquaintance place-   Subjective: Pt does not talk much Says she has body pain  Medications:   azithromycin   1,250 mg Oral Weekly   Chlorhexidine  Gluconate Cloth  6 each Topical Daily   enoxaparin  (LOVENOX ) injection  40 mg Subcutaneous QHS   feeding supplement  237 mL Oral TID BM   folic acid   1 mg Oral Daily   multivitamin with minerals  1 tablet Oral Daily   pantoprazole   40 mg Oral Daily   sulfamethoxazole -trimethoprim   1 tablet Oral Daily   thiamine   100 mg Oral Daily    Objective: Vital signs in last 24 hours: Patient Vitals for the past 24 hrs:  BP Temp Temp src Pulse Resp SpO2 Weight  12/13/23 1540 125/88 98.7 F (37.1 C) -- 85 16 100 % --  12/13/23 1100 -- -- -- 88 18 99 % --  12/13/23 0846 105/81 -- -- -- -- -- --  12/13/23 0800 -- 98.3 F (36.8 C) Oral 78 16 100 % --  12/13/23 0700 -- -- -- 80 -- 99 % --  12/13/23 0600 -- -- -- 78 18 100 % --  12/13/23 0400 (!) 125/93 -- -- -- 18 100 % --  12/13/23 0348 -- 98.5 F (36.9 C) Oral -- -- -- 59.9 kg  12/13/23 0000 112/85 -- -- -- 18 -- --  12/12/23 2330 -- 98.8 F (37.1 C) Oral 79 -- 100 % --  12/12/23 2100 108/85 -- -- 81 -- 100 % --  12/12/23 2000 113/81 -- -- 80 (!) 23 99 % --  12/12/23 1915 107/80  99 F (37.2 C) Oral 78 20 100 % --      PHYSICAL EXAM:  General: chronically ill, emaciated Lips, mucosa, and tongue normal. No Thrush Neck: Supple, symmetrical, no adenopathy, thyroid: non tender no carotid bruit and no JVD. Lungs: b/l air entry Heart: s1s2 Abdomen: Soft, non-tender,not distended. Bowel sounds normal. No masses Extremities: atraumatic, no cyanosis. No edema. No clubbing Skin: flaccid blisters scattered over the body- 3 on chest , I left thumb area Lymph: Cervical, supraclavicular normal. Neurologic:moves all extremities Lab Results    Latest Ref Rng & Units 12/12/2023    3:17 AM 12/11/2023    3:33 AM 12/10/2023    3:48 AM  CBC  WBC 4.0 - 10.5 K/uL 3.0  4.0  7.6   Hemoglobin 12.0 - 15.0 g/dL 9.0  9.2  9.3   Hematocrit 36.0 - 46.0 % 28.8  29.6  30.1   Platelets 150 - 400 K/uL 116  109  124        Latest Ref Rng & Units 12/12/2023    3:17 AM 12/11/2023  3:33 AM 12/10/2023    3:48 AM  CMP  Glucose 70 - 99 mg/dL 892  84  869   BUN 6 - 20 mg/dL 12  16  24    Creatinine 0.44 - 1.00 mg/dL 9.37  9.43  9.28   Sodium 135 - 145 mmol/L 141  144  140   Potassium 3.5 - 5.1 mmol/L 3.4  3.7  3.7   Chloride 98 - 111 mmol/L 108  113  109   CO2 22 - 32 mmol/L 25  25  22    Calcium  8.9 - 10.3 mg/dL 8.2  8.4  8.0       Microbiology:  Studies/Results: DG Hand 2 View Left Result Date: 12/11/2023 CLINICAL DATA:  Thumb swelling. EXAM: LEFT HAND - 2 VIEW COMPARISON:  Wrist radiograph 1 hour prior FINDINGS: Rounded soft tissue density adjacent to the thumb metacarpal spans 18 mm. No radiopaque foreign body, soft tissue calcification or soft tissue gas. There is no subjacent erosive change or bony abnormality. Slight radial subluxation of the thumb at the carpal metacarpal joint has a chronic appearance. No fracture. IMPRESSION: 1. Rounded soft tissue density adjacent to the thumb metacarpal spans 18 mm. This may represent blister or potential ganglion. 2. No acute osseous  abnormality. Electronically Signed   By: Andrea Gasman M.D.   On: 12/11/2023 22:12     Assessment/Plan: Unresponsive /AMS secondary to cocaine use- resolved   Aspiration leading to acute hypoxic resp failure rt Middle lobe and lower lobe consolidation-- was intubated and mechanically ventilated in the beginning. Now extubated On zosyn  Being changed to augmentin  starting tomorrow   H/o fall with recurrent ED presentation- had hurt left shoulder before   Left thumb tender swelling- Xray hand no obvious fracture   Pruritus with clear blisters- D.D  Cocaine use Insect bite Scabies- no burrows R/o primary dermatological condition like pemphigus ( less likely)  Syphilis- RPR neg   AIDS- non compliant with meds or visits to her Provider- not been in care since 2021- used to be followed at [facility] before Was on Biktarvy  at one time and was non compliant then VL now is 2 million and cd4 is 11  starting HAART is important but in her case not sure how much compliance to be expected if her social situation is not conducive and so is her substance use- I tried to call her previous provider .[facility]un[facility] see whether they can help with her homelessness. If she is going to rehab we can start HAART- Biktarvy    On  PCP and MAI prophylaxis       Anemia   Thrombocytopenia could be acute illness Sent CMV DNA, AFB blood culture   ETOH abuse     Follow strict universal precautions when taking care of patient

## 2023-12-13 NOTE — Progress Notes (Signed)
 Spoke with [employer] member services. Attempting to figure out if patient has care manager and who that is. Message left for return call.

## 2023-12-13 NOTE — Progress Notes (Signed)
 Occupational Therapy Treatment Patient Details Name: Meghan Jarvis MRN: [id number] DOB: Sep 03, 1976 Today's Date: 12/13/2023   History of present illness Pt is a 48 year old female admitted with [facility] with acute hypoxic respiratory failure, severe sepsis, community acquired PNA acute metabolic encephalopathy; She was intubated, extubated 12/30    PMH significant for alcohol abuse, cocaine abuse, homelessness, AIDS, atrial fibrillation, hypothyroidism, type 2 diabetes mellitus   OT comments  Chart reviewed, pt in room with uncontained BM. MAX A +2 required for rolling, TOTAL A +2 required for peri care with pt presenting with poor awareness of situation, attempting to reach to peri are near BM. When pt cleaned up enough to attempted supine>sit for transfer to bsc, pt c/o chest pain with provider in room to address. Further oob mobility deferred. Pt follows one step directions inconsistently, improved with multi modal cues. Pt will continue to benefit from skilled OT to address deficits and to facilitate hospital ADL performance.       If plan is discharge home, recommend the following:  Two people to help with walking and/or transfers;A lot of help with bathing/dressing/bathroom;Direct supervision/assist for medications management;Supervision due to cognitive status;Direct supervision/assist for financial management;Assist for transportation;Assistance with cooking/housework;Help with stairs or ramp for entrance   Equipment Recommendations  Other (comment) (defer to next venue of care)    Recommendations for Other Services      Precautions / Restrictions Precautions Precautions: Fall Restrictions Weight Bearing Restrictions Per Provider Order: No       Mobility Bed Mobility Overal bed mobility: Needs Assistance Bed Mobility: Rolling Rolling: Max assist, +2 for physical assistance, +2 for safety/equipment (step by step multi modal cues)              Transfers                          Balance     Sitting balance-Leahy Scale: Poor Sitting balance - Comments: Poor with attempted long sit                                   ADL either performed or assessed with clinical judgement   ADL Overall ADL's : Needs assistance/impaired         Upper Body Bathing: Maximal assistance;Bed level   Lower Body Bathing: Minimal assistance;Bed level;+2 for physical assistance;+2 for safety/equipment Lower Body Bathing Details (indicate cue type and reason): after incontinent of BM Upper Body Dressing : Maximal assistance Upper Body Dressing Details (indicate cue type and reason): doff/donn gown Lower Body Dressing: Maximal assistance Lower Body Dressing Details (indicate cue type and reason): socks     Toileting- Clothing Manipulation and Hygiene: Total assistance;+2 for physical assistance;+2 for safety/equipment;Bed level Toileting - Clothing Manipulation Details (indicate cue type and reason): uncontrolled BM       General ADL Comments: oob activity declined as pt was reporting chest pain when attempted, provider in room to assess    Extremity/Trunk Assessment Upper Extremity Assessment Upper Extremity Assessment: Generalized weakness;Difficult to assess due to impaired cognition   Lower Extremity Assessment Lower Extremity Assessment: Generalized weakness;Difficult to assess due to impaired cognition   Cervical / Trunk Assessment Cervical / Trunk Assessment: Normal    Vision   Additional Comments: will continue to assess   Perception     Praxis      Cognition Arousal: Alert Behavior During Therapy: Flat affect, Impulsive  Overall Cognitive Status: Impaired/Different from baseline Area of Impairment: Orientation, Attention, Memory, Following commands, Safety/judgement, Awareness, Problem solving                 Orientation Level: Disoriented to, Situation Current Attention Level: Focused Memory: Decreased short-term  memory, Decreased recall of precautions Following Commands: Follows one step commands inconsistently Safety/Judgement: Decreased awareness of safety, Decreased awareness of deficits Awareness: Intellectual Problem Solving: Slow processing, Decreased initiation, Difficulty sequencing, Requires verbal cues, Requires tactile cues General Comments: pt attempting to reach to peri are where she had uncontrolled BM        Exercises Other Exercises Other Exercises: edu re: role of OT, role of rehab    Shoulder Instructions       General Comments pt with blisters/open areas that are covered in mepliex pads on her L chest area    Pertinent Vitals/ Pain       Pain Assessment Pain Assessment: Faces Faces Pain Scale: Hurts little more Pain Location: chest pain, unable to provide number, provider in room to address Pain Intervention(s): Monitored during session, Limited activity within patient's tolerance  Home Living                                   Additional Comments: per chart pt is homeless, pt did not provide PLOF      Prior Functioning/Environment              Frequency  Min 1X/week        Progress Toward Goals  OT Goals(current goals can now be found in the care plan section)     Acute Rehab OT Goals Patient Stated Goal: get better OT Goal Formulation: With patient Time For Goal Achievement: 12/26/23 Potential to Achieve Goals: Fair ADL Goals Pt Will Perform Grooming: sitting;with supervision Pt Will Perform Lower Body Dressing: with supervision;sit to/from stand;sitting/lateral leans Pt Will Transfer to Toilet: with supervision;ambulating Pt Will Perform Toileting - Clothing Manipulation and hygiene: with supervision;sit to/from stand;sitting/lateral leans  Plan      Co-evaluation    PT/OT/SLP Co-Evaluation/Treatment: Yes Reason for Co-Treatment: Necessary to address cognition/behavior during functional activity;For patient/therapist  safety;To address functional/ADL transfers PT goals addressed during session: Mobility/safety with mobility;Balance OT goals addressed during session: ADL's and self-care      AM-PAC OT 6 Clicks Daily Activity     Outcome Measure   Help from another person eating meals?: None Help from another person taking care of personal grooming?: A Lot Help from another person toileting, which includes using toliet, bedpan, or urinal?: Total Help from another person bathing (including washing, rinsing, drying)?: A Lot Help from another person to put on and taking off regular upper body clothing?: A Lot Help from another person to put on and taking off regular lower body clothing?: A Lot 6 Click Score: 13    End of Session    OT Visit Diagnosis: Other abnormalities of gait and mobility (R26.89);Muscle weakness (generalized) (M62.81)   Activity Tolerance Patient tolerated treatment well   Patient Left in bed;with call bell/phone within reach;with bed alarm set;with nursing/sitter in room   Nurse Communication Mobility status        Time: 8968-8891 OT Time Calculation (min): 37 min  Charges: OT General Charges $OT Visit: 1 Visit OT Treatments $Self Care/Home Management : 8-22 mins Therisa Sheffield, OTD OTR/L  12/13/23, 2:23 PM

## 2023-12-13 NOTE — Progress Notes (Signed)
 Progress Note   Patient: Meghan Jarvis FMW:[id number] DOB: 1976-02-26 DOA: 12/08/2023     5 DOS: the patient was seen and examined on 12/13/2023   Brief hospital course: 48 year old female past medical history of alcohol abuse, cocaine abuse, homelessness, AIDS, atrial fibrillation, hypothyroidism, type 2 diabetes mellitus presented to [facility] with acute respiratory failure.  She was found down by a friend and could not wake her up.  She was found to be hypoxic in the 70s.  Since she was obtunded she was intubated and admitted by the critical care service.  He was started on antibiotics for pneumonia.  Urine toxicology positive for cocaine.  Patient extubated on 12/30.  12/12/2023.  Patient feeling okay.  Spoke with patient's friend [name] on the phone.  The patient is dating her son.    Assessment and Plan: * Acute hypoxic respiratory failure (HCC) Patient extubated on 12/30.  Patient currently breathing on room air.  Severe sepsis (HCC) Present on admission with tachycardia, tachypnea, fever, pneumonia, acute respiratory failure and acute metabolic encephalopathy.  Community acquired pneumonia of right lower lobe of lung Currently completed 5 days of antibiotic.  Acute metabolic encephalopathy Could be secondary to pneumonia and/or hypoxia from acute respiratory failure.  Mental status improved.  Protein-calorie malnutrition, severe Continue supplements  AIDS (HCC) Viral load 2 million, CD4 14.  Infectious disease following.  Placed on PCP and MAI prophylaxis with Bactrim  and Zithromax .  Cocaine abuse (HCC) Must stop cocaine  Alcohol abuse Continue thiamine  multivitamin and folic acid   Uncontrolled type 2 diabetes mellitus with hypoglycemia, without long-term current use of insulin  (HCC) Hemoglobin A1c 6.0.  Discontinued D5 drip and monitor sugars.  Diarrhea Antibiotics to complete today.  As needed Imodium .  Generalized weakness Physical therapy recommending  rehab  Pancytopenia (HCC) Secondary to HIV.  Chest pain Atypical in nature.  Troponin x 2 negative.  EKG did not show any ST elevations.        Subjective: Patient complained of chest pain while is in the room.  Also had some diarrhea.  Antibiotics completed today.  Admitted with acute respiratory failure and severe sepsis.  Physical Exam: Vitals:   12/13/23 0800 12/13/23 0846 12/13/23 1100 12/13/23 1540  BP:  105/81  125/88  Pulse: 78  88 85  Resp: 16  18 16   Temp: 98.3 F (36.8 C)   98.7 F (37.1 C)  TempSrc: Oral     SpO2: 100%  99% 100%  Weight:      Height:       Physical Exam HENT:     Head: Normocephalic.  Eyes:     General: Lids are normal.     Conjunctiva/sclera: Conjunctivae normal.  Cardiovascular:     Rate and Rhythm: Normal rate and regular rhythm.     Heart sounds: Normal heart sounds, S1 normal and S2 normal.  Pulmonary:     Breath sounds: No decreased breath sounds, wheezing, rhonchi or rales.  Abdominal:     Palpations: Abdomen is soft.     Tenderness: There is no abdominal tenderness.  Musculoskeletal:     Right lower leg: No swelling.     Left lower leg: No swelling.  Skin:    General: Skin is warm.     Comments: Chest wall blisters popped.  Neurological:     Mental Status: She is alert.     Data Reviewed: Troponin x 2 negative  Family Communication: Spoke with friend on the phone  Disposition: Status is: Inpatient Remains  inpatient appropriate because: PT rec rehab  Planned Discharge Destination: Rehab    Time spent: 28 minutes  Author: Charlie Patterson, MD 12/13/2023 5:40 PM  For on call review [email or web address]

## 2023-12-13 NOTE — Assessment & Plan Note (Addendum)
 Likely overflow constipation causing her diarrhea.  Patient did not like lactulose or MiraLAX.  Will start Colace.

## 2023-12-14 ENCOUNTER — Encounter: Payer: Self-pay | Admitting: Radiology

## 2023-12-14 ENCOUNTER — Inpatient Hospital Stay: Payer: MEDICAID

## 2023-12-14 DIAGNOSIS — R1011 Right upper quadrant pain: Secondary | ICD-10-CM | POA: Diagnosis not present

## 2023-12-14 DIAGNOSIS — B20 Human immunodeficiency virus [HIV] disease: Secondary | ICD-10-CM | POA: Diagnosis not present

## 2023-12-14 DIAGNOSIS — J189 Pneumonia, unspecified organism: Secondary | ICD-10-CM | POA: Diagnosis not present

## 2023-12-14 DIAGNOSIS — J9601 Acute respiratory failure with hypoxia: Secondary | ICD-10-CM | POA: Diagnosis not present

## 2023-12-14 LAB — CMV DNA, QUANTITATIVE, PCR
CMV DNA Quant: NEGATIVE [IU]/mL
Log10 CMV Qn DNA Pl: UNDETERMINED {Log_IU}/mL

## 2023-12-14 LAB — CBC
HCT: 30.1 % — ABNORMAL LOW (ref 36.0–46.0)
Hemoglobin: 9.3 g/dL — ABNORMAL LOW (ref 12.0–15.0)
MCH: 26.9 pg (ref 26.0–34.0)
MCHC: 30.9 g/dL (ref 30.0–36.0)
MCV: 87 fL (ref 80.0–100.0)
Platelets: 165 10*3/uL (ref 150–400)
RBC: 3.46 MIL/uL — ABNORMAL LOW (ref 3.87–5.11)
RDW: 14.6 % (ref 11.5–15.5)
WBC: 3.2 10*3/uL — ABNORMAL LOW (ref 4.0–10.5)
nRBC: 0 % (ref 0.0–0.2)

## 2023-12-14 LAB — BASIC METABOLIC PANEL
Anion gap: 7 (ref 5–15)
BUN: 13 mg/dL (ref 6–20)
CO2: 25 mmol/L (ref 22–32)
Calcium: 8.5 mg/dL — ABNORMAL LOW (ref 8.9–10.3)
Chloride: 108 mmol/L (ref 98–111)
Creatinine, Ser: 0.77 mg/dL (ref 0.44–1.00)
GFR, Estimated: >60 mL/min (ref >60)
Glucose, Bld: 106 mg/dL — ABNORMAL HIGH (ref 70–99)
Potassium: 3.5 mmol/L (ref 3.5–5.1)
Sodium: 140 mmol/L (ref 135–145)

## 2023-12-14 LAB — CRYPTOCOCCUS ANTIGEN, SERUM: Cryptococcus Antigen, Serum: NEGATIVE

## 2023-12-14 LAB — GLUCOSE, CAPILLARY
Glucose-Capillary: 113 mg/dL — ABNORMAL HIGH (ref 70–99)
Glucose-Capillary: 92 mg/dL (ref 70–99)
Glucose-Capillary: 128 mg/dL — ABNORMAL HIGH (ref 70–99)
Glucose-Capillary: 85 mg/dL (ref 70–99)
Glucose-Capillary: 111 mg/dL — ABNORMAL HIGH (ref 70–99)

## 2023-12-14 MED ORDER — BICTEGRAVIR-EMTRICITAB-TENOFOV 50-200-25 MG PO TABS
1.0000 | ORAL_TABLET | Freq: Every day | ORAL | Status: DC
  Administered 2023-12-14 – 2024-05-05 (×144): 1 via ORAL
  Filled 2023-12-14 (×147): qty 1

## 2023-12-14 MED ORDER — BUTALBITAL-APAP-CAFFEINE 50-325-40 MG PO TABS
1.0000 | ORAL_TABLET | Freq: Once | ORAL | Status: AC
  Administered 2023-12-14: 1 via ORAL
  Filled 2023-12-14: qty 1

## 2023-12-14 MED ORDER — PANTOPRAZOLE SODIUM 40 MG PO TBEC
40.0000 mg | DELAYED_RELEASE_TABLET | Freq: Two times a day (BID) | ORAL | Status: DC
  Administered 2023-12-14 – 2024-05-07 (×288): 40 mg via ORAL
  Filled 2023-12-14 (×289): qty 1

## 2023-12-14 MED ORDER — LORAZEPAM 2 MG/ML IJ SOLN
0.5000 mg | Freq: Once | INTRAMUSCULAR | Status: AC | PRN
  Administered 2023-12-14: 0.5 mg via INTRAVENOUS
  Filled 2023-12-14: qty 1

## 2023-12-14 MED ORDER — GADOBUTROL 1 MMOL/ML IV SOLN
6.0000 mL | Freq: Once | INTRAVENOUS | Status: AC | PRN
  Administered 2023-12-14: 6 mL via INTRAVENOUS

## 2023-12-14 NOTE — Progress Notes (Signed)
Patient sent to ultrasound via bed in stable condition

## 2023-12-14 NOTE — Progress Notes (Signed)
 Date of Admission:  12/08/2023     ID: Meghan Jarvis is a 48 y.o. female  Principal Problem:   Acute hypoxic respiratory failure (HCC) Active Problems:   Alcohol abuse   Pneumonia of right lower lobe due to infectious organism   Protein-calorie malnutrition, severe   Acute metabolic encephalopathy   Uncontrolled type 2 diabetes mellitus with hypoglycemia, without long-term current use of insulin  (HCC)   Cocaine abuse (HCC)   AIDS (HCC)   Severe sepsis (HCC)   Chest pain   Pancytopenia (HCC)   Generalized weakness   Diarrhea  ? Meghan Jarvis is a 48 y.o. with a history of AIDS, not compliant with meds or visits ,  , cocaine use was brought in by EMS after being found unresponsive on the couch  in an aquaintance place-   Subjective: Pt is in imaging  Medications:   azithromycin   1,250 mg Oral Weekly   bictegravir-emtricitabine -tenofovir  AF  1 tablet Oral Daily   Chlorhexidine  Gluconate Cloth  6 each Topical Daily   enoxaparin  (LOVENOX ) injection  40 mg Subcutaneous QHS   feeding supplement  237 mL Oral TID BM   folic acid   1 mg Oral Daily   multivitamin with minerals  1 tablet Oral Daily   pantoprazole   40 mg Oral BID   sulfamethoxazole -trimethoprim   1 tablet Oral Daily   thiamine   100 mg Oral Daily    Objective: Vital signs in last 24 hours: Patient Vitals for the past 24 hrs:  BP Temp Temp src Pulse Resp SpO2 Weight  12/14/23 1203 112/74 98.2 F (36.8 C) -- 88 15 99 % --  12/14/23 0738 111/78 98.9 F (37.2 C) -- 91 15 98 % --  12/14/23 0500 -- -- -- -- -- -- 55.1 kg  12/14/23 0308 111/81 98.6 F (37 C) Oral 91 18 100 % --  12/13/23 2318 105/73 99.2 F (37.3 C) Oral 84 18 100 % --  12/13/23 2007 110/82 100.2 F (37.9 C) -- 88 18 99 % --  12/13/23 1540 125/88 98.7 F (37.1 C) -- 85 16 100 % --      Lab Results    Latest Ref Rng & Units 12/14/2023    4:08 AM 12/12/2023    3:17 AM 12/11/2023    3:33 AM  CBC  WBC 4.0 - 10.5 K/uL 3.2  3.0  4.0    Hemoglobin 12.0 - 15.0 g/dL 9.3  9.0  9.2   Hematocrit 36.0 - 46.0 % 30.1  28.8  29.6   Platelets 150 - 400 K/uL 165  116  109        Latest Ref Rng & Units 12/14/2023    4:08 AM 12/12/2023    3:17 AM 12/11/2023    3:33 AM  CMP  Glucose 70 - 99 mg/dL 893  892  84   BUN 6 - 20 mg/dL 13  12  16    Creatinine 0.44 - 1.00 mg/dL 9.22  9.37  9.43   Sodium 135 - 145 mmol/L 140  141  144   Potassium 3.5 - 5.1 mmol/L 3.5  3.4  3.7   Chloride 98 - 111 mmol/L 108  108  113   CO2 22 - 32 mmol/L 25  25  25    Calcium  8.9 - 10.3 mg/dL 8.5  8.2  8.4       Microbiology: 12/28 BC NG  Toxo IgG > 400 igM neg RPR NR CMV DNA neg Crypto neg HIV RNA 2  million Cd4 is 14 ( 2.4%)     Assessment/Plan: Unresponsive /AMS secondary to cocaine use- resolved   Aspiration leading to acute hypoxic resp failure rt Middle lobe and lower lobe consolidation-- was intubated and mechanically ventilated in the beginning. Now extubated Was On zosyn  Completed rx   H/o fall with recurrent ED presentation- had hurt left shoulder before   Left thumb tender swelling- Xray hand no obvious fracture   Pruritus with clear blisters- D.D  Cocaine use Insect bite Scabies- no burrows R/o primary dermatological condition like pemphigus ( less likely)  Syphilis- RPR neg   AIDS- non compliant with meds or visits to her Provider- not been in care since 2021- used to be followed at [facility] before Was on Biktarvy  at one time and was non compliant then VL now is 2 million and cd4 is 11  starting HAART is important but in her case not sure how much compliance to be expected if her social situation is not conducive and so is her substance use- I tried to call her previous provider .pin[facility]un[facility]ort[facility] see whether they can help with her homelessness.   start HAART- Biktarvy   Toxo IgG very high- will get MRI brain to r/o CNS lesions   On  PCP and MAI prophylaxis       Anemia  leucopenia/thrombocytopenia could be due to AIDS  ETOH abuse can cuase thrombocytopenia CMV DNA neg , AFB blood culture sent    ETOH abuse     Follow strict universal precautions when taking care of patient   Discussed the management with hospitalist ID will not see her this weekend- On call IDP available by phone for urgent issues

## 2023-12-14 NOTE — Progress Notes (Signed)
Patient received from ultrasound via bed in stable condition. 

## 2023-12-14 NOTE — Plan of Care (Signed)
  Problem: Education: Goal: Ability to describe self-care measures that may prevent or decrease complications (Diabetes Survival Skills Education) will improve Outcome: Progressing Goal: Individualized Educational Video(s) Outcome: Progressing   Problem: Coping: Goal: Ability to adjust to condition or change in health will improve Outcome: Progressing   Problem: Fluid Volume: Goal: Ability to maintain a balanced intake and output will improve Outcome: Progressing   Problem: Health Behavior/Discharge Planning: Goal: Ability to identify and utilize available resources and services will improve Outcome: Progressing Goal: Ability to manage health-related needs will improve Outcome: Progressing   Problem: Metabolic: Goal: Ability to maintain appropriate glucose levels will improve Outcome: Progressing   Problem: Nutritional: Goal: Maintenance of adequate nutrition will improve Outcome: Progressing Goal: Progress toward achieving an optimal weight will improve Outcome: Progressing   Problem: Skin Integrity: Goal: Risk for impaired skin integrity will decrease Outcome: Progressing   Problem: Tissue Perfusion: Goal: Adequacy of tissue perfusion will improve Outcome: Progressing   Problem: Education: Goal: Knowledge of General Education information will improve Description: Including pain rating scale, medication(s)/side effects and non-pharmacologic comfort measures Outcome: Progressing   Problem: Health Behavior/Discharge Planning: Goal: Ability to manage health-related needs will improve Outcome: Progressing   Problem: Clinical Measurements: Goal: Ability to maintain clinical measurements within normal limits will improve Outcome: Progressing Goal: Will remain free from infection Outcome: Progressing Goal: Diagnostic test results will improve Outcome: Progressing Goal: Respiratory complications will improve Outcome: Progressing Goal: Cardiovascular complication will  be avoided Outcome: Progressing   Problem: Activity: Goal: Risk for activity intolerance will decrease Outcome: Progressing   Problem: Nutrition: Goal: Adequate nutrition will be maintained Outcome: Progressing   Problem: Coping: Goal: Level of anxiety will decrease Outcome: Progressing   Problem: Elimination: Goal: Will not experience complications related to bowel motility Outcome: Progressing Goal: Will not experience complications related to urinary retention Outcome: Progressing   Problem: Pain Management: Goal: General experience of comfort will improve Outcome: Progressing   Problem: Safety: Goal: Ability to remain free from injury will improve Outcome: Progressing   Problem: Skin Integrity: Goal: Risk for impaired skin integrity will decrease Outcome: Progressing   Problem: Activity: Goal: Ability to tolerate increased activity will improve Outcome: Progressing   Problem: Respiratory: Goal: Ability to maintain a clear airway and adequate ventilation will improve Outcome: Progressing

## 2023-12-14 NOTE — Plan of Care (Signed)
  Problem: Education: Goal: Ability to describe self-care measures that may prevent or decrease complications (Diabetes Survival Skills Education) will improve Outcome: Progressing   Problem: Coping: Goal: Ability to adjust to condition or change in health will improve Outcome: Progressing   Problem: Fluid Volume: Goal: Ability to maintain a balanced intake and output will improve Outcome: Progressing   Problem: Metabolic: Goal: Ability to maintain appropriate glucose levels will improve Outcome: Progressing   Problem: Nutritional: Goal: Maintenance of adequate nutrition will improve Outcome: Progressing   Problem: Skin Integrity: Goal: Risk for impaired skin integrity will decrease Outcome: Progressing   Problem: Tissue Perfusion: Goal: Adequacy of tissue perfusion will improve Outcome: Progressing   Problem: Education: Goal: Knowledge of General Education information will improve Description: Including pain rating scale, medication(s)/side effects and non-pharmacologic comfort measures Outcome: Progressing   Problem: Activity: Goal: Risk for activity intolerance will decrease Outcome: Progressing   Problem: Nutrition: Goal: Adequate nutrition will be maintained Outcome: Progressing   Problem: Coping: Goal: Level of anxiety will decrease Outcome: Progressing   Problem: Pain Management: Goal: General experience of comfort will improve Outcome: Progressing   Problem: Safety: Goal: Ability to remain free from injury will improve Outcome: Progressing   Problem: Skin Integrity: Goal: Risk for impaired skin integrity will decrease Outcome: Progressing   Problem: Activity: Goal: Ability to tolerate increased activity will improve Outcome: Progressing   Problem: Respiratory: Goal: Ability to maintain a clear airway and adequate ventilation will improve Outcome: Progressing

## 2023-12-14 NOTE — Progress Notes (Signed)
 Progress Note   Patient: Meghan Jarvis FMW:[id number] DOB: 11/10/1976 DOA: 12/08/2023     6 DOS: the patient was seen and examined on 12/14/2023   Brief hospital course: 48 year old female past medical history of alcohol abuse, cocaine abuse, homelessness, AIDS, atrial fibrillation, hypothyroidism, type 2 diabetes mellitus presented to [facility] with acute respiratory failure.  She was found down by a friend and could not wake her up.  She was found to be hypoxic in the 70s.  Since she was obtunded she was intubated and admitted by the critical care service.  He was started on antibiotics for pneumonia.  Urine toxicology positive for cocaine.  Patient extubated on 12/30.  12/12/2023.  Patient feeling okay.  Spoke with patient's friend [name] on the phone.  The patient is dating her son. 1/2.  Patient complained of chest pain.  Cardiac enzymes negative x 2 and EKG did not show any ST elevation. 1/3.  Patient complaining of abdominal pain.  Will get right upper quadrant ultrasound.  Toxoplasma IgG antibody came back elevated and ID recommended an MRI of the brain which I will order.   Assessment and Plan: * Right upper quadrant abdominal pain Will get ultrasound of the abdomen.  Will give empiric Protonix .  Acute hypoxic respiratory failure [facility]) Patient extubated on 12/30.  Patient currently breathing on room air.  Severe sepsis (HCC) Present on admission with tachycardia, tachypnea, fever, pneumonia, acute respiratory failure and acute metabolic encephalopathy.  Patient completed antibiotics.  Pneumonia of right lower lobe due to infectious organism Completed 5 days of antibiotic.  Acute metabolic encephalopathy Could be secondary to pneumonia and/or hypoxia from acute respiratory failure.  Mental status improved.  With Toxoplasma antibody IgG being elevated we will get an MRI of the brain with contrast to rule out brain lesions.  Protein-calorie malnutrition, severe Continue  supplements  AIDS (HCC) Viral load 2 million, CD4 14.  Infectious disease following.  Placed on PCP and MAI prophylaxis with Bactrim  and Zithromax .  Toxoplasma antibody IgG came back elevated will get MRI of the brain with contrast.  Cocaine abuse (HCC) Must stop cocaine  Alcohol abuse Continue thiamine  multivitamin and folic acid   Uncontrolled type 2 diabetes mellitus with hypoglycemia, without long-term current use of insulin  (HCC) Hemoglobin A1c 6.0.  Discontinued D5 drip and follow sugars.  Diarrhea  As needed Imodium .  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  Platelet platelet count up in the normal range at 165, hemoglobin 9.3 and white blood cell count 3.2.  Chest pain Atypical in nature.  Troponin x 2 negative.  EKG did not show any ST elevations.        Subjective: Patient complaining of abdominal pain.  No nausea or vomiting.  No diarrhea.  Physical Exam: Vitals:   12/14/23 0308 12/14/23 0500 12/14/23 0738 12/14/23 1203  BP: 111/81  111/78 112/74  Pulse: 91  91 88  Resp: 18  15 15   Temp: 98.6 F (37 C)  98.9 F (37.2 C) 98.2 F (36.8 C)  TempSrc: Oral     SpO2: 100%  98% 99%  Weight:  55.1 kg    Height:       Physical Exam HENT:     Head: Normocephalic.  Eyes:     General: Lids are normal.     Conjunctiva/sclera: Conjunctivae normal.  Cardiovascular:     Rate and Rhythm: Normal rate and regular rhythm.     Heart sounds: Normal heart sounds, S1  normal and S2 normal.  Pulmonary:     Breath sounds: No decreased breath sounds, wheezing, rhonchi or rales.  Abdominal:     Palpations: Abdomen is soft.     Tenderness: There is no abdominal tenderness.  Musculoskeletal:     Right lower leg: No swelling.     Left lower leg: No swelling.     Comments: Left thumb swelling.  Skin:    General: Skin is warm.     Comments: Chest wall blisters popped.  Blister left thumb.  Neurological:     Mental  Status: She is alert.     Data Reviewed: Creatinine 0.77, potassium 3.5, white blood count 3.2, hemoglobin 9.3, platelet count 165  Family Communication: Left message for friend  Disposition: Status is: Inpatient Remains inpatient appropriate because: mri brain with elevated toxo antibody, right upper quadrant ultrasound with right upper quadrant pain  Planned Discharge Destination: Rehab    Time spent: 28 minutes  Author: Charlie Patterson, MD 12/14/2023 3:12 PM  For on call review [email or web address]

## 2023-12-14 NOTE — Assessment & Plan Note (Deleted)
 Ultrasound of the right upper quadrant negative for cholecystitis. Empiric Protonix.

## 2023-12-15 DIAGNOSIS — R636 Underweight: Secondary | ICD-10-CM

## 2023-12-15 DIAGNOSIS — B20 Human immunodeficiency virus [HIV] disease: Secondary | ICD-10-CM | POA: Diagnosis not present

## 2023-12-15 DIAGNOSIS — J189 Pneumonia, unspecified organism: Secondary | ICD-10-CM | POA: Diagnosis not present

## 2023-12-15 DIAGNOSIS — J9601 Acute respiratory failure with hypoxia: Secondary | ICD-10-CM | POA: Diagnosis not present

## 2023-12-15 DIAGNOSIS — Z681 Body mass index (BMI) 19 or less, adult: Secondary | ICD-10-CM

## 2023-12-15 DIAGNOSIS — R519 Headache, unspecified: Secondary | ICD-10-CM

## 2023-12-15 MED ORDER — MAGNESIUM SULFATE 2 GM/50ML IV SOLN
2.0000 g | Freq: Once | INTRAVENOUS | Status: AC
  Administered 2023-12-15: 2 g via INTRAVENOUS
  Filled 2023-12-15: qty 50

## 2023-12-15 NOTE — Plan of Care (Signed)
  Problem: Education: Goal: Ability to describe self-care measures that may prevent or decrease complications (Diabetes Survival Skills Education) will improve Outcome: Progressing   Problem: Coping: Goal: Ability to adjust to condition or change in health will improve Outcome: Progressing   Problem: Fluid Volume: Goal: Ability to maintain a balanced intake and output will improve Outcome: Progressing   Problem: Nutritional: Goal: Maintenance of adequate nutrition will improve Outcome: Progressing   Problem: Skin Integrity: Goal: Risk for impaired skin integrity will decrease Outcome: Progressing   Problem: Education: Goal: Knowledge of General Education information will improve Description: Including pain rating scale, medication(s)/side effects and non-pharmacologic comfort measures Outcome: Progressing   Problem: Activity: Goal: Risk for activity intolerance will decrease Outcome: Progressing   Problem: Nutrition: Goal: Adequate nutrition will be maintained Outcome: Progressing   Problem: Pain Management: Goal: General experience of comfort will improve Outcome: Progressing   Problem: Safety: Goal: Ability to remain free from injury will improve Outcome: Progressing   Problem: Skin Integrity: Goal: Risk for impaired skin integrity will decrease Outcome: Progressing   Problem: Respiratory: Goal: Ability to maintain a clear airway and adequate ventilation will improve Outcome: Progressing

## 2023-12-15 NOTE — Assessment & Plan Note (Addendum)
Tylenol as needed.  IV magnesium earlier in hospital course.  Cryptococcus antigen negative.

## 2023-12-15 NOTE — Progress Notes (Signed)
 Progress Note   Patient: Meghan Jarvis FMW:[id number] DOB: 1976/04/07 DOA: 12/08/2023     7 DOS: the patient was seen and examined on 12/15/2023   Brief hospital course: 48 year old female past medical history of alcohol abuse, cocaine abuse, homelessness, AIDS, atrial fibrillation, hypothyroidism, type 2 diabetes mellitus presented to [facility] with acute respiratory failure.  She was found down by a friend and could not wake her up.  She was found to be hypoxic in the 70s.  Since she was obtunded she was intubated and admitted by the critical care service.  He was started on antibiotics for pneumonia.  Urine toxicology positive for cocaine.  Patient extubated on 12/30.  12/12/2023.  Patient feeling okay.  Spoke with patient's friend [name] on the phone.  The patient is dating her son. 1/2.  Patient complained of chest pain.  Cardiac enzymes negative x 2 and EKG did not show any ST elevation. 1/3.  Patient complaining of abdominal pain.  Will get right upper quadrant ultrasound negative.  MRI of the brain did not show any toxoplasmosis lesions but did show advanced cerebral atrophy. 1/4.  Patient complaining of headache.  Will give IV magnesium .  Assessment and Plan: * Headache Will give IV magnesium .  Cryptococcus antigen negative.  Acute hypoxic respiratory failure [facility]) Patient extubated on 12/30.  Patient currently breathing on room air.  Severe sepsis (HCC) Present on admission with tachycardia, tachypnea, fever, pneumonia, acute respiratory failure and acute metabolic encephalopathy.  Patient completed antibiotics.  Pneumonia of right lower lobe due to infectious organism Completed 5 days of antibiotic.  Acute metabolic encephalopathy Could be secondary to pneumonia and/or hypoxia from acute respiratory failure.  Mental status improved from admission.  MRI did not show any enhancing lesions but did show severe cerebral atrophy.  Protein-calorie malnutrition,  severe Continue supplements  AIDS (HCC) Viral load 2 million, CD4 14.  Infectious disease following.  Placed on PCP and MAI prophylaxis with Bactrim  and Zithromax .  Toxoplasma antibody IgG came back elevated and MRI of the brain does not show any enhancing lesions.  Infectious disease specialist started on Biktarvy .  Right upper quadrant abdominal pain Ultrasound of the right upper quadrant negative for cholecystitis. Empiric Protonix .  Cocaine abuse (HCC) Must stop cocaine  Alcohol abuse Continue thiamine  multivitamin and folic acid   Uncontrolled type 2 diabetes mellitus with hypoglycemia, without long-term current use of insulin  (HCC) Hemoglobin A1c 6.0.   Underweight (BMI < 18.5) Last BMI 18.47  Diarrhea  As needed Imodium .  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  Platelet platelet count up in the normal range at 165, hemoglobin 9.3 and white blood cell count 3.2.  Chest pain Atypical in nature.  Troponin x 2 negative.  EKG did not show any ST elevations.        Subjective: Patient complains of headache today.  2 days ago was chest pain and yesterday abdominal pain.  Admitted with acute respiratory failure and pneumonia and sepsis.  Physical Exam: Vitals:   12/14/23 1500 12/14/23 1604 12/14/23 2040 12/15/23 0749  BP:  108/81 90/79 (!) 96/55  Pulse:  84 91 87  Resp:  16 20   Temp: 98.8 F (37.1 C) 98.8 F (37.1 C) 98.2 F (36.8 C) 98.1 F (36.7 C)  TempSrc:      SpO2:  100% 99% 100%  Weight:      Height:       Physical Exam HENT:     Head: Normocephalic.  Eyes:     General: Lids are normal.     Conjunctiva/sclera: Conjunctivae normal.  Cardiovascular:     Rate and Rhythm: Normal rate and regular rhythm.     Heart sounds: Normal heart sounds, S1 normal and S2 normal.  Pulmonary:     Breath sounds: Examination of the right-lower field reveals decreased breath sounds. Examination of the left-lower  field reveals decreased breath sounds. Decreased breath sounds present. No wheezing, rhonchi or rales.  Abdominal:     Palpations: Abdomen is soft.     Tenderness: There is no abdominal tenderness.  Musculoskeletal:     Right lower leg: No swelling.     Left lower leg: No swelling.     Comments: Left thumb swelling.  Skin:    General: Skin is warm.     Comments: Chest wall blisters popped.  Blister left thumb.  Neurological:     Mental Status: She is alert.     Data Reviewed: Creatinine 0.77, hemoglobin 9.3, white blood cell 3.2, platelet count 165 MRI of the brain shows advanced cerebral atrophy and no enhancing lesions. Ultrasound of the abdomen shows no gallstones or ductal dilations  Family Communication: Spoke with friend on the phone about physical therapy recommending rehab to get stronger.  Disposition: Status is: Inpatient Remains inpatient appropriate because: Physical therapy recommending rehab.  IV magnesium  for headache.  Planned Discharge Destination: Rehab    Time spent: 28 minutes  Author: Charlie Patterson, MD 12/15/2023 1:10 PM  For on call review [email or web address].

## 2023-12-15 NOTE — Assessment & Plan Note (Addendum)
Last BMI 19.88

## 2023-12-15 NOTE — Progress Notes (Signed)
 Physical Therapy Treatment Patient Details Name: Meghan Jarvis MRN: [id number] DOB: 1976/02/03 Today's Date: 12/15/2023   History of Present Illness Pt is a 48 year old female admitted with [facility] with acute hypoxic respiratory failure, severe sepsis, community acquired PNA acute metabolic encephalopathy; She was intubated, extubated 12/30    PMH significant for alcohol abuse, cocaine abuse, homelessness, AIDS, atrial fibrillation, hypothyroidism, type 2 diabetes mellitus    PT Comments  Pt tolerated treatment well today and was able to improve overall assist levels, seated/standing balance, ambulation, attention to task, and redirection from last session. Today, she required mod assist for bed mobility, min assist for transfers with RW, and mod assist to ambulate 60ft from EOB>recliner with RW. Pt requiring increased assist for stepping facilitation and weight shift secondary to impaired motor planning and cognition, however, pt able to demonstrate improvements in facilitation of gait with repetition.   Education provided for 10/10 R flank pain management including log roll technique for bed mobility and splinting with coughing/sneezing. Despite progress, she continues to be limited with meeting goals secondary to impaired cognition, processing, functional weakness, and decreased gross balance. Due to deficits, she requires increased assist and multimodal cues for attention to task and sequencing of movement. She will continue to benefit from skilled therapy services to address deficits and improve overall safety with functional mobility. Will continue per POC.   If plan is discharge home, recommend the following: A lot of help with walking and/or transfers;Assistance with cooking/housework;A lot of help with bathing/dressing/bathroom;Direct supervision/assist for financial management;Direct supervision/assist for medications management;Assist for transportation;Help with stairs or ramp for  entrance;Supervision due to cognitive status   Can travel by private vehicle     Yes  Equipment Recommendations   (defer to post acute)    \   Precautions / Restrictions Precautions Precautions: Fall     Mobility  Bed Mobility Overal bed mobility: Needs Assistance Bed Mobility: Rolling, Sidelying to Sit Rolling: Mod assist Sidelying to sit: Mod assist       General bed mobility comments: mod assist to perform log roll to exit bed to the LEFT with max multimodal cues for safety and sequencing    Transfers Overall transfer level: Needs assistance Equipment used: Rolling walker (2 wheels) Transfers: Sit to/from Stand Sit to Stand: Min assist           General transfer comment: min assist for power to stand from elevated bed height with RW; multimodal cues for safety, sequencing, and hand placement. Min assist for controlled descent to sit in recliner with RW; multimodal cues for RW proximity, sequencing, and hand placement. Demonstrates poor eccentric lowering with UE support.    Ambulation/Gait Ambulation/Gait assistance: Mod assist Gait Distance (Feet): 3 Feet Assistive device: Rolling walker (2 wheels)         General Gait Details: Difficulty with sequencing secondary to cognition requiring increased assist for weight shift and LLE facilitation for steps to the recliner with RW. Able to facilitate RLE with stepping with assist for appropriate weight shift. Improved facilitation of stepping with repetition, able to perform 1 small step bilaterally towards end of gait.     Balance   Sitting-balance support: Bilateral upper extremity supported Sitting balance-Leahy Scale: Fair Sitting balance - Comments: BUE support on bed   Standing balance support: During functional activity, Bilateral upper extremity supported, Reliant on assistive device for balance Standing balance-Leahy Scale: Poor Standing balance comment: mod assist to maintain balance in RW  Cognition Arousal: Alert Behavior During Therapy: WFL for tasks assessed/performed, Flat affect Overall Cognitive Status: Impaired/Different from baseline                                 General Comments: increased distraction of wet gown, redirects with cues        Exercises Other Exercises Other Exercises: Pt educated re: PT role/POC, purpose of visit, benefits of OOB mobility and eating breakfast in the recliner, gait facilitation in RW, transfers, log roll for pain management, splinting for pain management with coughing.    General Comments General comments (skin integrity, edema, etc.): mepilex covering R knee blister; healing wound to thumb      Pertinent Vitals/Pain Pain Assessment Pain Assessment: 0-10 Pain Score: 10-Worst pain ever Pain Location: R sided flank pain Pain Intervention(s): Monitored during session           PT Goals (current goals can now be found in the care plan section) Acute Rehab PT Goals Patient Stated Goal: to feel better PT Goal Formulation: With patient Time For Goal Achievement: 12/26/23 Potential to Achieve Goals: Fair Progress towards PT goals: Progressing toward goals    Frequency    Min 1X/week      PT Plan         AM-PAC PT 6 Clicks Mobility   Outcome Measure  Help needed turning from your back to your side while in a flat bed without using bedrails?: A Lot Help needed moving from lying on your back to sitting on the side of a flat bed without using bedrails?: A Lot Help needed moving to and from a bed to a chair (including a wheelchair)?: A Lot Help needed standing up from a chair using your arms (e.g., wheelchair or bedside chair)?: A Little Help needed to walk in hospital room?: A Lot Help needed climbing 3-5 steps with a railing? : Total 6 Click Score: 12    End of Session Equipment Utilized During Treatment: Gait belt Activity Tolerance: Patient tolerated treatment  well Patient left: in chair;with call bell/phone within reach;with chair alarm set Nurse Communication: Mobility status PT Visit Diagnosis: Other abnormalities of gait and mobility (R26.89);Difficulty in walking, not elsewhere classified (R26.2);Muscle weakness (generalized) (M62.81)     Time: 9054-8993 PT Time Calculation (min) (ACUTE ONLY): 21 min  Charges:    $Therapeutic Activity: 8-22 mins PT General Charges $$ ACUTE PT VISIT: 1 Visit                      Camie CHARLENA Kluver, PT, DPT 10:20 AM,12/15/23 Physical Therapist - [facility]

## 2023-12-15 NOTE — TOC Progression Note (Signed)
 Transition of Care [facility]) - Progression Note    Patient Details  Name: Meghan Jarvis MRN: [id number] Date of Birth: [birth year]  [facility] CM/SW Contact  Alvia Olam Fabry, RN Phone Number: 12/15/2023, 3:26 PM  Clinical Narrative:      Spoke with the bedside nurse concerning this pt's mentality for discussing SNF placement as recommended. Informed pt does not have the mental capacity for discussion. Nurse states she is only alert to person and place not situation with no DPR or family available. Inquired on the friend noted in report and nurse indicated it was a friend that found her prior to hospitalization. Unable to proceed with work up for SNF. RN states there is no friend available to represent the pt.  TOC will continue to follow for discharge disposition.        Expected Discharge Plan and Services                                               Social Determinants of Health (SDOH) Interventions SDOH Screenings   Food Insecurity: Patient Unable To Answer (12/09/2023)  Recent Concern: Food Insecurity - Food Insecurity Present (09/26/2023)  Housing: Patient Unable To Answer (12/09/2023)  Recent Concern: Housing - Medium Risk (09/26/2023)  Transportation Needs: Patient Unable To Answer (12/09/2023)  Recent Concern: Transportation Needs - Unmet Transportation Needs (10/11/2023)  Utilities: Patient Unable To Answer (12/09/2023)  Recent Concern: Utilities - At Risk (09/25/2023)  Tobacco Use: High Risk (12/08/2023)    Readmission Risk Interventions     No data to display

## 2023-12-15 NOTE — Plan of Care (Signed)
  Problem: Education: Goal: Ability to describe self-care measures that may prevent or decrease complications (Diabetes Survival Skills Education) will improve Outcome: Progressing Goal: Individualized Educational Video(s) Outcome: Progressing   Problem: Coping: Goal: Ability to adjust to condition or change in health will improve Outcome: Progressing   Problem: Fluid Volume: Goal: Ability to maintain a balanced intake and output will improve Outcome: Progressing   Problem: Health Behavior/Discharge Planning: Goal: Ability to identify and utilize available resources and services will improve Outcome: Progressing Goal: Ability to manage health-related needs will improve Outcome: Progressing   Problem: Metabolic: Goal: Ability to maintain appropriate glucose levels will improve Outcome: Progressing   Problem: Nutritional: Goal: Maintenance of adequate nutrition will improve Outcome: Progressing Goal: Progress toward achieving an optimal weight will improve Outcome: Progressing   Problem: Skin Integrity: Goal: Risk for impaired skin integrity will decrease Outcome: Progressing   Problem: Tissue Perfusion: Goal: Adequacy of tissue perfusion will improve Outcome: Progressing   Problem: Education: Goal: Knowledge of General Education information will improve Description: Including pain rating scale, medication(s)/side effects and non-pharmacologic comfort measures Outcome: Progressing   Problem: Health Behavior/Discharge Planning: Goal: Ability to manage health-related needs will improve Outcome: Progressing   Problem: Clinical Measurements: Goal: Ability to maintain clinical measurements within normal limits will improve Outcome: Progressing Goal: Will remain free from infection Outcome: Progressing Goal: Diagnostic test results will improve Outcome: Progressing Goal: Respiratory complications will improve Outcome: Progressing Goal: Cardiovascular complication will  be avoided Outcome: Progressing   Problem: Activity: Goal: Risk for activity intolerance will decrease Outcome: Progressing   Problem: Nutrition: Goal: Adequate nutrition will be maintained Outcome: Progressing   Problem: Coping: Goal: Level of anxiety will decrease Outcome: Progressing   Problem: Elimination: Goal: Will not experience complications related to bowel motility Outcome: Progressing Goal: Will not experience complications related to urinary retention Outcome: Progressing   Problem: Pain Management: Goal: General experience of comfort will improve Outcome: Progressing   Problem: Safety: Goal: Ability to remain free from injury will improve Outcome: Progressing   Problem: Skin Integrity: Goal: Risk for impaired skin integrity will decrease Outcome: Progressing   Problem: Activity: Goal: Ability to tolerate increased activity will improve Outcome: Progressing   Problem: Respiratory: Goal: Ability to maintain a clear airway and adequate ventilation will improve Outcome: Progressing

## 2023-12-15 NOTE — Progress Notes (Addendum)
 patient is crying and  and stated that wants to go home. she is saying, she got a call from mother, her mother is sick and wants to see her. Educated her about the importance of physical therapy. MD Made aware.plan of care ongoing.

## 2023-12-16 DIAGNOSIS — R079 Chest pain, unspecified: Secondary | ICD-10-CM

## 2023-12-16 DIAGNOSIS — B20 Human immunodeficiency virus [HIV] disease: Secondary | ICD-10-CM | POA: Diagnosis not present

## 2023-12-16 DIAGNOSIS — I9589 Other hypotension: Secondary | ICD-10-CM | POA: Diagnosis not present

## 2023-12-16 DIAGNOSIS — J9601 Acute respiratory failure with hypoxia: Secondary | ICD-10-CM | POA: Diagnosis not present

## 2023-12-16 DIAGNOSIS — G4489 Other headache syndrome: Secondary | ICD-10-CM | POA: Diagnosis not present

## 2023-12-16 DIAGNOSIS — I959 Hypotension, unspecified: Secondary | ICD-10-CM | POA: Diagnosis present

## 2023-12-16 LAB — COMPREHENSIVE METABOLIC PANEL
ALT: 33 U/L (ref 0–44)
AST: 43 U/L — ABNORMAL HIGH (ref 15–41)
Albumin: 2.5 g/dL — ABNORMAL LOW (ref 3.5–5.0)
Alkaline Phosphatase: 38 U/L (ref 38–126)
Anion gap: 9 (ref 5–15)
BUN: 18 mg/dL (ref 6–20)
CO2: 23 mmol/L (ref 22–32)
Calcium: 8.2 mg/dL — ABNORMAL LOW (ref 8.9–10.3)
Chloride: 112 mmol/L — ABNORMAL HIGH (ref 98–111)
Creatinine, Ser: 0.73 mg/dL (ref 0.44–1.00)
GFR, Estimated: >60 mL/min (ref >60)
Glucose, Bld: 126 mg/dL — ABNORMAL HIGH (ref 70–99)
Potassium: 3.9 mmol/L (ref 3.5–5.1)
Sodium: 144 mmol/L (ref 135–145)
Total Bilirubin: 0.3 mg/dL (ref 0.0–1.2)
Total Protein: 6.8 g/dL (ref 6.5–8.1)

## 2023-12-16 LAB — CBC
HCT: 27.4 % — ABNORMAL LOW (ref 36.0–46.0)
Hemoglobin: 8.6 g/dL — ABNORMAL LOW (ref 12.0–15.0)
MCH: 27 pg (ref 26.0–34.0)
MCHC: 31.4 g/dL (ref 30.0–36.0)
MCV: 86.2 fL (ref 80.0–100.0)
Platelets: 231 10*3/uL (ref 150–400)
RBC: 3.18 MIL/uL — ABNORMAL LOW (ref 3.87–5.11)
RDW: 14.9 % (ref 11.5–15.5)
WBC: 4.7 10*3/uL (ref 4.0–10.5)
nRBC: 0 % (ref 0.0–0.2)

## 2023-12-16 LAB — GLUCOSE, CAPILLARY: Glucose-Capillary: 103 mg/dL — ABNORMAL HIGH (ref 70–99)

## 2023-12-16 LAB — FERRITIN: Ferritin: 372 ng/mL — ABNORMAL HIGH (ref 11–307)

## 2023-12-16 MED ORDER — KETOROLAC TROMETHAMINE 15 MG/ML IJ SOLN
15.0000 mg | Freq: Once | INTRAMUSCULAR | Status: AC
  Administered 2023-12-16: 15 mg via INTRAVENOUS
  Filled 2023-12-16: qty 1

## 2023-12-16 MED ORDER — SODIUM CHLORIDE 0.9 % IV BOLUS
500.0000 mL | Freq: Once | INTRAVENOUS | Status: AC
  Administered 2023-12-16: 500 mL via INTRAVENOUS

## 2023-12-16 MED ORDER — MIDODRINE HCL 5 MG PO TABS
10.0000 mg | ORAL_TABLET | Freq: Three times a day (TID) | ORAL | Status: DC
  Administered 2023-12-16 – 2023-12-26 (×27): 10 mg via ORAL
  Filled 2023-12-16 (×28): qty 2

## 2023-12-16 MED ORDER — ACETAMINOPHEN 325 MG PO TABS
650.0000 mg | ORAL_TABLET | Freq: Four times a day (QID) | ORAL | Status: DC | PRN
  Administered 2023-12-16 – 2023-12-27 (×9): 650 mg via ORAL
  Filled 2023-12-16 (×9): qty 2

## 2023-12-16 NOTE — Progress Notes (Signed)
   12/16/23 1035  Assess: MEWS Score  BP (!) 73/60 (after bolus, MD notified)  MAP (mmHg) 66  Pulse Rate 92  Resp 18  Level of Consciousness Alert  SpO2 100 %  O2 Device Room Air  Assess: MEWS Score  MEWS Temp 0  MEWS Systolic 2  MEWS Pulse 0  MEWS RR 0  MEWS LOC 0  MEWS Score 2  MEWS Score Color Yellow  Assess: if the MEWS score is Yellow or Red  Were vital signs accurate and taken at a resting state? Yes  Does the patient meet 2 or more of the SIRS criteria? No  MEWS guidelines implemented  Yes, yellow  Treat  MEWS Interventions Considered administering scheduled or prn medications/treatments as ordered  Take Vital Signs  Increase Vital Sign Frequency  Yellow: Q2hr x1, continue Q4hrs until patient remains green for 12hrs  Escalate  MEWS: Escalate Yellow: Discuss with charge nurse and consider notifying provider and/or RRT  Notify: Charge Nurse/RN  Name of Charge Nurse/RN Notified Jo RN  Provider Notification  Provider Name/Title Wieting MD  Date Provider Notified 12/16/23  Time Provider Notified 1040  Method of Notification Page  Notification Reason Other (Comment) (hypotensive post initial bolus)  Provider response See new orders (bolus and midodrine )  Date of Provider Response 12/16/23  Time of Provider Response 1041  Assess: SIRS CRITERIA  SIRS Temperature  0  SIRS Respirations  0  SIRS Pulse 1  SIRS WBC 0  SIRS Score [name]  1

## 2023-12-16 NOTE — Assessment & Plan Note (Addendum)
 Resolved. No longer on midodrine to 2.5 mg 3 times daily.

## 2023-12-16 NOTE — Progress Notes (Signed)
 Progress Note   Patient: Meghan Jarvis FMW:[id number] DOB: [birth year] DOA: 12/08/2023     8 DOS: the patient was seen and examined on 12/16/2023   Brief hospital course: 48 year old female past medical history of alcohol abuse, cocaine abuse, homelessness, AIDS, atrial fibrillation, hypothyroidism, type 2 diabetes mellitus presented to [facility] with acute respiratory failure.  She was found down by a friend and could not wake her up.  She was found to be hypoxic in the 70s.  Since she was obtunded she was intubated and admitted by the critical care service.  He was started on antibiotics for pneumonia.  Urine toxicology positive for cocaine.  Patient extubated on 12/30.  12/12/2023.  Patient feeling okay.  Spoke with patient's friend [name] on the phone.  The patient is dating her son. 1/2.  Patient complained of chest pain.  Cardiac enzymes negative x 2 and EKG did not show any ST elevation. 1/3.  Patient complaining of abdominal pain.  Will get right upper quadrant ultrasound negative.  MRI of the brain did not show any toxoplasmosis lesions but did show advanced cerebral atrophy. 1/4.  Patient complaining of headache.  Will give IV magnesium .  Patient walked 3 feet with physical therapy. 1/5.  Blood pressure this morning was low and 2 fluid boluses given.  Started on midodrine .  Assessment and Plan: * Hypotension 2 fluid boluses given and midodrine  started.  I believe the patient likely has low blood pressure all the time.  Headache Did not complain of headache today.  IV magnesium  given yesterday.  Cryptococcus antigen negative.  Acute hypoxic respiratory failure [facility]) Patient extubated on 12/30.  Patient currently breathing on room air.  Severe sepsis (HCC) Present on admission with tachycardia, tachypnea, fever, pneumonia, acute respiratory failure and acute metabolic encephalopathy.  Patient completed antibiotics.  Pneumonia of right lower lobe due to infectious  organism Completed 5 days of antibiotic.  Acute metabolic encephalopathy Could be secondary to pneumonia and/or hypoxia from acute respiratory failure.  Mental status improved from admission.  MRI did not show any enhancing lesions but did show severe cerebral atrophy.  Protein-calorie malnutrition, severe Continue supplements  AIDS (HCC) Viral load 2 million, CD4 14.  Infectious disease following.  Placed on PCP and MAI prophylaxis with Bactrim  and Zithromax .  Toxoplasma antibody IgG came back elevated and MRI of the brain does not show any enhancing lesions.  Infectious disease specialist started on Biktarvy .  Right upper quadrant abdominal pain Ultrasound of the right upper quadrant negative for cholecystitis. Empiric Protonix .  Cocaine abuse (HCC) Must stop cocaine  Alcohol abuse Continue thiamine  multivitamin and folic acid   Uncontrolled type 2 diabetes mellitus with hypoglycemia, without long-term current use of insulin  (HCC) Hemoglobin A1c 6.0.   Underweight (BMI < 18.5) Last BMI 18.47  Diarrhea  As needed Imodium .  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  Platelet platelet count up in the normal range at 231, white blood cell count up in the normal range at 4.7, hemoglobin 8.6.  Hemoglobin likely drifted down with fluid boluses given today.  Chest pain Atypical in nature.  Troponin x 2 negative.  EKG did not show any ST elevations.        Subjective: Patient told me this morning she feels okay but I think she just wants to go home.  I told her that she needs to be able to walk in order to go home.  Right now we are recommending rehab.  Physical  Exam: Vitals:   12/16/23 0743 12/16/23 0743 12/16/23 1035 12/16/23 1300  BP: (!) 86/58 (!) 86/58 (!) 73/60 94/72  Pulse: 90 90 92 85  Resp: 16 16 18 16   Temp: 98.7 F (37.1 C) 98.7 F (37.1 C)  98.8 F (37.1 C)  TempSrc:      SpO2: 97% 97% 100% 98%  Weight:       Height:       Physical Exam HENT:     Head: Normocephalic.  Eyes:     General: Lids are normal.     Conjunctiva/sclera: Conjunctivae normal.  Cardiovascular:     Rate and Rhythm: Normal rate and regular rhythm.     Heart sounds: Normal heart sounds, S1 normal and S2 normal.  Pulmonary:     Breath sounds: Examination of the right-lower field reveals decreased breath sounds. Examination of the left-lower field reveals decreased breath sounds. Decreased breath sounds present. No wheezing, rhonchi or rales.  Abdominal:     Palpations: Abdomen is soft.     Tenderness: There is no abdominal tenderness.  Musculoskeletal:     Right lower leg: No swelling.     Left lower leg: No swelling.     Comments: Left thumb swelling.  Skin:    General: Skin is warm.     Comments: Chest wall blisters popped.  Blister left thumb.  Neurological:     Mental Status: She is alert.     Data Reviewed: White blood cell count 4.7, hemoglobin 8.6, platelet count 231, creatinine 0.73, sodium 144, potassium 3.9  Disposition: Status is: Inpatient Remains inpatient appropriate because: Patient will need rehab.  Planned Discharge Destination: Rehab    Time spent: 28 minutes  Author: Charlie Patterson, MD 12/16/2023 3:26 PM  For on call review [email or web address].

## 2023-12-16 NOTE — Progress Notes (Signed)
 MEWS Progress Note  Patient Details Name: Meghan Jarvis MRN: [id number] DOB: March 22, 1976 Today's Date: 12/16/2023   MEWS Flowsheet Documentation:  Assess: MEWS Score Temp: 98.7 F (37.1 C) BP: (!) 73/60 (after bolus, MD notified) MAP (mmHg): 66 Pulse Rate: 92 ECG Heart Rate: 88 Resp: 18 Level of Consciousness: Alert SpO2: 100 % O2 Device: Room Air Patient Activity (if Appropriate): In bed O2 Flow Rate (L/min): 2 L/min FiO2 (%): 28 % Assess: MEWS Score MEWS Temp: 0 MEWS Systolic: 2 MEWS Pulse: 0 MEWS RR: 0 MEWS LOC: 0 MEWS Score: 2 MEWS Score Color: Yellow Assess: SIRS CRITERIA SIRS Temperature : 0 SIRS Respirations : 0 SIRS Pulse: 1 SIRS WBC: 0 SIRS Score [name] : 1 SIRS Temperature : 0 SIRS Pulse: 1 SIRS Respirations : 0 SIRS WBC: 0 SIRS Score [name] : 1 Assess: if the MEWS score is Yellow or Red Were vital signs accurate and taken at a resting state?: Yes Does the patient meet 2 or more of the SIRS criteria?: No MEWS guidelines implemented : Yes, yellow Treat MEWS Interventions: Considered administering scheduled or prn medications/treatments as ordered Take Vital Signs Increase Vital Sign Frequency : Yellow: Q2hr x1, continue Q4hrs until patient remains green for 12hrs Escalate MEWS: Escalate: Yellow: Discuss with charge nurse and consider notifying provider and/or RRT Notify: Charge Nurse/RN Name of Charge Nurse/RN Notified: Recruitment Consultant Provider Notification Provider Name/Title: Josette MD Date Provider Notified: 12/16/23 Time Provider Notified: 1040 Method of Notification: Page Notification Reason: Other (Comment) (hypotensive post initial bolus) Provider response: See new orders (bolus and midodrine ) Date of Provider Response: 12/16/23 Time of Provider Response: 1041      [name] 12/16/2023, 11:13 AM

## 2023-12-17 ENCOUNTER — Inpatient Hospital Stay: Payer: MEDICAID

## 2023-12-17 DIAGNOSIS — J9601 Acute respiratory failure with hypoxia: Secondary | ICD-10-CM | POA: Diagnosis not present

## 2023-12-17 DIAGNOSIS — B20 Human immunodeficiency virus [HIV] disease: Secondary | ICD-10-CM | POA: Diagnosis not present

## 2023-12-17 DIAGNOSIS — R1084 Generalized abdominal pain: Secondary | ICD-10-CM

## 2023-12-17 DIAGNOSIS — J189 Pneumonia, unspecified organism: Secondary | ICD-10-CM | POA: Diagnosis not present

## 2023-12-17 DIAGNOSIS — E43 Unspecified severe protein-calorie malnutrition: Secondary | ICD-10-CM

## 2023-12-17 DIAGNOSIS — R109 Unspecified abdominal pain: Secondary | ICD-10-CM

## 2023-12-17 DIAGNOSIS — A419 Sepsis, unspecified organism: Secondary | ICD-10-CM | POA: Diagnosis not present

## 2023-12-17 DIAGNOSIS — I9589 Other hypotension: Secondary | ICD-10-CM | POA: Diagnosis not present

## 2023-12-17 LAB — MISC LABCORP TEST (SEND OUT): Labcorp test code: 138602

## 2023-12-17 MED ORDER — SULFAMETHOXAZOLE-TRIMETHOPRIM 800-160 MG PO TABS
1.0000 | ORAL_TABLET | Freq: Every day | ORAL | Status: DC
Start: 2023-12-18 — End: 2023-12-24
  Administered 2023-12-18 – 2023-12-23 (×6): 1 via ORAL
  Filled 2023-12-17 (×7): qty 1

## 2023-12-17 MED ORDER — IOHEXOL 9 MG/ML PO SOLN: 500.0000 mL | Freq: Once | ORAL | Status: DC | PRN

## 2023-12-17 MED ORDER — IOHEXOL 300 MG/ML  SOLN
100.0000 mL | Freq: Once | INTRAMUSCULAR | Status: AC | PRN
  Administered 2023-12-17: 75 mL via INTRAVENOUS

## 2023-12-17 MED ORDER — POLYETHYLENE GLYCOL 3350 17 G PO PACK
17.0000 g | PACK | Freq: Every day | ORAL | Status: DC
  Administered 2023-12-17 – 2023-12-24 (×7): 17 g via ORAL
  Filled 2023-12-17 (×9): qty 1

## 2023-12-17 NOTE — Progress Notes (Signed)
 Physical Therapy Treatment Patient Details Name: Meghan Jarvis MRN: [id number] DOB: [birth year] Today's Date: 12/17/2023   History of Present Illness Pt is a 48 year old female admitted with [facility] with acute hypoxic respiratory failure, severe sepsis, community acquired PNA acute metabolic encephalopathy; She was intubated, extubated 12/30    PMH significant for alcohol abuse, cocaine abuse, homelessness, AIDS, atrial fibrillation, hypothyroidism, type 2 diabetes mellitus    PT Comments  Initially seems to decline session but then easily agrees with redirection to use BSC.  Able to get to EOB with cga x 1.  Stands and transfers to [facility] with RW and cga/min a x 1.  Does own self care.  She is able to participate in standing ex with walker and mod cues to stay on task.  Walks 20' with RW and 20' with HHA as she stated she did not think she needed RW but later agreed it did help her.  She demonstrated decreased safety and balance throughout session and should have +1 assist for all mobility.  She does remain up in chair after session with needs met and safety on.     If plan is discharge home, recommend the following: Assistance with cooking/housework;Direct supervision/assist for financial management;Direct supervision/assist for medications management;Assist for transportation;Help with stairs or ramp for entrance;Supervision due to cognitive status;A little help with walking and/or transfers;A little help with bathing/dressing/bathroom   Can travel by private vehicle        Equipment Recommendations  Rolling walker (2 wheels)    Recommendations for Other Services       Precautions / Restrictions Precautions Precautions: Fall Restrictions Weight Bearing Restrictions Per Provider Order: No     Mobility  Bed Mobility Overal bed mobility: Needs Assistance Bed Mobility: Supine to Sit     Supine to sit: Contact guard       Patient Response: Cooperative  Transfers Overall transfer  level: Needs assistance Equipment used: Rolling walker (2 wheels), None Transfers: Sit to/from Stand Sit to Stand: Min assist                Ambulation/Gait Ambulation/Gait assistance: Min assist, Contact guard assist Gait Distance (Feet): 20 Feet Assistive device: Rolling walker (2 wheels), 1 person hand held assist Gait Pattern/deviations: Step-through pattern Gait velocity: dec     General Gait Details: generally unsteady with or without RW   Stairs             Wheelchair Mobility     Tilt Bed Tilt Bed Patient Response: Cooperative  Modified Rankin (Stroke Patients Only)       Balance Overall balance assessment: Needs assistance Sitting-balance support: Bilateral upper extremity supported Sitting balance-Leahy Scale: Fair     Standing balance support: During functional activity, No upper extremity supported Standing balance-Leahy Scale: Poor Standing balance comment: improved but still impaired with RW                            Cognition Arousal: Alert Behavior During Therapy: WFL for tasks assessed/performed, Flat affect Overall Cognitive Status: No family/caregiver present to determine baseline cognitive functioning                                          Exercises Other Exercises Other Exercises: to [facility] to void and participates in standing ex with walker support and mod cues to  maintan and stay on task    General Comments        Pertinent Vitals/Pain Pain Assessment Pain Assessment: No/denies pain Pain Intervention(s): Monitored during session    Home Living                          Prior Function            PT Goals (current goals can now be found in the care plan section) Progress towards PT goals: Progressing toward goals    Frequency    Min 1X/week      PT Plan      Co-evaluation              AM-PAC PT 6 Clicks Mobility   Outcome Measure  Help needed turning from  your back to your side while in a flat bed without using bedrails?: None Help needed moving from lying on your back to sitting on the side of a flat bed without using bedrails?: A Little Help needed moving to and from a bed to a chair (including a wheelchair)?: A Little Help needed standing up from a chair using your arms (e.g., wheelchair or bedside chair)?: A Little Help needed to walk in hospital room?: A Little Help needed climbing 3-5 steps with a railing? : A Lot 6 Click Score: 18    End of Session Equipment Utilized During Treatment: Gait belt Activity Tolerance: Patient tolerated treatment well Patient left: in chair;with call bell/phone within reach;with chair alarm set Nurse Communication: Mobility status PT Visit Diagnosis: Other abnormalities of gait and mobility (R26.89);Difficulty in walking, not elsewhere classified (R26.2);Muscle weakness (generalized) (M62.81)     Time: 9045-8987 PT Time Calculation (min) (ACUTE ONLY): 18 min  Charges:    $Gait Training: 8-22 mins PT General Charges $$ ACUTE PT VISIT: 1 Visit                   Lauraine Gills, PTA 12/17/23, 1:34 PM

## 2023-12-17 NOTE — Plan of Care (Signed)
  Problem: Education: Goal: Ability to describe self-care measures that may prevent or decrease complications (Diabetes Survival Skills Education) will improve Outcome: Progressing Goal: Individualized Educational Video(s) Outcome: Progressing   Problem: Coping: Goal: Ability to adjust to condition or change in health will improve Outcome: Progressing   Problem: Fluid Volume: Goal: Ability to maintain a balanced intake and output will improve Outcome: Progressing   Problem: Health Behavior/Discharge Planning: Goal: Ability to identify and utilize available resources and services will improve Outcome: Progressing Goal: Ability to manage health-related needs will improve Outcome: Progressing   Problem: Metabolic: Goal: Ability to maintain appropriate glucose levels will improve Outcome: Progressing   Problem: Nutritional: Goal: Maintenance of adequate nutrition will improve Outcome: Progressing Goal: Progress toward achieving an optimal weight will improve Outcome: Progressing   Problem: Skin Integrity: Goal: Risk for impaired skin integrity will decrease Outcome: Progressing   Problem: Tissue Perfusion: Goal: Adequacy of tissue perfusion will improve Outcome: Progressing   Problem: Education: Goal: Knowledge of General Education information will improve Description: Including pain rating scale, medication(s)/side effects and non-pharmacologic comfort measures Outcome: Progressing   Problem: Health Behavior/Discharge Planning: Goal: Ability to manage health-related needs will improve Outcome: Progressing   Problem: Clinical Measurements: Goal: Ability to maintain clinical measurements within normal limits will improve Outcome: Progressing Goal: Will remain free from infection Outcome: Progressing Goal: Diagnostic test results will improve Outcome: Progressing Goal: Respiratory complications will improve Outcome: Progressing Goal: Cardiovascular complication will  be avoided Outcome: Progressing   Problem: Activity: Goal: Risk for activity intolerance will decrease Outcome: Progressing   Problem: Nutrition: Goal: Adequate nutrition will be maintained Outcome: Progressing   Problem: Coping: Goal: Level of anxiety will decrease Outcome: Progressing   Problem: Elimination: Goal: Will not experience complications related to bowel motility Outcome: Progressing Goal: Will not experience complications related to urinary retention Outcome: Progressing   Problem: Pain Management: Goal: General experience of comfort will improve Outcome: Progressing   Problem: Safety: Goal: Ability to remain free from injury will improve Outcome: Progressing   Problem: Skin Integrity: Goal: Risk for impaired skin integrity will decrease Outcome: Progressing   Problem: Activity: Goal: Ability to tolerate increased activity will improve Outcome: Progressing   Problem: Respiratory: Goal: Ability to maintain a clear airway and adequate ventilation will improve Outcome: Progressing

## 2023-12-17 NOTE — Progress Notes (Signed)
 Date of Admission:  12/08/2023     ID: Meghan Jarvis is a 48 y.o. female  Principal Problem:   Hypotension Active Problems:   Alcohol abuse   Acute hypoxic respiratory failure (HCC)   Pneumonia of right lower lobe due to infectious organism   Protein-calorie malnutrition, severe   Acute metabolic encephalopathy   Uncontrolled type 2 diabetes mellitus with hypoglycemia, without long-term current use of insulin  (HCC)   Cocaine abuse (HCC)   AIDS (HCC)   Severe sepsis (HCC)   Chest pain   Pancytopenia (HCC)   Generalized weakness   Diarrhea   Right upper quadrant abdominal pain   Headache   Underweight (BMI < 18.5)  ? Meghan Jarvis is a 48 y.o. with a history of AIDS, not compliant with meds or visits ,  , cocaine use was brought in by EMS after being found unresponsive on the couch  in an aquaintance place-   Subjective: Pt is feeling better Sitting in bed   Medications:   azithromycin   1,250 mg Oral Weekly   bictegravir-emtricitabine -tenofovir  AF  1 tablet Oral Daily   enoxaparin  (LOVENOX ) injection  40 mg Subcutaneous QHS   feeding supplement  237 mL Oral TID BM   folic acid   1 mg Oral Daily   midodrine   10 mg Oral TID WC   multivitamin with minerals  1 tablet Oral Daily   pantoprazole   40 mg Oral BID   sulfamethoxazole -trimethoprim   1 tablet Oral Daily   thiamine   100 mg Oral Daily    Objective: Vital signs in last 24 hours: Patient Vitals for the past 24 hrs:  BP Temp Temp src Pulse Resp SpO2  12/17/23 0825 (!) 86/62 97.9 F (36.6 C) -- 80 16 100 %  12/17/23 0604 (!) 86/66 97.9 F (36.6 C) -- 77 18 99 %  12/17/23 0452 94/66 (!) 97.5 F (36.4 C) Oral 69 -- 100 %  12/17/23 0147 107/77 98 F (36.7 C) -- 67 18 99 %  12/16/23 2058 99/73 (!) 97.4 F (36.3 C) -- 95 19 (!) 88 %  12/16/23 1617 99/69 98 F (36.7 C) Oral 64 20 100 %    Awake and alert Chest b/l air entry [id number] Skin blister site are healing  Lab Results    Latest Ref Rng & Units  12/16/2023   12:29 PM 12/14/2023    4:08 AM 12/12/2023    3:17 AM  CBC  WBC 4.0 - 10.5 K/uL 4.7  3.2  3.0   Hemoglobin 12.0 - 15.0 g/dL 8.6  9.3  9.0   Hematocrit 36.0 - 46.0 % 27.4  30.1  28.8   Platelets 150 - 400 K/uL 231  165  116        Latest Ref Rng & Units 12/16/2023   12:29 PM 12/14/2023    4:08 AM 12/12/2023    3:17 AM  CMP  Glucose 70 - 99 mg/dL 873  893  892   BUN 6 - 20 mg/dL 18  13  12    Creatinine 0.44 - 1.00 mg/dL 9.26  9.22  9.37   Sodium 135 - 145 mmol/L 144  140  141   Potassium 3.5 - 5.1 mmol/L 3.9  3.5  3.4   Chloride 98 - 111 mmol/L 112  108  108   CO2 22 - 32 mmol/L 23  25  25    Calcium  8.9 - 10.3 mg/dL 8.2  8.5  8.2   Total Protein 6.5 - 8.1 g/dL  6.8     Total Bilirubin 0.0 - 1.2 mg/dL 0.3     Alkaline Phos 38 - 126 U/L 38     AST 15 - 41 U/L 43     ALT 0 - 44 U/L 33         Microbiology: 12/28 BC NG  Toxo IgG > 400 Toxo PCR neg igM neg RPR NR CMV DNA neg Crypto neg HIV RNA 2 million Cd4 is 14 ( 2.4%)     Assessment/Plan: Unresponsive /AMS secondary to cocaine use- resolved   Aspiration leading to acute hypoxic resp failure rt Middle lobe and lower lobe consolidation-- was intubated and mechanically ventilated in the beginning. Now extubated Was On zosyn  Completed rx   H/o fall with recurrent ED presentation- had hurt left shoulder before   Left thumb tender swelling- Xray hand no obvious fracture   Pruritus with clear blisters- D.D  Cocaine use Insect bite Scabies- no burrows R/o primary dermatological condition like pemphigus ( less likely)  Syphilis- RPR neg   AIDS- non compliant with meds or visits to her Provider- not been in care since 2021- used to be [facility] at [facility] before Was on Biktarvy  at one time and was non compliant then VL now is 2 million and cd4 is 11  Started Biktarvy  on 12/14/23 . not sure how much compliance to be expected if her social situation is not conducive and so is her substance use- watch closely for Immune  reconstitution inflammatory syndrome   Toxo IgG very high-  MRI brain no CNS lesions  . TOXO PCR negative On  PCP and MAI prophylaxis. Bactrim  will do Toxo prophylaxis as well       Anemia  leucopenia/thrombocytopenia could be due to AIDS ETOH abuse   improving  CMV DNA neg , AFB blood culture sent    ETOH abuse   CT abdomen done for pain abdomen stool throughout colon     Follow strict universal precautions when taking care of patient   Discussed the management with patient and  hospitalist

## 2023-12-17 NOTE — Progress Notes (Signed)
 Progress Note   Patient: Meghan Jarvis FMW:[id number] DOB: 04-24-1976 DOA: 12/08/2023     9 DOS: the patient was seen and examined on 12/17/2023   Brief hospital course: 48 year old female past medical history of alcohol abuse, cocaine abuse, homelessness, AIDS, atrial fibrillation, hypothyroidism, type 2 diabetes mellitus presented to [facility] regional with acute respiratory failure.  She was found down by a friend and could not wake her up.  She was found to be hypoxic in the 70s.  Since she was obtunded she was intubated and admitted by the critical care service.  He was started on antibiotics for pneumonia.  Urine toxicology positive for cocaine.  Patient extubated on 12/30.  12/12/2023.  Patient feeling okay.  Spoke with patient's friend [name] on the phone.  The patient is dating her son. 1/2.  Patient complained of chest pain.  Cardiac enzymes negative x 2 and EKG did not show any ST elevation. 1/3.  Patient complaining of abdominal pain.  Will get right upper quadrant ultrasound negative.  MRI of the brain did not show any toxoplasmosis lesions but did show advanced cerebral atrophy. 1/4.  Patient complaining of headache.  Will give IV magnesium .  Patient walked 3 feet with physical therapy. 1/5.  Blood pressure this morning was low and 2 fluid boluses given.  Started on midodrine . 1/6.  Patient complaining of a lot of abdominal pain.  CT scan does not show any acute process and shows better aeration of right lower lung.  CT did show constipation.  Will start MiraLAX .  Assessment and Plan: * Abdominal pain Patient had more abdominal pain.  Earlier in the hospital course had right upper quadrant pain with an ultrasound of the right upper quadrant was negative.  Today CT scan was negative for acute event but did show constipation.  Will start MiraLAX .  Hypotension Continue midodrine   Severe sepsis (HCC) Present on admission with tachycardia, tachypnea, fever, pneumonia, acute  respiratory failure and acute metabolic encephalopathy.  Patient completed antibiotics.  Acute hypoxic respiratory failure [facility]) Patient extubated on 12/30.  Patient currently breathing on room air.  Pneumonia of right lower lobe due to infectious organism Completed 5 days of antibiotic.  AIDS (HCC) Viral load 2 million, CD4 14.  Infectious disease following.  Placed on PCP and MAI prophylaxis with Bactrim  and Zithromax .  Toxoplasma antibody IgG came back elevated and MRI of the brain does not show any enhancing lesions.  Infectious disease specialist started on Biktarvy .  Acute metabolic encephalopathy Could be secondary to pneumonia and/or hypoxia from acute respiratory failure.  Mental status improved from admission.  MRI did not show any enhancing lesions but did show severe cerebral atrophy.  Headache Did not complain of headache today.  IV magnesium  given previously.  Cryptococcus antigen negative.  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  Platelet platelet count up in the normal range at 231, white blood cell count up in the normal range at 4.7, hemoglobin 8.6.  Hemoglobin likely drifted down with fluid boluses given.  Protein-calorie malnutrition, severe Continue supplements  Cocaine abuse (HCC) Must stop cocaine  Alcohol abuse Continue thiamine  multivitamin and folic acid   Uncontrolled type 2 diabetes mellitus with hypoglycemia, without long-term current use of insulin  (HCC) Hemoglobin A1c 6.0.   Underweight (BMI < 18.5) Last BMI 18.47  Diarrhea Likely overflow constipation causing her diarrhea.  Generalized weakness Physical therapy recommending rehab  Chest pain Atypical in nature.  Troponin x 2 negative.  EKG did not show any ST elevations.  Subjective: Patient today complains of a lot of abdominal pain.  Physical Exam: Vitals:   12/17/23 0452 12/17/23 0604 12/17/23 0825 12/17/23 1624  BP: 94/66 (!) 86/66 (!) 86/62 101/75  Pulse: 69 77 80 73   Resp:  18 16 18   Temp: (!) 97.5 F (36.4 C) 97.9 F (36.6 C) 97.9 F (36.6 C) 98.4 F (36.9 C)  TempSrc: Oral     SpO2: 100% 99% 100% 100%  Weight:      Height:       Physical Exam HENT:     Head: Normocephalic.  Eyes:     General: Lids are normal.     Conjunctiva/sclera: Conjunctivae normal.  Cardiovascular:     Rate and Rhythm: Normal rate and regular rhythm.     Heart sounds: Normal heart sounds, S1 normal and S2 normal.  Pulmonary:     Breath sounds: Examination of the right-lower field reveals decreased breath sounds. Examination of the left-lower field reveals decreased breath sounds. Decreased breath sounds present. No wheezing, rhonchi or rales.  Abdominal:     Palpations: Abdomen is soft.     Tenderness: There is abdominal tenderness in the right upper quadrant and periumbilical area.  Musculoskeletal:     Right lower leg: No swelling.     Left lower leg: No swelling.     Comments: Left thumb swelling.  Skin:    General: Skin is warm.     Comments: Chest wall blisters popped.  Blister left thumb.  Neurological:     Mental Status: She is alert.     Data Reviewed: No acute findings to explain patient's abdominal pain, interval improvement of aeration of the right lung with partial reexpansion of the right lower lobe, prominent stool throughout the colon suggesting constipation, generalized soft tissue edema without evidence of ascites Hypoxia count 4.7, hemoglobin 8.6, platelet count 231, creatinine 0.73  Family Communication: Left message for [name]  Disposition: Status is: Inpatient Remains inpatient appropriate because: TOC working on a rehab bed  Planned Discharge Destination: Rehab    Time spent: 28 minutes  Author: Charlie Patterson, MD 12/17/2023 5:12 PM  For on call review [email or web address].

## 2023-12-17 NOTE — Assessment & Plan Note (Addendum)
 Patient intermittently complains of abdominal pain.  Prior imaging is negative.  Patient having bowel movements.  Taper pain medication.  Trial of Flexeril.

## 2023-12-18 DIAGNOSIS — A419 Sepsis, unspecified organism: Secondary | ICD-10-CM | POA: Diagnosis not present

## 2023-12-18 DIAGNOSIS — B20 Human immunodeficiency virus [HIV] disease: Secondary | ICD-10-CM | POA: Diagnosis not present

## 2023-12-18 DIAGNOSIS — J9601 Acute respiratory failure with hypoxia: Secondary | ICD-10-CM | POA: Diagnosis not present

## 2023-12-18 DIAGNOSIS — R1084 Generalized abdominal pain: Secondary | ICD-10-CM | POA: Diagnosis not present

## 2023-12-18 DIAGNOSIS — I9589 Other hypotension: Secondary | ICD-10-CM | POA: Diagnosis not present

## 2023-12-18 DIAGNOSIS — J189 Pneumonia, unspecified organism: Secondary | ICD-10-CM | POA: Diagnosis not present

## 2023-12-18 LAB — BASIC METABOLIC PANEL
Anion gap: 11 (ref 5–15)
BUN: 18 mg/dL (ref 6–20)
CO2: 23 mmol/L (ref 22–32)
Calcium: 8.9 mg/dL (ref 8.9–10.3)
Chloride: 111 mmol/L (ref 98–111)
Creatinine, Ser: 0.8 mg/dL (ref 0.44–1.00)
GFR, Estimated: >60 mL/min (ref >60)
Glucose, Bld: 86 mg/dL (ref 70–99)
Potassium: 4.5 mmol/L (ref 3.5–5.1)
Sodium: 145 mmol/L (ref 135–145)

## 2023-12-18 LAB — CBC
HCT: 29.8 % — ABNORMAL LOW (ref 36.0–46.0)
Hemoglobin: 9.1 g/dL — ABNORMAL LOW (ref 12.0–15.0)
MCH: 26.2 pg (ref 26.0–34.0)
MCHC: 30.5 g/dL (ref 30.0–36.0)
MCV: 85.9 fL (ref 80.0–100.0)
Platelets: 311 10*3/uL (ref 150–400)
RBC: 3.47 MIL/uL — ABNORMAL LOW (ref 3.87–5.11)
RDW: 15.4 % (ref 11.5–15.5)
WBC: 4.1 10*3/uL (ref 4.0–10.5)
nRBC: 0 % (ref 0.0–0.2)

## 2023-12-18 LAB — CORTISOL-AM, BLOOD: Cortisol - AM: 9 ug/dL (ref 6.7–22.6)

## 2023-12-18 MED ORDER — ADULT MULTIVITAMIN W/MINERALS CH
1.0000 | ORAL_TABLET | Freq: Every day | ORAL | Status: DC
  Administered 2023-12-19 – 2024-05-06 (×138): 1 via ORAL
  Filled 2023-12-18 (×140): qty 1

## 2023-12-18 MED ORDER — AZITHROMYCIN 600 MG PO TABS
1200.0000 mg | ORAL_TABLET | ORAL | Status: DC
  Administered 2023-12-19 – 2024-03-19 (×15): 1200 mg via ORAL
  Filled 2023-12-18 (×18): qty 2

## 2023-12-18 NOTE — Progress Notes (Signed)
 Nutrition Follow-up  DOCUMENTATION CODES:   Severe malnutrition in context of chronic illness  INTERVENTION:   -Continue Ensure Enlive po TID, each supplement provides 350 kcal and 20 grams of protein.  -Continue MVI with minerals daily -Continue 1 mg folic acid  daily -Continue 100 mg thiamine  daily -Magic cup TID with meals, each supplement provides 290 kcal and 9 grams of protein   NUTRITION DIAGNOSIS:   Severe Malnutrition related to chronic illness (HIV, homelessness, substance abuse) as evidenced by percent weight loss, severe fat depletion, severe muscle depletion.  Ongoing  GOAL:   Patient will meet greater than or equal to 90% of their needs  Progressing   MONITOR:   PO intake, Supplement acceptance, Labs, Weight trends, I & O's, Skin  REASON FOR ASSESSMENT:   Consult Enteral/tube feeding initiation and management  ASSESSMENT:   48 y/o female with h/o homelessness, HIV, substance abuse, Afib, mood disorder, hypothyroidism, type 2 diabetes mellitus and depression who is admitted with aspiration PNA.  12/31- s/p BSE- dysphagia 3 diet with thin liquids  Reviewed I/O's: +5.1 L since admission  Pt sitting up in bed at time of visit. She reports feeling better and eating well. Pt denies any difficulty with current diet consistency. She is eating most of her meals and drinking her Ensure. She denies any complaints and is requesting to eat something sweet. Discussed importance of good meal and supplement intake to promote healing.   Medications reviewed and include lovenox , folic acid , protamine, protonix , miralax , and thiamine .   Per TOC notes, likely plan for SNF placement at time of visit.   Labs reviewed: CBGS: 103-111 (inpatient orders for glycemic control are none).    Diet Order:   Diet Order             DIET DYS 3 Room service appropriate? Yes; Fluid consistency: Thin  Diet effective now                   EDUCATION NEEDS:   Not appropriate  for education at this time  Skin:  Skin Assessment: Reviewed RN Assessment  Last BM:  12/18/23 (type 3)  Height:   Ht Readings from Last 1 Encounters:  12/09/23 5' 7.99 (1.727 m)    Weight:   Wt Readings from Last 1 Encounters:  12/18/23 52.3 kg    Ideal Body Weight:  63.6 kg  BMI:  Body mass index is 17.54 kg/m.  Estimated Nutritional Needs:   Kcal:  1800-2100kcal/day  Protein:  90-105g/day  Fluid:  1.8-2.1L/day    Meghan Jarvis ORN, RD, LDN, CDCES Registered Dietitian III Certified Diabetes Care and Education Specialist If unable to reach this RD, please use RD Inpatient group chat on secure chat between hours of 8am-4 pm daily

## 2023-12-18 NOTE — Plan of Care (Signed)

## 2023-12-18 NOTE — Progress Notes (Signed)
 Progress Note   Patient: Meghan Jarvis FMW:[id number] DOB: 07/01/1976 DOA: 12/08/2023     10 DOS: the patient was seen and examined on 12/18/2023   Brief hospital course: 48 year old female past medical history of alcohol abuse, cocaine abuse, homelessness, AIDS, atrial fibrillation, hypothyroidism, type 2 diabetes mellitus presented to [facility] regional with acute respiratory failure.  She was found down by a friend and could not wake her up.  She was found to be hypoxic in the 70s.  Since she was obtunded she was intubated and admitted by the critical care service.  He was started on antibiotics for pneumonia.  Urine toxicology positive for cocaine.  Patient extubated on 12/30.  12/12/2023.  Patient feeling okay.  Spoke with patient's friend [name] on the phone.  The patient is dating her son. 1/2.  Patient complained of chest pain.  Cardiac enzymes negative x 2 and EKG did not show any ST elevation. 1/3.  Patient complaining of abdominal pain.  Will get right upper quadrant ultrasound negative.  MRI of the brain did not show any toxoplasmosis lesions but did show advanced cerebral atrophy. 1/4.  Patient complaining of headache.  Will give IV magnesium .  Patient walked 3 feet with physical therapy. 1/5.  Blood pressure this morning was low and 2 fluid boluses given.  Started on midodrine . 1/6.  Patient complaining of a lot of abdominal pain.  CT scan does not show any acute process and shows better aeration of right lower lung.  CT did show constipation.  Will start MiraLAX . 1/7.  Patient feeling good today.  Did not complain of any pain.  Hemoglobin 9.1, platelet count up at 311, white blood count 4.1  Assessment and Plan: * Abdominal pain Resolved.  Likely related to constipation.  Continue MiraLAX .  Earlier in the hospital course had right upper quadrant pain with an ultrasound of the right upper quadrant was negative.  CT scan showing constipation.  Hypotension Continue  midodrine   Severe sepsis (HCC) Present on admission with tachycardia, tachypnea, fever, pneumonia, acute respiratory failure and acute metabolic encephalopathy.  Patient completed antibiotics.  Acute hypoxic respiratory failure (HCC) Resolved.  Patient extubated on 12/30.  Patient currently breathing on room air.  Pneumonia of right lower lobe due to infectious organism Completed 5 days of antibiotic.  AIDS (HCC) Viral load 2 million, CD4 14.  Infectious disease following.  Placed on PCP and MAI prophylaxis with Bactrim  and Zithromax .  Toxoplasma antibody IgG came back elevated and MRI of the brain does not show any enhancing lesions.  Infectious disease specialist started on Biktarvy .  Acute metabolic encephalopathy Could be secondary to pneumonia and/or hypoxia from acute respiratory failure.  Mental status improved from admission.  MRI did not show any enhancing lesions but did show severe cerebral atrophy.  Headache Did not complain of headache today.  IV magnesium  earlier in hospital course.  Cryptococcus antigen negative.  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  Platelet platelet count up in the normal range at 311, white blood cell count up in the normal range at 4.1, hemoglobin 9.1.  Hemoglobin likely drifted down with fluid boluses.  Protein-calorie malnutrition, severe Continue supplements  Cocaine abuse (HCC) Must stop cocaine  Alcohol abuse Continue thiamine  multivitamin and folic acid   Uncontrolled type 2 diabetes mellitus with hypoglycemia, without long-term current use of insulin  (HCC) Hemoglobin A1c 6.0.   Underweight (BMI < 18.5) Last BMI 17.54  Diarrhea Likely overflow constipation causing her diarrhea.  Generalized weakness Physical therapy recommending rehab  Chest  pain Atypical in nature.  Troponin x 2 negative.  EKG did not show any ST elevations.        Subjective: Patient feels good today.  Offers no complaints.  Walked 40 feet yesterday.   Initially admitted secondary to respiratory distress and was intubated and treated for pneumonia.  Physical Exam: Vitals:   12/18/23 0437 12/18/23 0500 12/18/23 0751 12/18/23 1638  BP: 109/68  104/71 96/69  Pulse: 79  63 72  Resp: 18  16 18   Temp: 97.9 F (36.6 C)  98.2 F (36.8 C) 98.2 F (36.8 C)  TempSrc:      SpO2: 99%  100% 100%  Weight:  52.3 kg    Height:       Physical Exam HENT:     Head: Normocephalic.  Eyes:     General: Lids are normal.     Conjunctiva/sclera: Conjunctivae normal.  Cardiovascular:     Rate and Rhythm: Normal rate and regular rhythm.     Heart sounds: Normal heart sounds, S1 normal and S2 normal.  Pulmonary:     Breath sounds: Examination of the right-lower field reveals decreased breath sounds. Examination of the left-lower field reveals decreased breath sounds. Decreased breath sounds present. No wheezing, rhonchi or rales.  Abdominal:     Palpations: Abdomen is soft.     Tenderness: There is no abdominal tenderness.  Musculoskeletal:     Right lower leg: No swelling.     Left lower leg: No swelling.     Comments: Left thumb swelling.  Skin:    General: Skin is warm.     Comments: Chest wall blisters popped.  Blister left thumb.  Neurological:     Mental Status: She is alert.     Data Reviewed: Creatinine 0.8, white blood cell count 4.1, hemoglobin 9.1, platelet count 311 CT scan of the abdomen did not show any acute abnormality did show constipation and improvement of aeration of right lung.  Family Communication: Left message for friend.  Disposition: Status is: Inpatient Remains inpatient appropriate because: Physical therapy recommending rehab  Planned Discharge Destination: Rehab    Time spent: 28 minutes  Author: Charlie Patterson, MD 12/18/2023 5:09 PM  For on call review [email or web address].

## 2023-12-18 NOTE — Progress Notes (Signed)
 Date of Admission:  12/08/2023     ID: Meghan Jarvis is a 48 y.o. female  Principal Problem:   Abdominal pain Active Problems:   Alcohol abuse   Acute hypoxic respiratory failure (HCC)   Pneumonia of right lower lobe due to infectious organism   Protein-calorie malnutrition, severe   Acute metabolic encephalopathy   Uncontrolled type 2 diabetes mellitus with hypoglycemia, without long-term current use of insulin  (HCC)   Cocaine abuse (HCC)   AIDS (HCC)   Severe sepsis (HCC)   Chest pain   Pancytopenia (HCC)   Generalized weakness   Diarrhea   Right upper quadrant abdominal pain   Headache   Underweight (BMI < 18.5)   Hypotension  ? Meghan Jarvis is a 48 y.o. with a history of AIDS, not compliant with meds or visits ,  , cocaine use was brought in by EMS after being found unresponsive on the couch  in an aquaintance place-   Subjective: C/o rt sided pain below the ribs  Medications:   [START ON 12/19/2023] azithromycin   1,200 mg Oral Weekly   bictegravir-emtricitabine -tenofovir  AF  1 tablet Oral Daily   enoxaparin  (LOVENOX ) injection  40 mg Subcutaneous QHS   feeding supplement  237 mL Oral TID BM   folic acid   1 mg Oral Daily   midodrine   10 mg Oral TID WC   [START ON 12/19/2023] multivitamin with minerals  1 tablet Oral QHS   pantoprazole   40 mg Oral BID   polyethylene glycol  17 g Oral Daily   sulfamethoxazole -trimethoprim   1 tablet Oral Daily   thiamine   100 mg Oral Daily    Objective: Vital signs in last 24 hours: Patient Vitals for the past 24 hrs:  BP Temp Pulse Resp SpO2 Weight  12/18/23 2114 95/65 98.1 F (36.7 C) 74 -- 100 % --  12/18/23 1638 96/69 98.2 F (36.8 C) 72 18 100 % --  12/18/23 0751 104/71 98.2 F (36.8 C) 63 16 100 % --  12/18/23 0500 -- -- -- -- -- 52.3 kg  12/18/23 0437 109/68 97.9 F (36.6 C) 79 18 99 % --  12/17/23 2336 96/71 97.7 F (36.5 C) 64 18 100 % --    Awake and alert Chest b/l air entry Hss1s2 Abd soft Tenderness  over the skin rt upper quadrant  Lab Results    Latest Ref Rng & Units 12/18/2023    8:30 AM 12/16/2023   12:29 PM 12/14/2023    4:08 AM  CBC  WBC 4.0 - 10.5 K/uL 4.1  4.7  3.2   Hemoglobin 12.0 - 15.0 g/dL 9.1  8.6  9.3   Hematocrit 36.0 - 46.0 % 29.8  27.4  30.1   Platelets 150 - 400 K/uL 311  231  165        Latest Ref Rng & Units 12/18/2023    7:16 AM 12/16/2023   12:29 PM 12/14/2023    4:08 AM  CMP  Glucose 70 - 99 mg/dL 86  873  893   BUN 6 - 20 mg/dL 18  18  13    Creatinine 0.44 - 1.00 mg/dL 9.19  9.26  9.22   Sodium 135 - 145 mmol/L 145  144  140   Potassium 3.5 - 5.1 mmol/L 4.5  3.9  3.5   Chloride 98 - 111 mmol/L 111  112  108   CO2 22 - 32 mmol/L 23  23  25    Calcium  8.9 - 10.3 mg/dL  8.9  8.2  8.5   Total Protein 6.5 - 8.1 g/dL  6.8    Total Bilirubin 0.0 - 1.2 mg/dL  0.3    Alkaline Phos 38 - 126 U/L  38    AST 15 - 41 U/L  43    ALT 0 - 44 U/L  33        Microbiology: 12/28 BC NG  Toxo IgG > 400 Toxo PCR neg igM neg RPR NR CMV DNA neg Crypto neg HIV RNA 2 million Cd4 is 14 ( 2.4%)     Assessment/Plan: Unresponsive /AMS secondary to cocaine use- resolved   Aspiration leading to acute hypoxic resp failure rt Middle lobe and lower lobe consolidation-- was intubated and mechanically ventilated in the beginning. Now extubated Was On zosyn  Completed rx   H/o fall with recurrent ED presentation- had hurt left shoulder before   Left thumb tender swelling- Xray hand no obvious fracture   Pruritus with clear blisters- D.D  Cocaine use Insect bite Scabies- no burrows R/o primary dermatological condition like pemphigus ( less likely)  Syphilis- RPR neg   AIDS- non compliant with meds or visits to her Provider- not been in care since 2021- used to be followed at [facility] before Was on Biktarvy  at one time and was non compliant then VL now is 2 million and cd4 is 11  Started Biktarvy  on 12/14/23 . not sure how much compliance to be expected if her social situation  is not conducive and so is her substance use- watch closely for Immune reconstitution inflammatory syndrome   Toxo IgG very high-  MRI brain no CNS lesions  . TOXO PCR negative On  PCP and MAI prophylaxis. Bactrim  will do Toxo prophylaxis as well       Anemia   leucopenia/thrombocytopenia could be due to AIDS ETOH abuse   improving  CMV DNA neg , AFB blood culture sent    ETOH abuse   Rt upper quadrant pain CT abdomen done for pain abdomen stool throughout colon No zoster Could this be referred pain from Spine- may need to check MRI thoracic spine     Follow strict universal precautions when taking care of patient   Discussed the management with patient

## 2023-12-19 DIAGNOSIS — B20 Human immunodeficiency virus [HIV] disease: Secondary | ICD-10-CM | POA: Diagnosis not present

## 2023-12-19 DIAGNOSIS — G9341 Metabolic encephalopathy: Secondary | ICD-10-CM | POA: Diagnosis not present

## 2023-12-19 DIAGNOSIS — F1410 Cocaine abuse, uncomplicated: Secondary | ICD-10-CM | POA: Diagnosis not present

## 2023-12-19 DIAGNOSIS — J189 Pneumonia, unspecified organism: Secondary | ICD-10-CM | POA: Diagnosis not present

## 2023-12-19 LAB — FUNGITELL BETA-D-GLUCAN
Fungitell Value:: >500 pg/mL
Result Name:: POSITIVE — AB

## 2023-12-19 MED ORDER — DULOXETINE HCL 30 MG PO CPEP
30.0000 mg | ORAL_CAPSULE | Freq: Every day | ORAL | Status: DC
  Administered 2023-12-19 – 2024-04-03 (×104): 30 mg via ORAL
  Filled 2023-12-19 (×106): qty 1

## 2023-12-19 NOTE — Progress Notes (Addendum)
 Date of Admission:  12/08/2023     ID: Meghan Jarvis is a 48 y.o. female  Principal Problem:   Abdominal pain Active Problems:   Alcohol abuse   Acute hypoxic respiratory failure (HCC)   Pneumonia of right lower lobe due to infectious organism   Protein-calorie malnutrition, severe   Acute metabolic encephalopathy   Uncontrolled type 2 diabetes mellitus with hypoglycemia, without long-term current use of insulin  (HCC)   Cocaine abuse (HCC)   AIDS (HCC)   Severe sepsis (HCC)   Chest pain   Pancytopenia (HCC)   Generalized weakness   Diarrhea   Right upper quadrant abdominal pain   Headache   Underweight (BMI < 18.5)   Hypotension  ? Meghan Jarvis is a 48 y.o. with a history of AIDS, not compliant with meds or visits ,  , cocaine use was brought in by EMS after being found unresponsive on the couch  in an aquaintance place-   Subjective: Pt walked to the bathroom with help of nurse Not steady on her feet  Medications:   azithromycin   1,200 mg Oral Weekly   bictegravir-emtricitabine -tenofovir  AF  1 tablet Oral Daily   enoxaparin  (LOVENOX ) injection  40 mg Subcutaneous QHS   feeding supplement  237 mL Oral TID BM   folic acid   1 mg Oral Daily   midodrine   10 mg Oral TID WC   multivitamin with minerals  1 tablet Oral QHS   pantoprazole   40 mg Oral BID   polyethylene glycol  17 g Oral Daily   sulfamethoxazole -trimethoprim   1 tablet Oral Daily   thiamine   100 mg Oral Daily    Objective: Vital signs in last 24 hours: Patient Vitals for the past 24 hrs:  BP Temp Pulse Resp SpO2 Weight  12/19/23 0758 91/62 (!) 97.5 F (36.4 C) 71 18 100 % --  12/19/23 0533 (!) 84/64 -- 71 -- -- 55.1 kg  12/19/23 0532 (!) 84/65 97.6 F (36.4 C) 67 17 100 % --  12/19/23 0529 90/67 -- 71 18 100 % --  12/18/23 2114 95/65 98.1 F (36.7 C) 74 -- 100 % --  12/18/23 1638 96/69 98.2 F (36.8 C) 72 18 100 % --    Awake and alert Some cognitive impairement Chest b/l air  entry Hss1s2 CNS non focal   Lab Results    Latest Ref Rng & Units 12/18/2023    8:30 AM 12/16/2023   12:29 PM 12/14/2023    4:08 AM  CBC  WBC 4.0 - 10.5 K/uL 4.1  4.7  3.2   Hemoglobin 12.0 - 15.0 g/dL 9.1  8.6  9.3   Hematocrit 36.0 - 46.0 % 29.8  27.4  30.1   Platelets 150 - 400 K/uL 311  231  165        Latest Ref Rng & Units 12/18/2023    7:16 AM 12/16/2023   12:29 PM 12/14/2023    4:08 AM  CMP  Glucose 70 - 99 mg/dL 86  873  893   BUN 6 - 20 mg/dL 18  18  13    Creatinine 0.44 - 1.00 mg/dL 9.19  9.26  9.22   Sodium 135 - 145 mmol/L 145  144  140   Potassium 3.5 - 5.1 mmol/L 4.5  3.9  3.5   Chloride 98 - 111 mmol/L 111  112  108   CO2 22 - 32 mmol/L 23  23  25    Calcium  8.9 - 10.3 mg/dL 8.9  8.2  8.5   Total Protein 6.5 - 8.1 g/dL  6.8    Total Bilirubin 0.0 - 1.2 mg/dL  0.3    Alkaline Phos 38 - 126 U/L  38    AST 15 - 41 U/L  43    ALT 0 - 44 U/L  33        Microbiology: 12/28 BC NG  Toxo IgG > 400 Toxo PCR neg igM neg RPR NR CMV DNA neg Crypto neg HIV RNA 2 million Cd4 is 14 ( 2.4%)     Assessment/Plan: Unresponsive /AMS secondary to cocaine use- resolved   Aspiration leading to acute hypoxic resp failure rt Middle lobe and lower lobe consolidation-- was intubated and mechanically ventilated in the beginning. 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 not sure how much  compliance to be expected if her social situation is not conducive and so is her substance use- watch closely for Immune reconstitution inflammatory syndrome   Toxo IgG very high-  MRI brain no CNS lesions  . TOXO PCR negative On  PCP and MAI prophylaxis. 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

## 2023-12-19 NOTE — Progress Notes (Addendum)
 Occupational Therapy Treatment Patient Details Name: Meghan Jarvis MRN: [id number] DOB: 1976-03-03 Today's Date: 12/19/2023   History of present illness Pt is a 48 year old female admitted with [facility] with acute hypoxic respiratory failure, severe sepsis, community acquired PNA acute metabolic encephalopathy; She was intubated, extubated 12/30    PMH significant for alcohol abuse, cocaine abuse, homelessness, AIDS, atrial fibrillation, hypothyroidism, type 2 diabetes mellitus   OT comments  Pt making progress towards goals, remains limited by decreased cognition and decreased insight into deficits. Pt found eating lunch in bed, linens soiled with food and pt seemingly unconcerned. Pt observed to pick up ice cream container from floor, and used fingers to spoon into mouth - OT redirects. Transfers to [facility] with RW and minA, pericare minA and stands at sink to wash hands, no LOB noted but requires cues for thoroughness. OT will continue to follow for functional gains. Discharge recommendation remains appropriate.  Pt will require 24/7 supervision + assist for safety at discharge.       If plan is discharge home, recommend the following:  A lot of help with walking and/or transfers;A lot of help with bathing/dressing/bathroom;Assistance with cooking/housework;Assistance with feeding;Direct supervision/assist for medications management;Direct supervision/assist for financial management;Assist for transportation;Help with stairs or ramp for entrance;Supervision due to cognitive status   Equipment Recommendations  Other (comment)       Precautions / Restrictions Precautions Precautions: Fall Restrictions Weight Bearing Restrictions Per Provider Order: No       Mobility Bed Mobility Overal bed mobility: Needs Assistance Bed Mobility: Supine to Sit     Supine to sit: Contact guard Sit to supine: Contact guard assist        Transfers Overall transfer level: Needs assistance Equipment used:  Rolling walker (2 wheels) Transfers: Sit to/from Stand, Bed to chair/wheelchair/BSC Sit to Stand: Min assist     Step pivot transfers: Min assist           Balance Overall balance assessment: Needs assistance Sitting-balance support: Bilateral upper extremity supported Sitting balance-Leahy Scale: Fair     Standing balance support: During functional activity, No upper extremity supported Standing balance-Leahy Scale: Poor Standing balance comment: stands at sink with no LOB to wash hands                           ADL either performed or assessed with clinical judgement   ADL Overall ADL's : Needs assistance/impaired Eating/Feeding: Sitting Eating/Feeding Details (indicate cue type and reason): pt found eating lunch, with linens soiled with half eaten food, food on pts gown and face, and pt seemingly unconcerned Grooming: Wash/dry hands;Standing;Cueing for sequencing Grooming Details (indicate cue type and reason): requires cues for throughlness                 Toilet Transfer: BSC/3in1;Rolling walker (2 wheels);Cueing for safety;Cueing for sequencing;Minimal assistance Toilet Transfer Details (indicate cue type and reason): cues for hand placement, safety awareness as items were on floor (spoon, ice cream cup, etc etc) Toileting- Clothing Manipulation and Hygiene: Minimal assistance Toileting - Clothing Manipulation Details (indicate cue type and reason): minA for pericare in standing     Functional mobility during ADLs: Minimal assistance;Contact guard assist;Rolling walker (2 wheels);Cueing for safety General ADL Comments: Pt limited by decreased cognition, balance and tolerance to activity. Easily distracted internally and externally.      Cognition Arousal: Alert Behavior During Therapy: Flat affect Overall Cognitive Status: No family/caregiver present to determine baseline  cognitive functioning Area of Impairment: Orientation, Attention, Memory,  Following commands, Safety/judgement, Awareness, Problem solving                 Orientation Level: Disoriented to, Situation Current Attention Level: Focused Memory: Decreased short-term memory, Decreased recall of precautions Following Commands: Follows one step commands inconsistently Safety/Judgement: Decreased awareness of safety, Decreased awareness of deficits Awareness: Intellectual Problem Solving: Slow processing, Decreased initiation, Difficulty sequencing, Requires verbal cues, Requires tactile cues                     Pertinent Vitals/ Pain       Pain Assessment Pain Assessment: 0-10 Pain Score: 8  Pain Location: R sided flank pain Pain Descriptors / Indicators: Aching, Grimacing, Guarding Pain Intervention(s): Limited activity within patient's tolerance, Monitored during session   Frequency  Min 1X/week        Progress Toward Goals  OT Goals(current goals can now be found in the care plan section)  Progress towards OT goals: Progressing toward goals  Acute Rehab OT Goals OT Goal Formulation: With patient Time For Goal Achievement: 12/26/23 Potential to Achieve Goals: Fair  Plan         AM-PAC OT 6 Clicks Daily Activity     Outcome Measure   Help from another person eating meals?: None Help from another person taking care of personal grooming?: A Little Help from another person toileting, which includes using toliet, bedpan, or urinal?: A Little Help from another person bathing (including washing, rinsing, drying)?: A Little Help from another person to put on and taking off regular upper body clothing?: A Little Help from another person to put on and taking off regular lower body clothing?: A Little 6 Click Score: 19    End of Session Equipment Utilized During Treatment: Rolling walker (2 wheels);Gait belt  OT Visit Diagnosis: Other abnormalities of gait and mobility (R26.89);Muscle weakness (generalized) (M62.81)   Activity Tolerance  Patient tolerated treatment well   Patient Left in bed;with call bell/phone within reach;with bed alarm set;with nursing/sitter in room   Nurse Communication Mobility status        Time: 8650-8591 OT Time Calculation (min): 19 min  Charges: OT General Charges $OT Visit: 1 Visit OT Treatments $Self Care/Home Management : 8-22 mins  Meghan Jarvis, OTR/L  12/19/23, 3:03 PM

## 2023-12-19 NOTE — Progress Notes (Signed)
 Nutrition Follow-up  DOCUMENTATION CODES:   Severe malnutrition in context of chronic illness  INTERVENTION:   -Continue Ensure Enlive po TID, each supplement provides 350 kcal and 20 grams of protein.  -Continue MVI with minerals daily -Continue 1 mg folic acid  daily -Continue 100 mg thiamine  daily -Continue Magic cup TID with meals, each supplement provides 290 kcal and 9 grams of protein   NUTRITION DIAGNOSIS:   Severe Malnutrition related to chronic illness (HIV, homelessness, substance abuse) as evidenced by percent weight loss, severe fat depletion, severe muscle depletion.  Ongoing  GOAL:   Patient will meet greater than or equal to 90% of their needs  Progressing   MONITOR:   PO intake, Supplement acceptance, Labs, Weight trends, I & O's, Skin  REASON FOR ASSESSMENT:   Consult Enteral/tube feeding initiation and management  ASSESSMENT:   48 y/o female with h/o homelessness, HIV, substance abuse, Afib, mood disorder, hypothyroidism, type 2 diabetes mellitus and depression who is admitted with aspiration PNA.  12/31- s/p BSE- dysphagia 3 diet with thin liquids   Reviewed I/O's: +5.1 L since admission   Pt remains on a dysphagia 3 diet. Pt reports tolerating diet and consistency well. Noted meal completions 20%. Pt claims that she is eating most of her meals. Noted pt had consumed some graham crackers and ice cream. Per her report, she is drinking Ensure whenever she gets them. Noted Magic Cup on tray able, which pt reports she is also interested in trying as she likes sweets. She denies any additional complaints.   Per TOC notes, likely plan for SNF placement at discharge.   Medications reviewed and include biktarvy , lovenox , folic acid , protamine, miralax , and thiamine .   Labs reviewed: CBGS: 103-111 (inpatient orders for glycemic control are none).    Diet Order:   Diet Order             DIET DYS 3 Room service appropriate? Yes; Fluid consistency: Thin   Diet effective now                   EDUCATION NEEDS:   Not appropriate for education at this time  Skin:  Skin Assessment: Reviewed RN Assessment  Last BM:  12/18/23 (type 3)  Height:   Ht Readings from Last 1 Encounters:  12/09/23 5' 7.99 (1.727 m)    Weight:   Wt Readings from Last 1 Encounters:  12/19/23 55.1 kg    Ideal Body Weight:  63.6 kg  BMI:  Body mass index is 18.47 kg/m.  Estimated Nutritional Needs:   Kcal:  1800-2100kcal/day  Protein:  90-105g/day  Fluid:  1.8-2.1L/day    Margery ORN, RD, LDN, CDCES Registered Dietitian III Certified Diabetes Care and Education Specialist If unable to reach this RD, please use RD Inpatient group chat on secure chat between hours of 8am-4 pm daily

## 2023-12-19 NOTE — Progress Notes (Signed)
 Spoke with Domenick Gong, Acute Transition Care Management RN with [facility]. She is assigned to patient and will be enrolling her in services. Barrier to care is currently lack of phone. Patient reports she is attempting to "get phone."

## 2023-12-19 NOTE — Progress Notes (Signed)
 PROGRESS NOTE    Meghan Jarvis  FMW:[id number] DOB: May 06, 1976 DOA: 12/08/2023 [name]  Chief Complaint  Patient presents with   Altered Mental Status    Hospital Course:  Meghan Jarvis is 48 y.o. female with history of alcohol abuse, cocaine abuse, homelessness, HIV/AIDS, atrial fibrillation, hypothyroidism, type 2 diabetes, who presented to [facility] with acute respiratory failure.  Reportedly she was found down by a friend who would not wake up.  She was hypoxic in the 70s on arrival. She was intubated and admitted by the critical care service.  She was started on antibiotics for presumed aspiration pneumonia.  Urine tox positive for cocaine.  Patient was successfully extubated on 12/30 and transferred to [facility] on 1/1.  Patient's stay has then been prolonged pending rehab placement and complaints of various pain.  Did complain of chest pain but cardiac enzymes were negative x 2 and EKG did not reveal any ST elevations.  She also complained of abdominal pain, right upper quadrant ultrasound negative.  She complained of a headache, MRI brain negative for any toxoplasmosis lesions but did show advanced cerebral atrophy.  L1/5 patient had some hypotension which was responsive to 2 fluid boluses and midodrine .  Patient continued to complain of abdominal pain on 1/6 that she underwent CT scan which was negative for acute process and in fact revealed better aeration of right lower lung.  It did reveal some constipation so she was started on bowel regimen.  Subjective: Evaluation this morning patient is agitated.  She is complaining of right shoulder pain.  She cannot report when this began or it was provoked by trauma. She also reports that the pain is underneath her right breast   Objective: Vitals:   12/19/23 0529 12/19/23 0532 12/19/23 0533 12/19/23 0758  BP: 90/67 (!) 84/65 (!) 84/64 91/62  Pulse: 71 67 71 71  Resp: 18 17  18   Temp:  97.6 F (36.4 C)  (!) 97.5 F (36.4  C)  TempSrc:      SpO2: 100% 100%  100%  Weight:   55.1 kg   Height:       No intake or output data in the 24 hours ending 12/19/23 0803 Filed Weights   12/16/23 0513 12/18/23 0500 12/19/23 0533  Weight: 53.2 kg 52.3 kg 55.1 kg    Examination: General exam: Appears calm and comfortable, easily agitated Respiratory system: No work of breathing, symmetric chest wall expansion Cardiovascular system: S1 & S2 heard, RRR.  Gastrointestinal system: Abdomen is nondistended, soft and nontender.  Neuro: Alert and oriented. No focal neurological deficits. Extremities: Symmetric, expected ROM, R shoulder exam limited by patient who will not allow Skin: No rashes, lesions Psychiatry: agitated but redirectable  Assessment & Plan:  Principal Problem:   Abdominal pain Active Problems:   Hypotension   Acute hypoxic respiratory failure (HCC)   Severe sepsis (HCC)   Pneumonia of right lower lobe due to infectious organism   Acute metabolic encephalopathy   AIDS (HCC)   Protein-calorie malnutrition, severe   Pancytopenia (HCC)   Headache   Cocaine abuse (HCC)   Right upper quadrant abdominal pain   Alcohol abuse   Uncontrolled type 2 diabetes mellitus with hypoglycemia, without long-term current use of insulin  (HCC)   Chest pain   Generalized weakness   Diarrhea   Underweight (BMI < 18.5)  Acute hypoxic respiratory failure - Secondary to drug overdose and aspiration pneumonia - Initially managed in ICU on ventilator - Successfully extubated  12/30 - Now on room air - Has completed antibiotic course - Monitor respiratory status closely  Hypotension - Continue midodrine , MAP goal above 65  Severe sepsis - On admission: Tachycardia, tachypnea, fever, pneumonia. - Secondary to aspiration pneumonia as above - Status post antibiotics  Acute metabolic encephalopathy - Secondary to severe sepsis as above - MRI negative for acute pathology, does show severe cerebral atrophy -  Appears to have improved from admission - Continue frequent reorientation  Aspiration pneumonia - Status post 5 days antibiotics as above  AIDS HIV - Viral load 2 million, CD4: 14 - ID consulted - Currently on PCP and MAI prophylaxis with Bactrim  and Zithromax  - Toxoplasma IgG came back elevated, MRI of the brain does not show any enhancing lesions - Patient has been initiated on Biktarvy  - Will need close outpatient follow-up  Headache - Resolved - Required IV magnesium  earlier in hospital course - Cryptococcus antigen negative  Pancytopenia - Secondary to HIV as above - Platelet count now within normal range - Continue to trend CBC  Protein calorie malnutrition, severe BMI under 18 - Continue supplements - Nutrition consult  Cocaine abuse - Has been counseled on cessation  Alcohol abuse - Now out of withdrawal window - Continue with thiamine , multivitamin, folic acid  supplementation  Controlled type 2 diabetes with hyperglycemia, without long-term use of insulin  - Hemoglobin A1c 6.0%  Abdominal pain - Resolved - Continue bowel regimen - Right upper quadrant ultrasound and CT scan negative except for constipation.  Constipation - Continue bowel regimen as above  Generalized weakness Frequent falls - Secondary to all above conditions - PT/OT - PT recommending rehab, TOC consulted for placement  Chest pain - Atypical - Troponin x 2 negative, EKG without ST elevations  Chronic pain - Patient complains of pain all over.  Transfer acute pathology as above - Will initiate Cymbalta  for chronic pain management.  Start at 30 mg daily and titrate up as patient tolerates      DVT prophylaxis: Lovenox    Code Status: Full Code Family Communication: none at bedside Disposition:  Status is: Inpatient, pending rehab placement    Consultants:  Infectious disease Procedures:  N/a  Antimicrobials:  Anti-infectives (From admission, onward)    Start      Dose/Rate Route Frequency Ordered Stop   12/19/23 1400  azithromycin  (ZITHROMAX ) tablet 1,200 mg        1,200 mg Oral Weekly 12/18/23 1346     12/18/23 1000  sulfamethoxazole -trimethoprim  (BACTRIM  DS) 800-160 MG per tablet 1 tablet        1 tablet Oral Daily 12/17/23 1621     12/14/23 1530  bictegravir-emtricitabine -tenofovir  AF (BIKTARVY ) 50-200-25 MG per tablet 1 tablet        1 tablet Oral Daily 12/14/23 1442     12/12/23 1000  amoxicillin -clavulanate (AUGMENTIN ) 875-125 MG per tablet 1 tablet        1 tablet Oral Every 12 hours 12/11/23 1345 12/12/23 2131   12/12/23 1000  azithromycin  (ZITHROMAX ) tablet 1,250 mg  Status:  Discontinued        1,200 mg Oral Weekly 12/11/23 1618 12/18/23 1346   12/11/23 2000  sulfamethoxazole -trimethoprim  (BACTRIM ) 400-80 MG per tablet 1 tablet  Status:  Discontinued        1 tablet Oral Daily 12/11/23 1618 12/17/23 1621   12/09/23 2200  azithromycin  (ZITHROMAX ) 500 mg in sodium chloride  0.9 % 250 mL IVPB  Status:  Discontinued        500 mg 250 mL/hr  over 60 Minutes Intravenous Every 24 hours 12/09/23 0734 12/11/23 1618   12/09/23 2000  cefTRIAXone  (ROCEPHIN ) 2 g in sodium chloride  0.9 % 100 mL IVPB  Status:  Discontinued        2 g 200 mL/hr over 30 Minutes Intravenous Every 24 hours 12/09/23 0953 12/09/23 1840   12/09/23 2000  piperacillin -tazobactam (ZOSYN ) IVPB 3.375 g        3.375 g 12.5 mL/hr over 240 Minutes Intravenous Every 8 hours 12/09/23 1850 12/12/23 0037   12/09/23 0600  Ampicillin -Sulbactam (UNASYN ) 3 g in sodium chloride  0.9 % 100 mL IVPB  Status:  Discontinued        3 g 200 mL/hr over 30 Minutes Intravenous Every 6 hours 12/09/23 0442 12/09/23 0953   12/08/23 2215  cefTRIAXone  (ROCEPHIN ) 2 g in sodium chloride  0.9 % 100 mL IVPB        2 g 200 mL/hr over 30 Minutes Intravenous Once 12/08/23 2214 12/08/23 2308   12/08/23 2215  azithromycin  (ZITHROMAX ) 500 mg in sodium chloride  0.9 % 250 mL IVPB        500 mg 250 mL/hr over 60 Minutes  Intravenous  Once 12/08/23 2214 12/09/23 0000       Data Reviewed: I have personally reviewed following labs and imaging studies CBC: Recent Labs  Lab 12/14/23 0408 12/16/23 1229 12/18/23 0830  WBC 3.2* 4.7 4.1  HGB 9.3* 8.6* 9.1*  HCT 30.1* 27.4* 29.8*  MCV 87.0 86.2 85.9  PLT 165 231 311   Basic Metabolic Panel: Recent Labs  Lab 12/13/23 0418 12/14/23 0408 12/16/23 1229 12/18/23 0716  NA  --  140 144 145  K  --  3.5 3.9 4.5  CL  --  108 112* 111  CO2  --  25 23 23   GLUCOSE  --  106* 126* 86  BUN  --  13 18 18   CREATININE  --  0.77 0.73 0.80  CALCIUM   --  8.5* 8.2* 8.9  MG 2.2  --   --   --   PHOS 2.9  --   --   --    GFR: Estimated Creatinine Clearance: 74 mL/min (by C-G formula based on SCr of 0.8 mg/dL). Liver Function Tests: Recent Labs  Lab 12/16/23 1229  AST 43*  ALT 33  ALKPHOS 38  BILITOT 0.3  PROT 6.8  ALBUMIN 2.5*   CBG: Recent Labs  Lab 12/14/23 0740 12/14/23 1206 12/14/23 1602 12/14/23 2041 12/16/23 2053  GLUCAP 92 128* 85 111* 103*    Recent Results (from the past 240 hours)  Culture, Respiratory w Gram Stain     Status: None   Collection Time: 12/09/23  3:34 PM   Specimen: Bronchoalveolar Lavage; Respiratory  Result Value Ref Range Status   Specimen Description   Final    BRONCHIAL ALVEOLAR LAVAGE Performed at [facility], [address] [place], [place] [address]    Special Requests   Final    NONE Performed at [facility], [address] [place], [place] [address]    Gram Stain   Final    NO SQUAMOUS EPITHELIAL CELLS SEEN RARE WBC PRESENT, PREDOMINANTLY PMN RARE GRAM NEGATIVE RODS RARE GRAM POSITIVE COCCI    Culture   Final    Normal respiratory flora-no Staph aureus or Pseudomonas seen Performed at [facility], [address], [place], [place] [address]    Report Status 12/12/2023 FINAL  Final  Fungus Culture With Stain  Status: None (Preliminary result)   Collection Time: 12/09/23   3:34 PM   Specimen: Bronchial Alveolar Lavage; Respiratory  Result Value Ref Range Status   Fungus Stain Final report  Final    Comment: (NOTE) Performed At: [facility] [address] [place], [place] [id number] Jennette Shorter MD Ey:[id number]    Fungus (Mycology) Culture PENDING  Incomplete   Fungal Source BRONCHIAL ALVEOLAR LAVAGE  Final    Comment: Performed at [facility], [address] [place], [place] [address]  Fungus Culture Result     Status: None   Collection Time: 12/09/23  3:34 PM  Result Value Ref Range Status   Result 1 Comment  Final    Comment: (NOTE) KOH/Calcofluor preparation:  no fungus observed. Performed At: [facility] [address] [place], [place] [id number] Jennette Shorter MD Ey:[id number]   Chlamydia/NGC rt PCR [facility] only)     Status: None   Collection Time: 12/10/23  5:27 PM   Specimen: Urine; Genital  Result Value Ref Range Status   Specimen source GC/Chlam ENDOCERVICAL  Final   Chlamydia Tr NOT DETECTED NOT DETECTED Final   N gonorrhoeae NOT DETECTED NOT DETECTED Final    Comment: (NOTE) This CT/NG assay has not been evaluated in patients with a history of  hysterectomy. Performed at [facility], [address] [place], [place] [address]   Wet prep, genital     Status: None   Collection Time: 12/10/23  5:27 PM  Result Value Ref Range Status   Yeast Wet Prep HPF POC NONE SEEN NONE SEEN Final   Trich, Wet Prep NONE SEEN NONE SEEN Final   Clue Cells Wet Prep HPF POC NONE SEEN NONE SEEN Final   WBC, Wet Prep HPF POC <10 <10 Final   Sperm NONE SEEN  Final    Comment: Performed at [facility], [address] [place], [place] [address]     Radiology Studies: CT ABDOMEN PELVIS W CONTRAST Result Date: 12/17/2023 CLINICAL DATA:  Abdominal pain, acute, nonlocalized EXAM: CT ABDOMEN AND PELVIS WITH CONTRAST TECHNIQUE: Multidetector CT imaging of the abdomen and pelvis was performed using the  standard protocol following bolus administration of intravenous contrast. RADIATION DOSE REDUCTION: This exam was performed according to the departmental dose-optimization program which includes automated exposure control, adjustment of the mA and/or kV according to patient size and/or use of iterative reconstruction technique. CONTRAST:  75mL OMNIPAQUE  IOHEXOL  300 MG/ML  SOLN COMPARISON:  Abdominopelvic CT 08/22/2021.  Chest CTA 12/09/2023. FINDINGS: Lower chest: Interval improved aeration of the right lung with partial re-expansion of the right lower lobe. There is residual partial right lower lobe collapse with associated air bronchograms. The left lung base appears clear. No significant pleural or pericardial effusion. Hepatobiliary: The liver is normal in density without suspicious focal abnormality. No evidence of gallstones, gallbladder wall thickening or biliary dilatation. Pancreas: Unremarkable. No pancreatic ductal dilatation or surrounding inflammatory changes. Spleen: Normal in size without focal abnormality. Adrenals/Urinary Tract: Both adrenal glands appear normal. No evidence of urinary tract calculus, suspicious renal lesion or hydronephrosis. The bladder appears unremarkable for its degree of distention. Stomach/Bowel: Enteric contrast has passed into the mid colon. There is limited contrast within the stomach and small bowel. The stomach appears unremarkable for its degree of distension. No evidence of bowel wall thickening, distention or surrounding inflammatory change. The appendix is tentatively identified and appears normal. There is prominent stool throughout the colon. Vascular/Lymphatic: There are no enlarged abdominal or pelvic lymph  nodes. Mild aortic and branch vessel atherosclerosis. No evidence of aneurysm or large vessel occlusion. Reproductive: Hysterectomy.  No evidence of adnexal mass. Other: Generalized soft tissue edema without evidence of ascites or focal extraluminal fluid  collection. Intact abdominal wall. No pneumoperitoneum. Musculoskeletal: No acute or significant osseous findings. IMPRESSION: 1. No acute findings or explanation for the patient's symptoms in the abdomen or pelvis. 2. Interval improved aeration of the right lung with partial re-expansion of the right lower lobe. There is residual partial right lower lobe collapse with associated air bronchograms. Chest radiographic follow up recommended. 3. Prominent stool throughout the colon suggesting constipation. 4. Generalized soft tissue edema without evidence of ascites or focal extraluminal fluid collection. 5.  Aortic Atherosclerosis (ICD10-I70.0). Electronically Signed   By: Elsie Perone M.D.   On: 12/17/2023 16:38    Scheduled Meds:  azithromycin   1,200 mg Oral Weekly   bictegravir-emtricitabine -tenofovir  AF  1 tablet Oral Daily   enoxaparin  (LOVENOX ) injection  40 mg Subcutaneous QHS   feeding supplement  237 mL Oral TID BM   folic acid   1 mg Oral Daily   midodrine   10 mg Oral TID WC   multivitamin with minerals  1 tablet Oral QHS   pantoprazole   40 mg Oral BID   polyethylene glycol  17 g Oral Daily   sulfamethoxazole -trimethoprim   1 tablet Oral Daily   thiamine   100 mg Oral Daily   Continuous Infusions:   LOS: 11 days    Time spent:  55min  Rendon Howell, DO [facility]  To contact the attending physician between 7A-7P please use Epic Chat. To contact the covering physician during after hours 7P-7A, please review Amion.   12/19/2023, 8:03 AM   *This document has been created with the assistance of dictation software. Please excuse typographical errors. *

## 2023-12-19 NOTE — Plan of Care (Signed)
  Problem: Education: Goal: Ability to describe self-care measures that may prevent or decrease complications (Diabetes Survival Skills Education) will improve Outcome: Progressing Goal: Individualized Educational Video(s) Outcome: Progressing   Problem: Coping: Goal: Ability to adjust to condition or change in health will improve Outcome: Progressing   Problem: Fluid Volume: Goal: Ability to maintain a balanced intake and output will improve Outcome: Progressing   Problem: Health Behavior/Discharge Planning: Goal: Ability to identify and utilize available resources and services will improve Outcome: Progressing Goal: Ability to manage health-related needs will improve Outcome: Progressing   Problem: Metabolic: Goal: Ability to maintain appropriate glucose levels will improve Outcome: Progressing   Problem: Nutritional: Goal: Maintenance of adequate nutrition will improve Outcome: Progressing Goal: Progress toward achieving an optimal weight will improve Outcome: Progressing   Problem: Skin Integrity: Goal: Risk for impaired skin integrity will decrease Outcome: Progressing   Problem: Tissue Perfusion: Goal: Adequacy of tissue perfusion will improve Outcome: Progressing   Problem: Education: Goal: Knowledge of General Education information will improve Description: Including pain rating scale, medication(s)/side effects and non-pharmacologic comfort measures Outcome: Progressing   Problem: Health Behavior/Discharge Planning: Goal: Ability to manage health-related needs will improve Outcome: Progressing   Problem: Clinical Measurements: Goal: Ability to maintain clinical measurements within normal limits will improve Outcome: Progressing Goal: Will remain free from infection Outcome: Progressing Goal: Diagnostic test results will improve Outcome: Progressing Goal: Respiratory complications will improve Outcome: Progressing Goal: Cardiovascular complication will  be avoided Outcome: Progressing   Problem: Elimination: Goal: Will not experience complications related to bowel motility Outcome: Progressing Goal: Will not experience complications related to urinary retention Outcome: Progressing   Problem: Pain Management: Goal: General experience of comfort will improve Outcome: Progressing   Problem: Safety: Goal: Ability to remain free from injury will improve Outcome: Progressing   Problem: Skin Integrity: Goal: Risk for impaired skin integrity will decrease Outcome: Progressing   Problem: Activity: Goal: Ability to tolerate increased activity will improve Outcome: Progressing   Problem: Respiratory: Goal: Ability to maintain a clear airway and adequate ventilation will improve Outcome: Progressing

## 2023-12-20 ENCOUNTER — Inpatient Hospital Stay: Payer: MEDICAID

## 2023-12-20 DIAGNOSIS — J189 Pneumonia, unspecified organism: Secondary | ICD-10-CM | POA: Diagnosis not present

## 2023-12-20 DIAGNOSIS — G9341 Metabolic encephalopathy: Secondary | ICD-10-CM | POA: Diagnosis not present

## 2023-12-20 DIAGNOSIS — J9601 Acute respiratory failure with hypoxia: Secondary | ICD-10-CM | POA: Diagnosis not present

## 2023-12-20 DIAGNOSIS — F1410 Cocaine abuse, uncomplicated: Secondary | ICD-10-CM | POA: Diagnosis not present

## 2023-12-20 DIAGNOSIS — B20 Human immunodeficiency virus [HIV] disease: Secondary | ICD-10-CM | POA: Diagnosis not present

## 2023-12-20 LAB — COMPREHENSIVE METABOLIC PANEL
ALT: 35 U/L (ref 0–44)
AST: 33 U/L (ref 15–41)
Albumin: 2.9 g/dL — ABNORMAL LOW (ref 3.5–5.0)
Alkaline Phosphatase: 53 U/L (ref 38–126)
Anion gap: 9 (ref 5–15)
BUN: 22 mg/dL — ABNORMAL HIGH (ref 6–20)
CO2: 25 mmol/L (ref 22–32)
Calcium: 9 mg/dL (ref 8.9–10.3)
Chloride: 109 mmol/L (ref 98–111)
Creatinine, Ser: 0.98 mg/dL (ref 0.44–1.00)
GFR, Estimated: >60 mL/min (ref >60)
Glucose, Bld: 91 mg/dL (ref 70–99)
Potassium: 4.6 mmol/L (ref 3.5–5.1)
Sodium: 143 mmol/L (ref 135–145)
Total Bilirubin: 0.4 mg/dL (ref 0.0–1.2)
Total Protein: 7.6 g/dL (ref 6.5–8.1)

## 2023-12-20 LAB — CBC WITH DIFFERENTIAL/PLATELET
Abs Immature Granulocytes: 0.01 10*3/uL (ref 0.00–0.07)
Basophils Absolute: 0 10*3/uL (ref 0.0–0.1)
Basophils Relative: 1 %
Eosinophils Absolute: 0.4 10*3/uL (ref 0.0–0.5)
Eosinophils Relative: 7 %
HCT: 27.3 % — ABNORMAL LOW (ref 36.0–46.0)
Hemoglobin: 8.4 g/dL — ABNORMAL LOW (ref 12.0–15.0)
Immature Granulocytes: 0 %
Lymphocytes Relative: 13 %
Lymphs Abs: 0.7 10*3/uL (ref 0.7–4.0)
MCH: 26.8 pg (ref 26.0–34.0)
MCHC: 30.8 g/dL (ref 30.0–36.0)
MCV: 86.9 fL (ref 80.0–100.0)
Monocytes Absolute: 0.5 10*3/uL (ref 0.1–1.0)
Monocytes Relative: 9 %
Neutro Abs: 3.9 10*3/uL (ref 1.7–7.7)
Neutrophils Relative %: 70 %
Platelets: 315 10*3/uL (ref 150–400)
RBC: 3.14 MIL/uL — ABNORMAL LOW (ref 3.87–5.11)
RDW: 15.4 % (ref 11.5–15.5)
WBC: 5.5 10*3/uL (ref 4.0–10.5)
nRBC: 0 % (ref 0.0–0.2)

## 2023-12-20 LAB — GENOSURE PRIME (GSPRIL)

## 2023-12-20 LAB — LACTATE DEHYDROGENASE: LDH: 118 U/L (ref 98–192)

## 2023-12-20 LAB — PHOSPHORUS: Phosphorus: 5 mg/dL — ABNORMAL HIGH (ref 2.5–4.6)

## 2023-12-20 LAB — MAGNESIUM: Magnesium: 2.3 mg/dL (ref 1.7–2.4)

## 2023-12-20 MED ORDER — NICOTINE POLACRILEX 2 MG MT GUM: 2.0000 mg | CHEWING_GUM | OROMUCOSAL | Status: DC | PRN

## 2023-12-20 MED ORDER — LORAZEPAM 2 MG/ML IJ SOLN
INTRAMUSCULAR | Status: AC
  Filled 2023-12-20: qty 1

## 2023-12-20 MED ORDER — LORAZEPAM 2 MG/ML IJ SOLN
2.0000 mg | Freq: Once | INTRAMUSCULAR | Status: AC
  Administered 2023-12-20: 2 mg via INTRAMUSCULAR

## 2023-12-20 MED ORDER — LORAZEPAM 2 MG/ML IJ SOLN: 2.0000 mg | Freq: Once | INTRAMUSCULAR | Status: DC

## 2023-12-20 NOTE — Progress Notes (Signed)
 At 2012 contacted on call provider via secure chat as follows:  This patient is continuously getting out of bed, she will not lay down, or stay seated, noncooperative, stating she is going home, however she is homeless, agitated, anxious, restless. She is not oriented, only to self. I will say that she does have a history of alcohol and drug abuse. She was positive for cocaine when she came in. She has aids and scabies and a history of ESBL. Should she be a CIWA? This would be about the time she may be withdawling We have had several people in the room with her and have not been able to leave her.

## 2023-12-20 NOTE — Progress Notes (Signed)
 Pt behaviors: continues to push call bell, repeatedly, but does not need anything. Oriented to call bell and use and had her lay it down beside her. Continued to push bell. At the beginning of the shift when doing assessment pt stated she had to move her bowels. When RN went to help her she stated that she does not need to go. Once nurse redirected attention back to assessment and meds pt stated she had to move her bowel. Once again attempt to help her and she stated she didn't have to go.

## 2023-12-20 NOTE — Progress Notes (Addendum)
 Per unit RN pt is needing PIV due to withdrawal symptoms and does not have PIV access at this time. PIV was placed without difficulty and pt was cooperative and tolerated well.

## 2023-12-20 NOTE — Progress Notes (Signed)
 PT Cancellation Note  Patient Details Name: Meghan Jarvis MRN: [id number] DOB: 1976-11-20   Cancelled Treatment:    Reason Eval/Treat Not Completed: Other (comment).  Chart reviewed and attempted to see pt.  Pt reporting too significant of pain in the R leg to patrticipate in therapy.  Pt requesting pain medication.  Attempted to notify nursing, however unable to find nurse for this pt.  Attempted to call, but did not answer.  Charge nurse notified.  Will re-attempt at later time/date as medically appropriate.    Fonda Simpers, PT, DPT Physical Therapist - [facility]  12/20/23, 2:47 PM

## 2023-12-20 NOTE — Plan of Care (Signed)
  Problem: Education: Goal: Ability to describe self-care measures that may prevent or decrease complications (Diabetes Survival Skills Education) will improve Outcome: Progressing Goal: Individualized Educational Video(s) Outcome: Progressing   Problem: Coping: Goal: Ability to adjust to condition or change in health will improve Outcome: Progressing   Problem: Fluid Volume: Goal: Ability to maintain a balanced intake and output will improve Outcome: Progressing   Problem: Health Behavior/Discharge Planning: Goal: Ability to identify and utilize available resources and services will improve Outcome: Progressing Goal: Ability to manage health-related needs will improve Outcome: Progressing   Problem: Metabolic: Goal: Ability to maintain appropriate glucose levels will improve Outcome: Progressing   Problem: Nutritional: Goal: Maintenance of adequate nutrition will improve Outcome: Progressing Goal: Progress toward achieving an optimal weight will improve Outcome: Progressing   Problem: Skin Integrity: Goal: Risk for impaired skin integrity will decrease Outcome: Progressing   Problem: Tissue Perfusion: Goal: Adequacy of tissue perfusion will improve Outcome: Progressing   Problem: Education: Goal: Knowledge of General Education information will improve Description: Including pain rating scale, medication(s)/side effects and non-pharmacologic comfort measures Outcome: Progressing   Problem: Health Behavior/Discharge Planning: Goal: Ability to manage health-related needs will improve Outcome: Progressing   Problem: Clinical Measurements: Goal: Ability to maintain clinical measurements within normal limits will improve Outcome: Progressing Goal: Will remain free from infection Outcome: Progressing Goal: Diagnostic test results will improve Outcome: Progressing Goal: Respiratory complications will improve Outcome: Progressing Goal: Cardiovascular complication will  be avoided Outcome: Progressing   Problem: Activity: Goal: Risk for activity intolerance will decrease Outcome: Progressing   Problem: Nutrition: Goal: Adequate nutrition will be maintained Outcome: Progressing   Problem: Coping: Goal: Level of anxiety will decrease Outcome: Progressing   Problem: Elimination: Goal: Will not experience complications related to bowel motility Outcome: Progressing Goal: Will not experience complications related to urinary retention Outcome: Progressing   Problem: Pain Management: Goal: General experience of comfort will improve Outcome: Progressing   Problem: Safety: Goal: Ability to remain free from injury will improve Outcome: Progressing   Problem: Skin Integrity: Goal: Risk for impaired skin integrity will decrease Outcome: Progressing   Problem: Activity: Goal: Ability to tolerate increased activity will improve Outcome: Progressing   Problem: Respiratory: Goal: Ability to maintain a clear airway and adequate ventilation will improve Outcome: Progressing

## 2023-12-20 NOTE — Progress Notes (Addendum)
       CROSS COVER NOTE  NAME: Meghan Jarvis MRN: [id number] DOB : 07-13-1976 ATTENDING PHYSICIAN: Dezii, Alexandra, DO    Date of Service   12/20/2023   HPI/Events of Note   Informed patient severely agitated and wants to go home. Unable to redirect   Interventions   Assessment/Plan: Bedside patient sitting edge of bed, jittery, runny nose, hands clenched. Explained her symptoms could be related to withdrawal and she agreed. However, she denied use of alcohol or drugs.  Tox screen in ED + cocaine, no alcohol 2 mg IM ativan  ordered        Erminio LITTIE Cone NP [facility] Cross Cover 7pm-7am - check amion for availability Pager [phone]

## 2023-12-20 NOTE — Progress Notes (Signed)
 PROGRESS NOTE    Meghan Jarvis  FMW:[id number] DOB: [birth year] DOA: 12/08/2023 [name]  Chief Complaint  Patient presents with   Altered Mental Status    Hospital Course:  Meghan Jarvis is 48 y.o. female with history of alcohol abuse, cocaine abuse, homelessness, HIV/AIDS, atrial fibrillation, hypothyroidism, type 2 diabetes, who presented to [facility] with acute respiratory failure.  Reportedly she was found down by a friend who would not wake up.  She was hypoxic in the 70s on arrival. She was intubated and admitted by the critical care service.  She was started on antibiotics for presumed aspiration pneumonia.  Urine tox positive for cocaine.  Patient was successfully extubated on 12/30 and transferred to [facility] on 1/1.  Patient's stay has then been prolonged pending rehab placement and complaints of various pain.  Did complain of chest pain but cardiac enzymes were negative x 2 and EKG did not reveal any ST elevations.  She also complained of abdominal pain, right upper quadrant ultrasound negative.  She complained of a headache, MRI brain negative for any toxoplasmosis lesions but did show advanced cerebral atrophy.  L1/5 patient had some hypotension which was responsive to 2 fluid boluses and midodrine .  Patient continued to complain of abdominal pain on 1/6 that she underwent CT scan which was negative for acute process and in fact revealed better aeration of right lower lung.  It did reveal some constipation so she was started on bowel regimen.  Subjective: On evaluation today patient reports pain on her right side flank.  Is not complaining of any shoulder pain.    Objective: Vitals:   12/19/23 0758 12/19/23 1538 12/19/23 2007 12/20/23 0343  BP: 91/62 93/60 106/74   Pulse: 71 72 74   Resp: 18 16 17    Temp: (!) 97.5 F (36.4 C) 98.6 F (37 C) 98.3 F (36.8 C)   TempSrc:      SpO2: 100% 100% 100%   Weight:    50.5 kg  Height:        Intake/Output Summary  (Last 24 hours) at 12/20/2023 0737 Last data filed at 12/19/2023 1900 Gross per 24 hour  Intake 240 ml  Output --  Net 240 ml   Filed Weights   12/18/23 0500 12/19/23 0533 12/20/23 0343  Weight: 52.3 kg 55.1 kg 50.5 kg    Examination: General exam: Appears calm and comfortable, easily agitated Respiratory system: No work of breathing, symmetric chest wall expansion Cardiovascular system: S1 & S2 heard, RRR.  Gastrointestinal system: Right-sided flank tenderness to palpation. Neuro: Alert and oriented. No focal neurological deficits. Extremities: Symmetric Skin: rashes and lesions in various stages of healing, some surrounding excoriations. Psychiatry: agitated but redirectable   Assessment & Plan:  Principal Problem:   Abdominal pain Active Problems:   Hypotension   Acute hypoxic respiratory failure (HCC)   Severe sepsis (HCC)   Pneumonia of right lower lobe due to infectious organism   Acute metabolic encephalopathy   AIDS (HCC)   Protein-calorie malnutrition, severe   Pancytopenia (HCC)   Headache   Cocaine abuse (HCC)   Right upper quadrant abdominal pain   Alcohol abuse   Uncontrolled type 2 diabetes mellitus with hypoglycemia, without long-term current use of insulin  (HCC)   Chest pain   Generalized weakness   Diarrhea   Underweight (BMI < 18.5)  Acute hypoxic respiratory failure - Secondary to drug overdose and aspiration pneumonia - Initially managed in ICU on ventilator - Successfully extubated 12/30 -  Now on room air - Has completed antibiotic course - Monitor respiratory status closely  Hypotension - Continue midodrine , MAP goal above 65  Severe sepsis - On admission: Tachycardia, tachypnea, fever, pneumonia. - Secondary to aspiration pneumonia as above - Status post antibiotics  Acute metabolic encephalopathy - Secondary to severe sepsis as above - MRI negative for acute pathology, does show severe cerebral atrophy - Appears to have improved from  admission - Continue frequent reorientation  Aspiration pneumonia - Status post 5 days antibiotics as above  AIDS HIV - Viral load 2 million, CD4: 14 - ID consulted - Currently on PCP and MAI prophylaxis with Bactrim  and Zithromax  - Toxoplasma IgG came back elevated, MRI of the brain does not show any enhancing lesions -- Fungitell +, reviewing with ID -- AFB still pending - Patient has been initiated on Biktarvy  - Will need close outpatient follow-up -- Keep universal precautions  Headache - Resolved - Required IV magnesium  earlier in hospital course - Cryptococcus antigen negative  Pancytopenia - Secondary to HIV as above - Platelet count now within normal range - Continue to trend CBC  Protein calorie malnutrition, severe BMI under 18 - Continue supplements - Nutrition consult  Cocaine abuse - Has been counseled on cessation  Alcohol abuse - Now out of withdrawal window - Continue with thiamine , multivitamin, folic acid  supplementation  Controlled type 2 diabetes with hyperglycemia, without long-term use of insulin  - Hemoglobin A1c 6.0%  Abdominal pain - Intermittent - Continue bowel regimen - Right upper quadrant ultrasound and CT scan negative except for constipation.  Constipation - Continue bowel regimen as above  Generalized weakness Frequent falls - Secondary to all above conditions - PT/OT - PT recommending rehab, TOC consulted for placement  Chest pain - Atypical - Troponin x 2 negative, EKG without ST elevations  Chronic pain - Patient complains of pain all over. Work up for acute pathology as above - 1/8 Initiated Cymbalta  for chronic pain management.  Start at 30 mg daily and titrate up as patient tolerates      DVT prophylaxis: Lovenox    Code Status: Full Code Family Communication: none at bedside Disposition:  Status is: Inpatient, pending rehab placement    Consultants:  Infectious disease Procedures:   N/a  Antimicrobials:  Anti-infectives (From admission, onward)    Start     Dose/Rate Route Frequency Ordered Stop   12/19/23 1400  azithromycin  (ZITHROMAX ) tablet 1,200 mg        1,200 mg Oral Weekly 12/18/23 1346     12/18/23 1000  sulfamethoxazole -trimethoprim  (BACTRIM  DS) 800-160 MG per tablet 1 tablet        1 tablet Oral Daily 12/17/23 1621     12/14/23 1530  bictegravir-emtricitabine -tenofovir  AF (BIKTARVY ) 50-200-25 MG per tablet 1 tablet        1 tablet Oral Daily 12/14/23 1442     12/12/23 1000  amoxicillin -clavulanate (AUGMENTIN ) 875-125 MG per tablet 1 tablet        1 tablet Oral Every 12 hours 12/11/23 1345 12/12/23 2131   12/12/23 1000  azithromycin  (ZITHROMAX ) tablet 1,250 mg  Status:  Discontinued        1,200 mg Oral Weekly 12/11/23 1618 12/18/23 1346   12/11/23 2000  sulfamethoxazole -trimethoprim  (BACTRIM ) 400-80 MG per tablet 1 tablet  Status:  Discontinued        1 tablet Oral Daily 12/11/23 1618 12/17/23 1621   12/09/23 2200  azithromycin  (ZITHROMAX ) 500 mg in sodium chloride  0.9 % 250 mL IVPB  Status:  Discontinued        500 mg 250 mL/hr over 60 Minutes Intravenous Every 24 hours 12/09/23 0734 12/11/23 1618   12/09/23 2000  cefTRIAXone  (ROCEPHIN ) 2 g in sodium chloride  0.9 % 100 mL IVPB  Status:  Discontinued        2 g 200 mL/hr over 30 Minutes Intravenous Every 24 hours 12/09/23 0953 12/09/23 1840   12/09/23 2000  piperacillin -tazobactam (ZOSYN ) IVPB 3.375 g        3.375 g 12.5 mL/hr over 240 Minutes Intravenous Every 8 hours 12/09/23 1850 12/12/23 0037   12/09/23 0600  Ampicillin -Sulbactam (UNASYN ) 3 g in sodium chloride  0.9 % 100 mL IVPB  Status:  Discontinued        3 g 200 mL/hr over 30 Minutes Intravenous Every 6 hours 12/09/23 0442 12/09/23 0953   12/08/23 2215  cefTRIAXone  (ROCEPHIN ) 2 g in sodium chloride  0.9 % 100 mL IVPB        2 g 200 mL/hr over 30 Minutes Intravenous Once 12/08/23 2214 12/08/23 2308   12/08/23 2215  azithromycin  (ZITHROMAX ) 500  mg in sodium chloride  0.9 % 250 mL IVPB        500 mg 250 mL/hr over 60 Minutes Intravenous  Once 12/08/23 2214 12/09/23 0000       Data Reviewed: I have personally reviewed following labs and imaging studies CBC: Recent Labs  Lab 12/14/23 0408 12/16/23 1229 12/18/23 0830 12/20/23 0401  WBC 3.2* 4.7 4.1 5.5  NEUTROABS  --   --   --  3.9  HGB 9.3* 8.6* 9.1* 8.4*  HCT 30.1* 27.4* 29.8* 27.3*  MCV 87.0 86.2 85.9 86.9  PLT 165 231 311 315   Basic Metabolic Panel: Recent Labs  Lab 12/14/23 0408 12/16/23 1229 12/18/23 0716 12/20/23 0401  NA 140 144 145 143  K 3.5 3.9 4.5 4.6  CL 108 112* 111 109  CO2 25 23 23 25   GLUCOSE 106* 126* 86 91  BUN 13 18 18  22*  CREATININE 0.77 0.73 0.80 0.98  CALCIUM  8.5* 8.2* 8.9 9.0  MG  --   --   --  2.3  PHOS  --   --   --  5.0*   GFR: Estimated Creatinine Clearance: 55.4 mL/min (by C-G formula based on SCr of 0.98 mg/dL). Liver Function Tests: Recent Labs  Lab 12/16/23 1229 12/20/23 0401  AST 43* 33  ALT 33 35  ALKPHOS 38 53  BILITOT 0.3 0.4  PROT 6.8 7.6  ALBUMIN 2.5* 2.9*   CBG: Recent Labs  Lab 12/14/23 0740 12/14/23 1206 12/14/23 1602 12/14/23 2041 12/16/23 2053  GLUCAP 92 128* 85 111* 103*    Recent Results (from the past 240 hours)  Chlamydia/NGC rt PCR ([facility] only)     Status: None   Collection Time: 12/10/23  5:27 PM   Specimen: Urine; Genital  Result Value Ref Range Status   Specimen source GC/Chlam ENDOCERVICAL  Final   Chlamydia Tr NOT DETECTED NOT DETECTED Final   N gonorrhoeae NOT DETECTED NOT DETECTED Final    Comment: (NOTE) This CT/NG assay has not been evaluated in patients with a history of  hysterectomy. Performed at [facility], [address] [place], [place] [address]   Wet prep, genital     Status: None   Collection Time: 12/10/23  5:27 PM  Result Value Ref Range Status   Yeast Wet Prep HPF POC NONE SEEN NONE SEEN Final   Trich, Wet Prep NONE SEEN NONE SEEN Final  Clue  Cells Wet Prep HPF POC NONE SEEN NONE SEEN Final   WBC, Wet Prep HPF POC <10 <10 Final   Sperm NONE SEEN  Final    Comment: Performed at [facility], [address] [place], [place] [address]     Radiology Studies: No results found.   Scheduled Meds:  azithromycin   1,200 mg Oral Weekly   bictegravir-emtricitabine -tenofovir  AF  1 tablet Oral Daily   DULoxetine   30 mg Oral QHS   enoxaparin  (LOVENOX ) injection  40 mg Subcutaneous QHS   feeding supplement  237 mL Oral TID BM   folic acid   1 mg Oral Daily   midodrine   10 mg Oral TID WC   multivitamin with minerals  1 tablet Oral QHS   pantoprazole   40 mg Oral BID   polyethylene glycol  17 g Oral Daily   sulfamethoxazole -trimethoprim   1 tablet Oral Daily   thiamine   100 mg Oral Daily   Continuous Infusions:   LOS: 12 days    Time spent:  55min  Rishikesh Khachatryan, DO [facility]  To contact the attending physician between 7A-7P please use Epic Chat. To contact the covering physician during after hours 7P-7A, please review Amion.   12/20/2023, 7:37 AM   *This document has been created with the assistance of dictation software. Please excuse typographical errors. *

## 2023-12-20 NOTE — Progress Notes (Signed)
 IM one time dose of 2mg  Avtivan administered in left quad muscle, tolerated well. Pain medication administered at that time as well for generalized discomfort. Pt continues to be agitated and states her anger. Continued to stand and not stay seated but is very unsteady. She did admit to wanting to go so she can use and smoke a cigarette. He fiance was here and took with him her $20. She was given icecream and an icee and a soda, however she is not eating it. Needs continuous redirection and guidance but does not follow direction and has significant short term memory loss noted. Pt does not have IV access and IV team is here to place IV. Nicotine  supplement ordered.

## 2023-12-21 ENCOUNTER — Inpatient Hospital Stay: Payer: MEDICAID

## 2023-12-21 DIAGNOSIS — B20 Human immunodeficiency virus [HIV] disease: Secondary | ICD-10-CM | POA: Diagnosis not present

## 2023-12-21 DIAGNOSIS — J9601 Acute respiratory failure with hypoxia: Secondary | ICD-10-CM | POA: Diagnosis not present

## 2023-12-21 DIAGNOSIS — R1011 Right upper quadrant pain: Secondary | ICD-10-CM | POA: Diagnosis not present

## 2023-12-21 DIAGNOSIS — J189 Pneumonia, unspecified organism: Secondary | ICD-10-CM | POA: Diagnosis not present

## 2023-12-21 DIAGNOSIS — F1410 Cocaine abuse, uncomplicated: Secondary | ICD-10-CM | POA: Diagnosis not present

## 2023-12-21 LAB — CBC WITH DIFFERENTIAL/PLATELET
Abs Immature Granulocytes: 0.01 10*3/uL (ref 0.00–0.07)
Basophils Absolute: 0 10*3/uL (ref 0.0–0.1)
Basophils Relative: 1 %
Eosinophils Absolute: 0.4 10*3/uL (ref 0.0–0.5)
Eosinophils Relative: 10 %
HCT: 29.3 % — ABNORMAL LOW (ref 36.0–46.0)
Hemoglobin: 9.1 g/dL — ABNORMAL LOW (ref 12.0–15.0)
Immature Granulocytes: 0 %
Lymphocytes Relative: 16 %
Lymphs Abs: 0.6 10*3/uL — ABNORMAL LOW (ref 0.7–4.0)
MCH: 26.8 pg (ref 26.0–34.0)
MCHC: 31.1 g/dL (ref 30.0–36.0)
MCV: 86.4 fL (ref 80.0–100.0)
Monocytes Absolute: 0.4 10*3/uL (ref 0.1–1.0)
Monocytes Relative: 10 %
Neutro Abs: 2.4 10*3/uL (ref 1.7–7.7)
Neutrophils Relative %: 63 %
Platelets: 346 10*3/uL (ref 150–400)
RBC: 3.39 MIL/uL — ABNORMAL LOW (ref 3.87–5.11)
RDW: 15.4 % (ref 11.5–15.5)
WBC: 3.9 10*3/uL — ABNORMAL LOW (ref 4.0–10.5)
nRBC: 0 % (ref 0.0–0.2)

## 2023-12-21 LAB — PHOSPHORUS: Phosphorus: 5.1 mg/dL — ABNORMAL HIGH (ref 2.5–4.6)

## 2023-12-21 LAB — ACID FAST CULTURE WITH REFLEXED SENSITIVITIES (MYCOBACTERIA)

## 2023-12-21 LAB — COMPREHENSIVE METABOLIC PANEL
ALT: 31 U/L (ref 0–44)
AST: 26 U/L (ref 15–41)
Albumin: 3 g/dL — ABNORMAL LOW (ref 3.5–5.0)
Alkaline Phosphatase: 52 U/L (ref 38–126)
Anion gap: 9 (ref 5–15)
BUN: 19 mg/dL (ref 6–20)
CO2: 25 mmol/L (ref 22–32)
Calcium: 9.5 mg/dL (ref 8.9–10.3)
Chloride: 106 mmol/L (ref 98–111)
Creatinine, Ser: 0.96 mg/dL (ref 0.44–1.00)
GFR, Estimated: >60 mL/min (ref >60)
Glucose, Bld: 77 mg/dL (ref 70–99)
Potassium: 4.6 mmol/L (ref 3.5–5.1)
Sodium: 140 mmol/L (ref 135–145)
Total Bilirubin: 0.5 mg/dL (ref 0.0–1.2)
Total Protein: 7.9 g/dL (ref 6.5–8.1)

## 2023-12-21 LAB — ACID FAST SMEAR (AFB, MYCOBACTERIA)

## 2023-12-21 LAB — MAGNESIUM: Magnesium: 2.2 mg/dL (ref 1.7–2.4)

## 2023-12-21 MED ORDER — LORAZEPAM 2 MG/ML IJ SOLN
2.0000 mg | Freq: Once | INTRAMUSCULAR | Status: AC
  Administered 2023-12-21: 2 mg via INTRAVENOUS
  Filled 2023-12-21: qty 1

## 2023-12-21 MED ORDER — GADOBUTROL 1 MMOL/ML IV SOLN
5.0000 mL | Freq: Once | INTRAVENOUS | Status: AC | PRN
  Administered 2023-12-21: 5 mL via INTRAVENOUS

## 2023-12-21 NOTE — Plan of Care (Signed)

## 2023-12-21 NOTE — Progress Notes (Signed)
 Pt transferred to [facility] to void and noted that pt is menstruating. She stated her last period was a couple weeks ago.

## 2023-12-21 NOTE — Progress Notes (Signed)
 Date of Admission:  12/08/2023     ID: Meghan Jarvis is a 48 y.o. female  Principal Problem:   Abdominal pain Active Problems:   Alcohol abuse   Acute hypoxic respiratory failure (HCC)   Pneumonia of right lower lobe due to infectious organism   Protein-calorie malnutrition, severe   Acute metabolic encephalopathy   Uncontrolled type 2 diabetes mellitus with hypoglycemia, without long-term current use of insulin  (HCC)   Cocaine abuse (HCC)   AIDS (HCC)   Severe sepsis (HCC)   Chest pain   Pancytopenia (HCC)   Generalized weakness   Diarrhea   Right upper quadrant abdominal pain   Headache   Underweight (BMI < 18.5)   Hypotension  ? Meghan Jarvis is a 49 y.o. with a history of AIDS, not compliant with meds or visits ,  , cocaine use was brought in by EMS after being found unresponsive on the couch  in an aquaintance place-   Subjective: In bed Continues to c/o rt sided abdominal pain Eating a bit Not very cooperative   Medications:   azithromycin   1,200 mg Oral Weekly   bictegravir-emtricitabine -tenofovir  AF  1 tablet Oral Daily   DULoxetine   30 mg Oral QHS   enoxaparin  (LOVENOX ) injection  40 mg Subcutaneous QHS   feeding supplement  237 mL Oral TID BM   folic acid   1 mg Oral Daily   midodrine   10 mg Oral TID WC   multivitamin with minerals  1 tablet Oral QHS   pantoprazole   40 mg Oral BID   polyethylene glycol  17 g Oral Daily   sulfamethoxazole -trimethoprim   1 tablet Oral Daily   thiamine   100 mg Oral Daily    Objective: Vital signs in last 24 hours: Patient Vitals for the past 24 hrs:  BP Temp Pulse Resp SpO2 Weight  12/21/23 0811 103/75 97.6 F (36.4 C) 90 15 98 % --  12/21/23 0555 97/60 98.2 F (36.8 C) 89 -- 100 % --  12/20/23 2128 104/79 98.2 F (36.8 C) 70 18 100 % 52.5 kg  12/20/23 1626 95/62 98.5 F (36.9 C) 100 -- 100 % --    Awake and alert Not cooperative  Irritable when I examine her Blisers over her skin are healing Left thumb  blister flaccid Swelling much better Chest b/l air entry [id number] CNS grossly non focal   Lab Results    Latest Ref Rng & Units 12/21/2023    6:17 AM 12/20/2023    4:01 AM 12/18/2023    8:30 AM  CBC  WBC 4.0 - 10.5 K/uL 3.9  5.5  4.1   Hemoglobin 12.0 - 15.0 g/dL 9.1  8.4  9.1   Hematocrit 36.0 - 46.0 % 29.3  27.3  29.8   Platelets 150 - 400 K/uL 346  315  311        Latest Ref Rng & Units 12/21/2023    6:17 AM 12/20/2023    4:01 AM 12/18/2023    7:16 AM  CMP  Glucose 70 - 99 mg/dL 77  91  86   BUN 6 - 20 mg/dL 19  22  18    Creatinine 0.44 - 1.00 mg/dL 9.03  9.01  9.19   Sodium 135 - 145 mmol/L 140  143  145   Potassium 3.5 - 5.1 mmol/L 4.6  4.6  4.5   Chloride 98 - 111 mmol/L 106  109  111   CO2 22 - 32 mmol/L 25  25  23   Calcium  8.9 - 10.3 mg/dL 9.5  9.0  8.9   Total Protein 6.5 - 8.1 g/dL 7.9  7.6    Total Bilirubin 0.0 - 1.2 mg/dL 0.5  0.4    Alkaline Phos 38 - 126 U/L 52  53    AST 15 - 41 U/L 26  33    ALT 0 - 44 U/L 31  35        Microbiology: 12/28 BC NG  Toxo IgG > 400 Toxo PCR neg igM neg RPR NR CMV DNA neg Crypto neg HIV RNA 2 million Cd4 is 14 ( 2.4%)     Assessment/Plan: Unresponsive /AMS secondary to cocaine use- resolved   Aspiration leading to acute hypoxic resp failure rt Middle lobe and lower lobe consolidation-- was intubated and mechanically ventilated in the beginning. Now extubated Was On zosyn  Completed rx Repeat CXR clear   Rt sided abdominl flank, chest pain- CT abd ok- rule out referred neurological pain- MRI spine   H/o fall with recurrent ED presentation- had hurt left shoulder before   Left thumb tender swelling- Xray hand no obvious fracture- resolved   Pruritus with clear blisters- rtesolved- no new one D.D  Cocaine use Insect bite Scabies- no burrows R/o primary dermatological condition like pemphigus ( less likely)  Syphilis- RPR neg   AIDS- non compliant with meds or visits to her Provider- not been in care since  2021- [facility] to be [facility] before Was on Biktarvy  at one time and was non compliant then VL now is 2 million and cd4 is 11  Started Biktarvy  on 12/14/23 . not sure how much compliance to be expected as her social situation is not conducive and so is her substance use- watch closely for Immune reconstitution inflammatory syndrome   Toxo IgG very high-  MRI brain no CNS lesions  . TOXO PCR negative On  PCP and MAI prophylaxis. 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

## 2023-12-21 NOTE — Progress Notes (Signed)
   12/21/23 0555  Assess: MEWS Score  Temp 98.2 F (36.8 C)  BP 97/60  MAP (mmHg) 73  Pulse Rate 89  SpO2 100 %  Assess: MEWS Score  MEWS Temp 0  MEWS Systolic 1  MEWS Pulse 0  MEWS RR 0  MEWS LOC 1  MEWS Score 2  MEWS Score Color Yellow  Assess: if the MEWS score is Yellow or Red  Were vital signs accurate and taken at a resting state? Yes  Does the patient meet 2 or more of the SIRS criteria? No  MEWS guidelines implemented  Yes, yellow  Treat  MEWS Interventions Considered administering scheduled or prn medications/treatments as ordered  Take Vital Signs  Increase Vital Sign Frequency  Yellow: Q2hr x1, continue Q4hrs until patient remains green for 12hrs  Escalate  MEWS: Escalate Yellow: Discuss with charge nurse and consider notifying provider and/or RRT  Notify: Charge Nurse/RN  Name of Charge Nurse/RN Notified Anthony Tamburo, rn  Assess: SIRS CRITERIA  SIRS Temperature  0  SIRS Respirations  0  SIRS Pulse 0  SIRS WBC 0  SIRS Score [name]  0

## 2023-12-21 NOTE — Plan of Care (Signed)
  Problem: Education: Goal: Ability to describe self-care measures that may prevent or decrease complications (Diabetes Survival Skills Education) will improve Outcome: Progressing Goal: Individualized Educational Video(s) Outcome: Progressing   Problem: Coping: Goal: Ability to adjust to condition or change in health will improve Outcome: Progressing   Problem: Fluid Volume: Goal: Ability to maintain a balanced intake and output will improve Outcome: Progressing   Problem: Health Behavior/Discharge Planning: Goal: Ability to identify and utilize available resources and services will improve Outcome: Progressing Goal: Ability to manage health-related needs will improve Outcome: Progressing   Problem: Metabolic: Goal: Ability to maintain appropriate glucose levels will improve Outcome: Progressing   Problem: Nutritional: Goal: Maintenance of adequate nutrition will improve Outcome: Progressing Goal: Progress toward achieving an optimal weight will improve Outcome: Progressing   Problem: Skin Integrity: Goal: Risk for impaired skin integrity will decrease Outcome: Progressing   Problem: Tissue Perfusion: Goal: Adequacy of tissue perfusion will improve Outcome: Progressing   Problem: Education: Goal: Knowledge of General Education information will improve Description: Including pain rating scale, medication(s)/side effects and non-pharmacologic comfort measures Outcome: Progressing   Problem: Health Behavior/Discharge Planning: Goal: Ability to manage health-related needs will improve Outcome: Progressing   Problem: Clinical Measurements: Goal: Ability to maintain clinical measurements within normal limits will improve Outcome: Progressing Goal: Will remain free from infection Outcome: Progressing Goal: Diagnostic test results will improve Outcome: Progressing Goal: Respiratory complications will improve Outcome: Progressing Goal: Cardiovascular complication will  be avoided Outcome: Progressing   Problem: Activity: Goal: Risk for activity intolerance will decrease Outcome: Progressing   Problem: Nutrition: Goal: Adequate nutrition will be maintained Outcome: Progressing   Problem: Coping: Goal: Level of anxiety will decrease Outcome: Progressing   Problem: Elimination: Goal: Will not experience complications related to bowel motility Outcome: Progressing Goal: Will not experience complications related to urinary retention Outcome: Progressing   Problem: Pain Management: Goal: General experience of comfort will improve Outcome: Progressing   Problem: Safety: Goal: Ability to remain free from injury will improve Outcome: Progressing   Problem: Skin Integrity: Goal: Risk for impaired skin integrity will decrease Outcome: Progressing   Problem: Activity: Goal: Ability to tolerate increased activity will improve Outcome: Progressing   Problem: Respiratory: Goal: Ability to maintain a clear airway and adequate ventilation will improve Outcome: Progressing

## 2023-12-21 NOTE — NC FL2 (Signed)
 [place]  MEDICAID FL2 LEVEL OF CARE FORM     IDENTIFICATION  Patient Name: Meghan Jarvis Birthdate: 01-Jun-1976 Sex: female Admission Date (Current Location): 12/08/2023  [facility] and [place] Number:  Chiropodist and Address:  [facility], [address], [place], [place] [address]      Provider Number: [id number]  Attending Physician Name and Address:  Leesa Kast, DO  Relative Name and Phone Number:  nipes,meredith Pricilla)  [phone]    Current Level of Care: Hospital Recommended Level of Care: Skilled Nursing Facility Prior Approval Number:    Date Approved/Denied:   PASRR Number: [id number]  Discharge Plan: SNF    Current Diagnoses: Patient Active Problem List   Diagnosis Date Noted   Abdominal pain 12/17/2023   Hypotension 12/16/2023   Headache 12/15/2023   Underweight (BMI < 18.5) 12/15/2023   Right upper quadrant abdominal pain 12/14/2023   Chest pain 12/13/2023   Pancytopenia (HCC) 12/13/2023   Generalized weakness 12/13/2023   Diarrhea 12/13/2023   Acute metabolic encephalopathy 12/12/2023   Uncontrolled type 2 diabetes mellitus with hypoglycemia, without long-term current use of insulin  (HCC) 12/12/2023   Cocaine abuse (HCC) 12/12/2023   AIDS (HCC) 12/12/2023   Severe sepsis (HCC) 12/12/2023   Protein-calorie malnutrition, severe 12/10/2023   Acute hypoxic respiratory failure (HCC) 12/09/2023   Pneumonia of right lower lobe due to infectious organism 12/09/2023   Substance induced mood disorder (HCC) 11/30/2023   Crack cocaine use 11/30/2023   Cocaine abuse with cocaine-induced mood disorder (HCC) 11/28/2023   Homeless 11/28/2023   Atrial fibrillation, new onset (HCC) 05/11/2020   Pyelonephritis 05/09/2020   HIV (human immunodeficiency virus infection) (HCC)    Alcohol use disorder, severe, dependence (HCC) 05/18/2016   Alcohol abuse 05/17/2016    Orientation RESPIRATION BLADDER Height & Weight      Self  Normal Incontinent Weight: 115 lb 11.9 oz (52.5 kg) Height:  5' 7.99 (172.7 cm)  BEHAVIORAL SYMPTOMS/MOOD NEUROLOGICAL BOWEL NUTRITION STATUS      Incontinent    AMBULATORY STATUS COMMUNICATION OF NEEDS Skin   Limited Assist Verbally Normal                       Personal Care Assistance Level of Assistance  Bathing, Feeding, Dressing Bathing Assistance: Limited assistance Feeding assistance: Limited assistance Dressing Assistance: Limited assistance     Functional Limitations Info  Sight, Hearing, Speech Sight Info: Adequate Hearing Info: Adequate Speech Info: Adequate    SPECIAL CARE FACTORS FREQUENCY  PT (By licensed PT), OT (By licensed OT)     PT Frequency: 5 times a week OT Frequency: 5 times a week            Contractures Contractures Info: Not present    Additional Factors Info  Code Status, Allergies, Isolation Precautions Code Status Info: FULL Allergies Info: Fish Allergy, Shellfish Allergy, Buprenorphine Hcl, Morphine  And Codeine     Isolation Precautions Info: Contact precautions     Current Medications (12/21/2023):  This is the current hospital active medication list Current Facility-Administered Medications  Medication Dose Route Frequency Provider Last Rate Last Admin   acetaminophen  (TYLENOL ) tablet 650 mg  650 mg Oral Q6H PRN Josette Ade, MD   650 mg at 12/19/23 2156   azithromycin  (ZITHROMAX ) tablet 1,200 mg  1,200 mg Oral Weekly Zeigler, Dustin G, RPH   1,200 mg at 12/19/23 1505   bictegravir-emtricitabine -tenofovir  AF (BIKTARVY ) 50-200-25 MG per tablet 1 tablet  1 tablet Oral  Daily Fayette Bodily, MD   1 tablet at 12/21/23 0827   DULoxetine  (CYMBALTA ) DR capsule 30 mg  30 mg Oral QHS Dezii, Alexandra, DO   30 mg at 12/20/23 2020   enoxaparin  (LOVENOX ) injection 40 mg  40 mg Subcutaneous QHS Aleskerov, Fuad, MD   40 mg at 12/20/23 2020   feeding supplement (ENSURE ENLIVE / ENSURE PLUS) liquid 237 mL  237 mL Oral TID BM  Aleskerov, Fuad, MD   237 mL at 12/21/23 9171   folic acid  (FOLVITE ) tablet 1 mg  1 mg Oral Daily Aleskerov, Fuad, MD   1 mg at 12/21/23 9173   gadobutrol  (GADAVIST ) 1 MMOL/ML injection 5 mL  5 mL Intravenous Once PRN Dezii, Alexandra, DO       iohexol  (OMNIPAQUE ) 9 MG/ML oral solution 500 mL  500 mL Oral Once PRN Josette Ade, MD       midodrine  (PROAMATINE ) tablet 10 mg  10 mg Oral TID WC Wieting, Richard, MD   10 mg at 12/21/23 1212   multivitamin with minerals tablet 1 tablet  1 tablet Oral QHS Zeigler, Dustin G, RPH   1 tablet at 12/20/23 2020   nicotine  polacrilex (NICORETTE ) gum 2 mg  2 mg Oral PRN Jesus America, NP       oxyCODONE  (Oxy IR/ROXICODONE ) immediate release tablet 5 mg  5 mg Oral Q4H PRN [name], Elizabeth Achieng, NP   5 mg at 12/20/23 2020   pantoprazole  (PROTONIX ) EC tablet 40 mg  40 mg Oral BID Josette Ade, MD   40 mg at 12/21/23 0827   polyethylene glycol (MIRALAX  / GLYCOLAX ) packet 17 g  17 g Oral Daily Josette Ade, MD   17 g at 12/21/23 9173   sulfamethoxazole -trimethoprim  (BACTRIM  DS) 800-160 MG per tablet 1 tablet  1 tablet Oral Daily Zeigler, Dustin G, RPH   1 tablet at 12/21/23 0827   thiamine  (VITAMIN B1) tablet 100 mg  100 mg Oral Daily Aleskerov, Fuad, MD   100 mg at 12/21/23 9173     Discharge Medications: Please see discharge summary for a list of discharge medications.  Relevant Imaging Results:  Relevant Lab Results:   Additional Information [id number]  Ladene Lady, LCSW

## 2023-12-21 NOTE — Progress Notes (Signed)
 PROGRESS NOTE    Meghan Jarvis  FMW:[id number] DOB: 04-Feb-1976 DOA: 12/08/2023 [name]  Chief Complaint  Patient presents with   Altered Mental Status    Hospital Course:  Meghan Jarvis is 48 y.o. female with history of alcohol abuse, cocaine abuse, homelessness, HIV/AIDS, atrial fibrillation, hypothyroidism, type 2 diabetes, who presented to [facility] with acute respiratory failure.  Reportedly she was found down by a friend who would not wake up.  She was hypoxic in the 70s on arrival. She was intubated and admitted by the critical care service.  She was started on antibiotics for presumed aspiration pneumonia.  Urine tox positive for cocaine.  Patient was successfully extubated on 12/30 and transferred to [facility] on 1/1.  Patient's stay has then been prolonged pending rehab placement and complaints of various pain.  Did complain of chest pain but cardiac enzymes were negative x 2 and EKG did not reveal any ST elevations.  She also complained of abdominal pain, right upper quadrant ultrasound negative.  She complained of a headache, MRI brain negative for any toxoplasmosis lesions but did show advanced cerebral atrophy.  L1/5 patient had some hypotension which was responsive to 2 fluid boluses and midodrine .  Patient continued to complain of abdominal pain on 1/6 that she underwent CT scan which was negative for acute process and in fact revealed better aeration of right lower lung.  It did reveal some constipation so she was started on bowel regimen.  Subjective: Overnight NP assessed patient for tremor, runny nose, clenched hands.  She was given 2 mg of Ativan . On my evaluation this morning patient is lying at the bottom of the bed in the fetal position.  She reports that she is still having right-sided chest pain.  She is not amendable to examination  Bedside RN reports patient fell out of bed, she did not hit her head.   Objective: Vitals:   12/20/23 0814 12/20/23  1626 12/20/23 2128 12/21/23 0555  BP: 106/72 95/62 104/79 97/60  Pulse: 74 100 70 89  Resp: 18  18   Temp: 98.2 F (36.8 C) 98.5 F (36.9 C) 98.2 F (36.8 C) 98.2 F (36.8 C)  TempSrc:      SpO2: 94% 100% 100% 100%  Weight:   52.5 kg   Height:        Intake/Output Summary (Last 24 hours) at 12/21/2023 0746 Last data filed at 12/20/2023 1300 Gross per 24 hour  Intake 240 ml  Output --  Net 240 ml   Filed Weights   12/19/23 0533 12/20/23 0343 12/20/23 2128  Weight: 55.1 kg 50.5 kg 52.5 kg    Examination: General exam: Lying in the fetal position at foot of bed.  Appears unwell.  Respiratory system: No work of breathing, symmetric chest wall expansion Cardiovascular system: S1 & S2 heard, RRR.  Gastrointestinal system: Right-sided flank tenderness to palpation. Neuro: Alert and oriented. Extremities: Symmetric Skin: rashes and lesions in various stages of healing, some surrounding excoriations. Psychiatry: agitated but redirectable   Assessment & Plan:  Principal Problem:   Abdominal pain Active Problems:   Hypotension   Acute hypoxic respiratory failure (HCC)   Severe sepsis (HCC)   Pneumonia of right lower lobe due to infectious organism   Acute metabolic encephalopathy   AIDS (HCC)   Protein-calorie malnutrition, severe   Pancytopenia (HCC)   Headache   Cocaine abuse (HCC)   Right upper quadrant abdominal pain   Alcohol abuse   Uncontrolled type  2 diabetes mellitus with hypoglycemia, without long-term current use of insulin  (HCC)   Chest pain   Generalized weakness   Diarrhea   Underweight (BMI < 18.5)  Acute hypoxic respiratory failure - Secondary to drug overdose and aspiration pneumonia - Initially managed in ICU on ventilator - Successfully extubated 12/30 - Now on room air - Has completed antibiotic course - Monitor respiratory status closely  Fall -Did not hit her head - Unclear reason for such profound weakness and difficulty with ambulation.   Patient also continues to complain of right-sided chest wall pain and right upper abdomen pain which does not have clear etiology on workup. - Obtain thoracic and lumbar MRI today for better evaluation to rule out referred pain.  Hypotension - Continue midodrine , MAP goal above 65  Severe sepsis - On admission: Tachycardia, tachypnea, fever, pneumonia. - Secondary to aspiration pneumonia as above - Status post antibiotics  Acute metabolic encephalopathy - Secondary to severe sepsis as above - MRI negative for acute pathology, does show severe cerebral atrophy - Appears to have improved from admission - Continue frequent reorientation  Aspiration pneumonia - Status post 5 days antibiotics as above  AIDS HIV - Viral load 2 million, CD4: 14 - ID consulted - Currently on PCP and MAI prophylaxis with Bactrim  and Zithromax  - Toxoplasma IgG came back elevated, MRI of the brain does not show any enhancing lesions -- Fungitell +, culture neg -- AFB still pending - Patient has been initiated on Biktarvy  - Will need close outpatient follow-up -- Keep universal precautions  Headache - Resolved - Required IV magnesium  earlier in hospital course - Cryptococcus antigen negative  Pancytopenia - Secondary to HIV as above - Platelet count now within normal range - Continue to trend CBC  Protein calorie malnutrition, severe BMI under 18 - Continue supplements - Nutrition consult  Cocaine abuse - Has been counseled on cessation  Alcohol abuse - Now out of withdrawal window - Continue with thiamine , multivitamin, folic acid  supplementation  Controlled type 2 diabetes with hyperglycemia, without long-term use of insulin  - Hemoglobin A1c 6.0%  Abdominal pain - Intermittent - Continue bowel regimen - Right upper quadrant ultrasound and CT scan negative except for constipation.  Constipation - Continue bowel regimen as above  Generalized weakness Frequent falls - Secondary  to all above conditions - PT/OT - PT recommending rehab, TOC consulted for placement  Chest pain - Atypical - Troponin x 2 negative, EKG without ST elevations  Chronic pain - Patient complains of pain all over. Work up for acute pathology as above - 1/8 Initiated Cymbalta  for chronic pain management.  Start at 30 mg daily and titrate up as patient tolerates  Anxiety - Pt was Evaluated overnight on 1/9 and was given Ativan  due to concerns of withdrawal.  Patient has been admitted for  2 weeks.  EtOH withdrawal unlikely at this time.  UDS on arrival positive only for cocaine.  Suspect anxiety or panic attack more likely. -- PRN ativan        DVT prophylaxis: Lovenox    Code Status: Full Code Family Communication: none at bedside Disposition:  Status is: Inpatient, pending rehab placement    Consultants:  Infectious disease Procedures:  N/a  Antimicrobials:  Anti-infectives (From admission, onward)    Start     Dose/Rate Route Frequency Ordered Stop   12/19/23 1400  azithromycin  (ZITHROMAX ) tablet 1,200 mg        1,200 mg Oral Weekly 12/18/23 1346     12/18/23 1000  sulfamethoxazole -trimethoprim  (BACTRIM  DS) 800-160 MG per tablet 1 tablet        1 tablet Oral Daily 12/17/23 1621     12/14/23 1530  bictegravir-emtricitabine -tenofovir  AF (BIKTARVY ) 50-200-25 MG per tablet 1 tablet        1 tablet Oral Daily 12/14/23 1442     12/12/23 1000  amoxicillin -clavulanate (AUGMENTIN ) 875-125 MG per tablet 1 tablet        1 tablet Oral Every 12 hours 12/11/23 1345 12/12/23 2131   12/12/23 1000  azithromycin  (ZITHROMAX ) tablet 1,250 mg  Status:  Discontinued        1,200 mg Oral Weekly 12/11/23 1618 12/18/23 1346   12/11/23 2000  sulfamethoxazole -trimethoprim  (BACTRIM ) 400-80 MG per tablet 1 tablet  Status:  Discontinued        1 tablet Oral Daily 12/11/23 1618 12/17/23 1621   12/09/23 2200  azithromycin  (ZITHROMAX ) 500 mg in sodium chloride  0.9 % 250 mL IVPB  Status:  Discontinued         500 mg 250 mL/hr over 60 Minutes Intravenous Every 24 hours 12/09/23 0734 12/11/23 1618   12/09/23 2000  cefTRIAXone  (ROCEPHIN ) 2 g in sodium chloride  0.9 % 100 mL IVPB  Status:  Discontinued        2 g 200 mL/hr over 30 Minutes Intravenous Every 24 hours 12/09/23 0953 12/09/23 1840   12/09/23 2000  piperacillin -tazobactam (ZOSYN ) IVPB 3.375 g        3.375 g 12.5 mL/hr over 240 Minutes Intravenous Every 8 hours 12/09/23 1850 12/12/23 0037   12/09/23 0600  Ampicillin -Sulbactam (UNASYN ) 3 g in sodium chloride  0.9 % 100 mL IVPB  Status:  Discontinued        3 g 200 mL/hr over 30 Minutes Intravenous Every 6 hours 12/09/23 0442 12/09/23 0953   12/08/23 2215  cefTRIAXone  (ROCEPHIN ) 2 g in sodium chloride  0.9 % 100 mL IVPB        2 g 200 mL/hr over 30 Minutes Intravenous Once 12/08/23 2214 12/08/23 2308   12/08/23 2215  azithromycin  (ZITHROMAX ) 500 mg in sodium chloride  0.9 % 250 mL IVPB        500 mg 250 mL/hr over 60 Minutes Intravenous  Once 12/08/23 2214 12/09/23 0000       Data Reviewed: I have personally reviewed following labs and imaging studies CBC: Recent Labs  Lab 12/16/23 1229 12/18/23 0830 12/20/23 0401 12/21/23 0617  WBC 4.7 4.1 5.5 3.9*  NEUTROABS  --   --  3.9 2.4  HGB 8.6* 9.1* 8.4* 9.1*  HCT 27.4* 29.8* 27.3* 29.3*  MCV 86.2 85.9 86.9 86.4  PLT 231 311 315 346   Basic Metabolic Panel: Recent Labs  Lab 12/16/23 1229 12/18/23 0716 12/20/23 0401  NA 144 145 143  K 3.9 4.5 4.6  CL 112* 111 109  CO2 23 23 25   GLUCOSE 126* 86 91  BUN 18 18 22*  CREATININE 0.73 0.80 0.98  CALCIUM  8.2* 8.9 9.0  MG  --   --  2.3  PHOS  --   --  5.0*   GFR: Estimated Creatinine Clearance: 57.6 mL/min (by C-G formula based on SCr of 0.98 mg/dL). Liver Function Tests: Recent Labs  Lab 12/16/23 1229 12/20/23 0401  AST 43* 33  ALT 33 35  ALKPHOS 38 53  BILITOT 0.3 0.4  PROT 6.8 7.6  ALBUMIN 2.5* 2.9*   CBG: Recent Labs  Lab 12/14/23 1206 12/14/23 1602  12/14/23 2041 12/16/23 2053  GLUCAP 128* 85 111* 103*  No results found for this or any previous visit (from the past 240 hours).    Radiology Studies: DG Chest Port 1 View Result Date: 12/20/2023 CLINICAL DATA:  Collapsed lung. EXAM: PORTABLE CHEST 1 VIEW COMPARISON:  CT abdomen 12/17/2023 and chest radiograph 12/08/2023 FINDINGS: Markedly improved aeration in the right lung compared to previous chest radiograph. There is no significant lung collapse on this single view. Lucency along the lateral aspect of the right chest appears to be related to overlying structures and no definite pneumothorax. No focal airspace disease or lung consolidation. There may be hyperinflation of the right lung compared to the left of uncertain etiology. Heart size is normal. Curvature in the thoracic spine may be related to patient's positioning. IMPRESSION: No significant lung consolidation or collapse on this single view. There may be some hyperinflation in the right lung but this finding could also be associated with patient positioning. Electronically Signed   By: Juliene Balder M.D.   On: 12/20/2023 14:01     Scheduled Meds:  azithromycin   1,200 mg Oral Weekly   bictegravir-emtricitabine -tenofovir  AF  1 tablet Oral Daily   DULoxetine   30 mg Oral QHS   enoxaparin  (LOVENOX ) injection  40 mg Subcutaneous QHS   feeding supplement  237 mL Oral TID BM   folic acid   1 mg Oral Daily   midodrine   10 mg Oral TID WC   multivitamin with minerals  1 tablet Oral QHS   pantoprazole   40 mg Oral BID   polyethylene glycol  17 g Oral Daily   sulfamethoxazole -trimethoprim   1 tablet Oral Daily   thiamine   100 mg Oral Daily   Continuous Infusions:   LOS: 13 days    Time spent:  55min  Bryar Dahms, DO [facility]  To contact the attending physician between 7A-7P please use Epic Chat. To contact the covering physician during after hours 7P-7A, please review Amion.   12/21/2023, 7:46 AM   *This document  has been created with the assistance of dictation software. Please excuse typographical errors. *

## 2023-12-21 NOTE — Progress Notes (Signed)
 Rounded on patient. List of [facility] contracted Skilled Nursing Facilities sent to [facility] for review.

## 2023-12-21 NOTE — TOC Progression Note (Signed)
 Transition of Care [facility]) - Progression Note    Patient Details  Name: Meghan Jarvis MRN: [id number] Date of Birth: 10-21-1976  Transition of Care [facility]) CM/SW Contact  Ladene Lady, LCSW Phone Number: 12/21/2023, 1:11 PM  Clinical Narrative:   CSW spole with [facility] CM [phone] who states she is asking for a psych consult as they feel pt lacks decision making ability. Message communicated to MD.         Expected Discharge Plan and Services                                               Social Determinants of Health (SDOH) Interventions SDOH Screenings   Food Insecurity: Patient Unable To Answer (12/09/2023)  Recent Concern: Food Insecurity - Food Insecurity Present (09/26/2023)  Housing: Patient Unable To Answer (12/09/2023)  Recent Concern: Housing - Medium Risk (09/26/2023)  Transportation Needs: Patient Unable To Answer (12/09/2023)  Recent Concern: Transportation Needs - Unmet Transportation Needs (10/11/2023)  Utilities: Patient Unable To Answer (12/09/2023)  Recent Concern: Utilities - At Risk (09/25/2023)  Tobacco Use: High Risk (12/08/2023)    Readmission Risk Interventions     No data to display

## 2023-12-21 NOTE — Progress Notes (Signed)
 PT Cancellation Note  Patient Details Name: Meghan Jarvis MRN: [id number] DOB: Jun 04, 1976   Cancelled Treatment:    Reason Eval/Treat Not Completed: Other (comment)  Arrived in room.  RN stated pt just had a fall and back to bed.  MRI ordered.  Will defer today and continue as appropriate.   Lauraine Gills 12/21/2023, 12:47 PM

## 2023-12-22 DIAGNOSIS — F1410 Cocaine abuse, uncomplicated: Secondary | ICD-10-CM | POA: Diagnosis not present

## 2023-12-22 DIAGNOSIS — B20 Human immunodeficiency virus [HIV] disease: Secondary | ICD-10-CM | POA: Diagnosis not present

## 2023-12-22 DIAGNOSIS — R1011 Right upper quadrant pain: Secondary | ICD-10-CM | POA: Diagnosis not present

## 2023-12-22 LAB — COMPREHENSIVE METABOLIC PANEL
ALT: 27 U/L (ref 0–44)
AST: 22 U/L (ref 15–41)
Albumin: 3 g/dL — ABNORMAL LOW (ref 3.5–5.0)
Alkaline Phosphatase: 59 U/L (ref 38–126)
Anion gap: 10 (ref 5–15)
BUN: 20 mg/dL (ref 6–20)
CO2: 24 mmol/L (ref 22–32)
Calcium: 9.6 mg/dL (ref 8.9–10.3)
Chloride: 103 mmol/L (ref 98–111)
Creatinine, Ser: 0.93 mg/dL (ref 0.44–1.00)
GFR, Estimated: >60 mL/min (ref >60)
Glucose, Bld: 78 mg/dL (ref 70–99)
Potassium: 4.6 mmol/L (ref 3.5–5.1)
Sodium: 137 mmol/L (ref 135–145)
Total Bilirubin: 0.4 mg/dL (ref 0.0–1.2)
Total Protein: 8.2 g/dL — ABNORMAL HIGH (ref 6.5–8.1)

## 2023-12-22 LAB — PHOSPHORUS: Phosphorus: 5.2 mg/dL — ABNORMAL HIGH (ref 2.5–4.6)

## 2023-12-22 LAB — CBC WITH DIFFERENTIAL/PLATELET
Abs Immature Granulocytes: 0.02 10*3/uL (ref 0.00–0.07)
Basophils Absolute: 0.1 10*3/uL (ref 0.0–0.1)
Basophils Relative: 1 %
Eosinophils Absolute: 0.4 10*3/uL (ref 0.0–0.5)
Eosinophils Relative: 8 %
HCT: 28.9 % — ABNORMAL LOW (ref 36.0–46.0)
Hemoglobin: 9.2 g/dL — ABNORMAL LOW (ref 12.0–15.0)
Immature Granulocytes: 0 %
Lymphocytes Relative: 15 %
Lymphs Abs: 0.7 10*3/uL (ref 0.7–4.0)
MCH: 26.6 pg (ref 26.0–34.0)
MCHC: 31.8 g/dL (ref 30.0–36.0)
MCV: 83.5 fL (ref 80.0–100.0)
Monocytes Absolute: 0.5 10*3/uL (ref 0.1–1.0)
Monocytes Relative: 11 %
Neutro Abs: 3.1 10*3/uL (ref 1.7–7.7)
Neutrophils Relative %: 65 %
Platelets: 382 10*3/uL (ref 150–400)
RBC: 3.46 MIL/uL — ABNORMAL LOW (ref 3.87–5.11)
RDW: 15.3 % (ref 11.5–15.5)
WBC: 4.8 10*3/uL (ref 4.0–10.5)
nRBC: 0 % (ref 0.0–0.2)

## 2023-12-22 LAB — MAGNESIUM: Magnesium: 2.2 mg/dL (ref 1.7–2.4)

## 2023-12-22 NOTE — Plan of Care (Signed)

## 2023-12-22 NOTE — Progress Notes (Signed)
 PROGRESS NOTE    Meghan Jarvis  FMW:[id number] DOB: 1975-12-23 DOA: 12/08/2023 [name]  Chief Complaint  Patient presents with   Altered Mental Status    Hospital Course:  Meghan Jarvis is 48 y.o. female with history of alcohol abuse, cocaine abuse, homelessness, HIV/AIDS, atrial fibrillation, hypothyroidism, type 2 diabetes, who presented to [facility] with acute respiratory failure.  Reportedly she was found down by a friend who would not wake up.  She was hypoxic in the 70s on arrival. She was intubated and admitted by the critical care service.  She was started on antibiotics for presumed aspiration pneumonia.  Urine tox positive for cocaine.  Patient was successfully extubated on 12/30 and transferred to [facility] on 1/1.  Patient's stay has then been prolonged pending rehab placement and complaints of various pain.  Did complain of chest pain but cardiac enzymes were negative x 2 and EKG did not reveal any ST elevations.  She also complained of abdominal pain, right upper quadrant ultrasound negative.  She complained of a headache, MRI brain negative for any toxoplasmosis lesions but did show advanced cerebral atrophy.  L1/5 patient had some hypotension which was responsive to 2 fluid boluses and midodrine .  Patient continued to complain of abdominal pain on 1/6 that she underwent CT scan which was negative for acute process and in fact revealed better aeration of right lower lung.  It did reveal some constipation so she was started on bowel regimen.  Subjective: No acute events overnight. On evaluation today patient reports some improvement in her pain compared to yesterday.    Objective: Vitals:   12/21/23 1558 12/21/23 2037 12/22/23 0450 12/22/23 0500  BP: (!) 89/63 107/74 94/71   Pulse: 86 66 81   Resp: 15 18 18    Temp: 98.2 F (36.8 C) 98.9 F (37.2 C) 98.1 F (36.7 C)   TempSrc:      SpO2: 99% 99% 100%   Weight:    50.4 kg  Height:       No intake or  output data in the 24 hours ending 12/22/23 0831  Filed Weights   12/20/23 0343 12/20/23 2128 12/22/23 0500  Weight: 50.5 kg 52.5 kg 50.4 kg    Examination: General exam: Sitting upright in bed, eating breakfast. NAD.   Respiratory system: No work of breathing, symmetric chest wall expansion Cardiovascular system: S1 & S2 heard, RRR.  Gastrointestinal system: Right-sided flank tenderness to palpation. Neuro: Alert and oriented. Extremities: Symmetric Skin: rashes and lesions in various stages of healing, some surrounding excoriations. Psychiatry: calm, no agitation.   Assessment & Plan:  Principal Problem:   Abdominal pain Active Problems:   Hypotension   Acute hypoxic respiratory failure (HCC)   Severe sepsis (HCC)   Pneumonia of right lower lobe due to infectious organism   Acute metabolic encephalopathy   AIDS (HCC)   Protein-calorie malnutrition, severe   Pancytopenia (HCC)   Headache   Cocaine abuse (HCC)   Right upper quadrant abdominal pain   Alcohol abuse   Uncontrolled type 2 diabetes mellitus with hypoglycemia, without long-term current use of insulin  (HCC)   Chest pain   Generalized weakness   Diarrhea   Underweight (BMI < 18.5)  Acute hypoxic respiratory failure, resolved - Secondary to drug overdose and aspiration pneumonia - Initially managed in ICU on ventilator - Successfully extubated 12/30 - Now on room air - Has completed antibiotic course - Monitor respiratory status closely  Fall -Did not hit her head -  Still with profound weakness and difficulty with ambulation.  - thoracic and lumbar MRI without acute pathology -- Weakness likely 2/2 to HAND  Hypotension - Continue midodrine , MAP goal above 65  Severe sepsis - On admission: Tachycardia, tachypnea, fever, pneumonia. - Secondary to aspiration pneumonia as above - Status post antibiotics  Acute metabolic encephalopathy - Has improved some, at this point I suspect she is at her  baseline.  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 - Initial presentation likely secondary to severe sepsis as above - MRI negative for acute pathology, does show severe cerebral atrophy - Continue frequent reorientation  Aspiration pneumonia - Status post 5 days antibiotics as above  AIDS HIV - Viral load 2 million, CD4: 14 - ID consulted - Currently on PCP and MAI prophylaxis with Bactrim  and Zithromax  - Toxoplasma IgG came back elevated, MRI of the brain does not show any enhancing lesions -- Fungitell +, culture neg -- AFB still pending - Patient has been initiated on Biktarvy  - Will need close outpatient follow-up -- Keep universal precautions  Headache - Resolved - Required IV magnesium  earlier in hospital course - Cryptococcus antigen negative  Pancytopenia - Secondary to HIV as above - Platelet count now within normal range - Continue to trend CBC  Protein calorie malnutrition, severe BMI under 18 - Continue supplements - Nutrition consult  Cocaine abuse - Has been counseled on cessation  Alcohol abuse - Now out of withdrawal window - Continue with thiamine , multivitamin, folic acid  supplementation  Controlled type 2 diabetes with hyperglycemia, without long-term use of insulin  - Hemoglobin A1c 6.0%  Abdominal pain - Intermittent - Continue bowel regimen - Right upper quadrant ultrasound and CT scan negative except for constipation.  Constipation - Continue bowel regimen as above  Generalized weakness Frequent falls - Secondary to all above conditions - PT/OT - PT recommending rehab, TOC consulted for placement  Chest pain - Atypical - Troponin x 2 negative, EKG without ST elevations  Chronic pain - Patient complains of pain all over. Work up for acute pathology as above - 1/8 Initiated Cymbalta  for chronic pain management.  Start at 30 mg daily and titrate up as patient tolerates  Anxiety - Pt was Evaluated overnight on 1/9  and was given Ativan  due to concerns of withdrawal.  Patient has been admitted for  2 weeks.  EtOH withdrawal unlikely at this time.  UDS on arrival positive only for cocaine.  Suspect anxiety or panic attack more likely. -- PRN ativan        DVT prophylaxis: Lovenox    Code Status: Full Code Family Communication: none at bedside Disposition:  Status is: Inpatient, medically cleared. pending rehab placement    Consultants:  Infectious disease Procedures:  N/a  Antimicrobials:  Anti-infectives (From admission, onward)    Start     Dose/Rate Route Frequency Ordered Stop   12/19/23 1400  azithromycin  (ZITHROMAX ) tablet 1,200 mg        1,200 mg Oral Weekly 12/18/23 1346     12/18/23 1000  sulfamethoxazole -trimethoprim  (BACTRIM  DS) 800-160 MG per tablet 1 tablet        1 tablet Oral Daily 12/17/23 1621     12/14/23 1530  bictegravir-emtricitabine -tenofovir  AF (BIKTARVY ) 50-200-25 MG per tablet 1 tablet        1 tablet Oral Daily 12/14/23 1442     12/12/23 1000  amoxicillin -clavulanate (AUGMENTIN ) 875-125 MG per tablet 1 tablet        1 tablet Oral Every 12 hours 12/11/23 1345 12/12/23 2131  12/12/23 1000  azithromycin  (ZITHROMAX ) tablet 1,250 mg  Status:  Discontinued        1,200 mg Oral Weekly 12/11/23 1618 12/18/23 1346   12/11/23 2000  sulfamethoxazole -trimethoprim  (BACTRIM ) 400-80 MG per tablet 1 tablet  Status:  Discontinued        1 tablet Oral Daily 12/11/23 1618 12/17/23 1621   12/09/23 2200  azithromycin  (ZITHROMAX ) 500 mg in sodium chloride  0.9 % 250 mL IVPB  Status:  Discontinued        500 mg 250 mL/hr over 60 Minutes Intravenous Every 24 hours 12/09/23 0734 12/11/23 1618   12/09/23 2000  cefTRIAXone  (ROCEPHIN ) 2 g in sodium chloride  0.9 % 100 mL IVPB  Status:  Discontinued        2 g 200 mL/hr over 30 Minutes Intravenous Every 24 hours 12/09/23 0953 12/09/23 1840   12/09/23 2000  piperacillin -tazobactam (ZOSYN ) IVPB 3.375 g        3.375 g 12.5 mL/hr over 240 Minutes  Intravenous Every 8 hours 12/09/23 1850 12/12/23 0037   12/09/23 0600  Ampicillin -Sulbactam (UNASYN ) 3 g in sodium chloride  0.9 % 100 mL IVPB  Status:  Discontinued        3 g 200 mL/hr over 30 Minutes Intravenous Every 6 hours 12/09/23 0442 12/09/23 0953   12/08/23 2215  cefTRIAXone  (ROCEPHIN ) 2 g in sodium chloride  0.9 % 100 mL IVPB        2 g 200 mL/hr over 30 Minutes Intravenous Once 12/08/23 2214 12/08/23 2308   12/08/23 2215  azithromycin  (ZITHROMAX ) 500 mg in sodium chloride  0.9 % 250 mL IVPB        500 mg 250 mL/hr over 60 Minutes Intravenous  Once 12/08/23 2214 12/09/23 0000       Data Reviewed: I have personally reviewed following labs and imaging studies CBC: Recent Labs  Lab 12/16/23 1229 12/18/23 0830 12/20/23 0401 12/21/23 0617 12/22/23 0559  WBC 4.7 4.1 5.5 3.9* 4.8  NEUTROABS  --   --  3.9 2.4 3.1  HGB 8.6* 9.1* 8.4* 9.1* 9.2*  HCT 27.4* 29.8* 27.3* 29.3* 28.9*  MCV 86.2 85.9 86.9 86.4 83.5  PLT 231 311 315 346 382   Basic Metabolic Panel: Recent Labs  Lab 12/16/23 1229 12/18/23 0716 12/20/23 0401 12/21/23 0617 12/22/23 0559  NA 144 145 143 140 137  K 3.9 4.5 4.6 4.6 4.6  CL 112* 111 109 106 103  CO2 23 23 25 25 24   GLUCOSE 126* 86 91 77 78  BUN 18 18 22* 19 20  CREATININE 0.73 0.80 0.98 0.96 0.93  CALCIUM  8.2* 8.9 9.0 9.5 9.6  MG  --   --  2.3 2.2 2.2  PHOS  --   --  5.0* 5.1* 5.2*   GFR: Estimated Creatinine Clearance: 58.2 mL/min (by C-G formula based on SCr of 0.93 mg/dL). Liver Function Tests: Recent Labs  Lab 12/16/23 1229 12/20/23 0401 12/21/23 0617 12/22/23 0559  AST 43* 33 26 22  ALT 33 35 31 27  ALKPHOS 38 53 52 59  BILITOT 0.3 0.4 0.5 0.4  PROT 6.8 7.6 7.9 8.2*  ALBUMIN 2.5* 2.9* 3.0* 3.0*   CBG: Recent Labs  Lab 12/16/23 2053  GLUCAP 103*    Recent Results (from the past 240 hours)  Acid Fast Smear (AFB)     Status: None   Collection Time: 12/13/23 10:07 AM   Specimen: Vein; Blood  Result Value Ref Range Status    AFB Specimen Processing OLDAGE  Final  Comment: (NOTE) Test not performed due to the age of this specimen. Notified your facility via email on 01/08 / 01/09.    Acid Fast Smear NOT PERFORMED  Final    Comment: (NOTE) Test not performed Performed At: [facility] [address] [place], [place] [id number] Jennette Shorter MD Ey:[id number]    Source (AFB) AFSMRL  Final    Comment: Performed at [facility], [address] [place], [place] [address]  Acid Fast Culture with reflexed sensitivities     Status: None   Collection Time: 12/13/23 10:07 AM   Specimen: Vein; Blood  Result Value Ref Range Status   Acid Fast Culture OLDAGE  Final    Comment: (NOTE) Test not performed due to the age of this specimen. Notified your facility via email on 01/08 / 01/09. Performed At: [facility] [address] [place], [place] [id number] Jennette Shorter MD Ey:[id number]    Source of Sample AFSCUL  Final    Comment: Performed at [facility], [address] [place] [place], [place] [address]      Radiology Studies: MR Lumbar Spine W Wo Contrast Result Date: 12/21/2023 CLINICAL DATA:  Initial evaluation for low back pain, radiculopathy. Infection suspected. EXAM: MRI LUMBAR SPINE WITHOUT AND WITH CONTRAST TECHNIQUE: Multiplanar and multiecho pulse sequences of the lumbar spine were obtained without and with intravenous contrast. CONTRAST:  5mL GADAVIST  GADOBUTROL  1 MMOL/ML IV SOLN COMPARISON:  Prior radiograph from 12/06/2023 FINDINGS: Segmentation: Standard. Lowest well-formed disc space labeled the L5-S1 level. Alignment: 2 mm facet mediated anterolisthesis of L4 on L5. Alignment otherwise normal preservation of the normal lumbar lordosis. Vertebrae: Vertebral body height maintained without acute or chronic fracture. Bone marrow signal intensity within normal limits. No worrisome osseous lesions. Reactive marrow edema and enhancement present about the L4-5 facets, likely  due to degenerative facet arthritis. Associated small joint effusions. Mild degenerative reactive endplate changes present about the L4-5 interspace as well. Conus medullaris and cauda equina: Conus extends to the T12-L1 level. Conus and cauda equina appear normal. Paraspinal and other soft tissues: Diffuse edema within the subcutaneous fat of the lower back noted, likely related to overall volume status. No other acute finding. Disc levels: L1-2: Normal interspace. Mild bilateral facet hypertrophy. No stenosis. L2-3: Tiny left foraminal disc protrusion with annular fissure (series 1, image 10). Mild bilateral facet hypertrophy. No spinal stenosis. Foramina remain adequately patent. L3-4: Minimal disc bulge. Right foraminal annular fissure. Mild bilateral facet hypertrophy. No significant spinal stenosis. Foramina remain patent. L4-5: 2 mm anterolisthesis. Mild diffuse disc bulge. Moderate bilateral facet arthrosis with associated reactive edema and small joint effusions. Mild to moderate bilateral subarticular stenosis. Central canal remains patent. Mild to moderate bilateral L4 foraminal narrowing. L5-S1: Normal interspace. Mild right worse than left facet hypertrophy. No spinal stenosis. Mild right foraminal narrowing. Left neural foramina remains patent. IMPRESSION: 1. Moderate bilateral facet arthrosis at L4-5 with associated reactive marrow edema and small joint effusions. Findings are favored to be degenerative in nature, although changes of septic arthritis are difficult to exclude, and could be considered in the correct clinical setting. If the clinical picture is equivocal for acute lower back infection, a short interval follow-up exam to evaluate for potential interval change may be helpful for further evaluation. 2. Mild to moderate bilateral subarticular and foraminal stenosis at L4-5. 3. Mild right foraminal narrowing at L5-S1 related to facet hypertrophy. 4. Additional minor degenerative changes  elsewhere within the lumbar spine as above. No other significant stenosis or  impingement. Electronically Signed   By: Morene Hoard M.D.   On: 12/21/2023 23:45   MR THORACIC SPINE W WO CONTRAST Result Date: 12/21/2023 CLINICAL DATA:  Initial evaluation for ataxia, mid back pain. EXAM: MRI THORACIC WITHOUT AND WITH CONTRAST TECHNIQUE: Multiplanar and multiecho pulse sequences of the thoracic spine were obtained without and with intravenous contrast. CONTRAST:  5mL GADAVIST  GADOBUTROL  1 MMOL/ML IV SOLN COMPARISON:  None Available. FINDINGS: Alignment: Physiologic with preservation of the normal thoracic kyphosis. No listhesis. Vertebrae: Vertebral body height maintained without acute or chronic fracture. Bone marrow signal intensity within normal limits. No worrisome osseous lesions. No abnormal marrow edema or enhancement. Cord:  Normal signal and morphology.  No abnormal enhancement. Paraspinal and other soft tissues: Unremarkable. Disc levels: Degenerative spondylosis noted within the partially visualized cervical spine. T1-2:  Unremarkable. T2-3: Unremarkable. T3-4:  Unremarkable. T4-5:  Unremarkable. T5-6: Normal interspace. Mild right-sided facet hypertrophy. No stenosis. T6-7: Normal interspace. Mild bilateral facet hypertrophy. No stenosis. T7-8:  Normal interspace.  Minimal facet hypertrophy.  No stenosis. T8-9: Minimal reactive endplate change without significant disc bulge. Mild to moderate facet hypertrophy with associated small joint effusions. No spinal stenosis. Mild bilateral foraminal narrowing. T9-10: Normal interspace. Mild facet hypertrophy. No significant stenosis. T10-11:  Normal interspace.  No significant stenosis. T11-12: Minimal annular disc bulge. Mild to moderate right greater than left facet hypertrophy. No spinal stenosis. Mild bilateral foraminal narrowing. T12-L1:  Unremarkable. IMPRESSION: 1. Normal MRI appearance of the thoracic spinal cord. No findings to explain patient's  symptoms identified. 2. Mild to moderate multilevel facet hypertrophy throughout the thoracic spine as above, most pronounced at T8-9 bilaterally. Findings could contribute to back pain. Secondary mild bilateral foraminal narrowing at T8-9 and T11-12. 3. No significant disc pathology within the thoracic spine for age. No spinal stenosis. Electronically Signed   By: Morene Hoard M.D.   On: 12/21/2023 23:30   DG Chest Port 1 View Result Date: 12/20/2023 CLINICAL DATA:  Collapsed lung. EXAM: PORTABLE CHEST 1 VIEW COMPARISON:  CT abdomen 12/17/2023 and chest radiograph 12/08/2023 FINDINGS: Markedly improved aeration in the right lung compared to previous chest radiograph. There is no significant lung collapse on this single view. Lucency along the lateral aspect of the right chest appears to be related to overlying structures and no definite pneumothorax. No focal airspace disease or lung consolidation. There may be hyperinflation of the right lung compared to the left of uncertain etiology. Heart size is normal. Curvature in the thoracic spine may be related to patient's positioning. IMPRESSION: No significant lung consolidation or collapse on this single view. There may be some hyperinflation in the right lung but this finding could also be associated with patient positioning. Electronically Signed   By: Juliene Balder M.D.   On: 12/20/2023 14:01     Scheduled Meds:  azithromycin   1,200 mg Oral Weekly   bictegravir-emtricitabine -tenofovir  AF  1 tablet Oral Daily   DULoxetine   30 mg Oral QHS   enoxaparin  (LOVENOX ) injection  40 mg Subcutaneous QHS   feeding supplement  237 mL Oral TID BM   folic acid   1 mg Oral Daily   midodrine   10 mg Oral TID WC   multivitamin with minerals  1 tablet Oral QHS   pantoprazole   40 mg Oral BID   polyethylene glycol  17 g Oral Daily   sulfamethoxazole -trimethoprim   1 tablet Oral Daily   thiamine   100 mg Oral Daily   Continuous Infusions:   LOS: 14 days  Time  spent:   Rayquon Uselman, DO [facility]  To contact the attending physician between 7A-7P please use Epic Chat. To contact the covering physician during after hours 7P-7A, please review Amion.   12/22/2023, 8:31 AM   *This document has been created with the assistance of dictation software. Please excuse typographical errors. *

## 2023-12-23 ENCOUNTER — Inpatient Hospital Stay: Payer: MEDICAID

## 2023-12-23 DIAGNOSIS — B20 Human immunodeficiency virus [HIV] disease: Secondary | ICD-10-CM | POA: Diagnosis not present

## 2023-12-23 DIAGNOSIS — R1011 Right upper quadrant pain: Secondary | ICD-10-CM | POA: Diagnosis not present

## 2023-12-23 DIAGNOSIS — F1410 Cocaine abuse, uncomplicated: Secondary | ICD-10-CM | POA: Diagnosis not present

## 2023-12-23 LAB — COMPREHENSIVE METABOLIC PANEL
ALT: 31 U/L (ref 0–44)
AST: 32 U/L (ref 15–41)
Albumin: 3.5 g/dL (ref 3.5–5.0)
Alkaline Phosphatase: 71 U/L (ref 38–126)
Anion gap: 13 (ref 5–15)
BUN: 25 mg/dL — ABNORMAL HIGH (ref 6–20)
CO2: 23 mmol/L (ref 22–32)
Calcium: 9.9 mg/dL (ref 8.9–10.3)
Chloride: 99 mmol/L (ref 98–111)
Creatinine, Ser: 1.13 mg/dL — ABNORMAL HIGH (ref 0.44–1.00)
GFR, Estimated: 60 mL/min — ABNORMAL LOW (ref >60)
Glucose, Bld: 97 mg/dL (ref 70–99)
Potassium: 4.7 mmol/L (ref 3.5–5.1)
Sodium: 135 mmol/L (ref 135–145)
Total Bilirubin: 0.6 mg/dL (ref 0.0–1.2)
Total Protein: 9.1 g/dL — ABNORMAL HIGH (ref 6.5–8.1)

## 2023-12-23 LAB — CBC WITH DIFFERENTIAL/PLATELET
Abs Immature Granulocytes: 0.01 10*3/uL (ref 0.00–0.07)
Basophils Absolute: 0.1 10*3/uL (ref 0.0–0.1)
Basophils Relative: 1 %
Eosinophils Absolute: 0.4 10*3/uL (ref 0.0–0.5)
Eosinophils Relative: 7 %
HCT: 30.7 % — ABNORMAL LOW (ref 36.0–46.0)
Hemoglobin: 9.8 g/dL — ABNORMAL LOW (ref 12.0–15.0)
Immature Granulocytes: 0 %
Lymphocytes Relative: 16 %
Lymphs Abs: 0.9 10*3/uL (ref 0.7–4.0)
MCH: 26.8 pg (ref 26.0–34.0)
MCHC: 31.9 g/dL (ref 30.0–36.0)
MCV: 84.1 fL (ref 80.0–100.0)
Monocytes Absolute: 0.6 10*3/uL (ref 0.1–1.0)
Monocytes Relative: 10 %
Neutro Abs: 3.8 10*3/uL (ref 1.7–7.7)
Neutrophils Relative %: 66 %
Platelets: 393 10*3/uL (ref 150–400)
RBC: 3.65 MIL/uL — ABNORMAL LOW (ref 3.87–5.11)
RDW: 15 % (ref 11.5–15.5)
WBC: 5.8 10*3/uL (ref 4.0–10.5)
nRBC: 0 % (ref 0.0–0.2)

## 2023-12-23 LAB — PHOSPHORUS: Phosphorus: 5.3 mg/dL — ABNORMAL HIGH (ref 2.5–4.6)

## 2023-12-23 LAB — MISC LABCORP TEST (SEND OUT): Labcorp test code: 164319

## 2023-12-23 LAB — MAGNESIUM: Magnesium: 2.3 mg/dL (ref 1.7–2.4)

## 2023-12-23 MED ORDER — IOHEXOL 300 MG/ML  SOLN
75.0000 mL | Freq: Once | INTRAMUSCULAR | Status: AC | PRN
  Administered 2023-12-23: 75 mL via INTRAVENOUS

## 2023-12-23 NOTE — Progress Notes (Signed)
 New wound noted,, open wound to sacral area with bleeding. Clean site with NS, applied mepilex dressing, entered wound consult.   12/23/23 1738  Wound / Incision (Open or Dehisced) 12/23/23 Other (Comment) Sacrum Lower open skin area with redness, bleeding on sacrum  Date First Assessed/Time First Assessed: 12/23/23 1800   Wound Type: (c) Other (Comment)  Location: Sacrum  Location Orientation: Lower  Wound Description (Comments): open skin area with redness, bleeding on sacrum  Present on Admission: No  Dressing Type Foam - Lift dressing to assess site every shift (Mepilex applied to sacral wound)  Dressing Changed New  Dressing Status Clean, Dry, Intact  Dressing Change Frequency PRN  Site / Wound Assessment Bleeding;Red;Painful  Drainage Amount Moderate  Drainage Description No odor;Other (Comment) (bleeding)

## 2023-12-23 NOTE — TOC Progression Note (Signed)
 Transition of Care [facility]) - Progression Note    Patient Details  Name: HARTLEE AMEDEE MRN: [id number] Date of Birth: 10/28/1976  [facility] CM/SW Contact  Odella Ku, RN Phone Number: 12/23/2023, 3:07 PM  Clinical Narrative:     MD to place psych consult tomorrow, will proceed with placement once evaluation is done.        Expected Discharge Plan and Services                                               Social Determinants of Health (SDOH) Interventions SDOH Screenings   Food Insecurity: Patient Unable To Answer (12/09/2023)  Recent Concern: Food Insecurity - Food Insecurity Present (09/26/2023)  Housing: Patient Unable To Answer (12/09/2023)  Recent Concern: Housing - Medium Risk (09/26/2023)  Transportation Needs: Patient Unable To Answer (12/09/2023)  Recent Concern: Transportation Needs - Unmet Transportation Needs (10/11/2023)  Utilities: Patient Unable To Answer (12/09/2023)  Recent Concern: Utilities - At Risk (09/25/2023)  Tobacco Use: High Risk (12/08/2023)    Readmission Risk Interventions     No data to display

## 2023-12-23 NOTE — Progress Notes (Addendum)
 PROGRESS NOTE    Meghan Jarvis  FMW:[id number] DOB: November 24, 1976 DOA: 12/08/2023 [name]  Chief Complaint  Patient presents with   Altered Mental Status    Hospital Course:  Meghan Jarvis is 48 y.o. female with history of alcohol abuse, cocaine abuse, homelessness, HIV/AIDS, atrial fibrillation, hypothyroidism, type 2 diabetes, who presented to [facility] with acute respiratory failure.  Reportedly she was found down by a friend who would not wake up.  She was hypoxic in the 70s on arrival. She was intubated and admitted by the critical care service.  She was started on antibiotics for presumed aspiration pneumonia.  Urine tox positive for cocaine.  Patient was successfully extubated on 12/30 and transferred to [facility] on 1/1.  Patient's stay has then been prolonged pending rehab placement and complaints of various pain.  Did complain of chest pain but cardiac enzymes were negative x 2 and EKG did not reveal any ST elevations.  She also complained of abdominal pain, right upper quadrant ultrasound negative.  She complained of a headache, MRI brain negative for any toxoplasmosis lesions but did show advanced cerebral atrophy.  L1/5 patient had some hypotension which was responsive to 2 fluid boluses and midodrine .  Patient continued to complain of abdominal pain on 1/6 that she underwent CT scan which was negative for acute process and in fact revealed better aeration of right lower lung.  It did reveal some constipation so she was started on bowel regimen.  Subjective: Fever 100.8 overnight, persistent R Flank pain today. Yelling in pain.    Objective: Vitals:   12/22/23 1610 12/22/23 1847 12/22/23 2118 12/23/23 0418  BP: (!) 89/68 94/68 103/74 106/79  Pulse: 96 87 92 (!) 103  Resp: 18 18 16 16   Temp: (!) 100.8 F (38.2 C) 99.6 F (37.6 C) 98.9 F (37.2 C) 99.1 F (37.3 C)  TempSrc:   Oral Oral  SpO2: 99% 99% 100% 97%  Weight:      Height:       No intake or  output data in the 24 hours ending 12/23/23 0749  Filed Weights   12/20/23 0343 12/20/23 2128 12/22/23 0500  Weight: 50.5 kg 52.5 kg 50.4 kg    Examination: General exam: Sitting upright in bed, eating breakfast. NAD.   Respiratory system: No work of breathing, symmetric chest wall expansion Cardiovascular system: S1 & S2 heard, RRR.  Gastrointestinal system: Exquisite Right-sided flank tenderness to palpation. Neuro: Alert and oriented. Extremities: Symmetric Skin: rashes and lesions in various stages of healing, some surrounding excoriations. Psychiatry: calm, no agitation.   Assessment & Plan:  Principal Problem:   Abdominal pain Active Problems:   Hypotension   Acute hypoxic respiratory failure (HCC)   Severe sepsis (HCC)   Pneumonia of right lower lobe due to infectious organism   Acute metabolic encephalopathy   AIDS (HCC)   Protein-calorie malnutrition, severe   Pancytopenia (HCC)   Headache   Cocaine abuse (HCC)   Right upper quadrant abdominal pain   Alcohol abuse   Uncontrolled type 2 diabetes mellitus with hypoglycemia, without long-term current use of insulin  (HCC)   Chest pain   Generalized weakness   Diarrhea   Underweight (BMI < 18.5)  New Fever - x1, UA ordered, pending - CTAP ordered - If fever recurs will panculture  Acute hypoxic respiratory failure, resolved - Secondary to drug overdose and aspiration pneumonia - Initially managed in ICU on ventilator - Successfully extubated 12/30 - Now on room air -  Has completed antibiotic course - Monitor respiratory status closely  Fall -Did not hit her head - Still with profound weakness and difficulty with ambulation.  - thoracic and lumbar MRI without acute pathology -- Weakness likely 2/2 to HAND  Hypotension - Continue midodrine , MAP goal above 65  Severe sepsis - On admission: Tachycardia, tachypnea, fever, pneumonia. - Secondary to aspiration pneumonia as above - Status post  antibiotics  Acute metabolic encephalopathy - Has improved some, at this point I suspect she is at her baseline.  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 - Initial presentation likely secondary to severe sepsis as above - MRI negative for acute pathology, does show severe cerebral atrophy - Continue frequent reorientation  Aspiration pneumonia - Status post 5 days antibiotics as above  AIDS HIV - Viral load 2 million, CD4: 14 - ID consulted - Currently on PCP and MAI prophylaxis with Bactrim  and Zithromax  - Toxoplasma IgG came back elevated, MRI of the brain does not show any enhancing lesions -- Fungitell +, culture neg -- AFB still pending - Patient has been initiated on Biktarvy  - Will need close outpatient follow-up -- Keep universal precautions  Headache - Resolved - Required IV magnesium  earlier in hospital course - Cryptococcus antigen negative  Pancytopenia - Secondary to HIV as above - Platelet count now within normal range - Continue to trend CBC  Protein calorie malnutrition, severe BMI under 18 - Continue supplements - Nutrition consult  Cocaine abuse - Has been counseled on cessation  Alcohol abuse - Now out of withdrawal window - Continue with thiamine , multivitamin, folic acid  supplementation  Controlled type 2 diabetes with hyperglycemia, without long-term use of insulin  - Hemoglobin A1c 6.0%  AKI - acute worsening today may be 2/2 to fever and brief hypotension overnight. - CMP in AM  Abdominal pain - Persistent Right flank pain very tender to palpation, initial scans negative. Repeat today. - Continue bowel regimen - Right upper quadrant ultrasound and CT scan negative except for constipation 1/6  Constipation - Continue bowel regimen as above  Generalized weakness Frequent falls - Secondary to all above conditions - PT/OT - PT recommending rehab, TOC consulted for placement  Chest pain - Atypical - Troponin x 2  negative, EKG without ST elevations  Chronic pain - Patient complains of pain all over. Work up for acute pathology as above - 1/8 Initiated Cymbalta  for chronic pain management.  Start at 30 mg daily and titrate up as patient tolerates  Anxiety - Pt was Evaluated overnight on 1/9 and was given Ativan  due to concerns of withdrawal.  Patient has been admitted for  2 weeks.  EtOH withdrawal unlikely at this time.  UDS on arrival positive only for cocaine.  Suspect anxiety or panic attack more likely. -- PRN ativan        DVT prophylaxis: Lovenox    Code Status: Full Code Family Communication: none at bedside Disposition:  Status is: Inpatient, medically cleared. pending rehab placement    Consultants:  Infectious disease Procedures:  N/a  Antimicrobials:  Anti-infectives (From admission, onward)    Start     Dose/Rate Route Frequency Ordered Stop   12/19/23 1400  azithromycin  (ZITHROMAX ) tablet 1,200 mg        1,200 mg Oral Weekly 12/18/23 1346     12/18/23 1000  sulfamethoxazole -trimethoprim  (BACTRIM  DS) 800-160 MG per tablet 1 tablet        1 tablet Oral Daily 12/17/23 1621     12/14/23 1530  bictegravir-emtricitabine -tenofovir  AF (BIKTARVY ) 50-200-25 MG  per tablet 1 tablet        1 tablet Oral Daily 12/14/23 1442     12/12/23 1000  amoxicillin -clavulanate (AUGMENTIN ) 875-125 MG per tablet 1 tablet        1 tablet Oral Every 12 hours 12/11/23 1345 12/12/23 2131   12/12/23 1000  azithromycin  (ZITHROMAX ) tablet 1,250 mg  Status:  Discontinued        1,200 mg Oral Weekly 12/11/23 1618 12/18/23 1346   12/11/23 2000  sulfamethoxazole -trimethoprim  (BACTRIM ) 400-80 MG per tablet 1 tablet  Status:  Discontinued        1 tablet Oral Daily 12/11/23 1618 12/17/23 1621   12/09/23 2200  azithromycin  (ZITHROMAX ) 500 mg in sodium chloride  0.9 % 250 mL IVPB  Status:  Discontinued        500 mg 250 mL/hr over 60 Minutes Intravenous Every 24 hours 12/09/23 0734 12/11/23 1618   12/09/23 2000   cefTRIAXone  (ROCEPHIN ) 2 g in sodium chloride  0.9 % 100 mL IVPB  Status:  Discontinued        2 g 200 mL/hr over 30 Minutes Intravenous Every 24 hours 12/09/23 0953 12/09/23 1840   12/09/23 2000  piperacillin -tazobactam (ZOSYN ) IVPB 3.375 g        3.375 g 12.5 mL/hr over 240 Minutes Intravenous Every 8 hours 12/09/23 1850 12/12/23 0037   12/09/23 0600  Ampicillin -Sulbactam (UNASYN ) 3 g in sodium chloride  0.9 % 100 mL IVPB  Status:  Discontinued        3 g 200 mL/hr over 30 Minutes Intravenous Every 6 hours 12/09/23 0442 12/09/23 0953   12/08/23 2215  cefTRIAXone  (ROCEPHIN ) 2 g in sodium chloride  0.9 % 100 mL IVPB        2 g 200 mL/hr over 30 Minutes Intravenous Once 12/08/23 2214 12/08/23 2308   12/08/23 2215  azithromycin  (ZITHROMAX ) 500 mg in sodium chloride  0.9 % 250 mL IVPB        500 mg 250 mL/hr over 60 Minutes Intravenous  Once 12/08/23 2214 12/09/23 0000       Data Reviewed: I have personally reviewed following labs and imaging studies CBC: Recent Labs  Lab 12/18/23 0830 12/20/23 0401 12/21/23 0617 12/22/23 0559 12/23/23 0538  WBC 4.1 5.5 3.9* 4.8 5.8  NEUTROABS  --  3.9 2.4 3.1 3.8  HGB 9.1* 8.4* 9.1* 9.2* 9.8*  HCT 29.8* 27.3* 29.3* 28.9* 30.7*  MCV 85.9 86.9 86.4 83.5 84.1  PLT 311 315 346 382 393   Basic Metabolic Panel: Recent Labs  Lab 12/18/23 0716 12/20/23 0401 12/21/23 0617 12/22/23 0559 12/23/23 0538  NA 145 143 140 137 135  K 4.5 4.6 4.6 4.6 4.7  CL 111 109 106 103 99  CO2 23 25 25 24 23   GLUCOSE 86 91 77 78 97  BUN 18 22* 19 20 25*  CREATININE 0.80 0.98 0.96 0.93 1.13*  CALCIUM  8.9 9.0 9.5 9.6 9.9  MG  --  2.3 2.2 2.2 2.3  PHOS  --  5.0* 5.1* 5.2* 5.3*   GFR: Estimated Creatinine Clearance: 47.9 mL/min (A) (by C-G formula based on SCr of 1.13 mg/dL (H)). Liver Function Tests: Recent Labs  Lab 12/16/23 1229 12/20/23 0401 12/21/23 0617 12/22/23 0559 12/23/23 0538  AST 43* 33 26 22 32  ALT 33 35 31 27 31   ALKPHOS 38 53 52 59 71   BILITOT 0.3 0.4 0.5 0.4 0.6  PROT 6.8 7.6 7.9 8.2* 9.1*  ALBUMIN 2.5* 2.9* 3.0* 3.0* 3.5   CBG: Recent Labs  Lab 12/16/23 2053  GLUCAP 103*    Recent Results (from the past 240 hours)  Acid Fast Smear (AFB)     Status: None   Collection Time: 12/13/23 10:07 AM   Specimen: Vein; Blood  Result Value Ref Range Status   AFB Specimen Processing OLDAGE  Final    Comment: (NOTE) Test not performed due to the age of this specimen. Notified your facility via email on 01/08 / 01/09.    Acid Fast Smear NOT PERFORMED  Final    Comment: (NOTE) Test not performed Performed At: [facility] [address] [place], [place] [id number] Jennette Shorter MD Ey:[id number]    Source (AFB) AFSMRL  Final    Comment: Performed at [facility], [address] [place], [place] [address]  Acid Fast Culture with reflexed sensitivities     Status: None   Collection Time: 12/13/23 10:07 AM   Specimen: Vein; Blood  Result Value Ref Range Status   Acid Fast Culture OLDAGE  Final    Comment: (NOTE) Test not performed due to the age of this specimen. Notified your facility via email on 01/08 / 01/09. Performed At: [facility] [address] [place], [place] [id number] Jennette Shorter MD Ey:[id number]    Source of Sample AFSCUL  Final    Comment: Performed at [facility], [address] [place] [place], [place] [address]      Radiology Studies: MR Lumbar Spine W Wo Contrast Result Date: 12/21/2023 CLINICAL DATA:  Initial evaluation for low back pain, radiculopathy. Infection suspected. EXAM: MRI LUMBAR SPINE WITHOUT AND WITH CONTRAST TECHNIQUE: Multiplanar and multiecho pulse sequences of the lumbar spine were obtained without and with intravenous contrast. CONTRAST:  5mL GADAVIST  GADOBUTROL  1 MMOL/ML IV SOLN COMPARISON:  Prior radiograph from 12/06/2023 FINDINGS: Segmentation: Standard. Lowest well-formed disc space labeled the L5-S1 level. Alignment: 2 mm facet mediated  anterolisthesis of L4 on L5. Alignment otherwise normal preservation of the normal lumbar lordosis. Vertebrae: Vertebral body height maintained without acute or chronic fracture. Bone marrow signal intensity within normal limits. No worrisome osseous lesions. Reactive marrow edema and enhancement present about the L4-5 facets, likely due to degenerative facet arthritis. Associated small joint effusions. Mild degenerative reactive endplate changes present about the L4-5 interspace as well. Conus medullaris and cauda equina: Conus extends to the T12-L1 level. Conus and cauda equina appear normal. Paraspinal and other soft tissues: Diffuse edema within the subcutaneous fat of the lower back noted, likely related to overall volume status. No other acute finding. Disc levels: L1-2: Normal interspace. Mild bilateral facet hypertrophy. No stenosis. L2-3: Tiny left foraminal disc protrusion with annular fissure (series 1, image 10). Mild bilateral facet hypertrophy. No spinal stenosis. Foramina remain adequately patent. L3-4: Minimal disc bulge. Right foraminal annular fissure. Mild bilateral facet hypertrophy. No significant spinal stenosis. Foramina remain patent. L4-5: 2 mm anterolisthesis. Mild diffuse disc bulge. Moderate bilateral facet arthrosis with associated reactive edema and small joint effusions. Mild to moderate bilateral subarticular stenosis. Central canal remains patent. Mild to moderate bilateral L4 foraminal narrowing. L5-S1: Normal interspace. Mild right worse than left facet hypertrophy. No spinal stenosis. Mild right foraminal narrowing. Left neural foramina remains patent. IMPRESSION: 1. Moderate bilateral facet arthrosis at L4-5 with associated reactive marrow edema and small joint effusions. Findings are favored to be degenerative in nature, although changes of septic arthritis are difficult to exclude, and could be considered in the correct clinical setting. If the clinical picture is equivocal for  acute lower back infection,  a short interval follow-up exam to evaluate for potential interval change may be helpful for further evaluation. 2. Mild to moderate bilateral subarticular and foraminal stenosis at L4-5. 3. Mild right foraminal narrowing at L5-S1 related to facet hypertrophy. 4. Additional minor degenerative changes elsewhere within the lumbar spine as above. No other significant stenosis or impingement. Electronically Signed   By: Morene Hoard M.D.   On: 12/21/2023 23:45   MR THORACIC SPINE W WO CONTRAST Result Date: 12/21/2023 CLINICAL DATA:  Initial evaluation for ataxia, mid back pain. EXAM: MRI THORACIC WITHOUT AND WITH CONTRAST TECHNIQUE: Multiplanar and multiecho pulse sequences of the thoracic spine were obtained without and with intravenous contrast. CONTRAST:  5mL GADAVIST  GADOBUTROL  1 MMOL/ML IV SOLN COMPARISON:  None Available. FINDINGS: Alignment: Physiologic with preservation of the normal thoracic kyphosis. No listhesis. Vertebrae: Vertebral body height maintained without acute or chronic fracture. Bone marrow signal intensity within normal limits. No worrisome osseous lesions. No abnormal marrow edema or enhancement. Cord:  Normal signal and morphology.  No abnormal enhancement. Paraspinal and other soft tissues: Unremarkable. Disc levels: Degenerative spondylosis noted within the partially visualized cervical spine. T1-2:  Unremarkable. T2-3: Unremarkable. T3-4:  Unremarkable. T4-5:  Unremarkable. T5-6: Normal interspace. Mild right-sided facet hypertrophy. No stenosis. T6-7: Normal interspace. Mild bilateral facet hypertrophy. No stenosis. T7-8:  Normal interspace.  Minimal facet hypertrophy.  No stenosis. T8-9: Minimal reactive endplate change without significant disc bulge. Mild to moderate facet hypertrophy with associated small joint effusions. No spinal stenosis. Mild bilateral foraminal narrowing. T9-10: Normal interspace. Mild facet hypertrophy. No significant  stenosis. T10-11:  Normal interspace.  No significant stenosis. T11-12: Minimal annular disc bulge. Mild to moderate right greater than left facet hypertrophy. No spinal stenosis. Mild bilateral foraminal narrowing. T12-L1:  Unremarkable. IMPRESSION: 1. Normal MRI appearance of the thoracic spinal cord. No findings to explain patient's symptoms identified. 2. Mild to moderate multilevel facet hypertrophy throughout the thoracic spine as above, most pronounced at T8-9 bilaterally. Findings could contribute to back pain. Secondary mild bilateral foraminal narrowing at T8-9 and T11-12. 3. No significant disc pathology within the thoracic spine for age. No spinal stenosis. Electronically Signed   By: Morene Hoard M.D.   On: 12/21/2023 23:30     Scheduled Meds:  azithromycin   1,200 mg Oral Weekly   bictegravir-emtricitabine -tenofovir  AF  1 tablet Oral Daily   DULoxetine   30 mg Oral QHS   enoxaparin  (LOVENOX ) injection  40 mg Subcutaneous QHS   feeding supplement  237 mL Oral TID BM   folic acid   1 mg Oral Daily   midodrine   10 mg Oral TID WC   multivitamin with minerals  1 tablet Oral QHS   pantoprazole   40 mg Oral BID   polyethylene glycol  17 g Oral Daily   sulfamethoxazole -trimethoprim   1 tablet Oral Daily   thiamine   100 mg Oral Daily   Continuous Infusions:   LOS: 15 days    Time spent:  55min  Pascuala Klutts, DO [facility]  To contact the attending physician between 7A-7P please use Epic Chat. To contact the covering physician during after hours 7P-7A, please review Amion.   12/23/2023, 7:49 AM   *This document has been created with the assistance of dictation software. Please excuse typographical errors. *

## 2023-12-23 NOTE — Plan of Care (Signed)
 Patient has been up OOB to chair most of the day. She has complained of pain to sacral area. Patient needs 2-person assist when transferring from bed to chair or chair to bed.   Problem: Coping: Goal: Ability to adjust to condition or change in health will improve Outcome: Progressing   Problem: Skin Integrity: Goal: Risk for impaired skin integrity will decrease Outcome: Progressing   Problem: Education: Goal: Knowledge of General Education information will improve Description: Including pain rating scale, medication(s)/side effects and non-pharmacologic comfort measures Outcome: Progressing   Problem: Activity: Goal: Risk for activity intolerance will decrease Outcome: Progressing   Problem: Pain Management: Goal: General experience of comfort will improve Outcome: Progressing   Problem: Safety: Goal: Ability to remain free from injury will improve Outcome: Progressing

## 2023-12-24 DIAGNOSIS — R1011 Right upper quadrant pain: Secondary | ICD-10-CM | POA: Diagnosis not present

## 2023-12-24 DIAGNOSIS — J189 Pneumonia, unspecified organism: Secondary | ICD-10-CM | POA: Diagnosis not present

## 2023-12-24 DIAGNOSIS — Z7689 Persons encountering health services in other specified circumstances: Secondary | ICD-10-CM | POA: Insufficient documentation

## 2023-12-24 DIAGNOSIS — T7840XA Allergy, unspecified, initial encounter: Secondary | ICD-10-CM

## 2023-12-24 DIAGNOSIS — B20 Human immunodeficiency virus [HIV] disease: Secondary | ICD-10-CM | POA: Diagnosis not present

## 2023-12-24 DIAGNOSIS — R21 Rash and other nonspecific skin eruption: Secondary | ICD-10-CM | POA: Insufficient documentation

## 2023-12-24 DIAGNOSIS — F1010 Alcohol abuse, uncomplicated: Secondary | ICD-10-CM | POA: Diagnosis not present

## 2023-12-24 DIAGNOSIS — J9601 Acute respiratory failure with hypoxia: Secondary | ICD-10-CM | POA: Diagnosis not present

## 2023-12-24 LAB — COMPREHENSIVE METABOLIC PANEL
ALT: 27 U/L (ref 0–44)
AST: 33 U/L (ref 15–41)
Albumin: 3.2 g/dL — ABNORMAL LOW (ref 3.5–5.0)
Alkaline Phosphatase: 61 U/L (ref 38–126)
Anion gap: 12 (ref 5–15)
BUN: 38 mg/dL — ABNORMAL HIGH (ref 6–20)
CO2: 23 mmol/L (ref 22–32)
Calcium: 9.4 mg/dL (ref 8.9–10.3)
Chloride: 101 mmol/L (ref 98–111)
Creatinine, Ser: 1.38 mg/dL — ABNORMAL HIGH (ref 0.44–1.00)
GFR, Estimated: 47 mL/min — ABNORMAL LOW (ref >60)
Glucose, Bld: 93 mg/dL (ref 70–99)
Potassium: 4.8 mmol/L (ref 3.5–5.1)
Sodium: 136 mmol/L (ref 135–145)
Total Bilirubin: 0.5 mg/dL (ref 0.0–1.2)
Total Protein: 8.4 g/dL — ABNORMAL HIGH (ref 6.5–8.1)

## 2023-12-24 LAB — CBC WITH DIFFERENTIAL/PLATELET
Abs Immature Granulocytes: 0.01 10*3/uL (ref 0.00–0.07)
Basophils Absolute: 0.1 10*3/uL (ref 0.0–0.1)
Basophils Relative: 1 %
Eosinophils Absolute: 0.6 10*3/uL — ABNORMAL HIGH (ref 0.0–0.5)
Eosinophils Relative: 10 %
HCT: 29.9 % — ABNORMAL LOW (ref 36.0–46.0)
Hemoglobin: 9.6 g/dL — ABNORMAL LOW (ref 12.0–15.0)
Immature Granulocytes: 0 %
Lymphocytes Relative: 19 %
Lymphs Abs: 1 10*3/uL (ref 0.7–4.0)
MCH: 26.7 pg (ref 26.0–34.0)
MCHC: 32.1 g/dL (ref 30.0–36.0)
MCV: 83.1 fL (ref 80.0–100.0)
Monocytes Absolute: 0.5 10*3/uL (ref 0.1–1.0)
Monocytes Relative: 10 %
Neutro Abs: 3.3 10*3/uL (ref 1.7–7.7)
Neutrophils Relative %: 60 %
Platelets: 383 10*3/uL (ref 150–400)
RBC: 3.6 MIL/uL — ABNORMAL LOW (ref 3.87–5.11)
RDW: 15 % (ref 11.5–15.5)
Smear Review: NORMAL
WBC: 5.5 10*3/uL (ref 4.0–10.5)
nRBC: 0 % (ref 0.0–0.2)

## 2023-12-24 LAB — MAGNESIUM: Magnesium: 2.4 mg/dL (ref 1.7–2.4)

## 2023-12-24 LAB — PHOSPHORUS: Phosphorus: 5.3 mg/dL — ABNORMAL HIGH (ref 2.5–4.6)

## 2023-12-24 MED ORDER — DIPHENHYDRAMINE HCL 25 MG PO CAPS
25.0000 mg | ORAL_CAPSULE | Freq: Three times a day (TID) | ORAL | Status: DC | PRN
  Administered 2023-12-24 – 2024-04-20 (×21): 25 mg via ORAL
  Filled 2023-12-24 (×22): qty 1

## 2023-12-24 MED ORDER — SULFAMETHOXAZOLE-TRIMETHOPRIM 400-80 MG PO TABS
1.0000 | ORAL_TABLET | Freq: Every day | ORAL | Status: DC
  Administered 2023-12-24: 1 via ORAL
  Filled 2023-12-24: qty 1

## 2023-12-24 MED ORDER — LACTULOSE 10 GM/15ML PO SOLN
10.0000 g | Freq: Three times a day (TID) | ORAL | Status: DC
  Administered 2023-12-24 – 2023-12-27 (×4): 10 g via ORAL
  Filled 2023-12-24 (×8): qty 30

## 2023-12-24 MED ORDER — ATOVAQUONE 750 MG/5ML PO SUSP
1500.0000 mg | Freq: Every day | ORAL | Status: DC
  Administered 2023-12-25 – 2024-05-07 (×134): 1500 mg via ORAL
  Filled 2023-12-24 (×138): qty 10

## 2023-12-24 MED ORDER — LACTATED RINGERS IV BOLUS
1000.0000 mL | Freq: Once | INTRAVENOUS | Status: AC
  Administered 2023-12-24: 1000 mL via INTRAVENOUS

## 2023-12-24 NOTE — Progress Notes (Signed)
 Date of Admission:  12/08/2023     ID: KYARRA VANCAMP is a 48 y.o. female  Principal Problem:   Abdominal pain Active Problems:   Alcohol abuse   Acute hypoxic respiratory failure (HCC)   Pneumonia of right lower lobe due to infectious organism   Protein-calorie malnutrition, severe   Acute metabolic encephalopathy   Uncontrolled type 2 diabetes mellitus with hypoglycemia, without long-term current use of insulin  (HCC)   Cocaine abuse (HCC)   AIDS (HCC)   Severe sepsis (HCC)   Chest pain   Pancytopenia (HCC)   Generalized weakness   Diarrhea   Right upper quadrant abdominal pain   Headache   Underweight (BMI < 18.5)   Hypotension   Encounter for psychiatric assessment  ? GEROLDINE ESQUIVIAS is a 48 y.o. with a history of AIDS, not compliant with meds or visits ,  , cocaine use was brought in by EMS after being found unresponsive on the couch  in an aquaintance place-   Subjective: Had a few of 100.8 over the weekend Has itching legs and rash   Medications:   azithromycin   1,200 mg Oral Weekly   bictegravir-emtricitabine -tenofovir  AF  1 tablet Oral Daily   DULoxetine   30 mg Oral QHS   enoxaparin  (LOVENOX ) injection  40 mg Subcutaneous QHS   feeding supplement  237 mL Oral TID BM   folic acid   1 mg Oral Daily   lactulose   10 g Oral TID   midodrine   10 mg Oral TID WC   multivitamin with minerals  1 tablet Oral QHS   pantoprazole   40 mg Oral BID   polyethylene glycol  17 g Oral Daily   sulfamethoxazole -trimethoprim   1 tablet Oral Daily   thiamine   100 mg Oral Daily    Objective: Vital signs in last 24 hours: Patient Vitals for the past 24 hrs:  BP Temp Temp src Pulse Resp SpO2 Weight  12/24/23 1046 (!) 89/66 98.2 F (36.8 C) Oral 96 18 98 % --  12/24/23 0621 90/65 98.1 F (36.7 C) -- 97 17 99 % --  12/24/23 0245 -- -- -- -- -- -- 51.1 kg  12/23/23 2303 -- -- -- 99 -- -- --  12/23/23 2247 99/73 98.5 F (36.9 C) -- (!) 108 17 100 % --    Awake and alert Chest  b/l air entry [id number] Skin- erythematous rash over buttocks and thighs and legs- excoriations papular eruption CNS grossly non focal   Lab Results    Latest Ref Rng & Units 12/24/2023    6:19 AM 12/23/2023    5:38 AM 12/22/2023    5:59 AM  CBC  WBC 4.0 - 10.5 K/uL 5.5  5.8  4.8   Hemoglobin 12.0 - 15.0 g/dL 9.6  9.8  9.2   Hematocrit 36.0 - 46.0 % 29.9  30.7  28.9   Platelets 150 - 400 K/uL 383  393  382        Latest Ref Rng & Units 12/24/2023    6:19 AM 12/23/2023    5:38 AM 12/22/2023    5:59 AM  CMP  Glucose 70 - 99 mg/dL 93  97  78   BUN 6 - 20 mg/dL 38  25  20   Creatinine 0.44 - 1.00 mg/dL 8.61  8.86  9.06   Sodium 135 - 145 mmol/L 136  135  137   Potassium 3.5 - 5.1 mmol/L 4.8  4.7  4.6   Chloride 98 - 111  mmol/L 101  99  103   CO2 22 - 32 mmol/L 23  23  24    Calcium  8.9 - 10.3 mg/dL 9.4  9.9  9.6   Total Protein 6.5 - 8.1 g/dL 8.4  9.1  8.2   Total Bilirubin 0.0 - 1.2 mg/dL 0.5  0.6  0.4   Alkaline Phos 38 - 126 U/L 61  71  59   AST 15 - 41 U/L 33  32  22   ALT 0 - 44 U/L 27  31  27        Microbiology: 12/28 BC NG  Toxo IgG > 400 Toxo PCR neg igM neg RPR NR CMV DNA neg Crypto neg HIV RNA 2 million Cd4 is 14 ( 2.4%)     Assessment/Plan: Unresponsive /AMS secondary to cocaine use- resolved   Aspiration leading to acute hypoxic resp failure rt Middle lobe and lower lobe consolidation-- was intubated and mechanically ventilated in the beginning. Now extubated Was On zosyn  Completed rx Repeat CXR clear   Rt sided abdominl flank, chest pain- CT abd ok- MRI spine deg changes- non acute  Rash- likely due to bactrim - DC and start mepron    H/o fall with recurrent ED presentation- had hurt left shoulder before   Left thumb tender swelling- - resolved   Pruritus with clear blisters- resolved- no new one D.D  Cocaine use Insect bite Scabies- no burrows R/o primary dermatological condition like pemphigus ( less likely)  Syphilis- RPR neg   AIDS- non  compliant with meds or visits to her Provider- not been in care since 2021- used to be followed at [facility] before Was on Biktarvy  at one time and was non compliant then VL now is 2 million and cd4 is 11  Started Biktarvy  on 12/14/23 . not sure how much compliance to be expected as her social situation is not conducive and so is her substance use- watch closely for Immune reconstitution inflammatory syndrome   Toxo IgG very high-  MRI brain no CNS lesions  . TOXO PCR negative On  PCP and MAI prophylaxis. 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

## 2023-12-24 NOTE — Consult Note (Signed)
 WOC Nurse Consult Note: Reason for Consult: sacral wound Incidentally noted two other wounds during my assessment  Wound type: Fissure; gluteal cleft; Irritant contact dermatitis ICD-10 CM Codes for Irritant Dermatitis L24A2 - Due to fecal, urinary or dual incontinence L30.4  - Erythema intertrigo. Also used for abrasion of the hand, chafing of the skin, dermatitis due to sweating and friction, friction dermatitis, friction eczema, and genital/thigh intertrigo.  2.  Abrasion; right lateral knee; partial thickness skin loss 3. Excoriation; linear area right buttock  Pressure Injury POA: NA  Measurement: Gluteal cleft; 0.2cm x 1.0cm x 0.1cm Right lateral knee; 0.3cm x 0.4cm x 0.1cm Right buttock; 0.1cm x 0.3cm x 0.1cm Wound bed: All areas are clean and pink, moist, non necrotic  Drainage (amount, consistency, odor) none Periwound: intact, noted rash on the bilateral LEs,  to bedside nursing and MD; unclear if this is new Dressing procedure/placement/frequency: Sacral foam in place, updated orders to make sure foam is place deep into cleft instead of over cleft.  Silicone foam to the right buttock and right lateral knee in place, continue.   Discussed POC with bedside nurse and hospitalist  Re consult if needed, will not follow at this time. Thanks  Glynda Soliday [employer], RN,CWOCN, CNS, CWON-AP [phone])

## 2023-12-24 NOTE — Consult Note (Signed)
 [facility] Psychiatric Consult Initial  Patient Name: .Meghan Jarvis  MRN: [id number]  DOB: 03/01/1976  Consult Order details:  Orders (From admission, onward)     Start     Ordered   12/24/23 0739  IP CONSULT TO PSYCHIATRY       Ordering Provider: Dezii, Alexandra, DO  Provider:  (Not yet assigned)  Question Answer Comment  Location [facility]   Reason for Consult? capacity assessment      12/24/23 0738             Mode of Visit: In person, I spent 45 min on this consult.     Psychiatry Consult Evaluation  Service Date: December 24, 2023 LOS:  LOS: 16 days  Chief Complaint I don't know what I am going to do after I leave the hospital.   Primary Psychiatric Diagnoses  Encounter psychiatric assessment  Assessment  Meghan Jarvis is a 48 y.o. female admitted: Medicallyfor 12/08/2023  9:57 PM for abdominal pain. She carries the psychiatric diagnoses of substance use and has a past medical history of HIV, acute metabolic encephalopathy, pyelonephritis, hypertension, migraine, atrial fibrillation.   Her current presentation of encounter for psychiatric assessment is requested by the attending to assess capacity. Please see plan below for detailed recommendations.   Diagnoses:  Active Hospital problems: Principal Problem:   Abdominal pain Active Problems:   Alcohol abuse   Acute hypoxic respiratory failure (HCC)   Pneumonia of right lower lobe due to infectious organism   Protein-calorie malnutrition, severe   Acute metabolic encephalopathy   Uncontrolled type 2 diabetes mellitus with hypoglycemia, without long-term current use of insulin  (HCC)   Cocaine abuse (HCC)   AIDS (HCC)   Severe sepsis (HCC)   Chest pain   Pancytopenia (HCC)   Generalized weakness   Diarrhea   Right upper quadrant abdominal pain   Headache   Underweight (BMI < 18.5)   Hypotension    Plan   ## Psychiatric Medication Recommendations:  -No new  medications  ## Medical Decision Making Capacity:  Patient has no communication barriers with language or understanding.  Patient understands treatment and care options that are available to her, and no status to provider if she has any questions, able to logically explain the reason why she is in the hospital today, she reflects appreciation in her care due to being able to understand the reasoning of her being in the hospital, and able to state her personal opinion on the care.  Patient is able to express her own values and fax regarding the care she is receiving.  She is able to communicate and express a choice clearly.  At this time this is reflected due to the patient being sober from substance use and that she has protective factors in place at this time being in the hospital.  Capacity can change once she is released from the hospital in which she will not have resources or protective factors.  Is a my opinion that as of December 24, 2023 patient has capacity to make healthcare decisions. ## Further Work-up:  -- No further workup recommended   ## Disposition:-- There are no psychiatric contraindications to discharge at this time.   ## Behavioral / Environmental: - No specific recommendations at this time.    Thank you for this consult request. Recommendations have been communicated to the primary team.  We will clear the patient from psych, with Capacity screening completed at this time.   Dorn Ruth  Lorenia Hoston, NP       History of Present Illness  Relevant Aspects of [facility] Course:  Admitted on 12/08/2023 for abdominal pain. They are currently in bed sitting up awake and alert actively participating in interview.   Patient Report:  48 year old female requested for capacity screening by the attending, patient is currently in her room sitting up appearing frustrated.  She was able to identify herself as well as her date of birth while being oriented to self, location, time, and  situation.  She is able to recall why she was brought to the hospital, and stating the reasoning of the treatments that she is receiving at this time.  During the capacity assessment, patient was overly worried about her discharge repeatedly asking if she can have resources to be able to get by on her own.  She was explained that she has transition of care ordered for her and someone will follow-up with her regarding resources.  There was no identified communication barrier while talking with the patient, and she was able to participate in the interview without any difficulties.  She was able to identify the reasoning of her admission and able to state why she is receiving treatment at the hospital.  She was able to state logical understanding of her care.  She is also able to state realistic expectations for the care she is receiving now and what happens after in hospital and her plans.  This patient states that she is homeless and has no resources and often results to substance use to be able to cope.  Due to being in the hospital she has multiple protective factors that allow her to be able logical decisions.  At this time as of December 24, 2023 patient does have capacity and is able to make healthcare decisions due to protective factors but this can change after discharge due to her history of substance use and also her lack of resources.  Psych ROS:  Depression: Positive Anxiety:  negatives Mania (lifetime and current): denies Psychosis: (lifetime and current): denies   Review of Systems  Constitutional:  Positive for malaise/fatigue.  HENT: Negative.    Eyes: Negative.   Respiratory: Negative.    Cardiovascular: Negative.   Gastrointestinal:  Positive for abdominal pain.  Genitourinary: Negative.   Musculoskeletal:  Positive for myalgias.  Skin:  Positive for itching.  Neurological: Negative.   Endo/Heme/Allergies: Negative.   Psychiatric/Behavioral:  Positive for depression.       Psychiatric and Social History  Psychiatric History:  Information collected from Pt  Prev Dx/Sx: Substance use Current Psych Provider: Denies Home Meds (current): See Attending notes Family Psych History: Denies Family Hx suicide: Denies  Social History:   Living Situation: Homeless Spiritual Hx: Denies Access to weapons/lethal means: Denies   Substance History Alcohol: Endorses, 3 or more drinks a day  Tobacco: Denies Illicit drugs: Cocaine   Exam Findings   Vital Signs:  Temp:  [98.1 F (36.7 C)-98.5 F (36.9 C)] 98.2 F (36.8 C) (01/13 1046) Pulse Rate:  [96-108] 96 (01/13 1046) Resp:  [17-18] 18 (01/13 1046) BP: (89-99)/(65-73) 89/66 (01/13 1046) SpO2:  [98 %-100 %] 98 % (01/13 1046) Weight:  [51.1 kg] 51.1 kg (01/13 0245) Blood pressure (!) 89/66, pulse 96, temperature 98.2 F (36.8 C), temperature source Oral, resp. rate 18, height 5' 7.99 (1.727 m), weight 51.1 kg, SpO2 98%. Body mass index is 17.13 kg/m.  Physical Exam HENT:     Head: Normocephalic.     Mouth/Throat:  Mouth: Mucous membranes are moist.  Cardiovascular:     Rate and Rhythm: Normal rate.  Pulmonary:     Effort: Pulmonary effort is normal.  Musculoskeletal:     Cervical back: Normal range of motion.  Skin:    General: Skin is warm and dry.  Neurological:     Mental Status: She is alert.     Mental Status Exam: General Appearance: Casual and Disheveled  Orientation:  Full (Time, Place, and Person)  Memory:  Immediate;   Good Recent;   Good Remote;   Good  Concentration:  Concentration: Good and Attention Span: Good  Recall:  Good  Attention  Fair  Eye Contact:  Good  Speech:  Clear and Coherent  Language:  Good  Volume:  Normal  Mood: Depressed  Affect:  Depressed  Thought Process:  Coherent  Thought Content:  Logical  Suicidal Thoughts:  No  Homicidal Thoughts:  No  Judgement:  Good  Insight:  Good  Psychomotor Activity:  Normal  Akathisia:  No  Fund of  Knowledge:  Good      Assets:  Others:  None identifies.   Cognition:  WNL  ADL's:  Intact  AIMS (if indicated):        Other History   These have been pulled in through the EMR, reviewed, and updated if appropriate.  Family History:  The patient's Family history is unknown by patient.  Medical History: Past Medical History:  Diagnosis Date  . Asthma   . Depression   . HIV (human immunodeficiency virus infection) [facility])     Surgical History: Past Surgical History:  Procedure Laterality Date  . ABDOMINAL HYSTERECTOMY    . APPENDECTOMY       Medications:   Current Facility-Administered Medications:  .  acetaminophen  (TYLENOL ) tablet 650 mg, 650 mg, Oral, Q6H PRN, Josette, Richard, MD, 650 mg at 12/23/23 1157 .  azithromycin  (ZITHROMAX ) tablet 1,200 mg, 1,200 mg, Oral, Weekly, Zeigler, Dustin G, RPH, 1,200 mg at 12/19/23 1505 .  bictegravir-emtricitabine -tenofovir  AF (BIKTARVY ) 50-200-25 MG per tablet 1 tablet, 1 tablet, Oral, Daily, Ravishankar, Jayashree, MD, 1 tablet at 12/24/23 0944 .  DULoxetine  (CYMBALTA ) DR capsule 30 mg, 30 mg, Oral, QHS, Dezii, Alexandra, DO, 30 mg at 12/23/23 2114 .  enoxaparin  (LOVENOX ) injection 40 mg, 40 mg, Subcutaneous, QHS, Aleskerov, Fuad, MD, 40 mg at 12/23/23 2115 .  feeding supplement (ENSURE ENLIVE / ENSURE PLUS) liquid 237 mL, 237 mL, Oral, TID BM, Aleskerov, Fuad, MD, 237 mL at 12/24/23 0946 .  folic acid  (FOLVITE ) tablet 1 mg, 1 mg, Oral, Daily, Aleskerov, Fuad, MD, 1 mg at 12/24/23 0944 .  lactulose  (CHRONULAC ) 10 GM/15ML solution 10 g, 10 g, Oral, TID, Dezii, Alexandra, DO, 10 g at 12/24/23 0944 .  midodrine  (PROAMATINE ) tablet 10 mg, 10 mg, Oral, TID WC, Wieting, Richard, MD, 10 mg at 12/24/23 0944 .  multivitamin with minerals tablet 1 tablet, 1 tablet, Oral, QHS, Zeigler, Dustin G, RPH, 1 tablet at 12/23/23 2114 .  nicotine  polacrilex (NICORETTE ) gum 2 mg, 2 mg, Oral, PRN, Jesus America, NP .  oxyCODONE  (Oxy IR/ROXICODONE )  immediate release tablet 5 mg, 5 mg, Oral, Q4H PRN, [name], Elizabeth Achieng, NP, 5 mg at 12/23/23 2114 .  pantoprazole  (PROTONIX ) EC tablet 40 mg, 40 mg, Oral, BID, Josette Ade, MD, 40 mg at 12/24/23 0944 .  polyethylene glycol (MIRALAX  / GLYCOLAX ) packet 17 g, 17 g, Oral, Daily, Josette, Richard, MD, 17 g at 12/24/23 0944 .  sulfamethoxazole -trimethoprim  (BACTRIM ) 400-80 MG  per tablet 1 tablet, 1 tablet, Oral, Daily, Ravishankar, Jayashree, MD, 1 tablet at 12/24/23 0944 .  thiamine  (VITAMIN B1) tablet 100 mg, 100 mg, Oral, Daily, Aleskerov, Fuad, MD, 100 mg at 12/24/23 0944  Allergies: Allergies  Allergen Reactions  . Fish Allergy Other (See Comments)    Crab legs result in itching  Crab legs result in itching  Crab legs result in itching  . Shellfish Allergy Other (See Comments)    Crab legs result in itching  . Buprenorphine Hcl     Pt states she not allergic  . Morphine  And Codeine Itching    hives    Dorn Jama Der, NP

## 2023-12-24 NOTE — Evaluation (Signed)
 Occupational Therapy Re-Evaluation Patient Details Name: Meghan Jarvis MRN: [id number] DOB: Jan 22, 1976 Today's Date: 12/24/2023   History of Present Illness Pt is a 48 year old female admitted with [facility] with acute hypoxic respiratory failure, severe sepsis, community acquired PNA acute metabolic encephalopathy; She was intubated, extubated 12/30    PMH significant for alcohol abuse, cocaine abuse, homelessness, AIDS, atrial fibrillation, hypothyroidism, type 2 diabetes mellitus   Clinical Impression   Pt seen for OT re-evaluation this date. Pt received in bed, endorsing pain but initially reluctant to share with OT where she was hurting stating, I've told everyone where I hurt.  Later in session, pt endorses her stomach ached while having a BM and her low back hurt. RN notified. Pt shares that she's waiting for her husband to come pick her up from the hospital and looks out the window and points to a red and white pole across the street and states that is her husband, wearing red. Care team notified. Pt continues to demonstrate very poor overall awareness of situation and deficits. She required MOD A for transfers and MIN A for step pivot with RW to [facility]. Poor hygiene requiring cues for thoroughness. MOD A for clothing mgt and CGA to MIN A for static standing balance to complete donning of brief over hips. Pt continues to benefit from skilled OT services to maximize safety and independence.      If plan is discharge home, recommend the following: A lot of help with walking and/or transfers;A lot of help with bathing/dressing/bathroom;Assistance with cooking/housework;Assistance with feeding;Direct supervision/assist for medications management;Direct supervision/assist for financial management;Assist for transportation;Help with stairs or ramp for entrance;Supervision due to cognitive status    Functional Status Assessment  Patient has had a recent decline in their functional status and  demonstrates the ability to make significant improvements in function in a reasonable and predictable amount of time.  Equipment Recommendations  Other (comment) (defer)    Recommendations for Other Services       Precautions / Restrictions Precautions Precautions: Fall Restrictions Weight Bearing Restrictions Per Provider Order: No      Mobility Bed Mobility Overal bed mobility: Needs Assistance Bed Mobility: Supine to Sit, Sit to Supine     Supine to sit: Min assist, HOB elevated, Used rails Sit to supine: HOB elevated, Used rails, Contact guard assist   General bed mobility comments: assist for LLE    Transfers Overall transfer level: Needs assistance Equipment used: Rolling walker (2 wheels) Transfers: Sit to/from Stand, Bed to chair/wheelchair/BSC Sit to Stand: Min assist, Mod assist     Step pivot transfers: Min assist     General transfer comment: VC for sequencing and safety, physical assist to adjust RW positioning throughout transitional movements      Balance Overall balance assessment: Needs assistance Sitting-balance support: Feet supported, Single extremity supported, No upper extremity supported Sitting balance-Leahy Scale: Fair     Standing balance support: During functional activity, No upper extremity supported, Single extremity supported Standing balance-Leahy Scale: Poor Standing balance comment: able to briefly let go of RW to complete donning of brief over hips in standing but required CGA-MIN A To maintain static standing balance                           ADL either performed or assessed with clinical judgement   ADL Overall ADL's : Needs assistance/impaired     Grooming: Sitting;Set up;Wash/dry face;Wash/dry hands  Lower Body Dressing: Moderate assistance;Sit to/from stand Lower Body Dressing Details (indicate cue type and reason): Pt required assist for all aspects of doffing brief, donning brief and  applying pad Toilet Transfer: BSC/3in1;Rolling walker (2 wheels);Cueing for safety;Cueing for sequencing;Contact guard assist Toilet Transfer Details (indicate cue type and reason): step pivot to [facility] wiht VC for sequencing, cues for hand placement and hand over hand assist to reach back and locate BSC arm rests Toileting- Clothing Manipulation and Hygiene: Sit to/from stand;Contact guard assist;Moderate assistance Toileting - Clothing Manipulation Details (indicate cue type and reason): CGA in standing, cues for thoroughness during pericare; MOD A for clothing mgt     Functional mobility during ADLs: Minimal assistance;Rolling walker (2 wheels);Cueing for safety;Cueing for sequencing       Vision         Perception         Praxis         Pertinent Vitals/Pain Pain Assessment Pain Assessment: 0-10 Pain Score: 9  Pain Location: low back (after initially declining to tell OT where it hurt) and stomach Pain Descriptors / Indicators: Grimacing, Guarding, Discomfort Pain Intervention(s): Limited activity within patient's tolerance, Monitored during session, Repositioned, Patient requesting pain meds-RN notified     Extremity/Trunk Assessment Upper Extremity Assessment Upper Extremity Assessment: Generalized weakness   Lower Extremity Assessment Lower Extremity Assessment: Generalized weakness   Cervical / Trunk Assessment Cervical / Trunk Assessment: Normal   Communication     Cognition Arousal: Alert Behavior During Therapy: Flat affect Overall Cognitive Status: No family/caregiver present to determine baseline cognitive functioning Area of Impairment: Orientation, Attention, Memory, Following commands, Safety/judgement, Awareness, Problem solving                 Orientation Level: Disoriented to, Situation Current Attention Level: Focused Memory: Decreased short-term memory, Decreased recall of precautions Following Commands: Follows one step commands  inconsistently, Follows one step commands with increased time Safety/Judgement: Decreased awareness of safety, Decreased awareness of deficits Awareness: Intellectual Problem Solving: Slow processing, Decreased initiation, Difficulty sequencing, Requires verbal cues, Requires tactile cues General Comments: significant cuing during session for safety, sequencing, initiation of tasks, etc.     General Comments       Exercises     Shoulder Instructions      Home Living                                   Additional Comments: per chart pt is homeless, pt did not provide PLOF      Prior Functioning/Environment Prior Level of Function : Patient poor historian/Family not available                        OT Problem List: Decreased strength;Pain;Decreased cognition;Decreased safety awareness;Decreased activity tolerance;Impaired balance (sitting and/or standing);Decreased knowledge of use of DME or AE      OT Treatment/Interventions: Self-care/ADL training;Therapeutic exercise;Therapeutic activities;DME and/or AE instruction;Patient/family education;Balance training;Cognitive remediation/compensation;Energy conservation    OT Goals(Current goals can be found in the care plan section) Acute Rehab OT Goals Patient Stated Goal: get better OT Goal Formulation: With patient Time For Goal Achievement: 01/07/24 Potential to Achieve Goals: Fair  OT Frequency: Min 1X/week    Co-evaluation              AM-PAC OT 6 Clicks Daily Activity     Outcome Measure Help from another person eating meals?: None Help from  another person taking care of personal grooming?: A Little Help from another person toileting, which includes using toliet, bedpan, or urinal?: A Lot Help from another person bathing (including washing, rinsing, drying)?: A Little Help from another person to put on and taking off regular upper body clothing?: A Little Help from another person to put on and  taking off regular lower body clothing?: A Lot 6 Click Score: 17   End of Session Equipment Utilized During Treatment: Rolling walker (2 wheels) Nurse Communication: Mobility status;Other (comment) (MD, RN, MS - cognition)  Activity Tolerance: Patient tolerated treatment well Patient left: in bed;with call bell/phone within reach;with bed alarm set  OT Visit Diagnosis: Other abnormalities of gait and mobility (R26.89);Muscle weakness (generalized) (M62.81)                Time: 8577-8555 OT Time Calculation (min): 22 min Charges:  OT General Charges $OT Visit: 1 Visit OT Evaluation $OT Re-eval: 1 Re-eval OT Treatments $Self Care/Home Management : 8-22 mins  Warren SAUNDERS., MPH, MS, OTR/L ascom [phone] 12/24/23, 3:57 PM

## 2023-12-24 NOTE — Progress Notes (Signed)
 Physical Therapy Treatment Patient Details Name: Meghan Jarvis MRN: [id number] DOB: 1976-10-05 Today's Date: 12/24/2023   History of Present Illness Pt is a 48 year old female admitted with [facility] with acute hypoxic respiratory failure, severe sepsis, community acquired PNA acute metabolic encephalopathy; She was intubated, extubated 12/30    PMH significant for alcohol abuse, cocaine abuse, homelessness, AIDS, atrial fibrillation, hypothyroidism, type 2 diabetes mellitus    PT Comments  Pt seen for PT tx with pt received in bed. Pt with decreased ability to follow simple commands even with extra time, multimodal cuing. PT encourages pt to sit EOB, with pt requiring extra time to upright trunk & move BLE to EOB, not fully sitting upright. Pt reports dizziness with movement, limited engagement with session. Pt assisted back to bed & left with all needs in reach.    If plan is discharge home, recommend the following: Assistance with cooking/housework;Direct supervision/assist for financial management;Direct supervision/assist for medications management;Assist for transportation;Help with stairs or ramp for entrance;Supervision due to cognitive status;A little help with walking and/or transfers;A little help with bathing/dressing/bathroom   Can travel by private vehicle     Yes  Equipment Recommendations  Rolling walker (2 wheels)    Recommendations for Other Services       Precautions / Restrictions Precautions Precautions: Fall Restrictions Weight Bearing Restrictions Per Provider Order: No     Mobility  Bed Mobility   Bed Mobility: Supine to Sit     Supine to sit: Supervision, HOB elevated, Used rails Sit to supine: Supervision, HOB elevated, Used rails        Transfers                        Ambulation/Gait                   Stairs             Wheelchair Mobility     Tilt Bed    Modified Rankin (Stroke Patients Only)       Balance  Overall balance assessment: Needs assistance   Sitting balance-Leahy Scale: Fair                                      Cognition Arousal: Alert Behavior During Therapy: Flat affect Overall Cognitive Status: No family/caregiver present to determine baseline cognitive functioning                       Memory: Decreased short-term memory, Decreased recall of precautions Following Commands: Follows one step commands inconsistently, Follows one step commands with increased time       General Comments: pt with very poor overall awareness, cued pt to sit on EOB with feet on floor & pt only moves feet off EOB, requires ongoing cuing to initiate & continue movment with pt requiring significantly extra time to do so.        Exercises      General Comments        Pertinent Vitals/Pain Pain Assessment Pain Assessment: Faces Faces Pain Scale: Hurts even more Pain Location: I've told yall where I hurt! -- pt does not elaborate Pain Descriptors / Indicators: Grimacing, Guarding, Discomfort Pain Intervention(s): Monitored during session, Limited activity within patient's tolerance    Home Living  Prior Function            PT Goals (current goals can now be found in the care plan section) Acute Rehab PT Goals Patient Stated Goal: to feel better PT Goal Formulation: With patient Time For Goal Achievement: 12/26/23 Potential to Achieve Goals: Fair Progress towards PT goals: PT to reassess next treatment    Frequency    Min 1X/week      PT Plan      Co-evaluation              AM-PAC PT 6 Clicks Mobility   Outcome Measure  Help needed turning from your back to your side while in a flat bed without using bedrails?: None Help needed moving from lying on your back to sitting on the side of a flat bed without using bedrails?: A Little Help needed moving to and from a bed to a chair (including a wheelchair)?:  A Little Help needed standing up from a chair using your arms (e.g., wheelchair or bedside chair)?: A Little Help needed to walk in hospital room?: A Little Help needed climbing 3-5 steps with a railing? : A Lot 6 Click Score: 18    End of Session   Activity Tolerance: Other (comment) (self limiting, reports dizziness) Patient left: in bed;with call bell/phone within reach;with bed alarm set Nurse Communication: Mobility status PT Visit Diagnosis: Other abnormalities of gait and mobility (R26.89);Difficulty in walking, not elsewhere classified (R26.2);Muscle weakness (generalized) (M62.81)     Time: 8865-8854 PT Time Calculation (min) (ACUTE ONLY): 11 min  Charges:    $Therapeutic Activity: 8-22 mins PT General Charges $$ ACUTE PT VISIT: 1 Visit                     Richerd Pinal, PT, DPT 12/24/23, 11:51 AM   Richerd CHRISTELLA Pinal 12/24/2023, 11:50 AM

## 2023-12-24 NOTE — Progress Notes (Signed)
 PROGRESS NOTE    Meghan Jarvis  FMW:[id number] DOB: Mar 20, 1976 DOA: 12/08/2023 [name]  Chief Complaint  Patient presents with   Altered Mental Status    Hospital Course:  Meghan Jarvis is 48 y.o. female with history of alcohol abuse, cocaine abuse, homelessness, HIV/AIDS, atrial fibrillation, hypothyroidism, type 2 diabetes, who presented to [facility] with acute respiratory failure.  Reportedly she was found down by a friend who would not wake up.  She was hypoxic in the 70s on arrival. She was intubated and admitted by the critical care service.  She was started on antibiotics for presumed aspiration pneumonia.  Urine tox positive for cocaine.  Patient was successfully extubated on 12/30 and transferred to [facility] on 1/1.  Patient's stay has then been prolonged pending rehab placement and complaints of various pain.  Did complain of chest pain but cardiac enzymes were negative x 2 and EKG did not reveal any ST elevations.  She also complained of abdominal pain, right upper quadrant ultrasound negative.  She complained of a headache, MRI brain negative for any toxoplasmosis lesions but did show advanced cerebral atrophy.  L1/5 patient had some hypotension which was responsive to 2 fluid boluses and midodrine .  Patient continued to complain of abdominal pain on 1/6 that she underwent CT scan which was negative for acute process and in fact revealed better aeration of right lower lung.  It did reveal some constipation so she was started on bowel regimen.  Subjective: No acute events overnight.  Patient has small lesion noted by RN.  Appears to be small fissure around gluteal cleft.  Patient is not complaining of pain in this area.    Objective: Vitals:   12/23/23 2247 12/23/23 2303 12/24/23 0245 12/24/23 0621  BP: 99/73   90/65  Pulse: (!) 108 99  97  Resp: 17   17  Temp: 98.5 F (36.9 C)   98.1 F (36.7 C)  TempSrc:      SpO2: 100%   99%  Weight:   51.1 kg    Height:        Intake/Output Summary (Last 24 hours) at 12/24/2023 0739 Last data filed at 12/23/2023 1548 Gross per 24 hour  Intake 0 ml  Output --  Net 0 ml    Filed Weights   12/22/23 0500 12/23/23 0907 12/24/23 0245  Weight: 50.4 kg 49.5 kg 51.1 kg    Examination: General exam: laying in bed. NAD.   Respiratory system: No work of breathing, symmetric chest wall expansion Cardiovascular system: S1 & S2 heard, RRR.  Gastrointestinal system: right-sided flank tenderness to palpation. Neuro: Alert, disoriented, requires significant prompting to follow commands.  Is unable to consistently report back what is said to her. Extremities: Symmetric Skin: rashes and lesions in various stages of healing, some surrounding excoriations. Psychiatry: calm, no agitation.   New sacral sore:   Assessment & Plan:  Principal Problem:   Abdominal pain Active Problems:   Hypotension   Acute hypoxic respiratory failure (HCC)   Severe sepsis (HCC)   Pneumonia of right lower lobe due to infectious organism   Acute metabolic encephalopathy   AIDS (HCC)   Protein-calorie malnutrition, severe   Pancytopenia (HCC)   Headache   Cocaine abuse (HCC)   Right upper quadrant abdominal pain   Alcohol abuse   Uncontrolled type 2 diabetes mellitus with hypoglycemia, without long-term current use of insulin  (HCC)   Chest pain   Generalized weakness   Diarrhea   Underweight (  BMI < 18.5)  New Fever - x1 on 1/11 -- UA ordered, pending still. Have request RN to follow up - CTAP without acute abnormality.  Reveals some constipation, have added lactulose  - If fever recurs will panculture  Acute hypoxic respiratory failure, resolved - Secondary to drug overdose and aspiration pneumonia - Initially managed in ICU on ventilator - Successfully extubated 12/30 - Now on room air - Has completed antibiotic course - Monitor respiratory status closely  Fall -Did not hit her head - Still with profound  weakness and difficulty with ambulation.  - thoracic and lumbar MRI without acute pathology -- Weakness likely 2/2 to HAND  Hypotension - Continue midodrine , MAP goal above 65  Severe sepsis - On admission: Tachycardia, tachypnea, fever, pneumonia. - Secondary to aspiration pneumonia as above - Status post antibiotics  Acute metabolic encephalopathy - Has improved some, at this point I suspect she is at her baseline.  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 - Initial presentation likely secondary to severe sepsis as above - MRI negative for acute pathology, does show severe cerebral atrophy - Continue frequent reorientation - TOC has requested official capacity assessment. BH consulted.  Aspiration pneumonia - Status post 5 days antibiotics as above  AIDS HIV - Viral load 2 million, CD4: 14 - ID consulted - Currently on PCP and MAI prophylaxis with Bactrim  and Zithromax  - Toxoplasma IgG came back elevated, MRI of the brain does not show any enhancing lesions -- Fungitell +, culture neg -- AFB still pending - Patient has been initiated on Biktarvy  - Will need close outpatient follow-up -- Keep universal precautions  Headache - Resolved - Required IV magnesium  earlier in hospital course - Cryptococcus antigen negative  Pancytopenia - Secondary to HIV as above - Platelet count now within normal range - Continue to trend CBC  Protein calorie malnutrition, severe BMI under 18 - Continue supplements - Nutrition consult  Cocaine abuse - Has been counseled on cessation  Alcohol abuse - Now out of withdrawal window - Continue with thiamine , multivitamin, folic acid  supplementation  Controlled type 2 diabetes with hyperglycemia, without long-term use of insulin  - Hemoglobin A1c 6.0%  AKI - acute worsening today may be 2/2 to fever and brief hypotension overnight. - CMP in AM  Abdominal pain - Persistent Right flank pain very tender to palpation,  all scans negative.  - Continue bowel regimen  Constipation - Continue bowel regimen as above  Generalized weakness Frequent falls - Secondary to all above conditions - PT/OT - PT recommending rehab, TOC consulted for placement  Chest pain - Atypical - Troponin x 2 negative, EKG without ST elevations  Chronic pain - Patient complains of pain all over. Work up for acute pathology as above - 1/8 Initiated Cymbalta  for chronic pain management.  Start at 30 mg daily and titrate up as patient tolerates  Anxiety - Pt was Evaluated overnight on 1/9 and was given Ativan  due to concerns of withdrawal.  Patient has been admitted for  2 weeks.  EtOH withdrawal unlikely at this time.  UDS on arrival positive only for cocaine.  Suspect anxiety or panic attack more likely. -- PRN ativan   Sacral wound - First noted 1/12 - local wound care   Wound / Incision (Open or Dehisced) 12/23/23 Other (Comment) Sacrum Lower open skin area with redness, bleeding on sacrum (Active)  Date First Assessed/Time First Assessed: 12/23/23 1800   Wound Type: (c) Other (Comment)  Location: Sacrum  Location Orientation: Lower  Wound Description (Comments):  open skin area with redness, bleeding on sacrum  Present on Admission: No    Assessments 12/23/2023  5:38 PM 12/23/2023  9:14 PM  Dressing Type Foam - Lift dressing to assess site every shift Foam - Lift dressing to assess site every shift  Dressing Changed New --  Dressing Status Clean, Dry, Intact --  Dressing Change Frequency PRN PRN  Site / Wound Assessment Bleeding;Red;Painful Dressing in place / Unable to assess  Drainage Amount Moderate --  Drainage Description No odor;Other (Comment) --     No associated orders.        DVT prophylaxis: Lovenox    Code Status: Full Code Family Communication: none at bedside Disposition:  Status is: Inpatient, medically cleared. pending rehab placement    Consultants:  Infectious disease Procedures:   N/a  Antimicrobials:  Anti-infectives (From admission, onward)    Start     Dose/Rate Route Frequency Ordered Stop   12/19/23 1400  azithromycin  (ZITHROMAX ) tablet 1,200 mg        1,200 mg Oral Weekly 12/18/23 1346     12/18/23 1000  sulfamethoxazole -trimethoprim  (BACTRIM  DS) 800-160 MG per tablet 1 tablet        1 tablet Oral Daily 12/17/23 1621     12/14/23 1530  bictegravir-emtricitabine -tenofovir  AF (BIKTARVY ) 50-200-25 MG per tablet 1 tablet        1 tablet Oral Daily 12/14/23 1442     12/12/23 1000  amoxicillin -clavulanate (AUGMENTIN ) 875-125 MG per tablet 1 tablet        1 tablet Oral Every 12 hours 12/11/23 1345 12/12/23 2131   12/12/23 1000  azithromycin  (ZITHROMAX ) tablet 1,250 mg  Status:  Discontinued        1,200 mg Oral Weekly 12/11/23 1618 12/18/23 1346   12/11/23 2000  sulfamethoxazole -trimethoprim  (BACTRIM ) 400-80 MG per tablet 1 tablet  Status:  Discontinued        1 tablet Oral Daily 12/11/23 1618 12/17/23 1621   12/09/23 2200  azithromycin  (ZITHROMAX ) 500 mg in sodium chloride  0.9 % 250 mL IVPB  Status:  Discontinued        500 mg 250 mL/hr over 60 Minutes Intravenous Every 24 hours 12/09/23 0734 12/11/23 1618   12/09/23 2000  cefTRIAXone  (ROCEPHIN ) 2 g in sodium chloride  0.9 % 100 mL IVPB  Status:  Discontinued        2 g 200 mL/hr over 30 Minutes Intravenous Every 24 hours 12/09/23 0953 12/09/23 1840   12/09/23 2000  piperacillin -tazobactam (ZOSYN ) IVPB 3.375 g        3.375 g 12.5 mL/hr over 240 Minutes Intravenous Every 8 hours 12/09/23 1850 12/12/23 0037   12/09/23 0600  Ampicillin -Sulbactam (UNASYN ) 3 g in sodium chloride  0.9 % 100 mL IVPB  Status:  Discontinued        3 g 200 mL/hr over 30 Minutes Intravenous Every 6 hours 12/09/23 0442 12/09/23 0953   12/08/23 2215  cefTRIAXone  (ROCEPHIN ) 2 g in sodium chloride  0.9 % 100 mL IVPB        2 g 200 mL/hr over 30 Minutes Intravenous Once 12/08/23 2214 12/08/23 2308   12/08/23 2215  azithromycin  (ZITHROMAX ) 500  mg in sodium chloride  0.9 % 250 mL IVPB        500 mg 250 mL/hr over 60 Minutes Intravenous  Once 12/08/23 2214 12/09/23 0000       Data Reviewed: I have personally reviewed following labs and imaging studies CBC: Recent Labs  Lab 12/20/23 0401 12/21/23 0617 12/22/23 0559 12/23/23 0538 12/24/23  0619  WBC 5.5 3.9* 4.8 5.8 5.5  NEUTROABS 3.9 2.4 3.1 3.8 PENDING  HGB 8.4* 9.1* 9.2* 9.8* 9.6*  HCT 27.3* 29.3* 28.9* 30.7* 29.9*  MCV 86.9 86.4 83.5 84.1 83.1  PLT 315 346 382 393 383   Basic Metabolic Panel: Recent Labs  Lab 12/20/23 0401 12/21/23 0617 12/22/23 0559 12/23/23 0538 12/24/23 0619  NA 143 140 137 135 136  K 4.6 4.6 4.6 4.7 4.8  CL 109 106 103 99 101  CO2 25 25 24 23 23   GLUCOSE 91 77 78 97 93  BUN 22* 19 20 25* 38*  CREATININE 0.98 0.96 0.93 1.13* 1.38*  CALCIUM  9.0 9.5 9.6 9.9 9.4  MG 2.3 2.2 2.2 2.3 2.4  PHOS 5.0* 5.1* 5.2* 5.3* 5.3*   GFR: Estimated Creatinine Clearance: 39.8 mL/min (A) (by C-G formula based on SCr of 1.38 mg/dL (H)). Liver Function Tests: Recent Labs  Lab 12/20/23 0401 12/21/23 0617 12/22/23 0559 12/23/23 0538 12/24/23 0619  AST 33 26 22 32 33  ALT 35 31 27 31 27   ALKPHOS 53 52 59 71 61  BILITOT 0.4 0.5 0.4 0.6 0.5  PROT 7.6 7.9 8.2* 9.1* 8.4*  ALBUMIN 2.9* 3.0* 3.0* 3.5 3.2*   CBG: No results for input(s): GLUCAP in the last 168 hours.   No results found for this or any previous visit (from the past 240 hours).     Radiology Studies: CT ABDOMEN PELVIS W CONTRAST Result Date: 12/23/2023 CLINICAL DATA:  Provided history: Flank pain, US  neg or equivocal (Ped 0-17y) persistent right flank pain, has AIDS and fever. suspect infection, pyelo vs abdominal abcess?SABRA aware Prior scans negative. EXAM: CT ABDOMEN AND PELVIS WITH CONTRAST TECHNIQUE: Multidetector CT imaging of the abdomen and pelvis was performed using the standard protocol following bolus administration of intravenous contrast. RADIATION DOSE REDUCTION: This exam  was performed according to the departmental dose-optimization program which includes automated exposure control, adjustment of the mA and/or kV according to patient size and/or use of iterative reconstruction technique. CONTRAST:  75mL OMNIPAQUE  IOHEXOL  300 MG/ML  SOLN COMPARISON:  CT 12/17/2023 FINDINGS: Lower chest: Breathing motion artifact. Resolution of the previous right lung base opacity. New patchy and ground-glass consolidation in the medial left lower lobe series 4, image 48. Hepatobiliary: No focal liver abnormality is seen. No gallstones, gallbladder wall thickening, or biliary dilatation. The gallbladder is physiologically distended. Pancreas: No ductal dilatation or inflammation. Spleen: Normal in size without focal abnormality. Adrenals/Urinary Tract: No adrenal nodule. No hydronephrosis. No renal calculi. Allowing for motion, there is homogeneous renal enhancement. No focal renal abnormality. Unremarkable urinary bladder, no bladder wall thickening. Stomach/Bowel: Bowel assessment is limited in the absence of enteric contrast and paucity of intra-abdominal fat. Motion further limits assessment. The stomach is nondistended. No small bowel distension or obstruction. There is no evidence of small bowel inflammation appendectomy. Moderate volume of stool throughout the colon. There is no colonic wall thickening or pericolonic edema. Question of rectal wall thickening series 2, image 80, but no perirectal fat stranding. Vascular/Lymphatic: Aortic atherosclerosis. No aneurysm. Circumaortic left renal vein. The portal vein is patent. Reproductive: The uterus is not seen.  No adnexal mass. Other: No free air or ascites. Bubble calcified mesh at the umbilicus, chronic and unchanged. There is mild body wall edema. Musculoskeletal: There are no acute or suspicious osseous abnormalities. Spine recently assessed with MRI. There is mild edema and hyperemia within the right gluteal musculature, series 2, image 67,  but no drainable collection. IMPRESSION:  1. New patchy airspace disease in the medial left lower lobe suspicious for pneumonia. 2. Intramuscular edema and hyperemia within the right gluteal musculature may represent myositis. No drainable collection. 3. Equivocal rectal wall thickening versus nondistention. Recommend correlation with physical exam. 4. Moderate colonic stool burden. Electronically Signed   By: Andrea Gasman M.D.   On: 12/23/2023 18:15     Scheduled Meds:  azithromycin   1,200 mg Oral Weekly   bictegravir-emtricitabine -tenofovir  AF  1 tablet Oral Daily   DULoxetine   30 mg Oral QHS   enoxaparin  (LOVENOX ) injection  40 mg Subcutaneous QHS   feeding supplement  237 mL Oral TID BM   folic acid   1 mg Oral Daily   midodrine   10 mg Oral TID WC   multivitamin with minerals  1 tablet Oral QHS   pantoprazole   40 mg Oral BID   polyethylene glycol  17 g Oral Daily   sulfamethoxazole -trimethoprim   1 tablet Oral Daily   thiamine   100 mg Oral Daily   Continuous Infusions:   LOS: 16 days    Time spent:  55min  Joshue Badal, DO [facility]  To contact the attending physician between 7A-7P please use Epic Chat. To contact the covering physician during after hours 7P-7A, please review Amion.   12/24/2023, 7:39 AM   *This document has been created with the assistance of dictation software. Please excuse typographical errors. *

## 2023-12-25 DIAGNOSIS — R636 Underweight: Secondary | ICD-10-CM | POA: Diagnosis not present

## 2023-12-25 DIAGNOSIS — J189 Pneumonia, unspecified organism: Secondary | ICD-10-CM | POA: Diagnosis not present

## 2023-12-25 DIAGNOSIS — F1410 Cocaine abuse, uncomplicated: Secondary | ICD-10-CM | POA: Diagnosis not present

## 2023-12-25 DIAGNOSIS — J9601 Acute respiratory failure with hypoxia: Secondary | ICD-10-CM | POA: Diagnosis not present

## 2023-12-25 DIAGNOSIS — G9341 Metabolic encephalopathy: Secondary | ICD-10-CM | POA: Diagnosis not present

## 2023-12-25 DIAGNOSIS — B20 Human immunodeficiency virus [HIV] disease: Secondary | ICD-10-CM | POA: Diagnosis not present

## 2023-12-25 LAB — CBC WITH DIFFERENTIAL/PLATELET
Abs Immature Granulocytes: 0.02 10*3/uL (ref 0.00–0.07)
Basophils Absolute: 0 10*3/uL (ref 0.0–0.1)
Basophils Relative: 1 %
Eosinophils Absolute: 0.5 10*3/uL (ref 0.0–0.5)
Eosinophils Relative: 10 %
HCT: 28.5 % — ABNORMAL LOW (ref 36.0–46.0)
Hemoglobin: 8.8 g/dL — ABNORMAL LOW (ref 12.0–15.0)
Immature Granulocytes: 0 %
Lymphocytes Relative: 15 %
Lymphs Abs: 0.8 10*3/uL (ref 0.7–4.0)
MCH: 26.9 pg (ref 26.0–34.0)
MCHC: 30.9 g/dL (ref 30.0–36.0)
MCV: 87.2 fL (ref 80.0–100.0)
Monocytes Absolute: 0.6 10*3/uL (ref 0.1–1.0)
Monocytes Relative: 10 %
Neutro Abs: 3.4 10*3/uL (ref 1.7–7.7)
Neutrophils Relative %: 64 %
Platelets: 308 10*3/uL (ref 150–400)
RBC: 3.27 MIL/uL — ABNORMAL LOW (ref 3.87–5.11)
RDW: 15.2 % (ref 11.5–15.5)
Smear Review: NORMAL
WBC: 5.3 10*3/uL (ref 4.0–10.5)
nRBC: 0 % (ref 0.0–0.2)

## 2023-12-25 LAB — COMPREHENSIVE METABOLIC PANEL
ALT: 28 U/L (ref 0–44)
AST: 32 U/L (ref 15–41)
Albumin: 3 g/dL — ABNORMAL LOW (ref 3.5–5.0)
Alkaline Phosphatase: 64 U/L (ref 38–126)
Anion gap: 9 (ref 5–15)
BUN: 34 mg/dL — ABNORMAL HIGH (ref 6–20)
CO2: 27 mmol/L (ref 22–32)
Calcium: 9.5 mg/dL (ref 8.9–10.3)
Chloride: 106 mmol/L (ref 98–111)
Creatinine, Ser: 1.04 mg/dL — ABNORMAL HIGH (ref 0.44–1.00)
GFR, Estimated: >60 mL/min (ref >60)
Glucose, Bld: 89 mg/dL (ref 70–99)
Potassium: 4.5 mmol/L (ref 3.5–5.1)
Sodium: 142 mmol/L (ref 135–145)
Total Bilirubin: 0.2 mg/dL (ref 0.0–1.2)
Total Protein: 8 g/dL (ref 6.5–8.1)

## 2023-12-25 LAB — PHOSPHORUS: Phosphorus: 4.9 mg/dL — ABNORMAL HIGH (ref 2.5–4.6)

## 2023-12-25 LAB — MAGNESIUM: Magnesium: 2.1 mg/dL (ref 1.7–2.4)

## 2023-12-25 LAB — MISC LABCORP TEST (SEND OUT): Labcorp test code: 164284

## 2023-12-25 NOTE — Progress Notes (Addendum)
 PROGRESS NOTE    Meghan Jarvis  FMW:[id number] DOB: 12-24-1975 DOA: 12/08/2023 [name]  Chief Complaint  Patient presents with   Altered Mental Status    Hospital Course:  Meghan Jarvis is 48 y.o. female with history of alcohol abuse, cocaine abuse, homelessness, HIV/AIDS, atrial fibrillation, hypothyroidism, type 2 diabetes, who presented to [facility] with acute respiratory failure.  Reportedly she was found down by a friend who would not wake up.  She was hypoxic in the 70s on arrival. She was intubated and admitted by the critical care service.  She was started on antibiotics for presumed aspiration pneumonia.  Urine tox positive for cocaine.  Patient was successfully extubated on 12/30 and transferred to [facility] on 1/1.  Patient's stay has then been prolonged pending rehab placement and complaints of various pain.  Did complain of chest pain but cardiac enzymes were negative x 2 and EKG did not reveal any ST elevations.  She also complained of abdominal pain, right upper quadrant ultrasound negative.  She complained of a headache, MRI brain negative for any toxoplasmosis lesions but did show advanced cerebral atrophy.  L1/5 patient had some hypotension which was responsive to 2 fluid boluses and midodrine .  Patient continued to complain of abdominal pain on 1/6 that she underwent CT scan which was negative for acute process and in fact revealed better aeration of right lower lung.  It did reveal some constipation so she was started on bowel regimen.  Subjective: No acute events overnight.  The patient is agitated this morning during evaluation.  She will answer some questions but is confused and requires significant prompting and redirection.  Objective: Vitals:   12/24/23 2049 12/25/23 0500 12/25/23 0503 12/25/23 0715  BP: 91/65  101/68 (!) 88/57  Pulse: 93  88 62  Resp: 17  18 18   Temp: 98.5 F (36.9 C)  97.7 F (36.5 C) 98.7 F (37.1 C)  TempSrc:      SpO2:  100%  96% 93%  Weight:  51.7 kg    Height:        Intake/Output Summary (Last 24 hours) at 12/25/2023 0808 Last data filed at 12/25/2023 0150 Gross per 24 hour  Intake --  Output 150 ml  Net -150 ml    Filed Weights   12/23/23 0907 12/24/23 0245 12/25/23 0500  Weight: 49.5 kg 51.1 kg 51.7 kg    Examination: General exam: laying in bed. NAD.   Respiratory system: No work of breathing, symmetric chest wall expansion Cardiovascular system: S1 & S2 heard, RRR.  Gastrointestinal system: right-sided flank tenderness to palpation. Neuro: Alert, disoriented, requires significant prompting to follow commands.  Is unable to consistently report back what is said to her. Extremities: Symmetric Skin: rashes and lesions in various stages of healing, some surrounding excoriations, small red lesions all over lower extremities improved from prior Psychiatry: calm, no agitation.    Assessment & Plan:  Principal Problem:   Abdominal pain Active Problems:   Hypotension   Acute hypoxic respiratory failure (HCC)   Severe sepsis (HCC)   Pneumonia of right lower lobe due to infectious organism   Acute metabolic encephalopathy   AIDS (HCC)   Protein-calorie malnutrition, severe   Pancytopenia (HCC)   Headache   Cocaine abuse (HCC)   Right upper quadrant abdominal pain   Alcohol abuse   Uncontrolled type 2 diabetes mellitus with hypoglycemia, without long-term current use of insulin  (HCC)   Chest pain   Generalized weakness  Diarrhea   Underweight (BMI < 18.5)   Encounter for psychiatric assessment   Rash due to allergy  New Fever - x1 on 1/11 -- UA ordered, pending still. Have request RN to follow up again - CTAP without acute abnormality.  Reveals some constipation, have added lactulose  - If fever recurs will panculture  Acute hypoxic respiratory failure, resolved - Secondary to drug overdose and aspiration pneumonia - Initially managed in ICU on ventilator - Successfully  extubated 12/30 - Now on room air - Has completed antibiotic course - Monitor respiratory status closely  Fall in hospital -Did not hit her head - Still with profound weakness and difficulty with ambulation.  - thoracic and lumbar MRI without acute pathology -- Weakness likely 2/2 to HAND  Hypotension - Continue midodrine , MAP goal above 65  Severe sepsis - On admission: Tachycardia, tachypnea, fever, pneumonia. - Secondary to aspiration pneumonia as above - Status post antibiotics  Acute metabolic encephalopathy - Has improved some, at this point I suspect she is at her baseline.  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 - Initial presentation likely secondary to severe sepsis as above - MRI negative for acute pathology, does show severe cerebral atrophy - Continue frequent reorientation - Capacity is not constant, and may change on a day-to-day basis depending on a variety of factors including the patient's health and sitaution.  At this time this patient indicates that she does NOT have capacity to make decisions. She is unable to understand the risks of proposed treatment, she is unable to adequately report why she is in the hospital and what the treatment plan is even after it was explained to her moments prior. She does not demonstrate the ability to make a choice, or the ability to communicate that choice. Capacity can be reassessed when/if this patient's mental status or clinical course changes.   Aspiration pneumonia - Status post 5 days antibiotics as above  AIDS HIV - Viral load 2 million, CD4: 14 - ID consulted - Currently on PCP and MAI prophylaxis with Bactrim  and Zithromax  - Toxoplasma IgG came back elevated, MRI of the brain does not show any enhancing lesions -- Fungitell +, culture neg -- AFB still pending - Patient has been initiated on Biktarvy  - Will need close outpatient follow-up -- Keep universal precautions  Headache - Resolved -  Required IV magnesium  earlier in hospital course - Cryptococcus antigen negative  Pancytopenia - Secondary to HIV as above - Platelet count now within normal range - Continue to trend CBC  Protein calorie malnutrition, severe BMI under 18 - Continue supplements - Nutrition consult  Cocaine abuse - Has been counseled on cessation  Alcohol abuse - Now out of withdrawal window - Continue with thiamine , multivitamin, folic acid  supplementation  Controlled type 2 diabetes with hyperglycemia, without long-term use of insulin  - Hemoglobin A1c 6.0%  AKI - acute worsening 1/13 likely due to fever and brief hypotension overnight. - CMP in AM  Abdominal pain - Persistent Right flank pain very tender to palpation, all scans negative.  - Continue bowel regimen  Constipation - Continue bowel regimen as above  Generalized weakness Frequent falls - Secondary to all above conditions - PT/OT - PT recommending rehab, TOC consulted for placement  Chest pain - Atypical - Troponin x 2 negative, EKG without ST elevations  Chronic pain - Patient complains of pain all over. Work up for acute pathology as above - 1/8 Initiated Cymbalta  for chronic pain management.  Start at 30 mg daily and  titrate up as patient tolerates  Anxiety - Pt was Evaluated overnight on 1/9 and was given Ativan  due to concerns of withdrawal.  Patient had been admitted for  2 weeks.  EtOH withdrawal unlikely at this time.  UDS on arrival positive only for cocaine.  Suspect anxiety or panic attack more likely. -- PRN ativan   Sacral wound - First noted 1/12 - local wound care  Rash on lower extremities - Thought to be secondary to Bactrim  - ID has changed to mepron   Wound / Incision (Open or Dehisced) 12/23/23 Other (Comment) Sacrum Lower open skin area with redness, bleeding on sacrum; fissure related to moisture (Active)  Date First Assessed/Time First Assessed: 12/23/23 1800   Wound Type: (c) Other  (Comment)  Location: Sacrum  Location Orientation: Lower  Wound Description (Comments): open skin area with redness, bleeding on sacrum; fissure related to moisture  Presen...    Assessments 12/23/2023  5:38 PM 12/24/2023 10:00 AM  Dressing Type Foam - Lift dressing to assess site every shift Foam - Lift dressing to assess site every shift  Dressing Changed New --  Dressing Status Clean, Dry, Intact Clean, Dry, Intact  Dressing Change Frequency PRN --  Site / Wound Assessment Bleeding;Red;Painful --  Drainage Amount Moderate --  Drainage Description No odor;Other (Comment) --     No associated orders.     Wound / Incision (Open or Dehisced) 12/24/23 Non-pressure wound Buttocks Right linear partial thickness skin loss; scrached (Active)  Date First Assessed/Time First Assessed: 12/24/23 1036   Wound Type: Non-pressure wound  Location: Buttocks  Location Orientation: Right  Wound Description (Comments): linear partial thickness skin loss; scrached  Present on Admission: No    Assessments 12/24/2023 10:00 AM  Dressing Type Foam - Lift dressing to assess site every shift  Dressing Status Clean, Dry, Intact     No associated orders.        DVT prophylaxis: Lovenox    Code Status: Full Code Family Communication: none at bedside Disposition:  Status is: Inpatient, medically cleared. pending rehab placement    Consultants:  Infectious disease Procedures:  N/a  Antimicrobials:  Anti-infectives (From admission, onward)    Start     Dose/Rate Route Frequency Ordered Stop   12/25/23 0800  atovaquone  (MEPRON ) 750 MG/5ML suspension 1,500 mg        1,500 mg Oral Daily with breakfast 12/24/23 1558     12/24/23 1000  sulfamethoxazole -trimethoprim  (BACTRIM ) 400-80 MG per tablet 1 tablet  Status:  Discontinued        1 tablet Oral Daily 12/24/23 0749 12/24/23 1557   12/19/23 1400  azithromycin  (ZITHROMAX ) tablet 1,200 mg        1,200 mg Oral Weekly 12/18/23 1346     12/18/23 1000   sulfamethoxazole -trimethoprim  (BACTRIM  DS) 800-160 MG per tablet 1 tablet  Status:  Discontinued        1 tablet Oral Daily 12/17/23 1621 12/24/23 0748   12/14/23 1530  bictegravir-emtricitabine -tenofovir  AF (BIKTARVY ) 50-200-25 MG per tablet 1 tablet        1 tablet Oral Daily 12/14/23 1442     12/12/23 1000  amoxicillin -clavulanate (AUGMENTIN ) 875-125 MG per tablet 1 tablet        1 tablet Oral Every 12 hours 12/11/23 1345 12/12/23 2131   12/12/23 1000  azithromycin  (ZITHROMAX ) tablet 1,250 mg  Status:  Discontinued        1,200 mg Oral Weekly 12/11/23 1618 12/18/23 1346   12/11/23 2000  sulfamethoxazole -trimethoprim  (BACTRIM ) 400-80 MG per  tablet 1 tablet  Status:  Discontinued        1 tablet Oral Daily 12/11/23 1618 12/17/23 1621   12/09/23 2200  azithromycin  (ZITHROMAX ) 500 mg in sodium chloride  0.9 % 250 mL IVPB  Status:  Discontinued        500 mg 250 mL/hr over 60 Minutes Intravenous Every 24 hours 12/09/23 0734 12/11/23 1618   12/09/23 2000  cefTRIAXone  (ROCEPHIN ) 2 g in sodium chloride  0.9 % 100 mL IVPB  Status:  Discontinued        2 g 200 mL/hr over 30 Minutes Intravenous Every 24 hours 12/09/23 0953 12/09/23 1840   12/09/23 2000  piperacillin -tazobactam (ZOSYN ) IVPB 3.375 g        3.375 g 12.5 mL/hr over 240 Minutes Intravenous Every 8 hours 12/09/23 1850 12/12/23 0037   12/09/23 0600  Ampicillin -Sulbactam (UNASYN ) 3 g in sodium chloride  0.9 % 100 mL IVPB  Status:  Discontinued        3 g 200 mL/hr over 30 Minutes Intravenous Every 6 hours 12/09/23 0442 12/09/23 0953   12/08/23 2215  cefTRIAXone  (ROCEPHIN ) 2 g in sodium chloride  0.9 % 100 mL IVPB        2 g 200 mL/hr over 30 Minutes Intravenous Once 12/08/23 2214 12/08/23 2308   12/08/23 2215  azithromycin  (ZITHROMAX ) 500 mg in sodium chloride  0.9 % 250 mL IVPB        500 mg 250 mL/hr over 60 Minutes Intravenous  Once 12/08/23 2214 12/09/23 0000       Data Reviewed: I have personally reviewed following labs and imaging  studies CBC: Recent Labs  Lab 12/21/23 0617 12/22/23 0559 12/23/23 0538 12/24/23 0619 12/25/23 0701  WBC 3.9* 4.8 5.8 5.5 5.3  NEUTROABS 2.4 3.1 3.8 3.3 3.4  HGB 9.1* 9.2* 9.8* 9.6* 8.8*  HCT 29.3* 28.9* 30.7* 29.9* 28.5*  MCV 86.4 83.5 84.1 83.1 87.2  PLT 346 382 393 383 308   Basic Metabolic Panel: Recent Labs  Lab 12/21/23 0617 12/22/23 0559 12/23/23 0538 12/24/23 0619 12/25/23 0701  NA 140 137 135 136 142  K 4.6 4.6 4.7 4.8 4.5  CL 106 103 99 101 106  CO2 25 24 23 23 27   GLUCOSE 77 78 97 93 89  BUN 19 20 25* 38* 34*  CREATININE 0.96 0.93 1.13* 1.38* 1.04*  CALCIUM  9.5 9.6 9.9 9.4 9.5  MG 2.2 2.2 2.3 2.4 2.1  PHOS 5.1* 5.2* 5.3* 5.3* 4.9*   GFR: Estimated Creatinine Clearance: 53.4 mL/min (A) (by C-G formula based on SCr of 1.04 mg/dL (H)). Liver Function Tests: Recent Labs  Lab 12/21/23 0617 12/22/23 0559 12/23/23 0538 12/24/23 0619 12/25/23 0701  AST 26 22 32 33 32  ALT 31 27 31 27 28   ALKPHOS 52 59 71 61 64  BILITOT 0.5 0.4 0.6 0.5 0.2  PROT 7.9 8.2* 9.1* 8.4* 8.0  ALBUMIN 3.0* 3.0* 3.5 3.2* 3.0*   CBG: No results for input(s): GLUCAP in the last 168 hours.   No results found for this or any previous visit (from the past 240 hours).     Radiology Studies: CT ABDOMEN PELVIS W CONTRAST Result Date: 12/23/2023 CLINICAL DATA:  Provided history: Flank pain, US  neg or equivocal (Ped 0-17y) persistent right flank pain, has AIDS and fever. suspect infection, pyelo vs abdominal abcess?SABRA aware Prior scans negative. EXAM: CT ABDOMEN AND PELVIS WITH CONTRAST TECHNIQUE: Multidetector CT imaging of the abdomen and pelvis was performed using the standard protocol following bolus administration of  intravenous contrast. RADIATION DOSE REDUCTION: This exam was performed according to the departmental dose-optimization program which includes automated exposure control, adjustment of the mA and/or kV according to patient size and/or use of iterative reconstruction  technique. CONTRAST:  75mL OMNIPAQUE  IOHEXOL  300 MG/ML  SOLN COMPARISON:  CT 12/17/2023 FINDINGS: Lower chest: Breathing motion artifact. Resolution of the previous right lung base opacity. New patchy and ground-glass consolidation in the medial left lower lobe series 4, image 48. Hepatobiliary: No focal liver abnormality is seen. No gallstones, gallbladder wall thickening, or biliary dilatation. The gallbladder is physiologically distended. Pancreas: No ductal dilatation or inflammation. Spleen: Normal in size without focal abnormality. Adrenals/Urinary Tract: No adrenal nodule. No hydronephrosis. No renal calculi. Allowing for motion, there is homogeneous renal enhancement. No focal renal abnormality. Unremarkable urinary bladder, no bladder wall thickening. Stomach/Bowel: Bowel assessment is limited in the absence of enteric contrast and paucity of intra-abdominal fat. Motion further limits assessment. The stomach is nondistended. No small bowel distension or obstruction. There is no evidence of small bowel inflammation appendectomy. Moderate volume of stool throughout the colon. There is no colonic wall thickening or pericolonic edema. Question of rectal wall thickening series 2, image 80, but no perirectal fat stranding. Vascular/Lymphatic: Aortic atherosclerosis. No aneurysm. Circumaortic left renal vein. The portal vein is patent. Reproductive: The uterus is not seen.  No adnexal mass. Other: No free air or ascites. Bubble calcified mesh at the umbilicus, chronic and unchanged. There is mild body wall edema. Musculoskeletal: There are no acute or suspicious osseous abnormalities. Spine recently assessed with MRI. There is mild edema and hyperemia within the right gluteal musculature, series 2, image 67, but no drainable collection. IMPRESSION: 1. New patchy airspace disease in the medial left lower lobe suspicious for pneumonia. 2. Intramuscular edema and hyperemia within the right gluteal musculature may  represent myositis. No drainable collection. 3. Equivocal rectal wall thickening versus nondistention. Recommend correlation with physical exam. 4. Moderate colonic stool burden. Electronically Signed   By: Andrea Gasman M.D.   On: 12/23/2023 18:15     Scheduled Meds:  atovaquone   1,500 mg Oral Q breakfast   azithromycin   1,200 mg Oral Weekly   bictegravir-emtricitabine -tenofovir  AF  1 tablet Oral Daily   DULoxetine   30 mg Oral QHS   enoxaparin  (LOVENOX ) injection  40 mg Subcutaneous QHS   feeding supplement  237 mL Oral TID BM   folic acid   1 mg Oral Daily   lactulose   10 g Oral TID   midodrine   10 mg Oral TID WC   multivitamin with minerals  1 tablet Oral QHS   pantoprazole   40 mg Oral BID   polyethylene glycol  17 g Oral Daily   thiamine   100 mg Oral Daily   Continuous Infusions:   LOS: 17 days    Time spent:  55min  Meghan Granholm, DO [facility]  To contact the attending physician between 7A-7P please use Epic Chat. To contact the covering physician during after hours 7P-7A, please review Amion.   12/25/2023, 8:08 AM   *This document has been created with the assistance of dictation software. Please excuse typographical errors. *

## 2023-12-25 NOTE — Progress Notes (Signed)
 Date of Admission:  12/08/2023     ID: Meghan Jarvis is a 48 y.o. female  Principal Problem:   Abdominal pain Active Problems:   Alcohol abuse   Acute hypoxic respiratory failure (HCC)   Pneumonia of right lower lobe due to infectious organism   Protein-calorie malnutrition, severe   Acute metabolic encephalopathy   Uncontrolled type 2 diabetes mellitus with hypoglycemia, without long-term current use of insulin  (HCC)   Cocaine abuse (HCC)   AIDS (HCC)   Severe sepsis (HCC)   Chest pain   Pancytopenia (HCC)   Generalized weakness   Diarrhea   Right upper quadrant abdominal pain   Headache   Underweight (BMI < 18.5)   Hypotension   Encounter for psychiatric assessment   Rash due to allergy  ? Meghan Jarvis is a 48 y.o. with a history of AIDS, not compliant with meds or visits ,  , cocaine use was brought in by EMS after being found unresponsive on the couch  in an aquaintance place-   Subjective: No fever in 4 days Doing okay Itching better  Medications:   atovaquone   1,500 mg Oral Q breakfast   azithromycin   1,200 mg Oral Weekly   bictegravir-emtricitabine -tenofovir  AF  1 tablet Oral Daily   DULoxetine   30 mg Oral QHS   enoxaparin  (LOVENOX ) injection  40 mg Subcutaneous QHS   feeding supplement  237 mL Oral TID BM   folic acid   1 mg Oral Daily   lactulose   10 g Oral TID   midodrine   10 mg Oral TID WC   multivitamin with minerals  1 tablet Oral QHS   pantoprazole   40 mg Oral BID   polyethylene glycol  17 g Oral Daily   thiamine   100 mg Oral Daily    Objective: Vital signs in last 24 hours: Patient Vitals for the past 24 hrs:  BP Temp Pulse Resp SpO2 Weight  12/25/23 1646 94/64 98.9 F (37.2 C) -- 16 100 % --  12/25/23 0715 (!) 88/57 98.7 F (37.1 C) 62 18 93 % --  12/25/23 0503 101/68 97.7 F (36.5 C) 88 18 96 % --  12/25/23 0500 -- -- -- -- -- 51.7 kg  12/24/23 2049 91/65 98.5 F (36.9 C) 93 17 100 % --    Awake and alert Chest b/l air  entry [id number] Skin- erythematous rash over buttocks and thighs and legs- excoriations papular eruption CNS grossly non focal   Lab Results    Latest Ref Rng & Units 12/25/2023    7:01 AM 12/24/2023    6:19 AM 12/23/2023    5:38 AM  CBC  WBC 4.0 - 10.5 K/uL 5.3  5.5  5.8   Hemoglobin 12.0 - 15.0 g/dL 8.8  9.6  9.8   Hematocrit 36.0 - 46.0 % 28.5  29.9  30.7   Platelets 150 - 400 K/uL 308  383  393        Latest Ref Rng & Units 12/25/2023    7:01 AM 12/24/2023    6:19 AM 12/23/2023    5:38 AM  CMP  Glucose 70 - 99 mg/dL 89  93  97   BUN 6 - 20 mg/dL 34  38  25   Creatinine 0.44 - 1.00 mg/dL 8.95  8.61  8.86   Sodium 135 - 145 mmol/L 142  136  135   Potassium 3.5 - 5.1 mmol/L 4.5  4.8  4.7   Chloride 98 - 111 mmol/L 106  101  99   CO2 22 - 32 mmol/L 27  23  23    Calcium  8.9 - 10.3 mg/dL 9.5  9.4  9.9   Total Protein 6.5 - 8.1 g/dL 8.0  8.4  9.1   Total Bilirubin 0.0 - 1.2 mg/dL 0.2  0.5  0.6   Alkaline Phos 38 - 126 U/L 64  61  71   AST 15 - 41 U/L 32  33  32   ALT 0 - 44 U/L 28  27  31        Microbiology: 12/28 BC NG  Toxo IgG > 400 Toxo PCR neg Toxo igM neg RPR NR CMV DNA neg Crypto neg HIV RNA 2 million Cd4 is 14 ( 2.4%) Beta D glucan > 500 ( zosyn  could have increased it- will repeat) Histoplasma neg Fungal antibodies pending Genosure prime- no resistant mutations     Assessment/Plan: Unresponsive /AMS secondary to cocaine use- resolved   Aspiration leading to acute hypoxic resp failure rt Middle lobe and lower lobe consolidation-- was intubated and mechanically ventilated in the beginning. Now extubated Was On zosyn  Completed rx Repeat CXR clear   Rt sided abdominl flank, chest pain- CT abd ok- MRI spine deg changes- non acute  Rash- likely due to bactrim - DC and start mepron    H/o fall with recurrent ED presentation- had hurt left shoulder before   Left thumb tender swelling- - resolved   Pruritus with clear blisters- resolved- no new one D.D   Cocaine use Insect bite Scabies- no burrows R/o primary dermatological condition like pemphigus ( less likely)  Syphilis- RPR neg   AIDS- non compliant with meds or visits to her Provider- not been in care since 2021- used to be followed at [facility] before Was on Biktarvy  at one time and was non compliant then VL now is 2 million and cd4 is 11  Started Biktarvy  on 12/14/23 . not sure how much compliance to be expected as her social situation is not conducive and so is her substance use- watch closely for Immune reconstitution inflammatory syndrome Genotype no resistance to NRTI, NNRTI, Integrase inhibitor and PI   Toxo IgG very high-  MRI brain no CNS lesions  . TOXO PCR negative On  PCP and MAI prophylaxis. 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

## 2023-12-25 NOTE — Plan of Care (Signed)
  Problem: Education: Goal: Ability to describe self-care measures that may prevent or decrease complications (Diabetes Survival Skills Education) will improve Outcome: Progressing Goal: Individualized Educational Video(s) Outcome: Progressing   Problem: Coping: Goal: Ability to adjust to condition or change in health will improve Outcome: Progressing   Problem: Fluid Volume: Goal: Ability to maintain a balanced intake and output will improve Outcome: Progressing   Problem: Health Behavior/Discharge Planning: Goal: Ability to identify and utilize available resources and services will improve Outcome: Progressing Goal: Ability to manage health-related needs will improve Outcome: Progressing   Problem: Metabolic: Goal: Ability to maintain appropriate glucose levels will improve Outcome: Progressing   Problem: Nutritional: Goal: Maintenance of adequate nutrition will improve Outcome: Progressing Goal: Progress toward achieving an optimal weight will improve Outcome: Progressing   Problem: Skin Integrity: Goal: Risk for impaired skin integrity will decrease Outcome: Progressing   Problem: Tissue Perfusion: Goal: Adequacy of tissue perfusion will improve Outcome: Progressing   Problem: Education: Goal: Knowledge of General Education information will improve Description: Including pain rating scale, medication(s)/side effects and non-pharmacologic comfort measures Outcome: Progressing   Problem: Health Behavior/Discharge Planning: Goal: Ability to manage health-related needs will improve Outcome: Progressing   Problem: Health Behavior/Discharge Planning: Goal: Ability to manage health-related needs will improve Outcome: Progressing   Problem: Clinical Measurements: Goal: Ability to maintain clinical measurements within normal limits will improve Outcome: Progressing Goal: Will remain free from infection Outcome: Progressing Goal: Diagnostic test results will  improve Outcome: Progressing Goal: Respiratory complications will improve Outcome: Progressing Goal: Cardiovascular complication will be avoided Outcome: Progressing   Problem: Activity: Goal: Risk for activity intolerance will decrease Outcome: Progressing   Problem: Nutrition: Goal: Adequate nutrition will be maintained Outcome: Progressing   Problem: Elimination: Goal: Will not experience complications related to bowel motility Outcome: Progressing Goal: Will not experience complications related to urinary retention Outcome: Progressing   Problem: Pain Management: Goal: General experience of comfort will improve Outcome: Progressing   Problem: Safety: Goal: Ability to remain free from injury will improve Outcome: Progressing   Problem: Skin Integrity: Goal: Risk for impaired skin integrity will decrease Outcome: Progressing   Problem: Activity: Goal: Ability to tolerate increased activity will improve Outcome: Progressing   Problem: Respiratory: Goal: Ability to maintain a clear airway and adequate ventilation will improve Outcome: Progressing

## 2023-12-25 NOTE — Progress Notes (Signed)
 Patient oriented to self only with increased confusion throughout day.  Patient argumentative with all nursing tasks resulting in no wound care this shift.

## 2023-12-25 NOTE — TOC Progression Note (Addendum)
 Transition of Care [facility]) - Progression Note    Patient Details  Name: Meghan Jarvis MRN: [id number] Date of Birth: 1976/08/02  [facility]) CM/SW Contact  Ladene Lady, LCSW Phone Number: 12/25/2023, 12:11 PM  Clinical Narrative:   Per MD she does not believe pt has decisional capacity and will reflect this in her note today. CSW contacted pts friend who states the patient dates her son ronnie. She states pt has a daughter but no one has been able to reach her. She gave me the number of another friend Quasha(angel) [phone] who  may know more on how to reach daughter.  CSW has LVM with friend above in hopes to locate daughter.  APS report made due to be unable to contact family and possible need for guardian.        Expected Discharge Plan and Services                                               Social Determinants of Health (SDOH) Interventions SDOH Screenings   Food Insecurity: Patient Unable To Answer (12/09/2023)  Recent Concern: Food Insecurity - Food Insecurity Present (09/26/2023)  Housing: Patient Unable To Answer (12/09/2023)  Recent Concern: Housing - Medium Risk (09/26/2023)  Transportation Needs: Patient Unable To Answer (12/09/2023)  Recent Concern: Transportation Needs - Unmet Transportation Needs (10/11/2023)  Utilities: Patient Unable To Answer (12/09/2023)  Recent Concern: Utilities - At Risk (09/25/2023)  Tobacco Use: High Risk (12/08/2023)    Readmission Risk Interventions     No data to display

## 2023-12-25 NOTE — Progress Notes (Signed)
 Physical Therapy Treatment Patient Details Name: Meghan Jarvis MRN: [id number] DOB: 26-Aug-1976 Today's Date: 12/25/2023   History of Present Illness Pt is a 48 year old female admitted with [facility] with acute hypoxic respiratory failure, severe sepsis, community acquired PNA acute metabolic encephalopathy; She was intubated, extubated 12/30    PMH significant for alcohol abuse, cocaine abuse, homelessness, AIDS, atrial fibrillation, hypothyroidism, type 2 diabetes mellitus    PT Comments  Responded to bed alarm going off in room; patient noted to be standing at bedside, requesting new gown as therapist arrived. Assisted to supported position and addressed needs.  Requires min/mod assist for upper/lower body dressing; difficulty sequencing/coordinating multi-step tasks.  Requires min assist for all transitional movements and standing activities; poor dynamic balance noted.  Does frequently reach for counter/bedrails for external stablization; recommend continued use of RW for additional mobility efforts. Additional gait deferred this date, as patient wishing to eat breakfast.  Set up with needs in reach, alarm in place.  Will continue to progress mobility next session as appropriate.   If plan is discharge home, recommend the following: Assistance with cooking/housework;Direct supervision/assist for financial management;Direct supervision/assist for medications management;Assist for transportation;Help with stairs or ramp for entrance;Supervision due to cognitive status;A little help with walking and/or transfers;A little help with bathing/dressing/bathroom   Can travel by private vehicle     Yes  Equipment Recommendations  Rolling walker (2 wheels)    Recommendations for Other Services       Precautions / Restrictions Precautions Precautions: Fall Restrictions Weight Bearing Restrictions Per Provider Order: No     Mobility  Bed Mobility Overal bed mobility: Needs Assistance Bed  Mobility: Sit to Supine       Sit to supine: Supervision   General bed mobility comments: increased effort to elevate bilat LEs into bed    Transfers Overall transfer level: Needs assistance Equipment used: None Transfers: Sit to/from Stand Sit to Stand: Min assist, Contact guard assist           General transfer comment: UE on bedrail to assist with transfer; maintains posterior surface against edge of bed to stabilize with standing    Ambulation/Gait               General Gait Details: deferred this session   Stairs             Wheelchair Mobility     Tilt Bed    Modified Rankin (Stroke Patients Only)       Balance Overall balance assessment: Needs assistance Sitting-balance support: No upper extremity supported, Feet supported Sitting balance-Leahy Scale: Fair Sitting balance - Comments: moves in/out of figure-4 position during functional activity, but does require intermittent UE support to stabilize   Standing balance support: No upper extremity supported Standing balance-Leahy Scale: Poor                              Cognition Arousal: Alert Behavior During Therapy: Flat affect Overall Cognitive Status: No family/caregiver present to determine baseline cognitive functioning                                          Exercises Other Exercises Other Exercises: Upper body dressing, min assist; lower body dressing, mod assist to thread undergarments over LEs, min assist for standing balance to pull over hips.  Does requires  external stabilization to maintain balance with all standing activities.    General Comments        Pertinent Vitals/Pain Pain Assessment Pain Assessment: No/denies pain    Home Living                          Prior Function            PT Goals (current goals can now be found in the care plan section) Acute Rehab PT Goals Patient Stated Goal: to feel better PT Goal  Formulation: With patient Time For Goal Achievement: 12/26/23 Potential to Achieve Goals: Fair Progress towards PT goals: Progressing toward goals    Frequency    Min 1X/week      PT Plan      Co-evaluation              AM-PAC PT 6 Clicks Mobility   Outcome Measure  Help needed turning from your back to your side while in a flat bed without using bedrails?: None Help needed moving from lying on your back to sitting on the side of a flat bed without using bedrails?: A Little Help needed moving to and from a bed to a chair (including a wheelchair)?: A Little Help needed standing up from a chair using your arms (e.g., wheelchair or bedside chair)?: A Little Help needed to walk in hospital room?: A Little Help needed climbing 3-5 steps with a railing? : A Lot 6 Click Score: 18    End of Session Equipment Utilized During Treatment: Gait belt Activity Tolerance: Patient tolerated treatment well Patient left: in bed;with call bell/phone within reach;with bed alarm set Nurse Communication: Mobility status PT Visit Diagnosis: Other abnormalities of gait and mobility (R26.89);Difficulty in walking, not elsewhere classified (R26.2);Muscle weakness (generalized) (M62.81)     Time: 1035-1050 PT Time Calculation (min) (ACUTE ONLY): 15 min  Charges:    $Therapeutic Activity: 8-22 mins PT General Charges $$ ACUTE PT VISIT: 1 Visit                    Kazi Montoro H. Delores, PT, DPT, NCS 12/25/23, 11:22 AM [phone]

## 2023-12-26 ENCOUNTER — Inpatient Hospital Stay: Payer: MEDICAID

## 2023-12-26 DIAGNOSIS — N179 Acute kidney failure, unspecified: Secondary | ICD-10-CM

## 2023-12-26 DIAGNOSIS — I952 Hypotension due to drugs: Secondary | ICD-10-CM

## 2023-12-26 DIAGNOSIS — G9341 Metabolic encephalopathy: Secondary | ICD-10-CM | POA: Diagnosis not present

## 2023-12-26 DIAGNOSIS — J189 Pneumonia, unspecified organism: Secondary | ICD-10-CM | POA: Diagnosis not present

## 2023-12-26 DIAGNOSIS — R1010 Upper abdominal pain, unspecified: Secondary | ICD-10-CM

## 2023-12-26 DIAGNOSIS — M25561 Pain in right knee: Secondary | ICD-10-CM

## 2023-12-26 DIAGNOSIS — B20 Human immunodeficiency virus [HIV] disease: Secondary | ICD-10-CM | POA: Diagnosis not present

## 2023-12-26 LAB — COMPREHENSIVE METABOLIC PANEL
ALT: 25 U/L (ref 0–44)
AST: 24 U/L (ref 15–41)
Albumin: 3.1 g/dL — ABNORMAL LOW (ref 3.5–5.0)
Alkaline Phosphatase: 67 U/L (ref 38–126)
Anion gap: 9 (ref 5–15)
BUN: 34 mg/dL — ABNORMAL HIGH (ref 6–20)
CO2: 27 mmol/L (ref 22–32)
Calcium: 9.5 mg/dL (ref 8.9–10.3)
Chloride: 105 mmol/L (ref 98–111)
Creatinine, Ser: 0.91 mg/dL (ref 0.44–1.00)
GFR, Estimated: >60 mL/min (ref >60)
Glucose, Bld: 101 mg/dL — ABNORMAL HIGH (ref 70–99)
Potassium: 3.9 mmol/L (ref 3.5–5.1)
Sodium: 141 mmol/L (ref 135–145)
Total Bilirubin: 0.4 mg/dL (ref 0.0–1.2)
Total Protein: 8.4 g/dL — ABNORMAL HIGH (ref 6.5–8.1)

## 2023-12-26 LAB — URIC ACID: Uric Acid, Serum: 4.4 mg/dL (ref 2.5–7.1)

## 2023-12-26 LAB — CBC WITH DIFFERENTIAL/PLATELET
Abs Immature Granulocytes: 0.01 10*3/uL (ref 0.00–0.07)
Basophils Absolute: 0 10*3/uL (ref 0.0–0.1)
Basophils Relative: 1 %
Eosinophils Absolute: 0.6 10*3/uL — ABNORMAL HIGH (ref 0.0–0.5)
Eosinophils Relative: 10 %
HCT: 29.7 % — ABNORMAL LOW (ref 36.0–46.0)
Hemoglobin: 9 g/dL — ABNORMAL LOW (ref 12.0–15.0)
Immature Granulocytes: 0 %
Lymphocytes Relative: 22 %
Lymphs Abs: 1.2 10*3/uL (ref 0.7–4.0)
MCH: 26.3 pg (ref 26.0–34.0)
MCHC: 30.3 g/dL (ref 30.0–36.0)
MCV: 86.8 fL (ref 80.0–100.0)
Monocytes Absolute: 0.5 10*3/uL (ref 0.1–1.0)
Monocytes Relative: 8 %
Neutro Abs: 3.4 10*3/uL (ref 1.7–7.7)
Neutrophils Relative %: 59 %
Platelets: 372 10*3/uL (ref 150–400)
RBC: 3.42 MIL/uL — ABNORMAL LOW (ref 3.87–5.11)
RDW: 15.1 % (ref 11.5–15.5)
Smear Review: NORMAL
WBC: 5.7 10*3/uL (ref 4.0–10.5)
nRBC: 0 % (ref 0.0–0.2)

## 2023-12-26 LAB — MAGNESIUM: Magnesium: 2.2 mg/dL (ref 1.7–2.4)

## 2023-12-26 LAB — PHOSPHORUS: Phosphorus: 4.3 mg/dL (ref 2.5–4.6)

## 2023-12-26 MED ORDER — HYDROCERIN EX CREA
TOPICAL_CREAM | Freq: Two times a day (BID) | CUTANEOUS | Status: DC
  Administered 2024-01-08 – 2024-04-08 (×10): 1 via TOPICAL
  Filled 2023-12-26: qty 107
  Filled 2023-12-26 (×4): qty 113
  Filled 2023-12-26: qty 107

## 2023-12-26 MED ORDER — MIDODRINE HCL 5 MG PO TABS
5.0000 mg | ORAL_TABLET | Freq: Three times a day (TID) | ORAL | Status: DC
  Administered 2023-12-26 – 2023-12-30 (×12): 5 mg via ORAL
  Filled 2023-12-26 (×12): qty 1

## 2023-12-26 NOTE — Plan of Care (Signed)
  Problem: Coping: Goal: Ability to adjust to condition or change in health will improve Outcome: Progressing   Problem: Nutritional: Goal: Maintenance of adequate nutrition will improve Outcome: Progressing Goal: Progress toward achieving an optimal weight will improve Outcome: Progressing   Problem: Skin Integrity: Goal: Risk for impaired skin integrity will decrease Outcome: Progressing   Problem: Education: Goal: Knowledge of General Education information will improve Description: Including pain rating scale, medication(s)/side effects and non-pharmacologic comfort measures Outcome: Progressing   Problem: Pain Management: Goal: General experience of comfort will improve Outcome: Progressing   Problem: Safety: Goal: Ability to remain free from injury will improve Outcome: Progressing

## 2023-12-26 NOTE — Assessment & Plan Note (Addendum)
Creatinine went up to 1.38 on 1/13.  Creatinine down to 0.75 on 1/20.

## 2023-12-26 NOTE — Progress Notes (Signed)
 Nutrition Follow-up  DOCUMENTATION CODES:   Severe malnutrition in context of chronic illness  INTERVENTION:   -Continue Ensure Enlive po TID, each supplement provides 350 kcal and 20 grams of protein.  -Continue MVI with minerals daily -Continue 1 mg folic acid  daily -Continue 100 mg thiamine  daily -Continue Magic cup TID with meals, each supplement provides 290 kcal and 9 grams of protein   NUTRITION DIAGNOSIS:   Severe Malnutrition related to chronic illness (HIV, homelessness, substance abuse) as evidenced by percent weight loss, severe fat depletion, severe muscle depletion.  Ongoing  GOAL:   Patient will meet greater than or equal to 90% of their needs  Progressing   MONITOR:   PO intake, Supplement acceptance, Labs, Weight trends, I & O's, Skin  REASON FOR ASSESSMENT:   Consult Enteral/tube feeding initiation and management  ASSESSMENT:   48 y/o female with h/o homelessness, HIV, substance abuse, Afib, mood disorder, hypothyroidism, type 2 diabetes mellitus and depression who is admitted with aspiration PNA.  Reviewed I/O's: 0 ml x 24 hours and -627 ml since 12/12/23  Per [facility]OCN notes on 12/24/23, pt with fissure wound to gluteal cleft, partial thickness rt lateral knee abrasion, and excoriation to linear rt buttock.  Per psych note on 12/24/23, pt lacks capacity to make healthcare decisions.   Per nursing, pt with some confusion and often needs redirecting.   Pt enjoys sweets and likes consuming [employer] and Ensure.   Wt has been stable over the past week.   Per TOC notes, APS report made to inability to locate family and possible need for guardianship.   Medications reviewed and include biktarvy , folic acid , miralax , and thiamine .   Labs reviewed: CBGS: 103 (inpatient orders for glycemic control are none).    Diet Order:   Diet Order             DIET DYS 3 Room service appropriate? Yes; Fluid consistency: Thin  Diet effective now                    EDUCATION NEEDS:   Not appropriate for education at this time  Skin:  Skin Assessment: Skin Integrity Issues: Skin Integrity Issues:: Other (Comment) Other: fissure wound to gluteal cleft, partial thickness rt lateral knee abrasion, excoriation to linear rt buttock  Last BM:  12/25/23  Height:   Ht Readings from Last 1 Encounters:  12/09/23 5' 7.99" (1.727 m)    Weight:   Wt Readings from Last 1 Encounters:  12/25/23 51.7 kg    Ideal Body Weight:  63.6 kg  BMI:  Body mass index is 17.33 kg/m.  Estimated Nutritional Needs:   Kcal:  1800-2100kcal/day  Protein:  90-105g/day  Fluid:  1.8-2.1L/day    Herschel Lords, RD, LDN, CDCES Registered Dietitian III Certified Diabetes Care and Education Specialist If unable to reach this RD, please use "RD Inpatient" group chat on secure chat between hours of 8am-4 pm daily

## 2023-12-26 NOTE — Assessment & Plan Note (Addendum)
Patient asked for Ace bandage.  Right knee x-ray negative.  Uric acid 4.4.  Continue to monitor.

## 2023-12-26 NOTE — Progress Notes (Signed)
 Mobility Specialist - Progress Note   12/26/23 1300  Mobility  Activity Transferred from chair to bed  Level of Assistance Minimal assist, patient does 75% or more  Assistive Device Other (Comment)  Activity Response Tolerated well  Mobility visit 1 Mobility     Pt sitting in recliner upon arrival, utilizing RA. Requesting return to bed. Pt sitting on chair legs on arrival and was cued to scoot back into chair before letting leg rest down. STS and SPT with minA. 1 mild LOB corrected with CGA. Pt left in bed with alarm set, needs in reach.    Searcy Czech Mobility Specialist 12/26/23, 1:02 PM

## 2023-12-26 NOTE — Progress Notes (Signed)
 Progress Note   Patient: Meghan Jarvis:[id number] DOB: 09/19/1976 DOA: 12/08/2023     18 DOS: the patient was seen and examined on 12/26/2023   Brief hospital course: 48 year old female past medical history of alcohol abuse, cocaine abuse, homelessness, AIDS, atrial fibrillation, hypothyroidism, type 2 diabetes mellitus presented to [facility] with acute respiratory failure.  She was found down by a friend and could not wake her up.  She was found to be hypoxic in the 70s.  Since she was obtunded she was intubated and admitted by the critical care service.  He was started on antibiotics for pneumonia.  Urine toxicology positive for cocaine.  Patient extubated on 12/30.  12/12/2023.  Patient feeling okay.  Spoke with patient's friend Heidi Llamas on the phone.  The patient is dating her son. 1/2.  Patient complained of chest pain.  Cardiac enzymes negative x 2 and EKG did not show any ST elevation. 1/3.  Patient complaining of abdominal pain.  Will get right upper quadrant ultrasound negative.  MRI of the brain did not show any toxoplasmosis lesions but did show advanced cerebral atrophy. 1/4.  Patient complaining of headache.  Will give IV magnesium .  Patient walked 3 feet with physical therapy. 1/5.  Blood pressure this morning was low and 2 fluid boluses given.  Started on midodrine . 1/6.  Patient complaining of a lot of abdominal pain.  CT scan does not show any acute process and shows better aeration of right lower lung.  CT did show constipation.  Will start MiraLAX . 1/7.  Patient feeling good today.  Did not complain of any pain.  Hemoglobin 9.1, platelet count up at 311, white blood count 4.1  1/15.  We do not have a TOC plan at this point of time.  TOC reached out to APS.  Patient complains of right knee pain.  Uric acid 4.4.  Assessment and Plan: * Acute metabolic encephalopathy Mental status improved from admission but patient is not the best historian and does not have the best  capacity to make decisions at this time.  APS referral by TOC.  MRI did not show any enhancing lesions but did show severe cerebral atrophy.  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  Hypotension Will decrease dose of midodrine .  AIDS (HCC) Viral load 2 million, CD4 14.  Infectious disease following.  Placed on PCP and MAI prophylaxis with atovaquone  and Zithromax .  Toxoplasma antibody IgG came back elevated and MRI of the brain does not show any enhancing lesions.  Infectious disease specialist started on Biktarvy .  Headache Resolved.  IV magnesium  earlier in hospital course.  Cryptococcus antigen negative.  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  Platelet platelet count up in the normal range at 372, white blood cell count up in the normal range at 5.7, hemoglobin 9.0.  Protein-calorie malnutrition, severe Continue supplements  Severe sepsis (HCC) Present on admission with tachycardia, tachypnea, fever, pneumonia, acute respiratory failure and acute metabolic encephalopathy.  Patient completed antibiotics.  Cocaine abuse (HCC) Must stop cocaine  Pneumonia of right lower lobe due to infectious organism Completed 5 days of antibiotic.  Acute hypoxic respiratory failure (HCC) Resolved.  Patient extubated on 12/30.  Patient currently breathing on room air.  Alcohol abuse Continue thiamine  multivitamin and folic acid   Abdominal pain Resolved.  Likely related to constipation.  Continue MiraLAX .  Earlier in the hospital course had right upper quadrant pain with an ultrasound of the right upper quadrant was negative.  CT scan showing constipation.  Uncontrolled  type 2 diabetes mellitus with hypoglycemia, without long-term current use of insulin  (HCC) Hemoglobin A1c 6.0.   Generalized weakness Physical therapy recommending rehab  Chest pain Atypical in nature.  Troponin x 2 negative.  EKG did not show any ST elevations.  AKI (acute kidney injury) (HCC) Creatinine went up to  1.38 on 1/13.  Creatinine down to 0.91 on 1/15.  Right knee pain Right knee x-ray ordered.  Uric acid 4.4.  Continue to monitor.  Underweight (BMI < 18.5) Last BMI 17.33  Diarrhea Likely overflow constipation causing her diarrhea.        Subjective: Today patient complains of right knee pain.  No history of gout.  Able to answer some simple questions.  Physical Exam: Vitals:   12/25/23 1646 12/25/23 2126 12/26/23 0538 12/26/23 0854  BP: 94/64 97/64 99/69  (!) 131/113  Pulse:  98 93 92  Resp: 16   16  Temp: 98.9 F (37.2 C) 99.2 F (37.3 C) 98.5 F (36.9 C) (!) 97.3 F (36.3 C)  TempSrc:      SpO2: 100% 99% 100%   Weight:      Height:       Physical Exam HENT:     Head: Normocephalic.  Eyes:     General: Lids are normal.     Conjunctiva/sclera: Conjunctivae normal.  Cardiovascular:     Rate and Rhythm: Normal rate and regular rhythm.     Heart sounds: Normal heart sounds, S1 normal and S2 normal.  Pulmonary:     Breath sounds: Examination of the right-lower field reveals decreased breath sounds. Examination of the left-lower field reveals decreased breath sounds. Decreased breath sounds present. No wheezing, rhonchi or rales.  Abdominal:     Palpations: Abdomen is soft.     Tenderness: There is no abdominal tenderness.  Musculoskeletal:     Right lower leg: No swelling.     Left lower leg: No swelling.     Comments: Left thumb swelling.  Skin:    General: Skin is warm.     Comments: Dry skin on buttock.  Neurological:     Mental Status: She is alert.     Data Reviewed: Creatinine 0.91, hemoglobin 9.0, platelet count 372, white blood cell count 5.7   Disposition: Status is: Inpatient Remains inpatient appropriate because: We do not have a disposition plan yet.  Planned Discharge Destination: Rehab    Time spent:27 minutes  Author: Verla Glaze, MD 12/26/2023 1:03 PM  For on call review [email or web address].

## 2023-12-27 DIAGNOSIS — I9589 Other hypotension: Secondary | ICD-10-CM | POA: Diagnosis not present

## 2023-12-27 DIAGNOSIS — E43 Unspecified severe protein-calorie malnutrition: Secondary | ICD-10-CM | POA: Diagnosis not present

## 2023-12-27 DIAGNOSIS — J189 Pneumonia, unspecified organism: Secondary | ICD-10-CM | POA: Diagnosis not present

## 2023-12-27 DIAGNOSIS — G9341 Metabolic encephalopathy: Secondary | ICD-10-CM | POA: Diagnosis not present

## 2023-12-27 DIAGNOSIS — B20 Human immunodeficiency virus [HIV] disease: Secondary | ICD-10-CM | POA: Diagnosis not present

## 2023-12-27 LAB — CBC WITH DIFFERENTIAL/PLATELET
Abs Immature Granulocytes: 0.02 10*3/uL (ref 0.00–0.07)
Basophils Absolute: 0 10*3/uL (ref 0.0–0.1)
Basophils Relative: 1 %
Eosinophils Absolute: 0.8 10*3/uL — ABNORMAL HIGH (ref 0.0–0.5)
Eosinophils Relative: 16 %
HCT: 28.3 % — ABNORMAL LOW (ref 36.0–46.0)
Hemoglobin: 8.7 g/dL — ABNORMAL LOW (ref 12.0–15.0)
Immature Granulocytes: 0 %
Lymphocytes Relative: 25 %
Lymphs Abs: 1.3 10*3/uL (ref 0.7–4.0)
MCH: 26.9 pg (ref 26.0–34.0)
MCHC: 30.7 g/dL (ref 30.0–36.0)
MCV: 87.6 fL (ref 80.0–100.0)
Monocytes Absolute: 0.5 10*3/uL (ref 0.1–1.0)
Monocytes Relative: 10 %
Neutro Abs: 2.4 10*3/uL (ref 1.7–7.7)
Neutrophils Relative %: 48 %
Platelets: 333 10*3/uL (ref 150–400)
RBC: 3.23 MIL/uL — ABNORMAL LOW (ref 3.87–5.11)
RDW: 14.9 % (ref 11.5–15.5)
Smear Review: NORMAL
WBC: 5 10*3/uL (ref 4.0–10.5)
nRBC: 0 % (ref 0.0–0.2)

## 2023-12-27 LAB — PHOSPHORUS: Phosphorus: 4.3 mg/dL (ref 2.5–4.6)

## 2023-12-27 LAB — COMPREHENSIVE METABOLIC PANEL
ALT: 26 U/L (ref 0–44)
AST: 26 U/L (ref 15–41)
Albumin: 3.1 g/dL — ABNORMAL LOW (ref 3.5–5.0)
Alkaline Phosphatase: 61 U/L (ref 38–126)
Anion gap: 9 (ref 5–15)
BUN: 37 mg/dL — ABNORMAL HIGH (ref 6–20)
CO2: 25 mmol/L (ref 22–32)
Calcium: 9.2 mg/dL (ref 8.9–10.3)
Chloride: 106 mmol/L (ref 98–111)
Creatinine, Ser: 0.8 mg/dL (ref 0.44–1.00)
GFR, Estimated: >60 mL/min (ref >60)
Glucose, Bld: 96 mg/dL (ref 70–99)
Potassium: 4 mmol/L (ref 3.5–5.1)
Sodium: 140 mmol/L (ref 135–145)
Total Bilirubin: 0.3 mg/dL (ref 0.0–1.2)
Total Protein: 7.9 g/dL (ref 6.5–8.1)

## 2023-12-27 LAB — MAGNESIUM: Magnesium: 2.2 mg/dL (ref 1.7–2.4)

## 2023-12-27 NOTE — Progress Notes (Signed)
Physical Therapy Treatment Patient Details Name: Meghan Jarvis MRN: [id number] DOB: 06-21-1976 Today's Date: 12/27/2023   History of Present Illness Pt is a 48 year old female admitted with [facility] with acute hypoxic respiratory failure, severe sepsis, community acquired PNA acute metabolic encephalopathy; She was intubated, extubated 12/30    PMH significant for alcohol abuse, cocaine abuse, homelessness, AIDS, atrial fibrillation, hypothyroidism, type 2 diabetes mellitus    PT Comments  Pt pleasant but impulsive t/o session. She was able to transfer to [facility] with CGA but needed frequent cuing to insure safety and positional awareness.  She refused exercises and getting the recliner when offered but was willing to do a few small loops in the room with walker - cuing t/o for appropriate use and consistency however poor awareness t/o was a limiter.  Pt will benefit from further PT, continue with POC.     If plan is discharge home, recommend the following: Assistance with cooking/housework;Direct supervision/assist for financial management;Direct supervision/assist for medications management;Assist for transportation;Help with stairs or ramp for entrance;Supervision due to cognitive status;A little help with walking and/or transfers;A little help with bathing/dressing/bathroom   Can travel by private vehicle     Yes  Equipment Recommendations  Rolling walker (2 wheels)    Recommendations for Other Services       Precautions / Restrictions Precautions Precautions: Fall Restrictions Weight Bearing Restrictions Per Provider Order: No     Mobility  Bed Mobility Overal bed mobility: Needs Assistance Bed Mobility: Sit to Supine Rolling: Contact guard assist, Min assist   Supine to sit: Min assist Sit to supine: Contact guard assist   General bed mobility comments: Pt with legs partially hanging off bed on arrival, never-the-less she needed light assist to fully get up to sitting, able to  use momentum to get LEs back up into bed    Transfers Overall transfer level: Needs assistance Equipment used: Rolling walker (2 wheels) Transfers: Sit to/from Stand Sit to Stand: Contact guard assist Stand pivot transfers: Min assist         General transfer comment: Pt again somewhat impulsive with transition to standing initially using that walker then letting go to grab her mess underwear and impulsively start staggering toward the [facility] stating "I gotta go, I gotta go"   Pt was able to arrest her unsafe movement and get BSC appropriately positioned fot transfer    Ambulation/Gait Ambulation/Gait assistance: Contact guard assist Gait Distance (Feet): 90 Feet Assistive device: Rolling walker (2 wheels), 1 person hand held assist         General Gait Details: Pt with relatively safe in room ambulation effort with walker. some impulsivity with turns and letting go of walker, but she did not need direct physical intervention to maintain balance.   Stairs             Wheelchair Mobility     Tilt Bed    Modified Rankin (Stroke Patients Only)       Balance Overall balance assessment: Needs assistance Sitting-balance support: No upper extremity supported, Feet supported Sitting balance-Leahy Scale: Good     Standing balance support: Bilateral upper extremity supported Standing balance-Leahy Scale: Poor Standing balance comment: Pt did at times let go of walker and maintain balance but did not show good insight/awareness and ultimately needed close CGA t/o the session.                            Cognition  Arousal: Alert Behavior During Therapy: Impulsive                                   General Comments: Pt was able to interact relatively appropriately but showed poor general insight (safety, fluidity of movement, etc)        Exercises      General Comments        Pertinent Vitals/Pain Pain Assessment Pain Assessment:  0-10 Pain Location: variously t/o session c/o vague: back, leg, head, shoulder and stomach pain    Home Living                          Prior Function            PT Goals (current goals can now be found in the care plan section) Progress towards PT goals: Progressing toward goals    Frequency    Min 1X/week      PT Plan      Co-evaluation              AM-PAC PT "6 Clicks" Mobility   Outcome Measure  Help needed turning from your back to your side while in a flat bed without using bedrails?: None Help needed moving from lying on your back to sitting on the side of a flat bed without using bedrails?: A Little Help needed moving to and from a bed to a chair (including a wheelchair)?: A Little Help needed standing up from a chair using your arms (e.g., wheelchair or bedside chair)?: A Little Help needed to walk in hospital room?: A Little Help needed climbing 3-5 steps with a railing? : A Lot 6 Click Score: 18    End of Session Equipment Utilized During Treatment: Gait belt Activity Tolerance: Patient tolerated treatment well Patient left: in bed;with call bell/phone within reach;with bed alarm set   PT Visit Diagnosis: Other abnormalities of gait and mobility (R26.89);Difficulty in walking, not elsewhere classified (R26.2);Muscle weakness (generalized) (M62.81)     Time: 1914-7829 PT Time Calculation (min) (ACUTE ONLY): 13 min  Charges:    $Gait Training: 8-22 mins PT General Charges $$ ACUTE PT VISIT: 1 Visit                     Malachi Pro, DPT 12/27/2023, 4:46 PM

## 2023-12-27 NOTE — Progress Notes (Signed)
Date of Admission:  12/08/2023     ID: Meghan Jarvis is a 48 y.o. female  Principal Problem:   Acute metabolic encephalopathy Active Problems:   Alcohol abuse   Acute hypoxic respiratory failure (HCC)   Pneumonia of right lower lobe due to infectious organism   Protein-calorie malnutrition, severe   Uncontrolled type 2 diabetes mellitus with hypoglycemia, without long-term current use of insulin (HCC)   Cocaine abuse (HCC)   AIDS (HCC)   Severe sepsis (HCC)   Chest pain   Pancytopenia (HCC)   Generalized weakness   Diarrhea   Right upper quadrant abdominal pain   Headache   Underweight (BMI < 18.5)   Hypotension   Abdominal pain   Encounter for psychiatric assessment   Rash due to allergy   Right knee pain   AKI (acute kidney injury) (HCC)  ? Meghan Jarvis is a 48 y.o. with a history of AIDS, not compliant with meds or visits ,  , cocaine use was brought in by EMS after being found unresponsive on the couch  in an aquaintance place-   Subjective: She is doing okay   Medications:   atovaquone  1,500 mg Oral Q breakfast   azithromycin  1,200 mg Oral Weekly   bictegravir-emtricitabine-tenofovir AF  1 tablet Oral Daily   DermaCerin   Topical BID   DULoxetine  30 mg Oral QHS   enoxaparin (LOVENOX) injection  40 mg Subcutaneous QHS   feeding supplement  237 mL Oral TID BM   folic acid  1 mg Oral Daily   lactulose  10 g Oral TID   midodrine  5 mg Oral TID WC   multivitamin with minerals  1 tablet Oral QHS   pantoprazole  40 mg Oral BID   polyethylene glycol  17 g Oral Daily   thiamine  100 mg Oral Daily    Objective: Vital signs in last 24 hours: Patient Vitals for the past 24 hrs:  BP Temp Temp src Pulse Resp SpO2 Weight  12/27/23 0837 95/69 98.7 F (37.1 C) Oral 92 20 100 % --  12/27/23 0542 99/64 97.6 F (36.4 C) -- 96 16 100 % --  12/27/23 0500 -- -- -- -- -- -- 55.7 kg  12/26/23 2152 97/69 98.7 F (37.1 C) -- 92 16 100 % --    Awake and  alert emaciated Chest b/l air entry [id number] Skin- erythematous rash over buttocks and thighs and legs- excoriations papular eruption CNS grossly non focal   Lab Results    Latest Ref Rng & Units 12/27/2023    6:11 AM 12/26/2023    6:11 AM 12/25/2023    7:01 AM  CBC  WBC 4.0 - 10.5 K/uL 5.0  5.7  5.3   Hemoglobin 12.0 - 15.0 g/dL 8.7  9.0  8.8   Hematocrit 36.0 - 46.0 % 28.3  29.7  28.5   Platelets 150 - 400 K/uL 333  372  308        Latest Ref Rng & Units 12/27/2023    6:11 AM 12/26/2023    6:11 AM 12/25/2023    7:01 AM  CMP  Glucose 70 - 99 mg/dL 96  161  89   BUN 6 - 20 mg/dL 37  34  34   Creatinine 0.44 - 1.00 mg/dL 0.96  0.45  4.09   Sodium 135 - 145 mmol/L 140  141  142   Potassium 3.5 - 5.1 mmol/L 4.0  3.9  4.5  Chloride 98 - 111 mmol/L 106  105  106   CO2 22 - 32 mmol/L 25  27  27    Calcium 8.9 - 10.3 mg/dL 9.2  9.5  9.5   Total Protein 6.5 - 8.1 g/dL 7.9  8.4  8.0   Total Bilirubin 0.0 - 1.2 mg/dL 0.3  0.4  0.2   Alkaline Phos 38 - 126 U/L 61  67  64   AST 15 - 41 U/L 26  24  32   ALT 0 - 44 U/L 26  25  28        Microbiology: 12/28 BC NG  Toxo IgG > 400 Toxo PCR neg Toxo igM neg RPR NR CMV DNA neg Crypto neg HIV RNA 2 million Cd4 is 14 ( 2.4%) Beta D glucan > 500 ( zosyn could have increased it- will repeat) Histoplasma neg Fungal antibodies pending Genosure prime- no resistant mutations     Assessment/Plan: Unresponsive /AMS secondary to cocaine use- resolved   Aspiration leading to acute hypoxic resp failure rt Middle lobe and lower lobe consolidation-- was intubated and mechanically ventilated in the beginning. Now extubated Was On zosyn Completed rx Repeat CXR clear   Rt sided abdominl flank, chest pain- CT abd ok- MRI spine deg changes- non acute  Rash- likely due to bactrim- DC and start mepron   H/o fall with recurrent ED presentation- had hurt left shoulder before   Left thumb tender swelling- - resolved   Pruritus with clear  blisters- resolved- no new one D.D  Cocaine use Insect bite Scabies- no burrows R/o primary dermatological condition like pemphigus ( less likely)  Syphilis- RPR neg   AIDS- non compliant with meds or visits to her Provider- not been in care since 2021- used to be followed at [facility] before Was on Biktarvy at one time and was non compliant then VL now is 2 million and cd4 is 11  Started Biktarvy on 12/14/23 . not sure how much compliance to be expected as her social situation is not conducive and so is her substance use- watch closely for Immune reconstitution inflammatory syndrome Genotype no resistance to NRTI, NNRTI, Integrase inhibitor and PI   Toxo IgG very high-  MRI brain no CNS lesions  . TOXO PCR negative On  PCP and MAI prophylaxis. 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

## 2023-12-27 NOTE — Progress Notes (Signed)
Progress Note   Patient: Meghan Jarvis NWG:[id number] DOB: Sep 12, 1976 DOA: 12/08/2023     19 DOS: the patient was seen and examined on 12/27/2023   Brief hospital course: 48 year old female past medical history of alcohol abuse, cocaine abuse, homelessness, AIDS, atrial fibrillation, hypothyroidism, type 2 diabetes mellitus presented to [facility] with acute respiratory failure.  She was found down by a friend and could not wake her up.  She was found to be hypoxic in the 70s.  Since she was obtunded she was intubated and admitted by the critical care service.  He was started on antibiotics for pneumonia.  Urine toxicology positive for cocaine.  Patient extubated on 12/30.  12/12/2023.  Patient feeling okay.  Spoke with patient's friend [name] on the phone.  The patient is dating her son. 1/2.  Patient complained of chest pain.  Cardiac enzymes negative x 2 and EKG did not show any ST elevation. 1/3.  Patient complaining of abdominal pain.  Will get right upper quadrant ultrasound negative.  MRI of the brain did not show any toxoplasmosis lesions but did show advanced cerebral atrophy. 1/4.  Patient complaining of headache.  Will give IV magnesium.  Patient walked 3 feet with physical therapy. 1/5.  Blood pressure this morning was low and 2 fluid boluses given.  Started on midodrine. 1/6.  Patient complaining of a lot of abdominal pain.  CT scan does not show any acute process and shows better aeration of right lower lung.  CT did show constipation.  Will start MiraLAX. 1/7.  Patient feeling good today.  Did not complain of any pain.  Hemoglobin 9.1, platelet count up at 311, white blood count 4.1  1/15.  We do not have a TOC plan at this point of time.  TOC reached out to [facility].  Patient complains of right knee pain.  Uric acid 4.4.  X-ray negative. 1/16.  Patient feeling good and offers no complaints today.  Assessment and Plan: * Acute metabolic encephalopathy Mental status improved  from admission but patient is not the best historian and does not have the best capacity to make decisions at this time.  [facility] referral by TOC.  MRI did not show any enhancing lesions but did show severe cerebral atrophy.  Could be HIV associated neurocognitive disorder.  Hypotension Continue 5 mg of midodrine.  AIDS (HCC) Viral load 2 million, CD4 14.  Infectious disease following.  Placed on PCP and MAI prophylaxis with atovaquone and Zithromax.  Toxoplasma antibody IgG came back elevated and MRI of the brain does not show any enhancing lesions.  Infectious disease specialist started on Biktarvy.  Headache Resolved.  IV magnesium earlier in hospital course.  Cryptococcus antigen negative.  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  Platelet platelet count up in the normal range at 333, white blood cell count up in the normal range at 5.0, hemoglobin 8.7.  Protein-calorie malnutrition, severe Continue supplements  Severe sepsis (HCC) Present on admission with tachycardia, tachypnea, fever, pneumonia, acute respiratory failure and acute metabolic encephalopathy.  Patient completed antibiotics.  Cocaine abuse (HCC) Must stop cocaine  Pneumonia of right lower lobe due to infectious organism Completed 5 days of antibiotic.  Acute hypoxic respiratory failure (HCC) Resolved.  Patient extubated on 12/30.  Patient currently breathing on room air.  Alcohol abuse Continue thiamine multivitamin and folic acid  Abdominal pain Resolved.  Likely related to constipation.  Continue MiraLAX.  Earlier in the hospital course had right upper quadrant pain with an ultrasound of  the right upper quadrant was negative.  CT scan showing constipation.  Uncontrolled type 2 diabetes mellitus with hypoglycemia, without long-term current use of insulin (HCC) Hemoglobin A1c 6.0.   Generalized weakness Physical therapy recommending rehab  Chest pain Atypical in nature.  Troponin x 2 negative.  EKG did not show  any ST elevations.  AKI (acute kidney injury) (HCC) Creatinine went up to 1.38 on 1/13.  Creatinine down to 0.8 on 1/16.  Right knee pain No pain today.  Right knee x-ray negative.  Uric acid 4.4.  Continue to monitor.  Underweight (BMI < 18.5) Last BMI 18.68  Diarrhea Likely overflow constipation causing her diarrhea.        Subjective: Patient feels good today.  Wants to get out of the hospital.  Asking questions about her memory and she keeps the flank pain.  Stated Danae Orleans was the president.  Physical Exam: Vitals:   12/26/23 2152 12/27/23 0500 12/27/23 0542 12/27/23 0837  BP: 97/69  99/64 95/69  Pulse: 92  96 92  Resp: 16  16 20   Temp: 98.7 F (37.1 C)  97.6 F (36.4 C) 98.7 F (37.1 C)  TempSrc:    Oral  SpO2: 100%  100% 100%  Weight:  55.7 kg    Height:       Physical Exam HENT:     Head: Normocephalic.  Eyes:     General: Lids are normal.     Conjunctiva/sclera: Conjunctivae normal.  Cardiovascular:     Rate and Rhythm: Normal rate and regular rhythm.     Heart sounds: Normal heart sounds, S1 normal and S2 normal.  Pulmonary:     Breath sounds: Examination of the right-lower field reveals decreased breath sounds. Examination of the left-lower field reveals decreased breath sounds. Decreased breath sounds present. No wheezing, rhonchi or rales.  Abdominal:     Palpations: Abdomen is soft.     Tenderness: There is no abdominal tenderness.  Musculoskeletal:     Right lower leg: No swelling.     Left lower leg: No swelling.     Comments: Left thumb swelling.  Skin:    General: Skin is warm.     Comments: Dry skin on buttock.  Neurological:     Mental Status: She is alert.     Data Reviewed: Creatinine 0.8, white blood cell count 5.0, hemoglobin 8.7, platelet count 333  Family Communication: Left message for friend  Disposition: Status is: Inpatient Remains inpatient appropriate because: DSS is involved  Planned Discharge Destination: Physical  therapy recommending rehab    Time spent: 27 minutes  Author: Alford Highland, MD 12/27/2023 12:21 PM  For on call review [email or web address].

## 2023-12-27 NOTE — Plan of Care (Signed)
  Problem: Education: Goal: Ability to describe self-care measures that may prevent or decrease complications (Diabetes Survival Skills Education) will improve Outcome: Progressing Goal: Individualized Educational Video(s) Outcome: Progressing   Problem: Coping: Goal: Ability to adjust to condition or change in health will improve Outcome: Progressing   Problem: Fluid Volume: Goal: Ability to maintain a balanced intake and output will improve Outcome: Progressing   Problem: Health Behavior/Discharge Planning: Goal: Ability to identify and utilize available resources and services will improve Outcome: Progressing Goal: Ability to manage health-related needs will improve Outcome: Progressing   Problem: Metabolic: Goal: Ability to maintain appropriate glucose levels will improve Outcome: Progressing   Problem: Nutritional: Goal: Maintenance of adequate nutrition will improve Outcome: Progressing Goal: Progress toward achieving an optimal weight will improve Outcome: Progressing   Problem: Skin Integrity: Goal: Risk for impaired skin integrity will decrease Outcome: Progressing   Problem: Tissue Perfusion: Goal: Adequacy of tissue perfusion will improve Outcome: Progressing   Problem: Education: Goal: Knowledge of General Education information will improve Description: Including pain rating scale, medication(s)/side effects and non-pharmacologic comfort measures Outcome: Progressing   Problem: Health Behavior/Discharge Planning: Goal: Ability to manage health-related needs will improve Outcome: Progressing   Problem: Clinical Measurements: Goal: Ability to maintain clinical measurements within normal limits will improve Outcome: Progressing Goal: Will remain free from infection Outcome: Progressing Goal: Diagnostic test results will improve Outcome: Progressing Goal: Respiratory complications will improve Outcome: Progressing Goal: Cardiovascular complication will  be avoided Outcome: Progressing   Problem: Activity: Goal: Risk for activity intolerance will decrease Outcome: Progressing   Problem: Nutrition: Goal: Adequate nutrition will be maintained Outcome: Progressing   Problem: Coping: Goal: Level of anxiety will decrease Outcome: Progressing   Problem: Elimination: Goal: Will not experience complications related to bowel motility Outcome: Progressing Goal: Will not experience complications related to urinary retention Outcome: Progressing   Problem: Pain Management: Goal: General experience of comfort will improve Outcome: Progressing   Problem: Safety: Goal: Ability to remain free from injury will improve Outcome: Progressing   Problem: Skin Integrity: Goal: Risk for impaired skin integrity will decrease Outcome: Progressing   Problem: Activity: Goal: Ability to tolerate increased activity will improve Outcome: Progressing   Problem: Respiratory: Goal: Ability to maintain a clear airway and adequate ventilation will improve Outcome: Progressing

## 2023-12-27 NOTE — Progress Notes (Signed)
PIV not indicated as of this time, pt. Has no IV meds nor IVF.RN to place a consult  to IVTeam once indicated.

## 2023-12-27 NOTE — Progress Notes (Signed)
Provider notified via secure chat as follows:   FYI - pt has no IV access, she pulled it out earlier in the shift. She has pulled out several Saline Lock's the past few days. IV team was notified but since does not have anything urgent order via IV, they are not going to come replace it.

## 2023-12-28 DIAGNOSIS — E43 Unspecified severe protein-calorie malnutrition: Secondary | ICD-10-CM | POA: Diagnosis not present

## 2023-12-28 DIAGNOSIS — R1084 Generalized abdominal pain: Secondary | ICD-10-CM | POA: Diagnosis not present

## 2023-12-28 DIAGNOSIS — J9601 Acute respiratory failure with hypoxia: Secondary | ICD-10-CM | POA: Diagnosis not present

## 2023-12-28 DIAGNOSIS — G9341 Metabolic encephalopathy: Secondary | ICD-10-CM | POA: Diagnosis not present

## 2023-12-28 DIAGNOSIS — B20 Human immunodeficiency virus [HIV] disease: Secondary | ICD-10-CM | POA: Diagnosis not present

## 2023-12-28 DIAGNOSIS — F1410 Cocaine abuse, uncomplicated: Secondary | ICD-10-CM | POA: Diagnosis not present

## 2023-12-28 DIAGNOSIS — R079 Chest pain, unspecified: Secondary | ICD-10-CM | POA: Diagnosis not present

## 2023-12-28 DIAGNOSIS — J189 Pneumonia, unspecified organism: Secondary | ICD-10-CM | POA: Diagnosis not present

## 2023-12-28 DIAGNOSIS — I4891 Unspecified atrial fibrillation: Secondary | ICD-10-CM | POA: Diagnosis not present

## 2023-12-28 DIAGNOSIS — I9589 Other hypotension: Secondary | ICD-10-CM | POA: Diagnosis not present

## 2023-12-28 LAB — FUNGITELL BETA-D-GLUCAN
Fungitell Value:: >500 pg/mL
Result Name:: POSITIVE — AB

## 2023-12-28 LAB — VITAMIN B12: Vitamin B-12: 303 pg/mL (ref 180–914)

## 2023-12-28 MED ORDER — POLYETHYLENE GLYCOL 3350 17 G PO PACK
17.0000 g | PACK | Freq: Every day | ORAL | Status: DC | PRN
  Administered 2024-01-05 – 2024-01-28 (×5): 17 g via ORAL
  Filled 2023-12-28 (×7): qty 1

## 2023-12-28 MED ORDER — KETOROLAC TROMETHAMINE 15 MG/ML IJ SOLN
15.0000 mg | Freq: Three times a day (TID) | INTRAMUSCULAR | Status: DC | PRN
  Filled 2023-12-28: qty 1

## 2023-12-28 MED ORDER — OXYCODONE HCL 5 MG PO TABS
5.0000 mg | ORAL_TABLET | ORAL | Status: DC | PRN
  Administered 2023-12-28 – 2023-12-29 (×2): 5 mg via ORAL
  Filled 2023-12-28 (×2): qty 1

## 2023-12-28 NOTE — TOC Progression Note (Signed)
Transition of Care [facility]) - Progression Note    Patient Details  Name: Meghan Jarvis MRN: [id number] Date of Birth: [birth year]  Transition of [facility] CM/SW Contact  Allena Katz, LCSW Phone Number: 12/28/2023, 3:51 PM  Clinical Narrative:   Messaged DSS again for an update. Waiting to hear from [name] on who she was assigned as staff have noted they have seen someone from DSS come.          Expected Discharge Plan and Services                                               Social Determinants of Health (SDOH) Interventions SDOH Screenings   Food Insecurity: Patient Unable To Answer (12/09/2023)  Recent Concern: Food Insecurity - Food Insecurity Present (09/26/2023)  Housing: Patient Unable To Answer (12/09/2023)  Recent Concern: Housing - Medium Risk (09/26/2023)  Transportation Needs: Patient Unable To Answer (12/09/2023)  Recent Concern: Transportation Needs - Unmet Transportation Needs (10/11/2023)  Utilities: Patient Unable To Answer (12/09/2023)  Recent Concern: Utilities - At Risk (09/25/2023)  Tobacco Use: High Risk (12/08/2023)    Readmission Risk Interventions     No data to display

## 2023-12-28 NOTE — Progress Notes (Signed)
Occupational Therapy Treatment Patient Details Name: Meghan Jarvis MRN: [id number] DOB: [birth year] Today's Date: 12/28/2023   History of present illness Pt is a 48 year old female admitted with [facility] with acute hypoxic respiratory failure, severe sepsis, community acquired PNA acute metabolic encephalopathy; She was intubated, extubated 12/30    PMH significant for alcohol abuse, cocaine abuse, homelessness, AIDS, atrial fibrillation, hypothyroidism, type 2 diabetes mellitus   OT comments  Pt seen for OT tx. Pt received laying perpendicular in the bed, fetal position, with blankets covering her body and head. Pt indicates she is having 10/10 headache, abdominal pain, and chest pain. MAX A to reposition in bed to improve comfort and safety, able to follow commands well. Pt unable to tolerate any additional mobility/ADL. RN and MD promptly notified. Session limited this date by pain, resulting in increased assist required for mobility. Will continue to progress as able.       If plan is discharge home, recommend the following:  A lot of help with walking and/or transfers;A lot of help with bathing/dressing/bathroom;Assistance with cooking/housework;Assistance with feeding;Direct supervision/assist for medications management;Direct supervision/assist for financial management;Assist for transportation;Help with stairs or ramp for entrance;Supervision due to cognitive status   Equipment Recommendations  Other (comment) (defer)    Recommendations for Other Services      Precautions / Restrictions Precautions Precautions: Fall Restrictions Weight Bearing Restrictions Per Provider Order: No       Mobility Bed Mobility Overal bed mobility: Needs Assistance             General bed mobility comments: MAX A for repositioning in bed to improve comfort, too pain limited    Transfers                         Balance                                            ADL either performed or assessed with clinical judgement   ADL                                              Extremity/Trunk Assessment              Vision       Perception     Praxis      Cognition Arousal: Alert Behavior During Therapy: WFL for tasks assessed/performed Overall Cognitive Status: No family/caregiver present to determine baseline cognitive functioning                                 General Comments: pt expressing significant pain, able to follow simple commands to help reposition        Exercises      Shoulder Instructions       General Comments      Pertinent Vitals/ Pain       Pain Assessment Pain Assessment: 0-10 Pain Score: 10-Worst pain ever Pain Location: headache, abdominal pain/cramping, and chest pain - RN and MD notified promptly Pain Descriptors / Indicators: Grimacing, Guarding, Discomfort, Moaning, Headache Pain Intervention(s): Limited activity within patient's tolerance, Monitored during session, Repositioned, Patient requesting pain meds-RN notified  Home Living  Prior Functioning/Environment              Frequency  Min 1X/week        Progress Toward Goals  OT Goals(current goals can now be found in the care plan section)  Progress towards OT goals: OT to reassess next treatment  Acute Rehab OT Goals Patient Stated Goal: get better OT Goal Formulation: With patient Time For Goal Achievement: 01/07/24 Potential to Achieve Goals: Fair  Plan      Co-evaluation                 AM-PAC OT "6 Clicks" Daily Activity     Outcome Measure   Help from another person eating meals?: None Help from another person taking care of personal grooming?: A Little Help from another person toileting, which includes using toliet, bedpan, or urinal?: A Lot Help from another person bathing (including washing, rinsing, drying)?:  A Little Help from another person to put on and taking off regular upper body clothing?: A Little Help from another person to put on and taking off regular lower body clothing?: A Lot 6 Click Score: 17    End of Session    OT Visit Diagnosis: Other abnormalities of gait and mobility (R26.89);Muscle weakness (generalized) (M62.81)   Activity Tolerance Patient limited by pain   Patient Left in bed;with call bell/phone within reach;with bed alarm set   Nurse Communication Mobility status;Patient requests pain meds        Time: 8657-8469 OT Time Calculation (min): 8 min  Charges: OT General Charges $OT Visit: 1 Visit OT Treatments $Therapeutic Activity: 8-22 mins  Arman Filter., MPH, MS, OTR/L ascom [phone] 12/28/23, 3:33 PM

## 2023-12-28 NOTE — Progress Notes (Signed)
Contacted on call provider via secure chat as follows: This patient's orders for pain medicine were changed today. Tylenol for mild pain, Toradol for moderate pain, oxy PO for severe pain. I pulled toradol for her but I found her saline lock on the floor. She pulls one out every day. Can we administer it IM? She is rating her pain 9/10 but per observation she is probably more moderate than severe.   45 mins Meghan Schwartz, NP she is rating her pain as severe and you have to treat as severe  so give the oral oxy]  BM er pain level is what she says it is   PRN oxycodone administered. Remains without IV access at this time. Pt c/o pain to her knees, legs, abdomen, rt chest/rib cage area rated 9/10. Relaxed and watching TV, given an Ensure and crackers and peanut butter. Derma cream applied to legs and arms.

## 2023-12-28 NOTE — Progress Notes (Signed)
Responded earlier to patient having chest and abdominal pain.  Patient asking for pain medication.  EKG reviewed which shows normal sinus rhythm 90 bpm no ST-T wave changes.  Patient's pain more in the abdomen.  Patient been having bowel movements.  Had refused lactulose and MiraLAX.  Will continue to monitor.  Reviewed previous CAT scan and abdominal ultrasound which were negative.  Plan.  Continue to observe.  As needed pain medications.  Physical Exam Cardiovascular:     Rate and Rhythm: Normal rate and regular rhythm.     Heart sounds: Normal heart sounds, S1 normal and S2 normal.  Pulmonary:     Breath sounds: No decreased breath sounds, wheezing, rhonchi or rales.  Abdominal:     Palpations: Abdomen is soft.     Tenderness: There is generalized abdominal tenderness.     Dr. Alford Highland 20 minutes

## 2023-12-28 NOTE — Progress Notes (Signed)
Progress Note   Patient: Meghan Jarvis ONG:[id number] DOB: [birth year] DOA: 12/08/2023     20 DOS: the patient was seen and examined on 12/28/2023   Brief hospital course: 48 year old female past medical history of alcohol abuse, cocaine abuse, homelessness, AIDS, atrial fibrillation, hypothyroidism, type 2 diabetes mellitus presented to [facility] regional with acute respiratory failure.  She was found down by a friend and could not wake her up.  She was found to be hypoxic in the 70s.  Since she was obtunded she was intubated and admitted by the critical care service.  He was started on antibiotics for pneumonia.  Urine toxicology positive for cocaine.  Patient extubated on 12/30.  12/12/2023.  Patient feeling okay.  Spoke with patient's friend [name] on the phone.  The patient is dating her son. 1/2.  Patient complained of chest pain.  Cardiac enzymes negative x 2 and EKG did not show any ST elevation. 1/3.  Patient complaining of abdominal pain.  Will get right upper quadrant ultrasound negative.  MRI of the brain did not show any toxoplasmosis lesions but did show advanced cerebral atrophy. 1/4.  Patient complaining of headache.  Will give IV magnesium.  Patient walked 3 feet with physical therapy. 1/5.  Blood pressure this morning was low and 2 fluid boluses given.  Started on midodrine. 1/6.  Patient complaining of a lot of abdominal pain.  CT scan does not show any acute process and shows better aeration of right lower lung.  CT did show constipation.  Will start MiraLAX. 1/7.  Patient feeling good today.  Did not complain of any pain.  Hemoglobin 9.1, platelet count up at 311, white blood count 4.1  1/15.  We do not have a TOC plan at this point of time.  TOC reached out to [facility].  Patient complains of right knee pain.  Uric acid 4.4.  X-ray negative. 1/16.  Patient feeling good and offers no complaints today. 1/17.  Patient complains of a slight headache.  Assessment and Plan: * Acute  metabolic encephalopathy Mental status improved from admission but patient is not the best historian and does not have the best capacity to make decisions at this time.  [facility] referral by TOC.  MRI did not show any enhancing lesions but did show severe cerebral atrophy.  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  Hypotension Continue 5 mg of midodrine.  AIDS (HCC) Viral load 2 million, CD4 14.  Infectious disease following.  Placed on PCP and MAI prophylaxis with atovaquone and Zithromax.  Toxoplasma antibody IgG came back elevated and MRI of the brain does not show any enhancing lesions.  Infectious disease specialist started on Biktarvy.  Headache Tylenol as needed.  IV magnesium earlier in hospital course.  Cryptococcus antigen negative.  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  Platelet platelet count up in the normal range at 333, white blood cell count up in the normal range at 5.0, hemoglobin 8.7.  Protein-calorie malnutrition, severe Continue supplements  Severe sepsis (HCC) Present on admission with tachycardia, tachypnea, fever, pneumonia, acute respiratory failure and acute metabolic encephalopathy.  Patient completed antibiotics.  Cocaine abuse (HCC) Must stop cocaine  Pneumonia of right lower lobe due to infectious organism Completed 5 days of antibiotic.  Acute hypoxic respiratory failure (HCC) Resolved.  Patient extubated on 12/30.  Patient currently breathing on room air.  Alcohol abuse Continue thiamine multivitamin and folic acid  Abdominal pain Resolved.  Likely related to constipation. Earlier in the hospital course had right upper quadrant  pain with an ultrasound of the right upper quadrant was negative.  CT scan showing constipation.  Uncontrolled type 2 diabetes mellitus with hypoglycemia, without long-term current use of insulin (HCC) Hemoglobin A1c 6.0.   Generalized weakness Physical therapy recommending rehab  Chest pain Atypical in nature.   Troponin x 2 negative.  EKG did not show any ST elevations.  AKI (acute kidney injury) (HCC) Creatinine went up to 1.38 on 1/13.  Creatinine down to 0.8 on 1/16.  Right knee pain No pain today.  Right knee x-ray negative.  Uric acid 4.4.  Continue to monitor.  Underweight (BMI < 18.5) Last BMI 16.76  Diarrhea Likely overflow constipation causing her diarrhea.        Subjective: Patient feels well.  Complains of a slight headache.  Brought in with altered mental status admitted with acute respiratory failure aspiration pneumonia and sepsis.  Physical Exam: Vitals:   12/28/23 0400 12/28/23 0500 12/28/23 0837 12/28/23 0927  BP: 104/65  (!) 133/117 (!) 89/66  Pulse: 92  92   Resp:   16   Temp: 98.2 F (36.8 C)  98.1 F (36.7 C)   TempSrc:   Oral   SpO2: 100%  100%   Weight:  50 kg    Height:       Physical Exam HENT:     Head: Normocephalic.  Eyes:     General: Lids are normal.     Conjunctiva/sclera: Conjunctivae normal.  Cardiovascular:     Rate and Rhythm: Normal rate and regular rhythm.     Heart sounds: Normal heart sounds, S1 normal and S2 normal.  Pulmonary:     Breath sounds: Examination of the right-lower field reveals decreased breath sounds. Examination of the left-lower field reveals decreased breath sounds. Decreased breath sounds present. No wheezing, rhonchi or rales.  Abdominal:     Palpations: Abdomen is soft.     Tenderness: There is no abdominal tenderness.  Musculoskeletal:     Right lower leg: No swelling.     Left lower leg: No swelling.     Comments: Left thumb swelling.  Skin:    General: Skin is warm.  Neurological:     Mental Status: She is alert.     Data Reviewed: Vitamin B12 303  Disposition: Status is: Inpatient Remains inpatient appropriate because: DSS to help locate next place of living.  Planned Discharge Destination: To be determined    Time spent: 28 minutes  Author: Alford Highland, MD 12/28/2023 2:41 PM  For  on call review [email or web address].

## 2023-12-28 NOTE — Plan of Care (Signed)
  Problem: Education: Goal: Ability to describe self-care measures that may prevent or decrease complications (Diabetes Survival Skills Education) will improve Outcome: Not Applicable Goal: Individualized Educational Video(s) Outcome: Not Applicable   Problem: Coping: Goal: Ability to adjust to condition or change in health will improve Outcome: Progressing   Problem: Fluid Volume: Goal: Ability to maintain a balanced intake and output will improve Outcome: Progressing   Problem: Health Behavior/Discharge Planning: Goal: Ability to identify and utilize available resources and services will improve Outcome: Not Progressing Goal: Ability to manage health-related needs will improve Outcome: Not Progressing   Problem: Metabolic: Goal: Ability to maintain appropriate glucose levels will improve Outcome: Progressing   Problem: Nutritional: Goal: Maintenance of adequate nutrition will improve Outcome: Progressing Goal: Progress toward achieving an optimal weight will improve Outcome: Progressing   Problem: Skin Integrity: Goal: Risk for impaired skin integrity will decrease Outcome: Progressing   Problem: Tissue Perfusion: Goal: Adequacy of tissue perfusion will improve Outcome: Progressing   Problem: Education: Goal: Knowledge of General Education information will improve Description: Including pain rating scale, medication(s)/side effects and non-pharmacologic comfort measures Outcome: Not Progressing   Problem: Health Behavior/Discharge Planning: Goal: Ability to manage health-related needs will improve Outcome: Not Progressing   Problem: Clinical Measurements: Goal: Ability to maintain clinical measurements within normal limits will improve Outcome: Progressing Goal: Will remain free from infection Outcome: Progressing Goal: Diagnostic test results will improve Outcome: Progressing Goal: Respiratory complications will improve Outcome: Progressing Goal:  Cardiovascular complication will be avoided Outcome: Progressing   Problem: Activity: Goal: Risk for activity intolerance will decrease Outcome: Not Progressing   Problem: Nutrition: Goal: Adequate nutrition will be maintained Outcome: Progressing   Problem: Coping: Goal: Level of anxiety will decrease Outcome: Progressing   Problem: Elimination: Goal: Will not experience complications related to bowel motility Outcome: Progressing Goal: Will not experience complications related to urinary retention Outcome: Progressing   Problem: Pain Management: Goal: General experience of comfort will improve Outcome: Progressing   Problem: Safety: Goal: Ability to remain free from injury will improve Outcome: Progressing   Problem: Skin Integrity: Goal: Risk for impaired skin integrity will decrease Outcome: Progressing   Problem: Activity: Goal: Ability to tolerate increased activity will improve Outcome: Progressing   Problem: Respiratory: Goal: Ability to maintain a clear airway and adequate ventilation will improve Outcome: Progressing

## 2023-12-28 NOTE — Progress Notes (Signed)
Date of Admission:  12/08/2023     ID: Meghan Jarvis is a 48 y.o. female  Principal Problem:   Acute metabolic encephalopathy Active Problems:   Alcohol abuse   Acute hypoxic respiratory failure (HCC)   Pneumonia of right lower lobe due to infectious organism   Protein-calorie malnutrition, severe   Uncontrolled type 2 diabetes mellitus with hypoglycemia, without long-term current use of insulin (HCC)   Cocaine abuse (HCC)   AIDS (HCC)   Severe sepsis (HCC)   Chest pain   Pancytopenia (HCC)   Generalized weakness   Diarrhea   Right upper quadrant abdominal pain   Headache   Underweight (BMI < 18.5)   Hypotension   Abdominal pain   Encounter for psychiatric assessment   Rash due to allergy   Right knee pain   AKI (acute kidney injury) (HCC)  ? Meghan Jarvis is a 48 y.o. with a history of AIDS, not compliant with meds or visits ,  , cocaine use was brought in by EMS after being found unresponsive on the couch  in an aquaintance place-   Subjective: Pt is drinking ensure She wants graham crackers and I gave her that and peanut butter   Medications:   atovaquone  1,500 mg Oral Q breakfast   azithromycin  1,200 mg Oral Weekly   bictegravir-emtricitabine-tenofovir AF  1 tablet Oral Daily   DermaCerin   Topical BID   DULoxetine  30 mg Oral QHS   enoxaparin (LOVENOX) injection  40 mg Subcutaneous QHS   feeding supplement  237 mL Oral TID BM   folic acid  1 mg Oral Daily   midodrine  5 mg Oral TID WC   multivitamin with minerals  1 tablet Oral QHS   pantoprazole  40 mg Oral BID   thiamine  100 mg Oral Daily    Objective: Vital signs in last 24 hours: Patient Vitals for the past 24 hrs:  BP Temp Temp src Pulse Resp SpO2 Weight  12/28/23 0927 (!) 89/66 -- -- -- -- -- --  12/28/23 0837 (!) 133/117 98.1 F (36.7 C) Oral 92 16 100 % --  12/28/23 0500 -- -- -- -- -- -- 50 kg  12/28/23 0400 104/65 98.2 F (36.8 C) -- 92 -- 100 % --  12/27/23 2238 102/70 98.1 F  (36.7 C) -- 99 -- 100 % --  12/27/23 2018 100/75 98.7 F (37.1 C) -- (!) 107 -- 100 % --  12/27/23 1711 100/67 98.5 F (36.9 C) Oral 96 20 99 % --    Awake and alert emaciated Chest b/l air entry Decreased bases [id number] Skin- erythematous rash over buttocks and thighs and legs- excoriations papular eruption CNS grossly non focal   Lab Results    Latest Ref Rng & Units 12/27/2023    6:11 AM 12/26/2023    6:11 AM 12/25/2023    7:01 AM  CBC  WBC 4.0 - 10.5 K/uL 5.0  5.7  5.3   Hemoglobin 12.0 - 15.0 g/dL 8.7  9.0  8.8   Hematocrit 36.0 - 46.0 % 28.3  29.7  28.5   Platelets 150 - 400 K/uL 333  372  308        Latest Ref Rng & Units 12/27/2023    6:11 AM 12/26/2023    6:11 AM 12/25/2023    7:01 AM  CMP  Glucose 70 - 99 mg/dL 96  403  89   BUN 6 - 20 mg/dL 37  34  34   Creatinine 0.44 - 1.00 mg/dL 1.61  0.96  0.45   Sodium 135 - 145 mmol/L 140  141  142   Potassium 3.5 - 5.1 mmol/L 4.0  3.9  4.5   Chloride 98 - 111 mmol/L 106  105  106   CO2 22 - 32 mmol/L 25  27  27    Calcium 8.9 - 10.3 mg/dL 9.2  9.5  9.5   Total Protein 6.5 - 8.1 g/dL 7.9  8.4  8.0   Total Bilirubin 0.0 - 1.2 mg/dL 0.3  0.4  0.2   Alkaline Phos 38 - 126 U/L 61  67  64   AST 15 - 41 U/L 26  24  32   ALT 0 - 44 U/L 26  25  28        Microbiology: 12/28 BC NG  Toxo IgG > 400 Toxo PCR neg Toxo igM neg RPR NR CMV DNA neg Crypto neg HIV RNA 2 million Cd4 is 14 ( 2.4%) Beta D glucan > 500 ( zosyn could have increased it- will repeat) Histoplasma neg Fungal antibodies pending Genosure prime- no resistant mutations     Assessment/Plan: Unresponsive /AMS secondary to cocaine use- resolved   Aspiration leading to acute hypoxic resp failure rt Middle lobe and lower lobe consolidation-- was intubated and mechanically ventilated in the beginning. Now extubated Was On zosyn Completed rx Repeat CXR clear   Rt sided abdominl flank, chest pain- CT abd ok- MRI spine deg changes- non acute  Rash- likely  due to bactrim- changed to Atovaquone- Mepron   H/o fall with recurrent ED presentation- had hurt left shoulder before   Left thumb tender swelling- - resolved   Pruritus with clear blisters- resolved- no new one D.D  Cocaine use Insect bite Scabies- no burrows R/o primary dermatological condition like pemphigus ( less likely)  Syphilis- RPR neg   AIDS- non compliant with meds or visits to her Provider- not been in care since 2021- used to be [facility] before Was on Biktarvy at one time and was non compliant then VL now is 2 million and cd4 is 11  Started Biktarvy on 12/14/23 . not sure how much compliance to be expected as her social situation is not conducive and so is her substance use- watch closely for Immune reconstitution inflammatory syndrome Genotype no resistance to NRTI, NNRTI, Integrase inhibitor and PI Will check VL next week   Toxo IgG very high-  MRI brain no CNS lesions  . TOXO PCR negative On  PCP and MAI prophylaxis. 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

## 2023-12-29 DIAGNOSIS — I9589 Other hypotension: Secondary | ICD-10-CM | POA: Diagnosis not present

## 2023-12-29 DIAGNOSIS — R1011 Right upper quadrant pain: Secondary | ICD-10-CM | POA: Diagnosis not present

## 2023-12-29 DIAGNOSIS — B20 Human immunodeficiency virus [HIV] disease: Secondary | ICD-10-CM | POA: Diagnosis not present

## 2023-12-29 DIAGNOSIS — G9341 Metabolic encephalopathy: Secondary | ICD-10-CM | POA: Diagnosis not present

## 2023-12-29 MED ORDER — OXYCODONE HCL 5 MG PO TABS
2.5000 mg | ORAL_TABLET | Freq: Four times a day (QID) | ORAL | Status: DC | PRN
  Administered 2023-12-29 – 2024-01-01 (×8): 2.5 mg via ORAL
  Filled 2023-12-29 (×9): qty 1

## 2023-12-29 MED ORDER — ACETAMINOPHEN 325 MG PO TABS
650.0000 mg | ORAL_TABLET | Freq: Four times a day (QID) | ORAL | Status: DC | PRN
  Administered 2023-12-29 – 2024-03-07 (×80): 650 mg via ORAL
  Filled 2023-12-29 (×85): qty 2

## 2023-12-29 NOTE — Plan of Care (Signed)
  Problem: Coping: Goal: Ability to adjust to condition or change in health will improve Outcome: Progressing   Problem: Fluid Volume: Goal: Ability to maintain a balanced intake and output will improve Outcome: Progressing   Problem: Health Behavior/Discharge Planning: Goal: Ability to identify and utilize available resources and services will improve Outcome: Progressing Goal: Ability to manage health-related needs will improve Outcome: Progressing   Problem: Metabolic: Goal: Ability to maintain appropriate glucose levels will improve Outcome: Progressing   Problem: Nutritional: Goal: Maintenance of adequate nutrition will improve Outcome: Progressing Goal: Progress toward achieving an optimal weight will improve Outcome: Progressing   Problem: Skin Integrity: Goal: Risk for impaired skin integrity will decrease Outcome: Progressing   Problem: Tissue Perfusion: Goal: Adequacy of tissue perfusion will improve Outcome: Progressing   Problem: Education: Goal: Knowledge of General Education information will improve Description: Including pain rating scale, medication(s)/side effects and non-pharmacologic comfort measures Outcome: Progressing   Problem: Health Behavior/Discharge Planning: Goal: Ability to manage health-related needs will improve Outcome: Progressing   Problem: Clinical Measurements: Goal: Ability to maintain clinical measurements within normal limits will improve Outcome: Progressing Goal: Will remain free from infection Outcome: Progressing Goal: Diagnostic test results will improve Outcome: Progressing Goal: Respiratory complications will improve Outcome: Progressing Goal: Cardiovascular complication will be avoided Outcome: Progressing   Problem: Activity: Goal: Risk for activity intolerance will decrease Outcome: Progressing   Problem: Nutrition: Goal: Adequate nutrition will be maintained Outcome: Progressing   Problem: Coping: Goal:  Level of anxiety will decrease Outcome: Progressing   Problem: Elimination: Goal: Will not experience complications related to bowel motility Outcome: Progressing Goal: Will not experience complications related to urinary retention Outcome: Progressing   Problem: Pain Management: Goal: General experience of comfort will improve Outcome: Progressing   Problem: Safety: Goal: Ability to remain free from injury will improve Outcome: Progressing   Problem: Skin Integrity: Goal: Risk for impaired skin integrity will decrease Outcome: Progressing   Problem: Activity: Goal: Ability to tolerate increased activity will improve Outcome: Progressing   Problem: Respiratory: Goal: Ability to maintain a clear airway and adequate ventilation will improve Outcome: Progressing

## 2023-12-29 NOTE — Progress Notes (Signed)
Progress Note   Patient: Meghan Jarvis ZOX:[id number] DOB: 1976/08/30 DOA: 12/08/2023     21 DOS: the patient was seen and examined on 12/29/2023   Brief hospital course: 48 year old female past medical history of alcohol abuse, cocaine abuse, homelessness, AIDS, atrial fibrillation, hypothyroidism, type 2 diabetes mellitus presented to [facility] with acute respiratory failure.  She was found down by a friend and could not wake her up.  She was found to be hypoxic in the 70s.  Since she was obtunded she was intubated and admitted by the critical care service.  He was started on antibiotics for pneumonia.  Urine toxicology positive for cocaine.  Patient extubated on 12/30.  12/12/2023.  Patient feeling okay.  Spoke with patient's friend [name] on the phone.  The patient is dating her son. 1/2.  Patient complained of chest pain.  Cardiac enzymes negative x 2 and EKG did not show any ST elevation. 1/3.  Patient complaining of abdominal pain.  Will get right upper quadrant ultrasound negative.  MRI of the brain did not show any toxoplasmosis lesions but did show advanced cerebral atrophy. 1/4.  Patient complaining of headache.  Will give IV magnesium.  Patient walked 3 feet with physical therapy. 1/5.  Blood pressure this morning was low and 2 fluid boluses given.  Started on midodrine. 1/6.  Patient complaining of a lot of abdominal pain.  CT scan does not show any acute process and shows better aeration of right lower lung.  CT did show constipation.  Will start MiraLAX. 1/7.  Patient feeling good today.  Did not complain of any pain.  Hemoglobin 9.1, platelet count up at 311, white blood count 4.1  1/15.  We do not have a TOC plan at this point of time.  TOC reached out to [facility].  Patient complains of right knee pain.  Uric acid 4.4.  X-ray negative. 1/16.  Patient feeling good and offers no complaints today. 1/17.  Patient complains of a slight headache. 1/18.  Patient complains of pain to  nursing staff but not to me.  Assessment and Plan: * Acute metabolic encephalopathy Mental status improved from admission but patient is not the best historian and does not have the best capacity to make decisions at this time.  [facility] referral by TOC.  MRI did not show any enhancing lesions but did show severe cerebral atrophy.  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  Abdominal pain Patient had a return of abdominal pain yesterday.  Prior imaging is negative.  Patient having bowel movements.  Taper pain medication.  Hypotension Continue 5 mg of midodrine.  AIDS (HCC) Viral load 2 million, CD4 14.  Infectious disease following.  Placed on PCP and MAI prophylaxis with atovaquone and Zithromax.  Toxoplasma antibody IgG came back elevated and MRI of the brain does not show any enhancing lesions.  Infectious disease specialist started on Biktarvy.  Headache Tylenol as needed.  IV magnesium earlier in hospital course.  Cryptococcus antigen negative.  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  Platelet platelet count up in the normal range at 333, white blood cell count up in the normal range at 5.0, hemoglobin 8.7.  Protein-calorie malnutrition, severe Continue supplements  Severe sepsis (HCC) Present on admission with tachycardia, tachypnea, fever, pneumonia, acute respiratory failure and acute metabolic encephalopathy.  Patient completed antibiotics.  Cocaine abuse (HCC) Must stop cocaine  Pneumonia of right lower lobe due to infectious organism Completed 5 days of antibiotic.  Acute hypoxic respiratory failure (HCC) Resolved.  Patient extubated on 12/30.  Patient currently breathing on room air.  Alcohol abuse Continue thiamine multivitamin and folic acid  Uncontrolled type 2 diabetes mellitus with hypoglycemia, without long-term current use of insulin (HCC) Hemoglobin A1c 6.0.   Generalized weakness Physical therapy recommending rehab  Chest pain Atypical in nature.   Troponin x 2 negative.  EKG did not show any ST elevations.  Repeat EKG on 1/17 did not show any ST elevations.  AKI (acute kidney injury) (HCC) Creatinine went up to 1.38 on 1/13.  Creatinine down to 0.8 on 1/16.  Right knee pain No pain today.  Right knee x-ray negative.  Uric acid 4.4.  Continue to monitor.  Underweight (BMI < 18.5) Last BMI 16.76  Diarrhea Likely overflow constipation causing her diarrhea.        Subjective: Patient complained of pain to nursing staff but not to me.  Stated she felt well.  Admitted with acute metabolic encephalopathy, sepsis and pneumonia.  Physical Exam: Vitals:   12/28/23 1522 12/28/23 2154 12/29/23 0833 12/29/23 1529  BP: 100/62 94/62 90/68  101/68  Pulse: 100 98 90 87  Resp: 20 18 15 17   Temp: 98.4 F (36.9 C) 98.4 F (36.9 C) 98.1 F (36.7 C) 98.2 F (36.8 C)  TempSrc: Oral Oral  Oral  SpO2: 100% 100% 100% 99%  Weight:      Height:       Physical Exam HENT:     Head: Normocephalic.  Eyes:     General: Lids are normal.     Conjunctiva/sclera: Conjunctivae normal.  Cardiovascular:     Rate and Rhythm: Normal rate and regular rhythm.     Heart sounds: Normal heart sounds, S1 normal and S2 normal.  Pulmonary:     Breath sounds: Examination of the right-lower field reveals decreased breath sounds. Examination of the left-lower field reveals decreased breath sounds. Decreased breath sounds present. No wheezing, rhonchi or rales.  Abdominal:     Palpations: Abdomen is soft.     Tenderness: There is abdominal tenderness in the right lower quadrant.  Musculoskeletal:     Right lower leg: No swelling.     Left lower leg: No swelling.     Comments: Left thumb swelling.  Skin:    General: Skin is warm.  Neurological:     Mental Status: She is alert.     Data Reviewed: No new data today  Disposition: Status is: Inpatient Remains inpatient appropriate because: DSS looking into options  Planned Discharge Destination: No  plan at this point    Time spent: 27 minutes Case discussed with nursing staff  Author: Alford Highland, MD 12/29/2023 3:48 PM  For on call review [email or web address].

## 2023-12-30 DIAGNOSIS — I9589 Other hypotension: Secondary | ICD-10-CM | POA: Diagnosis not present

## 2023-12-30 DIAGNOSIS — E43 Unspecified severe protein-calorie malnutrition: Secondary | ICD-10-CM | POA: Diagnosis not present

## 2023-12-30 DIAGNOSIS — B20 Human immunodeficiency virus [HIV] disease: Secondary | ICD-10-CM | POA: Diagnosis not present

## 2023-12-30 DIAGNOSIS — G9341 Metabolic encephalopathy: Secondary | ICD-10-CM | POA: Diagnosis not present

## 2023-12-30 LAB — QUANTIFERON-TB GOLD PLUS (RQFGPL)
QuantiFERON Mitogen Value: 0.12 [IU]/mL
QuantiFERON Nil Value: 0.04 [IU]/mL
QuantiFERON TB1 Ag Value: 0.04 [IU]/mL
QuantiFERON TB2 Ag Value: 0.04 [IU]/mL

## 2023-12-30 LAB — QUANTIFERON-TB GOLD PLUS: QuantiFERON-TB Gold Plus: UNDETERMINED — AB

## 2023-12-30 MED ORDER — LIDOCAINE 5 % EX PTCH
1.0000 | MEDICATED_PATCH | CUTANEOUS | Status: DC
  Administered 2023-12-30 – 2024-05-06 (×114): 1 via TRANSDERMAL
  Filled 2023-12-30 (×125): qty 1

## 2023-12-30 MED ORDER — IPRATROPIUM-ALBUTEROL 0.5-2.5 (3) MG/3ML IN SOLN
3.0000 mL | Freq: Four times a day (QID) | RESPIRATORY_TRACT | Status: DC | PRN
  Filled 2023-12-30 (×2): qty 3

## 2023-12-30 MED ORDER — MIDODRINE HCL 5 MG PO TABS
2.5000 mg | ORAL_TABLET | Freq: Three times a day (TID) | ORAL | Status: DC
  Administered 2023-12-30 – 2024-01-06 (×19): 2.5 mg via ORAL
  Filled 2023-12-30 (×5): qty 1
  Filled 2023-12-30: qty 0.5
  Filled 2023-12-30 (×15): qty 1

## 2023-12-30 NOTE — Plan of Care (Signed)
Patient alert & oriented to person and place. Currently without domicile, mood labile. Contact precautions maintained. Med compliant takes pain meds with consistency for back and knee pain. Has lidocaine patch on knee and back. Good appetite. Impulsive and vacates bed is ambulatory with one assist bed alarm on and patient disarms. Safety precautions maintained.    Problem: Coping: Goal: Ability to adjust to condition or change in health will improve Outcome: Progressing   Problem: Fluid Volume: Goal: Ability to maintain a balanced intake and output will improve Outcome: Progressing   Problem: Health Behavior/Discharge Planning: Goal: Ability to manage health-related needs will improve Outcome: Progressing   Problem: Nutritional: Goal: Maintenance of adequate nutrition will improve Outcome: Progressing   Problem: Skin Integrity: Goal: Risk for impaired skin integrity will decrease Outcome: Progressing   Problem: Education: Goal: Knowledge of General Education information will improve Description: Including pain rating scale, medication(s)/side effects and non-pharmacologic comfort measures Outcome: Progressing   Problem: Activity: Goal: Risk for activity intolerance will decrease Outcome: Progressing   Problem: Nutrition: Goal: Adequate nutrition will be maintained Outcome: Progressing

## 2023-12-30 NOTE — Progress Notes (Signed)
Progress Note   Patient: Meghan Jarvis WUJ:[id number] DOB: Jan 07, 1976 DOA: 12/08/2023     22 DOS: the patient was seen and examined on 12/30/2023   Brief hospital course: 48 year old female past medical history of alcohol abuse, cocaine abuse, homelessness, AIDS, atrial fibrillation, hypothyroidism, type 2 diabetes mellitus presented to [facility] with acute respiratory failure.  She was found down by a friend and could not wake her up.  She was found to be hypoxic in the 70s.  Since she was obtunded she was intubated and admitted by the critical care service.  He was started on antibiotics for pneumonia.  Urine toxicology positive for cocaine.  Patient extubated on 12/30.  12/12/2023.  Patient feeling okay.  Spoke with patient's friend [name] on the phone.  The patient is dating her son. 1/2.  Patient complained of chest pain.  Cardiac enzymes negative x 2 and EKG did not show any ST elevation. 1/3.  Patient complaining of abdominal pain.  Will get right upper quadrant ultrasound negative.  MRI of the brain did not show any toxoplasmosis lesions but did show advanced cerebral atrophy. 1/4.  Patient complaining of headache.  Will give IV magnesium.  Patient walked 3 feet with physical therapy. 1/5.  Blood pressure this morning was low and 2 fluid boluses given.  Started on midodrine. 1/6.  Patient complaining of a lot of abdominal pain.  CT scan does not show any acute process and shows better aeration of right lower lung.  CT did show constipation.  Will start MiraLAX. 1/7.  Patient feeling good today.  Did not complain of any pain.  Hemoglobin 9.1, platelet count up at 311, white blood count 4.1  1/15.  We do not have a TOC plan at this point of time.  TOC reached out to [facility]PS.  Patient complains of right knee pain.  Uric acid 4.4.  X-ray negative. 1/16.  Patient feeling good and offers no complaints today. 1/17.  Patient complains of a slight headache. 1/18.  Patient complains of pain to  nursing staff but not to me. 1/19.  Patient in for Ace bandage for her right knee.  Assessment and Plan: * Acute metabolic encephalopathy Mental status improved from admission but patient is not the best historian and does not capacity to make decisions at this time.  DSS referral by TOC.  MRI did not show any enhancing lesions but did show severe cerebral atrophy.  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  Abdominal pain Patient intermittently complains of abdominal pain.  Prior imaging is negative.  Patient having bowel movements.  Taper pain medication.  Hypotension Decrease midodrine to 2.5 mg 3 times daily.  AIDS (HCC) Viral load 2 million, CD4 14.  Infectious disease following.  Placed on PCP and MAI prophylaxis with atovaquone and Zithromax.  Toxoplasma antibody IgG came back elevated and MRI of the brain does not show any enhancing lesions.  Infectious disease specialist started on Biktarvy.  Headache Tylenol as needed.  IV magnesium earlier in hospital course.  Cryptococcus antigen negative.  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  Platelet platelet count up in the normal range at 333, white blood cell count up in the normal range at 5.0, hemoglobin 8.7.  Protein-calorie malnutrition, severe Continue supplements  Severe sepsis (HCC) Present on admission with tachycardia, tachypnea, fever, pneumonia, acute respiratory failure and acute metabolic encephalopathy.  Patient completed antibiotics.  Cocaine abuse (HCC) Must stop cocaine  Pneumonia of right lower lobe due to infectious organism Completed 5 days of  antibiotic.  Acute hypoxic respiratory failure (HCC) Resolved.  Patient extubated on 12/30.  Patient currently breathing on room air.  Alcohol abuse Continue thiamine multivitamin and folic acid  Uncontrolled type 2 diabetes mellitus with hypoglycemia, without long-term current use of insulin (HCC) Hemoglobin A1c 6.0.   Generalized weakness Physical therapy  recommending rehab  Chest pain Atypical in nature.  Troponin x 2 negative.  EKG did not show any ST elevations.  Repeat EKG on 1/17 did not show any ST elevations.  AKI (acute kidney injury) (HCC) Creatinine went up to 1.38 on 1/13.  Creatinine down to 0.8 on 1/16.  Right knee pain Patient asked for Ace bandage.  Right knee x-ray negative.  Uric acid 4.4.  Continue to monitor.  Underweight (BMI < 18.5) Last BMI 18.57  Diarrhea Likely overflow constipation causing her diarrhea.        Subjective: Patient asked for Ace bandage for right knee.  States she did not feel well today but did not elaborate more than that she wanting a Ace bandage for her knee.  Physical Exam: Vitals:   12/30/23 0444 12/30/23 0500 12/30/23 0803 12/30/23 1316  BP: 100/74  103/84 130/88  Pulse:   (!) 110 (!) 108  Resp: 16  19   Temp: 98.7 F (37.1 C)  98.2 F (36.8 C)   TempSrc: Oral  Oral   SpO2: 100%  100% 100%  Weight:  55.4 kg    Height:       Physical Exam HENT:     Head: Normocephalic.  Eyes:     General: Lids are normal.     Conjunctiva/sclera: Conjunctivae normal.  Cardiovascular:     Rate and Rhythm: Normal rate and regular rhythm.     Heart sounds: Normal heart sounds, S1 normal and S2 normal.  Pulmonary:     Breath sounds: Examination of the right-lower field reveals decreased breath sounds. Examination of the left-lower field reveals decreased breath sounds. Decreased breath sounds present. No wheezing, rhonchi or rales.  Abdominal:     Palpations: Abdomen is soft.     Tenderness: There is abdominal tenderness in the right lower quadrant.  Musculoskeletal:     Right knee: No effusion. Decreased range of motion.     Right lower leg: No swelling.     Left lower leg: No swelling.     Comments: Left thumb swelling.  Skin:    General: Skin is warm.  Neurological:     Mental Status: She is alert.     Data Reviewed: No new data today  Disposition: Status is:  Inpatient Remains inpatient appropriate because: DSS to look for next place for her to be Planned Discharge Destination: Unknown    Time spent: 28 minutes  Author: Alford Highland, MD 12/30/2023 3:47 PM  For on call review [email or web address].

## 2023-12-31 DIAGNOSIS — J9601 Acute respiratory failure with hypoxia: Secondary | ICD-10-CM | POA: Diagnosis not present

## 2023-12-31 DIAGNOSIS — T7840XA Allergy, unspecified, initial encounter: Secondary | ICD-10-CM | POA: Diagnosis not present

## 2023-12-31 DIAGNOSIS — B20 Human immunodeficiency virus [HIV] disease: Secondary | ICD-10-CM | POA: Diagnosis not present

## 2023-12-31 DIAGNOSIS — R21 Rash and other nonspecific skin eruption: Secondary | ICD-10-CM

## 2023-12-31 DIAGNOSIS — I9589 Other hypotension: Secondary | ICD-10-CM | POA: Diagnosis not present

## 2023-12-31 DIAGNOSIS — G9341 Metabolic encephalopathy: Secondary | ICD-10-CM | POA: Diagnosis not present

## 2023-12-31 LAB — CBC WITH DIFFERENTIAL/PLATELET
Abs Immature Granulocytes: 0.02 10*3/uL (ref 0.00–0.07)
Basophils Absolute: 0 10*3/uL (ref 0.0–0.1)
Basophils Relative: 1 %
Eosinophils Absolute: 1.3 10*3/uL — ABNORMAL HIGH (ref 0.0–0.5)
Eosinophils Relative: 19 %
HCT: 26 % — ABNORMAL LOW (ref 36.0–46.0)
Hemoglobin: 8.1 g/dL — ABNORMAL LOW (ref 12.0–15.0)
Immature Granulocytes: 0 %
Lymphocytes Relative: 28 %
Lymphs Abs: 1.9 10*3/uL (ref 0.7–4.0)
MCH: 26.3 pg (ref 26.0–34.0)
MCHC: 31.2 g/dL (ref 30.0–36.0)
MCV: 84.4 fL (ref 80.0–100.0)
Monocytes Absolute: 0.7 10*3/uL (ref 0.1–1.0)
Monocytes Relative: 11 %
Neutro Abs: 2.7 10*3/uL (ref 1.7–7.7)
Neutrophils Relative %: 41 %
Platelets: 285 10*3/uL (ref 150–400)
RBC: 3.08 MIL/uL — ABNORMAL LOW (ref 3.87–5.11)
RDW: 15.3 % (ref 11.5–15.5)
Smear Review: NORMAL
WBC: 6.6 10*3/uL (ref 4.0–10.5)
nRBC: 0 % (ref 0.0–0.2)

## 2023-12-31 LAB — BASIC METABOLIC PANEL
Anion gap: 7 (ref 5–15)
BUN: 28 mg/dL — ABNORMAL HIGH (ref 6–20)
CO2: 26 mmol/L (ref 22–32)
Calcium: 9 mg/dL (ref 8.9–10.3)
Chloride: 105 mmol/L (ref 98–111)
Creatinine, Ser: 0.75 mg/dL (ref 0.44–1.00)
GFR, Estimated: >60 mL/min (ref >60)
Glucose, Bld: 97 mg/dL (ref 70–99)
Potassium: 4.1 mmol/L (ref 3.5–5.1)
Sodium: 138 mmol/L (ref 135–145)

## 2023-12-31 MED ORDER — DOCUSATE SODIUM 100 MG PO CAPS
100.0000 mg | ORAL_CAPSULE | Freq: Two times a day (BID) | ORAL | Status: DC
  Administered 2024-01-01 – 2024-02-29 (×62): 100 mg via ORAL
  Filled 2023-12-31 (×97): qty 1

## 2023-12-31 NOTE — Progress Notes (Signed)
Progress Note   Patient: Meghan Jarvis [id number]:[id number] DOB: 02-Jun-1976 DOA: 12/08/2023     23 DOS: the patient was seen and examined on 12/31/2023   Brief hospital course: 48 year old female past medical history of alcohol abuse, cocaine abuse, homelessness, AIDS, atrial fibrillation, hypothyroidism, type 2 diabetes mellitus presented to [facility] with acute respiratory failure.  She was found down by a friend and could not wake her up.  She was found to be hypoxic in the 70s.  Since she was obtunded she was intubated and admitted by the critical care service.  He was started on antibiotics for pneumonia.  Urine toxicology positive for cocaine.  Patient extubated on 12/30.  12/12/2023.  Patient feeling okay.  Spoke with patient's friend [name] on the phone.  The patient is dating her son. 1/2.  Patient complained of chest pain.  Cardiac enzymes negative x 2 and EKG did not show any ST elevation. 1/3.  Patient complaining of abdominal pain.  Will get right upper quadrant ultrasound negative.  MRI of the brain did not show any toxoplasmosis lesions but did show advanced cerebral atrophy. 1/4.  Patient complaining of headache.  Will give IV magnesium.  Patient walked 3 feet with physical therapy. 1/5.  Blood pressure this morning was low and 2 fluid boluses given.  Started on midodrine. 1/6.  Patient complaining of a lot of abdominal pain.  CT scan does not show any acute process and shows better aeration of right lower lung.  CT did show constipation.  Will start MiraLAX. 1/7.  Patient feeling good today.  Did not complain of any pain.  Hemoglobin 9.1, platelet count up at 311, white blood count 4.1  1/15.  We do not have a TOC plan at this point of time.  TOC reached out to [facility]PS.  Patient complains of right knee pain.  Uric acid 4.4.  X-ray negative. 1/16.  Patient feeling good and offers no complaints today. 1/17.  Patient complains of a slight headache. 1/18.  Patient complains of pain to  nursing staff but not to me. 1/19.  Patient in for Ace bandage for her right knee.  Assessment and Plan: * Acute metabolic encephalopathy Mental status improved from admission but patient is not the best historian and does not capacity to make decisions at this time.  DSS referral by TOC.  MRI did not show any enhancing lesions but did show severe cerebral atrophy.  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  Abdominal pain Patient intermittently complains of abdominal pain.  Prior imaging is negative.  Patient having bowel movements.  Taper pain medication.  Hypotension Decreased midodrine to 2.5 mg 3 times daily.  AIDS (HCC) Viral load 2 million, CD4 14.  Infectious disease following.  Placed on PCP and MAI prophylaxis with atovaquone and Zithromax.  Toxoplasma antibody IgG came back elevated and MRI of the brain does not show any enhancing lesions.  Infectious disease specialist started on Biktarvy.  Headache Tylenol as needed.  IV magnesium earlier in hospital course.  Cryptococcus antigen negative.  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  Platelet platelet count up in the normal range at 285, white blood cell count up in the normal range at 6.6, hemoglobin 8.1.  Protein-calorie malnutrition, severe Continue supplements  Severe sepsis (HCC) Present on admission with tachycardia, tachypnea, fever, pneumonia, acute respiratory failure and acute metabolic encephalopathy.  Patient completed antibiotics.  Cocaine abuse (HCC) Must stop cocaine  Pneumonia of right lower lobe due to infectious organism Completed 5 days of  antibiotic.  Acute hypoxic respiratory failure (HCC) Resolved.  Patient extubated on 12/30.  Patient currently breathing on room air.  Alcohol abuse Continue thiamine multivitamin and folic acid  Uncontrolled type 2 diabetes mellitus with hypoglycemia, without long-term current use of insulin (HCC) Hemoglobin A1c 6.0.   Generalized weakness Physical therapy  recommending rehab  Chest pain Atypical in nature.  Troponin x 2 negative.  EKG did not show any ST elevations.  Repeat EKG on 1/17 did not show any ST elevations.  AKI (acute kidney injury) (HCC) Creatinine went up to 1.38 on 1/13.  Creatinine down to 0.8 on 1/16.  Right knee pain Patient asked for Ace bandage.  Right knee x-ray negative.  Uric acid 4.4.  Continue to monitor.  Underweight (BMI < 18.5) Last BMI 19.88  Diarrhea Likely overflow constipation causing her diarrhea.  Patient did not like lactulose or MiraLAX.  Will start Colace.        Subjective: Patient feels okay this morning.  Had a slight headache.  Eating good.  Admitted 22 days ago with acute respiratory failure sepsis and pneumonia.  Physical Exam: Vitals:   12/31/23 0500 12/31/23 0539 12/31/23 0740 12/31/23 1537  BP:  (!) 143/88 90/61   Pulse:  (!) 105 90 94  Resp:  16 17   Temp:  98.6 F (37 C) 98.3 F (36.8 C)   TempSrc:  Oral    SpO2:  100% 100% 100%  Weight: 59.3 kg     Height:       Physical Exam HENT:     Head: Normocephalic.  Eyes:     General: Lids are normal.     Conjunctiva/sclera: Conjunctivae normal.  Cardiovascular:     Rate and Rhythm: Normal rate and regular rhythm.     Heart sounds: Normal heart sounds, S1 normal and S2 normal.  Pulmonary:     Breath sounds: Examination of the right-lower field reveals decreased breath sounds. Examination of the left-lower field reveals decreased breath sounds. Decreased breath sounds present. No wheezing, rhonchi or rales.  Abdominal:     Palpations: Abdomen is soft.     Tenderness: There is abdominal tenderness in the right lower quadrant.  Musculoskeletal:     Right knee: No effusion. Normal range of motion.     Right lower leg: No swelling.     Left lower leg: No swelling.     Comments: Left thumb swelling.  Skin:    General: Skin is warm.  Neurological:     Mental Status: She is alert.     Data Reviewed: Last hemoglobin  8.1   Disposition: Status is: Inpatient Remains inpatient appropriate because: DSS to work on disposition  Planned Discharge Destination: Rehab    Time spent: 28 minutes  Author: Alford Highland, MD 12/31/2023 6:02 PM  For on call review [email or web address].

## 2023-12-31 NOTE — Progress Notes (Signed)
Physical Therapy Treatment Patient Details Name: Meghan Jarvis MRN: [id number] DOB: 1976/04/23 Today's Date: 12/31/2023   History of Present Illness Pt is a 48 year old female admitted with [facility] with acute hypoxic respiratory failure, severe sepsis, community acquired PNA acute metabolic encephalopathy; She was intubated, extubated 12/30    PMH significant for alcohol abuse, cocaine abuse, homelessness, AIDS, atrial fibrillation, hypothyroidism, type 2 diabetes mellitus    PT Comments  Pt demonstrated poor activity tolerance self limiting gait (15' with RW); pt reporting severe back and R LE pain demonstrating slight buckling of R LE during ambulation which pt corrected with Min A. RN made aware of pain; pt was premedicated for pain. Pt demonstrates supervision level bed mobility when self motivated and CGA with tranfers.  Continued PT will assist pt towards greater activity tolerance and functional standing balance for greater safety with mobility.    If plan is discharge home, recommend the following: Assistance with cooking/housework;Direct supervision/assist for financial management;Direct supervision/assist for medications management;Assist for transportation;Help with stairs or ramp for entrance;Supervision due to cognitive status;A little help with walking and/or transfers;A little help with bathing/dressing/bathroom   Can travel by private vehicle        Equipment Recommendations  Rolling walker (2 wheels)    Recommendations for Other Services       Precautions / Restrictions Precautions Precautions: Fall Restrictions Weight Bearing Restrictions Per Provider Order: No     Mobility  Bed Mobility Overal bed mobility: Needs Assistance Bed Mobility: Sit to Supine       Sit to supine: Supervision   General bed mobility comments: pt got into bed intially crawling into bed but pt could not tolerate pain in R LE, with cues pt was able to turn around first then sit down and then  bring LEs into bed at supervision level.    Transfers Overall transfer level: Needs assistance Equipment used: Rolling walker (2 wheels) Transfers: Sit to/from Stand, Bed to chair/wheelchair/BSC Sit to Stand: Contact guard assist   Step pivot transfers: Contact guard assist            Ambulation/Gait Ambulation/Gait assistance: Min assist Gait Distance (Feet): 15 Feet Assistive device: Rolling walker (2 wheels) Gait Pattern/deviations: Antalgic, Step-to pattern Gait velocity: dec     General Gait Details: Pt reported severe back R LE pain demonstrating slight buckling of R LE which pt corrected with Min A.  RN made aware of pain.   Stairs             Wheelchair Mobility     Tilt Bed    Modified Rankin (Stroke Patients Only)       Balance Overall balance assessment: Needs assistance Sitting-balance support: No upper extremity supported, Feet supported Sitting balance-Leahy Scale: Good Sitting balance - Comments: moves in/out of figure-4 position during functional activity, but does require intermittent UE support to stabilize   Standing balance support: Bilateral upper extremity supported Standing balance-Leahy Scale: Poor Standing balance comment: Pt did at times let go of walker and maintain balance but did not show good insight/awareness and ultimately needed close CGA t/o the session.                            Cognition Arousal: Alert Behavior During Therapy: WFL for tasks assessed/performed Overall Cognitive Status: No family/caregiver present to determine baseline cognitive functioning Area of Impairment: Orientation, Attention, Memory, Following commands, Safety/judgement, Awareness, Problem solving  Orientation Level: Disoriented to, Situation Current Attention Level: Focused Memory: Decreased short-term memory, Decreased recall of precautions Following Commands: Follows one step commands inconsistently, Follows  one step commands with increased time Safety/Judgement: Decreased awareness of safety, Decreased awareness of deficits Awareness: Intellectual   General Comments: pt expressing significant pain, able to follow simple commands to help reposition        Exercises      General Comments        Pertinent Vitals/Pain Pain Assessment Pain Score: 10-Worst pain ever Pain Location: back and R LE Pain Descriptors / Indicators: Grimacing, Guarding, Discomfort, Moaning Pain Intervention(s): Patient requesting pain meds-RN notified, Limited activity within patient's tolerance, Monitored during session, Premedicated before session    Home Living                          Prior Function            PT Goals (current goals can now be found in the care plan section) Acute Rehab PT Goals Patient Stated Goal: to feel better PT Goal Formulation: With patient Potential to Achieve Goals: Fair Progress towards PT goals: Progressing toward goals    Frequency    Min 1X/week      PT Plan      Co-evaluation              AM-PAC PT "6 Clicks" Mobility   Outcome Measure  Help needed turning from your back to your side while in a flat bed without using bedrails?: None Help needed moving from lying on your back to sitting on the side of a flat bed without using bedrails?: A Little Help needed moving to and from a bed to a chair (including a wheelchair)?: A Little Help needed standing up from a chair using your arms (e.g., wheelchair or bedside chair)?: A Little Help needed to walk in hospital room?: A Little Help needed climbing 3-5 steps with a railing? : A Lot 6 Click Score: 18    End of Session Equipment Utilized During Treatment: Gait belt Activity Tolerance: Patient limited by pain Patient left: in bed;with call bell/phone within reach;with nursing/sitter in room Nurse Communication: Mobility status PT Visit Diagnosis: Other abnormalities of gait and mobility  (R26.89);Difficulty in walking, not elsewhere classified (R26.2);Muscle weakness (generalized) (M62.81)     Time: 7829-5621 PT Time Calculation (min) (ACUTE ONLY): 16 min  Charges:    $Therapeutic Activity: 8-22 mins PT General Charges $$ ACUTE PT VISIT: 1 Visit                     Hortencia Conradi, PTA  12/31/23, 1:03 PM

## 2023-12-31 NOTE — Progress Notes (Signed)
Date of Admission:  12/08/2023     ID: Meghan Jarvis is a 48 y.o. female  Principal Problem:   Acute metabolic encephalopathy Active Problems:   Alcohol abuse   Acute hypoxic respiratory failure (HCC)   Pneumonia of right lower lobe due to infectious organism   Protein-calorie malnutrition, severe   Uncontrolled type 2 diabetes mellitus with hypoglycemia, without long-term current use of insulin (HCC)   Cocaine abuse (HCC)   AIDS (HCC)   Severe sepsis (HCC)   Chest pain   Pancytopenia (HCC)   Generalized weakness   Diarrhea   Right upper quadrant abdominal pain   Headache   Underweight (BMI < 18.5)   Hypotension   Abdominal pain   Encounter for psychiatric assessment   Rash due to allergy   Right knee pain   AKI (acute kidney injury) (HCC)  ? ROMANDA Jarvis is a 48 y.o. with a history of AIDS, not compliant with meds or visits ,  , cocaine use was brought in by EMS after being found unresponsive on the couch  in an aquaintance place-   Subjective: Pt is standing in the room applying moisturizer No pain    Medications:   atovaquone  1,500 mg Oral Q breakfast   azithromycin  1,200 mg Oral Weekly   bictegravir-emtricitabine-tenofovir AF  1 tablet Oral Daily   DermaCerin   Topical BID   DULoxetine  30 mg Oral QHS   enoxaparin (LOVENOX) injection  40 mg Subcutaneous QHS   feeding supplement  237 mL Oral TID BM   folic acid  1 mg Oral Daily   lidocaine  1 patch Transdermal Q24H   midodrine  2.5 mg Oral TID WC   multivitamin with minerals  1 tablet Oral QHS   pantoprazole  40 mg Oral BID   thiamine  100 mg Oral Daily    Objective: Vital signs in last 24 hours: Patient Vitals for the past 24 hrs:  BP Temp Temp src Pulse Resp SpO2 Weight  12/31/23 0740 90/61 98.3 F (36.8 C) -- 90 17 100 % --  12/31/23 0539 (!) 143/88 98.6 F (37 C) Oral (!) 105 16 100 % --  12/31/23 0500 -- -- -- -- -- -- 59.3 kg  12/30/23 2116 109/73 98 F (36.7 C) Oral 100 18 96 % --   12/30/23 1316 130/88 -- -- (!) 108 -- 100 % --    Awake and alert emaciated Chest b/l air entry Decreased bases Hss1s2 Skin- erythematous rash over buttocks and thighs and legs- excoriations papular eruption CNS grossly non focal   Lab Results    Latest Ref Rng & Units 12/31/2023    5:54 AM 12/27/2023    6:11 AM 12/26/2023    6:11 AM  CBC  WBC 4.0 - 10.5 K/uL 6.6  5.0  5.7   Hemoglobin 12.0 - 15.0 g/dL 8.1  8.7  9.0   Hematocrit 36.0 - 46.0 % 26.0  28.3  29.7   Platelets 150 - 400 K/uL 285  333  372        Latest Ref Rng & Units 12/31/2023    5:54 AM 12/27/2023    6:11 AM 12/26/2023    6:11 AM  CMP  Glucose 70 - 99 mg/dL 97  96  725   BUN 6 - 20 mg/dL 28  37  34   Creatinine 0.44 - 1.00 mg/dL 3.66  4.40  3.47   Sodium 135 - 145 mmol/L 138  140  141   Potassium 3.5 - 5.1 mmol/L 4.1  4.0  3.9   Chloride 98 - 111 mmol/L 105  106  105   CO2 22 - 32 mmol/L 26  25  27    Calcium 8.9 - 10.3 mg/dL 9.0  9.2  9.5   Total Protein 6.5 - 8.1 g/dL  7.9  8.4   Total Bilirubin 0.0 - 1.2 mg/dL  0.3  0.4   Alkaline Phos 38 - 126 U/L  61  67   AST 15 - 41 U/L  26  24   ALT 0 - 44 U/L  26  25       Microbiology: 12/28 BC NG  Toxo IgG > 400 Toxo PCR neg Toxo igM neg RPR NR CMV DNA neg Crypto neg HIV RNA 2 million Cd4 is 14 ( 2.4%) Beta D glucan > 500 (  repeated) Histoplasma neg Fungal antibodies pending Genosure prime- no resistant mutations     Assessment/Plan: Unresponsive /AMS secondary to cocaine use- resolved   Aspiration leading to acute hypoxic resp failure rt Middle lobe and lower lobe consolidation-- was intubated and mechanically ventilated in the beginning. Now extubated Was On zosyn Completed rx Repeat CXR clear   Rt sided abdominl flank, chest pain- CT abd ok- MRI spine deg changes- no acute findings  Rash- likely due to bactrim- changed to Atovaquone- Mepron   H/o fall with recurrent ED presentation- had hurt left shoulder before     Pruritus with  clear blisters- resolved- no new one D.D  Cocaine use Insect bite ??Scabies-  R/o primary dermatological condition like pemphigus ( less likely)  Syphilis- RPR neg   AIDS- non compliant with meds or visits to her Provider- not been in care since 2021- used to be followed at [facility] before Was on Biktarvy at one time and was non compliant then VL now is 2 million and cd4 is 11  Started Biktarvy on 12/14/23 . not sure how much compliance to be expected as her social situation is not conducive and so is her substance use- watch closely for Immune reconstitution inflammatory syndrome Genotype no resistance to NRTI, NNRTI, Integrase inhibitor and PI Will check VL today RISK for IRIS- watch closely   Toxo IgG very high-  MRI brain no CNS lesions  . TOXO PCR negative On  PCP and MAI prophylaxis. 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

## 2024-01-01 DIAGNOSIS — B20 Human immunodeficiency virus [HIV] disease: Secondary | ICD-10-CM | POA: Diagnosis not present

## 2024-01-01 DIAGNOSIS — I9589 Other hypotension: Secondary | ICD-10-CM | POA: Diagnosis not present

## 2024-01-01 DIAGNOSIS — G9341 Metabolic encephalopathy: Secondary | ICD-10-CM | POA: Diagnosis not present

## 2024-01-01 DIAGNOSIS — J9601 Acute respiratory failure with hypoxia: Secondary | ICD-10-CM | POA: Diagnosis not present

## 2024-01-01 DIAGNOSIS — J189 Pneumonia, unspecified organism: Secondary | ICD-10-CM | POA: Diagnosis not present

## 2024-01-01 DIAGNOSIS — F1010 Alcohol abuse, uncomplicated: Secondary | ICD-10-CM | POA: Diagnosis not present

## 2024-01-01 LAB — HIV-1 RNA QUANT-NO REFLEX-BLD
HIV 1 RNA Quant: 34300 {copies}/mL
LOG10 HIV-1 RNA: 4.535 {Log}

## 2024-01-01 MED ORDER — OXYCODONE HCL 5 MG PO TABS
2.5000 mg | ORAL_TABLET | Freq: Three times a day (TID) | ORAL | Status: DC | PRN
  Administered 2024-01-01 – 2024-01-19 (×10): 2.5 mg via ORAL
  Filled 2024-01-01 (×10): qty 1

## 2024-01-01 MED ORDER — CYCLOBENZAPRINE HCL 10 MG PO TABS
5.0000 mg | ORAL_TABLET | Freq: Three times a day (TID) | ORAL | Status: DC | PRN
  Administered 2024-01-01 – 2024-05-05 (×98): 5 mg via ORAL
  Filled 2024-01-01 (×100): qty 1

## 2024-01-01 NOTE — Plan of Care (Signed)
  Problem: Coping: Goal: Ability to adjust to condition or change in health will improve Outcome: Progressing   Problem: Health Behavior/Discharge Planning: Goal: Ability to identify and utilize available resources and services will improve Outcome: Progressing Goal: Ability to manage health-related needs will improve Outcome: Progressing   Problem: Nutritional: Goal: Maintenance of adequate nutrition will improve Outcome: Progressing   Problem: Skin Integrity: Goal: Risk for impaired skin integrity will decrease Outcome: Progressing

## 2024-01-01 NOTE — Progress Notes (Signed)
Date of Admission:  12/08/2023     ID: Meghan Jarvis is a 48 y.o. female  Principal Problem:   Acute metabolic encephalopathy Active Problems:   Alcohol abuse   Acute hypoxic respiratory failure (HCC)   Pneumonia of right lower lobe due to infectious organism   Protein-calorie malnutrition, severe   Uncontrolled type 2 diabetes mellitus with hypoglycemia, without long-term current use of insulin (HCC)   Cocaine abuse (HCC)   AIDS (HCC)   Severe sepsis (HCC)   Chest pain   Pancytopenia (HCC)   Generalized weakness   Diarrhea   Right upper quadrant abdominal pain   Headache   Underweight (BMI < 18.5)   Hypotension   Abdominal pain   Encounter for psychiatric assessment   Rash due to allergy   Right knee pain   AKI (acute kidney injury) (HCC)  ? Meghan Jarvis is a 48 y.o. with a history of AIDS, not compliant with meds or visits ,  , cocaine use was brought in by EMS after being found unresponsive on the couch  in an aquaintance place-   Subjective: Pt is very hungry and eating  No pain currently   Medications:   atovaquone  1,500 mg Oral Q breakfast   azithromycin  1,200 mg Oral Weekly   bictegravir-emtricitabine-tenofovir AF  1 tablet Oral Daily   DermaCerin   Topical BID   docusate sodium  100 mg Oral BID   DULoxetine  30 mg Oral QHS   enoxaparin (LOVENOX) injection  40 mg Subcutaneous QHS   feeding supplement  237 mL Oral TID BM   folic acid  1 mg Oral Daily   lidocaine  1 patch Transdermal Q24H   midodrine  2.5 mg Oral TID WC   multivitamin with minerals  1 tablet Oral QHS   pantoprazole  40 mg Oral BID   thiamine  100 mg Oral Daily    Objective: Vital signs in last 24 hours: Patient Vitals for the past 24 hrs:  BP Temp Temp src Pulse Resp SpO2 Weight  01/01/24 0907 93/65 98.9 F (37.2 C) Oral 100 18 100 % --  01/01/24 0805 98/72 99.3 F (37.4 C) Oral (!) 101 18 100 % --  01/01/24 0615 104/80 98.7 F (37.1 C) Oral 92 20 98 % 55.3 kg  12/31/23  1953 101/70 97.8 F (36.6 C) Oral 92 18 100 % --    Awake and alert emaciated Chest b/l air entry Decreased bases Hss1s2 Skin- erythematous rash over buttocks and thighs and legs- excoriations papular eruption all over her arms, trunk CNS grossly non focal   Lab Results    Latest Ref Rng & Units 12/31/2023    5:54 AM 12/27/2023    6:11 AM 12/26/2023    6:11 AM  CBC  WBC 4.0 - 10.5 K/uL 6.6  5.0  5.7   Hemoglobin 12.0 - 15.0 g/dL 8.1  8.7  9.0   Hematocrit 36.0 - 46.0 % 26.0  28.3  29.7   Platelets 150 - 400 K/uL 285  333  372        Latest Ref Rng & Units 12/31/2023    5:54 AM 12/27/2023    6:11 AM 12/26/2023    6:11 AM  CMP  Glucose 70 - 99 mg/dL 97  96  621   BUN 6 - 20 mg/dL 28  37  34   Creatinine 0.44 - 1.00 mg/dL 3.08  6.57  8.46   Sodium 135 - 145  mmol/L 138  140  141   Potassium 3.5 - 5.1 mmol/L 4.1  4.0  3.9   Chloride 98 - 111 mmol/L 105  106  105   CO2 22 - 32 mmol/L 26  25  27    Calcium 8.9 - 10.3 mg/dL 9.0  9.2  9.5   Total Protein 6.5 - 8.1 g/dL  7.9  8.4   Total Bilirubin 0.0 - 1.2 mg/dL  0.3  0.4   Alkaline Phos 38 - 126 U/L  61  67   AST 15 - 41 U/L  26  24   ALT 0 - 44 U/L  26  25       Microbiology: 12/28 BC NG  Toxo IgG > 400 Toxo PCR neg Toxo igM neg RPR NR CMV DNA neg Crypto neg HIV RNA 2 million>> 34, 000 Cd4 is 14 ( 2.4%) Beta D glucan > 500 (  repeated) Histoplasma neg Fungal antibodies pending Genosure prime- no resistant mutations     Assessment/Plan: Unresponsive /AMS secondary to cocaine use- resolved   Aspiration leading to acute hypoxic resp failure rt Middle lobe and lower lobe consolidation-- was intubated and mechanically ventilated in the beginning. Now extubated Was On zosyn Completed rx Repeat CXR clear   Rt sided abdominl flank, chest pain- CT abd ok- MRI spine deg changes- no acute findings  Rash- likely due to bactrim- changed to Atovaquone- Mepron   H/o fall with recurrent ED presentation- had hurt left  shoulder before     Pruritus with clear blisters- resolved- no new one D.D  Cocaine use Insect bite ??Scabies- may do ivermectin R/o primary dermatological condition like pemphigus ( less likely)  Syphilis- RPR neg   AIDS- non compliant with meds or visits to her Provider- not been in care since 2021- used to be followed at [facility] before Was on Biktarvy at one time and was non compliant then VL now is 2 million and cd4 is 11  Started Biktarvy on 12/14/23 . not sure how much compliance to be expected as her social situation is not conducive and so is her substance use- watch closely for Immune reconstitution inflammatory syndrome Genotype no resistance to NRTI, NNRTI, Integrase inhibitor and PI Repeat Vl shows a significant drop in 3 weeks from 2 million to 34,000  RISK for IRIS- watch closely   Toxo IgG very high-  MRI brain no CNS lesions  . TOXO PCR negative On  PCP and MAI prophylaxis. 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

## 2024-01-01 NOTE — Progress Notes (Signed)
Progress Note   Patient: Meghan Jarvis WGN:[id number] DOB: 12-19-1975 DOA: 12/08/2023     24 DOS: the patient was seen and examined on 01/01/2024   Brief hospital course: 48 year old female past medical history of alcohol abuse, cocaine abuse, homelessness, AIDS, atrial fibrillation, hypothyroidism, type 2 diabetes mellitus presented to [facility] with acute respiratory failure.  She was found down by a friend and could not wake her up.  She was found to be hypoxic in the 70s.  Since she was obtunded she was intubated and admitted by the critical care service.  He was started on antibiotics for pneumonia.  Urine toxicology positive for cocaine.  Patient extubated on 12/30.  12/12/2023.  Patient feeling okay.  Spoke with patient's friend [name] on the phone.  The patient is dating her son. 1/2.  Patient complained of chest pain.  Cardiac enzymes negative x 2 and EKG did not show any ST elevation. 1/3.  Patient complaining of abdominal pain.  Will get right upper quadrant ultrasound negative.  MRI of the brain did not show any toxoplasmosis lesions but did show advanced cerebral atrophy. 1/4.  Patient complaining of headache.  Will give IV magnesium.  Patient walked 3 feet with physical therapy. 1/5.  Blood pressure this morning was low and 2 fluid boluses given.  Started on midodrine. 1/6.  Patient complaining of a lot of abdominal pain.  CT scan does not show any acute process and shows better aeration of right lower lung.  CT did show constipation.  Will start MiraLAX. 1/7.  Patient feeling good today.  Did not complain of any pain.  Hemoglobin 9.1, platelet count up at 311, white blood count 4.1  1/15.  We do not have a TOC plan at this point of time.  TOC reached out to [facility].  Patient complains of right knee pain.  Uric acid 4.4.  X-ray negative. 1/16.  Patient feeling good and offers no complaints today. 1/17.  Patient complains of a slight headache. 1/18.  Patient complains of pain to  nursing staff but not to me. 1/19.  Patient in for Ace bandage for her right knee. 1/21.  I walked patient around the bed to the chair.  She used a walker.  Assessment and Plan: * Acute metabolic encephalopathy Mental status improved from admission but patient is not the best historian and does not capacity to make decisions at this time.  DSS referral by TOC.  MRI did not show any enhancing lesions but did show severe cerebral atrophy.  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  Abdominal pain Patient intermittently complains of abdominal pain.  Prior imaging is negative.  Patient having bowel movements.  Taper pain medication.  Trial of Flexeril.  Hypotension Continue midodrine to 2.5 mg 3 times daily.  AIDS (HCC) Viral load 2 million, CD4 14 on admission.  Infectious disease following.  Placed on PCP and MAI prophylaxis with atovaquone and Zithromax.  Toxoplasma antibody IgG came back elevated and MRI of the brain does not show any enhancing lesions.  Infectious disease specialist started on Biktarvy.  Repeat viral load down to 34,300.  Fungal culture on 1/20 pending.  Headache Tylenol as needed.  IV magnesium earlier in hospital course.  Cryptococcus antigen negative.  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  Platelet platelet count up in the normal range at 285, white blood cell count up in the normal range at 6.6, hemoglobin 8.1.  Protein-calorie malnutrition, severe Continue supplements  Severe sepsis (HCC) Present on admission with tachycardia,  tachypnea, fever, pneumonia, acute respiratory failure and acute metabolic encephalopathy.  Patient completed antibiotics.  Cocaine abuse (HCC) Must stop cocaine  Pneumonia of right lower lobe due to infectious organism Completed 5 days of antibiotic.  Acute hypoxic respiratory failure (HCC) Resolved.  Patient extubated on 12/30.  Patient currently breathing on room air.  Alcohol abuse Continue thiamine multivitamin and folic  acid  Uncontrolled type 2 diabetes mellitus with hypoglycemia, without long-term current use of insulin (HCC) Hemoglobin A1c 6.0.   Generalized weakness Physical therapy recommending rehab  Chest pain Atypical in nature.  Troponin x 2 negative.  EKG did not show any ST elevations.  Repeat EKG on 1/17 did not show any ST elevations.  AKI (acute kidney injury) (HCC) Creatinine went up to 1.38 on 1/13.  Creatinine down to 0.75 on 1/20.  Right knee pain Patient asked for Ace bandage.  Right knee x-ray negative.  Uric acid 4.4.  Continue to monitor.  Underweight (BMI < 18.5) Last BMI 18.54  Diarrhea Likely overflow constipation causing her diarrhea.  Patient did not like lactulose or MiraLAX.  Continue Colace.        Subjective: Patient feels okay.  Always has some pain in her side.  Admitted 23 days ago with acute respiratory failure sepsis and pneumonia.  Physical Exam: Vitals:   12/31/23 1953 01/01/24 0615 01/01/24 0805 01/01/24 0907  BP: 101/70 104/80 98/72 93/65   Pulse: 92 92 (!) 101 100  Resp: 18 20 18 18   Temp: 97.8 F (36.6 C) 98.7 F (37.1 C) 99.3 F (37.4 C) 98.9 F (37.2 C)  TempSrc: Oral Oral Oral Oral  SpO2: 100% 98% 100% 100%  Weight:  55.3 kg    Height:       Physical Exam HENT:     Head: Normocephalic.  Eyes:     General: Lids are normal.     Conjunctiva/sclera: Conjunctivae normal.  Cardiovascular:     Rate and Rhythm: Normal rate and regular rhythm.     Heart sounds: Normal heart sounds, S1 normal and S2 normal.  Pulmonary:     Breath sounds: Examination of the right-lower field reveals decreased breath sounds. Examination of the left-lower field reveals decreased breath sounds. Decreased breath sounds present. No wheezing, rhonchi or rales.  Abdominal:     Palpations: Abdomen is soft.     Tenderness: There is abdominal tenderness in the right lower quadrant.  Musculoskeletal:     Right knee: No effusion. Normal range of motion.     Right  lower leg: No swelling.     Left lower leg: No swelling.     Comments: Patient has some pain in the right flank area to light palpation  Skin:    General: Skin is warm.  Neurological:     Mental Status: She is alert.     Comments: Patient walked around the bed today with the walker of slow gait.     Data Reviewed: Last creatinine 0.75, last hemoglobin 8.1, last white blood cell count 6.6, last platelet count 285 QuantiFERON gold indeterminant on 1/15 Fungitell beta D glucan greater than 500 HIV RNA 34,300 (prior was 2 million) Toxoplasma gondii antibody IgG greater than 400, IgM less than 3 Fungus blood culture on 1/20 still pending  Disposition: Status is: Inpatient Remains inpatient appropriate because: DSS to look into disposition  Planned Discharge Destination: Unknown    Time spent: 27 minutes  Author: Alford Highland, MD 01/01/2024 4:23 PM  For on call review [email or web address].

## 2024-01-02 DIAGNOSIS — I959 Hypotension, unspecified: Secondary | ICD-10-CM | POA: Diagnosis not present

## 2024-01-02 DIAGNOSIS — B20 Human immunodeficiency virus [HIV] disease: Secondary | ICD-10-CM | POA: Diagnosis not present

## 2024-01-02 DIAGNOSIS — J189 Pneumonia, unspecified organism: Secondary | ICD-10-CM | POA: Diagnosis not present

## 2024-01-02 DIAGNOSIS — G9341 Metabolic encephalopathy: Secondary | ICD-10-CM | POA: Diagnosis not present

## 2024-01-02 DIAGNOSIS — F1410 Cocaine abuse, uncomplicated: Secondary | ICD-10-CM | POA: Diagnosis not present

## 2024-01-02 DIAGNOSIS — D61818 Other pancytopenia: Secondary | ICD-10-CM

## 2024-01-02 DIAGNOSIS — T7840XA Allergy, unspecified, initial encounter: Secondary | ICD-10-CM | POA: Diagnosis not present

## 2024-01-02 LAB — CBC
HCT: 26.4 % — ABNORMAL LOW (ref 36.0–46.0)
Hemoglobin: 8.3 g/dL — ABNORMAL LOW (ref 12.0–15.0)
MCH: 26.5 pg (ref 26.0–34.0)
MCHC: 31.4 g/dL (ref 30.0–36.0)
MCV: 84.3 fL (ref 80.0–100.0)
Platelets: 291 10*3/uL (ref 150–400)
RBC: 3.13 MIL/uL — ABNORMAL LOW (ref 3.87–5.11)
RDW: 15.3 % (ref 11.5–15.5)
WBC: 5.1 10*3/uL (ref 4.0–10.5)
nRBC: 0 % (ref 0.0–0.2)

## 2024-01-02 LAB — COMPREHENSIVE METABOLIC PANEL
ALT: 24 U/L (ref 0–44)
AST: 20 U/L (ref 15–41)
Albumin: 3.1 g/dL — ABNORMAL LOW (ref 3.5–5.0)
Alkaline Phosphatase: 53 U/L (ref 38–126)
Anion gap: 10 (ref 5–15)
BUN: 28 mg/dL — ABNORMAL HIGH (ref 6–20)
CO2: 23 mmol/L (ref 22–32)
Calcium: 9.4 mg/dL (ref 8.9–10.3)
Chloride: 106 mmol/L (ref 98–111)
Creatinine, Ser: 0.73 mg/dL (ref 0.44–1.00)
GFR, Estimated: >60 mL/min (ref >60)
Glucose, Bld: 104 mg/dL — ABNORMAL HIGH (ref 70–99)
Potassium: 3.9 mmol/L (ref 3.5–5.1)
Sodium: 139 mmol/L (ref 135–145)
Total Bilirubin: 0.4 mg/dL (ref 0.0–1.2)
Total Protein: 8.6 g/dL — ABNORMAL HIGH (ref 6.5–8.1)

## 2024-01-02 LAB — ABO/RH: ABO/RH(D): A POS

## 2024-01-02 NOTE — Evaluation (Signed)
Physical Therapy Evaluation Patient Details Name: Meghan Jarvis MRN: [id number] DOB: August 19, 1976 Today's Date: 01/02/2024  History of Present Illness  Pt is a 48 year old female admitted with [facility] with acute hypoxic respiratory failure, severe sepsis, community acquired PNA acute metabolic encephalopathy; She was intubated, extubated 12/30    PMH significant for alcohol abuse, cocaine abuse, homelessness, AIDS, atrial fibrillation, hypothyroidism, type 2 diabetes mellitus.   Clinical Impression  Pt alert, displayed lability during session. Would joke with therapist but then become frustrated. Significant time, encouragement, and education provided for pt to participate in any mobility. Supine <> sit with supervision. Pt  referenced abdominal pain as limiting factor (RN notified). Pt did finish two ensures and start eating ice at end of session. She required minA handheld assist to stand, and deferred any true ambulation. Able to side step at EOB with min-modA. Returned to supine with needs in reach. The patient would benefit from further skilled PT intervention to continue to progress towards goals.        If plan is discharge home, recommend the following: Assistance with cooking/housework;Direct supervision/assist for financial management;Direct supervision/assist for medications management;Assist for transportation;Help with stairs or ramp for entrance;Supervision due to cognitive status;A little help with walking and/or transfers;A little help with bathing/dressing/bathroom   Can travel by private vehicle   Yes    Equipment Recommendations Rolling walker (2 wheels)  Recommendations for Other Services       Functional Status Assessment Patient has had a recent decline in their functional status and demonstrates the ability to make significant improvements in function in a reasonable and predictable amount of time.     Precautions / Restrictions Precautions Precautions:  Fall Restrictions Weight Bearing Restrictions Per Provider Order: No      Mobility  Bed Mobility Overal bed mobility: Needs Assistance Bed Mobility: Sit to Supine, Supine to Sit     Supine to sit: Supervision Sit to supine: Supervision        Transfers Overall transfer level: Needs assistance Equipment used: Rolling walker (2 wheels) Transfers: Sit to/from Stand Sit to Stand: Min assist           General transfer comment: unable to truly ambulate, able to take a few steps at EOB with modA and BUE support    Ambulation/Gait                  Stairs            Wheelchair Mobility     Tilt Bed    Modified Rankin (Stroke Patients Only)       Balance Overall balance assessment: Needs assistance Sitting-balance support: No upper extremity supported, Feet supported Sitting balance-Leahy Scale: Good     Standing balance support: Bilateral upper extremity supported Standing balance-Leahy Scale: Poor                               Pertinent Vitals/Pain Pain Assessment Pain Assessment: Faces Faces Pain Scale: Hurts whole lot Pain Location: abdominal pain Pain Descriptors / Indicators: Grimacing, Guarding, Discomfort, Moaning, Aching Pain Intervention(s): Limited activity within patient's tolerance, Monitored during session, Repositioned    Home Living                          Prior Function  Extremity/Trunk Assessment                Communication      Cognition Arousal: Alert Behavior During Therapy: Restless, Lability                           Following Commands: Follows one step commands with increased time       General Comments: pt self limiting        General Comments      Exercises     Assessment/Plan    PT Assessment Patient needs continued PT services  PT Problem List Decreased strength;Decreased range of motion;Decreased activity  tolerance;Decreased balance;Decreased mobility;Decreased knowledge of precautions;Decreased safety awareness;Decreased knowledge of use of DME       PT Treatment Interventions DME instruction;Balance training;Gait training;Neuromuscular re-education;Stair training;Functional mobility training;Patient/family education;Therapeutic activities;Therapeutic exercise    PT Goals (Current goals can be found in the Care Plan section)  Acute Rehab PT Goals Patient Stated Goal: to go home PT Goal Formulation: With patient Time For Goal Achievement: 01/16/24 Potential to Achieve Goals: Fair    Frequency Min 1X/week     Co-evaluation               AM-PAC PT "6 Clicks" Mobility  Outcome Measure Help needed turning from your back to your side while in a flat bed without using bedrails?: None Help needed moving from lying on your back to sitting on the side of a flat bed without using bedrails?: None Help needed moving to and from a bed to a chair (including a wheelchair)?: A Little Help needed standing up from a chair using your arms (e.g., wheelchair or bedside chair)?: A Little Help needed to walk in hospital room?: A Little Help needed climbing 3-5 steps with a railing? : A Lot 6 Click Score: 19    End of Session   Activity Tolerance: Patient limited by pain;Patient tolerated treatment well (pt self limiting) Patient left: in bed;with call bell/phone within reach;with bed alarm set Nurse Communication: Mobility status PT Visit Diagnosis: Other abnormalities of gait and mobility (R26.89);Difficulty in walking, not elsewhere classified (R26.2);Muscle weakness (generalized) (M62.81)    Time: 1610-9604 PT Time Calculation (min) (ACUTE ONLY): 19 min   Charges:     PT Treatments $Therapeutic Activity: 8-22 mins PT General Charges $$ ACUTE PT VISIT: 1 Visit         Olga Coaster PT, DPT 3:19 PM,01/02/24

## 2024-01-02 NOTE — Progress Notes (Signed)
Occupational Therapy Treatment Patient Details Name: Meghan Jarvis MRN: [id number] DOB: 11/25/1976 Today's Date: 01/02/2024   History of present illness Pt is a 48 year old female admitted with [facility] with acute hypoxic respiratory failure, severe sepsis, community acquired PNA acute metabolic encephalopathy; She was intubated, extubated 12/30    PMH significant for alcohol abuse, cocaine abuse, homelessness, AIDS, atrial fibrillation, hypothyroidism, type 2 diabetes mellitus   OT comments  Pt found curled in fetal position in bed with blankets pulled over head, complaining of "its cold". Requires max cuing to participate in ADL session focusing on grooming and UB/LB bathing. Pt requires mod cues for thoroughness to wash UB/LB and to apply deodorant. Able to bathe LB and perform LB dressing from sit<>stands with supervision for balance and min vcs for safety as pt attempts to doff underwear standing on one leg, and "shaking" other foot. With cues, returns to seated to finish doffing. Pt remains limited by impaired cognition, poor awareness of safety and deficits, and will require 24/7 supervision at discharge. Pt making progress towards goals, OT will continue to follow. Patient will benefit from continued inpatient follow up therapy, <3 hours/day.       If plan is discharge home, recommend the following:  Assistance with cooking/housework;Assistance with feeding;Direct supervision/assist for medications management;Direct supervision/assist for financial management;Assist for transportation;Help with stairs or ramp for entrance;Supervision due to cognitive status;A little help with walking and/or transfers;A little help with bathing/dressing/bathroom   Equipment Recommendations  Other (comment)       Precautions / Restrictions Precautions Precautions: Fall Restrictions Weight Bearing Restrictions Per Provider Order: No       Mobility Bed Mobility Overal bed mobility: Needs Assistance Bed  Mobility: Sit to Supine   Sidelying to sit: Supervision Supine to sit: Supervision     General bed mobility comments: pt found sidelying in fetal position, blankets pulled over head / face, complaining of HA and "its cold".    Transfers Overall transfer level: Needs assistance     Sit to Stand: Supervision                 Balance Overall balance assessment: Needs assistance Sitting-balance support: No upper extremity supported, Feet supported Sitting balance-Leahy Scale: Good     Standing balance support: No upper extremity supported, During functional activity Standing balance-Leahy Scale: Good Standing balance comment: stands to don gown with supervision for safety, no LOB or unsteadiness noted                           ADL either performed or assessed with clinical judgement   ADL Overall ADL's : Needs assistance/impaired Eating/Feeding: Sitting Eating/Feeding Details (indicate cue type and reason): OT brought tray to pt for breakfast Grooming: Sitting;Set up;Wash/dry face;Wash/dry hands Grooming Details (indicate cue type and reason): sitting EOB Upper Body Bathing: Set up;Sitting;Cueing for sequencing Upper Body Bathing Details (indicate cue type and reason): cues for thoroughness and participation. pt moaning "its cold" "i have a headache" Lower Body Bathing: Sit to/from stand;Supervison/ safety   Upper Body Dressing : Minimal assistance;Standing Upper Body Dressing Details (indicate cue type and reason): dons gown back/front in standing with minA from OT Lower Body Dressing: Supervision/safety;Sit to/from stand;Cueing for sequencing;Cueing for safety Lower Body Dressing Details (indicate cue type and reason): cues to doff underwear completely as pt attempts to stand on one leg and shake off. OT cues to perform from seated position with pt easily redirectable  Functional mobility during ADLs: Supervision/safety General ADL Comments: Pt  performing ADL session with min vcs from therapist, pt required frequent redirection from perseveration on "it's cold".      Cognition Arousal: Alert Behavior During Therapy: WFL for tasks assessed/performed Overall Cognitive Status: No family/caregiver present to determine baseline cognitive functioning Area of Impairment: Orientation, Attention, Memory, Following commands, Safety/judgement, Awareness, Problem solving                 Orientation Level: Disoriented to, Situation Current Attention Level: Focused Memory: Decreased short-term memory, Decreased recall of precautions Following Commands: Follows one step commands inconsistently, Follows one step commands with increased time Safety/Judgement: Decreased awareness of safety, Decreased awareness of deficits Awareness: Intellectual Problem Solving: Slow processing, Decreased initiation, Difficulty sequencing, Requires verbal cues, Requires tactile cues                     Pertinent Vitals/ Pain       Pain Assessment Pain Assessment: 0-10 Pain Score: 9  Pain Location: HA Pain Descriptors / Indicators: Grimacing, Guarding, Discomfort, Moaning Pain Intervention(s): Limited activity within patient's tolerance, Monitored during session, Repositioned   Frequency  Min 1X/week        Progress Toward Goals  OT Goals(current goals can now be found in the care plan section)  Progress towards OT goals: Progressing toward goals  Acute Rehab OT Goals OT Goal Formulation: With patient Time For Goal Achievement: 01/07/24 Potential to Achieve Goals: Fair ADL Goals Pt Will Perform Grooming: standing;with supervision Pt Will Perform Lower Body Dressing: with supervision;sit to/from stand;sitting/lateral leans Pt Will Transfer to Toilet: with supervision;ambulating Pt Will Perform Toileting - Clothing Manipulation and hygiene: with supervision;sit to/from stand;sitting/lateral leans  Plan         AM-PAC OT "6  Clicks" Daily Activity     Outcome Measure   Help from another person eating meals?: None Help from another person taking care of personal grooming?: A Little Help from another person toileting, which includes using toliet, bedpan, or urinal?: A Little Help from another person bathing (including washing, rinsing, drying)?: A Little Help from another person to put on and taking off regular upper body clothing?: A Little Help from another person to put on and taking off regular lower body clothing?: A Little 6 Click Score: 19    End of Session    OT Visit Diagnosis: Other abnormalities of gait and mobility (R26.89);Muscle weakness (generalized) (M62.81)   Activity Tolerance Patient tolerated treatment well   Patient Left in bed;with call bell/phone within reach;with bed alarm set   Nurse Communication Mobility status        Time: 1610-9604 OT Time Calculation (min): 15 min  Charges: OT General Charges $OT Visit: 1 Visit OT Treatments $Self Care/Home Management : 8-22 mins Danyla Wattley L. Marshun Duva, OTR/L  01/02/24, 10:02 AM

## 2024-01-02 NOTE — Progress Notes (Signed)
Progress Note   Patient: Meghan Jarvis QMV:[id number] DOB: 1975/12/22 DOA: 12/08/2023     25 DOS: the patient was seen and examined on 01/02/2024   Brief hospital course: 48 year old female past medical history of alcohol abuse, cocaine abuse, homelessness, AIDS, atrial fibrillation, hypothyroidism, type 2 diabetes mellitus presented to [facility] with acute respiratory failure.  She was found down by a friend and could not wake her up.  She was found to be hypoxic in the 70s.  Since she was obtunded she was intubated and admitted by the critical care service.  He was started on antibiotics for pneumonia.  Urine toxicology positive for cocaine.  Patient extubated on 12/30.  12/12/2023.  Patient feeling okay.  Spoke with patient's friend [name] on the phone.  The patient is dating her son. 1/2.  Patient complained of chest pain.  Cardiac enzymes negative x 2 and EKG did not show any ST elevation. 1/3.  Patient complaining of abdominal pain.  Will get right upper quadrant ultrasound negative.  MRI of the brain did not show any toxoplasmosis lesions but did show advanced cerebral atrophy. 1/4.  Patient complaining of headache.  Will give IV magnesium.  Patient walked 3 feet with physical therapy. 1/5.  Blood pressure this morning was low and 2 fluid boluses given.  Started on midodrine. 1/6.  Patient complaining of a lot of abdominal pain.  CT scan does not show any acute process and shows better aeration of right lower lung.  CT did show constipation.  Will start MiraLAX. 1/7.  Patient feeling good today.  Did not complain of any pain.  Hemoglobin 9.1, platelet count up at 311, white blood count 4.1  1/15.  We do not have a TOC plan at this point of time.  TOC reached out to APS.  Patient complains of right knee pain.  Uric acid 4.4.  X-ray negative. 1/16.  Patient feeling good and offers no complaints today. 1/17.  Patient complains of a slight headache. 1/18.  Patient complains of pain to  nursing staff but not to me. 1/19.  Patient in for Ace bandage for her right knee. 1/21.  I walked patient around the bed to the chair.  She used a walker.  Will give a trial of Flexeril since patient's pain in her right flank. 1/22: Little agitated due to difficult blood drawn.  Significant lab. QuantiFERON gold indeterminant on 1/15 Fungitell beta D glucan greater than 500 HIV RNA 34,300 (prior was 2 million) Toxoplasma gondii antibody IgG greater than 400, IgM less than 3 Fungus blood culture on 1/20 still pending  Assessment and Plan: * Acute metabolic encephalopathy Mental status improved from admission but patient is not the best historian and does not capacity to make decisions at this time.  DSS referral by TOC.  MRI did not show any enhancing lesions but did show severe cerebral atrophy.  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  Abdominal pain Patient intermittently complains of abdominal pain.  Prior imaging is negative.  Patient having bowel movements.  Taper pain medication.  Trial of Flexeril.  Hypotension Continue midodrine to 2.5 mg 3 times daily.  AIDS (HCC) Viral load 2 million, CD4 14 on admission.  Infectious disease following.  Placed on PCP and MAI prophylaxis with atovaquone and Zithromax.  Toxoplasma antibody IgG came back elevated and MRI of the brain does not show any enhancing lesions.  Infectious disease specialist started on Biktarvy.  Repeat viral load down to 34,300.  Fungal culture on 1/20  pending.  Headache Tylenol as needed.  IV magnesium earlier in hospital course.  Cryptococcus antigen negative.  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  Platelet platelet count up in the normal range at 285, white blood cell count up in the normal range at 6.6, hemoglobin 8.1.  Protein-calorie malnutrition, severe Continue supplements  Severe sepsis (HCC) Present on admission with tachycardia, tachypnea, fever, pneumonia, acute respiratory failure and acute  metabolic encephalopathy.  Patient completed antibiotics.  Cocaine abuse (HCC) Must stop cocaine  Pneumonia of right lower lobe due to infectious organism Completed 5 days of antibiotic.  Acute hypoxic respiratory failure (HCC) Resolved.  Patient extubated on 12/30.  Patient currently breathing on room air.  Alcohol abuse Continue thiamine multivitamin and folic acid  Uncontrolled type 2 diabetes mellitus with hypoglycemia, without long-term current use of insulin (HCC) Hemoglobin A1c 6.0.   Generalized weakness Physical therapy recommending rehab  Chest pain Atypical in nature.  Troponin x 2 negative.  EKG did not show any ST elevations.  Repeat EKG on 1/17 did not show any ST elevations.  AKI (acute kidney injury) (HCC) Creatinine went up to 1.38 on 1/13.  Creatinine down to 0.75 on 1/20.  Right knee pain Patient asked for Ace bandage.  Right knee x-ray negative.  Uric acid 4.4.  Continue to monitor.  Underweight (BMI < 18.5) Last BMI 18.54  Diarrhea Likely overflow constipation causing her diarrhea.  Patient did not like lactulose or MiraLAX.  Continue Colace.      Subjective: Patient seems little agitated and stating that leave me alone, she just had a difficult blood drawn.  Physical Exam: Vitals:   01/01/24 2111 01/02/24 0542 01/02/24 0703 01/02/24 0840  BP: (!) 123/91 100/74  99/67  Pulse: (!) 48 (!) 103  89  Resp:    19  Temp: 98.1 F (36.7 C) 98.5 F (36.9 C)  99 F (37.2 C)  TempSrc:      SpO2: 100% 100%  100%  Weight:   58.9 kg   Height:       General.  Malnourished lady, in no acute distress. Pulmonary.  Lungs clear bilaterally, normal respiratory effort. CV.  Regular rate and rhythm, no JVD, rub or murmur. Abdomen.  Soft, nontender, nondistended, BS positive. CNS.  Alert and oriented .  No focal neurologic deficit. Extremities.  No edema, no cyanosis, pulses intact and symmetrical.    Data Reviewed: Prior data  reviewed.  Disposition: Status is: Inpatient Remains inpatient appropriate because: DSS to look into disposition  Planned Discharge Destination: Unknown  DVT prophylaxis.  Lovenox Time spent: 40 minutes  Author: Arnetha Courser, MD 01/02/2024 4:54 PM  For on call review [email or web address].

## 2024-01-02 NOTE — Progress Notes (Signed)
Patient remains free from any noted signs of acute distress.  No additional medical interventions required during current shift.  Patient did not require any prn pain medication, when assessed.  Patient has rested throughout current.  Patient will continue to be monitored until discharged.

## 2024-01-02 NOTE — Progress Notes (Signed)
Date of Admission:  12/08/2023     ID: Meghan Jarvis is a 48 y.o. female  Principal Problem:   Acute metabolic encephalopathy Active Problems:   Alcohol abuse   Acute hypoxic respiratory failure (HCC)   Pneumonia of right lower lobe due to infectious organism   Protein-calorie malnutrition, severe   Uncontrolled type 2 diabetes mellitus with hypoglycemia, without long-term current use of insulin (HCC)   Cocaine abuse (HCC)   AIDS (HCC)   Severe sepsis (HCC)   Chest pain   Pancytopenia (HCC)   Generalized weakness   Diarrhea   Right upper quadrant abdominal pain   Headache   Underweight (BMI < 18.5)   Hypotension   Abdominal pain   Encounter for psychiatric assessment   Rash due to allergy   Right knee pain   AKI (acute kidney injury) (HCC)  ? Meghan Jarvis is a 48 y.o. with a history of AIDS, not compliant with meds or visits ,  , cocaine use was brought in by EMS after being found unresponsive on the couch  in an aquaintance place-   Subjective: Pt is doing okay Itching better   Medications:   atovaquone  1,500 mg Oral Q breakfast   azithromycin  1,200 mg Oral Weekly   bictegravir-emtricitabine-tenofovir AF  1 tablet Oral Daily   DermaCerin   Topical BID   docusate sodium  100 mg Oral BID   DULoxetine  30 mg Oral QHS   enoxaparin (LOVENOX) injection  40 mg Subcutaneous QHS   feeding supplement  237 mL Oral TID BM   folic acid  1 mg Oral Daily   lidocaine  1 patch Transdermal Q24H   midodrine  2.5 mg Oral TID WC   multivitamin with minerals  1 tablet Oral QHS   pantoprazole  40 mg Oral BID   thiamine  100 mg Oral Daily    Objective: Vital signs in last 24 hours: Patient Vitals for the past 24 hrs:  BP Temp Pulse Resp SpO2 Weight  01/02/24 0840 99/67 99 F (37.2 C) 89 19 100 % --  01/02/24 0703 -- -- -- -- -- 58.9 kg  01/02/24 0542 100/74 98.5 F (36.9 C) (!) 103 -- 100 % --  01/01/24 2111 (!) 123/91 98.1 F (36.7 C) (!) 48 -- 100 % --  01/01/24  1744 103/79 97.8 F (36.6 C) 87 17 100 % --    Awake and alert emaciated Chest b/l air entry Decreased bases [id number] Skin- erythematous rash over buttocks and thighs and legs- excoriations papular eruption all over her arms, trunk much improved Now just hyperpigemtaion           CNS grossly non focal   Lab Results    Latest Ref Rng & Units 01/02/2024    7:32 AM 12/31/2023    5:54 AM 12/27/2023    6:11 AM  CBC  WBC 4.0 - 10.5 K/uL 5.1  6.6  5.0   Hemoglobin 12.0 - 15.0 g/dL 8.3  8.1  8.7   Hematocrit 36.0 - 46.0 % 26.4  26.0  28.3   Platelets 150 - 400 K/uL 291  285  333        Latest Ref Rng & Units 01/02/2024    7:32 AM 12/31/2023    5:54 AM 12/27/2023    6:11 AM  CMP  Glucose 70 - 99 mg/dL 102  97  96   BUN 6 - 20 mg/dL 28  28  37  Creatinine 0.44 - 1.00 mg/dL 5.78  4.69  6.29   Sodium 135 - 145 mmol/L 139  138  140   Potassium 3.5 - 5.1 mmol/L 3.9  4.1  4.0   Chloride 98 - 111 mmol/L 106  105  106   CO2 22 - 32 mmol/L 23  26  25    Calcium 8.9 - 10.3 mg/dL 9.4  9.0  9.2   Total Protein 6.5 - 8.1 g/dL 8.6   7.9   Total Bilirubin 0.0 - 1.2 mg/dL 0.4   0.3   Alkaline Phos 38 - 126 U/L 53   61   AST 15 - 41 U/L 20   26   ALT 0 - 44 U/L 24   26       Microbiology: 12/28 BC NG  Toxo IgG > 400 Toxo PCR neg Toxo igM neg RPR NR CMV DNA neg Crypto neg HIV RNA 2 million>> 34, 000 Cd4 is 14 ( 2.4%) Beta D glucan > 500 (  repeated) Histoplasma neg Fungal antibodies pending Genosure prime- no resistant mutations     Assessment/Plan: Unresponsive /AMS secondary to cocaine use- resolved   Aspiration leading to acute hypoxic resp failure rt Middle lobe and lower lobe consolidation-- was intubated and mechanically ventilated in the beginning. Now extubated Was On zosyn Completed rx Repeat CXR clear   Rt sided abdominl flank, chest pain- CT abd ok- MRI spine deg changes- no acute findings  Rash- likely due to bactrim- changed to Atovaquone- Mepron   H/o  fall with recurrent ED presentation- had hurt left shoulder before     Pruritus with clear blisters- resolved- no new one D.D  Cocaine use Insect bite Pruritus is much better and excoriations are much better- not scabies RPR NEg   AIDS- non compliant with meds or visits to her Provider- not been in care since 2021- used to be followed at [facility] before Was on Biktarvy at one time and was non compliant then VL now is 2 million and cd4 is 11  Started Biktarvy on 12/14/23 . not sure how much compliance to be expected as her social situation is not conducive and so is her substance use- watch closely for Immune reconstitution inflammatory syndrome Genotype no resistance to NRTI, NNRTI, Integrase inhibitor and PI Repeat Vl shows a significant drop in 3 weeks from 2 million to 34,000  RISK for IRIS- watch closely   Toxo IgG very high-  MRI brain no CNS lesions  . TOXO PCR negative On  PCP and MAI prophylaxis. 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

## 2024-01-02 NOTE — Progress Notes (Signed)
Mobility Specialist - Progress Note   01/02/24 1100  Mobility  Activity Ambulated with assistance in room;Ambulated with assistance to bathroom  Level of Assistance Contact guard assist, steadying assist  Assistive Device None  Distance Ambulated (ft) 25 ft  Activity Response Tolerated well  Mobility visit 1 Mobility     Pt lying in bed upon arrival with covers over head. Agreeable to activity. Pt voiced pain in R groin/hip area. Completed bed mobility with supervision. STS and ambulation to bathroom with CGA. Pt noted to have slow, antalgic gait this date. Small BM. New gown and linen provided. Pt declined further activity d/t pain. Pt returned to bed, knees first with RLE flexed, extra time. Verbal cues for safe technique and also minimize pain. Pt left in bed with alarm set, needs in reach.    Filiberto Pinks Mobility Specialist 01/02/24, 11:42 AM

## 2024-01-02 NOTE — Progress Notes (Signed)
Nutrition Follow-up  DOCUMENTATION CODES:   Severe malnutrition in context of chronic illness  INTERVENTION:   -Continue Ensure Enlive po TID, each supplement provides 350 kcal and 20 grams of protein.  -Continue MVI with minerals daily -Continue 1 mg folic acid daily -Continue 409 mg thiamine daily -Continue Magic cup TID with meals, each supplement provides 290 kcal and 9 grams of protein   NUTRITION DIAGNOSIS:   Severe Malnutrition related to chronic illness (HIV, homelessness, substance abuse) as evidenced by percent weight loss, severe fat depletion, severe muscle depletion.  Ongoing  GOAL:   Patient will meet greater than or equal to 90% of their needs  Progressing   MONITOR:   PO intake, Supplement acceptance, Labs, Weight trends, I & O's, Skin  REASON FOR ASSESSMENT:   Consult Enteral/tube feeding initiation and management  ASSESSMENT:   48 y/o female with h/o homelessness, HIV, substance abuse, Afib, mood disorder, hypothyroidism, type 2 diabetes mellitus and depression who is admitted with aspiration PNA.  Reviewed I/O's: +969 ml since 12/19/23  Pt unavailable at time of visit. Attempted to speak with pt via call to hospital room phone, however, unable to reach.   Per psych note on 12/24/23, pt lacks capacity to make healthcare decisions.   Pt enjoys sweets and likes consuming [employer] and Ensure. Pt with good appetite. Noted meal completions 50-85%.   Medications reviewed and include colace, lovenox, folic acid, MVI, protonix, and thiamine.   No wt loss noted over the past year.   Per TOC notes, APS report made due to inability to locate family and possible need for guardianship. TOC following up with DSS.  Labs reviewed: CBGS: 103 (inpatient orders for glycemic control are none).    Diet Order:   Diet Order             DIET DYS 3 Room service appropriate? Yes; Fluid consistency: Thin  Diet effective now                   EDUCATION NEEDS:    Not appropriate for education at this time  Skin:  Skin Assessment: Skin Integrity Issues: Skin Integrity Issues:: Other (Comment) Other: fissure wound to gluteal cleft, partial thickness rt lateral knee abrasion, excoriation to linear rt buttock  Last BM:  12/25/23  Height:   Ht Readings from Last 1 Encounters:  12/09/23 5' 7.99" (1.727 m)    Weight:   Wt Readings from Last 1 Encounters:  01/02/24 58.9 kg    Ideal Body Weight:  63.6 kg  BMI:  Body mass index is 19.75 kg/m.  Estimated Nutritional Needs:   Kcal:  1800-2100kcal/day  Protein:  90-105g/day  Fluid:  1.8-2.1L/day    Levada Schilling, RD, LDN, CDCES Registered Dietitian III Certified Diabetes Care and Education Specialist If unable to reach this RD, please use "RD Inpatient" group chat on secure chat between hours of 8am-4 pm daily

## 2024-01-03 DIAGNOSIS — D61818 Other pancytopenia: Secondary | ICD-10-CM | POA: Diagnosis not present

## 2024-01-03 DIAGNOSIS — I959 Hypotension, unspecified: Secondary | ICD-10-CM | POA: Diagnosis not present

## 2024-01-03 DIAGNOSIS — J189 Pneumonia, unspecified organism: Secondary | ICD-10-CM | POA: Diagnosis not present

## 2024-01-03 DIAGNOSIS — G9341 Metabolic encephalopathy: Secondary | ICD-10-CM | POA: Diagnosis not present

## 2024-01-03 NOTE — Plan of Care (Signed)
  Problem: Health Behavior/Discharge Planning: Goal: Ability to identify and utilize available resources and services will improve Outcome: Progressing   Problem: Nutritional: Goal: Maintenance of adequate nutrition will improve Outcome: Progressing   Problem: Skin Integrity: Goal: Risk for impaired skin integrity will decrease Outcome: Progressing   Problem: Tissue Perfusion: Goal: Adequacy of tissue perfusion will improve Outcome: Progressing   Problem: Clinical Measurements: Goal: Will remain free from infection Outcome: Progressing   Problem: Coping: Goal: Level of anxiety will decrease Outcome: Progressing

## 2024-01-03 NOTE — Progress Notes (Signed)
Progress Note   Patient: Meghan Jarvis ZOX:[id number] DOB: November 30, 1976 DOA: 12/08/2023     26 DOS: the patient was seen and examined on 01/03/2024   Brief hospital course: 48 year old female past medical history of alcohol abuse, cocaine abuse, homelessness, AIDS, atrial fibrillation, hypothyroidism, type 2 diabetes mellitus presented to [facility] with acute respiratory failure.  She was found down by a friend and could not wake her up.  She was found to be hypoxic in the 70s.  Since she was obtunded she was intubated and admitted by the critical care service.  He was started on antibiotics for pneumonia.  Urine toxicology positive for cocaine.  Patient extubated on 12/30.  12/12/2023.  Patient feeling okay.  Spoke with patient's friend [name] on the phone.  The patient is dating her son. 1/2.  Patient complained of chest pain.  Cardiac enzymes negative x 2 and EKG did not show any ST elevation. 1/3.  Patient complaining of abdominal pain.  Will get right upper quadrant ultrasound negative.  MRI of the brain did not show any toxoplasmosis lesions but did show advanced cerebral atrophy. 1/4.  Patient complaining of headache.  Will give IV magnesium.  Patient walked 3 feet with physical therapy. 1/5.  Blood pressure this morning was low and 2 fluid boluses given.  Started on midodrine. 1/6.  Patient complaining of a lot of abdominal pain.  CT scan does not show any acute process and shows better aeration of right lower lung.  CT did show constipation.  Will start MiraLAX. 1/7.  Patient feeling good today.  Did not complain of any pain.  Hemoglobin 9.1, platelet count up at 311, white blood count 4.1  1/15.  We do not have a TOC plan at this point of time.  TOC reached out to APS.  Patient complains of right knee pain.  Uric acid 4.4.  X-ray negative. 1/16.  Patient feeling good and offers no complaints today. 1/17.  Patient complains of a slight headache. 1/18.  Patient complains of pain to  nursing staff but not to me. 1/19.  Patient in for Ace bandage for her right knee. 1/21.  I walked patient around the bed to the chair.  She used a walker.  Will give a trial of Flexeril since patient's pain in her right flank. 1/22: Little agitated due to difficult blood drawn.  Significant lab. QuantiFERON gold indeterminant on 1/15 Fungitell beta D glucan greater than 500 HIV RNA 34,300 (prior was 2 million) Toxoplasma gondii antibody IgG greater than 400, IgM less than 3 Fungus blood culture on 1/20 still pending  Assessment and Plan: * Acute metabolic encephalopathy Mental status improved from admission but patient is not the best historian and does not capacity to make decisions at this time.  DSS referral by TOC.  MRI did not show any enhancing lesions but did show severe cerebral atrophy.  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  Abdominal pain Patient intermittently complains of abdominal pain.  Prior imaging is negative.  Patient having bowel movements.  Taper pain medication.  Trial of Flexeril.  Hypotension Continue midodrine to 2.5 mg 3 times daily.  AIDS (HCC) Viral load 2 million, CD4 14 on admission.  Infectious disease following.  Placed on PCP and MAI prophylaxis with atovaquone and Zithromax.  Toxoplasma antibody IgG came back elevated and MRI of the brain does not show any enhancing lesions.  Infectious disease specialist started on Biktarvy.  Repeat viral load down to 34,300.  Fungal culture on 1/20  pending.  Headache Tylenol as needed.  IV magnesium earlier in hospital course.  Cryptococcus antigen negative.  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  Platelet platelet count up in the normal range at 285, white blood cell count up in the normal range at 6.6, hemoglobin 8.1.  Protein-calorie malnutrition, severe Continue supplements  Severe sepsis (HCC) Present on admission with tachycardia, tachypnea, fever, pneumonia, acute respiratory failure and acute  metabolic encephalopathy.  Patient completed antibiotics.  Cocaine abuse (HCC) Must stop cocaine  Pneumonia of right lower lobe due to infectious organism Completed 5 days of antibiotic.  Acute hypoxic respiratory failure (HCC) Resolved.  Patient extubated on 12/30.  Patient currently breathing on room air.  Alcohol abuse Continue thiamine multivitamin and folic acid  Uncontrolled type 2 diabetes mellitus with hypoglycemia, without long-term current use of insulin (HCC) Hemoglobin A1c 6.0.   Generalized weakness Physical therapy recommending rehab  Chest pain Atypical in nature.  Troponin x 2 negative.  EKG did not show any ST elevations.  Repeat EKG on 1/17 did not show any ST elevations.  AKI (acute kidney injury) (HCC) Creatinine went up to 1.38 on 1/13.  Creatinine down to 0.75 on 1/20.  Right knee pain Patient asked for Ace bandage.  Right knee x-ray negative.  Uric acid 4.4.  Continue to monitor.  Underweight (BMI < 18.5) Last BMI 18.54  Diarrhea Likely overflow constipation causing her diarrhea.  Patient did not like lactulose or MiraLAX.  Continue Colace.      Subjective: Patient was complaining of generalized aches and pain when seen today.  Physical Exam: Vitals:   01/02/24 2206 01/03/24 0357 01/03/24 0904 01/03/24 0930  BP: 111/82  93/64 93/64  Pulse: (!) 104     Resp: 18  18 18   Temp: 99 F (37.2 C)  98.5 F (36.9 C) 98.5 F (36.9 C)  TempSrc:   Oral   SpO2: 100%  95% 95%  Weight:  55.1 kg    Height:       General.  Malnourished lady, in no acute distress. Pulmonary.  Lungs clear bilaterally, normal respiratory effort. CV.  Regular rate and rhythm, no JVD, rub or murmur. Abdomen.  Soft, nontender, nondistended, BS positive. CNS.  Alert and oriented .  No focal neurologic deficit. Extremities.  No edema, no cyanosis, pulses intact and symmetrical.  Data Reviewed: Prior data reviewed.  Disposition: Status is: Inpatient Remains inpatient  appropriate because: DSS to look into disposition  Planned Discharge Destination: Unknown  DVT prophylaxis.  Lovenox Time spent: 40 minutes  Author: Arnetha Courser, MD 01/03/2024 3:37 PM  For on call review [email or web address].

## 2024-01-03 NOTE — Plan of Care (Signed)
  Problem: Coping: Goal: Ability to adjust to condition or change in health will improve Outcome: Progressing   Problem: Fluid Volume: Goal: Ability to maintain a balanced intake and output will improve Outcome: Progressing   Problem: Health Behavior/Discharge Planning: Goal: Ability to identify and utilize available resources and services will improve Outcome: Progressing Goal: Ability to manage health-related needs will improve Outcome: Progressing   Problem: Metabolic: Goal: Ability to maintain appropriate glucose levels will improve Outcome: Progressing   Problem: Nutritional: Goal: Maintenance of adequate nutrition will improve Outcome: Progressing Goal: Progress toward achieving an optimal weight will improve Outcome: Progressing   Problem: Skin Integrity: Goal: Risk for impaired skin integrity will decrease Outcome: Progressing   Problem: Tissue Perfusion: Goal: Adequacy of tissue perfusion will improve Outcome: Progressing   Problem: Education: Goal: Knowledge of General Education information will improve Description: Including pain rating scale, medication(s)/side effects and non-pharmacologic comfort measures Outcome: Progressing   Problem: Health Behavior/Discharge Planning: Goal: Ability to manage health-related needs will improve Outcome: Progressing   Problem: Clinical Measurements: Goal: Ability to maintain clinical measurements within normal limits will improve Outcome: Progressing Goal: Will remain free from infection Outcome: Progressing Goal: Diagnostic test results will improve Outcome: Progressing Goal: Respiratory complications will improve Outcome: Progressing Goal: Cardiovascular complication will be avoided Outcome: Progressing   Problem: Activity: Goal: Risk for activity intolerance will decrease Outcome: Progressing   Problem: Nutrition: Goal: Adequate nutrition will be maintained Outcome: Progressing   Problem: Coping: Goal:  Level of anxiety will decrease Outcome: Progressing   Problem: Elimination: Goal: Will not experience complications related to bowel motility Outcome: Progressing Goal: Will not experience complications related to urinary retention Outcome: Progressing   Problem: Pain Management: Goal: General experience of comfort will improve Outcome: Progressing   Problem: Safety: Goal: Ability to remain free from injury will improve Outcome: Progressing   Problem: Skin Integrity: Goal: Risk for impaired skin integrity will decrease Outcome: Progressing   Problem: Activity: Goal: Ability to tolerate increased activity will improve Outcome: Progressing   Problem: Respiratory: Goal: Ability to maintain a clear airway and adequate ventilation will improve Outcome: Progressing

## 2024-01-04 DIAGNOSIS — D61818 Other pancytopenia: Secondary | ICD-10-CM | POA: Diagnosis not present

## 2024-01-04 DIAGNOSIS — J189 Pneumonia, unspecified organism: Secondary | ICD-10-CM | POA: Diagnosis not present

## 2024-01-04 DIAGNOSIS — J9601 Acute respiratory failure with hypoxia: Secondary | ICD-10-CM | POA: Diagnosis not present

## 2024-01-04 DIAGNOSIS — F1410 Cocaine abuse, uncomplicated: Secondary | ICD-10-CM | POA: Diagnosis not present

## 2024-01-04 DIAGNOSIS — B20 Human immunodeficiency virus [HIV] disease: Secondary | ICD-10-CM | POA: Diagnosis not present

## 2024-01-04 DIAGNOSIS — I959 Hypotension, unspecified: Secondary | ICD-10-CM | POA: Diagnosis not present

## 2024-01-04 DIAGNOSIS — G9341 Metabolic encephalopathy: Secondary | ICD-10-CM | POA: Diagnosis not present

## 2024-01-04 LAB — MISC LABCORP TEST (SEND OUT): Labcorp test code: 164798

## 2024-01-04 NOTE — Progress Notes (Signed)
Date of Admission:  12/08/2023     ID: Meghan Jarvis is a 48 y.o. female  Principal Problem:   Acute metabolic encephalopathy Active Problems:   Alcohol abuse   Acute hypoxic respiratory failure (HCC)   Pneumonia of right lower lobe due to infectious organism   Protein-calorie malnutrition, severe   Uncontrolled type 2 diabetes mellitus with hypoglycemia, without long-term current use of insulin (HCC)   Cocaine abuse (HCC)   AIDS (HCC)   Severe sepsis (HCC)   Chest pain   Pancytopenia (HCC)   Generalized weakness   Diarrhea   Right upper quadrant abdominal pain   Headache   Underweight (BMI < 18.5)   Hypotension   Abdominal pain   Encounter for psychiatric assessment   Rash due to allergy   Right knee pain   AKI (acute kidney injury) (HCC)  ? Meghan Jarvis is a 48 y.o. with a history of AIDS, not compliant with meds or visits ,  , cocaine use was brought in by EMS after being found unresponsive on the couch  in an aquaintance place-   Subjective: Pt is doing okay Sitting in bed No specific complaint No pain abdomen   Medications:   atovaquone  1,500 mg Oral Q breakfast   azithromycin  1,200 mg Oral Weekly   bictegravir-emtricitabine-tenofovir AF  1 tablet Oral Daily   DermaCerin   Topical BID   docusate sodium  100 mg Oral BID   DULoxetine  30 mg Oral QHS   enoxaparin (LOVENOX) injection  40 mg Subcutaneous QHS   feeding supplement  237 mL Oral TID BM   folic acid  1 mg Oral Daily   lidocaine  1 patch Transdermal Q24H   midodrine  2.5 mg Oral TID WC   multivitamin with minerals  1 tablet Oral QHS   pantoprazole  40 mg Oral BID   thiamine  100 mg Oral Daily    Objective: Vital signs in last 24 hours: Patient Vitals for the past 24 hrs:  BP Temp Pulse Resp SpO2 Weight  01/04/24 0828 105/83 99 F (37.2 C) (!) 108 18 100 % --  01/04/24 0503 117/81 99.2 F (37.3 C) (!) 109 18 99 % --  01/04/24 0500 -- -- -- -- -- 55.6 kg  01/03/24 2010 101/73 (!)  100.4 F (38 C) -- 16 100 % --  01/03/24 1607 100/63 (!) 97.5 F (36.4 C) -- 16 100 % --    Awake and alert emaciated Chest b/l air entry Decreased bases [id number] Skin- erythematous rash over buttocks and thighs and legs- excoriations papular eruption all over her arms, trunk much improved Now just hyperpigemtaion           CNS grossly non focal   Lab Results    Latest Ref Rng & Units 01/02/2024    7:32 AM 12/31/2023    5:54 AM 12/27/2023    6:11 AM  CBC  WBC 4.0 - 10.5 K/uL 5.1  6.6  5.0   Hemoglobin 12.0 - 15.0 g/dL 8.3  8.1  8.7   Hematocrit 36.0 - 46.0 % 26.4  26.0  28.3   Platelets 150 - 400 K/uL 291  285  333        Latest Ref Rng & Units 01/02/2024    7:32 AM 12/31/2023    5:54 AM 12/27/2023    6:11 AM  CMP  Glucose 70 - 99 mg/dL 132  97  96   BUN 6 - 20  mg/dL 28  28  37   Creatinine 0.44 - 1.00 mg/dL 1.30  8.65  7.84   Sodium 135 - 145 mmol/L 139  138  140   Potassium 3.5 - 5.1 mmol/L 3.9  4.1  4.0   Chloride 98 - 111 mmol/L 106  105  106   CO2 22 - 32 mmol/L 23  26  25    Calcium 8.9 - 10.3 mg/dL 9.4  9.0  9.2   Total Protein 6.5 - 8.1 g/dL 8.6   7.9   Total Bilirubin 0.0 - 1.2 mg/dL 0.4   0.3   Alkaline Phos 38 - 126 U/L 53   61   AST 15 - 41 U/L 20   26   ALT 0 - 44 U/L 24   26       Microbiology: 12/28 BC NG  Toxo IgG > 400 Toxo PCR neg Toxo igM neg RPR NR CMV DNA neg Crypto neg HIV RNA 2 million>> 34, 000 Cd4 is 14 ( 2.4%) Beta D glucan > 500 (  repeated) Histoplasma neg Fungal antibodies pending Genosure prime- no resistant mutations     Assessment/Plan: Unresponsive /AMS secondary to cocaine use- resolved   Aspiration leading to acute hypoxic resp failure rt Middle lobe and lower lobe consolidation-- was intubated and mechanically ventilated in the beginning. Now extubated Was On zosyn Completed rx Repeat CXR clear   Rt sided abdominl flank, chest pain- CT abd ok- MRI spine deg changes- no acute findings  Rash- likely due to  bactrim- changed to Atovaquone- Mepron   H/o fall with recurrent ED presentation- had hurt left shoulder before     Pruritus with excoriations and hyperpigmentiaon Possibly due to aids, ? Bactrim aggravated it Much better    AIDS- non compliant with meds or visits to her Provider- not been in care since 2021- used to be followed at [facility] before Was on Biktarvy at one time and was non compliant then VL now is 2 million and cd4 is 11  Started Biktarvy on 12/14/23 . not sure how much compliance to be expected as her social situation is not conducive and so is her substance use- watch closely for Immune reconstitution inflammatory syndrome Genotype no resistance to NRTI, NNRTI, Integrase inhibitor and PI Repeat Vl shows a significant drop in 3 weeks from 2 million to 34,000  RISK for IRIS- watch closely   Toxo IgG very high-  MRI brain no CNS lesions  . TOXO PCR negative On  PCP and MAI prophylaxis. 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

## 2024-01-04 NOTE — Plan of Care (Signed)
  Problem: Nutritional: Goal: Maintenance of adequate nutrition will improve Outcome: Progressing   Problem: Skin Integrity: Goal: Risk for impaired skin integrity will decrease Outcome: Progressing   Problem: Tissue Perfusion: Goal: Adequacy of tissue perfusion will improve Outcome: Progressing   Problem: Education: Goal: Knowledge of General Education information will improve Description: Including pain rating scale, medication(s)/side effects and non-pharmacologic comfort measures Outcome: Progressing   Problem: Clinical Measurements: Goal: Will remain free from infection Outcome: Progressing   Problem: Coping: Goal: Level of anxiety will decrease Outcome: Progressing

## 2024-01-04 NOTE — Plan of Care (Signed)
Problem: Coping: Goal: Ability to adjust to condition or change in health will improve Outcome: Progressing   Problem: Fluid Volume: Goal: Ability to maintain a balanced intake and output will improve Outcome: Progressing   Problem: Health Behavior/Discharge Planning: Goal: Ability to identify and utilize available resources and services will improve Outcome: Progressing Goal: Ability to manage health-related needs will improve Outcome: Progressing   Problem: Metabolic: Goal: Ability to maintain appropriate glucose levels will improve Outcome: Progressing   Problem: Nutritional: Goal: Maintenance of adequate nutrition will improve Outcome: Progressing Goal: Progress toward achieving an optimal weight will improve Outcome: Progressing   Problem: Skin Integrity: Goal: Risk for impaired skin integrity will decrease Outcome: Progressing

## 2024-01-04 NOTE — Evaluation (Signed)
Occupational Therapy Re-Evaluation Patient Details Name: Meghan Jarvis MRN: [id number] DOB: Dec 10, 1976 Today's Date: 01/04/2024   History of Present Illness Pt is a 48 year old female admitted with [facility] with acute hypoxic respiratory failure, severe sepsis, community acquired PNA acute metabolic encephalopathy; She was intubated, extubated 12/30    PMH significant for alcohol abuse, cocaine abuse, homelessness, AIDS, atrial fibrillation, hypothyroidism, type 2 diabetes mellitus   Clinical Impression   Pt seen for OT re-evaluation and tx this date.  Pt received seated EOB and agreeable to session focused on bathing and activity tolerance. Pt able to complete majority of bathing from seated position, requiring MIN A for washing her back and SBA-CGA in standing for pt to complete peri-bathing with one hand on RW for stability. Pt able to complete bed mobility with mod indep. Pt required PRN multimodal cues for safety/sequencing, particularly during dual tasking. Pt continues to benefit from skilled OT services to maximize safety and functional independence.      If plan is discharge home, recommend the following: Assistance with cooking/housework;Assistance with feeding;Direct supervision/assist for medications management;Direct supervision/assist for financial management;Assist for transportation;Help with stairs or ramp for entrance;Supervision due to cognitive status;A little help with walking and/or transfers;A little help with bathing/dressing/bathroom    Functional Status Assessment  Patient has had a recent decline in their functional status and demonstrates the ability to make significant improvements in function in a reasonable and predictable amount of time.  Equipment Recommendations  Other (comment) (defer)    Recommendations for Other Services       Precautions / Restrictions Precautions Precautions: Fall Restrictions Weight Bearing Restrictions Per Provider Order: No       Mobility Bed Mobility Overal bed mobility: Modified Independent Bed Mobility: Sit to Supine       Sit to supine: Modified independent (Device/Increase time)        Transfers Overall transfer level: Needs assistance Equipment used: Rolling walker (2 wheels) Transfers: Sit to/from Stand Sit to Stand: Supervision                  Balance Overall balance assessment: Needs assistance Sitting-balance support: No upper extremity supported, Feet supported Sitting balance-Leahy Scale: Good     Standing balance support: Bilateral upper extremity supported, During functional activity, Single extremity supported Standing balance-Leahy Scale: Fair                             ADL either performed or assessed with clinical judgement   ADL Overall ADL's : Needs assistance/impaired     Grooming: Sitting;Set up;Wash/dry face Grooming Details (indicate cue type and reason): sitting EOB Upper Body Bathing: Set up;Sitting;Cueing for sequencing;Minimal assistance Upper Body Bathing Details (indicate cue type and reason): MIN A for washing her back Lower Body Bathing: Sit to/from stand;Contact guard assist;Supervison/ safety Lower Body Bathing Details (indicate cue type and reason): supv for seated LB bathing, requiring SBA-CGA in standing with VC for hand placement on RW and using L hand to complete peribathing Upper Body Dressing : Sitting;Minimal assistance                           Vision         Perception         Praxis         Pertinent Vitals/Pain Pain Assessment Pain Assessment: 0-10 Pain Score: 9  Pain Location: chest pain, visually does not  demonstrate signs of pain though Pain Intervention(s): Limited activity within patient's tolerance, Monitored during session, Repositioned, Patient requesting pain meds-RN notified     Extremity/Trunk Assessment Upper Extremity Assessment Upper Extremity Assessment: Generalized weakness   Lower  Extremity Assessment Lower Extremity Assessment: Generalized weakness   Cervical / Trunk Assessment Cervical / Trunk Assessment: Normal   Communication     Cognition Arousal: Alert Behavior During Therapy: WFL for tasks assessed/performed, Restless Overall Cognitive Status: No family/caregiver present to determine baseline cognitive functioning                                 General Comments: Pt pleasant with this therapist today, follows simple commands with multimodal cues and increased processing time during dual tasking     General Comments       Exercises     Shoulder Instructions      Home Living                                   Additional Comments: per chart pt is homeless, pt did not provide PLOF      Prior Functioning/Environment Prior Level of Function : Patient poor historian/Family not available                        OT Problem List: Decreased strength;Pain;Decreased cognition;Decreased safety awareness;Decreased activity tolerance;Impaired balance (sitting and/or standing);Decreased knowledge of use of DME or AE      OT Treatment/Interventions: Self-care/ADL training;Therapeutic exercise;Therapeutic activities;DME and/or AE instruction;Patient/family education;Balance training;Cognitive remediation/compensation;Energy conservation    OT Goals(Current goals can be found in the care plan section) Acute Rehab OT Goals Patient Stated Goal: get better OT Goal Formulation: With patient Time For Goal Achievement: 01/18/24 Potential to Achieve Goals: Fair ADL Goals Pt Will Perform Grooming: with supervision;standing (remote supervision) Pt Will Transfer to Toilet: with supervision;ambulating (LRAD) Pt Will Perform Toileting - Clothing Manipulation and hygiene: with modified independence;sitting/lateral leans;sit to/from stand  OT Frequency: Min 1X/week    Co-evaluation              AM-PAC OT "6 Clicks" Daily  Activity     Outcome Measure Help from another person eating meals?: None Help from another person taking care of personal grooming?: A Little Help from another person toileting, which includes using toliet, bedpan, or urinal?: A Little Help from another person bathing (including washing, rinsing, drying)?: A Little Help from another person to put on and taking off regular upper body clothing?: A Little Help from another person to put on and taking off regular lower body clothing?: A Little 6 Click Score: 19   End of Session Equipment Utilized During Treatment: Rolling walker (2 wheels) Nurse Communication: Mobility status;Patient requests pain meds  Activity Tolerance: Patient tolerated treatment well Patient left: in bed;with call bell/phone within reach;with bed alarm set  OT Visit Diagnosis: Other abnormalities of gait and mobility (R26.89);Muscle weakness (generalized) (M62.81)                Time: [id number] OT Time Calculation (min): 20 min Charges:  OT General Charges $OT Visit: 1 Visit OT Evaluation $OT Re-eval: 1 Re-eval OT Treatments $Self Care/Home Management : 8-22 mins  Arman Filter., MPH, MS, OTR/L ascom [phone] 01/04/24, 4:58 PM

## 2024-01-04 NOTE — Progress Notes (Signed)
Progress Note   Patient: Meghan Jarvis GNF:[id number] DOB: [birth year] DOA: 12/08/2023     27 DOS: the patient was seen and examined on 01/04/2024   Brief hospital course: 48 year old female past medical history of alcohol abuse, cocaine abuse, homelessness, AIDS, atrial fibrillation, hypothyroidism, type 2 diabetes mellitus presented to [facility] with acute respiratory failure.  She was found down by a friend and could not wake her up.  She was found to be hypoxic in the 70s.  Since she was obtunded she was intubated and admitted by the critical care service.  He was started on antibiotics for pneumonia.  Urine toxicology positive for cocaine.  Patient extubated on 12/30.  12/12/2023.  Patient feeling okay.  Spoke with patient's friend [name] on the phone.  The patient is dating her son. 1/2.  Patient complained of chest pain.  Cardiac enzymes negative x 2 and EKG did not show any ST elevation. 1/3.  Patient complaining of abdominal pain.  Will get right upper quadrant ultrasound negative.  MRI of the brain did not show any toxoplasmosis lesions but did show advanced cerebral atrophy. 1/4.  Patient complaining of headache.  Will give IV magnesium.  Patient walked 3 feet with physical therapy. 1/5.  Blood pressure this morning was low and 2 fluid boluses given.  Started on midodrine. 1/6.  Patient complaining of a lot of abdominal pain.  CT scan does not show any acute process and shows better aeration of right lower lung.  CT did show constipation.  Will start MiraLAX. 1/7.  Patient feeling good today.  Did not complain of any pain.  Hemoglobin 9.1, platelet count up at 311, white blood count 4.1  1/15.  We do not have a TOC plan at this point of time.  TOC reached out to APS.  Patient complains of right knee pain.  Uric acid 4.4.  X-ray negative. 1/16.  Patient feeling good and offers no complaints today. 1/17.  Patient complains of a slight headache. 1/18.  Patient complains of pain to  nursing staff but not to me. 1/19.  Patient in for Ace bandage for her right knee. 1/21.  I walked patient around the bed to the chair.  She used a walker.  Will give a trial of Flexeril since patient's pain in her right flank. 1/22: Little agitated due to difficult blood drawn. 1/23: Patient refusing to answer orientation questions and becoming agitated.  Looks like does not have capacity to make decision but not cooperative for any proper evaluation.  Significant lab. QuantiFERON gold indeterminant on 1/15 Fungitell beta D glucan greater than 500 HIV RNA 34,300 (prior was 2 million) Toxoplasma gondii antibody IgG greater than 400, IgM less than 3 Fungus blood culture on 1/20 still pending  Assessment and Plan: * Acute metabolic encephalopathy Mental status improved from admission but patient is not the best historian and does not capacity to make decisions at this time.  DSS referral by TOC.  MRI did not show any enhancing lesions but did show severe cerebral atrophy.  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  Abdominal pain Patient intermittently complains of abdominal pain.  Prior imaging is negative.  Patient having bowel movements.  Taper pain medication.  Trial of Flexeril.  Hypotension Continue midodrine to 2.5 mg 3 times daily.  AIDS (HCC) Viral load 2 million, CD4 14 on admission.  Infectious disease following.  Placed on PCP and MAI prophylaxis with atovaquone and Zithromax.  Toxoplasma antibody IgG came back elevated and MRI of  the brain does not show any enhancing lesions.  Infectious disease specialist started on Biktarvy.  Repeat viral load down to 34,300.  Fungal culture on 1/20 pending.  Headache Tylenol as needed.  IV magnesium earlier in hospital course.  Cryptococcus antigen negative.  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  Platelet platelet count up in the normal range at 285, white blood cell count up in the normal range at 6.6, hemoglobin  8.1.  Protein-calorie malnutrition, severe Continue supplements  Severe sepsis (HCC) Present on admission with tachycardia, tachypnea, fever, pneumonia, acute respiratory failure and acute metabolic encephalopathy.  Patient completed antibiotics.  Cocaine abuse (HCC) Must stop cocaine  Pneumonia of right lower lobe due to infectious organism Completed 5 days of antibiotic.  Acute hypoxic respiratory failure (HCC) Resolved.  Patient extubated on 12/30.  Patient currently breathing on room air.  Alcohol abuse Continue thiamine multivitamin and folic acid  Uncontrolled type 2 diabetes mellitus with hypoglycemia, without long-term current use of insulin (HCC) Hemoglobin A1c 6.0.   Generalized weakness Physical therapy recommending rehab  Chest pain Atypical in nature.  Troponin x 2 negative.  EKG did not show any ST elevations.  Repeat EKG on 1/17 did not show any ST elevations.  AKI (acute kidney injury) (HCC) Creatinine went up to 1.38 on 1/13.  Creatinine down to 0.75 on 1/20.  Right knee pain Patient asked for Ace bandage.  Right knee x-ray negative.  Uric acid 4.4.  Continue to monitor.  Underweight (BMI < 18.5) Last BMI 18.54  Diarrhea Likely overflow constipation causing her diarrhea.  Patient did not like lactulose or MiraLAX.  Continue Colace.      Subjective: Patient was complaining of some back pain.  Became very agitated when trying to ask few orientation questions.  Did not answer where she is and what year is this.  Physical Exam: Vitals:   01/04/24 0500 01/04/24 0503 01/04/24 0828 01/04/24 1549  BP:  117/81 105/83 90/60  Pulse:  (!) 109 (!) 108 98  Resp:  18 18 18   Temp:  99.2 F (37.3 C) 99 F (37.2 C) 99.1 F (37.3 C)  TempSrc:    Oral  SpO2:  99% 100% 100%  Weight: 55.6 kg     Height:       General.  Malnourished lady, in no acute distress. Pulmonary.  Lungs clear bilaterally, normal respiratory effort. CV.  Regular rate and rhythm, no JVD,  rub or murmur. Abdomen.  Soft, nontender, nondistended, BS positive. CNS.  Alert and oriented to self.  No focal neurologic deficit. Extremities.  No edema, no cyanosis, pulses intact and symmetrical.  Data Reviewed: Prior data reviewed.  Disposition: Status is: Inpatient Remains inpatient appropriate because: DSS to look into disposition  Planned Discharge Destination: Unknown  DVT prophylaxis.  Lovenox Time spent: 41 minutes  Author: Arnetha Courser, MD 01/04/2024 4:59 PM  For on call review [email or web address].

## 2024-01-04 NOTE — Plan of Care (Signed)
  Problem: Coping: Goal: Ability to adjust to condition or change in health will improve Outcome: Progressing   Problem: Fluid Volume: Goal: Ability to maintain a balanced intake and output will improve Outcome: Progressing   Problem: Health Behavior/Discharge Planning: Goal: Ability to identify and utilize available resources and services will improve Outcome: Progressing Goal: Ability to manage health-related needs will improve Outcome: Progressing   Problem: Metabolic: Goal: Ability to maintain appropriate glucose levels will improve Outcome: Progressing   Problem: Nutritional: Goal: Maintenance of adequate nutrition will improve Outcome: Progressing Goal: Progress toward achieving an optimal weight will improve Outcome: Progressing   Problem: Skin Integrity: Goal: Risk for impaired skin integrity will decrease Outcome: Progressing   Problem: Tissue Perfusion: Goal: Adequacy of tissue perfusion will improve Outcome: Progressing   Problem: Education: Goal: Knowledge of General Education information will improve Description: Including pain rating scale, medication(s)/side effects and non-pharmacologic comfort measures Outcome: Progressing   Problem: Health Behavior/Discharge Planning: Goal: Ability to manage health-related needs will improve Outcome: Progressing   Problem: Clinical Measurements: Goal: Ability to maintain clinical measurements within normal limits will improve Outcome: Progressing Goal: Will remain free from infection Outcome: Progressing Goal: Diagnostic test results will improve Outcome: Progressing Goal: Respiratory complications will improve Outcome: Progressing Goal: Cardiovascular complication will be avoided Outcome: Progressing   Problem: Activity: Goal: Risk for activity intolerance will decrease Outcome: Progressing   Problem: Nutrition: Goal: Adequate nutrition will be maintained Outcome: Progressing   Problem: Coping: Goal:  Level of anxiety will decrease Outcome: Progressing   Problem: Elimination: Goal: Will not experience complications related to bowel motility Outcome: Progressing Goal: Will not experience complications related to urinary retention Outcome: Progressing   Problem: Pain Management: Goal: General experience of comfort will improve Outcome: Progressing   Problem: Safety: Goal: Ability to remain free from injury will improve Outcome: Progressing   Problem: Skin Integrity: Goal: Risk for impaired skin integrity will decrease Outcome: Progressing   Problem: Activity: Goal: Ability to tolerate increased activity will improve Outcome: Progressing   Problem: Respiratory: Goal: Ability to maintain a clear airway and adequate ventilation will improve Outcome: Progressing

## 2024-01-04 NOTE — Progress Notes (Signed)
Physical Therapy Treatment Patient Details Name: Meghan Jarvis MRN: [id number] DOB: May 14, 1976 Today's Date: 01/04/2024   History of Present Illness Pt is a 48 year old female admitted with [facility] with acute hypoxic respiratory failure, severe sepsis, community acquired PNA acute metabolic encephalopathy; She was intubated, extubated 12/30    PMH significant for alcohol abuse, cocaine abuse, homelessness, AIDS, atrial fibrillation, hypothyroidism, type 2 diabetes mellitus    PT Comments  Pt sitting EOB upon arrival, limited session due to lunch arrival. Pt currently is able to demonstrate Supervision for bed mobility and transfers. Gait remains limited due to weakness, requiring UE support and/or RW for safe mobility. Cognitive deficits appear to be improving as well as mood and compliance with staff. Will continue to progress per POC. Initial recs for STR remain appropriate.    If plan is discharge home, recommend the following: Assistance with cooking/housework;Direct supervision/assist for financial management;Direct supervision/assist for medications management;Assist for transportation;Help with stairs or ramp for entrance;Supervision due to cognitive status;A little help with walking and/or transfers;A little help with bathing/dressing/bathroom   Can travel by private vehicle     Yes  Equipment Recommendations  Other (comment);Rolling walker (2 wheels) (TBD at next level of care)    Recommendations for Other Services       Precautions / Restrictions Precautions Precautions: Fall Restrictions Weight Bearing Restrictions Per Provider Order: No     Mobility  Bed Mobility Overal bed mobility: Needs Assistance Bed Mobility: Sit to Supine, Supine to Sit Rolling: Modified independent (Device/Increase time)   Supine to sit: Modified independent (Device/Increase time) Sit to supine: Modified independent (Device/Increase time)        Transfers Overall transfer level: Needs  assistance Equipment used: None Transfers: Sit to/from Stand Sit to Stand: Supervision Stand pivot transfers: Supervision Step pivot transfers: Supervision       General transfer comment: Pt appears to be improving cognitively demonstrating incresed ability to perform transfers in room    Ambulation/Gait               General Gait Details: Pt declined further mobility due to eating lunch   Stairs             Wheelchair Mobility     Tilt Bed    Modified Rankin (Stroke Patients Only)       Balance Overall balance assessment: Needs assistance Sitting-balance support: No upper extremity supported, Feet supported Sitting balance-Leahy Scale: Good     Standing balance support: Bilateral upper extremity supported Standing balance-Leahy Scale: Fair                              Cognition Arousal: Alert Behavior During Therapy: WFL for tasks assessed/performed, Restless Overall Cognitive Status: No family/caregiver present to determine baseline cognitive functioning Area of Impairment: Orientation, Attention, Memory, Following commands, Safety/judgement, Awareness, Problem solving                 Orientation Level: Disoriented to, Situation Current Attention Level: Focused   Following Commands: Follows one step commands with increased time Safety/Judgement: Decreased awareness of safety, Decreased awareness of deficits Awareness: Intellectual   General Comments: Pt pleasant with this therapist today.        Exercises      General Comments        Pertinent Vitals/Pain Pain Assessment Pain Assessment: No/denies pain    Home Living  Prior Function            PT Goals (current goals can now be found in the care plan section) Acute Rehab PT Goals Patient Stated Goal: to go home    Frequency    Min 1X/week      PT Plan      Co-evaluation              AM-PAC PT "6 Clicks"  Mobility   Outcome Measure  Help needed turning from your back to your side while in a flat bed without using bedrails?: None Help needed moving from lying on your back to sitting on the side of a flat bed without using bedrails?: None Help needed moving to and from a bed to a chair (including a wheelchair)?: A Little Help needed standing up from a chair using your arms (e.g., wheelchair or bedside chair)?: A Little Help needed to walk in hospital room?: A Little Help needed climbing 3-5 steps with a railing? : A Lot 6 Click Score: 19    End of Session   Activity Tolerance: Patient tolerated treatment well;Other (comment) (Limited activity due to remaining cognitive deficits) Patient left: in bed;with call bell/phone within reach (Pt sitting EOB) Nurse Communication: Mobility status PT Visit Diagnosis: Other abnormalities of gait and mobility (R26.89);Difficulty in walking, not elsewhere classified (R26.2);Muscle weakness (generalized) (M62.81)     Time: 1210-1221 PT Time Calculation (min) (ACUTE ONLY): 11 min  Charges:    $Therapeutic Activity: 8-22 mins PT General Charges $$ ACUTE PT VISIT: 1 Visit                    Zadie Cleverly, PTA  Jannet Askew 01/04/2024, 2:10 PM

## 2024-01-04 NOTE — TOC Progression Note (Signed)
Transition of Care [facility]) - Progression Note    Patient Details  Name: [name] MRN: [id number] Date of Birth: 1976/06/27  [facility] CM/SW Contact  Allena Katz, LCSW Phone Number: 01/04/2024, 9:43 AM  Clinical Narrative:   CSW has followed with [name] with DSS twice about this patient but have not heard back.         Expected Discharge Plan and Services                                               Social Determinants of Health (SDOH) Interventions SDOH Screenings   Food Insecurity: Patient Unable To Answer (12/09/2023)  Recent Concern: Food Insecurity - Food Insecurity Present (09/26/2023)  Housing: Patient Unable To Answer (12/09/2023)  Recent Concern: Housing - Medium Risk (09/26/2023)  Transportation Needs: Patient Unable To Answer (12/09/2023)  Recent Concern: Transportation Needs - Unmet Transportation Needs (10/11/2023)  Utilities: Patient Unable To Answer (12/09/2023)  Recent Concern: Utilities - At Risk (09/25/2023)  Tobacco Use: High Risk (12/08/2023)    Readmission Risk Interventions     No data to display

## 2024-01-05 DIAGNOSIS — J189 Pneumonia, unspecified organism: Secondary | ICD-10-CM | POA: Diagnosis not present

## 2024-01-05 DIAGNOSIS — D61818 Other pancytopenia: Secondary | ICD-10-CM | POA: Diagnosis not present

## 2024-01-05 DIAGNOSIS — G9341 Metabolic encephalopathy: Secondary | ICD-10-CM | POA: Diagnosis not present

## 2024-01-05 DIAGNOSIS — I959 Hypotension, unspecified: Secondary | ICD-10-CM | POA: Diagnosis not present

## 2024-01-05 MED ORDER — LACTATED RINGERS IV SOLN: INTRAVENOUS | Status: AC

## 2024-01-05 MED ORDER — METOPROLOL TARTRATE 25 MG PO TABS
12.5000 mg | ORAL_TABLET | Freq: Two times a day (BID) | ORAL | Status: DC
  Administered 2024-01-06 – 2024-01-12 (×11): 12.5 mg via ORAL
  Filled 2024-01-05 (×13): qty 1

## 2024-01-05 NOTE — Plan of Care (Signed)
  Problem: Coping: Goal: Ability to adjust to condition or change in health will improve Outcome: Progressing   Problem: Fluid Volume: Goal: Ability to maintain a balanced intake and output will improve Outcome: Progressing   Problem: Health Behavior/Discharge Planning: Goal: Ability to identify and utilize available resources and services will improve Outcome: Progressing   Problem: Health Behavior/Discharge Planning: Goal: Ability to identify and utilize available resources and services will improve Outcome: Progressing Goal: Ability to manage health-related needs will improve Outcome: Progressing   Problem: Metabolic: Goal: Ability to maintain appropriate glucose levels will improve Outcome: Progressing   Problem: Metabolic: Goal: Ability to maintain appropriate glucose levels will improve Outcome: Progressing   Problem: Nutritional: Goal: Maintenance of adequate nutrition will improve Outcome: Progressing Goal: Progress toward achieving an optimal weight will improve Outcome: Progressing   Problem: Education: Goal: Knowledge of General Education information will improve Description: Including pain rating scale, medication(s)/side effects and non-pharmacologic comfort measures Outcome: Progressing   Problem: Health Behavior/Discharge Planning: Goal: Ability to manage health-related needs will improve Outcome: Progressing   Problem: Clinical Measurements: Goal: Ability to maintain clinical measurements within normal limits will improve Outcome: Progressing Goal: Will remain free from infection Outcome: Progressing Goal: Diagnostic test results will improve Outcome: Progressing Goal: Respiratory complications will improve Outcome: Progressing Goal: Cardiovascular complication will be avoided Outcome: Progressing

## 2024-01-05 NOTE — Progress Notes (Signed)
Progress Note   Patient: Meghan Jarvis:[id number] DOB: 1976-01-10 DOA: 12/08/2023     28 DOS: the patient was seen and examined on 01/05/2024   Brief hospital course: 48 year old female past medical history of alcohol abuse, cocaine abuse, homelessness, AIDS, atrial fibrillation, hypothyroidism, type 2 diabetes mellitus presented to [facility] with acute respiratory failure.  She was found down by a friend and could not wake her up.  She was found to be hypoxic in the 70s.  Since she was obtunded she was intubated and admitted by the critical care service.  He was started on antibiotics for pneumonia.  Urine toxicology positive for cocaine.  Patient extubated on 12/30.  12/12/2023.  Patient feeling okay.  Spoke with patient's friend [name] on the phone.  The patient is dating her son. 1/2.  Patient complained of chest pain.  Cardiac enzymes negative x 2 and EKG did not show any ST elevation. 1/3.  Patient complaining of abdominal pain.  Will get right upper quadrant ultrasound negative.  MRI of the brain did not show any toxoplasmosis lesions but did show advanced cerebral atrophy. 1/4.  Patient complaining of headache.  Will give IV magnesium.  Patient walked 3 feet with physical therapy. 1/5.  Blood pressure this morning was low and 2 fluid boluses given.  Started on midodrine. 1/6.  Patient complaining of a lot of abdominal pain.  CT scan does not show any acute process and shows better aeration of right lower lung.  CT did show constipation.  Will start MiraLAX. 1/7.  Patient feeling good today.  Did not complain of any pain.  Hemoglobin 9.1, platelet count up at 311, white blood count 4.1  1/15.  We do not have a TOC plan at this point of time.  TOC reached out to APS.  Patient complains of right knee pain.  Uric acid 4.4.  X-ray negative. 1/16.  Patient feeling good and offers no complaints today. 1/17.  Patient complains of a slight headache. 1/18.  Patient complains of pain to  nursing staff but not to me. 1/19.  Patient in for Ace bandage for her right knee. 1/21.  I walked patient around the bed to the chair.  She used a walker.  Will give a trial of Flexeril since patient's pain in her right flank. 1/22: Little agitated due to difficult blood drawn. 1/23: Patient refusing to answer orientation questions and becoming agitated.  Looks like does not have capacity to make decision but not cooperative for any proper evaluation.  Significant lab. QuantiFERON gold indeterminant on 1/15 Fungitell beta D glucan greater than 500 HIV RNA 34,300 (prior was 2 million) Toxoplasma gondii antibody IgG greater than 400, IgM less than 3 Fungus blood culture on 1/20 still pending  Assessment and Plan: * Acute metabolic encephalopathy Mental status improved from admission but patient is not the best historian and does not capacity to make decisions at this time.  DSS referral by TOC.  MRI did not show any enhancing lesions but did show severe cerebral atrophy.  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  Abdominal pain Patient intermittently complains of abdominal pain.  Prior imaging is negative.  Patient having bowel movements.  Taper pain medication.  Trial of Flexeril.  Hypotension Continue midodrine to 2.5 mg 3 times daily.  AIDS (HCC) Viral load 2 million, CD4 14 on admission.  Infectious disease following.  Placed on PCP and MAI prophylaxis with atovaquone and Zithromax.  Toxoplasma antibody IgG came back elevated and MRI of  the brain does not show any enhancing lesions.  Infectious disease specialist started on Biktarvy.  Repeat viral load down to 34,300.  Fungal culture on 1/20 pending.  Headache Tylenol as needed.  IV magnesium earlier in hospital course.  Cryptococcus antigen negative.  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  Platelet platelet count up in the normal range at 285, white blood cell count up in the normal range at 6.6, hemoglobin  8.1.  Protein-calorie malnutrition, severe Continue supplements  Severe sepsis (HCC) Present on admission with tachycardia, tachypnea, fever, pneumonia, acute respiratory failure and acute metabolic encephalopathy.  Patient completed antibiotics.  Cocaine abuse (HCC) Must stop cocaine  Pneumonia of right lower lobe due to infectious organism Completed 5 days of antibiotic.  Acute hypoxic respiratory failure (HCC) Resolved.  Patient extubated on 12/30.  Patient currently breathing on room air.  Alcohol abuse Continue thiamine multivitamin and folic acid  Uncontrolled type 2 diabetes mellitus with hypoglycemia, without long-term current use of insulin (HCC) Hemoglobin A1c 6.0.   Generalized weakness Physical therapy recommending rehab  Chest pain Atypical in nature.  Troponin x 2 negative.  EKG did not show any ST elevations.  Repeat EKG on 1/17 did not show any ST elevations.  AKI (acute kidney injury) (HCC) Creatinine went up to 1.38 on 1/13.  Creatinine down to 0.75 on 1/20.  Right knee pain Patient asked for Ace bandage.  Right knee x-ray negative.  Uric acid 4.4.  Continue to monitor.  Underweight (BMI < 18.5) Last BMI 18.54  Diarrhea Likely overflow constipation causing her diarrhea.  Patient did not like lactulose or MiraLAX.  Continue Colace.      Subjective: No new nursing concern.  Patient was refusing to talk.  Physical Exam: Vitals:   01/04/24 2046 01/05/24 0500 01/05/24 0557 01/05/24 0853  BP: 93/69  99/74 (!) 107/92  Pulse: (!) 115  (!) 101 (!) 109  Resp: 16  16 20   Temp: 98.5 F (36.9 C)  98.8 F (37.1 C) 99 F (37.2 C)  TempSrc:    Oral  SpO2: 97%  100% 100%  Weight:  53.7 kg    Height:       General.  Malnourished lady, in no acute distress. Pulmonary.  Lungs clear bilaterally, normal respiratory effort. CV.  Regular rate and rhythm, no JVD, rub or murmur. Abdomen.  Soft, nontender, nondistended, BS positive. CNS.  Alert and oriented .   No focal neurologic deficit. Extremities.  No edema, no cyanosis, pulses intact and symmetrical.   Data Reviewed: Prior data reviewed.  Disposition: Status is: Inpatient Remains inpatient appropriate because: DSS to look into disposition  Planned Discharge Destination: Unknown  DVT prophylaxis.  Lovenox Time spent: 40 minutes  Author: Arnetha Courser, MD 01/05/2024 4:50 PM  For on call review [email or web address].

## 2024-01-06 DIAGNOSIS — J189 Pneumonia, unspecified organism: Secondary | ICD-10-CM | POA: Diagnosis not present

## 2024-01-06 DIAGNOSIS — I959 Hypotension, unspecified: Secondary | ICD-10-CM | POA: Diagnosis not present

## 2024-01-06 DIAGNOSIS — D61818 Other pancytopenia: Secondary | ICD-10-CM | POA: Diagnosis not present

## 2024-01-06 DIAGNOSIS — G9341 Metabolic encephalopathy: Secondary | ICD-10-CM | POA: Diagnosis not present

## 2024-01-06 LAB — TYPE AND SCREEN
ABO/RH(D): A POS
Antibody Screen: POSITIVE
DAT, IgG: NEGATIVE
DAT, complement: NEGATIVE
Unit division: 0
Unit division: 0

## 2024-01-06 LAB — BPAM RBC
Blood Product Expiration Date: 202502182359
Blood Product Expiration Date: 202502182359
Unit Type and Rh: 6200
Unit Type and Rh: 6200

## 2024-01-06 MED ORDER — MIDODRINE HCL 5 MG PO TABS
5.0000 mg | ORAL_TABLET | Freq: Three times a day (TID) | ORAL | Status: DC
Start: 2024-01-06 — End: 2024-01-12
  Administered 2024-01-06 – 2024-01-12 (×18): 5 mg via ORAL
  Filled 2024-01-06 (×18): qty 1

## 2024-01-06 NOTE — Progress Notes (Signed)
Mobility Specialist - Progress Note    01/06/24 1533  Mobility  Activity Ambulated with assistance in room;Stood at bedside;Transferred to/from [facility]  Level of Assistance Minimal assist, patient does 75% or more  Assistive Device Front wheel walker  Distance Ambulated (ft) 4 ft  Range of Motion/Exercises Active  Activity Response Tolerated well  Mobility Referral Yes  Mobility visit 1 Mobility   Pt attempting to get out of bed to [facility] upon entry. Pt STS MinA HHA and transferred to [facility] at end of bed. Pt side steps to head of bed HHA and left with needs in reach. Bed alarm activated.   Johnathan Hausen Mobility Specialist 01/06/24, 3:39 PM

## 2024-01-06 NOTE — Progress Notes (Signed)
Progress Note   Patient: Meghan Jarvis:[id number] DOB: 12-22-1975 DOA: 12/08/2023     29 DOS: the patient was seen and examined on 01/06/2024   Brief hospital course: 48 year old female past medical history of alcohol abuse, cocaine abuse, homelessness, AIDS, atrial fibrillation, hypothyroidism, type 2 diabetes mellitus presented to [facility] regional with acute respiratory failure.  She was found down by a friend and could not wake her up.  She was found to be hypoxic in the 70s.  Since she was obtunded she was intubated and admitted by the critical care service.  He was started on antibiotics for pneumonia.  Urine toxicology positive for cocaine.  Patient extubated on 12/30.  12/12/2023.  Patient feeling okay.  Spoke with patient's friend [name] on the phone.  The patient is dating her son. 1/2.  Patient complained of chest pain.  Cardiac enzymes negative x 2 and EKG did not show any ST elevation. 1/3.  Patient complaining of abdominal pain.  Will get right upper quadrant ultrasound negative.  MRI of the brain did not show any toxoplasmosis lesions but did show advanced cerebral atrophy. 1/4.  Patient complaining of headache.  Will give IV magnesium.  Patient walked 3 feet with physical therapy. 1/5.  Blood pressure this morning was low and 2 fluid boluses given.  Started on midodrine. 1/6.  Patient complaining of a lot of abdominal pain.  CT scan does not show any acute process and shows better aeration of right lower lung.  CT did show constipation.  Will start MiraLAX. 1/7.  Patient feeling good today.  Did not complain of any pain.  Hemoglobin 9.1, platelet count up at 311, white blood count 4.1  1/15.  We do not have a TOC plan at this point of time.  TOC reached out to APS.  Patient complains of right knee pain.  Uric acid 4.4.  X-ray negative. 1/16.  Patient feeling good and offers no complaints today. 1/17.  Patient complains of a slight headache. 1/18.  Patient complains of pain to  nursing staff but not to me. 1/19.  Patient in for Ace bandage for her right knee. 1/21.  I walked patient around the bed to the chair.  She used a walker.  Will give a trial of Flexeril since patient's pain in her right flank. 1/22: Little agitated due to difficult blood drawn. 1/23: Patient refusing to answer orientation questions and becoming agitated.  Looks like does not have capacity to make decision but not cooperative for any proper evaluation. 1/24: Giving some IV fluid and also started on low-dose metoprolol due to persistent mild tachycardia.  Significant lab. QuantiFERON gold indeterminant on 1/15 Fungitell beta D glucan greater than 500 HIV RNA 34,300 (prior was 2 million) Toxoplasma gondii antibody IgG greater than 400, IgM less than 3 Fungus blood culture on 1/20 still pending  Assessment and Plan: * Acute metabolic encephalopathy Mental status improved from admission but patient is not the best historian and does not capacity to make decisions at this time.  DSS referral by TOC.  MRI did not show any enhancing lesions but did show severe cerebral atrophy.  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  Abdominal pain Patient intermittently complains of abdominal pain.  Prior imaging is negative.  Patient having bowel movements.  Taper pain medication.  Trial of Flexeril.  Hypotension Continue midodrine to 2.5 mg 3 times daily.  AIDS (HCC) Viral load 2 million, CD4 14 on admission.  Infectious disease following.  Placed on PCP and  MAI prophylaxis with atovaquone and Zithromax.  Toxoplasma antibody IgG came back elevated and MRI of the brain does not show any enhancing lesions.  Infectious disease specialist started on Biktarvy.  Repeat viral load down to 34,300.  Fungal culture on 1/20 pending.  Headache Tylenol as needed.  IV magnesium earlier in hospital course.  Cryptococcus antigen negative.  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  Platelet platelet count up in the  normal range at 285, white blood cell count up in the normal range at 6.6, hemoglobin 8.1.  Protein-calorie malnutrition, severe Continue supplements  Severe sepsis (HCC) Present on admission with tachycardia, tachypnea, fever, pneumonia, acute respiratory failure and acute metabolic encephalopathy.  Patient completed antibiotics.  Cocaine abuse (HCC) Must stop cocaine  Pneumonia of right lower lobe due to infectious organism Completed 5 days of antibiotic.  Acute hypoxic respiratory failure (HCC) Resolved.  Patient extubated on 12/30.  Patient currently breathing on room air.  Alcohol abuse Continue thiamine multivitamin and folic acid  Uncontrolled type 2 diabetes mellitus with hypoglycemia, without long-term current use of insulin (HCC) Hemoglobin A1c 6.0.   Generalized weakness Physical therapy recommending rehab  Chest pain Atypical in nature.  Troponin x 2 negative.  EKG did not show any ST elevations.  Repeat EKG on 1/17 did not show any ST elevations.  AKI (acute kidney injury) (HCC) Creatinine went up to 1.38 on 1/13.  Creatinine down to 0.75 on 1/20.  Right knee pain Patient asked for Ace bandage.  Right knee x-ray negative.  Uric acid 4.4.  Continue to monitor.  Underweight (BMI < 18.5) Last BMI 18.54  Diarrhea Likely overflow constipation causing her diarrhea.  Patient did not like lactulose or MiraLAX.  Continue Colace.      Subjective: Patient with no new complaints.  Physical Exam: Vitals:   01/05/24 2147 01/06/24 0500 01/06/24 0758 01/06/24 0759  BP: 98/69  102/70 102/70  Pulse:   89 94  Resp:   19 19  Temp:   98.3 F (36.8 C) 98.3 F (36.8 C)  TempSrc:      SpO2:   94% 100%  Weight:  55 kg    Height:       General.  Malnourished lady, in no acute distress. Pulmonary.  Lungs clear bilaterally, normal respiratory effort. CV.  Regular rate and rhythm, no JVD, rub or murmur. Abdomen.  Soft, nontender, nondistended, BS positive. CNS.  Alert  and oriented .  No focal neurologic deficit. Extremities.  No edema, no cyanosis, pulses intact and symmetrical.   Data Reviewed: Prior data reviewed.  Disposition: Status is: Inpatient Remains inpatient appropriate because: DSS to look into disposition  Planned Discharge Destination: Unknown  DVT prophylaxis.  Lovenox Time spent: 39 minutes  Author: Arnetha Courser, MD 01/06/2024 4:42 PM  For on call review [email or web address].

## 2024-01-06 NOTE — Plan of Care (Signed)
Problem: Coping: Goal: Ability to adjust to condition or change in health will improve Outcome: Progressing   Problem: Fluid Volume: Goal: Ability to maintain a balanced intake and output will improve Outcome: Progressing   Problem: Metabolic: Goal: Ability to maintain appropriate glucose levels will improve Outcome: Progressing   Problem: Nutritional: Goal: Maintenance of adequate nutrition will improve Outcome: Progressing Goal: Progress toward achieving an optimal weight will improve Outcome: Progressing   Problem: Skin Integrity: Goal: Risk for impaired skin integrity will decrease Outcome: Progressing

## 2024-01-07 ENCOUNTER — Other Ambulatory Visit (HOSPITAL_COMMUNITY): Payer: Self-pay

## 2024-01-07 DIAGNOSIS — J189 Pneumonia, unspecified organism: Secondary | ICD-10-CM | POA: Diagnosis not present

## 2024-01-07 DIAGNOSIS — F1410 Cocaine abuse, uncomplicated: Secondary | ICD-10-CM | POA: Diagnosis not present

## 2024-01-07 DIAGNOSIS — G9341 Metabolic encephalopathy: Secondary | ICD-10-CM | POA: Diagnosis not present

## 2024-01-07 DIAGNOSIS — B20 Human immunodeficiency virus [HIV] disease: Secondary | ICD-10-CM | POA: Diagnosis not present

## 2024-01-07 DIAGNOSIS — I959 Hypotension, unspecified: Secondary | ICD-10-CM | POA: Diagnosis not present

## 2024-01-07 DIAGNOSIS — J9601 Acute respiratory failure with hypoxia: Secondary | ICD-10-CM | POA: Diagnosis not present

## 2024-01-07 MED ORDER — LACTATED RINGERS IV BOLUS
1000.0000 mL | Freq: Once | INTRAVENOUS | Status: AC
  Administered 2024-01-07: 1000 mL via INTRAVENOUS

## 2024-01-07 NOTE — Progress Notes (Signed)
Progress Note   Patient: Meghan Jarvis:[id number] DOB: 1975/12/14 DOA: 12/08/2023     30 DOS: the patient was seen and examined on 01/07/2024   Brief hospital course: 48 year old female past medical history of alcohol abuse, cocaine abuse, homelessness, AIDS, atrial fibrillation, hypothyroidism, type 2 diabetes mellitus presented to [facility] regional with acute respiratory failure.  She was found down by a friend and could not wake her up.  She was found to be hypoxic in the 70s.  Since she was obtunded she was intubated and admitted by the critical care service.  He was started on antibiotics for pneumonia.  Urine toxicology positive for cocaine.  Patient extubated on 12/30.  12/12/2023.  Patient feeling okay.  Spoke with patient's friend [name] on the phone.  The patient is dating her son. 1/2.  Patient complained of chest pain.  Cardiac enzymes negative x 2 and EKG did not show any ST elevation. 1/3.  Patient complaining of abdominal pain.  Will get right upper quadrant ultrasound negative.  MRI of the brain did not show any toxoplasmosis lesions but did show advanced cerebral atrophy. 1/4.  Patient complaining of headache.  Will give IV magnesium.  Patient walked 3 feet with physical therapy. 1/5.  Blood pressure this morning was low and 2 fluid boluses given.  Started on midodrine. 1/6.  Patient complaining of a lot of abdominal pain.  CT scan does not show any acute process and shows better aeration of right lower lung.  CT did show constipation.  Will start MiraLAX. 1/7.  Patient feeling good today.  Did not complain of any pain.  Hemoglobin 9.1, platelet count up at 311, white blood count 4.1  1/15.  We do not have a TOC plan at this point of time.  TOC reached out to APS.  Patient complains of right knee pain.  Uric acid 4.4.  X-ray negative. 1/16.  Patient feeling good and offers no complaints today. 1/17.  Patient complains of a slight headache. 1/18.  Patient complains of pain to  nursing staff but not to me. 1/19.  Patient in for Ace bandage for her right knee. 1/21.  I walked patient around the bed to the chair.  She used a walker.  Will give a trial of Flexeril since patient's pain in her right flank. 1/22: Little agitated due to difficult blood drawn. 1/23: Patient refusing to answer orientation questions and becoming agitated.  Looks like does not have capacity to make decision but not cooperative for any proper evaluation. 1/24: Giving some IV fluid and also started on low-dose metoprolol due to persistent mild tachycardia.  Significant lab. QuantiFERON gold indeterminant on 1/15 Fungitell beta D glucan greater than 500 HIV RNA 34,300 (prior was 2 million) Toxoplasma gondii antibody IgG greater than 400, IgM less than 3 Fungus blood culture on 1/20 still pending Indeterminate results for coccidioides immitis antibodies  Assessment and Plan: * Acute metabolic encephalopathy Mental status improved from admission but patient is not the best historian and does not capacity to make decisions at this time.  DSS referral by TOC.  MRI did not show any enhancing lesions but did show severe cerebral atrophy.  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  Abdominal pain Patient intermittently complains of abdominal pain.  Prior imaging is negative.  Patient having bowel movements.  Taper pain medication.  Trial of Flexeril.  Hypotension Continue midodrine to 2.5 mg 3 times daily.  AIDS (HCC) Viral load 2 million, CD4 14 on admission.  Infectious disease  following.  Placed on PCP and MAI prophylaxis with atovaquone and Zithromax.  Toxoplasma antibody IgG came back elevated and MRI of the brain does not show any enhancing lesions.  Infectious disease specialist started on Biktarvy.  Repeat viral load down to 34,300.  Fungal culture on 1/20 pending.  Headache Tylenol as needed.  IV magnesium earlier in hospital course.  Cryptococcus antigen negative.  Pancytopenia  (HCC) Secondary to HIV.  Platelet platelet count up in the normal range at 285, white blood cell count up in the normal range at 6.6, hemoglobin 8.1.  Protein-calorie malnutrition, severe Continue supplements  Severe sepsis (HCC) Present on admission with tachycardia, tachypnea, fever, pneumonia, acute respiratory failure and acute metabolic encephalopathy.  Patient completed antibiotics.  Cocaine abuse (HCC) Must stop cocaine  Pneumonia of right lower lobe due to infectious organism Completed 5 days of antibiotic.  Acute hypoxic respiratory failure (HCC) Resolved.  Patient extubated on 12/30.  Patient currently breathing on room air.  Alcohol abuse Continue thiamine multivitamin and folic acid  Uncontrolled type 2 diabetes mellitus with hypoglycemia, without long-term current use of insulin (HCC) Hemoglobin A1c 6.0.   Generalized weakness Physical therapy recommending rehab  Chest pain Atypical in nature.  Troponin x 2 negative.  EKG did not show any ST elevations.  Repeat EKG on 1/17 did not show any ST elevations.  AKI (acute kidney injury) (HCC) Creatinine went up to 1.38 on 1/13.  Creatinine down to 0.75 on 1/20.  Right knee pain Patient asked for Ace bandage.  Right knee x-ray negative.  Uric acid 4.4.  Continue to monitor.  Underweight (BMI < 18.5) Last BMI 18.54  Diarrhea Likely overflow constipation causing her diarrhea.  Patient did not like lactulose or MiraLAX.  Continue Colace.      Subjective: Patient was lying comfortably with no new concern when seen today.  Physical Exam: Vitals:   01/06/24 1811 01/06/24 2041 01/07/24 0533 01/07/24 0821  BP: 102/68 110/77 94/67 103/68  Pulse: (!) 105  90 90  Resp: 18   18  Temp: 99.1 F (37.3 C) 98.8 F (37.1 C) 98.1 F (36.7 C) 98.7 F (37.1 C)  TempSrc:  Oral Oral   SpO2: 100% 100% 100% 100%  Weight:      Height:       General.  Malnourished lady, in no acute distress. Pulmonary.  Lungs clear  bilaterally, normal respiratory effort. CV.  Regular rate and rhythm, no JVD, rub or murmur. Abdomen.  Soft, nontender, nondistended, BS positive. CNS.  Alert and oriented .  No focal neurologic deficit. Extremities.  No edema, no cyanosis, pulses intact and symmetrical.  Data Reviewed: Prior data reviewed.  Disposition: Status is: Inpatient Remains inpatient appropriate because: DSS to look into disposition  Planned Discharge Destination: Unknown  DVT prophylaxis.  Lovenox Time spent: 38 minutes  Author: Arnetha Courser, MD 01/07/2024 3:49 PM  For on call review [email or web address].

## 2024-01-07 NOTE — Progress Notes (Signed)
Date of Admission:  12/08/2023     ID: Meghan Jarvis is a 48 y.o. female  Principal Problem:   Acute metabolic encephalopathy Active Problems:   Alcohol abuse   Acute hypoxic respiratory failure (HCC)   Pneumonia of right lower lobe due to infectious organism   Protein-calorie malnutrition, severe   Uncontrolled type 2 diabetes mellitus with hypoglycemia, without long-term current use of insulin (HCC)   Cocaine abuse (HCC)   AIDS (HCC)   Severe sepsis (HCC)   Chest pain   Pancytopenia (HCC)   Generalized weakness   Diarrhea   Right upper quadrant abdominal pain   Headache   Underweight (BMI < 18.5)   Hypotension   Abdominal pain   Encounter for psychiatric assessment   Rash due to allergy   Right knee pain   AKI (acute kidney injury) (HCC)  ? Meghan Jarvis is a 48 y.o. with a history of AIDS, not compliant with meds or visits ,  , cocaine use was brought in by EMS after being found unresponsive on the couch  in an aquaintance place-   Subjective: Continues to improve slowly   Medications:   atovaquone  1,500 mg Oral Q breakfast   azithromycin  1,200 mg Oral Weekly   bictegravir-emtricitabine-tenofovir AF  1 tablet Oral Daily   DermaCerin   Topical BID   docusate sodium  100 mg Oral BID   DULoxetine  30 mg Oral QHS   enoxaparin (LOVENOX) injection  40 mg Subcutaneous QHS   feeding supplement  237 mL Oral TID BM   folic acid  1 mg Oral Daily   lidocaine  1 patch Transdermal Q24H   metoprolol tartrate  12.5 mg Oral BID   midodrine  5 mg Oral TID WC   multivitamin with minerals  1 tablet Oral QHS   pantoprazole  40 mg Oral BID   thiamine  100 mg Oral Daily    Objective: Vital signs in last 24 hours: Patient Vitals for the past 24 hrs:  BP Temp Temp src Pulse Resp SpO2  01/07/24 0821 103/68 98.7 F (37.1 C) -- 90 18 100 %  01/07/24 0533 94/67 98.1 F (36.7 C) Oral 90 -- 100 %  01/06/24 2041 110/77 98.8 F (37.1 C) Oral -- -- 100 %  01/06/24 1811  102/68 99.1 F (37.3 C) -- (!) 105 18 100 %    Awake and alert emaciated Chest b/l air entry Decreased bases [id number] Skin- erythematous rash over buttocks and thighs and legs- excoriations papular eruption all over her arms, trunk much improved Now just hyperpigemtaion           CNS grossly non focal   Lab Results    Latest Ref Rng & Units 01/02/2024    7:32 AM 12/31/2023    5:54 AM 12/27/2023    6:11 AM  CBC  WBC 4.0 - 10.5 K/uL 5.1  6.6  5.0   Hemoglobin 12.0 - 15.0 g/dL 8.3  8.1  8.7   Hematocrit 36.0 - 46.0 % 26.4  26.0  28.3   Platelets 150 - 400 K/uL 291  285  333        Latest Ref Rng & Units 01/02/2024    7:32 AM 12/31/2023    5:54 AM 12/27/2023    6:11 AM  CMP  Glucose 70 - 99 mg/dL 161  97  96   BUN 6 - 20 mg/dL 28  28  37   Creatinine 0.44 - 1.00  mg/dL 1.61  0.96  0.45   Sodium 135 - 145 mmol/L 139  138  140   Potassium 3.5 - 5.1 mmol/L 3.9  4.1  4.0   Chloride 98 - 111 mmol/L 106  105  106   CO2 22 - 32 mmol/L 23  26  25    Calcium 8.9 - 10.3 mg/dL 9.4  9.0  9.2   Total Protein 6.5 - 8.1 g/dL 8.6   7.9   Total Bilirubin 0.0 - 1.2 mg/dL 0.4   0.3   Alkaline Phos 38 - 126 U/L 53   61   AST 15 - 41 U/L 20   26   ALT 0 - 44 U/L 24   26       Microbiology: 12/28 BC NG  Toxo IgG > 400 Toxo PCR neg Toxo igM neg RPR NR CMV DNA neg Crypto neg HIV RNA 2 million>> 34, 000 Cd4 is 14 ( 2.4%) Beta D glucan > 500 (  repeated) Histoplasma neg Fungal antibodies pending Genosure prime- no resistant mutations     Assessment/Plan: Unresponsive /AMS secondary to cocaine use- resolved   Aspiration leading to acute hypoxic resp failure rt Middle lobe and lower lobe consolidation-- was intubated and mechanically ventilated in the beginning. Now extubated Was On zosyn Completed rx Repeat CXR clear   Rt sided l flank, chest pain-reoslved  CT abd ok- MRI spine deg changes- no acute findings  Rash- likely due to bactrim- changed to Atovaquone-  Mepron-resolved   H/o fall with recurrent ED presentation- had hurt left shoulder before     Pruritus with excoriations and hyperpigmentiaon Possibly due to aids, ? Bactrim aggravated it Much better Scabies was in the D.D but as so much improved with just moisturizer ,it is unlikely    AIDS- non compliant with meds or visits to her Provider- not been in care since 2021- used to [facility] before Was on Biktarvy at one time and was non compliant then VL now is 2 million and cd4 is 11  Started Biktarvy on 12/14/23 . not sure how much compliance to be expected as her social situation is not conducive and so is her substance use- watch closely for Immune reconstitution inflammatory syndrome Genotype no resistance to NRTI, NNRTI, Integrase inhibitor and PI Repeat Vl shows a significant drop in 3 weeks from 2 million to 34,000  RISK for IRIS- watch closely   Toxo IgG very high-  MRI brain no CNS lesions  . TOXO PCR negative On  PCP and MAI prophylaxis. 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

## 2024-01-07 NOTE — TOC Progression Note (Signed)
Transition of Care [facility] - Progression Note    Patient Details  Name: Meghan Jarvis MRN: [id number] Date of Birth: 1976-11-17  Transition of [facility] CM/SW Contact  Allena Katz, LCSW Phone Number: 01/07/2024, 4:14 PM  Clinical Narrative:   Psych order placed to reassess patients capacity.         Expected Discharge Plan and Services                                               Social Determinants of Health (SDOH) Interventions SDOH Screenings   Food Insecurity: Patient Unable To Answer (12/09/2023)  Recent Concern: Food Insecurity - Food Insecurity Present (09/26/2023)  Housing: Patient Unable To Answer (12/09/2023)  Recent Concern: Housing - Medium Risk (09/26/2023)  Transportation Needs: Patient Unable To Answer (12/09/2023)  Recent Concern: Transportation Needs - Unmet Transportation Needs (10/11/2023)  Utilities: Patient Unable To Answer (12/09/2023)  Recent Concern: Utilities - At Risk (09/25/2023)  Tobacco Use: High Risk (12/08/2023)    Readmission Risk Interventions     No data to display

## 2024-01-08 DIAGNOSIS — I959 Hypotension, unspecified: Secondary | ICD-10-CM | POA: Diagnosis not present

## 2024-01-08 DIAGNOSIS — B20 Human immunodeficiency virus [HIV] disease: Secondary | ICD-10-CM | POA: Diagnosis not present

## 2024-01-08 DIAGNOSIS — G9341 Metabolic encephalopathy: Secondary | ICD-10-CM | POA: Diagnosis not present

## 2024-01-08 DIAGNOSIS — J189 Pneumonia, unspecified organism: Secondary | ICD-10-CM | POA: Diagnosis not present

## 2024-01-08 LAB — FUNGUS CULTURE WITH STAIN

## 2024-01-08 LAB — FUNGAL ORGANISM REFLEX

## 2024-01-08 LAB — FUNGUS CULTURE RESULT

## 2024-01-08 MED ORDER — LACTULOSE 10 GM/15ML PO SOLN: 20.0000 g | Freq: Every day | ORAL | Status: DC | PRN

## 2024-01-08 MED ORDER — LACTULOSE 10 GM/15ML PO SOLN: 20.0000 g | Freq: Every day | ORAL | Status: DC

## 2024-01-08 MED ORDER — DIVALPROEX SODIUM ER 500 MG PO TB24
500.0000 mg | ORAL_TABLET | Freq: Every day | ORAL | Status: AC
  Administered 2024-01-08 – 2024-01-21 (×14): 500 mg via ORAL
  Filled 2024-01-08 (×14): qty 1

## 2024-01-08 NOTE — Progress Notes (Signed)
Nutrition Follow-up  DOCUMENTATION CODES:   Severe malnutrition in context of chronic illness  INTERVENTION:   -Continue Ensure Enlive po TID, each supplement provides 350 kcal and 20 grams of protein.  -Continue MVI with minerals daily -Continue 1 mg folic acid daily -Continue 914 mg thiamine daily -Continue Magic cup TID with meals, each supplement provides 290 kcal and 9 grams of protein   NUTRITION DIAGNOSIS:   Severe Malnutrition related to chronic illness (HIV, homelessness, substance abuse) as evidenced by percent weight loss, severe fat depletion, severe muscle depletion.  Ongoing  GOAL:   Patient will meet greater than or equal to 90% of their needs  Progressing   MONITOR:   PO intake, Supplement acceptance, Labs, Weight trends, I & O's, Skin  REASON FOR ASSESSMENT:   Consult Enteral/tube feeding initiation and management  ASSESSMENT:   48 y/o female with h/o homelessness, HIV, substance abuse, Afib, mood disorder, hypothyroidism, type 2 diabetes mellitus and depression who is admitted with aspiration PNA.  Reviewed I/O's: +240 ml x 24 hours and +3.3 L since 12/25/23   Pt with periods of agitation and refusing to answer questions.   Per TOC notes, psych has been re-consulted to determine decision making capacity. Per psych note on 12/24/23, pt lacks capacity to make healthcare decisions. Also following up with DSS.   Pt enjoys sweets and likes consuming [employer] and Ensure. Pt with good appetite. Noted meal completions 25-85%.   Per ID notes, pt likely has HIV associated neurocognitive disorder.   Wt has been stable over the past week.  Medications reviewed and include biktarvy, colace, lovenox, folic acid, protonix, and thiamine.   Labs reviewed: CBGS: 103 (inpatient orders for glycemic control are none).    Diet Order:   Diet Order             DIET DYS 3 Room service appropriate? Yes; Fluid consistency: Thin  Diet effective now                    EDUCATION NEEDS:   Not appropriate for education at this time  Skin:  Skin Assessment: Skin Integrity Issues: Skin Integrity Issues:: Other (Comment) Other: fissure wound to gluteal cleft, partial thickness rt lateral knee abrasion, excoriation to linear rt buttock  Last BM:  01/08/24 (type 6)  Height:   Ht Readings from Last 1 Encounters:  12/09/23 5' 7.99" (1.727 m)    Weight:   Wt Readings from Last 1 Encounters:  01/06/24 55 kg    Ideal Body Weight:  63.6 kg  BMI:  Body mass index is 18.44 kg/m.  Estimated Nutritional Needs:   Kcal:  1800-2100kcal/day  Protein:  90-105g/day  Fluid:  1.8-2.1L/day    Levada Schilling, RD, LDN, CDCES Registered Dietitian III Certified Diabetes Care and Education Specialist If unable to reach this RD, please use "RD Inpatient" group chat on secure chat between hours of 8am-4 pm daily

## 2024-01-08 NOTE — Progress Notes (Signed)
Physical Therapy Treatment Patient Details Name: Meghan Jarvis MRN: [id number] DOB: 11/15/1976 Today's Date: 01/08/2024   History of Present Illness Pt is a 48 year old female admitted with [facility] with acute hypoxic respiratory failure, severe sepsis, community acquired PNA acute metabolic encephalopathy; She was intubated, extubated 12/30    PMH significant for alcohol abuse, cocaine abuse, homelessness, AIDS, atrial fibrillation, hypothyroidism, type 2 diabetes mellitus    PT Comments  Pt seen standing at her door without RW or assistance.  Agreeable to ambulate in hallway with PT. Educated on continued use of RW, supervision for mobility tasks but intermittent minA for [employer]. Pt agreeable to sit up in the chair and get ready for a bath with staff, able to transfer with supervision as well. Pt demonstrated good progress towards mobility this session.   If plan is discharge home, recommend the following: Assistance with cooking/housework;Direct supervision/assist for financial management;Direct supervision/assist for medications management;Assist for transportation;Help with stairs or ramp for entrance;Supervision due to cognitive status;A little help with bathing/dressing/bathroom   Can travel by private vehicle     Yes  Equipment Recommendations  Rolling walker (2 wheels);BSC/3in1    Recommendations for Other Services       Precautions / Restrictions Precautions Precautions: Fall Restrictions Weight Bearing Restrictions Per Provider Order: No     Mobility  Bed Mobility                    Transfers Overall transfer level: Needs assistance     Sit to Stand: Supervision                Ambulation/Gait Ambulation/Gait assistance: Supervision, Min assist Gait Distance (Feet): 70 Feet Assistive device: Rolling walker (2 wheels)         General Gait Details: safety cues and RW assist but no physical assistance   Stairs             Wheelchair  Mobility     Tilt Bed    Modified Rankin (Stroke Patients Only)       Balance Overall balance assessment: Needs assistance   Sitting balance-Leahy Scale: Good       Standing balance-Leahy Scale: Good Standing balance comment: encouraged to use RW, and pt does reach for at least unilateral UE support without it                            Cognition Arousal: Alert Behavior During Therapy: WFL for tasks assessed/performed, Restless Overall Cognitive Status: No family/caregiver present to determine baseline cognitive functioning                                          Exercises      General Comments        Pertinent Vitals/Pain Pain Assessment Pain Assessment: No/denies pain    Home Living                          Prior Function            PT Goals (current goals can now be found in the care plan section) Progress towards PT goals: Progressing toward goals    Frequency    Min 1X/week      PT Plan      Co-evaluation  AM-PAC PT "6 Clicks" Mobility   Outcome Measure  Help needed turning from your back to your side while in a flat bed without using bedrails?: None Help needed moving from lying on your back to sitting on the side of a flat bed without using bedrails?: None Help needed moving to and from a bed to a chair (including a wheelchair)?: None Help needed standing up from a chair using your arms (e.g., wheelchair or bedside chair)?: None Help needed to walk in hospital room?: A Little Help needed climbing 3-5 steps with a railing? : A Little 6 Click Score: 22    End of Session   Activity Tolerance: Patient tolerated treatment well Patient left: in chair;with call bell/phone within reach Nurse Communication: Mobility status PT Visit Diagnosis: Other abnormalities of gait and mobility (R26.89);Difficulty in walking, not elsewhere classified (R26.2);Muscle weakness (generalized) (M62.81)      Time: 4401-0272 PT Time Calculation (min) (ACUTE ONLY): 8 min  Charges:    $Therapeutic Activity: 8-22 mins PT General Charges $$ ACUTE PT VISIT: 1 Visit                     Olga Coaster PT, DPT 3:04 PM,01/08/24

## 2024-01-08 NOTE — Progress Notes (Signed)
Progress Note   Patient: Meghan Jarvis ION:[id number] DOB: Jan 23, 1976 DOA: 12/08/2023     31 DOS: the patient was seen and examined on 01/08/2024   Brief hospital course: 48 year old female past medical history of alcohol abuse, cocaine abuse, homelessness, AIDS, atrial fibrillation, hypothyroidism, type 2 diabetes mellitus presented to [facility] with acute respiratory failure.  She was found down by a friend and could not wake her up.  She was found to be hypoxic in the 70s.  Since she was obtunded she was intubated and admitted by the critical care service.  He was started on antibiotics for pneumonia.  Urine toxicology positive for cocaine.  Patient extubated on 12/30.  12/12/2023.  Patient feeling okay.  Spoke with patient's friend [name] on the phone.  The patient is dating her son. 1/2.  Patient complained of chest pain.  Cardiac enzymes negative x 2 and EKG did not show any ST elevation. 1/3.  Patient complaining of abdominal pain.  Will get right upper quadrant ultrasound negative.  MRI of the brain did not show any toxoplasmosis lesions but did show advanced cerebral atrophy. 1/4.  Patient complaining of headache.  Will give IV magnesium.  Patient walked 3 feet with physical therapy. 1/5.  Blood pressure this morning was low and 2 fluid boluses given.  Started on midodrine. 1/6.  Patient complaining of a lot of abdominal pain.  CT scan does not show any acute process and shows better aeration of right lower lung.  CT did show constipation.  Will start MiraLAX. 1/7.  Patient feeling good today.  Did not complain of any pain.  Hemoglobin 9.1, platelet count up at 311, white blood count 4.1  1/15.  We do not have a TOC plan at this point of time.  TOC reached out to [facility].  Patient complains of right knee pain.  Uric acid 4.4.  X-ray negative. 1/16.  Patient feeling good and offers no complaints today. 1/17.  Patient complains of a slight headache. 1/18.  Patient complains of pain to  nursing staff but not to me. 1/19.  Patient in for Ace bandage for her right knee. 1/21.  I walked patient around the bed to the chair.  She used a walker.  Will give a trial of Flexeril since patient's pain in her right flank. 1/22: Little agitated due to difficult blood drawn. 1/23: Patient refusing to answer orientation questions and becoming agitated.  Looks like does not have capacity to make decision but not cooperative for any proper evaluation. 1/24: Giving some IV fluid and also started on low-dose metoprolol due to persistent mild tachycardia. 1/28: Psych reevaluated her, patient has capacity to make medical decision but need assistance with housing and finances due to cognitive decline.  Significant lab. QuantiFERON gold indeterminant on 1/15 Fungitell beta D glucan greater than 500 HIV RNA 34,300 (prior was 2 million) Toxoplasma gondii antibody IgG greater than 400, IgM less than 3 Fungus blood culture on 1/20 still pending Indeterminate results for coccidioides immitis antibodies  Assessment and Plan: * Acute metabolic encephalopathy Mental status improved from admission but patient is not the best historian and does not capacity to make decisions at this time.  DSS referral by TOC.  MRI did not show any enhancing lesions but did show severe cerebral atrophy.  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  Abdominal pain Patient intermittently complains of abdominal pain.  Prior imaging is negative.  Patient having bowel movements.  Taper pain medication.  Trial of Flexeril.  Hypotension  Continue midodrine to 2.5 mg 3 times daily.  AIDS (HCC) Viral load 2 million, CD4 14 on admission.  Infectious disease following.  Placed on PCP and MAI prophylaxis with atovaquone and Zithromax.  Toxoplasma antibody IgG came back elevated and MRI of the brain does not show any enhancing lesions.  Infectious disease specialist started on Biktarvy.  Repeat viral load down to 34,300.  Fungal  culture on 1/20 pending.  Headache Tylenol as needed.  IV magnesium earlier in hospital course.  Cryptococcus antigen negative.  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  Platelet platelet count up in the normal range at 285, white blood cell count up in the normal range at 6.6, hemoglobin 8.1.  Protein-calorie malnutrition, severe Continue supplements  Severe sepsis (HCC) Present on admission with tachycardia, tachypnea, fever, pneumonia, acute respiratory failure and acute metabolic encephalopathy.  Patient completed antibiotics.  Cocaine abuse (HCC) Must stop cocaine  Pneumonia of right lower lobe due to infectious organism Completed 5 days of antibiotic.  Acute hypoxic respiratory failure (HCC) Resolved.  Patient extubated on 12/30.  Patient currently breathing on room air.  Alcohol abuse Continue thiamine multivitamin and folic acid  Uncontrolled type 2 diabetes mellitus with hypoglycemia, without long-term current use of insulin (HCC) Hemoglobin A1c 6.0.   Generalized weakness Physical therapy recommending rehab  Chest pain Atypical in nature.  Troponin x 2 negative.  EKG did not show any ST elevations.  Repeat EKG on 1/17 did not show any ST elevations.  AKI (acute kidney injury) (HCC) Creatinine went up to 1.38 on 1/13.  Creatinine down to 0.75 on 1/20.  Right knee pain Patient asked for Ace bandage.  Right knee x-ray negative.  Uric acid 4.4.  Continue to monitor.  Underweight (BMI < 18.5) Last BMI 18.54  Diarrhea Likely overflow constipation causing her diarrhea.  Patient did not like lactulose or MiraLAX.  Continue Colace.      Subjective: Patient was sitting at the side of bed when seen today.  Per patient she does not have a good appetite and also does not like hospital food.  Physical Exam: Vitals:   01/07/24 1628 01/07/24 1816 01/07/24 2003 01/08/24 0542  BP: (!) 93/58 (!) 81/56 93/61 96/68   Pulse: (!) 106 (!) 103 (!) 107 87  Resp: 18  16 18   Temp:  99.4 F (37.4 C)  99.6 F (37.6 C) 97.8 F (36.6 C)  TempSrc:      SpO2: 100% 100% 100% 100%  Weight:      Height:       General.  Malnourished lady, in no acute distress. Pulmonary.  Lungs clear bilaterally, normal respiratory effort. CV.  Regular rate and rhythm, no JVD, rub or murmur. Abdomen.  Soft, nontender, nondistended, BS positive. CNS.  Alert and oriented .  No focal neurologic deficit. Extremities.  No edema, no cyanosis, pulses intact and symmetrical.  Data Reviewed: Prior data reviewed.  Disposition: Status is: Inpatient Remains inpatient appropriate because: DSS to look into disposition  Planned Discharge Destination: Unknown  DVT prophylaxis.  Lovenox Time spent: 39 minutes  Author: Arnetha Courser, MD 01/08/2024 3:52 PM  For on call review [email or web address].

## 2024-01-08 NOTE — Consult Note (Signed)
[facility] Follow-[facility]  Patient Name: .Meghan Jarvis  MRN: [id number]  DOB: [birth year]  Consult Order details:  Orders (From admission, onward)     Start     Ordered   01/07/24 1611  IP CONSULT TO PSYCHIATRY       Ordering Provider: Arnetha Courser, MD  Provider:  (Not yet assigned)  Question Answer Comment  Location [facility]   Reason for Consult? To evaluate for capacity to make medical decisions      01/07/24 1610   12/24/23 0739  IP CONSULT TO PSYCHIATRY       Ordering Provider: Debarah Crape, DO  Provider:  (Not yet assigned)  Question Answer Comment  Location [facility]   Reason for Consult? capacity assessment      12/24/23 0738             Mode of Visit: In person    Psychiatry Consult Evaluation  Service Date: January 08, 2024 LOS:  LOS: 31 days  Chief Complaint Capacity eval  Primary Psychiatric Diagnoses  Substance induced mood disorder 2.Adjustment disorder with depressed mood  Assessment  Meghan Jarvis is a 48 y.o. female admitted: Medicallyfor 12/08/2023  9:57 PM past medical history of alcohol abuse, cocaine abuse, homelessness, AIDS, atrial fibrillation, hypothyroidism, type 2 diabetes mellitus presented to [place] [facility] with acute respiratory failure. Patient has multiple medical complications, failure to thrive, pneumonia, ARF etc and remains hospitalized for safe discharge.Psychiatry was consulted for capacity eval. Patient is able to express her understanding of her medical problems including HIV, lung and heart problems, DM/HTN, her substance use hx and how it impacted her health. She is agreeing to all interventions at this time and wants the team to find a place and get her more resources. Capacity to make medical decisions can vary time to time especially in her case given the underlying neurocognitive disorder. Please consult [place] back if patient mental status fluctuates.     Diagnoses:  Active Hospital problems: Principal Problem:   Acute metabolic encephalopathy Active Problems:   Alcohol abuse   Acute hypoxic respiratory failure (HCC)   Pneumonia of right lower lobe due to infectious organism   Protein-calorie malnutrition, severe   Uncontrolled type 2 diabetes mellitus with hypoglycemia, without long-term current use of insulin (HCC)   Cocaine abuse (HCC)   AIDS (HCC)   Severe sepsis (HCC)   Chest pain   Pancytopenia (HCC)   Generalized weakness   Diarrhea   Right upper quadrant abdominal pain   Headache   Underweight (BMI < 18.5)   Hypotension   Abdominal pain   Encounter for psychiatric assessment   Rash due to allergy   Right knee pain   AKI (acute kidney injury) (HCC)    Plan   ## Psychiatric Medication Recommendations:  Continue Cymbalta Recommend to add Depakote ER 500mg  to help with mood stabilization  ## Medical Decision Making Capacity:  Capacity to make decisions on her medical care: Patient has no communication barriers with language or understanding.  Patient understands treatment and care options that are available to her, and no status to provider if she has any questions, able to logically explain the reason why she is in the hospital today, she reflects appreciation in her care due to being able to understand the reasoning of her being in the hospital, and able to state her personal opinion on the care.  Patient is able to express her own values and fax regarding the  care she is receiving.  She is able to communicate and express a choice clearly.    [facility] Cognitive assessment done- 12/20- Moderate cognitive decline Executive/visuospatial:2/5 Naming:3/3 Memory: Attention 2/6 Language 1/3 Abstraction:0/2 Delayed recall 0/5 Orientation 4/6  Given her underlying neurocognitive disorder patients capacity to make decisions can fluctuate and primary team can call us back if that occurs. ## Further Work-up:  None  recommended   ## Disposition:-- There are no psychiatric contraindications to discharge at this time  ## Behavioral / Environmental: -Difficult Patient (SELECT OPTIONS FROM BELOW) or Patient would benefit from more frequent contact with medical team to delineate plan of care and allow for clarification questions, which will help alleviate anxiety regarding treatment. If possible, try to check back in with the pt in the afternoon.    ## Safety and Observation Level:  - Based on my clinical evaluation, I estimate the patient to be at LOW risk of self harm in the current setting. - At this time, we recommend  routine. This decision is based on my review of the chart including patient's history and current presentation, interview of the patient, mental status examination, and consideration of suicide risk including evaluating suicidal ideation, plan, intent, suicidal or self-harm behaviors, risk factors, and protective factors. This judgment is based on our ability to directly address suicide risk, implement suicide prevention strategies, and develop a safety plan while the patient is in the clinical setting. Please contact our team if there is a concern that risk level has changed.  CSSR Risk Category:C-SSRS RISK CATEGORY: No Risk  Suicide Risk Assessment: Patient has following modifiable risk factors for suicide: lack of access to outpatient mental health resources, which we are addressing by providing resources. Patient has following non-modifiable or demographic risk factors for suicide: NA Patient has the following protective factors against suicide: Cultural, spiritual, or religious beliefs that discourage suicide  Thank you for this consult request. Recommendations have been communicated to the primary team.  We will sign off at this time.   Verner Chol, MD       History of Present Illness  Meghan Jarvis is a 49 y.o. female admitted: Medicallyfor 12/08/2023  9:57 PM past medical  history of alcohol abuse, cocaine abuse, homelessness, AIDS, atrial fibrillation, hypothyroidism, type 2 diabetes mellitus presented to [facility] with acute respiratory failure. She was found down by a friend and could not wake her up. She was found to be hypoxic in the 70s. Since she was obtunded she was intubated and admitted by the critical care service. Se was started on antibiotics for pneumonia. Urine toxicology positive for cocaine. Patient extubated on 12/30.  Psychiatry was consulted to evaluate capacity.  Patient was previously evaluated on 12/24/2023 and is deemed to have capacity to make medical decisions.  Today on interview, patient is noted to be retsing in bed.  She was able to tell her name date of birth and answered most of the orientation questions.  She got confused with the month and stated it is February.  When provider corrected her that it is January she acknowledged it.  She was able to tell that she had problems with drugs especially crack and she had acute respiratory failure that led up to the hospital admission.  She did acknowledge that she had multiple medical complications throughout her stay.  She reported that the drugs had affected her brain her heart and lungs.  She did acknowledge that she had some lung infection during his stay and had  antibiotics.  She was able to acknowledge history of HIV and the need for treatment.  She reports she was homeless for many years and she was not getting her medications for HIV or any other medical conditions including hypertension and diabetes.  She understands that if she continues to do drugs or stay on the streets her health will deteriorate.  She did acknowledge that the team is working on finding her a place to stay and get her some resources like home health.  Patient states that she talked to her sister yesterday and her sister also wants her to stay away from drugs.  Patient did say that she is looking forward to get discharged  from the hospital but understands that she cannot go back to be homeless. She reports feeling depressed because of her ongoing medical issues.  She reports that is why she is being irritable lately and refusing to talk to the team member sometimes as they asked the same questions every day.  Patient agreed to get onto a mood stabilizer like Seroquel or Depakote to help with her mood fluctuations.  Patient denies panic attacks.  She denies SI/HI/plan.  She denies auditory/visual hallucinations.  She denies any nightmares or flashbacks.  She is not displaying any overt delusions. Provider conducted [facility] cognitive assessment and it took more than 30 minutes to administer.  Initially patient was irritable but eventually understood that this assessment is to determine her decision-making capacity and her memory.  She took time but completed all the tasks.  She informed the provider that she has difficulty in remembering things.  She reports she used to get disability but she lost that.  And she wants to get back to her disability paycheck.   Psych ROS:  Depression: yes Anxiety:  denies Mania (lifetime and current): denies Psychosis: (lifetime and current): denies   Psychiatric and Social History  Psychiatric History:  nformation collected from Pt   Prev Dx/Sx: Substance use Current Psych Provider: Denies Home Meds (current): See Attending notes Family Psych History: Denies Family Hx suicide: Denies   Social History:    Living Situation: Homeless Spiritual Hx: Denies Access to weapons/lethal means: Denies    Substance History Alcohol: Endorses, 3 or more drinks a day  Tobacco: Denies Illicit drugs: Cocaine  Exam Findings  Physical Exam: Reviewed and agree with the medical examination of the medical provider Vital Signs:  Temp:  [97.8 F (36.6 C)-99.6 F (37.6 C)] 97.8 F (36.6 C) (01/28 0542) Pulse Rate:  [87-107] 87 (01/28 0542) Resp:  [16-18] 18 (01/28 0542) BP:  (81-96)/(56-68) 96/68 (01/28 0542) SpO2:  [100 %] 100 % (01/28 0542) Blood pressure 96/68, pulse 87, temperature 97.8 F (36.6 C), resp. rate 18, height 5' 7.99" (1.727 m), weight 55 kg, SpO2 100%. Body mass index is 18.44 kg/m.    Mental Status Exam: General Appearance: Casual  Orientation:  Other:  oriented to place, day, year but not month  Memory:  Immediate;   Fair Recent;   Fair Remote;   Poor  Concentration:  Concentration: Poor and Attention Span: Poor  Recall:  Poor  Attention  Poor  Eye Contact:  Fair  Speech:  Normal Rate  Language:  Fair  Volume:  Normal  Mood: fine  Affect:  Full Range  Thought Process:  Coherent  Thought Content:  Logical  Suicidal Thoughts:  No  Homicidal Thoughts:  No  Judgement:  Fair  Insight:  Shallow  Psychomotor Activity:  Normal  Akathisia:  No  Fund of Knowledge:  Fair      Assets:  Manufacturing systems engineer Desire for Improvement Resilience  Cognition:  Impaired,  Moderate  ADL's:  Intact  AIMS (if indicated):        Other History   These have been pulled in through the EMR, reviewed, and updated if appropriate.  Family History:  The patient's Family history is unknown by patient.  Medical History: Past Medical History:  Diagnosis Date   Asthma    Depression    HIV (human immunodeficiency virus infection) (HCC)     Surgical History: Past Surgical History:  Procedure Laterality Date   ABDOMINAL HYSTERECTOMY     APPENDECTOMY       Medications:   Current Facility-Administered Medications:    acetaminophen (TYLENOL) tablet 650 mg, 650 mg, Oral, Q6H PRN, Alford Highland, MD, 650 mg at 01/08/24 1329   atovaquone (MEPRON) 750 MG/5ML suspension 1,500 mg, 1,500 mg, Oral, Q breakfast, Ravishankar, Jayashree, MD, 1,500 mg at 01/08/24 1000   azithromycin (ZITHROMAX) tablet 1,200 mg, 1,200 mg, Oral, Weekly, Zeigler, Burtis Junes, RPH, 1,200 mg at 01/02/24 1352   bictegravir-emtricitabine-tenofovir AF (BIKTARVY) 50-200-25 MG per  tablet 1 tablet, 1 tablet, Oral, Daily, Ravishankar, Rhodia Albright, MD, 1 tablet at 01/08/24 1000   cyclobenzaprine (FLEXERIL) tablet 5 mg, 5 mg, Oral, TID PRN, Alford Highland, MD, 5 mg at 01/05/24 1835   DermaCerin CREA, , Topical, BID, Alford Highland, MD, 1 Application at 01/08/24 1001   diphenhydrAMINE (BENADRYL) capsule 25 mg, 25 mg, Oral, Q8H PRN, Dezii, Alexandra, DO, 25 mg at 01/06/24 2240   docusate sodium (COLACE) capsule 100 mg, 100 mg, Oral, BID, Renae Gloss, Richard, MD, 100 mg at 01/08/24 0959   DULoxetine (CYMBALTA) DR capsule 30 mg, 30 mg, Oral, QHS, Dezii, Alexandra, DO, 30 mg at 01/07/24 2326   enoxaparin (LOVENOX) injection 40 mg, 40 mg, Subcutaneous, QHS, Aleskerov, Fuad, MD, 40 mg at 01/07/24 2325   feeding supplement (ENSURE ENLIVE / ENSURE PLUS) liquid 237 mL, 237 mL, Oral, TID BM, Aleskerov, Fuad, MD, 237 mL at 01/08/24 1328   folic acid (FOLVITE) tablet 1 mg, 1 mg, Oral, Daily, Aleskerov, Fuad, MD, 1 mg at 01/08/24 0959   ipratropium-albuterol (DUONEB) 0.5-2.5 (3) MG/3ML nebulizer solution 3 mL, 3 mL, Nebulization, Q6H PRN, Wieting, Richard, MD   lactulose (CHRONULAC) 10 GM/15ML solution 20 g, 20 g, Oral, Daily PRN, Arnetha Courser, MD   lidocaine (LIDODERM) 5 % 1 patch, 1 patch, Transdermal, Q24H, Wieting, Richard, MD, 1 patch at 01/07/24 1617   metoprolol tartrate (LOPRESSOR) tablet 12.5 mg, 12.5 mg, Oral, BID, Amin, Tilman Neat, MD, 12.5 mg at 01/08/24 0959   midodrine (PROAMATINE) tablet 5 mg, 5 mg, Oral, TID WC, Amin, Tilman Neat, MD, 5 mg at 01/08/24 1328   multivitamin with minerals tablet 1 tablet, 1 tablet, Oral, QHS, Aleda Grana, RPH, 1 tablet at 01/07/24 2326   nicotine polacrilex (NICORETTE) gum 2 mg, 2 mg, Oral, PRN, Manuela Schwartz, NP   oxyCODONE (Oxy IR/ROXICODONE) immediate release tablet 2.5 mg, 2.5 mg, Oral, Q8H PRN, Renae Gloss, Richard, MD, 2.5 mg at 01/05/24 0625   pantoprazole (PROTONIX) EC tablet 40 mg, 40 mg, Oral, BID, Wieting, Richard, MD, 40 mg at 01/08/24  0959   polyethylene glycol (MIRALAX / GLYCOLAX) packet 17 g, 17 g, Oral, Daily PRN, Renae Gloss, Richard, MD, 17 g at 01/05/24 1002   thiamine (VITAMIN B1) tablet 100 mg, 100 mg, Oral, Daily, Karna Christmas, Fuad, MD, 100 mg at 01/08/24 0959  Allergies: Allergies  Allergen Reactions  Fish Allergy Other (See Comments)    Crab legs result in itching  Crab legs result in itching  Crab legs result in itching   Shellfish Allergy Other (See Comments)    Crab legs result in itching   Buprenorphine Hcl     Pt states she not allergic   Morphine And Codeine Itching    hives   Sulfa Antibiotics Rash    Verner Chol, MD

## 2024-01-08 NOTE — Plan of Care (Signed)
  Problem: Coping: Goal: Ability to adjust to condition or change in health will improve Outcome: Progressing   Problem: Fluid Volume: Goal: Ability to maintain a balanced intake and output will improve Outcome: Progressing   Problem: Health Behavior/Discharge Planning: Goal: Ability to identify and utilize available resources and services will improve Outcome: Progressing Goal: Ability to manage health-related needs will improve Outcome: Progressing   Problem: Metabolic: Goal: Ability to maintain appropriate glucose levels will improve Outcome: Progressing   Problem: Nutritional: Goal: Maintenance of adequate nutrition will improve Outcome: Progressing Goal: Progress toward achieving an optimal weight will improve Outcome: Progressing   Problem: Skin Integrity: Goal: Risk for impaired skin integrity will decrease Outcome: Progressing   Problem: Tissue Perfusion: Goal: Adequacy of tissue perfusion will improve Outcome: Progressing   Problem: Education: Goal: Knowledge of General Education information will improve Description: Including pain rating scale, medication(s)/side effects and non-pharmacologic comfort measures Outcome: Progressing   Problem: Clinical Measurements: Goal: Ability to maintain clinical measurements within normal limits will improve Outcome: Progressing Goal: Will remain free from infection Outcome: Progressing Goal: Diagnostic test results will improve Outcome: Progressing Goal: Respiratory complications will improve Outcome: Progressing Goal: Cardiovascular complication will be avoided Outcome: Progressing   Problem: Coping: Goal: Level of anxiety will decrease Outcome: Progressing   Problem: Elimination: Goal: Will not experience complications related to bowel motility Outcome: Progressing   Problem: Nutrition: Goal: Adequate nutrition will be maintained Outcome: Progressing

## 2024-01-09 DIAGNOSIS — R21 Rash and other nonspecific skin eruption: Secondary | ICD-10-CM | POA: Diagnosis not present

## 2024-01-09 DIAGNOSIS — T7840XA Allergy, unspecified, initial encounter: Secondary | ICD-10-CM | POA: Diagnosis not present

## 2024-01-09 DIAGNOSIS — G9341 Metabolic encephalopathy: Secondary | ICD-10-CM | POA: Diagnosis not present

## 2024-01-09 DIAGNOSIS — B20 Human immunodeficiency virus [HIV] disease: Secondary | ICD-10-CM | POA: Diagnosis not present

## 2024-01-09 LAB — COMPREHENSIVE METABOLIC PANEL
ALT: 24 U/L (ref 0–44)
AST: 25 U/L (ref 15–41)
Albumin: 3 g/dL — ABNORMAL LOW (ref 3.5–5.0)
Alkaline Phosphatase: 43 U/L (ref 38–126)
Anion gap: 8 (ref 5–15)
BUN: 19 mg/dL (ref 6–20)
CO2: 24 mmol/L (ref 22–32)
Calcium: 9.3 mg/dL (ref 8.9–10.3)
Chloride: 106 mmol/L (ref 98–111)
Creatinine, Ser: 0.63 mg/dL (ref 0.44–1.00)
GFR, Estimated: >60 mL/min (ref >60)
Glucose, Bld: 84 mg/dL (ref 70–99)
Potassium: 3.8 mmol/L (ref 3.5–5.1)
Sodium: 138 mmol/L (ref 135–145)
Total Bilirubin: 0.5 mg/dL (ref 0.0–1.2)
Total Protein: 8.1 g/dL (ref 6.5–8.1)

## 2024-01-09 LAB — CBC WITH DIFFERENTIAL/PLATELET
Abs Immature Granulocytes: 0.01 K/uL (ref 0.00–0.07)
Basophils Absolute: 0.1 K/uL (ref 0.0–0.1)
Basophils Relative: 1 %
Eosinophils Absolute: 0.8 K/uL — ABNORMAL HIGH (ref 0.0–0.5)
Eosinophils Relative: 16 %
HCT: 27.6 % — ABNORMAL LOW (ref 36.0–46.0)
Hemoglobin: 8.8 g/dL — ABNORMAL LOW (ref 12.0–15.0)
Immature Granulocytes: 0 %
Lymphocytes Relative: 42 %
Lymphs Abs: 2.2 K/uL (ref 0.7–4.0)
MCH: 27.4 pg (ref 26.0–34.0)
MCHC: 31.9 g/dL (ref 30.0–36.0)
MCV: 86 fL (ref 80.0–100.0)
Monocytes Absolute: 0.6 K/uL (ref 0.1–1.0)
Monocytes Relative: 11 %
Neutro Abs: 1.6 K/uL — ABNORMAL LOW (ref 1.7–7.7)
Neutrophils Relative %: 30 %
Platelets: 240 K/uL (ref 150–400)
RBC: 3.21 MIL/uL — ABNORMAL LOW (ref 3.87–5.11)
RDW: 15.9 % — ABNORMAL HIGH (ref 11.5–15.5)
Smear Review: NORMAL
WBC: 5.4 K/uL (ref 4.0–10.5)
nRBC: 0 % (ref 0.0–0.2)

## 2024-01-09 NOTE — TOC Progression Note (Signed)
Transition of Care [facility]) - Progression Note    Patient Details  Name: Meghan Jarvis MRN: [id number] Date of Birth: [birth year]  Transition of Care [facility]) CM/SW Contact  Allena Katz, LCSW Phone Number: 01/09/2024, 2:35 PM  Clinical Narrative Psych eval emailed to aniya per her request. She states she is on the fence on if pt needs guardianship or not.            Expected Discharge Plan and Services                                               Social Determinants of Health (SDOH) Interventions SDOH Screenings   Food Insecurity: Patient Unable To Answer (12/09/2023)  Recent Concern: Food Insecurity - Food Insecurity Present (09/26/2023)  Housing: Patient Unable To Answer (12/09/2023)  Recent Concern: Housing - Medium Risk (09/26/2023)  Transportation Needs: Patient Unable To Answer (12/09/2023)  Recent Concern: Transportation Needs - Unmet Transportation Needs (10/11/2023)  Utilities: Patient Unable To Answer (12/09/2023)  Recent Concern: Utilities - At Risk (09/25/2023)  Tobacco Use: High Risk (12/08/2023)    Readmission Risk Interventions     No data to display

## 2024-01-09 NOTE — Progress Notes (Incomplete)
   01/09/24 0434  Vitals  Temp 97.9 F (36.6 C)  Temp Source Oral  BP 96/65  MAP (mmHg) 76  BP Location Left Arm  BP Method Automatic  Patient Position (if appropriate) Lying  Pulse Rate 79  Pulse Rate Source Monitor  MEWS COLOR  MEWS Score Color Green  Oxygen Therapy  SpO2 99 %  O2 Device Room Air  MEWS Score  MEWS Temp 0  MEWS Systolic 1  MEWS Pulse 0  MEWS RR 0  MEWS LOC 0  MEWS Score 1   Reports right sided chest/abdominal pain, below breast, rates 9/10, sharp in nature, constant.  No other s/sx at this time.

## 2024-01-09 NOTE — Progress Notes (Signed)
Date of Admission:  12/08/2023     ID: Meghan Jarvis is a 48 y.o. female  Principal Problem:   Acute metabolic encephalopathy Active Problems:   Alcohol abuse   Acute hypoxic respiratory failure (HCC)   Pneumonia of right lower lobe due to infectious organism   Protein-calorie malnutrition, severe   Uncontrolled type 2 diabetes mellitus with hypoglycemia, without long-term current use of insulin (HCC)   Cocaine abuse (HCC)   AIDS (HCC)   Severe sepsis (HCC)   Chest pain   Pancytopenia (HCC)   Generalized weakness   Diarrhea   Right upper quadrant abdominal pain   Headache   Underweight (BMI < 18.5)   Hypotension   Abdominal pain   Encounter for psychiatric assessment   Rash due to allergy   Right knee pain   AKI (acute kidney injury) (HCC)  ? Meghan Jarvis is a 48 y.o. with a history of AIDS, not compliant with meds or visits ,  , cocaine use was brought in by EMS after being found unresponsive on the couch  in an aquaintance place-   Subjective: C/o rt sided lower chest and upper abdominal pain which has been intermittent for which she was investigated with 2 CT scans, MRI of spine and no acute pathology   Medications:   atovaquone  1,500 mg Oral Q breakfast   azithromycin  1,200 mg Oral Weekly   bictegravir-emtricitabine-tenofovir AF  1 tablet Oral Daily   DermaCerin   Topical BID   divalproex  500 mg Oral QHS   docusate sodium  100 mg Oral BID   DULoxetine  30 mg Oral QHS   enoxaparin (LOVENOX) injection  40 mg Subcutaneous QHS   feeding supplement  237 mL Oral TID BM   folic acid  1 mg Oral Daily   lidocaine  1 patch Transdermal Q24H   metoprolol tartrate  12.5 mg Oral BID   midodrine  5 mg Oral TID WC   multivitamin with minerals  1 tablet Oral QHS   pantoprazole  40 mg Oral BID   thiamine  100 mg Oral Daily    Objective: Vital signs in last 24 hours: Patient Vitals for the past 24 hrs:  BP Temp Temp src Pulse Resp SpO2  01/09/24 0831 106/76  98.2 F (36.8 C) -- 75 17 100 %  01/09/24 0550 104/66 98.2 F (36.8 C) Oral 74 17 99 %  01/09/24 0434 96/65 97.9 F (36.6 C) Oral 79 -- 99 %  01/08/24 2102 104/70 98.5 F (36.9 C) -- 74 -- 100 %  01/08/24 1637 (!) 94/56 98.1 F (36.7 C) -- 81 18 100 %    Awake and alert emaciated Chest b/l air entry Decreased bases [id number] Now just hyperpigemtaion           CNS grossly non focal   Lab Results    Latest Ref Rng & Units 01/02/2024    7:32 AM 12/31/2023    5:54 AM 12/27/2023    6:11 AM  CBC  WBC 4.0 - 10.5 K/uL 5.1  6.6  5.0   Hemoglobin 12.0 - 15.0 g/dL 8.3  8.1  8.7   Hematocrit 36.0 - 46.0 % 26.4  26.0  28.3   Platelets 150 - 400 K/uL 291  285  333        Latest Ref Rng & Units 01/02/2024    7:32 AM 12/31/2023    5:54 AM 12/27/2023    6:11 AM  CMP  Glucose  70 - 99 mg/dL 518  97  96   BUN 6 - 20 mg/dL 28  28  37   Creatinine 0.44 - 1.00 mg/dL 8.41  6.60  6.30   Sodium 135 - 145 mmol/L 139  138  140   Potassium 3.5 - 5.1 mmol/L 3.9  4.1  4.0   Chloride 98 - 111 mmol/L 106  105  106   CO2 22 - 32 mmol/L 23  26  25    Calcium 8.9 - 10.3 mg/dL 9.4  9.0  9.2   Total Protein 6.5 - 8.1 g/dL 8.6   7.9   Total Bilirubin 0.0 - 1.2 mg/dL 0.4   0.3   Alkaline Phos 38 - 126 U/L 53   61   AST 15 - 41 U/L 20   26   ALT 0 - 44 U/L 24   26       Microbiology: 12/28 BC NG  Toxo IgG > 400 Toxo PCR neg Toxo igM neg RPR NR CMV DNA neg Crypto neg HIV RNA 2 million>> 34, 000 Cd4 is 14 ( 2.4%) Beta D glucan > 500 (  repeated) Histoplasma neg Fungal antibodies pending Genosure prime- no resistant mutations     Assessment/Plan: Unresponsive /AMS secondary to cocaine use- resolved   Aspiration leading to acute hypoxic resp failure rt Middle lobe and lower lobe consolidation-- was intubated and mechanically ventilated in the beginning. Now extubated Was On zosyn Completed rx Repeat CXR clear   Rt sided l flank, chest pain-intermittent  CT abd ok- MRI spine deg  changes- no acute findings  Rash- resolved likely was due to bactrim- changed to Atovaquone- Mepron   H/o fall with recurrent ED presentation- had hurt left shoulder before     Pruritus with excoriations and hyperpigmentiaon Possibly due to aids, ? Bactrim aggravated it Much better Scabies was in the D.D but as so much improved with just moisturizer ,it is unlikely    AIDS- non compliant with meds or visits to her Provider- not been in care since 2021- [facility] to be [facility] before Was on Biktarvy at one time and was non compliant then VL now is 2 million and cd4 is 11  Started Biktarvy on 12/14/23 . not sure how much compliance to be expected as her social situation is not conducive and so is her substance use- watch closely for Immune reconstitution inflammatory syndrome Genotype no resistance to NRTI, NNRTI, Integrase inhibitor and PI Repeat Vl shows a significant drop in 3 weeks from 2 million to 34,000  RISK for IRIS- watch closely   Toxo IgG very high-  MRI brain no CNS lesions  . TOXO PCR negative On  PCP and MAI prophylaxis. 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

## 2024-01-09 NOTE — Plan of Care (Signed)
  Problem: Coping: Goal: Ability to adjust to condition or change in health will improve Outcome: Progressing   Problem: Fluid Volume: Goal: Ability to maintain a balanced intake and output will improve Outcome: Progressing   Problem: Health Behavior/Discharge Planning: Goal: Ability to manage health-related needs will improve Outcome: Progressing

## 2024-01-09 NOTE — Progress Notes (Signed)
Progress Note   Patient: Meghan Jarvis UJW:[id number] DOB: 12-28-1975 DOA: 12/08/2023     32 DOS: the patient was seen and examined on 01/09/2024     Brief hospital course: 48 year old female past medical history of alcohol abuse, cocaine abuse, homelessness, AIDS, atrial fibrillation, hypothyroidism, type 2 diabetes mellitus presented to [facility] with acute respiratory failure.  She was found down by a friend and could not wake her up.  She was found to be hypoxic in the 70s.  Since she was obtunded she was intubated and admitted by the critical care service.  He was started on antibiotics for pneumonia.  Urine toxicology positive for cocaine.  Patient extubated on 12/30.   12/12/2023.  Patient feeling okay.  Spoke with patient's friend [name] on the phone.  The patient is dating her son. 1/2.  Patient complained of chest pain.  Cardiac enzymes negative x 2 and EKG did not show any ST elevation. 1/3.  Patient complaining of abdominal pain.  Will get right upper quadrant ultrasound negative.  MRI of the brain did not show any toxoplasmosis lesions but did show advanced cerebral atrophy. 1/4.  Patient complaining of headache.  Will give IV magnesium.  Patient walked 3 feet with physical therapy. 1/5.  Blood pressure this morning was low and 2 fluid boluses given.  Started on midodrine. 1/6.  Patient complaining of a lot of abdominal pain.  CT scan does not show any acute process and shows better aeration of right lower lung.  CT did show constipation.  Will start MiraLAX. 1/7.  Patient feeling good today.  Did not complain of any pain.  Hemoglobin 9.1, platelet count up at 311, white blood count 4.1   1/15.  We do not have a TOC plan at this point of time.  TOC reached out to APS.  Patient complains of right knee pain.  Uric acid 4.4.  X-ray negative. 1/16.  Patient feeling good and offers no complaints today. 1/17.  Patient complains of a slight headache. 1/18.  Patient complains of  pain to nursing staff but not to me. 1/19.  Patient in for Ace bandage for her right knee. 1/21.  I walked patient around the bed to the chair.  She used a walker.  Will give a trial of Flexeril since patient's pain in her right flank. 1/22: Little agitated due to difficult blood drawn. 1/23: Patient refusing to answer orientation questions and becoming agitated.  Looks like does not have capacity to make decision but not cooperative for any proper evaluation. 1/24: Giving some IV fluid and also started on low-dose metoprolol due to persistent mild tachycardia. 1/28: Psych reevaluated her, patient has capacity to make medical decision but need assistance with housing and finances due to cognitive decline.   Significant lab. QuantiFERON gold indeterminant on 1/15 Fungitell beta D glucan greater than 500 HIV RNA 34,300 (prior was 2 million) Toxoplasma gondii antibody IgG greater than 400, IgM less than 3 Fungus blood culture on 1/20 still pending Indeterminate results for coccidioides immitis antibodies   Assessment and Plan: * Acute metabolic encephalopathy Mental status improved from admission but patient is not the best historian and does not capacity to make decisions at this time.  DSS referral by TOC.  MRI did not show any enhancing lesions but did show severe cerebral atrophy.  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 Mental status improving   Abdominal pain Patient intermittently complains of abdominal pain.  Prior imaging is negative.  Patient having bowel movements.  Taper pain medication.  Trial of Flexeril.   Hypotension Continue midodrine to 2.5 mg 3 times daily.   AIDS (HCC) Viral load 2 million, CD4 14 on admission.  Infectious disease following.  Placed on PCP and MAI prophylaxis with atovaquone and Zithromax.  Toxoplasma antibody IgG came back elevated and MRI of the brain does not show any enhancing lesions.  Infectious disease specialist started on Biktarvy.  Repeat  viral load down to 34,300.  Follow-up on fungal culture ID on board.  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  Platelet platelet count up in the normal range at 285, white blood cell count up in the normal range at 6.6, hemoglobin 8.1.   Protein-calorie malnutrition, severe Continue supplements   Severe sepsis (HCC) Present on admission with tachycardia, tachypnea, fever, pneumonia, acute respiratory failure and acute metabolic encephalopathy.  Patient completed antibiotics.   Cocaine abuse (HCC) Counseled on cessation   Pneumonia of right lower lobe due to infectious organism Completed 5 days of antibiotic.   Acute hypoxic respiratory failure (HCC) Resolved.  Patient extubated on 12/30.  Patient currently breathing on room air.   Alcohol abuse Continue thiamine multivitamin and folic acid   Uncontrolled type 2 diabetes mellitus with hypoglycemia, without long-term current use of insulin (HCC) Hemoglobin A1c 6.0.    Generalized weakness PT OT on board   Chest pain Atypical in nature.  Troponin x 2 negative.  EKG did not show any ST elevations.  Repeat EKG on 1/17 did not show any ST elevations.   AKI (acute kidney injury) (HCC) Continue monitoring Encouraged on adequate IV hydration   Right knee pain Patient asked for Ace bandage.  Right knee x-ray negative.  Uric acid 4.4.  Continue to monitor.   Underweight (BMI < 18.5) Last BMI 18.54   Diarrhea-resolved continue as needed Colace for any constipation   Subjective:   Patient seen and examined at bedside this morning She is still considering whether she will go to rehab or home TOC following Denies worsening nausea or vomiting  Disposition: Status is: Inpatient Remains inpatient appropriate because: DSS to look into disposition  Planned Discharge Destination: Unknown   DVT prophylaxis.  Lovenox   General.  Malnourished lady, in no acute distress. Pulmonary.  Lungs  clear bilaterally, normal respiratory effort. CV.  Regular rate and rhythm, no JVD, rub or murmur. Abdomen.  Soft, nontender, nondistended, BS positive. CNS.  Alert and oriented .  No focal neurologic deficit. Extremities.  No edema, no cyanosis, pulses intact and symmetrical.   Data Reviewed:     Latest Ref Rng & Units 01/09/2024   12:35 PM 01/02/2024    7:32 AM 12/31/2023    5:54 AM  CBC  WBC 4.0 - 10.5 K/uL 5.4  5.1  6.6   Hemoglobin 12.0 - 15.0 g/dL 8.8  8.3  8.1   Hematocrit 36.0 - 46.0 % 27.6  26.4  26.0   Platelets 150 - 400 K/uL 240  291  285        Latest Ref Rng & Units 01/09/2024   12:35 PM 01/02/2024    7:32 AM 12/31/2023    5:54 AM  BMP  Glucose 70 - 99 mg/dL 84  811  97   BUN 6 - 20 mg/dL 19  28  28    Creatinine 0.44 - 1.00 mg/dL 9.14  7.82  9.56   Sodium 135 - 145 mmol/L 138  139  138   Potassium 3.5 -  5.1 mmol/L 3.8  3.9  4.1   Chloride 98 - 111 mmol/L 106  106  105   CO2 22 - 32 mmol/L 24  23  26    Calcium 8.9 - 10.3 mg/dL 9.3  9.4  9.0     Vitals:   01/09/24 0434 01/09/24 0550 01/09/24 0831 01/09/24 1609  BP: 96/65 104/66 106/76 94/82  Pulse: 79 74 75   Resp:  17 17 18   Temp: 97.9 F (36.6 C) 98.2 F (36.8 C) 98.2 F (36.8 C) 98.3 F (36.8 C)  TempSrc: Oral Oral    SpO2: 99% 99% 100% 95%  Weight:      Height:         Author: Loyce Dys, MD 01/09/2024 6:35 PM  For on call review [email or web address].

## 2024-01-09 NOTE — Progress Notes (Signed)
EKG performed per Larkin Ina, NP, NSR result, oncall provider -Larkin Ina, NP updated. No further orders at this time. Pain decreased to 5/10, vitals stable, care ongoing.

## 2024-01-09 NOTE — Plan of Care (Signed)
Problem: Coping: Goal: Ability to adjust to condition or change in health will improve Outcome: Progressing   Problem: Fluid Volume: Goal: Ability to maintain a balanced intake and output will improve Outcome: Progressing   Problem: Health Behavior/Discharge Planning: Goal: Ability to identify and utilize available resources and services will improve Outcome: Progressing Goal: Ability to manage health-related needs will improve Outcome: Progressing   Problem: Metabolic: Goal: Ability to maintain appropriate glucose levels will improve Outcome: Progressing   Problem: Nutritional: Goal: Maintenance of adequate nutrition will improve Outcome: Progressing Goal: Progress toward achieving an optimal weight will improve Outcome: Progressing   Problem: Skin Integrity: Goal: Risk for impaired skin integrity will decrease Outcome: Progressing   Problem: Tissue Perfusion: Goal: Adequacy of tissue perfusion will improve Outcome: Progressing   Problem: Education: Goal: Knowledge of General Education information will improve Description: Including pain rating scale, medication(s)/side effects and non-pharmacologic comfort measures Outcome: Progressing   Problem: Health Behavior/Discharge Planning: Goal: Ability to manage health-related needs will improve Outcome: Progressing   Problem: Clinical Measurements: Goal: Ability to maintain clinical measurements within normal limits will improve Outcome: Progressing Goal: Will remain free from infection Outcome: Progressing Goal: Diagnostic test results will improve Outcome: Progressing Goal: Respiratory complications will improve Outcome: Progressing Goal: Cardiovascular complication will be avoided Outcome: Progressing   Problem: Activity: Goal: Risk for activity intolerance will decrease Outcome: Progressing   Problem: Nutrition: Goal: Adequate nutrition will be maintained Outcome: Progressing   Problem: Coping: Goal:  Level of anxiety will decrease Outcome: Progressing   Problem: Elimination: Goal: Will not experience complications related to bowel motility Outcome: Progressing Goal: Will not experience complications related to urinary retention Outcome: Progressing   Problem: Pain Management: Goal: General experience of comfort will improve Outcome: Progressing   Problem: Safety: Goal: Ability to remain free from injury will improve Outcome: Progressing   Problem: Skin Integrity: Goal: Risk for impaired skin integrity will decrease Outcome: Progressing   Problem: Activity: Goal: Ability to tolerate increased activity will improve Outcome: Progressing   Problem: Respiratory: Goal: Ability to maintain a clear airway and adequate ventilation will improve Outcome: Progressing

## 2024-01-09 NOTE — TOC Progression Note (Addendum)
Transition of Care [facility]) - Progression Note    Patient Details  Name: JACQUELYNE QUARRY MRN: [id number] Date of Birth: [birth year]  Transition of [facility]) CM/SW Contact  Allena Katz, LCSW Phone Number: 01/09/2024, 1:57 PM  Clinical Narrative:  CSW spoke with leslie at DSS who states aniya graham [phone] or [phone] is assigned to her. CSW has left her a VM.         Expected Discharge Plan and Services                                               Social Determinants of Health (SDOH) Interventions SDOH Screenings   Food Insecurity: Patient Unable To Answer (12/09/2023)  Recent Concern: Food Insecurity - Food Insecurity Present (09/26/2023)  Housing: Patient Unable To Answer (12/09/2023)  Recent Concern: Housing - Medium Risk (09/26/2023)  Transportation Needs: Patient Unable To Answer (12/09/2023)  Recent Concern: Transportation Needs - Unmet Transportation Needs (10/11/2023)  Utilities: Patient Unable To Answer (12/09/2023)  Recent Concern: Utilities - At Risk (09/25/2023)  Tobacco Use: High Risk (12/08/2023)    Readmission Risk Interventions     No data to display

## 2024-01-09 NOTE — Progress Notes (Addendum)
Occupational Therapy Treatment Patient Details Name: Meghan Jarvis MRN: [id number] DOB: [birth year] Today's Date: 01/09/2024   History of present illness Pt is a 48 year old female admitted with [facility] with acute hypoxic respiratory failure, severe sepsis, community acquired PNA acute metabolic encephalopathy; She was intubated, extubated 12/30    PMH significant for alcohol abuse, cocaine abuse, homelessness, AIDS, atrial fibrillation, hypothyroidism, type 2 diabetes mellitus   OT comments  Upon arrival, pt. was sitting up at the edge of her bed. Pt. was assisted with toileting skills. Pt. required CGA for transfers to, and from the BSCommode. Pt. requires CGA, for toileting care, and hand hygiene. Pt. requires cues for redirection, and safety. Pt. continues to benefit from OT services for ADL training, A/E training, and pt. education//caregiver education about cognitive compensatory strategies, about home modification, and DME.        If plan is discharge home, recommend the following:  Assistance with cooking/housework;Assistance with feeding;Direct supervision/assist for medications management;Direct supervision/assist for financial management;Assist for transportation;Help with stairs or ramp for entrance;Supervision due to cognitive status;A little help with walking and/or transfers;A little help with bathing/dressing/bathroom   Equipment Recommendations       Recommendations for Other Services      Precautions / Restrictions Precautions Precautions: Fall Restrictions Weight Bearing Restrictions Per Provider Order: No       Mobility Bed Mobility               General bed mobility comments: Independent with bed mobility    Transfers    CGA transfers bed to/from BSCommode                     Balance                                           ADL either performed or assessed with clinical judgement   ADL       Grooming: Wash/dry hands;Set  up;Contact guard assist;Standing                   Toilet Transfer: Contact guard assist   Toileting- Clothing Manipulation and Hygiene: Contact guard assist              Extremity/Trunk Assessment Upper Extremity Assessment Upper Extremity Assessment: Generalized weakness            Vision       Perception     Praxis      Cognition Arousal: Alert Behavior During Therapy: WFL for tasks assessed/performed, Restless Overall Cognitive Status: No family/caregiver present to determine baseline cognitive functioning Area of Impairment: Orientation, Attention, Memory, Following commands, Safety/judgement, Awareness, Problem solving                       Following Commands: Follows one step commands with increased time   Awareness: Intellectual Problem Solving: Slow processing, Decreased initiation, Difficulty sequencing, Requires verbal cues, Requires tactile cues          Exercises      Shoulder Instructions       General Comments      Pertinent Vitals/ Pain       Pain Assessment Pain Location: right side paiin-NR Pain Intervention(s): Limited activity within patient's tolerance, Monitored during session  Home Living  Prior Functioning/Environment              Frequency  Min 1X/week        Progress Toward Goals  OT Goals(current goals can now be found in the care plan section)  Progress towards OT goals: Progressing toward goals  Acute Rehab OT Goals Patient Stated Goal: To get better OT Goal Formulation: With patient Time For Goal Achievement: 01/18/24 Potential to Achieve Goals: Fair  Plan      Co-evaluation                 AM-PAC OT "6 Clicks" Daily Activity     Outcome Measure   Help from another person eating meals?: None Help from another person taking care of personal grooming?: A Little Help from another person toileting, which includes using  toliet, bedpan, or urinal?: A Little Help from another person bathing (including washing, rinsing, drying)?: A Little Help from another person to put on and taking off regular upper body clothing?: A Little Help from another person to put on and taking off regular lower body clothing?: A Little 6 Click Score: 19    End of Session Equipment Utilized During Treatment: Rolling walker (2 wheels)  OT Visit Diagnosis: Other abnormalities of gait and mobility (R26.89);Muscle weakness (generalized) (M62.81)   Activity Tolerance Patient tolerated treatment well   Patient Left in bed;with call bell/phone within reach;with bed alarm set   Nurse Communication          Time: 1191-4782 OT Time Calculation (min): 12 min  Charges: OT General Charges $OT Visit: 1 Visit OT Treatments $Self Care/Home Management : 8-22 mins  Olegario Messier, MS, OTR/L   Olegario Messier 01/09/2024, 2:50 PM

## 2024-01-10 ENCOUNTER — Other Ambulatory Visit: Payer: Self-pay

## 2024-01-10 DIAGNOSIS — B20 Human immunodeficiency virus [HIV] disease: Secondary | ICD-10-CM | POA: Diagnosis not present

## 2024-01-10 DIAGNOSIS — G9341 Metabolic encephalopathy: Secondary | ICD-10-CM | POA: Diagnosis not present

## 2024-01-10 DIAGNOSIS — F1410 Cocaine abuse, uncomplicated: Secondary | ICD-10-CM | POA: Diagnosis not present

## 2024-01-10 DIAGNOSIS — F1010 Alcohol abuse, uncomplicated: Secondary | ICD-10-CM | POA: Diagnosis not present

## 2024-01-10 LAB — CBC WITH DIFFERENTIAL/PLATELET
Abs Immature Granulocytes: 0.01 10*3/uL (ref 0.00–0.07)
Basophils Absolute: 0.1 10*3/uL (ref 0.0–0.1)
Basophils Relative: 1 %
Eosinophils Absolute: 0.8 10*3/uL — ABNORMAL HIGH (ref 0.0–0.5)
Eosinophils Relative: 15 %
HCT: 25.9 % — ABNORMAL LOW (ref 36.0–46.0)
Hemoglobin: 8.4 g/dL — ABNORMAL LOW (ref 12.0–15.0)
Immature Granulocytes: 0 %
Lymphocytes Relative: 41 %
Lymphs Abs: 2.1 10*3/uL (ref 0.7–4.0)
MCH: 27.5 pg (ref 26.0–34.0)
MCHC: 32.4 g/dL (ref 30.0–36.0)
MCV: 84.9 fL (ref 80.0–100.0)
Monocytes Absolute: 0.5 10*3/uL (ref 0.1–1.0)
Monocytes Relative: 9 %
Neutro Abs: 1.8 10*3/uL (ref 1.7–7.7)
Neutrophils Relative %: 34 %
Platelets: 259 10*3/uL (ref 150–400)
RBC: 3.05 MIL/uL — ABNORMAL LOW (ref 3.87–5.11)
RDW: 16.1 % — ABNORMAL HIGH (ref 11.5–15.5)
Smear Review: NORMAL
WBC: 5.2 10*3/uL (ref 4.0–10.5)
nRBC: 0 % (ref 0.0–0.2)

## 2024-01-10 LAB — HELPER T-LYMPH-CD4 (ARMC ONLY)
% CD 4 Pos. Lymph.: 23.1 % — ABNORMAL LOW (ref 30.8–58.5)
Absolute CD 4 Helper: 416 /uL (ref 359–1519)
Basophils Absolute: 0.1 x10E3/uL (ref 0.0–0.2)
Basos: 2 %
EOS (ABSOLUTE): 0.8 x10E3/uL — ABNORMAL HIGH (ref 0.0–0.4)
Eos: 17 %
Hematocrit: 27.9 % — ABNORMAL LOW (ref 34.0–46.6)
Hemoglobin: 8.9 g/dL — ABNORMAL LOW (ref 11.1–15.9)
Immature Grans (Abs): 0 x10E3/uL (ref 0.0–0.1)
Immature Granulocytes: 0 %
Lymphocytes Absolute: 1.8 x10E3/uL (ref 0.7–3.1)
Lymphs: 38 %
MCH: 27.1 pg (ref 26.6–33.0)
MCHC: 31.9 g/dL (ref 31.5–35.7)
MCV: 85 fL (ref 79–97)
Monocytes Absolute: 0.6 x10E3/uL (ref 0.1–0.9)
Monocytes: 12 %
Neutrophils Absolute: 1.4 x10E3/uL (ref 1.4–7.0)
Neutrophils: 31 %
Platelets: 257 x10E3/uL (ref 150–450)
RBC: 3.28 x10E6/uL — ABNORMAL LOW (ref 3.77–5.28)
RDW: 14.6 % (ref 11.7–15.4)
WBC: 4.7 x10E3/uL (ref 3.4–10.8)

## 2024-01-10 LAB — BASIC METABOLIC PANEL
Anion gap: 9 (ref 5–15)
BUN: 25 mg/dL — ABNORMAL HIGH (ref 6–20)
CO2: 25 mmol/L (ref 22–32)
Calcium: 9.2 mg/dL (ref 8.9–10.3)
Chloride: 106 mmol/L (ref 98–111)
Creatinine, Ser: 0.7 mg/dL (ref 0.44–1.00)
GFR, Estimated: >60 mL/min (ref >60)
Glucose, Bld: 99 mg/dL (ref 70–99)
Potassium: 3.9 mmol/L (ref 3.5–5.1)
Sodium: 140 mmol/L (ref 135–145)

## 2024-01-10 NOTE — TOC Progression Note (Signed)
Transition of Care [facility]) - Progression Note    Patient Details  Name: Meghan Jarvis MRN: [id number] Date of Birth: [birth year]  Transition of Care [facility]) CM/SW Contact  Allena Katz, LCSW Phone Number: 01/10/2024, 2:53 PM  Clinical Narrative:   MD spoke with pt MD from insurance. Insurance working on finding patient an adult care facilty and asked that we keep her for 1 or two more days. TOC following.         Expected Discharge Plan and Services         Expected Discharge Date: 01/10/24                                     Social Determinants of Health (SDOH) Interventions SDOH Screenings   Food Insecurity: Patient Unable To Answer (12/09/2023)  Recent Concern: Food Insecurity - Food Insecurity Present (09/26/2023)  Housing: Patient Unable To Answer (12/09/2023)  Recent Concern: Housing - Medium Risk (09/26/2023)  Transportation Needs: Patient Unable To Answer (12/09/2023)  Recent Concern: Transportation Needs - Unmet Transportation Needs (10/11/2023)  Utilities: Patient Unable To Answer (12/09/2023)  Recent Concern: Utilities - At Risk (09/25/2023)  Tobacco Use: High Risk (12/08/2023)    Readmission Risk Interventions     No data to display

## 2024-01-10 NOTE — TOC Progression Note (Addendum)
Transition of Care [facility]) - Progression Note    Patient Details  Name: Meghan Jarvis MRN: [id number] Date of Birth: 06-17-1976  Transition of Care [facility]) CM/SW Contact  Allena Katz, LCSW Phone Number: 01/10/2024, 1:44 PM  Clinical Narrative:   Gerlean Ren at [employer] pt is not welcome back. CSW spoke with patients about shelters such as [place] [facility]. Pt is agreeable to this and understands its not a guarantee of acceptance. CSW spoke with her about getting a walker pt reports she does not need a walker and has declined. Case staffed with Zack brooks.         Expected Discharge Plan and Services         Expected Discharge Date: 01/10/24                                     Social Determinants of Health (SDOH) Interventions SDOH Screenings   Food Insecurity: Patient Unable To Answer (12/09/2023)  Recent Concern: Food Insecurity - Food Insecurity Present (09/26/2023)  Housing: Patient Unable To Answer (12/09/2023)  Recent Concern: Housing - Medium Risk (09/26/2023)  Transportation Needs: Patient Unable To Answer (12/09/2023)  Recent Concern: Transportation Needs - Unmet Transportation Needs (10/11/2023)  Utilities: Patient Unable To Answer (12/09/2023)  Recent Concern: Utilities - At Risk (09/25/2023)  Tobacco Use: High Risk (12/08/2023)    Readmission Risk Interventions     No data to display

## 2024-01-10 NOTE — TOC Progression Note (Signed)
Transition of Care [facility]) - Progression Note    Patient Details  Name: Meghan Jarvis MRN: [id number] Date of Birth: 07-04-1976  Transition of Care [facility]) CM/SW Contact  Allena Katz, LCSW Phone Number: 01/10/2024, 2:30 PM  Clinical Narrative:  CSW received a call from [employer] stating they want our MD to call them before she is discharged as they do not feel the discharge is safe. [phone] Dr Thurmond Butts with          Expected Discharge Plan and Services         Expected Discharge Date: 01/10/24                                     Social Determinants of Health (SDOH) Interventions SDOH Screenings   Food Insecurity: Patient Unable To Answer (12/09/2023)  Recent Concern: Food Insecurity - Food Insecurity Present (09/26/2023)  Housing: Patient Unable To Answer (12/09/2023)  Recent Concern: Housing - Medium Risk (09/26/2023)  Transportation Needs: Patient Unable To Answer (12/09/2023)  Recent Concern: Transportation Needs - Unmet Transportation Needs (10/11/2023)  Utilities: Patient Unable To Answer (12/09/2023)  Recent Concern: Utilities - At Risk (09/25/2023)  Tobacco Use: High Risk (12/08/2023)    Readmission Risk Interventions     No data to display

## 2024-01-10 NOTE — Progress Notes (Signed)
MD requesting discharge for today. Patient's [employer] care manager Domenick Gong e-mailed with update. Ms. Chalmers Guest to call Darrian with [facility] regarding any potential housing options.   Per Karie Soda, at [employer], patient is "not welcome" there.

## 2024-01-10 NOTE — Plan of Care (Signed)
  Problem: Coping: Goal: Ability to adjust to condition or change in health will improve Outcome: Progressing   Problem: Fluid Volume: Goal: Ability to maintain a balanced intake and output will improve Outcome: Progressing   Problem: Health Behavior/Discharge Planning: Goal: Ability to identify and utilize available resources and services will improve Outcome: Progressing Goal: Ability to manage health-related needs will improve Outcome: Progressing   Problem: Metabolic: Goal: Ability to maintain appropriate glucose levels will improve Outcome: Progressing   Problem: Nutritional: Goal: Maintenance of adequate nutrition will improve Outcome: Progressing Goal: Progress toward achieving an optimal weight will improve Outcome: Progressing   Problem: Skin Integrity: Goal: Risk for impaired skin integrity will decrease Outcome: Progressing   Problem: Tissue Perfusion: Goal: Adequacy of tissue perfusion will improve Outcome: Progressing   Problem: Education: Goal: Knowledge of General Education information will improve Description: Including pain rating scale, medication(s)/side effects and non-pharmacologic comfort measures Outcome: Progressing   Problem: Health Behavior/Discharge Planning: Goal: Ability to manage health-related needs will improve Outcome: Progressing   Problem: Clinical Measurements: Goal: Ability to maintain clinical measurements within normal limits will improve Outcome: Progressing Goal: Will remain free from infection Outcome: Progressing Goal: Diagnostic test results will improve Outcome: Progressing Goal: Respiratory complications will improve Outcome: Progressing Goal: Cardiovascular complication will be avoided Outcome: Progressing   Problem: Activity: Goal: Risk for activity intolerance will decrease Outcome: Progressing   Problem: Nutrition: Goal: Adequate nutrition will be maintained Outcome: Progressing   Problem: Coping: Goal:  Level of anxiety will decrease Outcome: Progressing   Problem: Elimination: Goal: Will not experience complications related to bowel motility Outcome: Progressing Goal: Will not experience complications related to urinary retention Outcome: Progressing   Problem: Pain Management: Goal: General experience of comfort will improve Outcome: Progressing   Problem: Safety: Goal: Ability to remain free from injury will improve Outcome: Progressing   Problem: Skin Integrity: Goal: Risk for impaired skin integrity will decrease Outcome: Progressing   Problem: Activity: Goal: Ability to tolerate increased activity will improve Outcome: Progressing   Problem: Respiratory: Goal: Ability to maintain a clear airway and adequate ventilation will improve Outcome: Progressing

## 2024-01-10 NOTE — Progress Notes (Signed)
Progress Note   Patient: Meghan Jarvis XBJ:[id number] DOB: 01-29-1976 DOA: 12/08/2023     33 DOS: the patient was seen and examined on 01/10/2024     Brief hospital course: 48 year old female past medical history of alcohol abuse, cocaine abuse, homelessness, AIDS, atrial fibrillation, hypothyroidism, type 2 diabetes mellitus presented to [facility] with acute respiratory failure.  She was found down by a friend and could not wake her up.  She was found to be hypoxic in the 70s.  Since she was obtunded she was intubated and admitted by the critical care service.  He was started on antibiotics for pneumonia.  Urine toxicology positive for cocaine.  Patient extubated on 12/30.   Significant lab. QuantiFERON gold indeterminant on 1/15 Fungitell beta D glucan greater than 500 HIV RNA 34,300 (prior was 2 million) Toxoplasma gondii antibody IgG greater than 400, IgM less than 3 Fungus blood culture on 1/20 still pending Indeterminate results for coccidioides immitis antibodies   Assessment and Plan: * Acute metabolic encephalopathy Mental status improved from admission but patient is not the best historian and does not capacity to make decisions at this time.  DSS referral by TOC.  MRI did not show any enhancing lesions but did show severe cerebral atrophy.  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 Mental status improving Have been seen by psychiatry and found to have capacity to make her own decision    abdominal pain Patient intermittently complains of abdominal pain.  Prior imaging is negative.  Patient having bowel movements.  Taper pain medication.   Continue Flexeril   Hypotension Continue midodrine to 2.5 mg 3 times daily.   AIDS (HCC) Viral load 2 million, CD4 14 on admission.  Infectious disease following.  Placed on PCP and MAI prophylaxis with atovaquone and Zithromax.  Toxoplasma antibody IgG came back elevated and MRI of the brain does not show any enhancing  lesions.  Infectious disease specialist started on Biktarvy.  Repeat viral load down to 34,300.  Follow-up on fungal culture ID on board.  Appreciate input I have discussed the case with infectious disease today   Headache-improved   Pancytopenia (HCC)-improved Secondary to HIV.  Platelet platelet count up in the normal range at 285, white blood cell count up in the normal range at 6.6, hemoglobin 8.1.   Protein-calorie malnutrition, severe Continue supplements   Severe sepsis (HCC) Present on admission with tachycardia, tachypnea, fever, pneumonia, acute respiratory failure and acute metabolic encephalopathy.  Patient completed antibiotics.   Cocaine abuse (HCC) Counseled on cessation   Pneumonia of right lower lobe due to infectious organism Completed 5 days of antibiotic.   Acute hypoxic respiratory failure (HCC) Resolved.  Patient extubated on 12/30.  Patient currently breathing on room air.   Alcohol abuse Continue thiamine multivitamin and folic acid   Uncontrolled type 2 diabetes mellitus with hypoglycemia, without long-term current use of insulin (HCC) Hemoglobin A1c 6.0.    Generalized weakness PT OT on board   Chest pain Atypical in nature.  Troponin x 2 negative.  EKG did not show any ST elevations.  Repeat EKG on 1/17 did not show any ST elevations.   AKI (acute kidney injury) (HCC) Continue monitoring Encouraged on adequate IV hydration   Right knee pain Patient asked for Ace bandage.  Right knee x-ray negative.  Uric acid 4.4.  Continue to monitor.   Underweight (BMI < 18.5) Last BMI 18.54   Diarrhea-resolved continue as needed Colace for any constipation   Subjective:  Patient seen  and examined at bedside this morning Patient tells me she wants to be discharged home PT evaluated patient and recommended home health Have been seen by psychiatry and deemed to have capacity to make her own decision DSS following  Disposition: Status is:  Inpatient Remains inpatient appropriate because: DSS to look into disposition  Planned Discharge Destination: Unknown   DVT prophylaxis.  Lovenox     General.  Malnourished lady, in no acute distress. Pulmonary.  Lungs clear bilaterally, normal respiratory effort. CV.  Regular rate and rhythm, no JVD, rub or murmur. Abdomen.  Soft, nontender, nondistended, BS positive. CNS.  Alert and oriented .  No focal neurologic deficit. Extremities.  No edema, no cyanosis, pulses intact and symmetrical.   Data Reviewed:  Vitals:   01/09/24 2045 01/10/24 0609 01/10/24 0805 01/10/24 1655  BP: 104/74 101/76 110/78 (!) 115/98  Pulse: 88 84 78 81  Resp:   18 16  Temp: 98.3 F (36.8 C) 98 F (36.7 C) 98.5 F (36.9 C) 98.4 F (36.9 C)  TempSrc:      SpO2: 100% 100% 100% 100%  Weight:      Height:        Author: Loyce Dys, MD 01/10/2024 6:43 PM  For on call review [email or web address].

## 2024-01-10 NOTE — Plan of Care (Signed)
  Problem: Coping: Goal: Ability to adjust to condition or change in health will improve Outcome: Progressing   Problem: Fluid Volume: Goal: Ability to maintain a balanced intake and output will improve Outcome: Progressing   Problem: Health Behavior/Discharge Planning: Goal: Ability to identify and utilize available resources and services will improve Outcome: Progressing Goal: Ability to manage health-related needs will improve Outcome: Progressing   Problem: Metabolic: Goal: Ability to maintain appropriate glucose levels will improve Outcome: Progressing   Problem: Nutritional: Goal: Maintenance of adequate nutrition will improve Outcome: Progressing Goal: Progress toward achieving an optimal weight will improve Outcome: Progressing   Problem: Skin Integrity: Goal: Risk for impaired skin integrity will decrease Outcome: Progressing   Problem: Tissue Perfusion: Goal: Adequacy of tissue perfusion will improve Outcome: Progressing   Problem: Education: Goal: Knowledge of General Education information will improve Description: Including pain rating scale, medication(s)/side effects and non-pharmacologic comfort measures Outcome: Progressing   Problem: Health Behavior/Discharge Planning: Goal: Ability to manage health-related needs will improve Outcome: Progressing   Problem: Clinical Measurements: Goal: Ability to maintain clinical measurements within normal limits will improve Outcome: Progressing Goal: Will remain free from infection Outcome: Progressing Goal: Diagnostic test results will improve Outcome: Progressing Goal: Respiratory complications will improve Outcome: Progressing Goal: Cardiovascular complication will be avoided Outcome: Progressing   Problem: Activity: Goal: Risk for activity intolerance will decrease Outcome: Progressing   Problem: Nutrition: Goal: Adequate nutrition will be maintained Outcome: Progressing   Problem: Coping: Goal:  Level of anxiety will decrease Outcome: Progressing   Problem: Elimination: Goal: Will not experience complications related to bowel motility Outcome: Progressing Goal: Will not experience complications related to urinary retention Outcome: Progressing   Problem: Pain Management: Goal: General experience of comfort will improve Outcome: Progressing   Problem: Skin Integrity: Goal: Risk for impaired skin integrity will decrease Outcome: Progressing   Problem: Activity: Goal: Ability to tolerate increased activity will improve Outcome: Progressing

## 2024-01-10 NOTE — Progress Notes (Signed)
Date of Admission:  12/08/2023     ID: Meghan Jarvis is a 48 y.o. female  Principal Problem:   Acute metabolic encephalopathy Active Problems:   Alcohol abuse   Acute hypoxic respiratory failure (HCC)   Pneumonia of right lower lobe due to infectious organism   Protein-calorie malnutrition, severe   Uncontrolled type 2 diabetes mellitus with hypoglycemia, without long-term current use of insulin (HCC)   Cocaine abuse (HCC)   AIDS (HCC)   Severe sepsis (HCC)   Chest pain   Pancytopenia (HCC)   Generalized weakness   Diarrhea   Right upper quadrant abdominal pain   Headache   Underweight (BMI < 18.5)   Hypotension   Abdominal pain   Encounter for psychiatric assessment   Rash due to allergy   Right knee pain   AKI (acute kidney injury) (HCC)  ? Meghan Jarvis is a 48 y.o. with a history of AIDS, not compliant with meds or visits ,  , cocaine use was brought in by EMS after being found unresponsive on the couch  in an aquaintance place-   Subjective: Pt in bed Friend at bed side   Medications:   atovaquone  1,500 mg Oral Q breakfast   azithromycin  1,200 mg Oral Weekly   bictegravir-emtricitabine-tenofovir AF  1 tablet Oral Daily   DermaCerin   Topical BID   divalproex  500 mg Oral QHS   docusate sodium  100 mg Oral BID   DULoxetine  30 mg Oral QHS   enoxaparin (LOVENOX) injection  40 mg Subcutaneous QHS   feeding supplement  237 mL Oral TID BM   folic acid  1 mg Oral Daily   lidocaine  1 patch Transdermal Q24H   metoprolol tartrate  12.5 mg Oral BID   midodrine  5 mg Oral TID WC   multivitamin with minerals  1 tablet Oral QHS   pantoprazole  40 mg Oral BID   thiamine  100 mg Oral Daily    Objective: Vital signs in last 24 hours: Patient Vitals for the past 24 hrs:  BP Temp Pulse Resp SpO2  01/10/24 0805 110/78 98.5 F (36.9 C) 78 18 100 %  01/10/24 0609 101/76 98 F (36.7 C) 84 -- 100 %  01/09/24 2045 104/74 98.3 F (36.8 C) 88 -- 100 %  01/09/24  1609 94/82 98.3 F (36.8 C) -- 18 95 %    Awake and alert emaciated Chest b/l air entry Decreased bases [id number] Now just hyperpigemtaion           CNS grossly non focal   Lab Results    Latest Ref Rng & Units 01/10/2024    4:51 AM 01/09/2024   12:35 PM 01/02/2024    7:32 AM  CBC  WBC 4.0 - 10.5 K/uL 5.2  5.4    4.7  5.1   Hemoglobin 12.0 - 15.0 g/dL 8.4  8.8    8.9  8.3   Hematocrit 36.0 - 46.0 % 25.9  27.6    27.9  26.4   Platelets 150 - 400 K/uL 259  240    257  291        Latest Ref Rng & Units 01/10/2024    4:51 AM 01/09/2024   12:35 PM 01/02/2024    7:32 AM  CMP  Glucose 70 - 99 mg/dL 99  84  657   BUN 6 - 20 mg/dL 25  19  28    Creatinine 0.44 - 1.00 mg/dL  0.70  0.63  0.73   Sodium 135 - 145 mmol/L 140  138  139   Potassium 3.5 - 5.1 mmol/L 3.9  3.8  3.9   Chloride 98 - 111 mmol/L 106  106  106   CO2 22 - 32 mmol/L 25  24  23    Calcium 8.9 - 10.3 mg/dL 9.2  9.3  9.4   Total Protein 6.5 - 8.1 g/dL  8.1  8.6   Total Bilirubin 0.0 - 1.2 mg/dL  0.5  0.4   Alkaline Phos 38 - 126 U/L  43  53   AST 15 - 41 U/L  25  20   ALT 0 - 44 U/L  24  24       Microbiology: 12/28 BC NG  Toxo IgG > 400 Toxo PCR neg Toxo igM neg RPR NR CMV DNA neg Crypto neg HIV RNA 2 million>> 34, 000 Cd4 is 14 ( 2.4%) Beta D glucan > 500 (  repeated) Histoplasma neg Fungal antibodies pending Genosure prime- no resistant mutations     Assessment/Plan: Unresponsive /AMS secondary to cocaine use- resolved   Aspiration leading to acute hypoxic resp failure rt Middle lobe and lower lobe consolidation-- was intubated and mechanically ventilated in the beginning. Now extubated Was On zosyn Completed rx Repeat CXR clear   Rt sided l flank, chest pain-intermittent  CT abd ok- MRI spine deg changes- no acute findings  Rash- resolved likely was due to bactrim- changed to Atovaquone- Mepron   H/o fall with recurrent ED presentation- had hurt left shoulder before      Pruritus with excoriations and hyperpigmentiaon Possibly due to aids, ? Bactrim aggravated it Much better Scabies was in the D.D but as so much improved with just moisturizer ,it is unlikely    AIDS- non compliant with meds or visits to her Provider- not been in care since 2021- used to be followed at [facility] before Was on Biktarvy at one time and was non compliant then VL now is 2 million and cd4 is 11  Started Biktarvy on 12/14/23 . not sure how much compliance to be expected as her social situation is not conducive and so is her substance use- watch closely for Immune reconstitution inflammatory syndrome Genotype no resistance to NRTI, NNRTI, Integrase inhibitor and PI Repeat Vl shows a significant drop in 3 weeks from 2 million to 34,000  RISK for IRIS- watch closely   Toxo IgG very high-  MRI brain no CNS lesions  . TOXO PCR negative On  PCP and MAI prophylaxis. 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

## 2024-01-11 DIAGNOSIS — F1490 Cocaine use, unspecified, uncomplicated: Secondary | ICD-10-CM

## 2024-01-11 DIAGNOSIS — B20 Human immunodeficiency virus [HIV] disease: Secondary | ICD-10-CM | POA: Diagnosis not present

## 2024-01-11 DIAGNOSIS — R627 Adult failure to thrive: Secondary | ICD-10-CM | POA: Diagnosis not present

## 2024-01-11 DIAGNOSIS — J9601 Acute respiratory failure with hypoxia: Secondary | ICD-10-CM | POA: Diagnosis not present

## 2024-01-11 DIAGNOSIS — G9341 Metabolic encephalopathy: Secondary | ICD-10-CM | POA: Diagnosis not present

## 2024-01-11 LAB — BASIC METABOLIC PANEL
Anion gap: 7 (ref 5–15)
BUN: 23 mg/dL — ABNORMAL HIGH (ref 6–20)
CO2: 25 mmol/L (ref 22–32)
Calcium: 9 mg/dL (ref 8.9–10.3)
Chloride: 107 mmol/L (ref 98–111)
Creatinine, Ser: 0.75 mg/dL (ref 0.44–1.00)
GFR, Estimated: >60 mL/min (ref >60)
Glucose, Bld: 87 mg/dL (ref 70–99)
Potassium: 4.1 mmol/L (ref 3.5–5.1)
Sodium: 139 mmol/L (ref 135–145)

## 2024-01-11 LAB — CBC WITH DIFFERENTIAL/PLATELET
Abs Immature Granulocytes: 0.02 10*3/uL (ref 0.00–0.07)
Basophils Absolute: 0.1 10*3/uL (ref 0.0–0.1)
Basophils Relative: 1 %
Eosinophils Absolute: 0.7 10*3/uL — ABNORMAL HIGH (ref 0.0–0.5)
Eosinophils Relative: 11 %
HCT: 26.7 % — ABNORMAL LOW (ref 36.0–46.0)
Hemoglobin: 8.3 g/dL — ABNORMAL LOW (ref 12.0–15.0)
Immature Granulocytes: 0 %
Lymphocytes Relative: 55 %
Lymphs Abs: 3.2 10*3/uL (ref 0.7–4.0)
MCH: 26.9 pg (ref 26.0–34.0)
MCHC: 31.1 g/dL (ref 30.0–36.0)
MCV: 86.7 fL (ref 80.0–100.0)
Monocytes Absolute: 0.5 10*3/uL (ref 0.1–1.0)
Monocytes Relative: 8 %
Neutro Abs: 1.4 10*3/uL — ABNORMAL LOW (ref 1.7–7.7)
Neutrophils Relative %: 25 %
Platelets: 257 10*3/uL (ref 150–400)
RBC: 3.08 MIL/uL — ABNORMAL LOW (ref 3.87–5.11)
RDW: 15.9 % — ABNORMAL HIGH (ref 11.5–15.5)
Smear Review: NORMAL
WBC: 5.8 10*3/uL (ref 4.0–10.5)
nRBC: 0 % (ref 0.0–0.2)

## 2024-01-11 NOTE — Progress Notes (Signed)
Progress Note   Patient: Meghan Jarvis AOZ:[id number] DOB: 10/06/1976 DOA: 12/08/2023     34 DOS: the patient was seen and examined on 01/11/2024     Brief hospital course: 48 year old female past medical history of alcohol abuse, cocaine abuse, homelessness, AIDS, atrial fibrillation, hypothyroidism, type 2 diabetes mellitus presented to [facility] with acute respiratory failure.  She was found down by a friend and could not wake her up.  She was found to be hypoxic in the 70s.  Since she was obtunded she was intubated and admitted by the critical care service.  He was started on antibiotics for pneumonia.  Urine toxicology positive for cocaine.  Patient extubated on 12/30.   Significant lab. QuantiFERON gold indeterminant on 1/15 Fungitell beta D glucan greater than 500 HIV RNA 34,300 (prior was 2 million) Toxoplasma gondii antibody IgG greater than 400, IgM less than 3 Fungus blood culture on 1/20 still pending Indeterminate results for coccidioides immitis antibodies   Assessment and Plan: * Acute metabolic encephalopathy Mental status improved from admission but patient is not the best historian and does not capacity to make decisions at this time.  DSS referral by TOC.  MRI did not show any enhancing lesions but did show severe cerebral atrophy.  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 Mental status improving Have been seen by psychiatry and found to have capacity to make her own decision      abdominal pain Patient intermittently complains of abdominal pain.  Prior imaging is negative.  Patient having bowel movements.  Taper pain medication.   Continue Flexeril   Hypotension Continue midodrine to 2.5 mg 3 times daily.   AIDS (HCC) Viral load 2 million, CD4 14 on admission.  Infectious disease following.  Placed on PCP and MAI prophylaxis with atovaquone and Zithromax.  Toxoplasma antibody IgG came back elevated and MRI of the brain does not show any enhancing  lesions.  Infectious disease specialist started on Biktarvy.  Repeat viral load down to 34,300.  Follow-up on fungal culture ID on board.  Appreciate input I have discussed the case with infectious disease today   Headache-improved   Pancytopenia (HCC)-improved Secondary to HIV.  Platelet platelet count up in the normal range at 285, white blood cell count up in the normal range at 6.6, hemoglobin 8.1.   Protein-calorie malnutrition, severe Continue supplements   Severe sepsis (HCC) Present on admission with tachycardia, tachypnea, fever, pneumonia, acute respiratory failure and acute metabolic encephalopathy.  Patient completed antibiotics.   Cocaine abuse (HCC) Counseled on cessation   Pneumonia of right lower lobe due to infectious organism Completed 5 days of antibiotic.   Acute hypoxic respiratory failure (HCC) Resolved.  Patient extubated on 12/30.  Patient currently breathing on room air.   Alcohol abuse Continue thiamine multivitamin and folic acid   Uncontrolled type 2 diabetes mellitus with hypoglycemia, without long-term current use of insulin (HCC) Hemoglobin A1c 6.0.    Generalized weakness Continue PT OT   Chest pain Atypical in nature.  Troponin x 2 negative.  EKG did not show any ST elevations.  Repeat EKG on 1/17 did not show any ST elevations.   AKI (acute kidney injury) (HCC) Continue monitoring Encouraged on adequate hydration   Right knee pain Patient asked for Ace bandage.  Right knee x-ray negative.  Uric acid 4.4.  Continue to monitor.   Underweight (BMI < 18.5) Last BMI 18.54   Diarrhea-resolved continue as needed Colace for any constipation   Subjective:  Patient seen  and examined at bedside this morning sHe is agreeable to staying until a better placement is obtained Denies nausea vomiting chest pain or cough  Disposition: Patient's insurance looking into a safe discharge destination for her    DVT prophylaxis.  Lovenox      General.  Malnourished lady, in no acute distress. Pulmonary.  Lungs clear bilaterally, normal respiratory effort. CV.  Regular rate and rhythm, no JVD, rub or murmur. Abdomen.  Soft, nontender, nondistended, BS positive. CNS.  Alert and oriented .  No focal neurologic deficit. Extremities.  No edema, no cyanosis, pulses intact and symmetrical.   Data Reviewed:  Vitals:   01/11/24 0427 01/11/24 0809 01/11/24 0900 01/11/24 1200  BP: 105/72 (!) 127/105 94/63 90/60  Pulse: 82 75 79 88  Resp: 18 15    Temp: 98.4 F (36.9 C) 97.9 F (36.6 C)    TempSrc:      SpO2: 100% 99%    Weight:      Height:          Latest Ref Rng & Units 01/11/2024    4:21 AM 01/10/2024    4:51 AM 01/09/2024   12:35 PM  BMP  Glucose 70 - 99 mg/dL 87  99  84   BUN 6 - 20 mg/dL 23  25  19    Creatinine 0.44 - 1.00 mg/dL 1.61  0.96  0.45   Sodium 135 - 145 mmol/L 139  140  138   Potassium 3.5 - 5.1 mmol/L 4.1  3.9  3.8   Chloride 98 - 111 mmol/L 107  106  106   CO2 22 - 32 mmol/L 25  25  24    Calcium 8.9 - 10.3 mg/dL 9.0  9.2  9.3        Latest Ref Rng & Units 01/11/2024    4:21 AM 01/10/2024    4:51 AM 01/09/2024   12:35 PM  CBC  WBC 4.0 - 10.5 K/uL 5.8  5.2  5.4    4.7   Hemoglobin 12.0 - 15.0 g/dL 8.3  8.4  8.8    8.9   Hematocrit 36.0 - 46.0 % 26.7  25.9  27.6    27.9   Platelets 150 - 400 K/uL 257  259  240    257      Author: Loyce Dys, MD 01/11/2024 2:47 PM  For on call review [email or web address].

## 2024-01-11 NOTE — Plan of Care (Signed)
  Problem: Coping: Goal: Ability to adjust to condition or change in health will improve Outcome: Progressing   Problem: Fluid Volume: Goal: Ability to maintain a balanced intake and output will improve Outcome: Progressing   Problem: Health Behavior/Discharge Planning: Goal: Ability to identify and utilize available resources and services will improve Outcome: Progressing Goal: Ability to manage health-related needs will improve Outcome: Progressing   Problem: Metabolic: Goal: Ability to maintain appropriate glucose levels will improve Outcome: Progressing   Problem: Nutritional: Goal: Maintenance of adequate nutrition will improve Outcome: Progressing Goal: Progress toward achieving an optimal weight will improve Outcome: Progressing   Problem: Skin Integrity: Goal: Risk for impaired skin integrity will decrease Outcome: Progressing   Problem: Tissue Perfusion: Goal: Adequacy of tissue perfusion will improve Outcome: Progressing   Problem: Education: Goal: Knowledge of General Education information will improve Description: Including pain rating scale, medication(s)/side effects and non-pharmacologic comfort measures Outcome: Progressing   Problem: Health Behavior/Discharge Planning: Goal: Ability to manage health-related needs will improve Outcome: Progressing   Problem: Clinical Measurements: Goal: Ability to maintain clinical measurements within normal limits will improve Outcome: Progressing Goal: Will remain free from infection Outcome: Progressing Goal: Diagnostic test results will improve Outcome: Progressing Goal: Respiratory complications will improve Outcome: Progressing Goal: Cardiovascular complication will be avoided Outcome: Progressing   Problem: Activity: Goal: Risk for activity intolerance will decrease Outcome: Progressing   Problem: Nutrition: Goal: Adequate nutrition will be maintained Outcome: Progressing   Problem: Coping: Goal:  Level of anxiety will decrease Outcome: Progressing   Problem: Elimination: Goal: Will not experience complications related to bowel motility Outcome: Progressing Goal: Will not experience complications related to urinary retention Outcome: Progressing   Problem: Pain Management: Goal: General experience of comfort will improve Outcome: Progressing   Problem: Safety: Goal: Ability to remain free from injury will improve Outcome: Progressing   Problem: Skin Integrity: Goal: Risk for impaired skin integrity will decrease Outcome: Progressing   Problem: Activity: Goal: Ability to tolerate increased activity will improve Outcome: Progressing   Problem: Respiratory: Goal: Ability to maintain a clear airway and adequate ventilation will improve Outcome: Progressing

## 2024-01-11 NOTE — Progress Notes (Signed)
Left message with Elizebeth Brooking, HIV housing specialist with [address] [facility]) to return call regarding patient. She can be reached at [phone].

## 2024-01-11 NOTE — Progress Notes (Signed)
Date of Admission:  12/08/2023     ID: Meghan Jarvis is a 48 y.o. female  Principal Problem:   Acute metabolic encephalopathy Active Problems:   Alcohol abuse   Acute hypoxic respiratory failure (HCC)   Pneumonia of right lower lobe due to infectious organism   Protein-calorie malnutrition, severe   Uncontrolled type 2 diabetes mellitus with hypoglycemia, without long-term current use of insulin (HCC)   Cocaine abuse (HCC)   AIDS (HCC)   Severe sepsis (HCC)   Chest pain   Pancytopenia (HCC)   Generalized weakness   Diarrhea   Right upper quadrant abdominal pain   Headache   Underweight (BMI < 18.5)   Hypotension   Abdominal pain   Encounter for psychiatric assessment   Rash due to allergy   Right knee pain   AKI (acute kidney injury) (HCC)  ? Meghan Jarvis is a 48 y.o. with a history of AIDS, not compliant with meds or visits ,  , cocaine use was brought in by EMS after being found unresponsive on the couch  in an aquaintance place-   Subjective: Pt is feeling better Eating better   Medications:   atovaquone  1,500 mg Oral Q breakfast   azithromycin  1,200 mg Oral Weekly   bictegravir-emtricitabine-tenofovir AF  1 tablet Oral Daily   DermaCerin   Topical BID   divalproex  500 mg Oral QHS   docusate sodium  100 mg Oral BID   DULoxetine  30 mg Oral QHS   enoxaparin (LOVENOX) injection  40 mg Subcutaneous QHS   feeding supplement  237 mL Oral TID BM   folic acid  1 mg Oral Daily   lidocaine  1 patch Transdermal Q24H   metoprolol tartrate  12.5 mg Oral BID   midodrine  5 mg Oral TID WC   multivitamin with minerals  1 tablet Oral QHS   pantoprazole  40 mg Oral BID   thiamine  100 mg Oral Daily    Objective: Vital signs in last 24 hours: Patient Vitals for the past 24 hrs:  BP Temp Pulse Resp SpO2 Weight  01/11/24 2018 103/71 98.9 F (37.2 C) 98 18 99 % --  01/11/24 1653 99/66 98.8 F (37.1 C) 98 14 100 % --  01/11/24 1500 101/74 98 F (36.7 C) 80 --  100 % --  01/11/24 1200 90/60 -- 88 -- -- --  01/11/24 0900 94/63 -- 79 -- -- --  01/11/24 0809 (!) 127/105 97.9 F (36.6 C) 75 15 99 % --  01/11/24 0427 105/72 98.4 F (36.9 C) 82 18 100 % --  01/11/24 0033 -- -- -- -- -- 66.7 kg    Awake and alert emaciated Chest b/l air entry [id number] Skin- hyperpigmentation, dryness, scaling sometimes           CNS grossly non focal   Lab Results    Latest Ref Rng & Units 01/11/2024    4:21 AM 01/10/2024    4:51 AM 01/09/2024   12:35 PM  CBC  WBC 4.0 - 10.5 K/uL 5.8  5.2  5.4    4.7   Hemoglobin 12.0 - 15.0 g/dL 8.3  8.4  8.8    8.9   Hematocrit 36.0 - 46.0 % 26.7  25.9  27.6    27.9   Platelets 150 - 400 K/uL 257  259  240    257        Latest Ref Rng & Units 01/11/2024  4:21 AM 01/10/2024    4:51 AM 01/09/2024   12:35 PM  CMP  Glucose 70 - 99 mg/dL 87  99  84   BUN 6 - 20 mg/dL 23  25  19    Creatinine 0.44 - 1.00 mg/dL 6.04  5.40  9.81   Sodium 135 - 145 mmol/L 139  140  138   Potassium 3.5 - 5.1 mmol/L 4.1  3.9  3.8   Chloride 98 - 111 mmol/L 107  106  106   CO2 22 - 32 mmol/L 25  25  24    Calcium 8.9 - 10.3 mg/dL 9.0  9.2  9.3   Total Protein 6.5 - 8.1 g/dL   8.1   Total Bilirubin 0.0 - 1.2 mg/dL   0.5   Alkaline Phos 38 - 126 U/L   43   AST 15 - 41 U/L   25   ALT 0 - 44 U/L   24       Microbiology: 12/28 BC NG  Toxo IgG > 400 Toxo PCR neg Toxo igM neg RPR NR CMV DNA neg Crypto neg HIV RNA 2 million>> 34, 000 Cd4 is 14 ( 2.4%) repeat on 1/29 is 416 ( 23%) Beta D glucan > 500 (  repeated) Histoplasma neg Fungal antibodies negative Genosure prime- no resistant mutations     Assessment/Plan: Unresponsive /AMS secondary to cocaine use- resolved   Aspiration leading to acute hypoxic resp failure rt Middle lobe and lower lobe consolidation-- was intubated and mechanically ventilated in the beginning. Now extubated Was On zosyn Completed rx Repeat CXR clear   Rt sided  flank, chest  pain-intermittent  CT abd ok- MRI spine deg changes- no acute findings  Rash- resolved likely was due to bactrim- changed to Atovaquone- Mepron   H/o fall with recurrent ED presentation- had hurt left shoulder before     Pruritus with excoriations and hyperpigmentiaon Possibly due to aids, ? Bactrim aggravated it Much better Scabies was in the D.D but as so much improved with just moisturizer ,it is unlikely    AIDS- non compliant with meds or visits to her Provider- not been in care since 2021- used to be [facility] at [facility] before Was on Biktarvy at one time and was non compliant then VL now is 2 million and cd4 is 11  Started Biktarvy on 12/14/23 . not sure how much compliance to be expected as her social situation is not conducive and so is her substance use- watch closely for Immune reconstitution inflammatory syndrome Genotype no resistance to NRTI, NNRTI, Integrase inhibitor and PI Repeat Vl shows a significant drop in 3 weeks from 2 million to 34,000  RISK for IRIS- watch closely   Toxo IgG very high-  MRI brain no CNS lesions  . TOXO PCR negative On  PCP and MAI prophylaxis. 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

## 2024-01-11 NOTE — Progress Notes (Signed)
1104- Spoke with Oneal Grout owner of [facility] in [place] [phone]) regarding bed availability. States she has bed availability for female. Requests call back in 20-25 minutes as she is away from her desk. Meghan Jarvis with TOC notified.  1208- Reached back out to [facility] regarding patient. Requests FL2 to be sent, along with any other relevant paperwork to fax [phone]. Would like return call on Monday to set up interview. Communicated to [employer].

## 2024-01-11 NOTE — Progress Notes (Signed)
Physical Therapy Treatment Patient Details Name: Meghan Jarvis MRN: [id number] DOB: 11/28/1976 Today's Date: 01/11/2024   History of Present Illness Pt is a 48 year old female admitted with [facility] with acute hypoxic respiratory failure, severe sepsis, community acquired PNA acute metabolic encephalopathy; She was intubated, extubated 12/30    PMH significant for alcohol abuse, cocaine abuse, homelessness, AIDS, atrial fibrillation, hypothyroidism, type 2 diabetes mellitus    PT Comments  Patient alert, agreeable to mobility. Reported 9/10 R hip/flank pain which limited pts mobility and tolerance today. Able to perform bed mobility and transfers modI (though did choose to climb back in bed on side of raised bed rail, but no LOB). She ambulated ~43ft with RW and supervision. No physical assist needed but pt complained of pain throughout and exhibited antalgic gait, RN notified of pt complaints. Returned to bed with all needs in reach. The patient would benefit from further skilled PT intervention to continue to progress towards goals.    If plan is discharge home, recommend the following: Assistance with cooking/housework;Direct supervision/assist for financial management;Direct supervision/assist for medications management;Assist for transportation;Help with stairs or ramp for entrance;Supervision due to cognitive status;A little help with bathing/dressing/bathroom   Can travel by private vehicle     Yes  Equipment Recommendations  Rolling walker (2 wheels)    Recommendations for Other Services       Precautions / Restrictions Precautions Precautions: Fall Restrictions Weight Bearing Restrictions Per Provider Order: No     Mobility  Bed Mobility Overal bed mobility: Modified Independent                  Transfers Overall transfer level: Modified independent Equipment used: Rolling walker (2 wheels), None Transfers: Sit to/from Stand             General transfer  comment: able to stand without RW, but due to increased R hip pain pt limped to RW nearby and utilized throughout session    Ambulation/Gait Ambulation/Gait assistance: Supervision Gait Distance (Feet): 80 Feet Assistive device: Rolling walker (2 wheels)         General Gait Details: antalgic gait due to R hip but no LOB or physical assistance needed   Stairs             Wheelchair Mobility     Tilt Bed    Modified Rankin (Stroke Patients Only)       Balance Overall balance assessment: Needs assistance Sitting-balance support: No upper extremity supported, Feet supported Sitting balance-Leahy Scale: Good     Standing balance support: Bilateral upper extremity supported, During functional activity, Single extremity supported Standing balance-Leahy Scale: Good                              Cognition Arousal: Alert Behavior During Therapy: WFL for tasks assessed/performed                                   General Comments: pleasant, followed commands, self limiting due to pain        Exercises      General Comments        Pertinent Vitals/Pain Pain Assessment Pain Assessment: 0-10 Pain Score: 9  Pain Location: R side flank/hip Pain Descriptors / Indicators: Grimacing, Guarding, Discomfort, Moaning, Aching Pain Intervention(s): Limited activity within patient's tolerance, Monitored during session, Repositioned, Patient requesting pain meds-RN notified  Home Living                          Prior Function            PT Goals (current goals can now be found in the care plan section) Progress towards PT goals: Progressing toward goals    Frequency    Min 1X/week      PT Plan      Co-evaluation              AM-PAC PT "6 Clicks" Mobility   Outcome Measure  Help needed turning from your back to your side while in a flat bed without using bedrails?: None Help needed moving from lying on your  back to sitting on the side of a flat bed without using bedrails?: None Help needed moving to and from a bed to a chair (including a wheelchair)?: None Help needed standing up from a chair using your arms (e.g., wheelchair or bedside chair)?: None Help needed to walk in hospital room?: A Little Help needed climbing 3-5 steps with a railing? : A Little 6 Click Score: 22    End of Session   Activity Tolerance: Patient limited by pain Patient left: in bed;with call bell/phone within reach;with bed alarm set Nurse Communication: Mobility status PT Visit Diagnosis: Other abnormalities of gait and mobility (R26.89);Difficulty in walking, not elsewhere classified (R26.2);Muscle weakness (generalized) (M62.81)     Time: 6962-9528 PT Time Calculation (min) (ACUTE ONLY): 10 min  Charges:    $Therapeutic Activity: 8-22 mins PT General Charges $$ ACUTE PT VISIT: 1 Visit                     Olga Coaster PT, DPT 11:42 AM,01/11/24

## 2024-01-11 NOTE — [place] FL2 (Signed)
[place] MEDICAID FL2 LEVEL OF CARE FORM     IDENTIFICATION  Patient Name: Meghan Jarvis Birthdate: 10-08-1976 Sex: female Admission Date (Current Location): 12/08/2023  [facility] and [place] Number:  Chiropodist and Address:  [facility], [address], [place], [place] [address]      Provider Number: [id number]  Attending Physician Name and Address:  Loyce Dys, MD  Relative Name and Phone Number:  nipes,meredith Clearence Cheek)  [phone]    Current Level of Care: Hospital Recommended Level of Care:  (Group Home) Prior Approval Number:    Date Approved/Denied:   PASRR Number: [id number]  Discharge Plan:  (Group Home)    Current Diagnoses: Patient Active Problem List   Diagnosis Date Noted   Right knee pain 12/26/2023   AKI (acute kidney injury) (HCC) 12/26/2023   Encounter for psychiatric assessment 12/24/2023   Rash due to allergy 12/24/2023   Abdominal pain 12/17/2023   Hypotension 12/16/2023   Headache 12/15/2023   Underweight (BMI < 18.5) 12/15/2023   Right upper quadrant abdominal pain 12/14/2023   Chest pain 12/13/2023   Pancytopenia (HCC) 12/13/2023   Generalized weakness 12/13/2023   Diarrhea 12/13/2023   Acute metabolic encephalopathy 12/12/2023   Uncontrolled type 2 diabetes mellitus with hypoglycemia, without long-term current use of insulin (HCC) 12/12/2023   Cocaine abuse (HCC) 12/12/2023   AIDS (HCC) 12/12/2023   Severe sepsis (HCC) 12/12/2023   Protein-calorie malnutrition, severe 12/10/2023   Acute hypoxic respiratory failure (HCC) 12/09/2023   Pneumonia of right lower lobe due to infectious organism 12/09/2023   Substance induced mood disorder (HCC) 11/30/2023   Crack cocaine use 11/30/2023   Cocaine abuse with cocaine-induced mood disorder (HCC) 11/28/2023   Homeless 11/28/2023   Atrial fibrillation, new onset (HCC) 05/11/2020   Pyelonephritis 05/09/2020   HIV (human immunodeficiency virus  infection) (HCC)    Alcohol use disorder, severe, dependence (HCC) 05/18/2016   Alcohol abuse 05/17/2016    Orientation RESPIRATION BLADDER Height & Weight     Self, Time, Situation, Place  Normal Continent Weight: 147 lb 0.8 oz (66.7 kg) Height:  5' 7.99" (172.7 cm)  BEHAVIORAL SYMPTOMS/MOOD NEUROLOGICAL BOWEL NUTRITION STATUS      Continent    AMBULATORY STATUS COMMUNICATION OF NEEDS Skin   Limited Assist Verbally Normal                       Personal Care Assistance Level of Assistance  Bathing, Feeding, Dressing Bathing Assistance: Limited assistance Feeding assistance: Limited assistance Dressing Assistance: Limited assistance     Functional Limitations Info  Sight, Hearing, Speech Sight Info: Adequate Hearing Info: Adequate Speech Info: Adequate    SPECIAL CARE FACTORS FREQUENCY  PT (By licensed PT), OT (By licensed OT)     PT Frequency: 3 times a week OT Frequency: 3 times a week            Contractures Contractures Info: Not present    Additional Factors Info  Code Status, Allergies, Isolation Precautions Code Status Info: FULL Allergies Info: Fish Allergy, Shellfish Allergy, Buprenorphine Hcl, Morphine And Codeine     Isolation Precautions Info: Contact precautions     Current Medications (01/11/2024):  This is the current hospital active medication list Current Facility-Administered Medications  Medication Dose Route Frequency Provider Last Rate Last Admin   acetaminophen (TYLENOL) tablet 650 mg  650 mg Oral Q6H PRN Alford Highland, MD   650 mg at 01/09/24 2124   atovaquone [facility])  750 MG/5ML suspension 1,500 mg  1,500 mg Oral Q breakfast Lynn Ito, MD   1,500 mg at 01/11/24 0855   azithromycin (ZITHROMAX) tablet 1,200 mg  1,200 mg Oral Weekly Aleda Grana, RPH   1,200 mg at 01/09/24 1324   bictegravir-emtricitabine-tenofovir AF (BIKTARVY) 50-200-25 MG per tablet 1 tablet  1 tablet Oral Daily Lynn Ito, MD   1  tablet at 01/11/24 0855   cyclobenzaprine (FLEXERIL) tablet 5 mg  5 mg Oral TID PRN Alford Highland, MD   5 mg at 01/05/24 1835   DermaCerin CREA   Topical BID Alford Highland, MD   Given at 01/11/24 1610   diphenhydrAMINE (BENADRYL) capsule 25 mg  25 mg Oral Q8H PRN Dezii, Gordy Councilman, DO   25 mg at 01/06/24 2240   divalproex (DEPAKOTE ER) 24 hr tablet 500 mg  500 mg Oral QHS Verner Chol, MD   500 mg at 01/10/24 2045   docusate sodium (COLACE) capsule 100 mg  100 mg Oral BID Alford Highland, MD   100 mg at 01/09/24 1044   DULoxetine (CYMBALTA) DR capsule 30 mg  30 mg Oral QHS Dezii, Alexandra, DO   30 mg at 01/10/24 2045   enoxaparin (LOVENOX) injection 40 mg  40 mg Subcutaneous QHS Vida Rigger, MD   40 mg at 01/10/24 2045   feeding supplement (ENSURE ENLIVE / ENSURE PLUS) liquid 237 mL  237 mL Oral TID BM Vida Rigger, MD   237 mL at 01/11/24 1340   folic acid (FOLVITE) tablet 1 mg  1 mg Oral Daily Vida Rigger, MD   1 mg at 01/11/24 0906   ipratropium-albuterol (DUONEB) 0.5-2.5 (3) MG/3ML nebulizer solution 3 mL  3 mL Nebulization Q6H PRN Alford Highland, MD       lactulose (CHRONULAC) 10 GM/15ML solution 20 g  20 g Oral Daily PRN Arnetha Courser, MD       lidocaine (LIDODERM) 5 % 1 patch  1 patch Transdermal Q24H Alford Highland, MD   1 patch at 01/10/24 1811   metoprolol tartrate (LOPRESSOR) tablet 12.5 mg  12.5 mg Oral BID Arnetha Courser, MD   12.5 mg at 01/11/24 0905   midodrine (PROAMATINE) tablet 5 mg  5 mg Oral TID WC Arnetha Courser, MD   5 mg at 01/11/24 1234   multivitamin with minerals tablet 1 tablet  1 tablet Oral QHS Aleda Grana, RPH   1 tablet at 01/10/24 2045   nicotine polacrilex (NICORETTE) gum 2 mg  2 mg Oral PRN Manuela Schwartz, NP       oxyCODONE (Oxy IR/ROXICODONE) immediate release tablet 2.5 mg  2.5 mg Oral Q8H PRN Alford Highland, MD   2.5 mg at 01/11/24 1058   pantoprazole (PROTONIX) EC tablet 40 mg  40 mg Oral BID Alford Highland, MD   40 mg at  01/11/24 9604   polyethylene glycol (MIRALAX / GLYCOLAX) packet 17 g  17 g Oral Daily PRN Alford Highland, MD   17 g at 01/05/24 1002   thiamine (VITAMIN B1) tablet 100 mg  100 mg Oral Daily Vida Rigger, MD   100 mg at 01/11/24 5409     Discharge Medications: Please see discharge summary for a list of discharge medications.  Relevant Imaging Results:  Relevant Lab Results:   Additional Information [id number]  Allena Katz, LCSW

## 2024-01-11 NOTE — TOC Progression Note (Signed)
Transition of Care [facility]) - Progression Note    Patient Details  Name: Meghan Jarvis MRN: [id number] Date of Birth: Jan 23, 1976  Transition of Care [facility]) CM/SW Contact  Allena Katz, LCSW Phone Number: 01/11/2024, 3:31 PM  Clinical Narrative:   CSW spoke with patient who is agreeable to group home placement. Laurena Bering reported they would not be finding a group home TOC would be responsible. TOC to start search.         Expected Discharge Plan and Services         Expected Discharge Date: 01/10/24                                     Social Determinants of Health (SDOH) Interventions SDOH Screenings   Food Insecurity: Patient Unable To Answer (12/09/2023)  Recent Concern: Food Insecurity - Food Insecurity Present (09/26/2023)  Housing: Patient Unable To Answer (12/09/2023)  Recent Concern: Housing - Medium Risk (09/26/2023)  Transportation Needs: Patient Unable To Answer (12/09/2023)  Recent Concern: Transportation Needs - Unmet Transportation Needs (10/11/2023)  Utilities: Patient Unable To Answer (12/09/2023)  Recent Concern: Utilities - At Risk (09/25/2023)  Tobacco Use: High Risk (12/08/2023)    Readmission Risk Interventions     No data to display

## 2024-01-11 NOTE — TOC Progression Note (Signed)
Transition of Care [facility]) - Progression Note    Patient Details  Name: Meghan Jarvis MRN: [id number] Date of Birth: [birth year]  Transition of Care [facility]) CM/SW Contact  Allena Katz, LCSW Phone Number: 01/11/2024, 3:58 PM   Referral faxed to [facility]  Fax [phone]   Clinical Narrative:            Expected Discharge Plan and Services         Expected Discharge Date: 01/10/24                                     Social Determinants of Health (SDOH) Interventions SDOH Screenings   Food Insecurity: Patient Unable To Answer (12/09/2023)  Recent Concern: Food Insecurity - Food Insecurity Present (09/26/2023)  Housing: Patient Unable To Answer (12/09/2023)  Recent Concern: Housing - Medium Risk (09/26/2023)  Transportation Needs: Patient Unable To Answer (12/09/2023)  Recent Concern: Transportation Needs - Unmet Transportation Needs (10/11/2023)  Utilities: Patient Unable To Answer (12/09/2023)  Recent Concern: Utilities - At Risk (09/25/2023)  Tobacco Use: High Risk (12/08/2023)    Readmission Risk Interventions     No data to display

## 2024-01-12 DIAGNOSIS — G9341 Metabolic encephalopathy: Secondary | ICD-10-CM | POA: Diagnosis not present

## 2024-01-12 LAB — CBC WITH DIFFERENTIAL/PLATELET
Abs Immature Granulocytes: 0.01 10*3/uL (ref 0.00–0.07)
Basophils Absolute: 0.1 10*3/uL (ref 0.0–0.1)
Basophils Relative: 1 %
Eosinophils Absolute: 0.7 10*3/uL — ABNORMAL HIGH (ref 0.0–0.5)
Eosinophils Relative: 9 %
HCT: 27 % — ABNORMAL LOW (ref 36.0–46.0)
Hemoglobin: 8.4 g/dL — ABNORMAL LOW (ref 12.0–15.0)
Immature Granulocytes: 0 %
Lymphocytes Relative: 57 %
Lymphs Abs: 4.3 10*3/uL — ABNORMAL HIGH (ref 0.7–4.0)
MCH: 26.8 pg (ref 26.0–34.0)
MCHC: 31.1 g/dL (ref 30.0–36.0)
MCV: 86 fL (ref 80.0–100.0)
Monocytes Absolute: 0.6 10*3/uL (ref 0.1–1.0)
Monocytes Relative: 8 %
Neutro Abs: 1.8 10*3/uL (ref 1.7–7.7)
Neutrophils Relative %: 25 %
Platelets: 263 10*3/uL (ref 150–400)
RBC: 3.14 MIL/uL — ABNORMAL LOW (ref 3.87–5.11)
RDW: 16 % — ABNORMAL HIGH (ref 11.5–15.5)
Smear Review: NORMAL
WBC: 7.4 10*3/uL (ref 4.0–10.5)
nRBC: 0 % (ref 0.0–0.2)

## 2024-01-12 LAB — BASIC METABOLIC PANEL
Anion gap: 10 (ref 5–15)
BUN: 27 mg/dL — ABNORMAL HIGH (ref 6–20)
CO2: 25 mmol/L (ref 22–32)
Calcium: 9.2 mg/dL (ref 8.9–10.3)
Chloride: 103 mmol/L (ref 98–111)
Creatinine, Ser: 0.66 mg/dL (ref 0.44–1.00)
GFR, Estimated: >60 mL/min (ref >60)
Glucose, Bld: 92 mg/dL (ref 70–99)
Potassium: 4.1 mmol/L (ref 3.5–5.1)
Sodium: 138 mmol/L (ref 135–145)

## 2024-01-12 NOTE — Plan of Care (Signed)
  Problem: Coping: Goal: Ability to adjust to condition or change in health will improve Outcome: Not Progressing   Problem: Fluid Volume: Goal: Ability to maintain a balanced intake and output will improve Outcome: Not Progressing   Problem: Health Behavior/Discharge Planning: Goal: Ability to identify and utilize available resources and services will improve Outcome: Not Progressing Goal: Ability to manage health-related needs will improve Outcome: Not Progressing   Problem: Nutritional: Goal: Progress toward achieving an optimal weight will improve Outcome: Progressing

## 2024-01-12 NOTE — Progress Notes (Signed)
Progress Note   Patient: Meghan Jarvis WUJ:[id number] DOB: 1976-02-08 DOA: 12/08/2023     35 DOS: the patient was seen and examined on 01/12/2024    Brief hospital course: 48 year old female past medical history of alcohol abuse, cocaine abuse, homelessness, AIDS, atrial fibrillation, hypothyroidism, type 2 diabetes mellitus presented to [facility] regional with acute respiratory failure.  She was found down by a friend and could not wake her up.  She was found to be hypoxic in the 70s.  Since she was obtunded she was intubated and admitted by the critical care service.  He was started on antibiotics for pneumonia.  Urine toxicology positive for cocaine.  Patient extubated on 12/30.   Significant lab. QuantiFERON gold indeterminant on 1/15 Fungitell beta D glucan greater than 500 HIV RNA 34,300 (prior was 2 million) Toxoplasma gondii antibody IgG greater than 400, IgM less than 3 Fungus blood culture on 1/20 still pending Indeterminate results for coccidioides immitis antibodies   Assessment and Plan: * Acute metabolic encephalopathy Mental status improved from admission but patient is not the best historian and does not capacity to make decisions at this time.  DSS referral by TOC.  MRI did not show any enhancing lesions but did show severe cerebral atrophy.  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 Mental status improved Have been seen by psychiatry and found to have capacity to make her own decision      abdominal pain Patient intermittently complains of abdominal pain.  Prior imaging is negative.  Patient having bowel movements.  Taper pain medication.   Continue Flexeril   Hypotension-improved Midodrine discontinued   AIDS (HCC) Viral load 2 million, CD4 14 on admission.  Infectious disease following.  Placed on PCP and MAI prophylaxis with atovaquone and Zithromax.  Toxoplasma antibody IgG came back elevated and MRI of the brain does not show any enhancing lesions.   Infectious disease specialist started on Biktarvy.  Repeat viral load down to 34,300.  Follow-up on fungal culture ID on board.  Appreciate input I have discussed the case with infectious disease today   Headache-improved   Pancytopenia (HCC)-improved Secondary to HIV.  Platelet platelet count up in the normal range at 285, white blood cell count up in the normal range at 6.6, hemoglobin 8.1.   Protein-calorie malnutrition, severe Continue supplements   Severe sepsis (HCC) Present on admission with tachycardia, tachypnea, fever, pneumonia, acute respiratory failure and acute metabolic encephalopathy.  Patient completed antibiotics.   Cocaine abuse (HCC) Counseled on cessation   Pneumonia of right lower lobe due to infectious organism Completed 5 days of antibiotic.   Acute hypoxic respiratory failure (HCC) Resolved.  Patient extubated on 12/30.  Patient currently breathing on room air.   Alcohol abuse Continue thiamine multivitamin and folic acid   Uncontrolled type 2 diabetes mellitus with hypoglycemia, without long-term current use of insulin (HCC) Hemoglobin A1c 6.0.    Generalized weakness Continue PT OT   Chest pain Atypical in nature.  Troponin x 2 negative.  EKG did not show any ST elevations.  Repeat EKG on 1/17 did not show any ST elevations.   AKI (acute kidney injury) (HCC) Continue monitoring Encouraged on adequate hydration   Right knee pain Patient asked for Ace bandage.  Right knee x-ray negative.  Uric acid 4.4.  Continue to monitor.   Underweight (BMI < 18.5) Last BMI 18.54   Diarrhea-resolved continue as needed Colace for any constipation   Subjective:  Seen and examined at bedside this morning Complaining of  left knee pain Denies nausea vomiting abdominal pain chest pain   Disposition: Patient's insurance looking into a safe discharge destination for her     DVT prophylaxis.  Lovenox     General.  Malnourished lady, in no acute  distress. Pulmonary.  Lungs clear bilaterally, normal respiratory effort. CV.  Regular rate and rhythm, no JVD, rub or murmur. Abdomen.  Soft, nontender, nondistended, BS positive. CNS.  Alert and oriented .  No focal neurologic deficit. Extremities.  No edema, no cyanosis, pulses intact and symmetrical.   Data Reviewed:    Latest Ref Rng & Units 01/12/2024    5:11 AM 01/11/2024    4:21 AM 01/10/2024    4:51 AM  BMP  Glucose 70 - 99 mg/dL 92  87  99   BUN 6 - 20 mg/dL 27  23  25    Creatinine 0.44 - 1.00 mg/dL 5.78  4.69  6.29   Sodium 135 - 145 mmol/L 138  139  140   Potassium 3.5 - 5.1 mmol/L 4.1  4.1  3.9   Chloride 98 - 111 mmol/L 103  107  106   CO2 22 - 32 mmol/L 25  25  25    Calcium 8.9 - 10.3 mg/dL 9.2  9.0  9.2      Vitals:   01/11/24 1653 01/11/24 2018 01/12/24 0746 01/12/24 1648  BP: 99/66 103/71 101/76 97/67  Pulse: 98 98 65 98  Resp: 14 18 16 20   Temp: 98.8 F (37.1 C) 98.9 F (37.2 C) 97.6 F (36.4 C) 97.8 F (36.6 C)  TempSrc:    Oral  SpO2: 100% 99% 94% 100%  Weight:      Height:          Latest Ref Rng & Units 01/12/2024    5:11 AM 01/11/2024    4:21 AM 01/10/2024    4:51 AM  CBC  WBC 4.0 - 10.5 K/uL 7.4  5.8  5.2   Hemoglobin 12.0 - 15.0 g/dL 8.4  8.3  8.4   Hematocrit 36.0 - 46.0 % 27.0  26.7  25.9   Platelets 150 - 400 K/uL 263  257  259      Author: Loyce Dys, MD 01/12/2024 5:13 PM  For on call review [email or web address].

## 2024-01-13 DIAGNOSIS — G9341 Metabolic encephalopathy: Secondary | ICD-10-CM | POA: Diagnosis not present

## 2024-01-13 NOTE — Plan of Care (Signed)
  Problem: Coping: Goal: Ability to adjust to condition or change in health will improve Outcome: Progressing   Problem: Fluid Volume: Goal: Ability to maintain a balanced intake and output will improve Outcome: Progressing   Problem: Health Behavior/Discharge Planning: Goal: Ability to identify and utilize available resources and services will improve Outcome: Progressing Goal: Ability to manage health-related needs will improve Outcome: Progressing   Problem: Metabolic: Goal: Ability to maintain appropriate glucose levels will improve Outcome: Progressing   Problem: Nutritional: Goal: Maintenance of adequate nutrition will improve Outcome: Progressing Goal: Progress toward achieving an optimal weight will improve Outcome: Progressing   Problem: Skin Integrity: Goal: Risk for impaired skin integrity will decrease Outcome: Progressing   Problem: Tissue Perfusion: Goal: Adequacy of tissue perfusion will improve Outcome: Progressing   Problem: Education: Goal: Knowledge of General Education information will improve Description: Including pain rating scale, medication(s)/side effects and non-pharmacologic comfort measures Outcome: Progressing   Problem: Health Behavior/Discharge Planning: Goal: Ability to manage health-related needs will improve Outcome: Progressing   Problem: Clinical Measurements: Goal: Ability to maintain clinical measurements within normal limits will improve Outcome: Progressing Goal: Will remain free from infection Outcome: Progressing Goal: Diagnostic test results will improve Outcome: Progressing Goal: Respiratory complications will improve Outcome: Progressing Goal: Cardiovascular complication will be avoided Outcome: Progressing   Problem: Activity: Goal: Risk for activity intolerance will decrease Outcome: Progressing   Problem: Nutrition: Goal: Adequate nutrition will be maintained Outcome: Progressing   Problem: Coping: Goal:  Level of anxiety will decrease Outcome: Progressing   Problem: Elimination: Goal: Will not experience complications related to bowel motility Outcome: Progressing Goal: Will not experience complications related to urinary retention Outcome: Progressing   Problem: Pain Management: Goal: General experience of comfort will improve Outcome: Progressing   Problem: Safety: Goal: Ability to remain free from injury will improve Outcome: Progressing   Problem: Skin Integrity: Goal: Risk for impaired skin integrity will decrease Outcome: Progressing   Problem: Activity: Goal: Ability to tolerate increased activity will improve Outcome: Progressing   Problem: Respiratory: Goal: Ability to maintain a clear airway and adequate ventilation will improve Outcome: Progressing

## 2024-01-13 NOTE — Plan of Care (Signed)
   Problem: Nutrition: Goal: Adequate nutrition will be maintained Outcome: Progressing

## 2024-01-13 NOTE — Progress Notes (Signed)
Progress Note   Patient: Meghan Jarvis ZOX:[id number] DOB: 04-Mar-1976 DOA: 12/08/2023     36 DOS: the patient was seen and examined on 01/13/2024     Brief hospital course: 48 year old female past medical history of alcohol abuse, cocaine abuse, homelessness, AIDS, atrial fibrillation, hypothyroidism, type 2 diabetes mellitus presented to [facility] with acute respiratory failure.  She was found down by a friend and could not wake her up.  She was found to be hypoxic in the 70s.  Since she was obtunded she was intubated and admitted by the critical care service.  He was started on antibiotics for pneumonia.  Urine toxicology positive for cocaine.  Patient extubated on 12/30.   Significant lab. QuantiFERON gold indeterminant on 1/15 Fungitell beta D glucan greater than 500 HIV RNA 34,300 (prior was 2 million) Toxoplasma gondii antibody IgG greater than 400, IgM less than 3 Fungus blood culture on 1/20 still pending Indeterminate results for coccidioides immitis antibodies   Assessment and Plan: * Acute metabolic encephalopathy-improved Mental status improved from admission but patient is not the best historian and does not capacity to make decisions at this time.  DSS referral by TOC.  MRI did not show any enhancing lesions but did show severe cerebral atrophy.  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 Mental status improved Have been seen by psychiatry and found to have capacity to make her own decision  Monitor mental status     abdominal pain Patient intermittently complains of abdominal pain.  Prior imaging is negative.  Patient having bowel movements.  Taper pain medication.   Continue Flexeril continue FlexerilHypotension-improved Midodrine discontinued   AIDS (HCC) Viral load 2 million, CD4 14 on admission.  Infectious disease following.  Placed on PCP and MAI prophylaxis with atovaquone and Zithromax.  Toxoplasma antibody IgG came back elevated and MRI of the  brain does not show any enhancing lesions.  Infectious disease specialist started on Biktarvy.  Repeat viral load down to 34,300.  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  Platelet platelet count up in the normal range at 285, white blood cell count up in the normal range at 6.6, hemoglobin 8.1.   Protein-calorie malnutrition, severe Continue supplements   Severe sepsis (HCC) Present on admission with tachycardia, tachypnea, fever, pneumonia, acute respiratory failure and acute metabolic encephalopathy.  Patient completed antibiotics.   Cocaine abuse (HCC) Patient has been counseled on cessation of cocaine use   Pneumonia of right lower lobe due to infectious organism Completed 5 days of antibiotic.   Acute hypoxic respiratory failure (HCC) Resolved.  Patient extubated on 12/30.  Patient currently breathing on room air.   Alcohol abuse Continue thiamine multivitamin and folic acid   Uncontrolled type 2 diabetes mellitus with hypoglycemia, without long-term current use of insulin (HCC) Hemoglobin A1c 6.0.    Generalized weakness Continue PT OT   Chest pain Atypical in nature.  Troponin x 2 negative.  EKG did not show any ST elevations.  Repeat EKG on 1/17 did not show any ST elevations.   AKI (acute kidney injury) (HCC) Continue monitoring Encouraged on adequate hydration   Right knee pain Patient asked for Ace bandage.  Right knee x-ray negative.  Uric acid 4.4.  Continue to monitor.   Underweight (BMI < 18.5) Last BMI 18.54   Diarrhea-resolved continue as needed Colace for any constipation   Subjective:  Patient seen and examined at bedside this morning Admits to improvement in  left knee pain Denies nausea vomiting chest pain or cough TOC still working on placement   Disposition: Patient's insurance looking into a safe discharge destination for her     DVT prophylaxis.  Lovenox      General.  Malnourished lady, in no acute distress. Pulmonary.  Lungs clear bilaterally, normal respiratory effort. CV.  Regular rate and rhythm, no JVD, rub or murmur. Abdomen.  Soft, nontender, nondistended, BS positive. CNS.  Alert and oriented .  No focal neurologic deficit. Extremities.  No edema, no cyanosis, pulses intact and symmetrical. Vitals:   01/13/24 0500 01/13/24 0604 01/13/24 1028 01/13/24 1244  BP:  97/61 100/63 (!) 85/68  Pulse:  90 92 89  Resp:  16 18   Temp:  98.2 F (36.8 C) 98.4 F (36.9 C) 98.2 F (36.8 C)  TempSrc:  Oral  Oral  SpO2:  100% 100% 100%  Weight: 57.1 kg     Height:          Latest Ref Rng & Units 01/12/2024    5:11 AM 01/11/2024    4:21 AM 01/10/2024    4:51 AM  CBC  WBC 4.0 - 10.5 K/uL 7.4  5.8  5.2   Hemoglobin 12.0 - 15.0 g/dL 8.4  8.3  8.4   Hematocrit 36.0 - 46.0 % 27.0  26.7  25.9   Platelets 150 - 400 K/uL 263  257  259        Latest Ref Rng & Units 01/12/2024    5:11 AM 01/11/2024    4:21 AM 01/10/2024    4:51 AM  BMP  Glucose 70 - 99 mg/dL 92  87  99   BUN 6 - 20 mg/dL 27  23  25    Creatinine 0.44 - 1.00 mg/dL 9.52  8.41  3.24   Sodium 135 - 145 mmol/L 138  139  140   Potassium 3.5 - 5.1 mmol/L 4.1  4.1  3.9   Chloride 98 - 111 mmol/L 103  107  106   CO2 22 - 32 mmol/L 25  25  25    Calcium 8.9 - 10.3 mg/dL 9.2  9.0  9.2      Author: Loyce Dys, MD 01/13/2024 2:25 PM  For on call review [email or web address].

## 2024-01-14 DIAGNOSIS — F1410 Cocaine abuse, uncomplicated: Secondary | ICD-10-CM | POA: Diagnosis not present

## 2024-01-14 DIAGNOSIS — J9601 Acute respiratory failure with hypoxia: Secondary | ICD-10-CM | POA: Diagnosis not present

## 2024-01-14 DIAGNOSIS — B20 Human immunodeficiency virus [HIV] disease: Secondary | ICD-10-CM | POA: Diagnosis not present

## 2024-01-14 DIAGNOSIS — G9341 Metabolic encephalopathy: Secondary | ICD-10-CM | POA: Diagnosis not present

## 2024-01-14 MED ORDER — MIDODRINE HCL 5 MG PO TABS
10.0000 mg | ORAL_TABLET | Freq: Three times a day (TID) | ORAL | Status: DC
  Administered 2024-01-14 – 2024-03-04 (×146): 10 mg via ORAL
  Filled 2024-01-14 (×146): qty 2

## 2024-01-14 NOTE — Progress Notes (Signed)
Date of Admission:  12/08/2023     ID: Meghan Jarvis is a 48 y.o. female  Principal Problem:   Acute metabolic encephalopathy Active Problems:   Alcohol abuse   Acute hypoxic respiratory failure (HCC)   Pneumonia of right lower lobe due to infectious organism   Protein-calorie malnutrition, severe   Uncontrolled type 2 diabetes mellitus with hypoglycemia, without long-term current use of insulin (HCC)   Cocaine abuse (HCC)   AIDS (HCC)   Severe sepsis (HCC)   Chest pain   Pancytopenia (HCC)   Generalized weakness   Diarrhea   Right upper quadrant abdominal pain   Headache   Underweight (BMI < 18.5)   Hypotension   Abdominal pain   Encounter for psychiatric assessment   Rash due to allergy   Right knee pain   AKI (acute kidney injury) (HCC)  ? Meghan Jarvis is a 48 y.o. with a history of AIDS, not compliant with meds or visits ,  , cocaine use was brought in by EMS after being found unresponsive on the couch  in an aquaintance place-   Subjective: Patient is feeling better says she would like to drink Ensure and eat some crackers No pain No itching   Medications:   atovaquone  1,500 mg Oral Q breakfast   azithromycin  1,200 mg Oral Weekly   bictegravir-emtricitabine-tenofovir AF  1 tablet Oral Daily   DermaCerin   Topical BID   divalproex  500 mg Oral QHS   docusate sodium  100 mg Oral BID   DULoxetine  30 mg Oral QHS   enoxaparin (LOVENOX) injection  40 mg Subcutaneous QHS   feeding supplement  237 mL Oral TID BM   folic acid  1 mg Oral Daily   lidocaine  1 patch Transdermal Q24H   midodrine  10 mg Oral TID WC   multivitamin with minerals  1 tablet Oral QHS   pantoprazole  40 mg Oral BID   thiamine  100 mg Oral Daily    Objective: Vital signs in last 24 hours: Patient Vitals for the past 24 hrs:  BP Temp Temp src Pulse Resp SpO2  01/14/24 2035 96/66 98.4 F (36.9 C) -- 92 -- 100 %  01/14/24 1620 (!) 93/59 98.6 F (37 C) Oral 82 18 100 %   01/14/24 0731 (!) 84/59 98.6 F (37 C) -- 84 17 100 %  01/14/24 0611 93/63 -- -- 85 -- 94 %  Awake and alert Respond to questions appropriately [id number] Skin- hyperpigmentation, dryness, scaling has improved significantly.          CNS grossly non focal   Lab Results    Latest Ref Rng & Units 01/12/2024    5:11 AM 01/11/2024    4:21 AM 01/10/2024    4:51 AM  CBC  WBC 4.0 - 10.5 K/uL 7.4  5.8  5.2   Hemoglobin 12.0 - 15.0 g/dL 8.4  8.3  8.4   Hematocrit 36.0 - 46.0 % 27.0  26.7  25.9   Platelets 150 - 400 K/uL 263  257  259        Latest Ref Rng & Units 01/12/2024    5:11 AM 01/11/2024    4:21 AM 01/10/2024    4:51 AM  CMP  Glucose 70 - 99 mg/dL 92  87  99   BUN 6 - 20 mg/dL 27  23  25    Creatinine 0.44 - 1.00 mg/dL 1.91  4.78  2.95   Sodium  135 - 145 mmol/L 138  139  140   Potassium 3.5 - 5.1 mmol/L 4.1  4.1  3.9   Chloride 98 - 111 mmol/L 103  107  106   CO2 22 - 32 mmol/L 25  25  25    Calcium 8.9 - 10.3 mg/dL 9.2  9.0  9.2       Microbiology: 12/28 BC NG  Toxo IgG > 400 Toxo PCR neg Toxo igM neg RPR NR CMV DNA neg Crypto neg HIV RNA 2 million>> 34, 000 Cd4 is 14 ( 2.4%) repeat on 1/29 is 416 ( 23%) Beta D glucan > 500 (  repeated) Histoplasma neg Fungal antibodies negative Genosure prime- no resistant mutations     Assessment/Plan: Encephalopathy secondary to cocaine use as resolved Aspiration leading to acute hypoxic respiratory failure with right middle lobe and lower lobe consolidation.  Status post intubation and was mechanically ventilated in the early part of the admission.  Now has been extubated for many days Took treatment with Zosyn completed treatment repeat chest x-ray is clear      Rt sided  flank, chest pain-intermittent  CT abd ok- MRI spine deg changes- no acute findings  Rash- resolved likely was due to bactrim- changed to Atovaquone- Mepron   H/o fall with recurrent ED presentation- had hurt left shoulder before     Pruritus  with excoriations and hyperpigmentiaon Possibly due to aids, ? Bactrim aggravated it Much better Scabies was in the D.D but as so much improved with just moisturizer ,it is unlikely    AIDS- non compliant with meds or visits to her Provider- not been in care since 2021- used to be followed at [facility] before Was on Biktarvy at one time and was non compliant then VL now is 2 million and cd4 is 11  Started Biktarvy on 12/14/23 . not sure how much compliance to be expected as her social situation is not conducive and so is her substance use- watch closely for Immune reconstitution inflammatory syndrome Genotype no resistance to NRTI, NNRTI, Integrase inhibitor and PI Repeat Vl shows a significant drop in 3 weeks from 2 million to 34,000 and CD4 is improved significantly from 11 to 413  RISK for IRIS- watch closely   Toxo IgG very high-  MRI brain no CNS lesions  . TOXO PCR negative On  PCP and MAI prophylaxis. 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

## 2024-01-14 NOTE — Plan of Care (Signed)
  Problem: Skin Integrity: Goal: Risk for impaired skin integrity will decrease Outcome: Progressing   Problem: Nutrition: Goal: Adequate nutrition will be maintained Outcome: Progressing   Problem: Coping: Goal: Level of anxiety will decrease Outcome: Progressing

## 2024-01-14 NOTE — Progress Notes (Signed)
PT Cancellation Note  Patient Details Name: Meghan Jarvis MRN: [id number] DOB: Feb 19, 1976   Cancelled Treatment:    Reason Eval/Treat Not Completed: Other (comment): Patient asleep upon PT arrival. Patient did not awaken to therapist voice, awakens to therapist light touch. PT attempted to engage patient in therapy session, and continuously shook head "no", turned over in bed, and returned to sleep. Will re-attempt at later date/time.    Howie Ill, PT, DPT 01/14/24 2:47 PM

## 2024-01-14 NOTE — TOC Progression Note (Addendum)
Transition of Care [facility]) - Progression Note    Patient Details  Name: Meghan Jarvis MRN: [id number] Date of Birth: [birth year]  [facility] of [facility]) CM/SW Contact  Allena Katz, LCSW Phone Number: 01/14/2024, 4:06 PM  Clinical Narrative:   CSW attempted to reach out to [facility] in [place] but unable to reach anyone in admissions.  [facility] does not have any female openings.   Laurena Bering told me to try [facility] at the number [phone]  But that number does not work.   Expected Discharge Plan and Services         Expected Discharge Date: 01/10/24                                     Social Determinants of Health (SDOH) Interventions SDOH Screenings   Food Insecurity: Patient Unable To Answer (12/09/2023)  Recent Concern: Food Insecurity - Food Insecurity Present (09/26/2023)  Housing: Patient Unable To Answer (12/09/2023)  Recent Concern: Housing - Medium Risk (09/26/2023)  Transportation Needs: Patient Unable To Answer (12/09/2023)  Recent Concern: Transportation Needs - Unmet Transportation Needs (10/11/2023)  Utilities: Patient Unable To Answer (12/09/2023)  Recent Concern: Utilities - At Risk (09/25/2023)  Tobacco Use: High Risk (12/08/2023)    Readmission Risk Interventions     No data to display

## 2024-01-14 NOTE — Progress Notes (Signed)
Progress Note   Patient: Meghan Jarvis ZOX:[id number] DOB: September 18, 1976 DOA: 12/08/2023     37 DOS: the patient was seen and examined on 01/14/2024    Brief hospital course: 48 year old female past medical history of alcohol abuse, cocaine abuse, homelessness, AIDS, atrial fibrillation, hypothyroidism, type 2 diabetes mellitus presented to [facility] regional with acute respiratory failure.  She was found down by a friend and could not wake her up.  She was found to be hypoxic in the 70s.  Since she was obtunded she was intubated and admitted by the critical care service.  He was started on antibiotics for pneumonia.  Urine toxicology positive for cocaine.  Patient extubated on 12/30.   Significant lab. QuantiFERON gold indeterminant on 1/15 Fungitell beta D glucan greater than 500 HIV RNA 34,300 (prior was 2 million) Toxoplasma gondii antibody IgG greater than 400, IgM less than 3 Fungus blood culture on 1/20 still pending Indeterminate results for coccidioides immitis antibodies   Assessment and Plan: * Acute metabolic encephalopathy-improved Mental status improved from admission but patient is not the best historian and does not capacity to make decisions at this time.  DSS referral by TOC.  MRI did not show any enhancing lesions but did show severe cerebral atrophy.  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 Mental status improved Have been seen by psychiatry and found to have capacity to make her own decision  Continue to monitor mental status TOC is working on placement     abdominal pain Patient intermittently complains of abdominal pain.  Prior imaging is negative.  Patient having bowel movements.  Taper pain medication.   Continue Flexeril  Hypotension-improved Midodrine on board   AIDS (HCC) Viral load 2 million, CD4 14 on admission.  Infectious disease following.  Placed on PCP and MAI prophylaxis with atovaquone and Zithromax.  Toxoplasma antibody IgG came back  elevated and MRI of the brain does not show any enhancing lesions.  Infectious disease specialist started on Biktarvy.  Repeat viral load down to 34,300.  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  Platelet platelet count up in the normal range at 285, white blood cell count up in the normal range at 6.6, hemoglobin 8.1.   Protein-calorie malnutrition, severe Continue supplements   Severe sepsis (HCC) Present on admission with tachycardia, tachypnea, fever, pneumonia, acute respiratory failure and acute metabolic encephalopathy.  Patient completed antibiotics.   Cocaine abuse (HCC) Patient has been counseled on cessation of cocaine use   Pneumonia of right lower lobe due to infectious organism Completed 5 days of antibiotic.   Acute hypoxic respiratory failure (HCC) Resolved.  Patient extubated on 12/30.  Patient currently breathing on room air.   Alcohol abuse Continue thiamine multivitamin and folic acid   Uncontrolled type 2 diabetes mellitus with hypoglycemia, without long-term current use of insulin (HCC) Hemoglobin A1c 6.0.    Generalized weakness Continue PT OT   Chest pain Atypical in nature.  Troponin x 2 negative.  EKG did not show any ST elevations.  Repeat EKG on 1/17 did not show any ST elevations.   AKI (acute kidney injury) (HCC) Continue monitoring Encouraged on adequate hydration   Right knee pain Patient asked for Ace bandage.  Right knee x-ray negative.  Uric acid 4.4.  Continue to monitor.   Underweight (BMI < 18.5) Last BMI 18.54   Diarrhea-resolved continue as needed Colace for any constipation   Subjective:  Patient seen and examined at  bedside this morning Admits to improvement in left knee pain Denies nausea vomiting chest pain or cough TOC still working on placement   Disposition: Patient's insurance looking into a safe discharge destination for her     DVT  prophylaxis.  Lovenox     General.  Malnourished lady, in no acute distress. Pulmonary.  Lungs clear bilaterally, normal respiratory effort. CV.  Regular rate and rhythm, no JVD, rub or murmur. Abdomen.  Soft, nontender, nondistended, BS positive. CNS.  Alert and oriented .  No focal neurologic deficit. Extremities.  No edema, no cyanosis, pulses intact and symmetrical.  Data Reviewed:  Vitals:   01/13/24 1244 01/14/24 0611 01/14/24 0731 01/14/24 1620  BP: (!) 85/68 93/63 (!) 84/59 (!) 93/59  Pulse: 89 85 84 82  Resp:   17 18  Temp: 98.2 F (36.8 C)  98.6 F (37 C) 98.6 F (37 C)  TempSrc: Oral   Oral  SpO2: 100% 94% 100% 100%  Weight:      Height:          Latest Ref Rng & Units 01/12/2024    5:11 AM 01/11/2024    4:21 AM 01/10/2024    4:51 AM  BMP  Glucose 70 - 99 mg/dL 92  87  99   BUN 6 - 20 mg/dL 27  23  25    Creatinine 0.44 - 1.00 mg/dL 9.14  7.82  9.56   Sodium 135 - 145 mmol/L 138  139  140   Potassium 3.5 - 5.1 mmol/L 4.1  4.1  3.9   Chloride 98 - 111 mmol/L 103  107  106   CO2 22 - 32 mmol/L 25  25  25    Calcium 8.9 - 10.3 mg/dL 9.2  9.0  9.2        Latest Ref Rng & Units 01/12/2024    5:11 AM 01/11/2024    4:21 AM 01/10/2024    4:51 AM  CBC  WBC 4.0 - 10.5 K/uL 7.4  5.8  5.2   Hemoglobin 12.0 - 15.0 g/dL 8.4  8.3  8.4   Hematocrit 36.0 - 46.0 % 27.0  26.7  25.9   Platelets 150 - 400 K/uL 263  257  259        Author: Loyce Dys, MD 01/14/2024 5:28 PM  For on call review [email or web address].

## 2024-01-14 NOTE — Progress Notes (Signed)
 Assisted patient to bedside commode

## 2024-01-14 NOTE — Plan of Care (Signed)
  Problem: Coping: Goal: Ability to adjust to condition or change in health will improve Outcome: Progressing   Problem: Fluid Volume: Goal: Ability to maintain a balanced intake and output will improve Outcome: Progressing   Problem: Health Behavior/Discharge Planning: Goal: Ability to identify and utilize available resources and services will improve Outcome: Progressing Goal: Ability to manage health-related needs will improve Outcome: Progressing   Problem: Metabolic: Goal: Ability to maintain appropriate glucose levels will improve Outcome: Progressing   Problem: Nutritional: Goal: Maintenance of adequate nutrition will improve Outcome: Progressing Goal: Progress toward achieving an optimal weight will improve Outcome: Progressing   Problem: Skin Integrity: Goal: Risk for impaired skin integrity will decrease Outcome: Progressing   Problem: Tissue Perfusion: Goal: Adequacy of tissue perfusion will improve Outcome: Progressing   Problem: Education: Goal: Knowledge of General Education information will improve Description: Including pain rating scale, medication(s)/side effects and non-pharmacologic comfort measures Outcome: Progressing   Problem: Health Behavior/Discharge Planning: Goal: Ability to manage health-related needs will improve Outcome: Progressing   Problem: Clinical Measurements: Goal: Ability to maintain clinical measurements within normal limits will improve Outcome: Progressing Goal: Will remain free from infection Outcome: Progressing Goal: Diagnostic test results will improve Outcome: Progressing Goal: Respiratory complications will improve Outcome: Progressing Goal: Cardiovascular complication will be avoided Outcome: Progressing   Problem: Activity: Goal: Risk for activity intolerance will decrease Outcome: Progressing   Problem: Nutrition: Goal: Adequate nutrition will be maintained Outcome: Progressing   Problem: Coping: Goal:  Level of anxiety will decrease Outcome: Progressing   Problem: Elimination: Goal: Will not experience complications related to bowel motility Outcome: Progressing Goal: Will not experience complications related to urinary retention Outcome: Progressing   Problem: Pain Management: Goal: General experience of comfort will improve Outcome: Progressing   Problem: Safety: Goal: Ability to remain free from injury will improve Outcome: Progressing   Problem: Skin Integrity: Goal: Risk for impaired skin integrity will decrease Outcome: Progressing   Problem: Activity: Goal: Ability to tolerate increased activity will improve Outcome: Progressing   Problem: Respiratory: Goal: Ability to maintain a clear airway and adequate ventilation will improve Outcome: Progressing

## 2024-01-14 NOTE — Progress Notes (Signed)
Spoke with Elizebeth Brooking with [facility] regarding HIV/AIDS funding. Patient would need to complete an application and will need to have a birth certificate and social security card to qualify. Ms. Arbutus Ped is willing to meet with patient either in the hospital or once d/c to assist with application completion, IF patient able to obtain these items.

## 2024-01-15 DIAGNOSIS — G9341 Metabolic encephalopathy: Secondary | ICD-10-CM | POA: Diagnosis not present

## 2024-01-15 DIAGNOSIS — B20 Human immunodeficiency virus [HIV] disease: Secondary | ICD-10-CM | POA: Diagnosis not present

## 2024-01-15 DIAGNOSIS — F1410 Cocaine abuse, uncomplicated: Secondary | ICD-10-CM | POA: Diagnosis not present

## 2024-01-15 DIAGNOSIS — J9601 Acute respiratory failure with hypoxia: Secondary | ICD-10-CM | POA: Diagnosis not present

## 2024-01-15 MED ORDER — ENSURE ENLIVE PO LIQD
237.0000 mL | Freq: Four times a day (QID) | ORAL | Status: DC
  Administered 2024-01-15 – 2024-02-17 (×123): 237 mL via ORAL

## 2024-01-15 NOTE — Progress Notes (Signed)
 Physical Therapy Treatment Patient Details Name: Meghan Jarvis MRN: [id number] DOB: 1976/01/12 Today's Date: 01/15/2024   History of Present Illness Pt is a 48 year old female admitted with [facility] with acute hypoxic respiratory failure, severe sepsis, community acquired PNA acute metabolic encephalopathy; She was intubated, extubated 12/30    PMH significant for alcohol abuse, cocaine abuse, homelessness, AIDS, atrial fibrillation, hypothyroidism, type 2 diabetes mellitus    PT Comments  Pt ready for session.  Stands and walks 13' self limiting in hallway with RW and cga.  She does c/o increased back pain today and L knee pain.  Pt remains with decreased safety awareness and needs cues for hand placements, walker positioning and education to keep walker with her upon returning to room as she tries to leave it at end of bed and walk without it to chair.  Remains at increased fall risk due to safety awareness but has no LOB or buckling with gait.   If plan is discharge home, recommend the following: Assistance with cooking/housework;Direct supervision/assist for financial management;Direct supervision/assist for medications management;Assist for transportation;Help with stairs or ramp for entrance;Supervision due to cognitive status;A little help with bathing/dressing/bathroom   Can travel by private vehicle        Equipment Recommendations  Rolling walker (2 wheels)    Recommendations for Other Services       Precautions / Restrictions Precautions Precautions: Fall Restrictions Weight Bearing Restrictions Per Provider Order: No     Mobility  Bed Mobility                 Patient Response: Cooperative  Transfers Overall transfer level: Needs assistance Equipment used: Rolling walker (2 wheels), None Transfers: Sit to/from Stand Sit to Stand: Supervision           General transfer comment: supervision and cues for safety as she tries to leave walker at end of bed when  returning to recliner    Ambulation/Gait Ambulation/Gait assistance: Supervision, Contact guard assist Gait Distance (Feet): 80 Feet Assistive device: Rolling walker (2 wheels) Gait Pattern/deviations: Antalgic, Step-to pattern, Step-through pattern Gait velocity: dec     General Gait Details: irregular step pattern   Stairs             Wheelchair Mobility     Tilt Bed Tilt Bed Patient Response: Cooperative  Modified Rankin (Stroke Patients Only)       Balance Overall balance assessment: Needs assistance Sitting-balance support: No upper extremity supported, Feet supported Sitting balance-Leahy Scale: Good     Standing balance support: Bilateral upper extremity supported, During functional activity, Single extremity supported Standing balance-Leahy Scale: Good                              Cognition Arousal: Alert Behavior During Therapy: WFL for tasks assessed/performed Overall Cognitive Status: No family/caregiver present to determine baseline cognitive functioning                                          Exercises      General Comments General comments (skin integrity, edema, etc.): graham crackers and packaging (multiple packs) on floor, therapist cleaned up for safe mobility in room; nurse notified      Pertinent Vitals/Pain Pain Assessment Pain Assessment: Faces Faces Pain Scale: Hurts little more Pain Location: back primarily, some L knee Pain  Descriptors / Indicators: Sore, Discomfort Pain Intervention(s): Limited activity within patient's tolerance, Monitored during session, Repositioned    Home Living                          Prior Function            PT Goals (current goals can now be found in the care plan section) Progress towards PT goals: Progressing toward goals    Frequency    Min 1X/week      PT Plan      Co-evaluation              AM-PAC PT 6 Clicks Mobility    Outcome Measure  Help needed turning from your back to your side while in a flat bed without using bedrails?: None Help needed moving from lying on your back to sitting on the side of a flat bed without using bedrails?: None Help needed moving to and from a bed to a chair (including a wheelchair)?: A Little Help needed standing up from a chair using your arms (e.g., wheelchair or bedside chair)?: A Little Help needed to walk in hospital room?: A Little Help needed climbing 3-5 steps with a railing? : A Little 6 Click Score: 20    End of Session Equipment Utilized During Treatment: Gait belt Activity Tolerance: Patient tolerated treatment well;Patient limited by pain Patient left: in chair;with call bell/phone within reach;with chair alarm set Nurse Communication: Mobility status PT Visit Diagnosis: Other abnormalities of gait and mobility (R26.89);Difficulty in walking, not elsewhere classified (R26.2);Muscle weakness (generalized) (M62.81)     Time: 8870-8860 PT Time Calculation (min) (ACUTE ONLY): 10 min  Charges:    $Gait Training: 8-22 mins PT General Charges $$ ACUTE PT VISIT: 1 Visit                   Lauraine Gills, PTA 01/15/24, 11:57 AM

## 2024-01-15 NOTE — Progress Notes (Signed)
 Progress Note   Patient: Meghan Jarvis FMW:[id number] DOB: [birth year] DOA: 12/08/2023     38 DOS: the patient was seen and examined on 01/15/2024     Brief hospital course: 48 year old female past medical history of alcohol abuse, cocaine abuse, homelessness, AIDS, atrial fibrillation, hypothyroidism, type 2 diabetes mellitus presented to [facility] with acute respiratory failure.  She was found down by a friend and could not wake her up.  She was found to be hypoxic in the 70s.  Since she was obtunded she was intubated and admitted by the critical care service.  He was started on antibiotics for pneumonia.  Urine toxicology positive for cocaine.  Patient extubated on 12/30.   Significant lab. QuantiFERON gold indeterminant on 1/15 Fungitell beta D glucan greater than 500 HIV RNA 34,300 (prior was 2 million) Toxoplasma gondii antibody IgG greater than 400, IgM less than 3 Fungus blood culture on 1/20 still pending Indeterminate results for coccidioides immitis antibodies   Assessment and Plan: * Acute metabolic encephalopathy-improved Mental status improved from admission but patient is not the best historian and does not capacity to make decisions at this time.  DSS referral by TOC.  MRI did not show any enhancing lesions but did show severe cerebral atrophy.  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 Mental status improved Have been seen by psychiatry and found to have capacity to make her own decision  Continue to monitor mental status TOC working on placement     abdominal pain Patient intermittently complains of abdominal pain.  Prior imaging is negative.  Patient having bowel movements.  Taper pain medication.   Continue Flexeril    Hypotension-improved Midodrine  on board   AIDS (HCC) Viral load 2 million, CD4 14 on admission.  Infectious disease following.  Placed on PCP and MAI prophylaxis with atovaquone  and Zithromax .  Toxoplasma antibody IgG came back  elevated and MRI of the brain does not show any enhancing lesions.  Infectious disease specialist started on Biktarvy .  Repeat viral load down to 34,300.  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  Platelet platelet count up in the normal range at 285, white blood cell count up in the normal range at 6.6, hemoglobin 8.1.   Protein-calorie malnutrition, severe Continue supplementation   Severe sepsis (HCC) Present on admission with tachycardia, tachypnea, fever, pneumonia, acute respiratory failure and acute metabolic encephalopathy.  Patient completed antibiotics.   Cocaine abuse (HCC) Patient has been counseled on cessation of cocaine use   Pneumonia of right lower lobe due to infectious organism Completed 5 days of antibiotic.   Acute hypoxic respiratory failure (HCC) Resolved.  Patient extubated on 12/30.  Patient currently breathing on room air.   Alcohol abuse Continue thiamine  multivitamin and folic acid    Uncontrolled type 2 diabetes mellitus with hypoglycemia, without long-term current use of insulin  (HCC) Hemoglobin A1c 6.0.    Generalized weakness Continue PT OT   Chest pain Atypical in nature.  Troponin x 2 negative.  EKG did not show any ST elevations.  Repeat EKG on 1/17 did not show any ST elevations.   AKI (acute kidney injury) (HCC) Continue monitoring Encouraged on adequate hydration   Right knee pain Patient asked for Ace bandage.  Right knee x-ray negative.  Uric acid 4.4.  Continue to monitor.   Underweight (BMI < 18.5) Last BMI 18.54   Diarrhea-resolved continue as needed Colace for any constipation   Subjective:  Patient seen and examined  at bedside this morning Denies any acute overnight events TOC continues to work on placement   Disposition: Patient's insurance looking into a safe discharge destination for her     DVT prophylaxis.  Lovenox      General.   Malnourished lady, in no acute distress. Pulmonary.  Lungs clear bilaterally, normal respiratory effort. CV.  Regular rate and rhythm, no JVD, rub or murmur. Abdomen.  Soft, nontender, nondistended, BS positive. CNS.  Alert and oriented .  No focal neurologic deficit. Extremities.  No edema, no cyanosis, pulses intact and symmetrical.   Data Reviewed:  Vitals:   01/14/24 2035 01/15/24 0500 01/15/24 0554 01/15/24 0839  BP: 96/66  99/64 (!) 86/62  Pulse: 92  84 80  Resp:   20 19  Temp: 98.4 F (36.9 C)  98.5 F (36.9 C)   TempSrc:      SpO2: 100%  100% 98%  Weight:  58.2 kg    Height:          Latest Ref Rng & Units 01/12/2024    5:11 AM 01/11/2024    4:21 AM 01/10/2024    4:51 AM  CBC  WBC 4.0 - 10.5 K/uL 7.4  5.8  5.2   Hemoglobin 12.0 - 15.0 g/dL 8.4  8.3  8.4   Hematocrit 36.0 - 46.0 % 27.0  26.7  25.9   Platelets 150 - 400 K/uL 263  257  259        Latest Ref Rng & Units 01/12/2024    5:11 AM 01/11/2024    4:21 AM 01/10/2024    4:51 AM  BMP  Glucose 70 - 99 mg/dL 92  87  99   BUN 6 - 20 mg/dL 27  23  25    Creatinine 0.44 - 1.00 mg/dL 9.33  9.24  9.29   Sodium 135 - 145 mmol/L 138  139  140   Potassium 3.5 - 5.1 mmol/L 4.1  4.1  3.9   Chloride 98 - 111 mmol/L 103  107  106   CO2 22 - 32 mmol/L 25  25  25    Calcium  8.9 - 10.3 mg/dL 9.2  9.0  9.2      Author: Drue ONEIDA Potter, MD 01/15/2024 4:49 PM  For on call review [email or web address].

## 2024-01-15 NOTE — Progress Notes (Signed)
 Nutrition Follow-up  DOCUMENTATION CODES:   Severe malnutrition in context of chronic illness  INTERVENTION:   -Increase Ensure Enlive po to QID, each supplement provides 350 kcal and 20 grams of protein.  -Continue MVI with minerals daily -Continue 1 mg folic acid  daily -Continue 100 mg thiamine  daily -Continue Magic cup TID with meals, each supplement provides 290 kcal and 9 grams of protein   NUTRITION DIAGNOSIS:   Severe Malnutrition related to chronic illness (HIV, homelessness, substance abuse) as evidenced by percent weight loss, severe fat depletion, severe muscle depletion.  Ongoing  GOAL:   Patient will meet greater than or equal to 90% of their needs  Progressing   MONITOR:   PO intake, Supplement acceptance, Labs, Weight trends, I & O's, Skin  REASON FOR ASSESSMENT:   Consult Enteral/tube feeding initiation and management  ASSESSMENT:   48 y/o female with h/o homelessness, HIV, substance abuse, Afib, mood disorder, hypothyroidism, type 2 diabetes mellitus and depression who is admitted with aspiration PNA.  Reviewed I/O's: +240 ml x 24 hours and +3.5 L since 01/01/24   Per psych notes on 01/08/24, pt with moderate cognitive decline; pt's decision making capacity can fluctuate.   Pt remains on a dysphagia 3 diet. Pt with variable oral intake. Noted meal completions 0-100%. Pt is consuming Ensure supplements.   Wt has been stable over the past month.   Per TOC notes, pt awaiting group home placement. DSS also involved; pt may need guardianship.   Medications reviewed and include biktarvy , colace, cymbalta , folic acid , protonix , and thiamine .   Labs reviewed: CBGS: 103 (inpatient orders for glycemic control are ).    Diet Order:   Diet Order             DIET DYS 3 Room service appropriate? Yes; Fluid consistency: Thin  Diet effective now                   EDUCATION NEEDS:   Not appropriate for education at this time  Skin:  Skin Assessment:  Skin Integrity Issues: Skin Integrity Issues:: Other (Comment) Other: fissure wound to gluteal cleft, partial thickness rt lateral knee abrasion, excoriation to linear rt buttock  Last BM:  01/15/24  Height:   Ht Readings from Last 1 Encounters:  12/09/23 5' 7.99 (1.727 m)    Weight:   Wt Readings from Last 1 Encounters:  01/15/24 58.2 kg    Ideal Body Weight:  63.6 kg  BMI:  Body mass index is 19.51 kg/m.  Estimated Nutritional Needs:   Kcal:  1800-2100kcal/day  Protein:  90-105g/day  Fluid:  1.8-2.1L/day    Margery ORN, RD, LDN, CDCES Registered Dietitian III Certified Diabetes Care and Education Specialist If unable to reach this RD, please use RD Inpatient group chat on secure chat between hours of 8am-4 pm daily

## 2024-01-15 NOTE — Plan of Care (Signed)
  Problem: Coping: Goal: Ability to adjust to condition or change in health will improve Outcome: Progressing   Problem: Fluid Volume: Goal: Ability to maintain a balanced intake and output will improve Outcome: Progressing   Problem: Skin Integrity: Goal: Risk for impaired skin integrity will decrease Outcome: Progressing   Problem: Clinical Measurements: Goal: Will remain free from infection Outcome: Progressing   Problem: Nutrition: Goal: Adequate nutrition will be maintained Outcome: Progressing   Problem: Coping: Goal: Level of anxiety will decrease Outcome: Progressing

## 2024-01-15 NOTE — Progress Notes (Signed)
 Date of Admission:  12/08/2023     ID: Meghan Jarvis is a 48 y.o. female  Principal Problem:   Acute metabolic encephalopathy Active Problems:   Alcohol abuse   Acute hypoxic respiratory failure (HCC)   Pneumonia of right lower lobe due to infectious organism   Protein-calorie malnutrition, severe   Uncontrolled type 2 diabetes mellitus with hypoglycemia, without long-term current use of insulin  (HCC)   Cocaine abuse (HCC)   AIDS (HCC)   Severe sepsis (HCC)   Chest pain   Pancytopenia (HCC)   Generalized weakness   Diarrhea   Right upper quadrant abdominal pain   Headache   Underweight (BMI < 18.5)   Hypotension   Abdominal pain   Encounter for psychiatric assessment   Rash due to allergy   Right knee pain   AKI (acute kidney injury) (HCC)  ? Meghan Jarvis is a 48 y.o. with a history of AIDS, not compliant with meds or visits ,  , cocaine use was brought in by EMS after being found unresponsive on the couch  in an aquaintance place-   Subjective: Patient is doing fine she says She is ambulating in the room She is eating better No itching Medications:   atovaquone   1,500 mg Oral Q breakfast   azithromycin   1,200 mg Oral Weekly   bictegravir-emtricitabine -tenofovir  AF  1 tablet Oral Daily   DermaCerin   Topical BID   divalproex   500 mg Oral QHS   docusate sodium   100 mg Oral BID   DULoxetine   30 mg Oral QHS   enoxaparin  (LOVENOX ) injection  40 mg Subcutaneous QHS   feeding supplement  237 mL Oral QID   folic acid   1 mg Oral Daily   lidocaine   1 patch Transdermal Q24H   midodrine   10 mg Oral TID WC   multivitamin with minerals  1 tablet Oral QHS   pantoprazole   40 mg Oral BID   thiamine   100 mg Oral Daily    Objective: Vital signs in last 24 hours: Patient Vitals for the past 24 hrs:  BP Temp Pulse Resp SpO2 Weight  01/15/24 2034 106/80 98.1 F (36.7 C) 81 -- 100 % --  01/15/24 1657 90/61 98.6 F (37 C) 85 19 98 % --  01/15/24 0839 (!) 86/62 -- 80  19 98 % --  01/15/24 0554 99/64 98.5 F (36.9 C) 84 20 100 % --  01/15/24 0500 -- -- -- -- -- 58.2 kg  Awake and alert Respond to questions appropriately [id number] Lungs clear to auscultation Abdomen soft Skin- hyperpigmentation, dryness, scaling has improved significantly. CNS grossly non focal   Lab Results    Latest Ref Rng & Units 01/12/2024    5:11 AM 01/11/2024    4:21 AM 01/10/2024    4:51 AM  CBC  WBC 4.0 - 10.5 K/uL 7.4  5.8  5.2   Hemoglobin 12.0 - 15.0 g/dL 8.4  8.3  8.4   Hematocrit 36.0 - 46.0 % 27.0  26.7  25.9   Platelets 150 - 400 K/uL 263  257  259        Latest Ref Rng & Units 01/12/2024    5:11 AM 01/11/2024    4:21 AM 01/10/2024    4:51 AM  CMP  Glucose 70 - 99 mg/dL 92  87  99   BUN 6 - 20 mg/dL 27  23  25    Creatinine 0.44 - 1.00 mg/dL 9.33  9.24  9.29  Sodium 135 - 145 mmol/L 138  139  140   Potassium 3.5 - 5.1 mmol/L 4.1  4.1  3.9   Chloride 98 - 111 mmol/L 103  107  106   CO2 22 - 32 mmol/L 25  25  25    Calcium  8.9 - 10.3 mg/dL 9.2  9.0  9.2       Microbiology: 12/28 BC NG  Toxo IgG > 400 Toxo PCR neg Toxo igM neg RPR NR CMV DNA neg Crypto neg HIV RNA 2 million>> 34, 000 Cd4 is 14 ( 2.4%) repeat on 1/29 is 416 ( 23%) Beta D glucan > 500 (  repeated) Histoplasma neg Fungal antibodies negative Genosure prime- no resistant mutations QuantiFERON gold indeterminate.  No treatment needed now.  Can repeat it later.     Assessment/Plan: Encephalopathy secondary to cocaine use as resolved  Aspiration leading to acute hypoxic respiratory failure with right middle lobe and lower lobe consolidation.  Status post intubation and was mechanically ventilated in the early part of the admission.  Now has been extubated for many days Took treatment with Zosyn  completed treatment repeat chest x-ray is clear      Rt sided  flank, chest pain-intermittent  CT abd ok- MRI spine deg changes- no acute findings  Rash- resolved likely was due to bactrim -  changed to Atovaquone - Mepron    H/o fall with recurrent ED presentation- had hurt left shoulder before     Pruritus with excoriations and hyperpigmentiaon Possibly due to aids, ? Bactrim  aggravated it Much better Scabies was in the D.D but as so much improved with just moisturizer ,it is unlikely    AIDS- non compliant with meds or visits to her Provider- not been in care since 2021- used to be followed at [facility] before Was on Biktarvy  at one time and was non compliant then VL now is 2 million and cd4 is 11  Started Biktarvy  on 12/14/23 . not sure how much compliance to be expected as her social situation is not conducive and so is her substance use- watch closely for Immune reconstitution inflammatory syndrome Genotype no resistance to NRTI, NNRTI, Integrase inhibitor and PI Repeat Vl shows a significant drop in 3 weeks from 2 million to 34,000 and CD4 is improved significantly from 11 to 413  RISK for IRIS- watch closely   Toxo IgG very high-  MRI brain no CNS lesions  . TOXO PCR negative On  PCP and MAI prophylaxis. Was On bactrim  for PCP and Toxo prophylaxis- because of new rash concerning for bactrim  allergy was changed to atovaquone  Rash resolved  Beta D glucan high > 500 (fungal antibodies negative Just observe.    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

## 2024-01-15 NOTE — Progress Notes (Signed)
 Occupational Therapy Treatment Patient Details Name: Meghan Jarvis MRN: [id number] DOB: 1976-04-17 Today's Date: 01/15/2024   History of present illness Pt is a 48 year old female admitted with [facility] with acute hypoxic respiratory failure, severe sepsis, community acquired PNA acute metabolic encephalopathy; She was intubated, extubated 12/30    PMH significant for alcohol abuse, cocaine abuse, homelessness, AIDS, atrial fibrillation, hypothyroidism, type 2 diabetes mellitus   OT comments  Chart reviewed, pt greeted in bed, oriented to self and place, agreeable to OT tx session targeting improving functional activity tolerance for improved ADL performance. Graham crackers and packages noted to be throughout room on floor, cleaned up for safe mobility. Bed mobility competed with MOD I, STS with supervision, amb to bathroom with RW with CGA, attempts without RW  with CGA as well. Toileting completed with supervision and grooming tasks completed with supervision in sitting. Pt cognitive/safety deficits continue to inhibit safe indep ADL completion. Pt is making progress towards goals, discharge remains appropriate. OT will continue to follow.       If plan is discharge home, recommend the following:  Assistance with cooking/housework;Assistance with feeding;Direct supervision/assist for medications management;Direct supervision/assist for financial management;Assist for transportation;Help with stairs or ramp for entrance;Supervision due to cognitive status;A little help with walking and/or transfers;A little help with bathing/dressing/bathroom   Equipment Recommendations  Other (comment) (2WW)    Recommendations for Other Services      Precautions / Restrictions Precautions Precautions: Fall Restrictions Weight Bearing Restrictions Per Provider Order: No       Mobility Bed Mobility Overal bed mobility: Modified Independent                  Transfers   Equipment used: Rolling  walker (2 wheels), None Transfers: Sit to/from Stand Sit to Stand: Supervision                 Balance Overall balance assessment: Needs assistance Sitting-balance support: No upper extremity supported, Feet supported Sitting balance-Leahy Scale: Good     Standing balance support: Bilateral upper extremity supported, During functional activity, Single extremity supported Standing balance-Leahy Scale: Good                             ADL either performed or assessed with clinical judgement   ADL Overall ADL's : Needs assistance/impaired Eating/Feeding: Set up;Sitting   Grooming: Wash/dry hands;Wash/dry face;Sitting Grooming Details (indicate cue type and reason): at edge of bed             Lower Body Dressing: Supervision/safety;Sitting/lateral leans Lower Body Dressing Details (indicate cue type and reason): donn socks, vcs for grips on outside Toilet Transfer: Contact guard assist;Rolling walker (2 wheels);Regular Teacher, Adult Education Details (indicate cue type and reason): frequent verbal cues for safety with RW Toileting- Clothing Manipulation and Hygiene: Supervision/safety;Sitting/lateral lean       Functional mobility during ADLs: Supervision/safety;Contact guard assist;Rolling walker (2 wheels) (approx 15' two attempts)      Extremity/Trunk Assessment              Vision       Perception     Praxis      Cognition Arousal: Alert Behavior During Therapy: Impulsive   Area of Impairment: Orientation, Attention, Memory, Following commands, Safety/judgement, Awareness, Problem solving                 Orientation Level: Disoriented to, Situation, Time Current Attention Level: Focused Memory: Decreased short-term  memory, Decreased recall of precautions Following Commands: Follows one step commands with increased time Safety/Judgement: Decreased awareness of safety, Decreased awareness of deficits Awareness:  Intellectual Problem Solving: Difficulty sequencing, Requires verbal cues          Exercises Other Exercises Other Exercises: edu re: role of OT, role of rehab, safe ADL completion    Shoulder Instructions       General Comments graham crackers and packaging (multiple packs) on floor, therapist cleaned up for safe mobility in room; nurse notified    Pertinent Vitals/ Pain       Pain Assessment Pain Assessment: Faces Faces Pain Scale: Hurts a little bit Pain Location: L knee, she reports she fell on Friday and did not tell anyone nurse notified Pain Descriptors / Indicators: Discomfort Pain Intervention(s): Monitored during session  Home Living                                          Prior Functioning/Environment              Frequency  Min 1X/week        Progress Toward Goals  OT Goals(current goals can now be found in the care plan section)  Progress towards OT goals: Progressing toward goals  Acute Rehab OT Goals Time For Goal Achievement: 01/18/24  Plan      Co-evaluation                 AM-PAC OT 6 Clicks Daily Activity     Outcome Measure   Help from another person eating meals?: None Help from another person taking care of personal grooming?: A Little Help from another person toileting, which includes using toliet, bedpan, or urinal?: A Little Help from another person bathing (including washing, rinsing, drying)?: A Little Help from another person to put on and taking off regular upper body clothing?: None Help from another person to put on and taking off regular lower body clothing?: None 6 Click Score: 21    End of Session Equipment Utilized During Treatment: Rolling walker (2 wheels)  OT Visit Diagnosis: Other abnormalities of gait and mobility (R26.89);Muscle weakness (generalized) (M62.81)   Activity Tolerance Patient tolerated treatment well   Patient Left in chair;with call bell/phone within reach;with  chair alarm set   Nurse Communication Mobility status        Time: 9062-9047 OT Time Calculation (min): 15 min  Charges: OT General Charges $OT Visit: 1 Visit OT Treatments $Self Care/Home Management : 8-22 mins  Therisa Sheffield, OTD OTR/L  01/15/24, 10:05 AM

## 2024-01-16 DIAGNOSIS — G9341 Metabolic encephalopathy: Secondary | ICD-10-CM | POA: Diagnosis not present

## 2024-01-16 LAB — HIV-1 RNA QUANT-NO REFLEX-BLD
HIV 1 RNA Quant: 460 {copies}/mL
LOG10 HIV-1 RNA: 2.663 {Log}

## 2024-01-16 MED ORDER — DICYCLOMINE HCL 20 MG PO TABS
20.0000 mg | ORAL_TABLET | Freq: Three times a day (TID) | ORAL | Status: DC | PRN
  Administered 2024-01-27 – 2024-03-02 (×10): 20 mg via ORAL
  Filled 2024-01-16 (×13): qty 1

## 2024-01-16 NOTE — TOC Progression Note (Addendum)
 Transition of Care [facility]) - Progression Note    Patient Details  Name: Meghan Jarvis MRN: [id number] Date of Birth: 1976/03/23  Transition of Care [facility]) CM/SW Contact  Ladene Lady, LCSW Phone Number: 01/16/2024, 12:28 PM  Clinical Narrative:   CSW spoke with [name] who state she may can take her but would like us  to refax the referral and she wants to talk with the patient.   [name] [phone].          Expected Discharge Plan and Services         Expected Discharge Date: 01/10/24                                     Social Determinants of Health (SDOH) Interventions SDOH Screenings   Food Insecurity: Patient Unable To Answer (12/09/2023)  Recent Concern: Food Insecurity - Food Insecurity Present (09/26/2023)  Housing: Patient Unable To Answer (12/09/2023)  Recent Concern: Housing - Medium Risk (09/26/2023)  Transportation Needs: Patient Unable To Answer (12/09/2023)  Recent Concern: Transportation Needs - Unmet Transportation Needs (10/11/2023)  Utilities: Patient Unable To Answer (12/09/2023)  Recent Concern: Utilities - At Risk (09/25/2023)  Tobacco Use: High Risk (12/08/2023)    Readmission Risk Interventions     No data to display

## 2024-01-16 NOTE — Progress Notes (Signed)
 PROGRESS NOTE    Meghan Jarvis   FMW:[id number] DOB: 1976/10/06  DOA: 12/08/2023 Date of Service: 01/16/24 which is hospital day 39  PCP: [facility] course / significant events:   HPI: 48 year old female past medical history of alcohol abuse, cocaine abuse, homelessness, AIDS, atrial fibrillation, hypothyroidism, type 2 diabetes mellitus presented to [facility] with acute respiratory failure.  She was found down by a friend and could not wake her up.  She was found to be hypoxic in the 70s.  Since she was obtunded she was intubated and admitted by the critical care service.  He was started on antibiotics for pneumonia.  Urine toxicology positive for cocaine.  Patient extubated on 12/30.  12/12/2023.  Patient feeling okay.  Spoke with patient's friend [name] on the phone.  The patient is dating [name]'s son. 1/2.  Patient complained of chest pain.  Cardiac enzymes negative x 2 and EKG ok. 1/3.  Patient complaining of abdominal pain.RUQ US  negative.  MRI of the brain did not show any toxoplasmosis lesions but did show advanced cerebral atrophy. 1/4.  Patient complaining of headache.  Will give IV magnesium .  Patient walked 3 feet with physical therapy. 1/5.  Blood pressure this morning was low and 2 fluid boluses given.  Started on midodrine . 1/6.  Patient complaining of a lot of abdominal pain.  CT scan does not show any acute process and shows better aeration of right lower lung.  CT did show constipation.  Will start MiraLAX . 1/7.  Patient feeling good today.  Did not complain of any pain.  Hemoglobin 9.1, platelet count up at 311, white blood count 4.1  1/15.  We do not have a TOC plan at this point of time.  TOC reached out to [facility]PS.  Patient complains of right knee pain.  Uric acid 4.4.  X-ray negative. 1/16.  Patient feeling good and offers no complaints today. 1/17.  Patient complains of a slight headache. 1/18.  Patient complains of pain to nursing staff  but not to me. 1/19.  Patient in for Ace bandage for her right knee. 1/21.  I walked patient around the bed to the chair.  She used a walker.  Will give a trial of Flexeril  since patient's pain in her right flank. 1/22: Little agitated due to difficult blood drawn. 1/23: Patient refusing to answer orientation questions and becoming agitated.  Looks like does not have capacity to make decision but not cooperative for any proper evaluation. 1/24: Giving some IV fluid and also started on low-dose metoprolol  due to persistent mild tachycardia. 1/28: Psych reevaluated her, patient has capacity to make medical decision but need assistance with housing and finances due to cognitive decline.     Consultants:  Infectious disease   Procedures/Surgeries: none      ASSESSMENT & PLAN:   Acute metabolic encephalopathy-improved Mental status improved from admission but patient is not the best historian and does not capacity to make decisions at this time.  DSS referral by TOC.  MRI did not show any enhancing lesions but did show severe cerebral atrophy.  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 Mental status improved Have been seen by psychiatry and found to have capacity to make her own decision  Continue to monitor mental status TOC working on placement     abdominal pain Patient intermittently complains of abdominal pain.  Prior imaging is negative.  Patient having bowel movements.  Taper pain medication.   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  Infectious disease following.  Placed on PCP and MAI prophylaxis with atovaquone  and Zithromax .  Toxoplasma antibody IgG came back elevated and MRI of the brain does not show any enhancing lesions.  Infectious disease specialist started on Biktarvy .  Repeat viral load down to 34,300.  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  Platelet platelet count up in the normal range at 285, white blood cell count up in the normal range at 6.6, hemoglobin 8.1.   Protein-calorie malnutrition, severe Continue supplementation   Severe sepsis (HCC) Present on admission with tachycardia, tachypnea, fever, pneumonia, acute respiratory failure and acute metabolic encephalopathy.  Patient completed antibiotics.   Cocaine abuse (HCC) Patient has been counseled on cessation of cocaine use   Pneumonia of right lower lobe due to infectious organism Completed 5 days of antibiotic.   Acute hypoxic respiratory failure (HCC) Resolved.  Patient extubated on 12/30.  Patient currently breathing on room air.   Alcohol abuse Continue thiamine  multivitamin and folic acid    Uncontrolled type 2 diabetes mellitus with hypoglycemia, without long-term current use of insulin  (HCC) Hemoglobin A1c 6.0.    Generalized weakness Continue PT OT   Chest pain Atypical in nature.  Troponin x 2 negative.  EKG did not show any ST elevations.  Repeat EKG on 1/17 did not show any ST elevations.   AKI (acute kidney injury) (HCC) Continue monitoring Encouraged on adequate hydration   Right knee pain Patient asked for Ace bandage.  Right knee x-ray negative.  Uric acid 4.4.  Continue to monitor.   Underweight (BMI < 18.5) Last BMI 18.54   Diarrhea-resolved continue as needed Colace for any constipation    Borderline underweight based on BMI: Body mass index is 19.51 kg/m.  Underweight - under 18  overweight - 25 to 29 obese - 30 or more Class 1 obesity: BMI of 30.0 to 34 Class 2 obesity: BMI of 35.0 to 39 Class 3 obesity: BMI of 40.0 to 49 Super Morbid Obesity: BMI 50-59 Super-super Morbid Obesity: BMI 60+ Significantly low or high BMI is associated with higher medical risk.  Weight management advised as adjunct to other disease management and risk  reduction treatments    DVT prophylaxis: lovenox   IV fluids: no continuous IV fluids  Nutrition: dysphagia Central lines / invasive devices: none  Code Status: FULL CODE  ACP documentation reviewed:  none on file in [facility]  TOC needs: placement Barriers to dispo / significant pending items: placement              Subjective / Brief ROS:  Patient reports abdominal pain / cramping Denies CP/SOB.  Pain controlled.  Denies new weakness.  Tolerating diet.   Family Communication: none    Objective Findings:  Vitals:   01/15/24 2034 01/16/24 0434 01/16/24 0906 01/16/24 1200  BP: 106/80 91/68 109/74 100/60  Pulse: 81 86 85   Resp:   16   Temp: 98.1 F (36.7 C) 97.6 F (36.4 C) (!) 97.5 F (36.4 C)   TempSrc:      SpO2: 100% 100% 100%   Weight:      Height:       No intake or output data in the 24 hours ending 01/16/24 1616 Filed Weights   01/11/24 0033 01/13/24 0500 01/15/24 0500  Weight: 66.7  kg 57.1 kg 58.2 kg    Examination:  Physical Exam Constitutional:      General: She is not in acute distress. Cardiovascular:     Rate and Rhythm: Normal rate and regular rhythm.  Pulmonary:     Effort: Pulmonary effort is normal.     Breath sounds: Normal breath sounds.  Abdominal:     General: Bowel sounds are increased. There is no distension.     Palpations: Abdomen is soft.     Tenderness: There is no abdominal tenderness.  Skin:    General: Skin is warm and dry.  Neurological:     General: No focal deficit present.     Mental Status: She is alert. Mental status is at baseline.  Psychiatric:        Mood and Affect: Mood normal.        Behavior: Behavior normal.          Scheduled Medications:   atovaquone   1,500 mg Oral Q breakfast   azithromycin   1,200 mg Oral Weekly   bictegravir-emtricitabine -tenofovir  AF  1 tablet Oral Daily   DermaCerin   Topical BID   divalproex   500 mg Oral QHS   docusate sodium   100 mg Oral BID   DULoxetine   30  mg Oral QHS   enoxaparin  (LOVENOX ) injection  40 mg Subcutaneous QHS   feeding supplement  237 mL Oral QID   folic acid   1 mg Oral Daily   lidocaine   1 patch Transdermal Q24H   midodrine   10 mg Oral TID WC   multivitamin with minerals  1 tablet Oral QHS   pantoprazole   40 mg Oral BID   thiamine   100 mg Oral Daily    Continuous Infusions:   PRN Medications:  acetaminophen , cyclobenzaprine , dicyclomine , diphenhydrAMINE , ipratropium-albuterol , lactulose , nicotine  polacrilex, oxyCODONE , polyethylene glycol  Antimicrobials from admission:  Anti-infectives (From admission, onward)    Start     Dose/Rate Route Frequency Ordered Stop   12/25/23 0800  atovaquone  (MEPRON ) 750 MG/5ML suspension 1,500 mg        1,500 mg Oral Daily with breakfast 12/24/23 1558     12/24/23 1000  sulfamethoxazole -trimethoprim  (BACTRIM ) 400-80 MG per tablet 1 tablet  Status:  Discontinued        1 tablet Oral Daily 12/24/23 0749 12/24/23 1557   12/19/23 1400  azithromycin  (ZITHROMAX ) tablet 1,200 mg        1,200 mg Oral Weekly 12/18/23 1346     12/18/23 1000  sulfamethoxazole -trimethoprim  (BACTRIM  DS) 800-160 MG per tablet 1 tablet  Status:  Discontinued        1 tablet Oral Daily 12/17/23 1621 12/24/23 0748   12/14/23 1530  bictegravir-emtricitabine -tenofovir  AF (BIKTARVY ) 50-200-25 MG per tablet 1 tablet        1 tablet Oral Daily 12/14/23 1442     12/12/23 1000  amoxicillin -clavulanate (AUGMENTIN ) 875-125 MG per tablet 1 tablet        1 tablet Oral Every 12 hours 12/11/23 1345 12/12/23 2131   12/12/23 1000  azithromycin  (ZITHROMAX ) tablet 1,250 mg  Status:  Discontinued        1,200 mg Oral Weekly 12/11/23 1618 12/18/23 1346   12/11/23 2000  sulfamethoxazole -trimethoprim  (BACTRIM ) 400-80 MG per tablet 1 tablet  Status:  Discontinued        1 tablet Oral Daily 12/11/23 1618 12/17/23 1621   12/09/23 2200  azithromycin  (ZITHROMAX ) 500 mg in sodium chloride  0.9 % 250 mL IVPB  Status:  Discontinued  500  mg 250 mL/hr over 60 Minutes Intravenous Every 24 hours 12/09/23 0734 12/11/23 1618   12/09/23 2000  cefTRIAXone  (ROCEPHIN ) 2 g in sodium chloride  0.9 % 100 mL IVPB  Status:  Discontinued        2 g 200 mL/hr over 30 Minutes Intravenous Every 24 hours 12/09/23 0953 12/09/23 1840   12/09/23 2000  piperacillin -tazobactam (ZOSYN ) IVPB 3.375 g        3.375 g 12.5 mL/hr over 240 Minutes Intravenous Every 8 hours 12/09/23 1850 12/12/23 0037   12/09/23 0600  Ampicillin -Sulbactam (UNASYN ) 3 g in sodium chloride  0.9 % 100 mL IVPB  Status:  Discontinued        3 g 200 mL/hr over 30 Minutes Intravenous Every 6 hours 12/09/23 0442 12/09/23 0953   12/08/23 2215  cefTRIAXone  (ROCEPHIN ) 2 g in sodium chloride  0.9 % 100 mL IVPB        2 g 200 mL/hr over 30 Minutes Intravenous Once 12/08/23 2214 12/08/23 2308   12/08/23 2215  azithromycin  (ZITHROMAX ) 500 mg in sodium chloride  0.9 % 250 mL IVPB        500 mg 250 mL/hr over 60 Minutes Intravenous  Once 12/08/23 2214 12/09/23 0000           Data Reviewed:  I have personally reviewed the following...  CBC: Recent Labs  Lab 01/10/24 0451 01/11/24 0421 01/12/24 0511  WBC 5.2 5.8 7.4  NEUTROABS 1.8 1.4* 1.8  HGB 8.4* 8.3* 8.4*  HCT 25.9* 26.7* 27.0*  MCV 84.9 86.7 86.0  PLT 259 257 263   Basic Metabolic Panel: Recent Labs  Lab 01/10/24 0451 01/11/24 0421 01/12/24 0511  NA 140 139 138  K 3.9 4.1 4.1  CL 106 107 103  CO2 25 25 25   GLUCOSE 99 87 92  BUN 25* 23* 27*  CREATININE 0.70 0.75 0.66  CALCIUM  9.2 9.0 9.2   GFR: Estimated Creatinine Clearance: 78.2 mL/min (by C-G formula based on SCr of 0.66 mg/dL). Liver Function Tests: No results for input(s): AST, ALT, ALKPHOS, BILITOT, PROT, ALBUMIN in the last 168 hours.  No results for input(s): LIPASE, AMYLASE in the last 168 hours. No results for input(s): AMMONIA in the last 168 hours. Coagulation Profile: No results for input(s): INR, PROTIME in the last 168  hours. Cardiac Enzymes: No results for input(s): CKTOTAL, CKMB, CKMBINDEX, TROPONINI in the last 168 hours. BNP (last 3 results) No results for input(s): PROBNP in the last 8760 hours. HbA1C: No results for input(s): HGBA1C in the last 72 hours. CBG: No results for input(s): GLUCAP in the last 168 hours. Lipid Profile: No results for input(s): CHOL, HDL, LDLCALC, TRIG, CHOLHDL, LDLDIRECT in the last 72 hours. Thyroid Function Tests: No results for input(s): TSH, T4TOTAL, FREET4, T3FREE, THYROIDAB in the last 72 hours. Anemia Panel: No results for input(s): VITAMINB12, FOLATE, FERRITIN, TIBC, IRON , RETICCTPCT in the last 72 hours. Most Recent Urinalysis On File:     Component Value Date/Time   COLORURINE AMBER (A) 12/08/2023 2249   APPEARANCEUR HAZY (A) 12/08/2023 2249   APPEARANCEUR Clear 08/18/2014 1354   LABSPEC 1.026 12/08/2023 2249   LABSPEC 1.020 08/18/2014 1354   PHURINE 5.0 12/08/2023 2249   GLUCOSEU NEGATIVE 12/08/2023 2249   GLUCOSEU 50 mg/dL 90/91/7984 8645   HGBUR MODERATE (A) 12/08/2023 2249   BILIRUBINUR NEGATIVE 12/08/2023 2249   BILIRUBINUR Negative 08/18/2014 1354   KETONESUR 20 (A) 12/08/2023 2249   PROTEINUR 100 (A) 12/08/2023 2249   NITRITE NEGATIVE 12/08/2023 2249  LEUKOCYTESUR NEGATIVE 12/08/2023 2249   LEUKOCYTESUR Negative 08/18/2014 1354   Sepsis Labs: @LABRCNTIP (procalcitonin:4,lacticidven:4) Microbiology: No results found for this or any previous visit (from the past 240 hours).    Radiology Studies last 3 days: No results found.        Roxanna Mcever, DO [facility] 01/16/2024, 4:16 PM    Dictation software may have been used to generate the above note. Typos may occur and escape review in typed/dictated notes. Please contact Dr Marsa directly for clarity if needed.  Staff may message me via secure chat in Epic  but this may not receive an immediate response,  please  page me for urgent matters!  If 7PM-7AM, please contact night coverage [email or web address]

## 2024-01-16 NOTE — Plan of Care (Signed)

## 2024-01-16 NOTE — Plan of Care (Signed)
  Problem: Coping: Goal: Ability to adjust to condition or change in health will improve Outcome: Progressing   Problem: Fluid Volume: Goal: Ability to maintain a balanced intake and output will improve Outcome: Progressing   Problem: Health Behavior/Discharge Planning: Goal: Ability to identify and utilize available resources and services will improve Outcome: Progressing Goal: Ability to manage health-related needs will improve Outcome: Progressing   Problem: Metabolic: Goal: Ability to maintain appropriate glucose levels will improve Outcome: Progressing   Problem: Nutritional: Goal: Maintenance of adequate nutrition will improve Outcome: Progressing Goal: Progress toward achieving an optimal weight will improve Outcome: Progressing   Problem: Skin Integrity: Goal: Risk for impaired skin integrity will decrease Outcome: Progressing   Problem: Tissue Perfusion: Goal: Adequacy of tissue perfusion will improve Outcome: Progressing   Problem: Education: Goal: Knowledge of General Education information will improve Description: Including pain rating scale, medication(s)/side effects and non-pharmacologic comfort measures Outcome: Progressing   Problem: Health Behavior/Discharge Planning: Goal: Ability to manage health-related needs will improve Outcome: Progressing   Problem: Clinical Measurements: Goal: Ability to maintain clinical measurements within normal limits will improve Outcome: Progressing Goal: Will remain free from infection Outcome: Progressing Goal: Diagnostic test results will improve Outcome: Progressing Goal: Respiratory complications will improve Outcome: Progressing Goal: Cardiovascular complication will be avoided Outcome: Progressing   Problem: Activity: Goal: Risk for activity intolerance will decrease Outcome: Progressing   Problem: Elimination: Goal: Will not experience complications related to bowel motility Outcome: Progressing Goal:  Will not experience complications related to urinary retention Outcome: Progressing   Problem: Pain Management: Goal: General experience of comfort will improve Outcome: Progressing   Problem: Safety: Goal: Ability to remain free from injury will improve Outcome: Progressing   Problem: Activity: Goal: Ability to tolerate increased activity will improve Outcome: Progressing

## 2024-01-17 DIAGNOSIS — G9341 Metabolic encephalopathy: Secondary | ICD-10-CM | POA: Diagnosis not present

## 2024-01-17 NOTE — Progress Notes (Signed)
 Physical Therapy Treatment Patient Details Name: Meghan Jarvis MRN: [id number] DOB: Apr 11, 1976 Today's Date: 01/17/2024   History of Present Illness Pt is a 48 year old female admitted with [facility] with acute hypoxic respiratory failure, severe sepsis, community acquired PNA acute metabolic encephalopathy; She was intubated, extubated 12/30    PMH significant for alcohol abuse, cocaine abuse, homelessness, AIDS, atrial fibrillation, hypothyroidism, type 2 diabetes mellitus    PT Comments  Pt initially reluctant to mobilize due to c/o not feeling well with pain in Right hip/thigh. Pt educated on importance of participation and eventually agreed. Pt able to transfer with ModI/Supervision. Fair demonstration of gait training tolerance ~19ft with RW and S/CGA. Slight Right knee instability, pt able to self correct. Definitely appears safer using a RW or SPC for mobility. Will continue to progress as tolerated.    If plan is discharge home, recommend the following: Assistance with cooking/housework;Direct supervision/assist for financial management;Direct supervision/assist for medications management;Assist for transportation;Help with stairs or ramp for entrance;Supervision due to cognitive status;A little help with bathing/dressing/bathroom   Can travel by private vehicle     Yes  Equipment Recommendations  Rolling walker (2 wheels);Rollator (4 wheels)    Recommendations for Other Services       Precautions / Restrictions Precautions Precautions: Fall Restrictions Weight Bearing Restrictions Per Provider Order: No     Mobility  Bed Mobility Overal bed mobility: Modified Independent Bed Mobility: Sit to Supine Rolling: Modified independent (Device/Increase time) Sidelying to sit: Supervision Supine to sit: Modified independent (Device/Increase time) Sit to supine: Modified independent (Device/Increase time)        Transfers Overall transfer level: Needs assistance Equipment used:  Rolling walker (2 wheels), None Transfers: Sit to/from Stand Sit to Stand: Supervision           General transfer comment: supervision and cues for safety as she tries to leave walker at end of bed when returning to recliner    Ambulation/Gait Ambulation/Gait assistance: Contact guard assist Gait Distance (Feet): 80 Feet Assistive device: Rolling walker (2 wheels) Gait Pattern/deviations: Step-to pattern, Antalgic, Knees buckling, Drifts right/left Gait velocity: dec     General Gait Details: Favoring of R LE due to pain, slight buckling during gait, able to self correct. Safer with support of RW   Stairs             Wheelchair Mobility     Tilt Bed    Modified Rankin (Stroke Patients Only)       Balance Overall balance assessment: Needs assistance Sitting-balance support: No upper extremity supported, Feet supported Sitting balance-Leahy Scale: Good     Standing balance support: Bilateral upper extremity supported, During functional activity, Single extremity supported Standing balance-Leahy Scale: Fair Standing balance comment: encouraged to use RW to support R LE weakness and discomfort                            Cognition Arousal: Alert Behavior During Therapy: WFL for tasks assessed/performed Overall Cognitive Status: No family/caregiver present to determine baseline cognitive functioning                         Following Commands: Follows multi-step commands consistently       General Comments: pleasant, followed commands, self limiting due to pain        Exercises      General Comments General comments (skin integrity, edema, etc.):  (Pt educated on role  of PT and benefits of frequent mobility while hospitalized. Pt agreeable after explaination.)      Pertinent Vitals/Pain Pain Assessment Pain Assessment: 0-10 Pain Score: 7  Pain Location: Right hip/leg Pain Descriptors / Indicators: Aching, Discomfort,  Grimacing Pain Intervention(s): Patient requesting pain meds-RN notified    Home Living                          Prior Function            PT Goals (current goals can now be found in the care plan section) Acute Rehab PT Goals Patient Stated Goal: to go home Progress towards PT goals: Progressing toward goals    Frequency    Min 1X/week      PT Plan      Co-evaluation              AM-PAC PT 6 Clicks Mobility   Outcome Measure  Help needed turning from your back to your side while in a flat bed without using bedrails?: None Help needed moving from lying on your back to sitting on the side of a flat bed without using bedrails?: None Help needed moving to and from a bed to a chair (including a wheelchair)?: A Little Help needed standing up from a chair using your arms (e.g., wheelchair or bedside chair)?: A Little Help needed to walk in hospital room?: A Little Help needed climbing 3-5 steps with a railing? : A Little 6 Click Score: 20    End of Session Equipment Utilized During Treatment: Gait belt Activity Tolerance: Patient tolerated treatment well;Patient limited by pain Patient left: in chair;with call bell/phone within reach;with chair alarm set Nurse Communication: Mobility status;Patient requests pain meds PT Visit Diagnosis: Other abnormalities of gait and mobility (R26.89);Difficulty in walking, not elsewhere classified (R26.2);Muscle weakness (generalized) (M62.81)     Time: 8698-8681 PT Time Calculation (min) (ACUTE ONLY): 17 min  Charges:    $Therapeutic Activity: 8-22 mins PT General Charges $$ ACUTE PT VISIT: 1 Visit                    Darice Bohr, PTA  Darice JAYSON Bohr 01/17/2024, 1:42 PM

## 2024-01-17 NOTE — Plan of Care (Signed)
  Problem: Nutritional: Goal: Maintenance of adequate nutrition will improve Outcome: Progressing   Problem: Skin Integrity: Goal: Risk for impaired skin integrity will decrease Outcome: Progressing   Problem: Education: Goal: Knowledge of General Education information will improve Description: Including pain rating scale, medication(s)/side effects and non-pharmacologic comfort measures Outcome: Progressing   Problem: Activity: Goal: Risk for activity intolerance will decrease Outcome: Progressing   Problem: Nutrition: Goal: Adequate nutrition will be maintained Outcome: Progressing   Problem: Pain Management: Goal: General experience of comfort will improve Outcome: Progressing   Problem: Safety: Goal: Ability to remain free from injury will improve Outcome: Progressing

## 2024-01-17 NOTE — TOC Progression Note (Signed)
 Transition of Care [facility]) - Progression Note    Patient Details  Name: Meghan Jarvis MRN: [id number] Date of Birth: [birth year]  Transition of Care [facility]) CM/SW Contact  Ladene Lady, LCSW Phone Number: 01/17/2024, 9:03 AM  Clinical Narrative:   Referral [facility] to [facility] as they stated they did not receive.         Expected Discharge Plan and Services         Expected Discharge Date: 01/10/24                                     Social Determinants of Health (SDOH) Interventions SDOH Screenings   Food Insecurity: Patient Unable To Answer (12/09/2023)  Recent Concern: Food Insecurity - Food Insecurity Present (09/26/2023)  Housing: Patient Unable To Answer (12/09/2023)  Recent Concern: Housing - Medium Risk (09/26/2023)  Transportation Needs: Patient Unable To Answer (12/09/2023)  Recent Concern: Transportation Needs - Unmet Transportation Needs (10/11/2023)  Utilities: Patient Unable To Answer (12/09/2023)  Recent Concern: Utilities - At Risk (09/25/2023)  Tobacco Use: High Risk (12/08/2023)    Readmission Risk Interventions     No data to display

## 2024-01-17 NOTE — Progress Notes (Signed)
 PROGRESS NOTE    Meghan Jarvis   FMW:[id number] DOB: 1976/11/26  DOA: 12/08/2023 Date of Service: 01/17/24 which is hospital day 40  PCP: [facility] course / significant events:   HPI: 48 year old female past medical history of alcohol abuse, cocaine abuse, homelessness, AIDS, atrial fibrillation, hypothyroidism, type 2 diabetes mellitus presented to [facility] with acute respiratory failure.  She was found down by a friend and could not wake her up.  She was found to be hypoxic in the 70s.  Since she was obtunded she was intubated and admitted by the critical care service.  He was started on antibiotics for pneumonia.  Urine toxicology positive for cocaine.  Patient extubated on 12/30.  12/12/2023.  Patient feeling okay.  Spoke with patient's friend [name] on the phone.  The patient is dating [name]'s son. 1/2.  Patient complained of chest pain.  Cardiac enzymes negative x 2 and EKG ok. 1/3.  Patient complaining of abdominal pain.RUQ US  negative.  MRI of the brain did not show any toxoplasmosis lesions but did show advanced cerebral atrophy. 1/4.  Patient complaining of headache.  Will give IV magnesium .  Patient walked 3 feet with physical therapy. 1/5.  Blood pressure this morning was low and 2 fluid boluses given.  Started on midodrine . 1/6.  Patient complaining of a lot of abdominal pain.  CT scan does not show any acute process and shows better aeration of right lower lung.  CT did show constipation.  Will start MiraLAX . 1/7.  Patient feeling good today.  Did not complain of any pain.  Hemoglobin 9.1, platelet count up at 311, white blood count 4.1  1/15.  We do not have a TOC plan at this point of time.  TOC reached out to [facility].  Patient complains of right knee pain.  Uric acid 4.4.  X-ray negative. 1/16.  Patient feeling good and offers no complaints today. 1/17.  Patient complains of a slight headache. 1/18.  Patient complains of pain to nursing staff  but not to me. 1/19.  Patient in for Ace bandage for her right knee. 1/21.  I walked patient around the bed to the chair.  She used a walker.  Will give a trial of Flexeril  since patient's pain in her right flank. 1/22: Little agitated due to difficult blood drawn. 1/23: Patient refusing to answer orientation questions and becoming agitated.  Looks like does not have capacity to make decision but not cooperative for any proper evaluation. 1/24: Giving some IV fluid and also started on low-dose metoprolol  due to persistent mild tachycardia. 1/28: Psych reevaluated her, patient has capacity to make medical decision but need assistance with housing and finances due to cognitive decline.     Consultants:  Infectious disease   Procedures/Surgeries: none      ASSESSMENT & PLAN:   Acute metabolic encephalopathy-improved Mental status improved from admission but patient is not the best historian and does not capacity to make decisions at this time.  DSS referral by TOC.  MRI did not show any enhancing lesions but did show severe cerebral atrophy.  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 Mental status improved Have been seen by psychiatry and found to have capacity to make her own decision  Continue to monitor mental status TOC working on placement     abdominal pain Patient intermittently complains of abdominal pain.  Prior imaging is negative.  Patient having bowel movements.  Taper pain medication.   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  Infectious disease following.  Placed on PCP and MAI prophylaxis with atovaquone  and Zithromax .  Toxoplasma antibody IgG came back elevated and MRI of the brain does not show any enhancing lesions.  Infectious disease specialist started on Biktarvy .  Repeat viral load down to 34,300.  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  Platelet platelet count up in the normal range at 285, white blood cell count up in the normal range at 6.6, hemoglobin 8.1.   Protein-calorie malnutrition, severe Continue supplementation   Severe sepsis (HCC) Present on admission with tachycardia, tachypnea, fever, pneumonia, acute respiratory failure and acute metabolic encephalopathy.  Patient completed antibiotics.   Cocaine abuse (HCC) Patient has been counseled on cessation of cocaine use   Pneumonia of right lower lobe due to infectious organism Completed 5 days of antibiotic.   Acute hypoxic respiratory failure (HCC) Resolved.  Patient extubated on 12/30.  Patient currently breathing on room air.   Alcohol abuse Continue thiamine  multivitamin and folic acid    Uncontrolled type 2 diabetes mellitus with hypoglycemia, without long-term current use of insulin  (HCC) Hemoglobin A1c 6.0.    Generalized weakness Continue PT OT   Chest pain Atypical in nature.  Troponin x 2 negative.  EKG did not show any ST elevations.  Repeat EKG on 1/17 did not show any ST elevations.   AKI (acute kidney injury) (HCC) Continue monitoring Encouraged on adequate hydration   Right knee pain Patient asked for Ace bandage.  Right knee x-ray negative.  Uric acid 4.4.  Continue to monitor.   Underweight (BMI < 18.5) Last BMI 18.54   Diarrhea-resolved continue as needed Colace for any constipation    Borderline underweight based on BMI: Body mass index is 19.51 kg/m.  Underweight - under 18  overweight - 25 to 29 obese - 30 or more Class 1 obesity: BMI of 30.0 to 34 Class 2 obesity: BMI of 35.0 to 39 Class 3 obesity: BMI of 40.0 to 49 Super Morbid Obesity: BMI 50-59 Super-super Morbid Obesity: BMI 60+ Significantly low or high BMI is associated with higher medical risk.  Weight management advised as adjunct to other disease management and risk  reduction treatments    DVT prophylaxis: lovenox   IV fluids: no continuous IV fluids  Nutrition: dysphagia Central lines / invasive devices: none  Code Status: FULL CODE  ACP documentation reviewed:  none on file in [facility]  TOC needs: placement Barriers to dispo / significant pending items: placement              Subjective / Brief ROS:  Patient reports no concerns today No concerns from RN   Family Communication: none    Objective Findings:  Vitals:   01/16/24 2119 01/17/24 0500 01/17/24 0605 01/17/24 0855  BP: 113/72  98/64 104/70  Pulse: 96  87 87  Resp: 16  16 18   Temp: 98.8 F (37.1 C)  98.6 F (37 C) 97.9 F (36.6 C)  TempSrc:      SpO2: 99%  100% 100%  Weight:  56.8 kg    Height:       No intake or output data in the 24 hours ending 01/17/24 1347 Filed Weights   01/13/24 0500 01/15/24 0500 01/17/24 0500  Weight: 57.1 kg 58.2 kg 56.8 kg    Examination:  Physical Exam Constitutional:      General: She is not in acute distress. Pulmonary:     Effort: Pulmonary effort is normal.  Abdominal:     General: Bowel sounds are increased.  Neurological:     General: No focal deficit present.     Mental Status: She is alert. Mental status is at baseline.  Psychiatric:        Mood and Affect: Mood normal.        Behavior: Behavior normal.          Scheduled Medications:   atovaquone   1,500 mg Oral Q breakfast   azithromycin   1,200 mg Oral Weekly   bictegravir-emtricitabine -tenofovir  AF  1 tablet Oral Daily   DermaCerin   Topical BID   divalproex   500 mg Oral QHS   docusate sodium   100 mg Oral BID   DULoxetine   30 mg Oral QHS   enoxaparin  (LOVENOX ) injection  40 mg Subcutaneous QHS   feeding supplement  237 mL Oral QID   folic acid   1 mg Oral Daily   lidocaine   1 patch Transdermal Q24H   midodrine   10 mg Oral TID WC   multivitamin with minerals  1 tablet Oral QHS   pantoprazole   40 mg Oral BID   thiamine   100 mg Oral Daily     Continuous Infusions:   PRN Medications:  acetaminophen , cyclobenzaprine , dicyclomine , diphenhydrAMINE , ipratropium-albuterol , lactulose , nicotine  polacrilex, oxyCODONE , polyethylene glycol  Antimicrobials from admission:  Anti-infectives (From admission, onward)    Start     Dose/Rate Route Frequency Ordered Stop   12/25/23 0800  atovaquone  (MEPRON ) 750 MG/5ML suspension 1,500 mg        1,500 mg Oral Daily with breakfast 12/24/23 1558     12/24/23 1000  sulfamethoxazole -trimethoprim  (BACTRIM ) 400-80 MG per tablet 1 tablet  Status:  Discontinued        1 tablet Oral Daily 12/24/23 0749 12/24/23 1557   12/19/23 1400  azithromycin  (ZITHROMAX ) tablet 1,200 mg        1,200 mg Oral Weekly 12/18/23 1346     12/18/23 1000  sulfamethoxazole -trimethoprim  (BACTRIM  DS) 800-160 MG per tablet 1 tablet  Status:  Discontinued        1 tablet Oral Daily 12/17/23 1621 12/24/23 0748   12/14/23 1530  bictegravir-emtricitabine -tenofovir  AF (BIKTARVY ) 50-200-25 MG per tablet 1 tablet        1 tablet Oral Daily 12/14/23 1442     12/12/23 1000  amoxicillin -clavulanate (AUGMENTIN ) 875-125 MG per tablet 1 tablet        1 tablet Oral Every 12 hours 12/11/23 1345 12/12/23 2131   12/12/23 1000  azithromycin  (ZITHROMAX ) tablet 1,250 mg  Status:  Discontinued        1,200 mg Oral Weekly 12/11/23 1618 12/18/23 1346   12/11/23 2000  sulfamethoxazole -trimethoprim  (BACTRIM ) 400-80 MG per tablet 1 tablet  Status:  Discontinued        1 tablet Oral Daily 12/11/23 1618 12/17/23 1621   12/09/23 2200  azithromycin  (ZITHROMAX ) 500 mg in sodium chloride  0.9 % 250 mL IVPB  Status:  Discontinued        500 mg 250 mL/hr over 60 Minutes Intravenous Every 24 hours 12/09/23 0734 12/11/23 1618   12/09/23 2000  cefTRIAXone  (ROCEPHIN ) 2 g in sodium chloride  0.9 % 100 mL IVPB  Status:  Discontinued        2 g 200 mL/hr over 30 Minutes Intravenous Every 24 hours 12/09/23 0953 12/09/23 1840   12/09/23 2000  piperacillin -tazobactam  (  ZOSYN ) IVPB 3.375 g        3.375 g 12.5 mL/hr over 240 Minutes Intravenous Every 8 hours 12/09/23 1850 12/12/23 0037   12/09/23 0600  Ampicillin -Sulbactam (UNASYN ) 3 g in sodium chloride  0.9 % 100 mL IVPB  Status:  Discontinued        3 g 200 mL/hr over 30 Minutes Intravenous Every 6 hours 12/09/23 0442 12/09/23 0953   12/08/23 2215  cefTRIAXone  (ROCEPHIN ) 2 g in sodium chloride  0.9 % 100 mL IVPB        2 g 200 mL/hr over 30 Minutes Intravenous Once 12/08/23 2214 12/08/23 2308   12/08/23 2215  azithromycin  (ZITHROMAX ) 500 mg in sodium chloride  0.9 % 250 mL IVPB        500 mg 250 mL/hr over 60 Minutes Intravenous  Once 12/08/23 2214 12/09/23 0000           Data Reviewed:  I have personally reviewed the following...  CBC: Recent Labs  Lab 01/11/24 0421 01/12/24 0511  WBC 5.8 7.4  NEUTROABS 1.4* 1.8  HGB 8.3* 8.4*  HCT 26.7* 27.0*  MCV 86.7 86.0  PLT 257 263   Basic Metabolic Panel: Recent Labs  Lab 01/11/24 0421 01/12/24 0511  NA 139 138  K 4.1 4.1  CL 107 103  CO2 25 25  GLUCOSE 87 92  BUN 23* 27*  CREATININE 0.75 0.66  CALCIUM  9.0 9.2   GFR: Estimated Creatinine Clearance: 76.3 mL/min (by C-G formula based on SCr of 0.66 mg/dL). Liver Function Tests: No results for input(s): AST, ALT, ALKPHOS, BILITOT, PROT, ALBUMIN in the last 168 hours.  No results for input(s): LIPASE, AMYLASE in the last 168 hours. No results for input(s): AMMONIA in the last 168 hours. Coagulation Profile: No results for input(s): INR, PROTIME in the last 168 hours. Cardiac Enzymes: No results for input(s): CKTOTAL, CKMB, CKMBINDEX, TROPONINI in the last 168 hours. BNP (last 3 results) No results for input(s): PROBNP in the last 8760 hours. HbA1C: No results for input(s): HGBA1C in the last 72 hours. CBG: No results for input(s): GLUCAP in the last 168 hours. Lipid Profile: No results for input(s): CHOL, HDL, LDLCALC, TRIG, CHOLHDL,  LDLDIRECT in the last 72 hours. Thyroid Function Tests: No results for input(s): TSH, T4TOTAL, FREET4, T3FREE, THYROIDAB in the last 72 hours. Anemia Panel: No results for input(s): VITAMINB12, FOLATE, FERRITIN, TIBC, IRON , RETICCTPCT in the last 72 hours. Most Recent Urinalysis On File:     Component Value Date/Time   COLORURINE AMBER (A) 12/08/2023 2249   APPEARANCEUR HAZY (A) 12/08/2023 2249   APPEARANCEUR Clear 08/18/2014 1354   LABSPEC 1.026 12/08/2023 2249   LABSPEC 1.020 08/18/2014 1354   PHURINE 5.0 12/08/2023 2249   GLUCOSEU NEGATIVE 12/08/2023 2249   GLUCOSEU 50 mg/dL 90/91/7984 8645   HGBUR MODERATE (A) 12/08/2023 2249   BILIRUBINUR NEGATIVE 12/08/2023 2249   BILIRUBINUR Negative 08/18/2014 1354   KETONESUR 20 (A) 12/08/2023 2249   PROTEINUR 100 (A) 12/08/2023 2249   NITRITE NEGATIVE 12/08/2023 2249   LEUKOCYTESUR NEGATIVE 12/08/2023 2249   LEUKOCYTESUR Negative 08/18/2014 1354   Sepsis Labs: @LABRCNTIP (procalcitonin:4,lacticidven:4) Microbiology: No results found for this or any previous visit (from the past 240 hours).    Radiology Studies last 3 days: No results found.        Ranger Petrich, DO [facility] 01/17/2024, 1:47 PM    Dictation software may have been used to generate the above note. Typos may occur and escape review in typed/dictated notes. Please contact  Dr Marsa directly for clarity if needed.  Staff may message me via secure chat in Epic  but this may not receive an immediate response,  please page me for urgent matters!  If 7PM-7AM, please contact night coverage [email or web address]

## 2024-01-17 NOTE — TOC Progression Note (Signed)
 Transition of Care [facility]) - Progression Note    Patient Details  Name: Meghan Jarvis MRN: [id number] Date of Birth: 02-05-1976  [facility] CM/SW Contact  Ladene Lady, LCSW Phone Number: 01/17/2024, 3:12 PM  Clinical Narrative:   [facility].         Expected Discharge Plan and Services         Expected Discharge Date: 01/10/24                                     Social Determinants of Health (SDOH) Interventions SDOH Screenings   Food Insecurity: Patient Unable To Answer (12/09/2023)  Recent Concern: Food Insecurity - Food Insecurity Present (09/26/2023)  Housing: Patient Unable To Answer (12/09/2023)  Recent Concern: Housing - Medium Risk (09/26/2023)  Transportation Needs: Patient Unable To Answer (12/09/2023)  Recent Concern: Transportation Needs - Unmet Transportation Needs (10/11/2023)  Utilities: Patient Unable To Answer (12/09/2023)  Recent Concern: Utilities - At Risk (09/25/2023)  Tobacco Use: High Risk (12/08/2023)    Readmission Risk Interventions     No data to display

## 2024-01-17 NOTE — Plan of Care (Signed)
  Problem: Coping: Goal: Ability to adjust to condition or change in health will improve Outcome: Progressing   Problem: Fluid Volume: Goal: Ability to maintain a balanced intake and output will improve Outcome: Progressing   Problem: Health Behavior/Discharge Planning: Goal: Ability to identify and utilize available resources and services will improve Outcome: Progressing Goal: Ability to manage health-related needs will improve Outcome: Progressing

## 2024-01-18 DIAGNOSIS — G9341 Metabolic encephalopathy: Secondary | ICD-10-CM | POA: Diagnosis not present

## 2024-01-18 DIAGNOSIS — J9601 Acute respiratory failure with hypoxia: Secondary | ICD-10-CM | POA: Diagnosis not present

## 2024-01-18 DIAGNOSIS — B20 Human immunodeficiency virus [HIV] disease: Secondary | ICD-10-CM | POA: Diagnosis not present

## 2024-01-18 NOTE — Progress Notes (Signed)
 PROGRESS NOTE    Meghan Jarvis   FMW:[id number] DOB: 1976/10/05  DOA: 12/08/2023 Date of Service: 01/18/24 which is hospital day 41  PCP: Healthcare, [facility] course / significant events:   HPI: 48 year old female past medical history of alcohol abuse, cocaine abuse, homelessness, AIDS, atrial fibrillation, hypothyroidism, type 2 diabetes mellitus presented to [facility] with acute respiratory failure.  She was found down by a friend and could not wake her up.  She was found to be hypoxic in the 70s.  Since she was obtunded she was intubated and admitted by the critical care service.  He was started on antibiotics for pneumonia.  Urine toxicology positive for cocaine.  Patient extubated on 12/30.  12/12/2023.  Patient feeling okay.  Spoke with patient's friend Meghan Jarvis on the phone.  The patient is dating Meghan Jarvis's son. 1/2.  Patient complained of chest pain.  Cardiac enzymes negative x 2 and EKG ok. 1/3.  Patient complaining of abdominal pain.RUQ US  negative.  MRI of the brain did not show any toxoplasmosis lesions but did show advanced cerebral atrophy. 1/4.  Patient complaining of headache.  Will give IV magnesium .  Patient walked 3 feet with physical therapy. 1/5.  Blood pressure this morning was low and 2 fluid boluses given.  Started on midodrine . 1/6.  Patient complaining of a lot of abdominal pain.  CT scan does not show any acute process and shows better aeration of right lower lung.  CT did show constipation.  Will start MiraLAX . 1/7.  Patient feeling good today.  Did not complain of any pain.  Hemoglobin 9.1, platelet count up at 311, white blood count 4.1  1/15.  We do not have a TOC plan at this point of time.  TOC reached out to APS.  Patient complains of right knee pain.  Uric acid 4.4.  X-ray negative. 1/16.  Patient feeling good and offers no complaints today. 1/17.  Patient complains of a slight headache. 1/18.  Patient complains of pain to nursing staff  but not to me. 1/19.  Patient in for Ace bandage for her right knee. 1/21.  I walked patient around the bed to the chair.  She used a walker.  Will give a trial of Flexeril  since patient's pain in her right flank. 1/22: Little agitated due to difficult blood drawn. 1/23: Patient refusing to answer orientation questions and becoming agitated.  Looks like does not have capacity to make decision but not cooperative for any proper evaluation. 1/24: Giving some IV fluid and also started on low-dose metoprolol  due to persistent mild tachycardia. 1/28: Psych reevaluated her, patient has capacity to make medical decision but need assistance with housing and finances due to cognitive decline.     Consultants:  Infectious disease   Procedures/Surgeries: none      ASSESSMENT & PLAN:   Acute metabolic encephalopathy-improved Mental status improved from admission but patient is not the best historian and does not capacity to make decisions at this time.  DSS referral by TOC.  MRI did not show any enhancing lesions but did show severe cerebral atrophy.  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 Mental status improved Have been seen by psychiatry and found to have capacity to make her own decision  Continue to monitor mental status TOC working on placement     abdominal pain Patient intermittently complains of abdominal pain.  Prior imaging is negative.  Patient having bowel movements.  Taper pain medication.   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  Infectious disease following.  Placed on PCP and MAI prophylaxis with atovaquone  and Zithromax .  Toxoplasma antibody IgG came back elevated and MRI of the brain does not show any enhancing lesions.  Infectious disease specialist started on Biktarvy .  Repeat viral load down to 34,300.  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  Platelet platelet count up in the normal range at 285, white blood cell count up in the normal range at 6.6, hemoglobin 8.1.   Protein-calorie malnutrition, severe Continue supplementation   Severe sepsis (HCC) Present on admission with tachycardia, tachypnea, fever, pneumonia, acute respiratory failure and acute metabolic encephalopathy.  Patient completed antibiotics.   Cocaine abuse (HCC) Patient has been counseled on cessation of cocaine use   Pneumonia of right lower lobe due to infectious organism Completed 5 days of antibiotic.   Acute hypoxic respiratory failure (HCC) Resolved.  Patient extubated on 12/30.  Patient currently breathing on room air.   Alcohol abuse Continue thiamine  multivitamin and folic acid    Uncontrolled type 2 diabetes mellitus with hypoglycemia, without long-term current use of insulin  (HCC) Hemoglobin A1c 6.0.    Generalized weakness Continue PT OT   Chest pain Atypical in nature.  Troponin x 2 negative.  EKG did not show any ST elevations.  Repeat EKG on 1/17 did not show any ST elevations.   AKI (acute kidney injury) (HCC) Continue monitoring Encouraged on adequate hydration   Right knee pain Patient asked for Ace bandage.  Right knee x-ray negative.  Uric acid 4.4.  Continue to monitor.   Underweight (BMI < 18.5) Last BMI 18.54   Diarrhea-resolved continue as needed Colace for any constipation    Borderline underweight based on BMI: Body mass index is 19.51 kg/m.  Underweight - under 18  overweight - 25 to 29 obese - 30 or more Class 1 obesity: BMI of 30.0 to 34 Class 2 obesity: BMI of 35.0 to 39 Class 3 obesity: BMI of 40.0 to 49 Super Morbid Obesity: BMI 50-59 Super-super Morbid Obesity: BMI 60+ Significantly low or high BMI is associated with higher medical risk.  Weight management advised as adjunct to other disease management and risk  reduction treatments    DVT prophylaxis: lovenox   IV fluids: no continuous IV fluids  Nutrition: dysphagia Central lines / invasive devices: none  Code Status: FULL CODE  ACP documentation reviewed:  none on file in [facility]  TOC needs: placement Barriers to dispo / significant pending items: placement              Subjective / Brief ROS:  Patient reports no concerns today No concerns from RN   Family Communication: none    Objective Findings:  Vitals:   01/17/24 1757 01/17/24 2223 01/18/24 0501 01/18/24 0829  BP: 108/77 99/64 104/77 96/69  Pulse: (!) 45 98 96 82  Resp: 18 16 16 18   Temp: 98 F (36.7 C) 98.9 F (37.2 C) 98.7 F (37.1 C) 97.9 F (36.6 C)  TempSrc:  Oral Oral   SpO2: 100% 100% 100% 100%  Weight:      Height:        Intake/Output Summary (Last 24 hours) at 01/18/2024 1448 Last data filed at 01/18/2024 1300 Gross per 24 hour  Intake 0 ml  Output --  Net 0 ml  Filed Weights   01/13/24 0500 01/15/24 0500 01/17/24 0500  Weight: 57.1 kg 58.2 kg 56.8 kg    Examination:  Physical Exam Constitutional:      General: She is not in acute distress. Pulmonary:     Effort: Pulmonary effort is normal.  Neurological:     Mental Status: She is alert. Mental status is at baseline.  Psychiatric:        Mood and Affect: Mood normal.        Behavior: Behavior normal.          Scheduled Medications:   atovaquone   1,500 mg Oral Q breakfast   azithromycin   1,200 mg Oral Weekly   bictegravir-emtricitabine -tenofovir  AF  1 tablet Oral Daily   DermaCerin   Topical BID   divalproex   500 mg Oral QHS   docusate sodium   100 mg Oral BID   DULoxetine   30 mg Oral QHS   enoxaparin  (LOVENOX ) injection  40 mg Subcutaneous QHS   feeding supplement  237 mL Oral QID   folic acid   1 mg Oral Daily   lidocaine   1 patch Transdermal Q24H   midodrine   10 mg Oral TID WC   multivitamin with minerals  1 tablet Oral QHS   pantoprazole   40 mg Oral BID   thiamine    100 mg Oral Daily    Continuous Infusions:   PRN Medications:  acetaminophen , cyclobenzaprine , dicyclomine , diphenhydrAMINE , ipratropium-albuterol , lactulose , nicotine  polacrilex, oxyCODONE , polyethylene glycol  Antimicrobials from admission:  Anti-infectives (From admission, onward)    Start     Dose/Rate Route Frequency Ordered Stop   12/25/23 0800  atovaquone  (MEPRON ) 750 MG/5ML suspension 1,500 mg        1,500 mg Oral Daily with breakfast 12/24/23 1558     12/24/23 1000  sulfamethoxazole -trimethoprim  (BACTRIM ) 400-80 MG per tablet 1 tablet  Status:  Discontinued        1 tablet Oral Daily 12/24/23 0749 12/24/23 1557   12/19/23 1400  azithromycin  (ZITHROMAX ) tablet 1,200 mg        1,200 mg Oral Weekly 12/18/23 1346     12/18/23 1000  sulfamethoxazole -trimethoprim  (BACTRIM  DS) 800-160 MG per tablet 1 tablet  Status:  Discontinued        1 tablet Oral Daily 12/17/23 1621 12/24/23 0748   12/14/23 1530  bictegravir-emtricitabine -tenofovir  AF (BIKTARVY ) 50-200-25 MG per tablet 1 tablet        1 tablet Oral Daily 12/14/23 1442     12/12/23 1000  amoxicillin -clavulanate (AUGMENTIN ) 875-125 MG per tablet 1 tablet        1 tablet Oral Every 12 hours 12/11/23 1345 12/12/23 2131   12/12/23 1000  azithromycin  (ZITHROMAX ) tablet 1,250 mg  Status:  Discontinued        1,200 mg Oral Weekly 12/11/23 1618 12/18/23 1346   12/11/23 2000  sulfamethoxazole -trimethoprim  (BACTRIM ) 400-80 MG per tablet 1 tablet  Status:  Discontinued        1 tablet Oral Daily 12/11/23 1618 12/17/23 1621   12/09/23 2200  azithromycin  (ZITHROMAX ) 500 mg in sodium chloride  0.9 % 250 mL IVPB  Status:  Discontinued        500 mg 250 mL/hr over 60 Minutes Intravenous Every 24 hours 12/09/23 0734 12/11/23 1618   12/09/23 2000  cefTRIAXone  (ROCEPHIN ) 2 g in sodium chloride  0.9 % 100 mL IVPB  Status:  Discontinued        2 g 200 mL/hr over 30 Minutes Intravenous Every 24 hours 12/09/23 0953 12/09/23 1840   12/09/23  2000   piperacillin -tazobactam (ZOSYN ) IVPB 3.375 g        3.375 g 12.5 mL/hr over 240 Minutes Intravenous Every 8 hours 12/09/23 1850 12/12/23 0037   12/09/23 0600  Ampicillin -Sulbactam (UNASYN ) 3 g in sodium chloride  0.9 % 100 mL IVPB  Status:  Discontinued        3 g 200 mL/hr over 30 Minutes Intravenous Every 6 hours 12/09/23 0442 12/09/23 0953   12/08/23 2215  cefTRIAXone  (ROCEPHIN ) 2 g in sodium chloride  0.9 % 100 mL IVPB        2 g 200 mL/hr over 30 Minutes Intravenous Once 12/08/23 2214 12/08/23 2308   12/08/23 2215  azithromycin  (ZITHROMAX ) 500 mg in sodium chloride  0.9 % 250 mL IVPB        500 mg 250 mL/hr over 60 Minutes Intravenous  Once 12/08/23 2214 12/09/23 0000           Data Reviewed:  I have personally reviewed the following...  CBC: Recent Labs  Lab 01/12/24 0511  WBC 7.4  NEUTROABS 1.8  HGB 8.4*  HCT 27.0*  MCV 86.0  PLT 263   Basic Metabolic Panel: Recent Labs  Lab 01/12/24 0511  NA 138  K 4.1  CL 103  CO2 25  GLUCOSE 92  BUN 27*  CREATININE 0.66  CALCIUM  9.2   GFR: Estimated Creatinine Clearance: 76.3 mL/min (by C-G formula based on SCr of 0.66 mg/dL). Liver Function Tests: No results for input(s): AST, ALT, ALKPHOS, BILITOT, PROT, ALBUMIN in the last 168 hours.  No results for input(s): LIPASE, AMYLASE in the last 168 hours. No results for input(s): AMMONIA in the last 168 hours. Coagulation Profile: No results for input(s): INR, PROTIME in the last 168 hours. Cardiac Enzymes: No results for input(s): CKTOTAL, CKMB, CKMBINDEX, TROPONINI in the last 168 hours. BNP (last 3 results) No results for input(s): PROBNP in the last 8760 hours. HbA1C: No results for input(s): HGBA1C in the last 72 hours. CBG: No results for input(s): GLUCAP in the last 168 hours. Lipid Profile: No results for input(s): CHOL, HDL, LDLCALC, TRIG, CHOLHDL, LDLDIRECT in the last 72 hours. Thyroid Function Tests: No  results for input(s): TSH, T4TOTAL, FREET4, T3FREE, THYROIDAB in the last 72 hours. Anemia Panel: No results for input(s): VITAMINB12, FOLATE, FERRITIN, TIBC, IRON , RETICCTPCT in the last 72 hours. Most Recent Urinalysis On File:     Component Value Date/Time   COLORURINE AMBER (A) 12/08/2023 2249   APPEARANCEUR HAZY (A) 12/08/2023 2249   APPEARANCEUR Clear 08/18/2014 1354   LABSPEC 1.026 12/08/2023 2249   LABSPEC 1.020 08/18/2014 1354   PHURINE 5.0 12/08/2023 2249   GLUCOSEU NEGATIVE 12/08/2023 2249   GLUCOSEU 50 mg/dL 90/91/7984 8645   HGBUR MODERATE (A) 12/08/2023 2249   BILIRUBINUR NEGATIVE 12/08/2023 2249   BILIRUBINUR Negative 08/18/2014 1354   KETONESUR 20 (A) 12/08/2023 2249   PROTEINUR 100 (A) 12/08/2023 2249   NITRITE NEGATIVE 12/08/2023 2249   LEUKOCYTESUR NEGATIVE 12/08/2023 2249   LEUKOCYTESUR Negative 08/18/2014 1354   Sepsis Labs: @LABRCNTIP (procalcitonin:4,lacticidven:4) Microbiology: No results found for this or any previous visit (from the past 240 hours).    Radiology Studies last 3 days: No results found.        Misaki Sozio, DO [facility] 01/18/2024, 2:48 PM    Dictation software may have been used to generate the above note. Typos may occur and escape review in typed/dictated notes. Please contact Dr Marsa directly for clarity if needed.  Staff may message me via secure  chat in Epic  but this may not receive an immediate response,  please page me for urgent matters!  If 7PM-7AM, please contact night coverage [email or web address]

## 2024-01-18 NOTE — TOC Progression Note (Signed)
 Transition of Care [facility]) - Progression Note    Patient Details  Name: Meghan Jarvis MRN: [id number] Date of Birth: [birth year]  [facility] CM/SW Contact  Ladene Lady, LCSW Phone Number: 01/18/2024, 11:32 AM  Clinical Narrative:    Message left with beverly rucker to follow up on referral.        Expected Discharge Plan and Services         Expected Discharge Date: 01/10/24                                     Social Determinants of Health (SDOH) Interventions SDOH Screenings   Food Insecurity: Patient Unable To Answer (12/09/2023)  Recent Concern: Food Insecurity - Food Insecurity Present (09/26/2023)  Housing: Patient Unable To Answer (12/09/2023)  Recent Concern: Housing - Medium Risk (09/26/2023)  Transportation Needs: Patient Unable To Answer (12/09/2023)  Recent Concern: Transportation Needs - Unmet Transportation Needs (10/11/2023)  Utilities: Patient Unable To Answer (12/09/2023)  Recent Concern: Utilities - At Risk (09/25/2023)  Tobacco Use: High Risk (12/08/2023)    Readmission Risk Interventions     No data to display

## 2024-01-18 NOTE — Progress Notes (Signed)
 Date of Admission:  12/08/2023     ID: Meghan Jarvis is a 48 y.o. female  Principal Problem:   Acute metabolic encephalopathy Active Problems:   Alcohol abuse   Acute hypoxic respiratory failure (HCC)   Pneumonia of right lower lobe due to infectious organism   Protein-calorie malnutrition, severe   Uncontrolled type 2 diabetes mellitus with hypoglycemia, without long-term current use of insulin  (HCC)   Cocaine abuse (HCC)   AIDS (HCC)   Severe sepsis (HCC)   Chest pain   Pancytopenia (HCC)   Generalized weakness   Diarrhea   Right upper quadrant abdominal pain   Headache   Underweight (BMI < 18.5)   Hypotension   Abdominal pain   Encounter for psychiatric assessment   Rash due to allergy   Right knee pain   AKI (acute kidney injury) (HCC)  ? Meghan Jarvis is a 48 y.o. with a history of AIDS, not compliant with meds or visits ,  , cocaine use was brought in by EMS after being found unresponsive on the couch  in an aquaintance place-   Subjective: No complaints Doing okay Medications:   atovaquone   1,500 mg Oral Q breakfast   azithromycin   1,200 mg Oral Weekly   bictegravir-emtricitabine -tenofovir  AF  1 tablet Oral Daily   DermaCerin   Topical BID   divalproex   500 mg Oral QHS   docusate sodium   100 mg Oral BID   DULoxetine   30 mg Oral QHS   enoxaparin  (LOVENOX ) injection  40 mg Subcutaneous QHS   feeding supplement  237 mL Oral QID   folic acid   1 mg Oral Daily   lidocaine   1 patch Transdermal Q24H   midodrine   10 mg Oral TID WC   multivitamin with minerals  1 tablet Oral QHS   pantoprazole   40 mg Oral BID   thiamine   100 mg Oral Daily    Objective: Vital signs in last 24 hours: Patient Vitals for the past 24 hrs:  BP Temp Temp src Pulse Resp SpO2  01/18/24 0829 96/69 97.9 F (36.6 C) -- 82 18 100 %  01/18/24 0501 104/77 98.7 F (37.1 C) Oral 96 16 100 %  01/17/24 2223 99/64 98.9 F (37.2 C) Oral 98 16 100 %  01/17/24 1757 108/77 98 F (36.7 C)  -- (!) 45 18 100 %  Awake and alert Respond to questions appropriately [id number] Lungs clear to auscultation Abdomen soft Skin- hyperpigmentation, dryness, scaling has improved significantly. CNS grossly non focal   Lab Results    Latest Ref Rng & Units 01/12/2024    5:11 AM 01/11/2024    4:21 AM 01/10/2024    4:51 AM  CBC  WBC 4.0 - 10.5 K/uL 7.4  5.8  5.2   Hemoglobin 12.0 - 15.0 g/dL 8.4  8.3  8.4   Hematocrit 36.0 - 46.0 % 27.0  26.7  25.9   Platelets 150 - 400 K/uL 263  257  259        Latest Ref Rng & Units 01/12/2024    5:11 AM 01/11/2024    4:21 AM 01/10/2024    4:51 AM  CMP  Glucose 70 - 99 mg/dL 92  87  99   BUN 6 - 20 mg/dL 27  23  25    Creatinine 0.44 - 1.00 mg/dL 9.33  9.24  9.29   Sodium 135 - 145 mmol/L 138  139  140   Potassium 3.5 - 5.1 mmol/L 4.1  4.1  3.9   Chloride 98 - 111 mmol/L 103  107  106   CO2 22 - 32 mmol/L 25  25  25    Calcium  8.9 - 10.3 mg/dL 9.2  9.0  9.2       Microbiology: 12/28 BC NG  Toxo IgG > 400 Toxo PCR neg Toxo igM neg RPR NR CMV DNA neg Crypto neg HIV RNA 2 million>> 34, 000 Cd4 is 14 ( 2.4%) repeat on 1/29 is 416 ( 23%) Beta D glucan > 500 (  repeated) Histoplasma neg Fungal antibodies negative Genosure prime- no resistant mutations QuantiFERON gold indeterminate.  No treatment needed now.  Can repeat it later.     Assessment/Plan: Encephalopathy secondary to cocaine use as resolved  Aspiration leading to acute hypoxic respiratory failure with right middle lobe and lower lobe consolidation.  Status post intubation and was mechanically ventilated in the early part of the admission.  Now has been extubated for many days Took treatment with Zosyn  completed treatment repeat chest x-ray is clear      Rt sided  flank, chest pain-intermittent  CT abd ok- MRI spine deg changes- no acute findings  Rash- resolved likely was due to bactrim - changed to Atovaquone - Mepron    H/o fall with recurrent ED presentation- had hurt left  shoulder before     Pruritus with excoriations and hyperpigmentiaon Possibly due to aids, ? Bactrim  aggravated it Much better Scabies was in the D.D but as so much improved with just moisturizer ,it is unlikely    AIDS- non compliant with meds or visits to her Provider- not been in care since 2021- used to be followed at [facility] before Was on Biktarvy  at one time and was non compliant then VL now is 2 million and cd4 is 11  Started Biktarvy  on 12/14/23 . not sure how much compliance to be expected as her social situation is not conducive and so is her substance use- watch closely for Immune reconstitution inflammatory syndrome Genotype no resistance to NRTI, NNRTI, Integrase inhibitor and PI Repeat Vl shows a significant drop in 3 weeks from 2 million to 34,000 . Latest vl is 460 and CD4 is improved significantly from 11 to 413  RISK for IRIS- watch closely   Toxo IgG very high-  MRI brain no CNS lesions  . TOXO PCR negative On  PCP and MAI prophylaxis. Was On bactrim  for PCP and Toxo prophylaxis- because of new rash concerning for bactrim  allergy was changed to atovaquone  Rash resolved  Beta D glucan high > 500 (fungal antibodies negative Just observe.    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

## 2024-01-19 DIAGNOSIS — G9341 Metabolic encephalopathy: Secondary | ICD-10-CM | POA: Diagnosis not present

## 2024-01-19 LAB — URINALYSIS, COMPLETE (UACMP) WITH MICROSCOPIC
Bacteria, UA: NONE SEEN
Bilirubin Urine: NEGATIVE
Glucose, UA: NEGATIVE mg/dL
Hgb urine dipstick: NEGATIVE
Ketones, ur: NEGATIVE mg/dL
Nitrite: NEGATIVE
Protein, ur: NEGATIVE mg/dL
Specific Gravity, Urine: 1.024 (ref 1.005–1.030)
Squamous Epithelial / HPF: 0 /[HPF] (ref 0–5)
pH: 6 (ref 5.0–8.0)

## 2024-01-19 MED ORDER — OXYCODONE HCL 5 MG PO TABS
5.0000 mg | ORAL_TABLET | ORAL | Status: DC | PRN
  Administered 2024-01-19 – 2024-01-20 (×3): 5 mg via ORAL
  Filled 2024-01-19 (×4): qty 1

## 2024-01-19 NOTE — Plan of Care (Signed)
  Problem: Coping: Goal: Ability to adjust to condition or change in health will improve Outcome: Progressing   Problem: Fluid Volume: Goal: Ability to maintain a balanced intake and output will improve Outcome: Progressing   Problem: Health Behavior/Discharge Planning: Goal: Ability to identify and utilize available resources and services will improve Outcome: Progressing   Problem: Nutritional: Goal: Maintenance of adequate nutrition will improve Outcome: Progressing

## 2024-01-19 NOTE — Progress Notes (Signed)
 PROGRESS NOTE    Meghan Jarvis   FMW:[id number] DOB: 04/19/1976  DOA: 12/08/2023 Date of Service: 01/19/24 which is hospital day 42  PCP: Healthcare, [facility] course / significant events:   HPI: 48 year old female past medical history of alcohol abuse, cocaine abuse, homelessness, AIDS, atrial fibrillation, hypothyroidism, type 2 diabetes mellitus presented to [facility] with acute respiratory failure.  She was found down by a friend and could not wake her up.  She was found to be hypoxic in the 70s.  Since she was obtunded she was intubated and admitted by the critical care service.  He was started on antibiotics for pneumonia.  Urine toxicology positive for cocaine.  Patient extubated on 12/30.  12/12/2023.  Patient feeling okay.  Spoke with patient's friend [name] on the phone.  The patient is dating [name]'s son. 1/2.  Patient complained of chest pain.  Cardiac enzymes negative x 2 and EKG ok. 1/3.  Patient complaining of abdominal pain.RUQ US  negative.  MRI of the brain did not show any toxoplasmosis lesions but did show advanced cerebral atrophy. 1/4.  Patient complaining of headache.  Will give IV magnesium .  Patient walked 3 feet with physical therapy. 1/5.  Blood pressure this morning was low and 2 fluid boluses given.  Started on midodrine . 1/6.  Patient complaining of a lot of abdominal pain.  CT scan does not show any acute process and shows better aeration of right lower lung.  CT did show constipation.  Will start MiraLAX . 1/7.  Patient feeling good today.  Did not complain of any pain.  Hemoglobin 9.1, platelet count up at 311, white blood count 4.1  1/15.  We do not have a TOC plan at this point of time.  TOC reached out to APS.  Patient complains of right knee pain.  Uric acid 4.4.  X-ray negative. 1/16.  Patient feeling good and offers no complaints today. 1/17.  Patient complains of a slight headache. 1/18.  Patient complains of pain to nursing staff  but not to me. 1/19.  Patient in for Ace bandage for her right knee. 1/21.  I walked patient around the bed to the chair.  She used a walker.  Will give a trial of Flexeril  since patient's pain in her right flank. 1/22: Little agitated due to difficult blood drawn. 1/23: Patient refusing to answer orientation questions and becoming agitated.  Looks like does not have capacity to make decision but not cooperative for any proper evaluation. 1/24: Giving some IV fluid and also started on low-dose metoprolol  due to persistent mild tachycardia. 1/28: Psych reevaluated her, patient has capacity to make medical decision but need assistance with housing and finances due to cognitive decline.     Consultants:  Infectious disease   Procedures/Surgeries: none      ASSESSMENT & PLAN:   Acute metabolic encephalopathy-improved Mental status improved from admission but patient is not the best historian and does not capacity to make decisions at this time.  DSS referral by TOC.  MRI did not show any enhancing lesions but did show severe cerebral atrophy.  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 Mental status improved Have been seen by psychiatry and found to have capacity to make her own decision  Continue to monitor mental status TOC working on placement   abdominal pain Patient intermittently complains of abdominal pain, no change in character.  Prior imaging is negative.  Patient having bowel movements.  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  Infectious disease following.  Placed on PCP and MAI prophylaxis with atovaquone  and Zithromax .  Toxoplasma antibody IgG came back elevated and MRI of the brain does not show any enhancing lesions.  Infectious disease specialist started on Biktarvy .  Repeat viral load down to 34,300.  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  Platelet platelet count up in the normal range at 285, white blood cell count up in the normal range at 6.6, hemoglobin 8.1.   Protein-calorie malnutrition, severe Continue supplementation   Severe sepsis (HCC) Present on admission with tachycardia, tachypnea, fever, pneumonia, acute respiratory failure and acute metabolic encephalopathy.  Patient completed antibiotics.   Cocaine abuse (HCC) Patient has been counseled on cessation of cocaine use   Pneumonia of right lower lobe due to infectious organism Completed 5 days of antibiotic.   Acute hypoxic respiratory failure (HCC) Resolved.  Patient extubated on 12/30.  Patient currently breathing on room air.   Alcohol abuse Continue thiamine  multivitamin and folic acid    Uncontrolled type 2 diabetes mellitus with hypoglycemia, without long-term current use of insulin  (HCC) Hemoglobin A1c 6.0.    Generalized weakness Continue PT OT   Chest pain Atypical in nature.  Troponin x 2 negative.  EKG did not show any ST elevations.  Repeat EKG on 1/17 did not show any ST elevations.   AKI (acute kidney injury) (HCC) Continue monitoring Encouraged on adequate hydration   Right knee pain Patient asked for Ace bandage. She has not been wearing this. Intermittently complains of knee pain. Right knee x-ray negative.  Uric acid 4.4.  Continue to monitor.   Underweight (BMI < 18.5) Last BMI 18.54   Diarrhea-resolved continue as needed Colace for any constipation    Borderline underweight based on BMI: Body mass index is 19.51 kg/m.  Underweight - under 18  overweight - 25 to 29 obese - 30 or more Class 1 obesity: BMI of 30.0 to 34 Class 2 obesity: BMI of 35.0 to 39 Class 3 obesity: BMI of 40.0 to 49 Super Morbid Obesity: BMI 50-59 Super-super Morbid Obesity: BMI 60+ Significantly low or high BMI is associated with higher  medical risk.  Weight management advised as adjunct to other disease management and risk reduction treatments    DVT prophylaxis: lovenox   IV fluids: no continuous IV fluids  Nutrition: dysphagia Central lines / invasive devices: none  Code Status: FULL CODE  ACP documentation reviewed:  none on file in [facility]  TOC needs: placement Barriers to dispo / significant pending items: placement              Subjective / Brief ROS:  Patient reports  R knee bothering her same as usual Pain os controlled on current meds but she requests dose now  No concerns from RN   Family Communication: none    Objective Findings:  Vitals:   01/18/24 0829 01/18/24 1632 01/19/24 0421 01/19/24 0802  BP: 96/69 125/67 91/65 101/71  Pulse: 82 (!) 54 93 95  Resp: 18 17  16   Temp: 97.9 F (36.6 C) 98 F (36.7 C) 98.4 F (36.9 C) 98.5 F (36.9 C)  TempSrc:      SpO2: 100% 97% 100% 100%  Weight:      Height:        Intake/Output  Summary (Last 24 hours) at 01/19/2024 1252 Last data filed at 01/18/2024 1300 Gross per 24 hour  Intake 0 ml  Output --  Net 0 ml   Filed Weights   01/13/24 0500 01/15/24 0500 01/17/24 0500  Weight: 57.1 kg 58.2 kg 56.8 kg    Examination:  Physical Exam Constitutional:      General: She is not in acute distress. Cardiovascular:     Rate and Rhythm: Normal rate and regular rhythm.  Pulmonary:     Effort: Pulmonary effort is normal.     Breath sounds: Normal breath sounds.  Musculoskeletal:        General: Normal range of motion.     Right lower leg: No edema.     Left lower leg: No edema.  Skin:    General: Skin is warm and dry.  Neurological:     Mental Status: She is alert. Mental status is at baseline.  Psychiatric:        Mood and Affect: Mood normal.        Behavior: Behavior normal.          Scheduled Medications:   atovaquone   1,500 mg Oral Q breakfast   azithromycin   1,200 mg Oral Weekly   bictegravir-emtricitabine -tenofovir   AF  1 tablet Oral Daily   DermaCerin   Topical BID   divalproex   500 mg Oral QHS   docusate sodium   100 mg Oral BID   DULoxetine   30 mg Oral QHS   enoxaparin  (LOVENOX ) injection  40 mg Subcutaneous QHS   feeding supplement  237 mL Oral QID   folic acid   1 mg Oral Daily   lidocaine   1 patch Transdermal Q24H   midodrine   10 mg Oral TID WC   multivitamin with minerals  1 tablet Oral QHS   pantoprazole   40 mg Oral BID   thiamine   100 mg Oral Daily    Continuous Infusions:   PRN Medications:  acetaminophen , cyclobenzaprine , dicyclomine , diphenhydrAMINE , ipratropium-albuterol , lactulose , nicotine  polacrilex, oxyCODONE , polyethylene glycol  Antimicrobials from admission:  Anti-infectives (From admission, onward)    Start     Dose/Rate Route Frequency Ordered Stop   12/25/23 0800  atovaquone  (MEPRON ) 750 MG/5ML suspension 1,500 mg        1,500 mg Oral Daily with breakfast 12/24/23 1558     12/24/23 1000  sulfamethoxazole -trimethoprim  (BACTRIM ) 400-80 MG per tablet 1 tablet  Status:  Discontinued        1 tablet Oral Daily 12/24/23 0749 12/24/23 1557   12/19/23 1400  azithromycin  (ZITHROMAX ) tablet 1,200 mg        1,200 mg Oral Weekly 12/18/23 1346     12/18/23 1000  sulfamethoxazole -trimethoprim  (BACTRIM  DS) 800-160 MG per tablet 1 tablet  Status:  Discontinued        1 tablet Oral Daily 12/17/23 1621 12/24/23 0748   12/14/23 1530  bictegravir-emtricitabine -tenofovir  AF (BIKTARVY ) 50-200-25 MG per tablet 1 tablet        1 tablet Oral Daily 12/14/23 1442     12/12/23 1000  amoxicillin -clavulanate (AUGMENTIN ) 875-125 MG per tablet 1 tablet        1 tablet Oral Every 12 hours 12/11/23 1345 12/12/23 2131   12/12/23 1000  azithromycin  (ZITHROMAX ) tablet 1,250 mg  Status:  Discontinued        1,200 mg Oral Weekly 12/11/23 1618 12/18/23 1346   12/11/23 2000  sulfamethoxazole -trimethoprim  (BACTRIM ) 400-80 MG per tablet 1 tablet  Status:  Discontinued        1  tablet Oral Daily 12/11/23 1618  12/17/23 1621   12/09/23 2200  azithromycin  (ZITHROMAX ) 500 mg in sodium chloride  0.9 % 250 mL IVPB  Status:  Discontinued        500 mg 250 mL/hr over 60 Minutes Intravenous Every 24 hours 12/09/23 0734 12/11/23 1618   12/09/23 2000  cefTRIAXone  (ROCEPHIN ) 2 g in sodium chloride  0.9 % 100 mL IVPB  Status:  Discontinued        2 g 200 mL/hr over 30 Minutes Intravenous Every 24 hours 12/09/23 0953 12/09/23 1840   12/09/23 2000  piperacillin -tazobactam (ZOSYN ) IVPB 3.375 g        3.375 g 12.5 mL/hr over 240 Minutes Intravenous Every 8 hours 12/09/23 1850 12/12/23 0037   12/09/23 0600  Ampicillin -Sulbactam (UNASYN ) 3 g in sodium chloride  0.9 % 100 mL IVPB  Status:  Discontinued        3 g 200 mL/hr over 30 Minutes Intravenous Every 6 hours 12/09/23 0442 12/09/23 0953   12/08/23 2215  cefTRIAXone  (ROCEPHIN ) 2 g in sodium chloride  0.9 % 100 mL IVPB        2 g 200 mL/hr over 30 Minutes Intravenous Once 12/08/23 2214 12/08/23 2308   12/08/23 2215  azithromycin  (ZITHROMAX ) 500 mg in sodium chloride  0.9 % 250 mL IVPB        500 mg 250 mL/hr over 60 Minutes Intravenous  Once 12/08/23 2214 12/09/23 0000           Data Reviewed:  I have personally reviewed the following...  CBC: No results for input(s): WBC, NEUTROABS, HGB, HCT, MCV, PLT in the last 168 hours.  Basic Metabolic Panel: No results for input(s): NA, K, CL, CO2, GLUCOSE, BUN, CREATININE, CALCIUM , MG, PHOS in the last 168 hours.  GFR: Estimated Creatinine Clearance: 76.3 mL/min (by C-G formula based on SCr of 0.66 mg/dL). Liver Function Tests: No results for input(s): AST, ALT, ALKPHOS, BILITOT, PROT, ALBUMIN in the last 168 hours.  No results for input(s): LIPASE, AMYLASE in the last 168 hours. No results for input(s): AMMONIA in the last 168 hours. Coagulation Profile: No results for input(s): INR, PROTIME in the last 168 hours. Cardiac Enzymes: No results for  input(s): CKTOTAL, CKMB, CKMBINDEX, TROPONINI in the last 168 hours. BNP (last 3 results) No results for input(s): PROBNP in the last 8760 hours. HbA1C: No results for input(s): HGBA1C in the last 72 hours. CBG: No results for input(s): GLUCAP in the last 168 hours. Lipid Profile: No results for input(s): CHOL, HDL, LDLCALC, TRIG, CHOLHDL, LDLDIRECT in the last 72 hours. Thyroid Function Tests: No results for input(s): TSH, T4TOTAL, FREET4, T3FREE, THYROIDAB in the last 72 hours. Anemia Panel: No results for input(s): VITAMINB12, FOLATE, FERRITIN, TIBC, IRON , RETICCTPCT in the last 72 hours. Most Recent Urinalysis On File:     Component Value Date/Time   COLORURINE AMBER (A) 12/08/2023 2249   APPEARANCEUR HAZY (A) 12/08/2023 2249   APPEARANCEUR Clear 08/18/2014 1354   LABSPEC 1.026 12/08/2023 2249   LABSPEC 1.020 08/18/2014 1354   PHURINE 5.0 12/08/2023 2249   GLUCOSEU NEGATIVE 12/08/2023 2249   GLUCOSEU 50 mg/dL 90/91/7984 8645   HGBUR MODERATE (A) 12/08/2023 2249   BILIRUBINUR NEGATIVE 12/08/2023 2249   BILIRUBINUR Negative 08/18/2014 1354   KETONESUR 20 (A) 12/08/2023 2249   PROTEINUR 100 (A) 12/08/2023 2249   NITRITE NEGATIVE 12/08/2023 2249   LEUKOCYTESUR NEGATIVE 12/08/2023 2249   LEUKOCYTESUR Negative 08/18/2014 1354   Sepsis Labs: @LABRCNTIP (procalcitonin:4,lacticidven:4) Microbiology: No results found for this  or any previous visit (from the past 240 hours).    Radiology Studies last 3 days: No results found.        Dory Verdun, DO [facility] 01/19/2024, 12:52 PM    Dictation software may have been used to generate the above note. Typos may occur and escape review in typed/dictated notes. Please contact Dr Marsa directly for clarity if needed.  Staff may message me via secure chat in Epic  but this may not receive an immediate response,  please page me for urgent matters!  If 7PM-7AM,  please contact night coverage [email or web address]

## 2024-01-19 NOTE — Progress Notes (Signed)
 Occupational Therapy Treatment Patient Details Name: Meghan Jarvis MRN: [id number] DOB: 20-Apr-1976 Today's Date: 01/19/2024   History of present illness Pt is a 48 year old female admitted with [facility] with acute hypoxic respiratory failure, severe sepsis, community acquired PNA acute metabolic encephalopathy; She was intubated, extubated 12/30    PMH significant for alcohol abuse, cocaine abuse, homelessness, AIDS, atrial fibrillation, hypothyroidism, type 2 diabetes mellitus   OT comments  Pt performed bed mobility, transfers, toileting, grooming, bathing in sitting, clothing change, all with no to little physical assistance from therapist; however, with regular verbal cueing to continue with tasks at hand. Discussed and updated goals of care. Pt states she is nervous about group home living, stating that she is never been in such an environment and worries what the other people living there will be like. Provided reassurance and discussed advantages of a group home. Discussed medication mgmt with pt, emphasizing how important to her health it will be for her to take her medications regularly and as directed. Pt verbalizes understanding. CNA is in room to assist with changing bedding and pt bathing.       If plan is discharge home, recommend the following:  Assistance with cooking/housework;Assistance with feeding;Direct supervision/assist for medications management;Direct supervision/assist for financial management;Assist for transportation;Help with stairs or ramp for entrance;Supervision due to cognitive status;A little help with walking and/or transfers;A little help with bathing/dressing/bathroom   Equipment Recommendations       Recommendations for Other Services      Precautions / Restrictions Precautions Precautions: Fall Restrictions Weight Bearing Restrictions Per Provider Order: No       Mobility Bed Mobility Overal bed mobility: Modified Independent Bed Mobility: Supine to  Sit     Supine to sit: Modified independent (Device/Increase time)     General bed mobility comments: Independent with bed mobility    Transfers Overall transfer level: Needs assistance Equipment used: Rolling walker (2 wheels), None Transfers: Sit to/from Stand Sit to Stand: Supervision     Step pivot transfers: Supervision     General transfer comment: Cues for safety, RW use     Balance Overall balance assessment: Needs assistance Sitting-balance support: No upper extremity supported Sitting balance-Leahy Scale: Good     Standing balance support: Bilateral upper extremity supported, During functional activity, Single extremity supported Standing balance-Leahy Scale: Fair Standing balance comment: encouraged to use RW to support R LE weakness and discomfort                           ADL either performed or assessed with clinical judgement   ADL Overall ADL's : Needs assistance/impaired Eating/Feeding: Sitting;Modified independent Eating/Feeding Details (indicate cue type and reason): Pt has empty food containers, napkins, crumbs in bed, on floor, on recliner Grooming: Wash/dry hands;Wash/dry face;Sitting;Cueing for sequencing;Applying deodorant;Set up Grooming Details (indicate cue type and reason): from recliner Upper Body Bathing: Set up;Sitting;Cueing for sequencing Upper Body Bathing Details (indicate cue type and reason): verbal cues for task continuation Lower Body Bathing: Set up;Contact guard assist;Sitting/lateral leans;Cueing for sequencing   Upper Body Dressing : Supervision/safety;Sitting   Lower Body Dressing: Supervision/safety;Sitting/lateral leans   Toilet Transfer: Supervision/safety;Rolling walker (2 wheels)   Toileting- Clothing Manipulation and Hygiene: Modified independent;Sitting/lateral lean Toileting - Clothing Manipulation Details (indicate cue type and reason): Pt able to manage without assistance today     Functional  mobility during ADLs: Rolling walker (2 wheels);Supervision/safety General ADL Comments: Largely able to perform ADL w/o assistance,  although required frequent VCs to continue with tasks, w/ pt repeatedly becoming distracted or stating she was tired, wanted to return to bed    Extremity/Trunk Assessment Upper Extremity Assessment Upper Extremity Assessment: Generalized weakness   Lower Extremity Assessment Lower Extremity Assessment: Generalized weakness        Vision       Perception     Praxis      Cognition Arousal: Alert Behavior During Therapy: WFL for tasks assessed/performed Overall Cognitive Status: No family/caregiver present to determine baseline cognitive functioning Area of Impairment: Orientation, Attention, Memory, Following commands, Safety/judgement, Awareness, Problem solving                   Current Attention Level: Divided Memory: Decreased short-term memory, Decreased recall of precautions Following Commands: Follows multi-step commands consistently Safety/Judgement: Decreased awareness of safety, Decreased awareness of deficits   Problem Solving: Requires verbal cues          Exercises Other Exercises Other Exercises: Educ re: importance of taking medications as prescribed post DC; advantages of group home living    Shoulder Instructions       General Comments      Pertinent Vitals/ Pain       Pain Assessment Pain Assessment: Faces Faces Pain Scale: Hurts little more Pain Location: Right side of torso Pain Descriptors / Indicators: Aching, Discomfort, Grimacing Pain Intervention(s): Repositioned, Limited activity within patient's tolerance, Monitored during session  Home Living                                          Prior Functioning/Environment              Frequency  Min 1X/week        Progress Toward Goals  OT Goals(current goals can now be found in the care plan section)  Progress towards OT  goals: Progressing toward goals  Acute Rehab OT Goals OT Goal Formulation: With patient Time For Goal Achievement: 02/02/24 Potential to Achieve Goals: Good  Plan      Co-evaluation                 AM-PAC OT 6 Clicks Daily Activity     Outcome Measure   Help from another person eating meals?: None Help from another person taking care of personal grooming?: A Little Help from another person toileting, which includes using toliet, bedpan, or urinal?: A Little Help from another person bathing (including washing, rinsing, drying)?: A Little Help from another person to put on and taking off regular upper body clothing?: None Help from another person to put on and taking off regular lower body clothing?: None 6 Click Score: 21    End of Session Equipment Utilized During Treatment: Rolling walker (2 wheels)  OT Visit Diagnosis: Other abnormalities of gait and mobility (R26.89);Muscle weakness (generalized) (M62.81)   Activity Tolerance Patient tolerated treatment well   Patient Left in chair;with call bell/phone within reach;with nursing/sitter in room   Nurse Communication          Time: 9062-9042 OT Time Calculation (min): 20 min  Charges: OT General Charges $OT Visit: 1 Visit OT Evaluation $OT Re-eval: 1 Re-eval OT Treatments $Self Care/Home Management : 8-22 mins  Suzen Hock, PhD, MS, OTR/L 01/19/24, 10:20 AM

## 2024-01-20 DIAGNOSIS — J9601 Acute respiratory failure with hypoxia: Secondary | ICD-10-CM | POA: Diagnosis not present

## 2024-01-20 DIAGNOSIS — G9341 Metabolic encephalopathy: Secondary | ICD-10-CM | POA: Diagnosis not present

## 2024-01-20 DIAGNOSIS — B20 Human immunodeficiency virus [HIV] disease: Secondary | ICD-10-CM | POA: Diagnosis not present

## 2024-01-20 LAB — BASIC METABOLIC PANEL
Anion gap: 9 (ref 5–15)
BUN: 40 mg/dL — ABNORMAL HIGH (ref 6–20)
CO2: 27 mmol/L (ref 22–32)
Calcium: 9.5 mg/dL (ref 8.9–10.3)
Chloride: 103 mmol/L (ref 98–111)
Creatinine, Ser: 0.78 mg/dL (ref 0.44–1.00)
GFR, Estimated: >60 mL/min (ref >60)
Glucose, Bld: 96 mg/dL (ref 70–99)
Potassium: 4.3 mmol/L (ref 3.5–5.1)
Sodium: 139 mmol/L (ref 135–145)

## 2024-01-20 LAB — CBC
HCT: 30.6 % — ABNORMAL LOW (ref 36.0–46.0)
Hemoglobin: 9.5 g/dL — ABNORMAL LOW (ref 12.0–15.0)
MCH: 27.6 pg (ref 26.0–34.0)
MCHC: 31 g/dL (ref 30.0–36.0)
MCV: 89 fL (ref 80.0–100.0)
Platelets: 279 10*3/uL (ref 150–400)
RBC: 3.44 MIL/uL — ABNORMAL LOW (ref 3.87–5.11)
RDW: 16.9 % — ABNORMAL HIGH (ref 11.5–15.5)
WBC: 6.4 10*3/uL (ref 4.0–10.5)
nRBC: 0 % (ref 0.0–0.2)

## 2024-01-20 MED ORDER — OXYCODONE HCL 5 MG PO TABS
5.0000 mg | ORAL_TABLET | Freq: Three times a day (TID) | ORAL | Status: DC | PRN
  Administered 2024-01-20 – 2024-03-06 (×44): 5 mg via ORAL
  Filled 2024-01-20 (×47): qty 1

## 2024-01-20 NOTE — Progress Notes (Signed)
 Contacted on call provider via secure chat with message as follows:  Pt has been c/o severe right groin pain, which is a new symptom for her. Her mobility is weak, slown and limping, c/o pain from rt middle thigh to right hip. She looks like she is going to pass out from the pain, this is after having been medicated for pain. I don't know if she has fallen or not but she says no, however her memory is not good. She does not look like herself - or her baseline. Should we do an x-ray?

## 2024-01-20 NOTE — Plan of Care (Signed)
  Problem: Coping: Goal: Ability to adjust to condition or change in health will improve Outcome: Progressing   Problem: Fluid Volume: Goal: Ability to maintain a balanced intake and output will improve Outcome: Progressing   Problem: Health Behavior/Discharge Planning: Goal: Ability to identify and utilize available resources and services will improve Outcome: Progressing Goal: Ability to manage health-related needs will improve Outcome: Progressing   Problem: Metabolic: Goal: Ability to maintain appropriate glucose levels will improve Outcome: Progressing   Problem: Nutritional: Goal: Maintenance of adequate nutrition will improve Outcome: Progressing Goal: Progress toward achieving an optimal weight will improve Outcome: Progressing   Problem: Skin Integrity: Goal: Risk for impaired skin integrity will decrease Outcome: Progressing   Problem: Tissue Perfusion: Goal: Adequacy of tissue perfusion will improve Outcome: Progressing   Problem: Tissue Perfusion: Goal: Adequacy of tissue perfusion will improve Outcome: Progressing   Problem: Education: Goal: Knowledge of General Education information will improve Description: Including pain rating scale, medication(s)/side effects and non-pharmacologic comfort measures Outcome: Progressing   Problem: Health Behavior/Discharge Planning: Goal: Ability to manage health-related needs will improve Outcome: Progressing   Problem: Clinical Measurements: Goal: Ability to maintain clinical measurements within normal limits will improve Outcome: Progressing Goal: Will remain free from infection Outcome: Progressing Goal: Diagnostic test results will improve Outcome: Progressing Goal: Respiratory complications will improve Outcome: Progressing Goal: Cardiovascular complication will be avoided Outcome: Progressing   Problem: Activity: Goal: Risk for activity intolerance will decrease Outcome: Progressing   Problem:  Nutrition: Goal: Adequate nutrition will be maintained Outcome: Progressing   Problem: Coping: Goal: Level of anxiety will decrease Outcome: Progressing   Problem: Elimination: Goal: Will not experience complications related to bowel motility Outcome: Progressing Goal: Will not experience complications related to urinary retention Outcome: Progressing   Problem: Pain Management: Goal: General experience of comfort will improve Outcome: Progressing   Problem: Safety: Goal: Ability to remain free from injury will improve Outcome: Progressing   Problem: Skin Integrity: Goal: Risk for impaired skin integrity will decrease Outcome: Progressing   Problem: Activity: Goal: Ability to tolerate increased activity will improve Outcome: Progressing   Problem: Respiratory: Goal: Ability to maintain a clear airway and adequate ventilation will improve Outcome: Progressing

## 2024-01-20 NOTE — Progress Notes (Signed)
 PROGRESS NOTE    Meghan Jarvis   FMW:[id number] DOB: June 03, 1976  DOA: 12/08/2023 Date of Service: 01/20/24 which is hospital day 23  PCP: [facility], [facility] course / significant events:   HPI: 48 year old female past medical history of alcohol abuse, cocaine abuse, homelessness, AIDS, atrial fibrillation, hypothyroidism, type 2 diabetes mellitus presented to [facility] with acute respiratory failure.  She was found down by a friend and could not wake her up.  She was found to be hypoxic in the 70s.  Since she was obtunded she was intubated and admitted by the critical care service.  He was started on antibiotics for pneumonia.  Urine toxicology positive for cocaine.  Patient extubated on 12/30.  12/12/2023.  Patient feeling okay.  Spoke with patient's friend [name] on the phone.  The patient is dating [name]'s son. 1/2.  Patient complained of chest pain.  Cardiac enzymes negative x 2 and EKG ok. 1/3.  Patient complaining of abdominal pain.RUQ US  negative.  MRI of the brain did not show any toxoplasmosis lesions but did show advanced cerebral atrophy. 1/4.  Patient complaining of headache.  Will give IV magnesium .  Patient walked 3 feet with physical therapy. 1/5.  Blood pressure this morning was low and 2 fluid boluses given.  Started on midodrine . 1/6.  Patient complaining of a lot of abdominal pain.  CT scan does not show any acute process and shows better aeration of right lower lung.  CT did show constipation.  Will start MiraLAX . 1/7.  Patient feeling good today.  Did not complain of any pain.  Hemoglobin 9.1, platelet count up at 311, white blood count 4.1  1/15.  We do not have a TOC plan at this point of time.  TOC reached out to APS.  Patient complains of right knee pain.  Uric acid 4.4.  X-ray negative. 1/16.  Patient feeling good and offers no complaints today. 1/17.  Patient complains of a slight headache. 1/18.  Patient complains of pain to nursing staff  but not to me. 1/19.  Patient in for Ace bandage for her right knee. 1/21.  I walked patient around the bed to the chair.  She used a walker.  Will give a trial of Flexeril  since patient's pain in her right flank. 1/22: Little agitated due to difficult blood drawn. 1/23: Patient refusing to answer orientation questions and becoming agitated.  Looks like does not have capacity to make decision but not cooperative for any proper evaluation. 1/24: Giving some IV fluid and also started on low-dose metoprolol  due to persistent mild tachycardia. 1/28: Psych reevaluated her, patient has capacity to make medical decision but need assistance with housing and finances due to cognitive decline.     Consultants:  Infectious disease   Procedures/Surgeries: none      ASSESSMENT & PLAN:   Acute metabolic encephalopathy-improved Mental status improved from admission but patient is not the best historian and does not capacity to make decisions at this time.  DSS referral by TOC.  MRI did not show any enhancing lesions but did show severe cerebral atrophy.  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 Mental status improved Have been seen by psychiatry and found to have capacity to make her own decision  Continue to monitor mental status TOC working on placement   abdominal pain Patient intermittently complains of abdominal pain, no change in character.  Prior imaging is negative.  Patient having bowel movements.  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  Infectious disease following.  Placed on PCP and MAI prophylaxis with atovaquone  and Zithromax .  Toxoplasma antibody IgG came back elevated and MRI of the brain does not show any enhancing lesions.  Infectious disease specialist started on Biktarvy .  Repeat viral load down to 34,300.  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  Platelet platelet count up in the normal range at 285, white blood cell count up in the normal range at 6.6, hemoglobin 8.1.   Protein-calorie malnutrition, severe Continue supplementation   Severe sepsis (HCC) Present on admission with tachycardia, tachypnea, fever, pneumonia, acute respiratory failure and acute metabolic encephalopathy.  Patient completed antibiotics.   Cocaine abuse (HCC) Patient has been counseled on cessation of cocaine use   Pneumonia of right lower lobe due to infectious organism Completed 5 days of antibiotic.   Acute hypoxic respiratory failure (HCC) Resolved.  Patient extubated on 12/30.  Patient currently breathing on room air.   Alcohol abuse Continue thiamine  multivitamin and folic acid    Uncontrolled type 2 diabetes mellitus with hypoglycemia, without long-term current use of insulin  (HCC) Hemoglobin A1c 6.0.    Generalized weakness Continue PT OT   Chest pain Atypical in nature.  Troponin x 2 negative.  EKG did not show any ST elevations.  Repeat EKG on 1/17 did not show any ST elevations.   AKI (acute kidney injury) (HCC) Continue monitoring Encouraged on adequate hydration   Right knee pain Patient asked for Ace bandage. She has not been wearing this. Intermittently complains of knee pain. Right knee x-ray negative.  Uric acid 4.4.  Continue to monitor.   Underweight (BMI < 18.5) Last BMI 18.54   Diarrhea-resolved continue as needed Colace for any constipation    Borderline underweight based on BMI: Body mass index is 19.51 kg/m.  Underweight - under 18  overweight - 25 to 29 obese - 30 or more Class 1 obesity: BMI of 30.0 to 34 Class 2 obesity: BMI of 35.0 to 39 Class 3 obesity: BMI of 40.0 to 49 Super Morbid Obesity: BMI 50-59 Super-super Morbid Obesity: BMI 60+ Significantly low or high BMI is associated with higher  medical risk.  Weight management advised as adjunct to other disease management and risk reduction treatments    DVT prophylaxis: lovenox   IV fluids: no continuous IV fluids  Nutrition: dysphagia Central lines / invasive devices: none  Code Status: FULL CODE  ACP documentation reviewed:  none on file in [facility]  TOC needs: placement Barriers to dispo / significant pending items: placement              Subjective / Brief ROS:  Patient reports no concerns No concerns from RN   Family Communication: none    Objective Findings:  Vitals:   01/19/24 2031 01/20/24 0500 01/20/24 0518 01/20/24 0755  BP: 94/67  90/67 (!) 83/58  Pulse: 94  97 89  Resp: 18  20 17   Temp: 98.4 F (36.9 C)  (!) 97.5 F (36.4 C) 97.6 F (36.4 C)  TempSrc:      SpO2: 100%  100% 100%  Weight:  61 kg    Height:        Intake/Output Summary (Last 24 hours) at 01/20/2024 1216 Last data filed at 01/20/2024 1034 Gross per 24 hour  Intake  240 ml  Output 50 ml  Net 190 ml   Filed Weights   01/15/24 0500 01/17/24 0500 01/20/24 0500  Weight: 58.2 kg 56.8 kg 61 kg    Examination:  Physical Exam Constitutional:      General: She is not in acute distress. Cardiovascular:     Rate and Rhythm: Regular rhythm.  Pulmonary:     Effort: Pulmonary effort is normal.  Neurological:     Mental Status: She is alert. Mental status is at baseline.  Psychiatric:        Mood and Affect: Mood normal.        Behavior: Behavior normal.          Scheduled Medications:   atovaquone   1,500 mg Oral Q breakfast   azithromycin   1,200 mg Oral Weekly   bictegravir-emtricitabine -tenofovir  AF  1 tablet Oral Daily   DermaCerin   Topical BID   divalproex   500 mg Oral QHS   docusate sodium   100 mg Oral BID   DULoxetine   30 mg Oral QHS   enoxaparin  (LOVENOX ) injection  40 mg Subcutaneous QHS   feeding supplement  237 mL Oral QID   folic acid   1 mg Oral Daily   lidocaine   1 patch Transdermal Q24H    midodrine   10 mg Oral TID WC   multivitamin with minerals  1 tablet Oral QHS   pantoprazole   40 mg Oral BID   thiamine   100 mg Oral Daily    Continuous Infusions:   PRN Medications:  acetaminophen , cyclobenzaprine , dicyclomine , diphenhydrAMINE , ipratropium-albuterol , lactulose , nicotine  polacrilex, oxyCODONE , polyethylene glycol  Antimicrobials from admission:  Anti-infectives (From admission, onward)    Start     Dose/Rate Route Frequency Ordered Stop   12/25/23 0800  atovaquone  (MEPRON ) 750 MG/5ML suspension 1,500 mg        1,500 mg Oral Daily with breakfast 12/24/23 1558     12/24/23 1000  sulfamethoxazole -trimethoprim  (BACTRIM ) 400-80 MG per tablet 1 tablet  Status:  Discontinued        1 tablet Oral Daily 12/24/23 0749 12/24/23 1557   12/19/23 1400  azithromycin  (ZITHROMAX ) tablet 1,200 mg        1,200 mg Oral Weekly 12/18/23 1346     12/18/23 1000  sulfamethoxazole -trimethoprim  (BACTRIM  DS) 800-160 MG per tablet 1 tablet  Status:  Discontinued        1 tablet Oral Daily 12/17/23 1621 12/24/23 0748   12/14/23 1530  bictegravir-emtricitabine -tenofovir  AF (BIKTARVY ) 50-200-25 MG per tablet 1 tablet        1 tablet Oral Daily 12/14/23 1442     12/12/23 1000  amoxicillin -clavulanate (AUGMENTIN ) 875-125 MG per tablet 1 tablet        1 tablet Oral Every 12 hours 12/11/23 1345 12/12/23 2131   12/12/23 1000  azithromycin  (ZITHROMAX ) tablet 1,250 mg  Status:  Discontinued        1,200 mg Oral Weekly 12/11/23 1618 12/18/23 1346   12/11/23 2000  sulfamethoxazole -trimethoprim  (BACTRIM ) 400-80 MG per tablet 1 tablet  Status:  Discontinued        1 tablet Oral Daily 12/11/23 1618 12/17/23 1621   12/09/23 2200  azithromycin  (ZITHROMAX ) 500 mg in sodium chloride  0.9 % 250 mL IVPB  Status:  Discontinued        500 mg 250 mL/hr over 60 Minutes Intravenous Every 24 hours 12/09/23 0734 12/11/23 1618   12/09/23 2000  cefTRIAXone  (ROCEPHIN ) 2 g in sodium chloride  0.9 % 100 mL IVPB  Status:   Discontinued  2 g 200 mL/hr over 30 Minutes Intravenous Every 24 hours 12/09/23 0953 12/09/23 1840   12/09/23 2000  piperacillin -tazobactam (ZOSYN ) IVPB 3.375 g        3.375 g 12.5 mL/hr over 240 Minutes Intravenous Every 8 hours 12/09/23 1850 12/12/23 0037   12/09/23 0600  Ampicillin -Sulbactam (UNASYN ) 3 g in sodium chloride  0.9 % 100 mL IVPB  Status:  Discontinued        3 g 200 mL/hr over 30 Minutes Intravenous Every 6 hours 12/09/23 0442 12/09/23 0953   12/08/23 2215  cefTRIAXone  (ROCEPHIN ) 2 g in sodium chloride  0.9 % 100 mL IVPB        2 g 200 mL/hr over 30 Minutes Intravenous Once 12/08/23 2214 12/08/23 2308   12/08/23 2215  azithromycin  (ZITHROMAX ) 500 mg in sodium chloride  0.9 % 250 mL IVPB        500 mg 250 mL/hr over 60 Minutes Intravenous  Once 12/08/23 2214 12/09/23 0000           Data Reviewed:  I have personally reviewed the following...  CBC: Recent Labs  Lab 01/20/24 0508  WBC 6.4  HGB 9.5*  HCT 30.6*  MCV 89.0  PLT 279    Basic Metabolic Panel: Recent Labs  Lab 01/20/24 0508  NA 139  K 4.3  CL 103  CO2 27  GLUCOSE 96  BUN 40*  CREATININE 0.78  CALCIUM  9.5    GFR: Estimated Creatinine Clearance: 81.9 mL/min (by C-G formula based on SCr of 0.78 mg/dL). Liver Function Tests: No results for input(s): AST, ALT, ALKPHOS, BILITOT, PROT, ALBUMIN in the last 168 hours.  No results for input(s): LIPASE, AMYLASE in the last 168 hours. No results for input(s): AMMONIA in the last 168 hours. Coagulation Profile: No results for input(s): INR, PROTIME in the last 168 hours. Cardiac Enzymes: No results for input(s): CKTOTAL, CKMB, CKMBINDEX, TROPONINI in the last 168 hours. BNP (last 3 results) No results for input(s): PROBNP in the last 8760 hours. HbA1C: No results for input(s): HGBA1C in the last 72 hours. CBG: No results for input(s): GLUCAP in the last 168 hours. Lipid Profile: No results for  input(s): CHOL, HDL, LDLCALC, TRIG, CHOLHDL, LDLDIRECT in the last 72 hours. Thyroid Function Tests: No results for input(s): TSH, T4TOTAL, FREET4, T3FREE, THYROIDAB in the last 72 hours. Anemia Panel: No results for input(s): VITAMINB12, FOLATE, FERRITIN, TIBC, IRON , RETICCTPCT in the last 72 hours. Most Recent Urinalysis On File:     Component Value Date/Time   COLORURINE YELLOW (A) 01/19/2024 2300   APPEARANCEUR HAZY (A) 01/19/2024 2300   APPEARANCEUR Clear 08/18/2014 1354   LABSPEC 1.024 01/19/2024 2300   LABSPEC 1.020 08/18/2014 1354   PHURINE 6.0 01/19/2024 2300   GLUCOSEU NEGATIVE 01/19/2024 2300   GLUCOSEU 50 mg/dL 90/91/7984 8645   HGBUR NEGATIVE 01/19/2024 2300   BILIRUBINUR NEGATIVE 01/19/2024 2300   BILIRUBINUR Negative 08/18/2014 1354   KETONESUR NEGATIVE 01/19/2024 2300   PROTEINUR NEGATIVE 01/19/2024 2300   NITRITE NEGATIVE 01/19/2024 2300   LEUKOCYTESUR MODERATE (A) 01/19/2024 2300   LEUKOCYTESUR Negative 08/18/2014 1354   Sepsis Labs: @LABRCNTIP (procalcitonin:4,lacticidven:4) Microbiology: No results found for this or any previous visit (from the past 240 hours).    Radiology Studies last 3 days: No results found.        Ariston Grandison, DO [facility] 01/20/2024, 12:16 PM    Dictation software may have been used to generate the above note. Typos may occur and escape review in typed/dictated notes. Please contact  Dr Marsa directly for clarity if needed.  Staff may message me via secure chat in Epic  but this may not receive an immediate response,  please page me for urgent matters!  If 7PM-7AM, please contact night coverage [email or web address]

## 2024-01-20 NOTE — Plan of Care (Signed)
  Problem: Coping: Goal: Ability to adjust to condition or change in health will improve Outcome: Progressing   Problem: Fluid Volume: Goal: Ability to maintain a balanced intake and output will improve Outcome: Progressing   Problem: Health Behavior/Discharge Planning: Goal: Ability to identify and utilize available resources and services will improve Outcome: Progressing Goal: Ability to manage health-related needs will improve Outcome: Progressing   Problem: Metabolic: Goal: Ability to maintain appropriate glucose levels will improve Outcome: Progressing   Problem: Nutritional: Goal: Maintenance of adequate nutrition will improve Outcome: Progressing Goal: Progress toward achieving an optimal weight will improve Outcome: Progressing   Problem: Skin Integrity: Goal: Risk for impaired skin integrity will decrease Outcome: Progressing   Problem: Tissue Perfusion: Goal: Adequacy of tissue perfusion will improve Outcome: Progressing   Problem: Education: Goal: Knowledge of General Education information will improve Description: Including pain rating scale, medication(s)/side effects and non-pharmacologic comfort measures Outcome: Progressing   Problem: Health Behavior/Discharge Planning: Goal: Ability to manage health-related needs will improve Outcome: Progressing   Problem: Clinical Measurements: Goal: Ability to maintain clinical measurements within normal limits will improve Outcome: Progressing Goal: Will remain free from infection Outcome: Progressing Goal: Diagnostic test results will improve Outcome: Progressing Goal: Respiratory complications will improve Outcome: Progressing Goal: Cardiovascular complication will be avoided Outcome: Progressing   Problem: Activity: Goal: Risk for activity intolerance will decrease Outcome: Progressing   Problem: Nutrition: Goal: Adequate nutrition will be maintained Outcome: Progressing   Problem: Coping: Goal:  Level of anxiety will decrease Outcome: Progressing   Problem: Elimination: Goal: Will not experience complications related to bowel motility Outcome: Progressing Goal: Will not experience complications related to urinary retention Outcome: Progressing   Problem: Pain Management: Goal: General experience of comfort will improve Outcome: Progressing   Problem: Safety: Goal: Ability to remain free from injury will improve Outcome: Progressing   Problem: Skin Integrity: Goal: Risk for impaired skin integrity will decrease Outcome: Progressing   Problem: Activity: Goal: Ability to tolerate increased activity will improve Outcome: Progressing   Problem: Respiratory: Goal: Ability to maintain a clear airway and adequate ventilation will improve Outcome: Progressing

## 2024-01-21 DIAGNOSIS — G9341 Metabolic encephalopathy: Secondary | ICD-10-CM | POA: Diagnosis not present

## 2024-01-21 LAB — URINALYSIS, W/ REFLEX TO CULTURE (INFECTION SUSPECTED)
Bilirubin Urine: NEGATIVE
Glucose, UA: NEGATIVE mg/dL
Hgb urine dipstick: NEGATIVE
Ketones, ur: NEGATIVE mg/dL
Nitrite: NEGATIVE
Protein, ur: NEGATIVE mg/dL
Specific Gravity, Urine: 1.023 (ref 1.005–1.030)
Squamous Epithelial / HPF: 0 /[HPF] (ref 0–5)
pH: 7 (ref 5.0–8.0)

## 2024-01-21 MED ORDER — HYDROCORTISONE (PERIANAL) 2.5 % EX CREA
TOPICAL_CREAM | Freq: Four times a day (QID) | CUTANEOUS | Status: DC | PRN
  Administered 2024-02-04: 1 via RECTAL
  Filled 2024-01-21: qty 28.35

## 2024-01-21 NOTE — Progress Notes (Addendum)
 Physical Therapy Treatment Patient Details Name: Meghan Jarvis MRN: [id number] DOB: 15-May-1976 Today's Date: 01/21/2024   History of Present Illness Pt is a 48 year old female admitted with [facility] with acute hypoxic respiratory failure, severe sepsis, community acquired PNA acute metabolic encephalopathy; She was intubated, extubated 12/30    PMH significant for alcohol abuse, cocaine abuse, homelessness, AIDS, atrial fibrillation, hypothyroidism, type 2 diabetes mellitus    PT Comments  Patient walked with and without a device. Balance, posture, and gait pattern improved with using rolling walker which was recommended for safety. No change reported in the right hip/low back pain with or without the rolling walker. PT will continue to follow.    If plan is discharge home, recommend the following: Assistance with cooking/housework;Direct supervision/assist for financial management;Direct supervision/assist for medications management;Assist for transportation;Help with stairs or ramp for entrance;Supervision due to cognitive status;A little help with bathing/dressing/bathroom   Can travel by private vehicle     Yes  Equipment Recommendations  Rolling walker (2 wheels);Rollator (4 wheels)    Recommendations for Other Services       Precautions / Restrictions Precautions Precautions: Fall Restrictions Weight Bearing Restrictions Per Provider Order: No     Mobility  Bed Mobility Overal bed mobility: Modified Independent                  Transfers Overall transfer level: Needs assistance Equipment used: None Transfers: Sit to/from Stand Sit to Stand: Supervision           General transfer comment: supervision for standing from chair with arms in room    Ambulation/Gait Ambulation/Gait assistance: Contact guard assist, Supervision Gait Distance (Feet): 40 Feet (and 52ft) Assistive device: Rolling walker (2 wheels), None Gait Pattern/deviations: Step-through pattern,  Trunk flexed, Narrow base of support, Antalgic Gait velocity: decreased     General Gait Details: patient walked without the rolling walker with short step length and flexed posture and reaching out for furniture intermittently for support. encouraged patient to trial rolling walker for off loading RLE for comfort. improved gait pattern and balance with use of rolling walker (no reported change with pain). encouraged patient to use the rolling walker for safety and fall prevention with ambulation   Stairs             Wheelchair Mobility     Tilt Bed    Modified Rankin (Stroke Patients Only)       Balance Overall balance assessment: Needs assistance Sitting-balance support: Feet supported Sitting balance-Leahy Scale: Good     Standing balance support: No upper extremity supported Standing balance-Leahy Scale: Fair                              Cognition Arousal: Alert Behavior During Therapy: WFL for tasks assessed/performed Overall Cognitive Status: No family/caregiver present to determine baseline cognitive functioning                                 General Comments: patient is able to follow single step commands consistently        Exercises      General Comments        Pertinent Vitals/Pain Pain Assessment Pain Assessment: Faces Faces Pain Scale: Hurts little more Pain Location: R hip, right side of lower back Pain Descriptors / Indicators: Discomfort Pain Intervention(s): Monitored during session, Limited activity within patient's tolerance, Repositioned  Home Living                          Prior Function            PT Goals (current goals can now be found in the care plan section) Acute Rehab PT Goals Patient Stated Goal: to go home PT Goal Formulation: With patient Time For Goal Achievement: 02/04/24 Potential to Achieve Goals: Fair Progress towards PT goals: Progressing toward goals (care plan  extended x 2 weeks)    Frequency    Min 1X/week      PT Plan      Co-evaluation              AM-PAC PT "6 Clicks" Mobility   Outcome Measure  Help needed turning from your back to your side while in a flat bed without using bedrails?: None Help needed moving from lying on your back to sitting on the side of a flat bed without using bedrails?: None Help needed moving to and from a bed to a chair (including a wheelchair)?: A Little Help needed standing up from a chair using your arms (e.g., wheelchair or bedside chair)?: A Little Help needed to walk in hospital room?: A Little Help needed climbing 3-5 steps with a railing? : A Little 6 Click Score: 20    End of Session   Activity Tolerance: Patient tolerated treatment well Patient left: in bed;with call bell/phone within reach Nurse Communication: Mobility status PT Visit Diagnosis: Other abnormalities of gait and mobility (R26.89);Difficulty in walking, not elsewhere classified (R26.2);Muscle weakness (generalized) (M62.81)     Time: 1610-9604 PT Time Calculation (min) (ACUTE ONLY): 12 min  Charges:    $Gait Training: 8-22 mins PT General Charges $$ ACUTE PT VISIT: 1 Visit                     Ozie Bo, PT, MPT    Erlene Hawks 01/21/2024, 12:39 PM

## 2024-01-21 NOTE — Progress Notes (Signed)
 Mobility Specialist - Progress Note   01/21/24 1608  Mobility  Activity Ambulated with assistance in hallway  Level of Assistance Standby assist, set-up cues, supervision of patient - no hands on  Assistive Device Front wheel walker  Distance Ambulated (ft) 320 ft  Activity Response Tolerated well  Mobility visit 1 Mobility  Mobility Specialist Start Time (ACUTE ONLY) 1509  Mobility Specialist Stop Time (ACUTE ONLY) 1526  Mobility Specialist Time Calculation (min) (ACUTE ONLY) 17 min   Pt standing at the NS upon arrival, utilizing RA. Pt amb two laps around the NS with supervision, often veering to the L when turning head R to talk/ look at [facility]. Pt opted for a seated rest break ~240 ft in to amb, no specific reason for the break. After a ~5 min break, Pt STS to RW and amb to the room. Pt left amb around the room with needs within reach. RN notified.  Versa Gore Mobility Specialist 01/21/24 4:13 PM

## 2024-01-21 NOTE — Progress Notes (Signed)
 PROGRESS NOTE    Meghan Jarvis   ZOX:[id number] DOB: 1976-10-28  DOA: 12/08/2023 Date of Service: 01/21/24 which is hospital day 66  PCP: [facility] course / significant events:   HPI: 48 year old female past medical history of alcohol abuse, cocaine abuse, homelessness, AIDS, atrial fibrillation, hypothyroidism, type 2 diabetes mellitus presented to [facility] with acute respiratory failure.  She was found down by a friend and could not wake her up.  She was found to be hypoxic in the 70s.  Since she was obtunded she was intubated and admitted by the critical care service.  He was started on antibiotics for pneumonia.  Urine toxicology positive for cocaine.  Patient extubated on 12/30.  12/12/2023.  Patient feeling okay.  Spoke with patient's friend Meghan Jarvis on the phone.  The patient is dating [name]'s son. 1/2.  Patient complained of chest pain.  Cardiac enzymes negative x 2 and EKG ok. 1/3.  Patient complaining of abdominal pain.RUQ US  negative.  MRI of the brain did not show any toxoplasmosis lesions but did show advanced cerebral atrophy. 1/4.  Patient complaining of headache.  Will give IV magnesium .  Patient walked 3 feet with physical therapy. 1/5.  Blood pressure this morning was low and 2 fluid boluses given.  Started on midodrine . 1/6.  Patient complaining of a lot of abdominal pain.  CT scan does not show any acute process and shows better aeration of right lower lung.  CT did show constipation.  Will start MiraLAX . 1/7.  Patient feeling good today.  Did not complain of any pain.  Hemoglobin 9.1, platelet count up at 311, white blood count 4.1  1/15.  We do not have a TOC plan at this point of time.  TOC reached out to [facility]PS.  Patient complains of right knee pain.  Uric acid 4.4.  X-ray negative. 1/16.  Patient feeling good and offers no complaints today. 1/17.  Patient complains of a slight headache. 1/18.  Patient complains of pain to nursing staff  but not to me. 1/19.  Patient in for Ace bandage for her right knee. 1/21.  I walked patient around the bed to the chair.  She used a walker.  Will give a trial of Flexeril  since patient's pain in her right flank. 1/22: Little agitated due to difficult blood drawn. 1/23: Patient refusing to answer orientation questions and becoming agitated.  Looks like does not have capacity to make decision but not cooperative for any proper evaluation. 1/24: Giving some IV fluid and also started on low-dose metoprolol  due to persistent mild tachycardia. 1/28: Psych reevaluated her, patient has capacity to make medical decision but need assistance with housing and finances due to cognitive decline.     Consultants:  Infectious disease   Procedures/Surgeries: none      ASSESSMENT & PLAN:   Acute metabolic encephalopathy-improved Mental status improved from admission but patient is not the best historian and does not capacity to make decisions at this time.  DSS referral by TOC.  MRI did not show any enhancing lesions but did show severe cerebral atrophy.  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 Mental status improved Have been seen by psychiatry and found to have capacity to make her own decision  Continue to monitor mental status TOC working on placement   abdominal pain Patient intermittently complains of abdominal pain, no change in character.  Prior imaging is negative.  Patient having bowel movements.  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  Infectious disease following.  Placed on PCP and MAI prophylaxis with atovaquone  and Zithromax .  Toxoplasma antibody IgG came back elevated and MRI of the brain does not show any enhancing lesions.  Infectious disease specialist started on Biktarvy .  Repeat viral load down to 34,300.  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  Platelet platelet count up in the normal range at 285, white blood cell count up in the normal range at 6.6, hemoglobin 8.1.   Protein-calorie malnutrition, severe Continue supplementation   Severe sepsis (HCC) Present on admission with tachycardia, tachypnea, fever, pneumonia, acute respiratory failure and acute metabolic encephalopathy.  Patient completed antibiotics.   Cocaine abuse (HCC) Patient has been counseled on cessation of cocaine use   Pneumonia of right lower lobe due to infectious organism Completed 5 days of antibiotic.   Acute hypoxic respiratory failure (HCC) Resolved.  Patient extubated on 12/30.  Patient currently breathing on room air.   Alcohol abuse Continue thiamine  multivitamin and folic acid    Uncontrolled type 2 diabetes mellitus with hypoglycemia, without long-term current use of insulin  (HCC) Hemoglobin A1c 6.0.    Generalized weakness Continue PT OT   Chest pain Atypical in nature.  Troponin x 2 negative.  EKG did not show any ST elevations.  Repeat EKG on 1/17 did not show any ST elevations.   AKI (acute kidney injury) (HCC) Continue monitoring Encouraged on adequate hydration   Right knee pain Patient asked for Ace bandage. She has not been wearing this. Intermittently complains of knee pain. Right knee x-ray negative.  Uric acid 4.4.  Continue to monitor.   Underweight (BMI < 18.5) Last BMI 18.54   Diarrhea-resolved continue as needed Colace for any constipation    Borderline underweight based on BMI: Body mass index is 19.51 kg/m.  Underweight - under 18  overweight - 25 to 29 obese - 30 or more Class 1 obesity: BMI of 30.0 to 34 Class 2 obesity: BMI of 35.0 to 39 Class 3 obesity: BMI of 40.0 to 49 Super Morbid Obesity: BMI 50-59 Super-super Morbid Obesity: BMI 60+ Significantly low or high BMI is associated with higher  medical risk.  Weight management advised as adjunct to other disease management and risk reduction treatments    DVT prophylaxis: lovenox   IV fluids: no continuous IV fluids  Nutrition: dysphagia Central lines / invasive devices: none  Code Status: FULL CODE  ACP documentation reviewed:  none on file in [facility]  TOC needs: placement Barriers to dispo / significant pending items: placement              Subjective / Brief ROS:  Patient reports rectal pain w/ defecation and small amount of blood  No concerns from RN   Family Communication: none    Objective Findings:  Vitals:   01/20/24 2003 01/21/24 0500 01/21/24 0506 01/21/24 0811  BP: (!) 124/59  111/82 102/73  Pulse: 100  92 86  Resp: 18  18 18   Temp: 98.4 F (36.9 C)  97.9 F (36.6 C) 98 F (36.7 C)  TempSrc:      SpO2: 100%  99% 96%  Weight:  62.3 kg    Height:        Intake/Output Summary (Last 24 hours) at 01/21/2024 1405 Last data filed at 01/20/2024  1918 Gross per 24 hour  Intake 0 ml  Output --  Net 0 ml   Filed Weights   01/17/24 0500 01/20/24 0500 01/21/24 0500  Weight: 56.8 kg 61 kg 62.3 kg    Examination:  Physical Exam Constitutional:      General: She is not in acute distress. Cardiovascular:     Rate and Rhythm: Regular rhythm.  Pulmonary:     Effort: Pulmonary effort is normal.  Abdominal:     Comments: Rectal - brown stool, scant BRB, no mass noted, small skin flap c/w mild hemorrhoid, no thrombosis or fissure  Neurological:     Mental Status: She is alert. Mental status is at baseline.  Psychiatric:        Mood and Affect: Mood normal.        Behavior: Behavior normal.          Scheduled Medications:   atovaquone   1,500 mg Oral Q breakfast   azithromycin   1,200 mg Oral Weekly   bictegravir-emtricitabine -tenofovir  AF  1 tablet Oral Daily   divalproex   500 mg Oral QHS   docusate sodium   100 mg Oral BID   DULoxetine   30 mg Oral QHS   enoxaparin  (LOVENOX ) injection   40 mg Subcutaneous QHS   feeding supplement  237 mL Oral QID   folic acid   1 mg Oral Daily   hydrocerin   Topical BID   lidocaine   1 patch Transdermal Q24H   midodrine   10 mg Oral TID WC   multivitamin with minerals  1 tablet Oral QHS   pantoprazole   40 mg Oral BID   thiamine   100 mg Oral Daily    Continuous Infusions:   PRN Medications:  acetaminophen , cyclobenzaprine , dicyclomine , diphenhydrAMINE , hydrocortisone , ipratropium-albuterol , lactulose , nicotine  polacrilex, oxyCODONE , polyethylene glycol  Antimicrobials from admission:  Anti-infectives (From admission, onward)    Start     Dose/Rate Route Frequency Ordered Stop   12/25/23 0800  atovaquone  (MEPRON ) 750 MG/5ML suspension 1,500 mg        1,500 mg Oral Daily with breakfast 12/24/23 1558     12/24/23 1000  sulfamethoxazole -trimethoprim  (BACTRIM ) 400-80 MG per tablet 1 tablet  Status:  Discontinued        1 tablet Oral Daily 12/24/23 0749 12/24/23 1557   12/19/23 1400  azithromycin  (ZITHROMAX ) tablet 1,200 mg        1,200 mg Oral Weekly 12/18/23 1346     12/18/23 1000  sulfamethoxazole -trimethoprim  (BACTRIM  DS) 800-160 MG per tablet 1 tablet  Status:  Discontinued        1 tablet Oral Daily 12/17/23 1621 12/24/23 0748   12/14/23 1530  bictegravir-emtricitabine -tenofovir  AF (BIKTARVY ) 50-200-25 MG per tablet 1 tablet        1 tablet Oral Daily 12/14/23 1442     12/12/23 1000  amoxicillin -clavulanate (AUGMENTIN ) 875-125 MG per tablet 1 tablet        1 tablet Oral Every 12 hours 12/11/23 1345 12/12/23 2131   12/12/23 1000  azithromycin  (ZITHROMAX ) tablet 1,250 mg  Status:  Discontinued        1,200 mg Oral Weekly 12/11/23 1618 12/18/23 1346   12/11/23 2000  sulfamethoxazole -trimethoprim  (BACTRIM ) 400-80 MG per tablet 1 tablet  Status:  Discontinued        1 tablet Oral Daily 12/11/23 1618 12/17/23 1621   12/09/23 2200  azithromycin  (ZITHROMAX ) 500 mg in sodium chloride  0.9 % 250 mL IVPB  Status:  Discontinued        500  mg 250 mL/hr  over 60 Minutes Intravenous Every 24 hours 12/09/23 0734 12/11/23 1618   12/09/23 2000  cefTRIAXone  (ROCEPHIN ) 2 g in sodium chloride  0.9 % 100 mL IVPB  Status:  Discontinued        2 g 200 mL/hr over 30 Minutes Intravenous Every 24 hours 12/09/23 0953 12/09/23 1840   12/09/23 2000  piperacillin -tazobactam (ZOSYN ) IVPB 3.375 g        3.375 g 12.5 mL/hr over 240 Minutes Intravenous Every 8 hours 12/09/23 1850 12/12/23 0037   12/09/23 0600  Ampicillin -Sulbactam (UNASYN ) 3 g in sodium chloride  0.9 % 100 mL IVPB  Status:  Discontinued        3 g 200 mL/hr over 30 Minutes Intravenous Every 6 hours 12/09/23 0442 12/09/23 0953   12/08/23 2215  cefTRIAXone  (ROCEPHIN ) 2 g in sodium chloride  0.9 % 100 mL IVPB        2 g 200 mL/hr over 30 Minutes Intravenous Once 12/08/23 2214 12/08/23 2308   12/08/23 2215  azithromycin  (ZITHROMAX ) 500 mg in sodium chloride  0.9 % 250 mL IVPB        500 mg 250 mL/hr over 60 Minutes Intravenous  Once 12/08/23 2214 12/09/23 0000           Data Reviewed:  I have personally reviewed the following...  CBC: Recent Labs  Lab 01/20/24 0508  WBC 6.4  HGB 9.5*  HCT 30.6*  MCV 89.0  PLT 279    Basic Metabolic Panel: Recent Labs  Lab 01/20/24 0508  NA 139  K 4.3  CL 103  CO2 27  GLUCOSE 96  BUN 40*  CREATININE 0.78  CALCIUM  9.5    GFR: Estimated Creatinine Clearance: 83.7 mL/min (by C-G formula based on SCr of 0.78 mg/dL). Liver Function Tests: No results for input(s): "AST", "ALT", "ALKPHOS", "BILITOT", "PROT", "ALBUMIN" in the last 168 hours.  No results for input(s): "LIPASE", "AMYLASE" in the last 168 hours. No results for input(s): "AMMONIA" in the last 168 hours. Coagulation Profile: No results for input(s): "INR", "PROTIME" in the last 168 hours. Cardiac Enzymes: No results for input(s): "CKTOTAL", "CKMB", "CKMBINDEX", "TROPONINI" in the last 168 hours. BNP (last 3 results) No results for input(s): "PROBNP" in the last  8760 hours. HbA1C: No results for input(s): "HGBA1C" in the last 72 hours. CBG: No results for input(s): "GLUCAP" in the last 168 hours. Lipid Profile: No results for input(s): "CHOL", "HDL", "LDLCALC", "TRIG", "CHOLHDL", "LDLDIRECT" in the last 72 hours. Thyroid Function Tests: No results for input(s): "TSH", "T4TOTAL", "FREET4", "T3FREE", "THYROIDAB" in the last 72 hours. Anemia Panel: No results for input(s): "VITAMINB12", "FOLATE", "FERRITIN", "TIBC", "IRON", "RETICCTPCT" in the last 72 hours. Most Recent Urinalysis On File:     Component Value Date/Time   COLORURINE YELLOW (A) 01/21/2024 1215   APPEARANCEUR HAZY (A) 01/21/2024 1215   APPEARANCEUR Clear 08/18/2014 1354   LABSPEC 1.023 01/21/2024 1215   LABSPEC 1.020 08/18/2014 1354   PHURINE 7.0 01/21/2024 1215   GLUCOSEU NEGATIVE 01/21/2024 1215   GLUCOSEU 50 mg/dL 96/03/5408 8119   HGBUR NEGATIVE 01/21/2024 1215   BILIRUBINUR NEGATIVE 01/21/2024 1215   BILIRUBINUR Negative 08/18/2014 1354   KETONESUR NEGATIVE 01/21/2024 1215   PROTEINUR NEGATIVE 01/21/2024 1215   NITRITE NEGATIVE 01/21/2024 1215   LEUKOCYTESUR MODERATE (A) 01/21/2024 1215   LEUKOCYTESUR Negative 08/18/2014 1354   Sepsis Labs: @LABRCNTIP (procalcitonin:4,lacticidven:4) Microbiology: No results found for this or any previous visit (from the past 240 hours).    Radiology Studies last 3 days: No results found.  Tattiana Fakhouri, DO [facility] 01/21/2024, 2:05 PM    Dictation software may have been used to generate the above note. Typos may occur and escape review in typed/dictated notes. Please contact Dr Authur Leghorn directly for clarity if needed.  Staff may message me via secure chat in Epic  but this may not receive an immediate response,  please page me for urgent matters!  If 7PM-7AM, please contact night coverage [email or web address]

## 2024-01-21 NOTE — Plan of Care (Signed)
  Problem: Coping: Goal: Ability to adjust to condition or change in health will improve Outcome: Progressing   Problem: Fluid Volume: Goal: Ability to maintain a balanced intake and output will improve Outcome: Progressing   Problem: Health Behavior/Discharge Planning: Goal: Ability to identify and utilize available resources and services will improve Outcome: Progressing Goal: Ability to manage health-related needs will improve Outcome: Progressing   Problem: Metabolic: Goal: Ability to maintain appropriate glucose levels will improve Outcome: Progressing   Problem: Nutritional: Goal: Maintenance of adequate nutrition will improve Outcome: Progressing Goal: Progress toward achieving an optimal weight will improve Outcome: Progressing   Problem: Skin Integrity: Goal: Risk for impaired skin integrity will decrease Outcome: Progressing   Problem: Tissue Perfusion: Goal: Adequacy of tissue perfusion will improve Outcome: Progressing   Problem: Education: Goal: Knowledge of General Education information will improve Description: Including pain rating scale, medication(s)/side effects and non-pharmacologic comfort measures Outcome: Progressing   Problem: Health Behavior/Discharge Planning: Goal: Ability to manage health-related needs will improve Outcome: Progressing   Problem: Clinical Measurements: Goal: Ability to maintain clinical measurements within normal limits will improve Outcome: Progressing Goal: Will remain free from infection Outcome: Progressing Goal: Diagnostic test results will improve Outcome: Progressing Goal: Respiratory complications will improve Outcome: Progressing Goal: Cardiovascular complication will be avoided Outcome: Progressing   Problem: Activity: Goal: Risk for activity intolerance will decrease Outcome: Progressing   Problem: Nutrition: Goal: Adequate nutrition will be maintained Outcome: Progressing   Problem: Coping: Goal:  Level of anxiety will decrease Outcome: Progressing   Problem: Elimination: Goal: Will not experience complications related to bowel motility Outcome: Progressing Goal: Will not experience complications related to urinary retention Outcome: Progressing   Problem: Pain Management: Goal: General experience of comfort will improve Outcome: Progressing   Problem: Safety: Goal: Ability to remain free from injury will improve Outcome: Progressing   Problem: Skin Integrity: Goal: Risk for impaired skin integrity will decrease Outcome: Progressing   Problem: Activity: Goal: Ability to tolerate increased activity will improve Outcome: Progressing   Problem: Respiratory: Goal: Ability to maintain a clear airway and adequate ventilation will improve Outcome: Progressing

## 2024-01-22 DIAGNOSIS — J9601 Acute respiratory failure with hypoxia: Secondary | ICD-10-CM | POA: Diagnosis not present

## 2024-01-22 DIAGNOSIS — G9341 Metabolic encephalopathy: Secondary | ICD-10-CM | POA: Diagnosis not present

## 2024-01-22 DIAGNOSIS — B20 Human immunodeficiency virus [HIV] disease: Secondary | ICD-10-CM | POA: Diagnosis not present

## 2024-01-22 NOTE — Progress Notes (Signed)
PROGRESS NOTE    Meghan Jarvis   FAO:[id number] DOB: September 27, 1976  DOA: 12/08/2023 Date of Service: 01/22/24 which is hospital day 45  PCP: [facility] course / significant events:   HPI: 48 year old female past medical history of alcohol abuse, cocaine abuse, homelessness, AIDS, atrial fibrillation, hypothyroidism, type 2 diabetes mellitus presented to [facility] with acute respiratory failure.  She was found down by a friend and could not wake her up.  She was found to be hypoxic in the 70s.  Since she was obtunded she was intubated and admitted by the critical care service.  He was started on antibiotics for pneumonia.  Urine toxicology positive for cocaine.  Patient extubated on 12/30.  12/12/2023.  Patient feeling okay.  Spoke with patient's friend Meghan Jarvis on the phone.  The patient is dating [name]'s son. 1/2.  Patient complained of chest pain.  Cardiac enzymes negative x 2 and EKG ok. 1/3.  Patient complaining of abdominal pain.RUQ [place] negative.  MRI of the brain did not show any toxoplasmosis lesions but did show advanced cerebral atrophy. 1/4.  Patient complaining of headache.  Will give IV magnesium.  Patient walked 3 feet with physical therapy. 1/5.  Blood pressure this morning was low and 2 fluid boluses given.  Started on midodrine. 1/6.  Patient complaining of a lot of abdominal pain.  CT scan does not show any acute process and shows better aeration of right lower lung.  CT did show constipation.  Will start MiraLAX. 1/7.  Patient feeling good today.  Did not complain of any pain.  Hemoglobin 9.1, platelet count up at 311, white blood count 4.1  1/15.  We do not have a TOC plan at this point of time.  TOC reached out to APS.  Patient complains of right knee pain.  Uric acid 4.4.  X-ray negative. 1/16.  Patient feeling good and offers no complaints today. 1/17.  Patient complains of a slight headache. 1/18.  Patient complains of pain to nursing staff  but not to me. 1/19.  Patient in for Ace bandage for her right knee. 1/21.  I walked patient around the bed to the chair.  She used a walker.  Will give a trial of Flexeril since patient's pain in her right flank. 1/22: Little agitated due to difficult blood drawn. 1/23: Patient refusing to answer orientation questions and becoming agitated.  Looks like does not have capacity to make decision but not cooperative for any proper evaluation. 1/24: Giving some IV fluid and also started on low-dose metoprolol due to persistent mild tachycardia. 1/28: Psych reevaluated her, patient has capacity to make medical decision but need assistance with housing and finances due to cognitive decline.     Consultants:  Infectious disease   Procedures/Surgeries: none      ASSESSMENT & PLAN:   Acute metabolic encephalopathy-improved Mental status improved from admission but patient is not the best historian and does not capacity to make decisions at this time.  DSS referral by TOC.  MRI did not show any enhancing lesions but did show severe cerebral atrophy.  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 Mental status improved Have been seen by psychiatry and found to have capacity to make her own decision  Continue to monitor mental status TOC working on placement   abdominal pain Patient intermittently complains of abdominal pain, no change in character.  Prior imaging is negative.  Patient having bowel movements.  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  Infectious disease following.  Placed on PCP and MAI prophylaxis with atovaquone and Zithromax.  Toxoplasma antibody IgG came back elevated and MRI of the brain does not show any enhancing lesions.  Infectious disease specialist started on Biktarvy.  Repeat viral load down to 34,300.  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  Platelet platelet count up in the normal range at 285, white blood cell count up in the normal range at 6.6, hemoglobin 8.1.   Protein-calorie malnutrition, severe Continue supplementation   Severe sepsis (HCC) Present on admission with tachycardia, tachypnea, fever, pneumonia, acute respiratory failure and acute metabolic encephalopathy.  Patient completed antibiotics.   Cocaine abuse (HCC) Patient has been counseled on cessation of cocaine use   Pneumonia of right lower lobe due to infectious organism Completed 5 days of antibiotic.   Acute hypoxic respiratory failure (HCC) Resolved.  Patient extubated on 12/30.  Patient currently breathing on room air.   Alcohol abuse Continue thiamine multivitamin and folic acid   Uncontrolled type 2 diabetes mellitus with hypoglycemia, without long-term current use of insulin (HCC) Hemoglobin A1c 6.0.    Generalized weakness Continue PT OT   Chest pain Atypical in nature.  Troponin x 2 negative.  EKG did not show any ST elevations.  Repeat EKG on 1/17 did not show any ST elevations.   AKI (acute kidney injury) (HCC) Continue monitoring Encouraged on adequate hydration   Right knee pain Patient asked for Ace bandage. She has not been wearing this. Intermittently complains of knee pain. Right knee x-ray negative.  Uric acid 4.4.  Continue to monitor.   Underweight (BMI < 18.5) Last BMI 18.54   Diarrhea-resolved continue as needed Colace for any constipation  Rectal pain and scant BRBPR Mild hemorrhoid on exam Anusol cream prn   Borderline underweight based on BMI: Body mass index is 19.51 kg/m.  Underweight - under 18  overweight - 25 to 29 obese - 30 or more Class 1 obesity: BMI of 30.0 to 34 Class 2 obesity: BMI of 35.0 to 39 Class 3 obesity: BMI of 40.0 to 49 Super Morbid Obesity: BMI 50-59 Super-super Morbid  Obesity: BMI 60+ Significantly low or high BMI is associated with higher medical risk.  Weight management advised as adjunct to other disease management and risk reduction treatments    DVT prophylaxis: lovenox  IV fluids: no continuous IV fluids  Nutrition: dysphagia Central lines / invasive devices: none  Code Status: FULL CODE  ACP documentation reviewed:  none on file in [facility]  TOC needs: placement Barriers to dispo / significant pending items: placement              Subjective / Brief ROS:  Patient reports no concerns today   Family Communication: none    Objective Findings:  Vitals:   01/21/24 2010 01/22/24 0534 01/22/24 0641 01/22/24 0932  BP: 113/83 105/78  (!) 89/63  Pulse: 89 87  89  Resp: 18 18  18   Temp: 98.2 F (36.8 C) 97.8 F (36.6 C)  98.2 F (36.8 C)  TempSrc:      SpO2: 100% 100%  100%  Weight:   62.9 kg   Height:        Intake/Output Summary (Last 24 hours) at 01/22/2024 1323 Last data filed at  01/22/2024 1055 Gross per 24 hour  Intake 0 ml  Output --  Net 0 ml   Filed Weights   01/20/24 0500 01/21/24 0500 01/22/24 0641  Weight: 61 kg 62.3 kg 62.9 kg    Examination:  Physical Exam Constitutional:      General: She is not in acute distress. Cardiovascular:     Rate and Rhythm: Regular rhythm.  Pulmonary:     Effort: Pulmonary effort is normal.  Neurological:     Mental Status: She is alert. Mental status is at baseline.  Psychiatric:        Mood and Affect: Mood normal.        Behavior: Behavior normal.          Scheduled Medications:   atovaquone  1,500 mg Oral Q breakfast   azithromycin  1,200 mg Oral Weekly   bictegravir-emtricitabine-tenofovir AF  1 tablet Oral Daily   docusate sodium  100 mg Oral BID   DULoxetine  30 mg Oral QHS   enoxaparin (LOVENOX) injection  40 mg Subcutaneous QHS   feeding supplement  237 mL Oral QID   folic acid  1 mg Oral Daily   hydrocerin   Topical BID   lidocaine  1 patch  Transdermal Q24H   midodrine  10 mg Oral TID WC   multivitamin with minerals  1 tablet Oral QHS   pantoprazole  40 mg Oral BID   thiamine  100 mg Oral Daily    Continuous Infusions:   PRN Medications:  acetaminophen, cyclobenzaprine, dicyclomine, diphenhydrAMINE, hydrocortisone, ipratropium-albuterol, lactulose, nicotine polacrilex, oxyCODONE, polyethylene glycol  Antimicrobials from admission:  Anti-infectives (From admission, onward)    Start     Dose/Rate Route Frequency Ordered Stop   12/25/23 0800  atovaquone (MEPRON) 750 MG/5ML suspension 1,500 mg        1,500 mg Oral Daily with breakfast 12/24/23 1558     12/24/23 1000  sulfamethoxazole-trimethoprim (BACTRIM) 400-80 MG per tablet 1 tablet  Status:  Discontinued        1 tablet Oral Daily 12/24/23 0749 12/24/23 1557   12/19/23 1400  azithromycin (ZITHROMAX) tablet 1,200 mg        1,200 mg Oral Weekly 12/18/23 1346     12/18/23 1000  sulfamethoxazole-trimethoprim (BACTRIM DS) 800-160 MG per tablet 1 tablet  Status:  Discontinued        1 tablet Oral Daily 12/17/23 1621 12/24/23 0748   12/14/23 1530  bictegravir-emtricitabine-tenofovir AF (BIKTARVY) 50-200-25 MG per tablet 1 tablet        1 tablet Oral Daily 12/14/23 1442     12/12/23 1000  amoxicillin-clavulanate (AUGMENTIN) 875-125 MG per tablet 1 tablet        1 tablet Oral Every 12 hours 12/11/23 1345 12/12/23 2131   12/12/23 1000  azithromycin (ZITHROMAX) tablet 1,250 mg  Status:  Discontinued        1,200 mg Oral Weekly 12/11/23 1618 12/18/23 1346   12/11/23 2000  sulfamethoxazole-trimethoprim (BACTRIM) 400-80 MG per tablet 1 tablet  Status:  Discontinued        1 tablet Oral Daily 12/11/23 1618 12/17/23 1621   12/09/23 2200  azithromycin (ZITHROMAX) 500 mg in sodium chloride 0.9 % 250 mL IVPB  Status:  Discontinued        500 mg 250 mL/hr over 60 Minutes Intravenous Every 24 hours 12/09/23 0734 12/11/23 1618   12/09/23 2000  cefTRIAXone (ROCEPHIN) 2 g in sodium chloride  0.9 % 100 mL IVPB  Status:  Discontinued  2 g 200 mL/hr over 30 Minutes Intravenous Every 24 hours 12/09/23 0953 12/09/23 1840   12/09/23 2000  piperacillin-tazobactam (ZOSYN) IVPB 3.375 g        3.375 g 12.5 mL/hr over 240 Minutes Intravenous Every 8 hours 12/09/23 1850 12/12/23 0037   12/09/23 0600  Ampicillin-Sulbactam (UNASYN) 3 g in sodium chloride 0.9 % 100 mL IVPB  Status:  Discontinued        3 g 200 mL/hr over 30 Minutes Intravenous Every 6 hours 12/09/23 0442 12/09/23 0953   12/08/23 2215  cefTRIAXone (ROCEPHIN) 2 g in sodium chloride 0.9 % 100 mL IVPB        2 g 200 mL/hr over 30 Minutes Intravenous Once 12/08/23 2214 12/08/23 2308   12/08/23 2215  azithromycin (ZITHROMAX) 500 mg in sodium chloride 0.9 % 250 mL IVPB        500 mg 250 mL/hr over 60 Minutes Intravenous  Once 12/08/23 2214 12/09/23 0000           Data Reviewed:  I have personally reviewed the following...  CBC: Recent Labs  Lab 01/20/24 0508  WBC 6.4  HGB 9.5*  HCT 30.6*  MCV 89.0  PLT 279    Basic Metabolic Panel: Recent Labs  Lab 01/20/24 0508  NA 139  K 4.3  CL 103  CO2 27  GLUCOSE 96  BUN 40*  CREATININE 0.78  CALCIUM 9.5    GFR: Estimated Creatinine Clearance: 84.5 mL/min (by C-G formula based on SCr of 0.78 mg/dL). Liver Function Tests: No results for input(s): "AST", "ALT", "ALKPHOS", "BILITOT", "PROT", "ALBUMIN" in the last 168 hours.  No results for input(s): "LIPASE", "AMYLASE" in the last 168 hours. No results for input(s): "AMMONIA" in the last 168 hours. Coagulation Profile: No results for input(s): "INR", "PROTIME" in the last 168 hours. Cardiac Enzymes: No results for input(s): "CKTOTAL", "CKMB", "CKMBINDEX", "TROPONINI" in the last 168 hours. BNP (last 3 results) No results for input(s): "PROBNP" in the last 8760 hours. HbA1C: No results for input(s): "HGBA1C" in the last 72 hours. CBG: No results for input(s): "GLUCAP" in the last 168 hours. Lipid  Profile: No results for input(s): "CHOL", "HDL", "LDLCALC", "TRIG", "CHOLHDL", "LDLDIRECT" in the last 72 hours. Thyroid Function Tests: No results for input(s): "TSH", "T4TOTAL", "FREET4", "T3FREE", "THYROIDAB" in the last 72 hours. Anemia Panel: No results for input(s): "VITAMINB12", "FOLATE", "FERRITIN", "TIBC", "IRON", "RETICCTPCT" in the last 72 hours. Most Recent Urinalysis On File:     Component Value Date/Time   COLORURINE YELLOW (A) 01/21/2024 1215   APPEARANCEUR HAZY (A) 01/21/2024 1215   APPEARANCEUR Clear 08/18/2014 1354   LABSPEC 1.023 01/21/2024 1215   LABSPEC 1.020 08/18/2014 1354   PHURINE 7.0 01/21/2024 1215   GLUCOSEU NEGATIVE 01/21/2024 1215   GLUCOSEU 50 mg/dL 86/57/8469 6295   HGBUR NEGATIVE 01/21/2024 1215   BILIRUBINUR NEGATIVE 01/21/2024 1215   BILIRUBINUR Negative 08/18/2014 1354   KETONESUR NEGATIVE 01/21/2024 1215   PROTEINUR NEGATIVE 01/21/2024 1215   NITRITE NEGATIVE 01/21/2024 1215   LEUKOCYTESUR MODERATE (A) 01/21/2024 1215   LEUKOCYTESUR Negative 08/18/2014 1354   Sepsis Labs: @LABRCNTIP (procalcitonin:4,lacticidven:4) Microbiology: No results found for this or any previous visit (from the past 240 hours).    Radiology Studies last 3 days: No results found.        Sunnie Nielsen, DO [facility] 01/22/2024, 1:23 PM    Dictation software may have been used to generate the above note. Typos may occur and escape review in typed/dictated notes. Please contact  Dr Lyn Hollingshead directly for clarity if needed.  Staff may message me via secure chat in Epic  but this may not receive an immediate response,  please page me for urgent matters!  If 7PM-7AM, please contact night coverage [email or web address]

## 2024-01-22 NOTE — Plan of Care (Signed)
  Problem: Coping: Goal: Ability to adjust to condition or change in health will improve Outcome: Progressing   Problem: Fluid Volume: Goal: Ability to maintain a balanced intake and output will improve Outcome: Progressing   Problem: Health Behavior/Discharge Planning: Goal: Ability to identify and utilize available resources and services will improve Outcome: Progressing Goal: Ability to manage health-related needs will improve Outcome: Progressing   Problem: Metabolic: Goal: Ability to maintain appropriate glucose levels will improve Outcome: Progressing   Problem: Nutritional: Goal: Maintenance of adequate nutrition will improve Outcome: Progressing Goal: Progress toward achieving an optimal weight will improve Outcome: Progressing   Problem: Skin Integrity: Goal: Risk for impaired skin integrity will decrease Outcome: Progressing   Problem: Tissue Perfusion: Goal: Adequacy of tissue perfusion will improve Outcome: Progressing   Problem: Education: Goal: Knowledge of General Education information will improve Description: Including pain rating scale, medication(s)/side effects and non-pharmacologic comfort measures Outcome: Progressing   Problem: Health Behavior/Discharge Planning: Goal: Ability to manage health-related needs will improve Outcome: Progressing   Problem: Clinical Measurements: Goal: Ability to maintain clinical measurements within normal limits will improve Outcome: Progressing Goal: Will remain free from infection Outcome: Progressing Goal: Diagnostic test results will improve Outcome: Progressing Goal: Respiratory complications will improve Outcome: Progressing Goal: Cardiovascular complication will be avoided Outcome: Progressing   Problem: Activity: Goal: Risk for activity intolerance will decrease Outcome: Progressing   Problem: Nutrition: Goal: Adequate nutrition will be maintained Outcome: Progressing   Problem: Coping: Goal:  Level of anxiety will decrease Outcome: Progressing   Problem: Elimination: Goal: Will not experience complications related to bowel motility Outcome: Progressing Goal: Will not experience complications related to urinary retention Outcome: Progressing   Problem: Pain Management: Goal: General experience of comfort will improve Outcome: Progressing   Problem: Safety: Goal: Ability to remain free from injury will improve Outcome: Progressing   Problem: Skin Integrity: Goal: Risk for impaired skin integrity will decrease Outcome: Progressing   Problem: Activity: Goal: Ability to tolerate increased activity will improve Outcome: Progressing   Problem: Respiratory: Goal: Ability to maintain a clear airway and adequate ventilation will improve Outcome: Progressing

## 2024-01-22 NOTE — TOC Progression Note (Signed)
Transition of Care [facility]) - Progression Note    Patient Details  Name: DAINE CROKER MRN: [id number] Date of Birth: [birth year]  [facility] CM/SW Contact  Allena Katz, LCSW Phone Number: 01/22/2024, 2:21 PM  Clinical Narrative:  CSW spoke with [facility]ucker who states she may be able to take pt but wants to speak with her tomorrow. Meriam Sprague and CSW to do conference call with patient to determine if beverly will accept her.          Expected Discharge Plan and Services         Expected Discharge Date: 01/10/24                                     Social Determinants of Health (SDOH) Interventions SDOH Screenings   Food Insecurity: Patient Unable To Answer (12/09/2023)  Recent Concern: Food Insecurity - Food Insecurity Present (09/26/2023)  Housing: Patient Unable To Answer (12/09/2023)  Recent Concern: Housing - Medium Risk (09/26/2023)  Transportation Needs: Patient Unable To Answer (12/09/2023)  Recent Concern: Transportation Needs - Unmet Transportation Needs (10/11/2023)  Utilities: Patient Unable To Answer (12/09/2023)  Recent Concern: Utilities - At Risk (09/25/2023)  Tobacco Use: High Risk (12/08/2023)    Readmission Risk Interventions     No data to display

## 2024-01-22 NOTE — Progress Notes (Signed)
Nutrition Follow-up  DOCUMENTATION CODES:   Severe malnutrition in context of chronic illness  INTERVENTION:   -Continue Ensure Enlive po QID, each supplement provides 350 kcal and 20 grams of protein.  -Continue MVI with minerals daily -Continue 1 mg folic acid daily -Continue 161 mg thiamine daily -Continue Magic cup TID with meals, each supplement provides 290 kcal and 9 grams of protein  -RD will sign off; if further nutrition related concerns arise, please re-consult RD  NUTRITION DIAGNOSIS:   Severe Malnutrition related to chronic illness (HIV, homelessness, substance abuse) as evidenced by percent weight loss, severe fat depletion, severe muscle depletion.  Ongoing  GOAL:   Patient will meet greater than or equal to 90% of their needs  Progressing  MONITOR:   PO intake, Supplement acceptance, Labs, Weight trends, I & O's, Skin  REASON FOR ASSESSMENT:   Consult Enteral/tube feeding initiation and management  ASSESSMENT:   48 y/o female with h/o homelessness, HIV, substance abuse, Afib, mood disorder, hypothyroidism, type 2 diabetes mellitus and depression who is admitted with aspiration PNA.  Reviewed I/O's: 0 ml x 24 hours and +1 L since 01/08/24   Per psych notes on 01/08/24, pt with moderate cognitive decline; pt's decision making capacity can fluctuate.    Pt remains on a dysphagia 3 diet. Pt with improved oral intake. Noted meal completions 0-100%. Pt is consuming Ensure supplements.   Per TOC notes, pt awaiting group home placement. DSS also involved; pt may need guardianship.   Noted wt gain, which is favorable given malnutrition.   Medications reviewed and include biktarvy, colace, lovenox, folic acid, protamine, protonix, and thiamine.    Labs reviewed: CBGS: 103 (inpatient orders for glycemic control are none).    Diet Order:   Diet Order             DIET DYS 3 Room service appropriate? Yes; Fluid consistency: Thin  Diet effective now                    EDUCATION NEEDS:   Not appropriate for education at this time  Skin:  Skin Assessment: Skin Integrity Issues: Skin Integrity Issues:: Other (Comment) Other: fissure wound to gluteal cleft, partial thickness rt lateral knee abrasion, excoriation to linear rt buttock  Last BM:  01/21/24 (type 4)  Height:   Ht Readings from Last 1 Encounters:  12/09/23 5' 7.99" (1.727 m)    Weight:   Wt Readings from Last 1 Encounters:  01/22/24 62.9 kg    Ideal Body Weight:  63.6 kg  BMI:  Body mass index is 21.09 kg/m.  Estimated Nutritional Needs:   Kcal:  1800-2100kcal/day  Protein:  90-105g/day  Fluid:  1.8-2.1L/day    Levada Schilling, RD, LDN, CDCES Registered Dietitian III Certified Diabetes Care and Education Specialist If unable to reach this RD, please use "RD Inpatient" group chat on secure chat between hours of 8am-4 pm daily

## 2024-01-23 DIAGNOSIS — B20 Human immunodeficiency virus [HIV] disease: Secondary | ICD-10-CM | POA: Diagnosis not present

## 2024-01-23 NOTE — Progress Notes (Signed)
Date of Admission:  12/08/2023     ID: Meghan Jarvis is a 48 y.o. female  Principal Problem:   Acute metabolic encephalopathy Active Problems:   Alcohol abuse   Acute hypoxic respiratory failure (HCC)   Pneumonia of right lower lobe due to infectious organism   Protein-calorie malnutrition, severe   Uncontrolled type 2 diabetes mellitus with hypoglycemia, without long-term current use of insulin (HCC)   Cocaine abuse (HCC)   AIDS (HCC)   Severe sepsis (HCC)   Chest pain   Pancytopenia (HCC)   Generalized weakness   Diarrhea   Right upper quadrant abdominal pain   Headache   Underweight (BMI < 18.5)   Hypotension   Abdominal pain   Encounter for psychiatric assessment   Rash due to allergy   Right knee pain   AKI (acute kidney injury) (HCC)  ? Meghan Jarvis is a 48 y.o. with a history of AIDS, not compliant with meds or visits ,  , cocaine use was brought in by EMS after being found unresponsive on the couch  in an aquaintance place-   Subjective: She is doing fine  No complaints Medications:   atovaquone  1,500 mg Oral Q breakfast   azithromycin  1,200 mg Oral Weekly   bictegravir-emtricitabine-tenofovir AF  1 tablet Oral Daily   docusate sodium  100 mg Oral BID   DULoxetine  30 mg Oral QHS   enoxaparin (LOVENOX) injection  40 mg Subcutaneous QHS   feeding supplement  237 mL Oral QID   folic acid  1 mg Oral Daily   hydrocerin   Topical BID   lidocaine  1 patch Transdermal Q24H   midodrine  10 mg Oral TID WC   multivitamin with minerals  1 tablet Oral QHS   pantoprazole  40 mg Oral BID   thiamine  100 mg Oral Daily    Objective: Vital signs in last 24 hours: Patient Vitals for the past 24 hrs:  BP Temp Pulse Resp SpO2  01/23/24 0751 102/71 97.6 F (36.4 C) 83 19 100 %  01/23/24 0508 100/77 98.5 F (36.9 C) 94 -- 99 %  01/22/24 2140 102/77 97.9 F (36.6 C) 92 17 91 %  01/22/24 1642 90/62 98.7 F (37.1 C) -- 18 100 %  Awake and alert Respond to  questions appropriately Hss1s2 Lungs clear to auscultation Abdomen soft Skin- hyperpigmentation, dryness, scaling has improved significantly. CNS grossly non focal   Lab Results    Latest Ref Rng & Units 01/20/2024    5:08 AM 01/12/2024    5:11 AM 01/11/2024    4:21 AM  CBC  WBC 4.0 - 10.5 K/uL 6.4  7.4  5.8   Hemoglobin 12.0 - 15.0 g/dL 9.5  8.4  8.3   Hematocrit 36.0 - 46.0 % 30.6  27.0  26.7   Platelets 150 - 400 K/uL 279  263  257        Latest Ref Rng & Units 01/20/2024    5:08 AM 01/12/2024    5:11 AM 01/11/2024    4:21 AM  CMP  Glucose 70 - 99 mg/dL 96  92  87   BUN 6 - 20 mg/dL 40  27  23   Creatinine 0.44 - 1.00 mg/dL 1.61  0.96  0.45   Sodium 135 - 145 mmol/L 139  138  139   Potassium 3.5 - 5.1 mmol/L 4.3  4.1  4.1   Chloride 98 - 111 mmol/L 103  103  107   CO2 22 - 32 mmol/L 27  25  25    Calcium 8.9 - 10.3 mg/dL 9.5  9.2  9.0       Microbiology: 12/28 BC NG  Toxo IgG > 400 Toxo PCR neg Toxo igM neg RPR NR CMV DNA neg Crypto neg HIV RNA 2 million>> 34, 000 Cd4 is 14 ( 2.4%) repeat on 1/29 is 416 ( 23%) Beta D glucan > 500 (  repeated) Histoplasma neg Fungal antibodies negative Genosure prime- no resistant mutations QuantiFERON gold indeterminate.  No treatment needed now.  Can repeat it later.     Assessment/Plan: Encephalopathy secondary to cocaine use as resolved  Aspiration leading to acute hypoxic respiratory failure with right middle lobe and lower lobe consolidation.  Status post intubation and was mechanically ventilated in the early part of the admission.  Now has been extubated for many days Took treatment with Zosyn completed treatment repeat chest x-ray is clear      Rt sided  flank, chest pain-intermittent  CT abd ok- MRI spine deg changes- no acute findings  Rash- resolved likely was due to bactrim- changed to Atovaquone- Mepron   H/o fall with recurrent ED presentation- had hurt left shoulder before     Pruritus with excoriations  and hyperpigmentiaon Possibly due to aids, ? Bactrim aggravated it Much better Scabies was in the D.D but as so much improved with just moisturizer ,it is unlikely    AIDS- non compliant with meds or visits to her Provider- not been in care since 2021- used to be followed at [facility] before Was on Biktarvy at one time and was non compliant then VL now is 2 million and cd4 is 11  Started Biktarvy on 12/14/23 . not sure how much compliance to be expected as her social situation is not conducive and so is her substance use- watch closely for Immune reconstitution inflammatory syndrome Genotype no resistance to NRTI, NNRTI, Integrase inhibitor and PI Repeat Vl shows a significant drop in 3 weeks from 2 million to 34,000 . Latest vl is 460 and CD4 is improved significantly from 11 to 413  RISK for IRIS- watch closely   Toxo IgG very high-  MRI brain no CNS lesions  . TOXO PCR negative On  PCP and MAI prophylaxis. Was On bactrim for PCP and Toxo prophylaxis- because of new rash concerning for bactrim allergy was changed to atovaquone Rash resolved  Beta D glucan high > 500 (fungal antibodies negative Just observe.    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

## 2024-01-23 NOTE — Progress Notes (Signed)
Progress Note   Patient: Meghan Jarvis ZOX:[id number] DOB: [birth year] DOA: 12/08/2023     46 DOS: the patient was seen and examined on 01/23/2024   Brief hospital course:  48 year old female past medical history of alcohol abuse, cocaine abuse, homelessness, AIDS, atrial fibrillation, hypothyroidism, type 2 diabetes mellitus presented to [facility] regional with acute respiratory failure.  She was found down by a friend and could not wake her up.  She was found to be hypoxic in the 70s.  Since she was obtunded she was intubated and admitted by the critical care service.  He was started on antibiotics for pneumonia.  Urine toxicology positive for cocaine.  Patient extubated on 12/30.   12/12/2023.  Patient feeling okay.  Spoke with patient's friend [name] on the phone.  The patient is dating [name]'s son. 1/2.  Patient complained of chest pain.  Cardiac enzymes negative x 2 and EKG ok. 1/3.  Patient complaining of abdominal pain.RUQ [place] negative.  MRI of the brain did not show any toxoplasmosis lesions but did show advanced cerebral atrophy. 1/4.  Patient complaining of headache.  Will give IV magnesium.  Patient walked 3 feet with physical therapy. 1/5.  Blood pressure this morning was low and 2 fluid boluses given.  Started on midodrine. 1/6.  Patient complaining of a lot of abdominal pain.  CT scan does not show any acute process and shows better aeration of right lower lung.  CT did show constipation.  Will start MiraLAX. 1/7.  Patient feeling good today.  Did not complain of any pain.  Hemoglobin 9.1, platelet count up at 311, white blood count 4.1   1/15.  We do not have a TOC plan at this point of time.  TOC reached out to APS.  Patient complains of right knee pain.  Uric acid 4.4.  X-ray negative. 1/16.  Patient feeling good and offers no complaints today. 1/17.  Patient complains of a slight headache. 1/18.  Patient complains of pain to nursing staff but not to me. 1/19.  Patient in for  Ace bandage for her right knee. 1/21.  I walked patient around the bed to the chair.  She used a walker.  Will give a trial of Flexeril since patient's pain in her right flank. 1/22: Little agitated due to difficult blood drawn. 1/23: Patient refusing to answer orientation questions and becoming agitated.  Looks like does not have capacity to make decision but not cooperative for any proper evaluation. 1/24: Giving some IV fluid and also started on low-dose metoprolol due to persistent mild tachycardia. 1/28: Psych reevaluated her, patient has capacity to make medical decision but need assistance with housing and finances due to cognitive decline.      Consultants:  Infectious disease      ASSESSMENT & PLAN:   Acute metabolic encephalopathy-improved Mental status improved from admission but patient is not the best historian and does not capacity to make decisions at this time.  DSS referral by TOC.  MRI did not show any enhancing lesions but did show severe cerebral atrophy.  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 Mental status improved Have been seen by psychiatry and found to have capacity to make her own decision  Continue to monitor mental status TOC working on placement   abdominal pain Patient intermittently complains of abdominal pain, no change in character.  Prior imaging is negative.  Patient having bowel movements.  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  Infectious disease following.  Placed on PCP and MAI prophylaxis with atovaquone and Zithromax.  Toxoplasma antibody IgG came back elevated and MRI of the brain does not show any enhancing lesions.  Infectious disease specialist started on Biktarvy.  Repeat viral load down to 34,300.  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  Platelet platelet count up in the normal range at 285, white blood cell count up in the normal range at 6.6, hemoglobin 8.1.   Protein-calorie malnutrition, severe Continue supplementation   Severe sepsis (HCC) Present on admission with tachycardia, tachypnea, fever, pneumonia, acute respiratory failure and acute metabolic encephalopathy.  Patient completed antibiotics.   Cocaine abuse (HCC) Patient has been counseled on cessation of cocaine use   Pneumonia of right lower lobe due to infectious organism Completed 5 days of antibiotic.   Acute hypoxic respiratory failure (HCC) Resolved.  Patient extubated on 12/30.  Patient currently breathing on room air.   Alcohol abuse Continue thiamine multivitamin and folic acid   Uncontrolled type 2 diabetes mellitus with hypoglycemia, without long-term current use of insulin (HCC) Hemoglobin A1c 6.0.    Generalized weakness Continue PT OT   Chest pain Atypical in nature.  Troponin x 2 negative.  EKG did not show any ST elevations.  Repeat EKG on 1/17 did not show any ST elevations.   AKI (acute kidney injury) (HCC) Continue monitoring Encouraged on adequate hydration   Right knee pain Patient asked for Ace bandage. She has not been wearing this. Intermittently complains of knee pain. Right knee x-ray negative.  Uric acid 4.4.  Continue to monitor.   Underweight (BMI < 18.5) Last BMI 18.54   Diarrhea-resolved continue as needed Colace for any constipation       Borderline underweight based on BMI: Body mass index is 19.51 kg/m.  Significantly low or high BMI is associated with higher medical risk.  Weight management advised as adjunct to other disease management and risk reduction treatments       DVT prophylaxis: lovenox  IV fluids: no continuous IV fluids  Nutrition: dysphagia Central lines / invasive devices: none   Code Status: FULL CODE  ACP documentation reviewed:  none on file in  [facility]   TOC needs: placement Barriers to dispo / significant pending items: placement   Family Communication: none   Subjective:  Patient seen and examined at bedside this morning Denies any acute overnight events or any current complaints TOC still continues to work on placement  Physical Exam:  Constitutional:      General: She is not in acute distress. Cardiovascular:     Rate and Rhythm: Regular rhythm.  Pulmonary:     Effort: Pulmonary effort is normal.  Neurological:     Mental Status: She is alert. Mental status is at baseline.  Psychiatric:        Mood and Affect: Mood normal.        Behavior: Behavior normal.     Vitals:   01/22/24 1642 01/22/24 2140 01/23/24 0508 01/23/24 0751  BP: 90/62 102/77 100/77 102/71  Pulse:  92 94 83  Resp: 18 17  19   Temp: 98.7 F (37.1 C) 97.9 F (36.6 C) 98.5 F (36.9 C) 97.6 F (36.4 C)  TempSrc:      SpO2: 100% 91% 99% 100%  Weight:  Height:        Data Reviewed:    Latest Ref Rng & Units 01/20/2024    5:08 AM 01/12/2024    5:11 AM 01/11/2024    4:21 AM  BMP  Glucose 70 - 99 mg/dL 96  92  87   BUN 6 - 20 mg/dL 40  27  23   Creatinine 0.44 - 1.00 mg/dL 2.95  6.21  3.08   Sodium 135 - 145 mmol/L 139  138  139   Potassium 3.5 - 5.1 mmol/L 4.3  4.1  4.1   Chloride 98 - 111 mmol/L 103  103  107   CO2 22 - 32 mmol/L 27  25  25    Calcium 8.9 - 10.3 mg/dL 9.5  9.2  9.0        Latest Ref Rng & Units 01/20/2024    5:08 AM 01/12/2024    5:11 AM 01/11/2024    4:21 AM  CBC  WBC 4.0 - 10.5 K/uL 6.4  7.4  5.8   Hemoglobin 12.0 - 15.0 g/dL 9.5  8.4  8.3   Hematocrit 36.0 - 46.0 % 30.6  27.0  26.7   Platelets 150 - 400 K/uL 279  263  257      Time spent: 35 minutes  Author: Loyce Dys, MD 01/23/2024 3:08 PM  For on call review [email or web address].

## 2024-01-23 NOTE — Plan of Care (Signed)
  Problem: Coping: Goal: Ability to adjust to condition or change in health will improve Outcome: Progressing   Problem: Fluid Volume: Goal: Ability to maintain a balanced intake and output will improve Outcome: Progressing   Problem: Health Behavior/Discharge Planning: Goal: Ability to identify and utilize available resources and services will improve Outcome: Progressing   Problem: Metabolic: Goal: Ability to maintain appropriate glucose levels will improve Outcome: Progressing   Problem: Nutritional: Goal: Maintenance of adequate nutrition will improve Outcome: Progressing   Problem: Skin Integrity: Goal: Risk for impaired skin integrity will decrease Outcome: Progressing   Problem: Tissue Perfusion: Goal: Adequacy of tissue perfusion will improve Outcome: Progressing   Problem: Health Behavior/Discharge Planning: Goal: Ability to manage health-related needs will improve Outcome: Progressing   Problem: Coping: Goal: Level of anxiety will decrease Outcome: Progressing   Problem: Elimination: Goal: Will not experience complications related to bowel motility Outcome: Progressing   Problem: Pain Management: Goal: General experience of comfort will improve Outcome: Progressing   Problem: Safety: Goal: Ability to remain free from injury will improve Outcome: Progressing   Problem: Activity: Goal: Ability to tolerate increased activity will improve Outcome: Progressing   Problem: Respiratory: Goal: Ability to maintain a clear airway and adequate ventilation will improve Outcome: Progressing

## 2024-01-23 NOTE — TOC Progression Note (Signed)
Transition of Care [facility]) - Progression Note    Patient Details  Name: LENNON RICHINS MRN: [id number] Date of Birth: 07-Feb-1976  Transition of Care [facility]) CM/SW Contact  Allena Katz, LCSW Phone Number: 01/23/2024, 3:09 PM  Clinical Narrative:   CSW attempted to call [facility] to connect with patient but unable to reach. Will try again later.         Expected Discharge Plan and Services         Expected Discharge Date: 01/10/24                                     Social Determinants of Health (SDOH) Interventions SDOH Screenings   Food Insecurity: Patient Unable To Answer (12/09/2023)  Recent Concern: Food Insecurity - Food Insecurity Present (09/26/2023)  Housing: Patient Unable To Answer (12/09/2023)  Recent Concern: Housing - Medium Risk (09/26/2023)  Transportation Needs: Patient Unable To Answer (12/09/2023)  Recent Concern: Transportation Needs - Unmet Transportation Needs (10/11/2023)  Utilities: Patient Unable To Answer (12/09/2023)  Recent Concern: Utilities - At Risk (09/25/2023)  Tobacco Use: High Risk (12/08/2023)    Readmission Risk Interventions     No data to display

## 2024-01-24 ENCOUNTER — Inpatient Hospital Stay: Payer: MEDICAID | Admitting: Infectious Diseases

## 2024-01-24 DIAGNOSIS — G9341 Metabolic encephalopathy: Secondary | ICD-10-CM | POA: Diagnosis not present

## 2024-01-24 NOTE — Progress Notes (Signed)
Progress Note   Patient: Meghan Jarvis ZOX:[id number] DOB: August 15, 1976 DOA: 12/08/2023     47 DOS: the patient was seen and examined on 01/24/2024    Brief hospital course:  48 year old female past medical history of alcohol abuse, cocaine abuse, homelessness, AIDS, atrial fibrillation, hypothyroidism, type 2 diabetes mellitus presented to [facility] with acute respiratory failure.  She was found down by a friend and could not wake her up.  She was found to be hypoxic in the 70s.  Since she was obtunded she was intubated and admitted by the critical care service.  He was started on antibiotics for pneumonia.  Urine toxicology positive for cocaine.  Patient extubated on 12/30.   12/12/2023.  Patient feeling okay.  Spoke with patient's friend [name] on the phone.  The patient is dating [name]'s son. 1/2.  Patient complained of chest pain.  Cardiac enzymes negative x 2 and EKG ok. 1/3.  Patient complaining of abdominal pain.RUQ [place] negative.  MRI of the brain did not show any toxoplasmosis lesions but did show advanced cerebral atrophy. 1/4.  Patient complaining of headache.  Will give IV magnesium.  Patient walked 3 feet with physical therapy. 1/5.  Blood pressure this morning was low and 2 fluid boluses given.  Started on midodrine. 1/6.  Patient complaining of a lot of abdominal pain.  CT scan does not show any acute process and shows better aeration of right lower lung.  CT did show constipation.  Will start MiraLAX. 1/7.  Patient feeling good today.  Did not complain of any pain.  Hemoglobin 9.1, platelet count up at 311, white blood count 4.1   1/15.  We do not have a TOC plan at this point of time.  TOC reached out to APS.  Patient complains of right knee pain.  Uric acid 4.4.  X-ray negative. 1/16.  Patient feeling good and offers no complaints today. 1/17.  Patient complains of a slight headache. 1/18.  Patient complains of pain to nursing staff but not to me. 1/19.  Patient in  for Ace bandage for her right knee. 1/21.  I walked patient around the bed to the chair.  She used a walker.  Will give a trial of Flexeril since patient's pain in her right flank. 1/22: Little agitated due to difficult blood drawn. 1/23: Patient refusing to answer orientation questions and becoming agitated.  Looks like does not have capacity to make decision but not cooperative for any proper evaluation. 1/24: Giving some IV fluid and also started on low-dose metoprolol due to persistent mild tachycardia. 1/28: Psych reevaluated her, patient has capacity to make medical decision but need assistance with housing and finances due to cognitive decline.       Consultants:  Infectious disease      ASSESSMENT & PLAN:   Acute metabolic encephalopathy-improved Mental status improved from admission but patient is not the best historian and does not capacity to make decisions at this time.  DSS referral by TOC.  MRI did not show any enhancing lesions but did show severe cerebral atrophy.  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 Mental status improved Have been seen by psychiatry and found to have capacity to make her own decision  Continue to monitor mental status TOC working on placement   abdominal pain Patient intermittently complains of abdominal pain, no change in character.  Prior imaging is negative.  Patient having bowel movements.  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  Infectious disease following.  Placed on PCP and MAI prophylaxis with atovaquone and Zithromax.  Toxoplasma antibody IgG came back elevated and MRI of the brain does not show any enhancing lesions.  Infectious disease specialist started on Biktarvy.  Repeat viral load down to 34,300.  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  Platelet platelet count up in the normal range at 285, white blood cell count up in the normal range at 6.6, hemoglobin 8.1.   Protein-calorie malnutrition, severe Continue supplementation   Severe sepsis (HCC) Present on admission with tachycardia, tachypnea, fever, pneumonia, acute respiratory failure and acute metabolic encephalopathy.  Patient completed antibiotics.   Cocaine abuse (HCC) Patient has been counseled on cessation of cocaine use   Pneumonia of right lower lobe due to infectious organism Completed 5 days of antibiotic.   Acute hypoxic respiratory failure (HCC) Resolved.  Patient extubated on 12/30.  Patient currently breathing on room air.   Alcohol abuse Continue thiamine multivitamin and folic acid   Uncontrolled type 2 diabetes mellitus with hypoglycemia, without long-term current use of insulin (HCC) Hemoglobin A1c 6.0.    Generalized weakness Continue PT OT   Chest pain Atypical in nature.  Troponin x 2 negative.  EKG did not show any ST elevations.  Repeat EKG on 1/17 did not show any ST elevations.   AKI (acute kidney injury) (HCC) Continue monitoring Encouraged on adequate hydration   Right knee pain Patient asked for Ace bandage. She has not been wearing this. Intermittently complains of knee pain. Right knee x-ray negative.  Uric acid 4.4.  Continue to monitor.   Underweight (BMI < 18.5) Last BMI 18.54   Diarrhea-resolved continue as needed Colace for any constipation       Borderline underweight based on BMI: Body mass index is 19.51 kg/m.  Significantly low or high BMI is associated with higher medical risk.  Weight management advised as adjunct to other disease management and risk reduction treatments   DVT prophylaxis: lovenox  IV fluids: no continuous IV fluids  Nutrition: dysphagia Central lines / invasive devices: none   Code Status: FULL CODE  ACP documentation reviewed:  none on file in [facility]    TOC needs: placement Barriers to dispo / significant pending items: placement    Family Communication: none     Subjective:  Patient seen and examined at bedside this morning Denies chest pain abdominal pain or cough   Physical Exam:   Constitutional:      General: She is not in acute distress. Cardiovascular:     Rate and Rhythm: Regular rhythm.  Pulmonary:     Effort: Pulmonary effort is normal.  Neurological:     Mental Status: She is alert. Mental status is at baseline.  Psychiatric:        Mood and Affect: Mood normal.        Behavior: Behavior normal.    Vitals:   01/24/24 0038 01/24/24 0504 01/24/24 0813 01/24/24 1724  BP:  102/77 92/70 102/65  Pulse:  90 98 97  Resp:  18 18 18   Temp:  98.6 F (37 C) 98 F (36.7 C) 98 F (36.7 C)  TempSrc:      SpO2:  100% 95% 100%  Weight: 62.4 kg     Height:        Data Reviewed:  Latest Ref Rng & Units 01/20/2024    5:08 AM 01/12/2024    5:11 AM 01/11/2024    4:21 AM  CBC  WBC 4.0 - 10.5 K/uL 6.4  7.4  5.8   Hemoglobin 12.0 - 15.0 g/dL 9.5  8.4  8.3   Hematocrit 36.0 - 46.0 % 30.6  27.0  26.7   Platelets 150 - 400 K/uL 279  263  257        Latest Ref Rng & Units 01/20/2024    5:08 AM 01/12/2024    5:11 AM 01/11/2024    4:21 AM  BMP  Glucose 70 - 99 mg/dL 96  92  87   BUN 6 - 20 mg/dL 40  27  23   Creatinine 0.44 - 1.00 mg/dL 1.32  4.40  1.02   Sodium 135 - 145 mmol/L 139  138  139   Potassium 3.5 - 5.1 mmol/L 4.3  4.1  4.1   Chloride 98 - 111 mmol/L 103  103  107   CO2 22 - 32 mmol/L 27  25  25    Calcium 8.9 - 10.3 mg/dL 9.5  9.2  9.0      Author: Loyce Dys, MD 01/24/2024 6:07 PM  For on call review [email or web address].

## 2024-01-24 NOTE — Progress Notes (Signed)
PT Cancellation Note  Patient Details Name: Meghan Jarvis MRN: [id number] DOB: [birth year]   Cancelled Treatment:     Pt witnessed several times ambulating out in hallway with and without staff. RW for safety, no LOB. Will continue to monitor acutely.    Jannet Askew 01/24/2024, 2:51 PM

## 2024-01-24 NOTE — Progress Notes (Signed)
Mobility Specialist - Progress Note   01/24/24 1000  Mobility  Activity Ambulated with assistance in hallway;Transferred from bed to chair  Level of Assistance Standby assist, set-up cues, supervision of patient - no hands on  Assistive Device Front wheel walker  Distance Ambulated (ft) 160 ft  Range of Motion/Exercises Active;Right leg;Left leg  Mobility visit 1 Mobility     Pt ambulating in room upon arrival, utilizing RA. Sat EOB to don socks using figure 4 positioning. Continued ambulation into hallway with minG. 1 mild LOB self-corrected. VC for maintaining hand placement onto RW for improved balance. Pt returned to recliner with alarm set, needs in reach.    Filiberto Pinks Mobility Specialist 01/24/24, 10:53 AM

## 2024-01-24 NOTE — Plan of Care (Signed)
  Problem: Coping: Goal: Ability to adjust to condition or change in health will improve Outcome: Progressing   Problem: Fluid Volume: Goal: Ability to maintain a balanced intake and output will improve Outcome: Progressing   Problem: Health Behavior/Discharge Planning: Goal: Ability to identify and utilize available resources and services will improve Outcome: Progressing   Problem: Metabolic: Goal: Ability to maintain appropriate glucose levels will improve Outcome: Progressing   Problem: Nutritional: Goal: Maintenance of adequate nutrition will improve Outcome: Progressing   Problem: Skin Integrity: Goal: Risk for impaired skin integrity will decrease Outcome: Progressing   Problem: Tissue Perfusion: Goal: Adequacy of tissue perfusion will improve Outcome: Progressing   Problem: Education: Goal: Knowledge of General Education information will improve Description: Including pain rating scale, medication(s)/side effects and non-pharmacologic comfort measures Outcome: Progressing   Problem: Activity: Goal: Risk for activity intolerance will decrease Outcome: Progressing   Problem: Nutrition: Goal: Adequate nutrition will be maintained Outcome: Progressing   Problem: Pain Management: Goal: General experience of comfort will improve Outcome: Progressing   Problem: Safety: Goal: Ability to remain free from injury will improve Outcome: Progressing   Problem: Skin Integrity: Goal: Risk for impaired skin integrity will decrease Outcome: Progressing   Problem: Activity: Goal: Ability to tolerate increased activity will improve Outcome: Progressing   Problem: Respiratory: Goal: Ability to maintain a clear airway and adequate ventilation will improve Outcome: Progressing

## 2024-01-25 ENCOUNTER — Other Ambulatory Visit: Payer: Self-pay

## 2024-01-25 DIAGNOSIS — F1490 Cocaine use, unspecified, uncomplicated: Secondary | ICD-10-CM | POA: Diagnosis not present

## 2024-01-25 DIAGNOSIS — B20 Human immunodeficiency virus [HIV] disease: Secondary | ICD-10-CM | POA: Diagnosis not present

## 2024-01-25 DIAGNOSIS — J9601 Acute respiratory failure with hypoxia: Secondary | ICD-10-CM | POA: Diagnosis not present

## 2024-01-25 DIAGNOSIS — G9341 Metabolic encephalopathy: Secondary | ICD-10-CM | POA: Diagnosis not present

## 2024-01-25 DIAGNOSIS — F1010 Alcohol abuse, uncomplicated: Secondary | ICD-10-CM | POA: Diagnosis not present

## 2024-01-25 MED ORDER — ATOVAQUONE 750 MG/5ML PO SUSP
1500.0000 mg | Freq: Every day | ORAL | 0 refills | Status: DC
  Filled 2024-01-25: qty 210, 21d supply, fill #0

## 2024-01-25 MED ORDER — FOLIC ACID 1 MG PO TABS
1.0000 mg | ORAL_TABLET | Freq: Every day | ORAL | 0 refills | Status: DC
  Filled 2024-01-25: qty 30, 30d supply, fill #0

## 2024-01-25 MED ORDER — DOCUSATE SODIUM 100 MG PO CAPS
100.0000 mg | ORAL_CAPSULE | Freq: Two times a day (BID) | ORAL | 0 refills | Status: DC
  Filled 2024-01-25: qty 10, 5d supply, fill #0

## 2024-01-25 MED ORDER — MIDODRINE HCL 10 MG PO TABS
10.0000 mg | ORAL_TABLET | Freq: Three times a day (TID) | ORAL | 0 refills | Status: DC
  Filled 2024-01-25: qty 90, 30d supply, fill #0

## 2024-01-25 MED ORDER — AZITHROMYCIN 600 MG PO TABS
1200.0000 mg | ORAL_TABLET | ORAL | 3 refills | Status: DC
  Filled 2024-01-25: qty 4, 14d supply, fill #0

## 2024-01-25 MED ORDER — BICTEGRAVIR-EMTRICITAB-TENOFOV 50-200-25 MG PO TABS
1.0000 | ORAL_TABLET | Freq: Every day | ORAL | 0 refills | Status: DC
  Filled 2024-01-25: qty 30, 30d supply, fill #0

## 2024-01-25 MED ORDER — PANTOPRAZOLE SODIUM 40 MG PO TBEC
40.0000 mg | DELAYED_RELEASE_TABLET | Freq: Two times a day (BID) | ORAL | 0 refills | Status: DC
  Filled 2024-01-25: qty 60, 30d supply, fill #0

## 2024-01-25 MED ORDER — VITAMIN B-1 100 MG PO TABS
100.0000 mg | ORAL_TABLET | Freq: Every day | ORAL | 0 refills | Status: DC
  Filled 2024-01-25: qty 30, 30d supply, fill #0

## 2024-01-25 NOTE — Progress Notes (Signed)
OT Cancellation Note  Patient Details Name: Meghan Jarvis MRN: [id number] DOB: Feb 16, 1976   Cancelled Treatment:    Reason Eval/Treat Not Completed: Other (comment). Chart reviewed. On arrival pt walking independently out of bathroom having completed toileting without assistance. Per chart pt IND in room and hallway without AD. All goals met, no further skilled acute OT needs identified, will sign off.   Kathie Dike, M.S. OTR/L  01/25/24, 2:04 PM  ascom [phone]

## 2024-01-25 NOTE — TOC Progression Note (Signed)
Transition of Care [facility]) - Progression Note    Patient Details  Name: Meghan Jarvis MRN: [id number] Date of Birth: 1976-02-27  Transition of Care [facility]) CM/SW Contact  Allena Katz, LCSW Phone Number: 01/25/2024, 1:30 PM  Clinical Narrative:   CSW spoke with [facility] social worker who states pt is too disoriented to do an assessment.         Expected Discharge Plan and Services         Expected Discharge Date: 01/10/24                                     Social Determinants of Health (SDOH) Interventions SDOH Screenings   Food Insecurity: Patient Unable To Answer (12/09/2023)  Recent Concern: Food Insecurity - Food Insecurity Present (09/26/2023)  Housing: Patient Unable To Answer (12/09/2023)  Recent Concern: Housing - Medium Risk (09/26/2023)  Transportation Needs: Patient Unable To Answer (12/09/2023)  Recent Concern: Transportation Needs - Unmet Transportation Needs (10/11/2023)  Utilities: Patient Unable To Answer (12/09/2023)  Recent Concern: Utilities - At Risk (09/25/2023)  Tobacco Use: High Risk (12/08/2023)    Readmission Risk Interventions     No data to display

## 2024-01-25 NOTE — Progress Notes (Signed)
Date of Admission:  12/08/2023     ID: Meghan Jarvis is a 48 y.o. female  Principal Problem:   Acute metabolic encephalopathy Active Problems:   Alcohol abuse   Acute hypoxic respiratory failure (HCC)   Pneumonia of right lower lobe due to infectious organism   Protein-calorie malnutrition, severe   Uncontrolled type 2 diabetes mellitus with hypoglycemia, without long-term current use of insulin (HCC)   Cocaine abuse (HCC)   AIDS (HCC)   Severe sepsis (HCC)   Chest pain   Pancytopenia (HCC)   Generalized weakness   Diarrhea   Right upper quadrant abdominal pain   Headache   Underweight (BMI < 18.5)   Hypotension   Abdominal pain   Encounter for psychiatric assessment   Rash due to allergy   Right knee pain   AKI (acute kidney injury) (HCC)  ? Meghan Jarvis is a 48 y.o. with a history of AIDS, not compliant with meds or visits ,  , cocaine use was brought in by EMS after being found unresponsive on the couch  in an aquaintance place-   Subjective: She is doing fine  No complaints Medications:   atovaquone  1,500 mg Oral Q breakfast   azithromycin  1,200 mg Oral Weekly   bictegravir-emtricitabine-tenofovir AF  1 tablet Oral Daily   docusate sodium  100 mg Oral BID   DULoxetine  30 mg Oral QHS   enoxaparin (LOVENOX) injection  40 mg Subcutaneous QHS   feeding supplement  237 mL Oral QID   folic acid  1 mg Oral Daily   hydrocerin   Topical BID   lidocaine  1 patch Transdermal Q24H   midodrine  10 mg Oral TID WC   multivitamin with minerals  1 tablet Oral QHS   pantoprazole  40 mg Oral BID   thiamine  100 mg Oral Daily    Objective: Vital signs in last 24 hours: Patient Vitals for the past 24 hrs:  BP Temp Temp src Pulse Resp SpO2 Weight  01/25/24 1946 (!) 90/48 97.8 F (36.6 C) Oral 81 18 92 % --  01/25/24 1646 113/83 97.8 F (36.6 C) Oral 100 20 100 % --  01/25/24 0801 102/77 97.8 F (36.6 C) -- 95 16 100 % --  01/25/24 0500 -- -- -- -- -- -- 68.1  kg  01/25/24 0436 111/83 98 F (36.7 C) -- 97 18 100 % --  Awake and alert Respond to questions appropriately [id number] Lungs clear to auscultation Abdomen soft Skin- hyperpigmentation, dryness, scaling has improved significantly. CNS grossly non focal   Lab Results    Latest Ref Rng & Units 01/20/2024    5:08 AM 01/12/2024    5:11 AM 01/11/2024    4:21 AM  CBC  WBC 4.0 - 10.5 K/uL 6.4  7.4  5.8   Hemoglobin 12.0 - 15.0 g/dL 9.5  8.4  8.3   Hematocrit 36.0 - 46.0 % 30.6  27.0  26.7   Platelets 150 - 400 K/uL 279  263  257        Latest Ref Rng & Units 01/20/2024    5:08 AM 01/12/2024    5:11 AM 01/11/2024    4:21 AM  CMP  Glucose 70 - 99 mg/dL 96  92  87   BUN 6 - 20 mg/dL 40  27  23   Creatinine 0.44 - 1.00 mg/dL 1.61  0.96  0.45   Sodium 135 - 145 mmol/L 139  138  139   Potassium 3.5 - 5.1 mmol/L 4.3  4.1  4.1   Chloride 98 - 111 mmol/L 103  103  107   CO2 22 - 32 mmol/L 27  25  25    Calcium 8.9 - 10.3 mg/dL 9.5  9.2  9.0       Microbiology: 12/28 BC NG  Toxo IgG > 400 Toxo PCR neg Toxo igM neg RPR NR CMV DNA neg Crypto neg HIV RNA 2 million>> 34, 000 Cd4 is 14 ( 2.4%) repeat on 1/29 is 416 ( 23%) Beta D glucan > 500 (  repeated) Histoplasma neg Fungal antibodies negative Genosure prime- no resistant mutations QuantiFERON gold indeterminate.  No treatment needed now.  Can repeat it later.     Assessment/Plan: Encephalopathy secondary to cocaine use as resolved  Aspiration leading to acute hypoxic respiratory failure with right middle lobe and lower lobe consolidation.  Status post intubation and was mechanically ventilated in the early part of the admission.  Now has been extubated for many days Took treatment with Zosyn completed treatment repeat chest x-ray is clear      Rt sided  flank, chest pain-intermittent  CT abd ok- MRI spine deg changes- no acute findings  Rash- resolved likely was due to bactrim- changed to Atovaquone- Mepron   H/o fall with  recurrent ED presentation- had hurt left shoulder before     Pruritus with excoriations and hyperpigmentiaon Possibly due to aids, ? Bactrim aggravated it Much better Scabies was in the D.D but as so much improved with just moisturizer ,it is unlikely    AIDS- non compliant with meds or visits to her Provider- not been in care since 2021- used to be followed at [facility] before Was on Biktarvy at one time and was non compliant then VL now is 2 million and cd4 is 11  Started Biktarvy on 12/14/23 . not sure how much compliance to be expected as her social situation is not conducive and so is her substance use- watch closely for Immune reconstitution inflammatory syndrome Genotype no resistance to NRTI, NNRTI, Integrase inhibitor and PI Repeat Vl shows a significant drop in 3 weeks from 2 million to 34,000 . Latest vl is 460 and CD4 is improved significantly from 11 to 413  RISK for IRIS- watch closely   Toxo IgG very high-  MRI brain no CNS lesions  . TOXO PCR negative On  PCP and MAI prophylaxis. Was On bactrim for PCP and Toxo prophylaxis- because of new rash concerning for bactrim allergy was changed to atovaquone Rash resolved  Beta D glucan high > 500 (fungal antibodies negative Just observe.    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

## 2024-01-25 NOTE — Progress Notes (Signed)
Mobility Specialist - Progress Note   01/25/24 1000  Mobility  Activity Ambulated with assistance in hallway  Level of Assistance Standby assist, set-up cues, supervision of patient - no hands on  Assistive Device Front wheel walker  Distance Ambulated (ft) 120 ft  Activity Response Tolerated well  Mobility visit 1 Mobility     Pt standing in doorway upon arrival, utilizing RA. Pt ambulated in hallway with supervision + RW. No LOB. VC to maintain hand placement onto RW. Pt returned to EOB with needs in reach.    Filiberto Pinks Mobility Specialist 01/25/24, 10:31 AM

## 2024-01-25 NOTE — TOC Progression Note (Signed)
Transition of Care [facility]) - Progression Note    Patient Details  Name: ELEASE SWARM MRN: [id number] Date of Birth: 01-Feb-1976  [facility]) CM/SW Contact  Allena Katz, LCSW Phone Number: 01/25/2024, 3:27 PM  Clinical Narrative:   Marchia Meiers is at bedside stating he would like to take patient to his home. Number for him is [phone].          Expected Discharge Plan and Services         Expected Discharge Date: 01/10/24                                     Social Determinants of Health (SDOH) Interventions SDOH Screenings   Food Insecurity: Patient Unable To Answer (12/09/2023)  Recent Concern: Food Insecurity - Food Insecurity Present (09/26/2023)  Housing: Patient Unable To Answer (12/09/2023)  Recent Concern: Housing - Medium Risk (09/26/2023)  Transportation Needs: Patient Unable To Answer (12/09/2023)  Recent Concern: Transportation Needs - Unmet Transportation Needs (10/11/2023)  Utilities: Patient Unable To Answer (12/09/2023)  Recent Concern: Utilities - At Risk (09/25/2023)  Tobacco Use: High Risk (12/08/2023)    Readmission Risk Interventions     No data to display

## 2024-01-25 NOTE — Progress Notes (Signed)
Progress Note   Patient: Meghan Jarvis AVW:[id number] DOB: 01-Jul-1976 DOA: 12/08/2023     48 DOS: the patient was seen and examined on 01/25/2024     Brief hospital course:  48 year old female past medical history of alcohol abuse, cocaine abuse, homelessness, AIDS, atrial fibrillation, hypothyroidism, type 2 diabetes mellitus presented to [facility] with acute respiratory failure.  She was found down by a friend and could not wake her up.  She was found to be hypoxic in the 70s.  Since she was obtunded she was intubated and admitted by the critical care service.  He was started on antibiotics for pneumonia.  Urine toxicology positive for cocaine.  Patient extubated on 12/30.   12/12/2023.  Patient feeling okay.  Spoke with patient's friend [name] on the phone.  The patient is dating [name]'s son. 1/2.  Patient complained of chest pain.  Cardiac enzymes negative x 2 and EKG ok. 1/3.  Patient complaining of abdominal pain.RUQ [place] negative.  MRI of the brain did not show any toxoplasmosis lesions but did show advanced cerebral atrophy. 1/4.  Patient complaining of headache.  Will give IV magnesium.  Patient walked 3 feet with physical therapy. 1/5.  Blood pressure this morning was low and 2 fluid boluses given.  Started on midodrine. 1/6.  Patient complaining of a lot of abdominal pain.  CT scan does not show any acute process and shows better aeration of right lower lung.  CT did show constipation.  Will start MiraLAX. 1/7.  Patient feeling good today.  Did not complain of any pain.  Hemoglobin 9.1, platelet count up at 311, white blood count 4.1   1/15.  We do not have a TOC plan at this point of time.  TOC reached out to APS.  Patient complains of right knee pain.  Uric acid 4.4.  X-ray negative. 1/16.  Patient feeling good and offers no complaints today. 1/17.  Patient complains of a slight headache. 1/18.  Patient complains of pain to nursing staff but not to me. 1/19.  Patient in  for Ace bandage for her right knee. 1/21.  I walked patient around the bed to the chair.  She used a walker.  Will give a trial of Flexeril since patient's pain in her right flank. 1/22: Little agitated due to difficult blood drawn. 1/23: Patient refusing to answer orientation questions and becoming agitated.  Looks like does not have capacity to make decision but not cooperative for any proper evaluation. 1/24: Giving some IV fluid and also started on low-dose metoprolol due to persistent mild tachycardia. 1/28: Psych reevaluated her, patient has capacity to make medical decision but need assistance with housing and finances due to cognitive decline.       Consultants:  Infectious disease      ASSESSMENT & PLAN:   Acute metabolic encephalopathy-improved Mental status improved from admission but patient is not the best historian and does not have capacity to make decisions at this time.  DSS referral by TOC.  MRI did not show any enhancing lesions but did show severe cerebral atrophy.  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 Mental status improved Patient was initially seen by psychiatry weeks ago and was found to have capacity. Given her cognitive decline concerns were raised by myself as well as infectious disease and medical decision making capacity was re-evaluated on 01/25/2024 by me in the presence of patient's RN Amandeep as well as DSS rep Anyia and patient was found not to have capacity at this  time DSS involved and TOC following up on next step   abdominal pain Patient intermittently complains of abdominal pain, no change in character.  Prior imaging is negative.  Patient having bowel movements.  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  Infectious disease following.  Placed on PCP and MAI prophylaxis with atovaquone and  Zithromax.  Toxoplasma antibody IgG came back elevated and MRI of the brain does not show any enhancing lesions.  Infectious disease specialist started on Biktarvy.  Repeat viral load down to 34,300.  ID following patient and case discussed   Headache-improved   Pancytopenia (HCC)-improved Secondary to HIV.  Platelet platelet count up in the normal range at 285, white blood cell count up in the normal range at 6.6, hemoglobin 8.1.   Protein-calorie malnutrition, severe Continue supplementation   Severe sepsis (HCC) Present on admission with tachycardia, tachypnea, fever, pneumonia, acute respiratory failure and acute metabolic encephalopathy.  Patient completed antibiotics.   Cocaine abuse (HCC) Patient has been counseled on cessation of cocaine use   Pneumonia of right lower lobe due to infectious organism Completed 5 days of antibiotic.   Acute hypoxic respiratory failure (HCC) Resolved.  Patient extubated on 12/30.  Patient currently breathing on room air.   Alcohol abuse Continue thiamine multivitamin and folic acid   Uncontrolled type 2 diabetes mellitus with hypoglycemia, without long-term current use of insulin (HCC) Hemoglobin A1c 6.0.    Generalized weakness Continue PT OT   Chest pain Atypical in nature.  Troponin x 2 negative.  EKG did not show any ST elevations.  Repeat EKG on 1/17 did not show any ST elevations.   AKI (acute kidney injury) (HCC) Continue monitoring Encouraged on adequate hydration   Right knee pain Patient asked for Ace bandage. She has not been wearing this. Intermittently complains of knee pain. Right knee x-ray negative.  Uric acid 4.4.  Continue to monitor.   Underweight (BMI < 18.5) Last BMI 18.54   Diarrhea-resolved continue as needed Colace for any constipation       Borderline underweight based on BMI: Body mass index is 19.51 kg/m.  Significantly low or high BMI is associated with higher medical risk.  Weight management  advised as adjunct to other disease management and risk reduction treatments   DVT prophylaxis: lovenox   Central lines / invasive devices: none   Code Status: FULL CODE     TOC needs: placement Barriers to dispo / significant pending items: placement    Family Communication: none     Subjective:  Patient seen and examined at bedside this morning She denies nausea vomiting abdominal pain chest pain or cough   Physical Exam:   Constitutional:      General: She is not in acute distress. Cardiovascular:     Rate and Rhythm: Regular rhythm.  Pulmonary:     Effort: Pulmonary effort is normal.  Neurological:     Mental Status: She is alert. Mental status is at baseline.  Psychiatric:        Mood and Affect: Mood normal.        Behavior: Behavior normal.       Latest Ref Rng & Units 01/20/2024    5:08 AM 01/12/2024    5:11 AM 01/11/2024    4:21 AM  CBC  WBC 4.0 - 10.5 K/uL 6.4  7.4  5.8   Hemoglobin 12.0 - 15.0 g/dL 9.5  8.4  8.3   Hematocrit 36.0 - 46.0 % 30.6  27.0  26.7   Platelets 150 - 400 K/uL 279  263  257        Latest Ref Rng & Units 01/20/2024    5:08 AM 01/12/2024    5:11 AM 01/11/2024    4:21 AM  BMP  Glucose 70 - 99 mg/dL 96  92  87   BUN 6 - 20 mg/dL 40  27  23   Creatinine 0.44 - 1.00 mg/dL 7.42  5.95  6.38   Sodium 135 - 145 mmol/L 139  138  139   Potassium 3.5 - 5.1 mmol/L 4.3  4.1  4.1   Chloride 98 - 111 mmol/L 103  103  107   CO2 22 - 32 mmol/L 27  25  25    Calcium 8.9 - 10.3 mg/dL 9.5  9.2  9.0      Vitals:   01/25/24 0436 01/25/24 0500 01/25/24 0801 01/25/24 1646  BP: 111/83  102/77 113/83  Pulse: 97  95 100  Resp: 18  16 20   Temp: 98 F (36.7 C)  97.8 F (36.6 C) 97.8 F (36.6 C)  TempSrc:    Oral  SpO2: 100%  100% 100%  Weight:  68.1 kg    Height:         Author: Loyce Dys, MD 01/25/2024 4:58 PM  For on call review [email or web address].

## 2024-01-25 NOTE — Progress Notes (Signed)
PT Cancellation Note  Patient Details Name: Meghan Jarvis MRN: [id number] DOB: [birth year]   Cancelled Treatment:    Reason Eval/Treat Not Completed: Other (comment). PT sitting at nursing desk when this pt ambulated to her door modI with her RW. Able to speak with therapist and ask for the bathroom, directed to her bathroom in her room. Independently ambulated into bathroom for independent toileting. PT to sign off due to pt's progress and current mobility status, please re-consult if pt experiences any acute changes in mobility.    Olga Coaster PT, DPT 2:06 PM,01/25/24

## 2024-01-26 DIAGNOSIS — G9341 Metabolic encephalopathy: Secondary | ICD-10-CM | POA: Diagnosis not present

## 2024-01-26 MED ORDER — ONDANSETRON 4 MG PO TBDP
4.0000 mg | ORAL_TABLET | Freq: Four times a day (QID) | ORAL | Status: DC | PRN
  Administered 2024-01-26 – 2024-04-27 (×7): 4 mg via ORAL
  Filled 2024-01-26 (×8): qty 1

## 2024-01-26 NOTE — Progress Notes (Signed)
Progress Note   Patient: Meghan Jarvis FIE:[id number] DOB: [birth year] DOA: 12/08/2023     49 DOS: the patient was seen and examined on 01/26/2024   Brief hospital course:  48 year old female past medical history of alcohol abuse, cocaine abuse, homelessness, AIDS, atrial fibrillation, hypothyroidism, type 2 diabetes mellitus presented to [facility] with acute respiratory failure.  She was found down by a friend and could not wake her up.  She was found to be hypoxic in the 70s.  Since she was obtunded she was intubated and admitted by the critical care service.  He was started on antibiotics for pneumonia.  Urine toxicology positive for cocaine.  Patient extubated on 12/30.   12/12/2023.  Patient feeling okay.  Spoke with patient's friend [name] on the phone.  The patient is dating [name]'s son. 1/2.  Patient complained of chest pain.  Cardiac enzymes negative x 2 and EKG ok. 1/3.  Patient complaining of abdominal pain.RUQ [place] negative.  MRI of the brain did not show any toxoplasmosis lesions but did show advanced cerebral atrophy. 1/4.  Patient complaining of headache.  Will give IV magnesium.  Patient walked 3 feet with physical therapy. 1/5.  Blood pressure this morning was low and 2 fluid boluses given.  Started on midodrine. 1/6.  Patient complaining of a lot of abdominal pain.  CT scan does not show any acute process and shows better aeration of right lower lung.  CT did show constipation.  Will start MiraLAX. 1/7.  Patient feeling good today.  Did not complain of any pain.  Hemoglobin 9.1, platelet count up at 311, white blood count 4.1   1/15.  We do not have a TOC plan at this point of time.  TOC reached out to APS.  Patient complains of right knee pain.  Uric acid 4.4.  X-ray negative. 1/16.  Patient feeling good and offers no complaints today. 1/17.  Patient complains of a slight headache. 1/18.  Patient complains of pain to nursing staff but not to me. 1/19.  Patient in for  Ace bandage for her right knee. 1/21.  I walked patient around the bed to the chair.  She used a walker.  Will give a trial of Flexeril since patient's pain in her right flank. 1/22: Little agitated due to difficult blood drawn. 1/23: Patient refusing to answer orientation questions and becoming agitated.  Looks like does not have capacity to make decision but not cooperative for any proper evaluation. 1/24: Giving some IV fluid and also started on low-dose metoprolol due to persistent mild tachycardia. 1/28: Psych reevaluated her, patient has capacity to make medical decision but need assistance with housing and finances due to cognitive decline.       Consultants:  Infectious disease      ASSESSMENT & PLAN:   Acute metabolic encephalopathy-improved Mental status improved from admission but patient is not the best historian and does not have capacity to make decisions at this time.  DSS referral by TOC.  MRI did not show any enhancing lesions but did show severe cerebral atrophy.  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 Mental status improved Patient was initially seen by psychiatry weeks ago and was found to have capacity. Given her cognitive decline concerns were raised by myself as well as infectious disease and medical decision making capacity was re-evaluated on 01/25/2024 by me in the presence of patient's RN Amandeep as well as DSS rep Anyia and patient was found not to have capacity at this time Case  management still following up on APS and DsS   abdominal pain Patient intermittently complains of abdominal pain, no change in character.  Prior imaging is negative.  Patient having bowel movements.  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  Infectious disease following.  Placed on PCP and MAI prophylaxis with atovaquone and  Zithromax.  Toxoplasma antibody IgG came back elevated and MRI of the brain does not show any enhancing lesions.  Infectious disease specialist started on Biktarvy.  Repeat viral load down to 34,300.  ID following patient and case discussed   Headache-improved   Pancytopenia (HCC)-improved Secondary to HIV.  Platelet platelet count up in the normal range at 285, white blood cell count up in the normal range at 6.6, hemoglobin 8.1.   Protein-calorie malnutrition, severe Continue supplementation   Severe sepsis (HCC) Present on admission with tachycardia, tachypnea, fever, pneumonia, acute respiratory failure and acute metabolic encephalopathy.  Patient completed antibiotics.   Cocaine abuse (HCC) Patient has been counseled on cessation of cocaine use   Pneumonia of right lower lobe due to infectious organism Completed 5 days of antibiotic.   Acute hypoxic respiratory failure (HCC) Resolved.  Patient extubated on 12/30.  Patient currently breathing on room air.   Alcohol abuse Continue thiamine multivitamin and folic acid   Uncontrolled type 2 diabetes mellitus with hypoglycemia, without long-term current use of insulin (HCC) Hemoglobin A1c 6.0.    Generalized weakness Continue PT OT   Chest pain Atypical in nature.  Troponin x 2 negative.  EKG did not show any ST elevations.  Repeat EKG on 1/17 did not show any ST elevations.   AKI (acute kidney injury) (HCC) Continue monitoring Encouraged on adequate hydration   Right knee pain Patient asked for Ace bandage. She has not been wearing this. Intermittently complains of knee pain. Right knee x-ray negative.  Uric acid 4.4.  Continue to monitor.   Underweight (BMI < 18.5) Last BMI 18.54   Diarrhea-resolved continue as needed Colace for any constipation       Borderline underweight based on BMI: Body mass index is 19.51 kg/m.  Significantly low or high BMI is associated with higher medical risk.  Weight management  advised as adjunct to other disease management and risk reduction treatments   DVT prophylaxis: lovenox    Central lines / invasive devices: none   Code Status: FULL CODE      TOC needs: placement Barriers to dispo / significant pending items: placement    Family Communication: none     Subjective:  Denies nausea vomiting abdominal pain or chest pain Case management working on placement APS and DSS involved   Physical Exam:   Constitutional:      General: She is not in acute distress. Cardiovascular:     Rate and Rhythm: Regular rhythm.  Pulmonary:     Effort: Pulmonary effort is normal.  Neurological:     Mental Status: She is alert. Mental status is at baseline.  Psychiatric:        Mood and Affect: Mood normal.        Behavior: Behavior normal.      Vitals:   01/26/24 0500 01/26/24 0608 01/26/24 0825 01/26/24 0827  BP:  96/68 95/63 90/61   Pulse:  95 95 94  Resp:  20 18 18   Temp:  (!) 97.4 F (36.3 C)  98.6 F (37 C) 98.6 F (37 C)  TempSrc:   Oral   SpO2:  100% 100% 100%  Weight: 65.6 kg     Height:          Latest Ref Rng & Units 01/20/2024    5:08 AM 01/12/2024    5:11 AM 01/11/2024    4:21 AM  BMP  Glucose 70 - 99 mg/dL 96  92  87   BUN 6 - 20 mg/dL 40  27  23   Creatinine 0.44 - 1.00 mg/dL 2.13  0.86  5.78   Sodium 135 - 145 mmol/L 139  138  139   Potassium 3.5 - 5.1 mmol/L 4.3  4.1  4.1   Chloride 98 - 111 mmol/L 103  103  107   CO2 22 - 32 mmol/L 27  25  25    Calcium 8.9 - 10.3 mg/dL 9.5  9.2  9.0        Latest Ref Rng & Units 01/20/2024    5:08 AM 01/12/2024    5:11 AM 01/11/2024    4:21 AM  CBC  WBC 4.0 - 10.5 K/uL 6.4  7.4  5.8   Hemoglobin 12.0 - 15.0 g/dL 9.5  8.4  8.3   Hematocrit 36.0 - 46.0 % 30.6  27.0  26.7   Platelets 150 - 400 K/uL 279  263  257      Author: Loyce Dys, MD 01/26/2024 4:38 PM  For on call review [email or web address].

## 2024-01-26 NOTE — Plan of Care (Signed)
   Problem: Coping: Goal: Ability to adjust to condition or change in health will improve Outcome: Progressing   Problem: Fluid Volume: Goal: Ability to maintain a balanced intake and output will improve Outcome: Progressing   Problem: Health Behavior/Discharge Planning: Goal: Ability to identify and utilize available resources and services will improve Outcome: Progressing

## 2024-01-27 DIAGNOSIS — G9341 Metabolic encephalopathy: Secondary | ICD-10-CM | POA: Diagnosis not present

## 2024-01-27 MED ORDER — TRAMADOL HCL 50 MG PO TABS
50.0000 mg | ORAL_TABLET | Freq: Once | ORAL | Status: AC
  Administered 2024-01-28: 50 mg via ORAL
  Filled 2024-01-27: qty 1

## 2024-01-27 NOTE — Progress Notes (Signed)
Progress Note   Patient: Meghan Jarvis WGN:[id number] DOB: 1976-11-21 DOA: 12/08/2023     50 DOS: the patient was seen and examined on 01/27/2024    Brief hospital course:  48 year old female past medical history of alcohol abuse, cocaine abuse, homelessness, AIDS, atrial fibrillation, hypothyroidism, type 2 diabetes mellitus presented to [facility] with acute respiratory failure.  She was found down by a friend and could not wake her up.  She was found to be hypoxic in the 70s.  Since she was obtunded she was intubated and admitted by the critical care service.  He was started on antibiotics for pneumonia.  Urine toxicology positive for cocaine.  Patient extubated on 12/30.   12/12/2023.  Patient feeling okay.  Spoke with patient's friend [name] on the phone.  The patient is dating [name]'s son. 1/2.  Patient complained of chest pain.  Cardiac enzymes negative x 2 and EKG ok. 1/3.  Patient complaining of abdominal pain.RUQ [place] negative.  MRI of the brain did not show any toxoplasmosis lesions but did show advanced cerebral atrophy. 1/4.  Patient complaining of headache.  Will give IV magnesium.  Patient walked 3 feet with physical therapy. 1/5.  Blood pressure this morning was low and 2 fluid boluses given.  Started on midodrine. 1/6.  Patient complaining of a lot of abdominal pain.  CT scan does not show any acute process and shows better aeration of right lower lung.  CT did show constipation.  Will start MiraLAX. 1/7.  Patient feeling good today.  Did not complain of any pain.  Hemoglobin 9.1, platelet count up at 311, white blood count 4.1   1/15.  We do not have a TOC plan at this point of time.  TOC reached out to APS.  Patient complains of right knee pain.  Uric acid 4.4.  X-ray negative. 1/16.  Patient feeling good and offers no complaints today. 1/17.  Patient complains of a slight headache. 1/18.  Patient complains of pain to nursing staff but not to me. 1/19.  Patient in  for Ace bandage for her right knee. 1/21.  I walked patient around the bed to the chair.  She used a walker.  Will give a trial of Flexeril since patient's pain in her right flank. 1/22: Little agitated due to difficult blood drawn. 1/23: Patient refusing to answer orientation questions and becoming agitated.  Looks like does not have capacity to make decision but not cooperative for any proper evaluation. 1/24: Giving some IV fluid and also started on low-dose metoprolol due to persistent mild tachycardia. 1/28: Psych reevaluated her, patient has capacity to make medical decision but need assistance with housing and finances due to cognitive decline.       Consultants:  Infectious disease      ASSESSMENT & PLAN:   Acute metabolic encephalopathy-improved Mental status improved from admission but patient is not the best historian and does not have capacity to make decisions at this time.  DSS referral by TOC.  MRI did not show any enhancing lesions but did show severe cerebral atrophy.  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 Mental status improved Patient was initially seen by psychiatry weeks ago and was found to have capacity. 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  Infectious disease following.  Placed on PCP and MAI prophylaxis with atovaquone and Zithromax.  Toxoplasma antibody IgG came back elevated and MRI of the brain does not show any enhancing lesions.  Infectious disease specialist started on Biktarvy.  Repeat  viral load down to 34,300.  ID following patient and case discussed   Headache-improved   Pancytopenia (HCC)-improved Secondary to HIV.  Platelet platelet count up in the normal range at 285, white blood cell count up in the normal range at 6.6, hemoglobin 8.1.   Protein-calorie malnutrition, severe Continue supplementation   Severe sepsis (HCC) Present on admission with tachycardia, tachypnea, fever, pneumonia, acute respiratory failure and acute metabolic encephalopathy.  Patient completed antibiotics.   Cocaine abuse (HCC) Patient has been counseled on cessation of cocaine use   Pneumonia of right lower lobe due to infectious organism Completed 5 days of antibiotic.   Acute hypoxic respiratory failure (HCC) Resolved.  Patient extubated on 12/30.  Patient currently breathing on room air.   Alcohol abuse Continue thiamine multivitamin and folic acid   Uncontrolled type 2 diabetes mellitus with hypoglycemia, without long-term current use of insulin (HCC) Hemoglobin A1c 6.0.    Generalized weakness Continue PT OT   Chest pain Atypical in nature.  Troponin x 2 negative.  EKG did not show any ST elevations.  Repeat EKG on 1/17 did not show any ST elevations.   AKI (acute kidney injury) (HCC) Continue monitoring Encouraged on adequate hydration   Right knee pain Patient asked for Ace bandage. She has not been wearing this. Intermittently complains of knee pain. Right knee x-ray negative.  Uric acid 4.4.  Continue to monitor.   Underweight (BMI < 18.5) Last BMI 18.54   Diarrhea-resolved continue as needed Colace for any constipation       Borderline underweight based on BMI: Body mass index is 19.51 kg/m.  Significantly low or high BMI is associated with higher medical risk.  Weight management advised as adjunct to other disease management and risk reduction treatments   DVT prophylaxis: lovenox    Central lines / invasive devices: none   Code Status: FULL CODE       TOC needs: placement Barriers to dispo / significant pending items: placement    Family Communication: none     Subjective:  Denies nausea vomiting abdominal pain or chest pain Case management working on placement APS and DSS involved   Physical Exam:   Constitutional:      General: She is not in acute distress. Cardiovascular:     Rate and Rhythm: Regular rhythm.  Pulmonary:     Effort: Pulmonary effort is normal.  Neurological:     Mental Status: She is alert. Mental status is at baseline.  Psychiatric:        Mood and Affect: Mood normal.        Behavior: Behavior normal.     Vitals:   01/27/24 0500 01/27/24 0757 01/27/24 1025 01/27/24 1200  BP: 136/76 100/60 122/84 90/60  Pulse: 95  89   Resp: 18  18   Temp: 98.1 F (36.7 C)  98.5 F (36.9 C)   TempSrc:      SpO2: 99%  99%   Weight:      Height:  Latest Ref Rng & Units 01/20/2024    5:08 AM 01/12/2024    5:11 AM 01/11/2024    4:21 AM  CBC  WBC 4.0 - 10.5 K/uL 6.4  7.4  5.8   Hemoglobin 12.0 - 15.0 g/dL 9.5  8.4  8.3   Hematocrit 36.0 - 46.0 % 30.6  27.0  26.7   Platelets 150 - 400 K/uL 279  263  257        Latest Ref Rng & Units 01/20/2024    5:08 AM 01/12/2024    5:11 AM 01/11/2024    4:21 AM  BMP  Glucose 70 - 99 mg/dL 96  92  87   BUN 6 - 20 mg/dL 40  27  23   Creatinine 0.44 - 1.00 mg/dL 1.61  0.96  0.45   Sodium 135 - 145 mmol/L 139  138  139   Potassium 3.5 - 5.1 mmol/L 4.3  4.1  4.1   Chloride 98 - 111 mmol/L 103  103  107   CO2 22 - 32 mmol/L 27  25  25    Calcium 8.9 - 10.3 mg/dL 9.5  9.2  9.0      Author: Loyce Dys, MD 01/27/2024 3:46 PM  For on call review [email or web address].

## 2024-01-27 NOTE — Plan of Care (Signed)
  Problem: Coping: Goal: Ability to adjust to condition or change in health will improve Outcome: Progressing   Problem: Fluid Volume: Goal: Ability to maintain a balanced intake and output will improve Outcome: Progressing   Problem: Health Behavior/Discharge Planning: Goal: Ability to identify and utilize available resources and services will improve Outcome: Progressing Goal: Ability to manage health-related needs will improve Outcome: Progressing   Problem: Metabolic: Goal: Ability to maintain appropriate glucose levels will improve Outcome: Progressing   Problem: Skin Integrity: Goal: Risk for impaired skin integrity will decrease Outcome: Progressing   Problem: Tissue Perfusion: Goal: Adequacy of tissue perfusion will improve Outcome: Progressing   Problem: Education: Goal: Knowledge of General Education information will improve Description: Including pain rating scale, medication(s)/side effects and non-pharmacologic comfort measures Outcome: Progressing

## 2024-01-27 NOTE — Plan of Care (Signed)

## 2024-01-28 DIAGNOSIS — G9341 Metabolic encephalopathy: Secondary | ICD-10-CM | POA: Diagnosis not present

## 2024-01-28 NOTE — Progress Notes (Signed)
   01/28/24 1445  Spiritual Encounters  Type of Visit Initial  Care provided to: Patient  Conversation partners present during encounter Other (comment) Training and development officer)  Referral source Other (comment) Training and development officer)  Reason for visit Routine spiritual support  OnCall Visit No   Chaplain visited patient per a referral from the staff.  Chaplain offered prayer, reflective listening, a compassionate presence and spiritual resources.  Rev. Rana M. Earlene Plater, MDiv Chaplain Resident  [facility]

## 2024-01-28 NOTE — Progress Notes (Signed)
Progress Note   Patient: Meghan Jarvis DOB: [birth year] DOA: 12/08/2023     50 DOS: the patient was seen and examined on 01/28/2024      Brief hospital course:  48 year old female past medical history of alcohol abuse, cocaine abuse, homelessness, AIDS, atrial fibrillation, hypothyroidism, type 2 diabetes mellitus presented to [facility] with acute respiratory failure.  She was found down by a friend and could not wake her up.  She was found to be hypoxic in the 70s.  Since she was obtunded she was intubated and admitted by the critical care service.  He was started on antibiotics for pneumonia.  Urine toxicology positive for cocaine.  Patient extubated on 12/30.   12/12/2023.  Patient feeling okay.  Spoke with patient's friend [name] on the phone.  The patient is dating [name]'s son. 1/2.  Patient complained of chest pain.  Cardiac enzymes negative x 2 and EKG ok. 1/3.  Patient complaining of abdominal pain.RUQ [place] negative.  MRI of the brain did not show any toxoplasmosis lesions but did show advanced cerebral atrophy. 1/4.  Patient complaining of headache.  Will give IV magnesium.  Patient walked 3 feet with physical therapy. 1/5.  Blood pressure this morning was low and 2 fluid boluses given.  Started on midodrine. 1/6.  Patient complaining of a lot of abdominal pain.  CT scan does not show any acute process and shows better aeration of right lower lung.  CT did show constipation.  Will start MiraLAX. 1/7.  Patient feeling good today.  Did not complain of any pain.  Hemoglobin 9.1, platelet count up at 311, white blood count 4.1   1/15.  We do not have a TOC plan at this point of time.  TOC reached out to APS.  Patient complains of right knee pain.  Uric acid 4.4.  X-ray negative. 1/16.  Patient feeling good and offers no complaints today. 1/17.  Patient complains of a slight headache. 1/18.  Patient complains of pain to nursing staff but not to me. 1/19.  Patient  in for Ace bandage for her right knee. 1/21.  I walked patient around the bed to the chair.  She used a walker.  Will give a trial of Flexeril since patient's pain in her right flank. 1/22: Little agitated due to difficult blood drawn. 1/23: Patient refusing to answer orientation questions and becoming agitated.  Looks like does not have capacity to make decision but not cooperative for any proper evaluation. 1/24: Giving some IV fluid and also started on low-dose metoprolol due to persistent mild tachycardia. 1/28: Psych reevaluated her, patient has capacity to make medical decision but need assistance with housing and finances due to cognitive decline.       Consultants:  Infectious disease      ASSESSMENT & PLAN:   Acute metabolic encephalopathy-improved Mental status improved from admission but patient is not the best historian and does not have capacity to make decisions at this time.  DSS referral by TOC.  MRI did not show any enhancing lesions but did show severe cerebral atrophy.  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 Mental status improved Patient was initially seen by psychiatry on 01/08/2024 and was found to have capacity. Given her cognitive decline concerns were raised by myself as well as infectious disease and medical decision making capacity was re-evaluated on 01/25/2024 by me in the presence of patient's RN Amandeep as well as DSS rep Anyia and patient was found not to have capacity at  this time Case management still following up on APS and DsS According to case management DSS have requested for another formal evaluation by psychiatry concerning capacity. 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  Infectious disease following.  Placed on PCP and MAI prophylaxis with atovaquone  and Zithromax.  Toxoplasma antibody IgG came back elevated and MRI of the brain does not show any enhancing lesions.  Infectious disease specialist started on Biktarvy.  Repeat viral load down to 34,300.  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  Platelet platelet count up in the normal range at 285, white blood cell count up in the normal range at 6.6, hemoglobin 8.1.   Protein-calorie malnutrition, severe Continue supplementation   Severe sepsis (HCC) Present on admission with tachycardia, tachypnea, fever, pneumonia, acute respiratory failure and acute metabolic encephalopathy.  Patient completed antibiotics.   Cocaine abuse (HCC) Patient has been counseled on cessation of cocaine use   Pneumonia of right lower lobe due to infectious organism Completed 5 days of antibiotic.   Acute hypoxic respiratory failure (HCC) Resolved.  Patient extubated on 12/30.  Patient currently breathing on room air.   Alcohol abuse Continue thiamine multivitamin and folic acid   Uncontrolled type 2 diabetes mellitus with hypoglycemia, without long-term current use of insulin (HCC) Hemoglobin A1c 6.0.    Generalized weakness Continue PT OT  Chest pain Atypical in nature.  Troponin x 2 negative.  EKG did not show any ST elevations.  Repeat EKG on 1/17 did not show any ST elevations.   AKI (acute kidney injury) (HCC) Continue monitoring Encouraged on adequate hydration   Right knee pain Patient asked for Ace bandage. She has not been wearing this. Intermittently complains of knee pain. Right knee x-ray negative.  Uric acid 4.4.  Continue to monitor.   Underweight (BMI < 18.5) Last BMI 18.54   Diarrhea-resolved continue as needed Colace for any constipation       Borderline underweight based on BMI: Body mass index is 19.51 kg/m.  Significantly low or high BMI is associated with higher medical risk.  Weight management advised as adjunct to  other disease management and risk reduction treatments   DVT prophylaxis: lovenox    Central lines / invasive devices: none   Code Status: FULL CODE      TOC needs: placement Barriers to dispo / significant pending items: placement    Family Communication: none     Subjective:  Patient seen and examined at bedside this morning Denies nausea vomiting abdominal pain   Physical Exam:   Constitutional:      General: She is not in acute distress. Cardiovascular:     Rate and Rhythm: Regular rhythm.  Pulmonary:     Effort: Pulmonary effort is normal.  Neurological:     Mental Status: She is alert. Mental status is at baseline.  Psychiatric:        Mood and Affect: Mood normal.        Behavior: Behavior normal.       Data reviewed:     Latest Ref Rng & Units 01/20/2024    5:08 AM 01/12/2024    5:11 AM 01/11/2024    4:21 AM  BMP  Glucose 70 - 99 mg/dL 96  92  87   BUN 6 - 20  mg/dL 40  27  23   Creatinine 0.44 - 1.00 mg/dL 1.61  0.96  0.45   Sodium 135 - 145 mmol/L 139  138  139   Potassium 3.5 - 5.1 mmol/L 4.3  4.1  4.1   Chloride 98 - 111 mmol/L 103  103  107   CO2 22 - 32 mmol/L 27  25  25    Calcium 8.9 - 10.3 mg/dL 9.5  9.2  9.0     Vitals:   01/27/24 2058 01/27/24 2151 01/28/24 0533 01/28/24 0855  BP: (!) 72/55 100/65 99/62 106/72  Pulse: 87 87 89 98  Resp: 18  18 18   Temp: 98 F (36.7 C)  98 F (36.7 C) 98.4 F (36.9 C)  TempSrc:      SpO2: 100%  100% 100%  Weight:      Height:          Latest Ref Rng & Units 01/20/2024    5:08 AM 01/12/2024    5:11 AM 01/11/2024    4:21 AM  CBC  WBC 4.0 - 10.5 K/uL 6.4  7.4  5.8   Hemoglobin 12.0 - 15.0 g/dL 9.5  8.4  8.3   Hematocrit 36.0 - 46.0 % 30.6  27.0  26.7   Platelets 150 - 400 K/uL 279  263  257      Author: Loyce Dys, MD 01/28/2024 4:48 PM  For on call review [email or web address].

## 2024-01-28 NOTE — Consult Note (Signed)
 [facility] Psychiatric Consult Initial  Patient Name: .AVE Jarvis  MRN: [id number]  DOB: 21-Aug-1976  Consult Order details:  Orders (From admission, onward)     Start     Ordered   12/24/23 0739  IP CONSULT TO PSYCHIATRY       Ordering Provider: Debarah Crape, DO  Provider:  (Not yet assigned)  Question Answer Comment  Location [facility]   Reason for Consult? capacity assessment      12/24/23 0738             Mode of Visit: In person, I spent 45 min on this consult.     Psychiatry Consult Evaluation  Service Date: January 28, 2024 LOS:  LOS: 51 days  Chief Complaint "I don't know what I am going to do after I leave the hospital".   Primary Psychiatric Diagnoses  Encounter psychiatric assessment  Assessment  Meghan Jarvis is a 47 y.o. female admitted: Medicallyfor 12/08/2023  9:57 PM for abdominal pain. She carries the psychiatric diagnoses of substance use and has a past medical history of HIV, acute metabolic encephalopathy, pyelonephritis, hypertension, migraine, atrial fibrillation.   Her current presentation of encounter for psychiatric assessment is requested by the attending to assess capacity. Please see plan below for detailed recommendations.   Diagnoses:  Active Hospital problems: Principal Problem:   Acute metabolic encephalopathy Active Problems:   Alcohol abuse   Acute hypoxic respiratory failure (HCC)   Pneumonia of right lower lobe due to infectious organism   Protein-calorie malnutrition, severe   Uncontrolled type 2 diabetes mellitus with hypoglycemia, without long-term current use of insulin (HCC)   Cocaine abuse (HCC)   AIDS (HCC)   Severe sepsis (HCC)   Chest pain   Pancytopenia (HCC)   Generalized weakness   Diarrhea   Right upper quadrant abdominal pain   Headache   Underweight (BMI < 18.5)   Hypotension   Abdominal pain   Encounter for psychiatric assessment   Rash due to allergy   Right knee  pain   AKI (acute kidney injury) (HCC)    Plan   ## Psychiatric Medication Recommendations:  -No new medications  ## Medical Decision Making Capacity:  Patient has no communication barriers with language or understanding.  Patient appears to be unable to recall the medical problems that she is presenting with and states that she is unaware and believes that the doctors are incorrect and the diagnoses for example her HIV in which she states that the medical doctors got all wrong.  This is a big change from the last assessment in which I had on December 24, 2023, she does not reflect appreciation in her care due not understanding because nor the treatment plan for current medical problems.when asked about expressing a choice in her medical care it seems that she becomes easily frustrated and is has poor attention in which she gets easily distracted when asked about her decisions and her care for example being asked what she would do if she ran out of medications she was unable to answer to questions in which she became distracted stating that she will find a place to live and I will provide all of her meds.  She seems to be only aware of her current situation and is able to have a conversation with the provider but unable to logically think deeper in her medical needs.  Patient is lacking and reasoning in her medical care for example she is unable to describe  the consequence that she will have if she does not take her medications or has complications with her chronic conditions.  The [facility] cognitive assessment was conducted with the score 13 out of 30.  ## Further Work-up:  -- No further workup recommended   ## Disposition:-- There are no psychiatric contraindications to discharge at this time.   ## Behavioral / Environmental: - No specific recommendations at this time.    Thank you for this consult request. Recommendations have been communicated to the primary team.  We will clear the patient  from psych, with Capacity screening completed at this time.   Juliann Pares, NP       History of Present Illness  Relevant Aspects of [facility] Course:  Admitted on 12/08/2023 for abdominal pain. They are currently in bed sitting up awake and alert actively participating in interview.   Patient Report:  48 year old female requested for capacity screening by the attending, patient is in the room with her fianc and calm and pleasant mood.  Patient was able to recognize me and was talkative with note of inattentiveness while asking questions.  Patient was explained that the provider was providing a capacity screening and to answer any questions to the best of her ability, in which she was getting very frustrated with all questions and has a marked difference from the last assessment on December 24, 2023.  When asked about her understanding of her medical problems that she reported that she does not have any medical problems and the doctors are just keeping her there and that there are no problems.  Patient was asked about this denial in which given another chance to try to understand the reasoning of why she had these medical conditions in which she was unable to explain.  She laughed and then became very distracted often deflecting to her fianc and will in which he reminded her that he cannot participate in interview.  When asked to express choice and a response to a problem with her medical for example I was asked since she is homeless what she would do if she ran on medications.  She states "I do not know just run out of medications or someone just needs to give it to me".  Next, we asked the patient if she understands what brought her into the hospital and why she is to stay here for so long in which she stated she was not sure and unable to understand why she was here for so long, and when asked how she thinks she got to this point of getting better for example I asked how she felt about  her rash on her bottom "getting better she states she does not know it just got better by itself".  Based on this big difference from December 24, 2023, it is by my recommendation that the patient does not meet capacity at this time as of 01/28/2024.  To further support this capacity screening a [place] cognitive assessment was completed in which the patient received a score of 13 out of 30.  Psych ROS:  Depression: Positive Anxiety:  negatives Mania (lifetime and current): denies Psychosis: (lifetime and current): denies   Review of Systems  Constitutional:  Positive for malaise/fatigue.  HENT: Negative.    Eyes: Negative.   Respiratory: Negative.    Cardiovascular: Negative.   Gastrointestinal:  Positive for abdominal pain.  Genitourinary: Negative.   Musculoskeletal:  Positive for myalgias.  Skin:  Positive for itching.  Neurological: Negative.  Endo/Heme/Allergies: Negative.   Psychiatric/Behavioral:  Positive for depression.      Psychiatric and Social History  Psychiatric History:  Information collected from Pt  Prev Dx/Sx: Substance use Current Psych Provider: Denies Home Meds (current): See Attending notes Family Psych History: Denies Family Hx suicide: Denies  Social History:   Living Situation: Homeless Spiritual Hx: Denies Access to weapons/lethal means: Denies   Substance History Alcohol: Endorses, 3 or more drinks a day  Tobacco: Denies Illicit drugs: Cocaine   Exam Findings   Vital Signs:  Temp:  [98 F (36.7 C)-98.4 F (36.9 C)] 98.4 F (36.9 C) (02/17 0855) Pulse Rate:  [87-98] 98 (02/17 0855) Resp:  [18] 18 (02/17 0855) BP: (72-106)/(55-72) 106/72 (02/17 0855) SpO2:  [100 %] 100 % (02/17 0855) Blood pressure 106/72, pulse 98, temperature 98.4 F (36.9 C), resp. rate 18, height 5' 7.99" (1.727 m), weight 65.6 kg, SpO2 100%. Body mass index is 21.99 kg/m.  Physical Exam HENT:     Head: Normocephalic.     Mouth/Throat:     Mouth: Mucous  membranes are moist.  Cardiovascular:     Rate and Rhythm: Normal rate.  Pulmonary:     Effort: Pulmonary effort is normal.  Musculoskeletal:     Cervical back: Normal range of motion.  Skin:    General: Skin is warm and dry.  Neurological:     Mental Status: She is alert.     Mental Status Exam: General Appearance: Casual and Disheveled  Orientation:  Full (Time, Place, and Person)  Memory:  Immediate;   Good Recent;   Good Remote;   Good  Concentration:  Concentration: Good and Attention Span: Good  Recall:  Good  Attention  Fair  Eye Contact:  Good  Speech:  Clear and Coherent  Language:  Good  Volume:  Normal  Mood: Depressed  Affect:  Depressed  Thought Process:  Coherent  Thought Content:  Logical  Suicidal Thoughts:  No  Homicidal Thoughts:  No  Judgement:  Good  Insight:  Good  Psychomotor Activity:  Normal  Akathisia:  No  Fund of Knowledge:  Good      Assets:  Others:  None identifies.   Cognition:  WNL  ADL's:  Intact  AIMS (if indicated):        Other History   These have been pulled in through the EMR, reviewed, and updated if appropriate.  Family History:  The patient's Family history is unknown by patient.  Medical History: Past Medical History:  Diagnosis Date   Asthma    Depression    HIV (human immunodeficiency virus infection) (HCC)     Surgical History: Past Surgical History:  Procedure Laterality Date   ABDOMINAL HYSTERECTOMY     APPENDECTOMY       Medications:   Current Facility-Administered Medications:    acetaminophen (TYLENOL) tablet 650 mg, 650 mg, Oral, Q6H PRN, Renae Gloss, Richard, MD, 650 mg at 01/28/24 0958   atovaquone (MEPRON) 750 MG/5ML suspension 1,500 mg, 1,500 mg, Oral, Q breakfast, Ravishankar, Jayashree, MD, 1,500 mg at 01/28/24 0959   azithromycin (ZITHROMAX) tablet 1,200 mg, 1,200 mg, Oral, Weekly, Aleda Grana, RPH, 1,200 mg at 01/23/24 1358   bictegravir-emtricitabine-tenofovir AF (BIKTARVY)  50-200-25 MG per tablet 1 tablet, 1 tablet, Oral, Daily, Ravishankar, Jayashree, MD, 1 tablet at 01/28/24 0959   cyclobenzaprine (FLEXERIL) tablet 5 mg, 5 mg, Oral, TID PRN, Alford Highland, MD, 5 mg at 01/28/24 1440   dicyclomine (BENTYL) tablet 20 mg, 20 mg, Oral,  TID PRN, Sunnie Nielsen, DO, 20 mg at 01/27/24 1543   diphenhydrAMINE (BENADRYL) capsule 25 mg, 25 mg, Oral, Q8H PRN, Dezii, Alexandra, DO, 25 mg at 01/26/24 2044   docusate sodium (COLACE) capsule 100 mg, 100 mg, Oral, BID, Renae Gloss, Richard, MD, 100 mg at 01/27/24 2222   DULoxetine (CYMBALTA) DR capsule 30 mg, 30 mg, Oral, QHS, Dezii, Alexandra, DO, 30 mg at 01/27/24 2222   enoxaparin (LOVENOX) injection 40 mg, 40 mg, Subcutaneous, QHS, Aleskerov, Fuad, MD, 40 mg at 01/27/24 2222   feeding supplement (ENSURE ENLIVE / ENSURE PLUS) liquid 237 mL, 237 mL, Oral, QID, Djan, Scarlette Calico, MD, 237 mL at 01/28/24 1437   folic acid (FOLVITE) tablet 1 mg, 1 mg, Oral, Daily, Vida Rigger, MD, 1 mg at 01/28/24 4098   hydrocerin (EUCERIN) cream, , Topical, BID, Alford Highland, MD, Given at 01/28/24 1000   hydrocortisone (ANUSOL-HC) 2.5 % rectal cream, , Rectal, QID PRN, Sunnie Nielsen, DO   ipratropium-albuterol (DUONEB) 0.5-2.5 (3) MG/3ML nebulizer solution 3 mL, 3 mL, Nebulization, Q6H PRN, Renae Gloss, Richard, MD   lactulose (CHRONULAC) 10 GM/15ML solution 20 g, 20 g, Oral, Daily PRN, Arnetha Courser, MD   lidocaine (LIDODERM) 5 % 1 patch, 1 patch, Transdermal, Q24H, Wieting, Richard, MD, 1 patch at 01/27/24 1735   midodrine (PROAMATINE) tablet 10 mg, 10 mg, Oral, TID WC, Loyce Dys, MD, 10 mg at 01/28/24 1235   multivitamin with minerals tablet 1 tablet, 1 tablet, Oral, QHS, Aleda Grana, RPH, 1 tablet at 01/27/24 2222   nicotine polacrilex (NICORETTE) gum 2 mg, 2 mg, Oral, PRN, Manuela Schwartz, NP   ondansetron (ZOFRAN-ODT) disintegrating tablet 4 mg, 4 mg, Oral, Q6H PRN, Rosezetta Schlatter T, MD, 4 mg at 01/26/24 1406   oxyCODONE  (Oxy IR/ROXICODONE) immediate release tablet 5 mg, 5 mg, Oral, TID PRN, Sunnie Nielsen, DO, 5 mg at 01/27/24 1207   pantoprazole (PROTONIX) EC tablet 40 mg, 40 mg, Oral, BID, Wieting, Richard, MD, 40 mg at 01/28/24 0958   polyethylene glycol (MIRALAX / GLYCOLAX) packet 17 g, 17 g, Oral, Daily PRN, Renae Gloss, Richard, MD, 17 g at 01/28/24 1191   thiamine (VITAMIN B1) tablet 100 mg, 100 mg, Oral, Daily, Karna Christmas, Fuad, MD, 100 mg at 01/28/24 0958   traMADol (ULTRAM) tablet 50 mg, 50 mg, Oral, Once, Djan, Scarlette Calico, MD  Allergies: Allergies  Allergen Reactions   Fish Allergy Other (See Comments)    Crab legs result in itching  Crab legs result in itching  Crab legs result in itching   Shellfish Allergy Other (See Comments)    Crab legs result in itching   Buprenorphine Hcl     Pt states she not allergic   Morphine And Codeine Itching    hives   Sulfa Antibiotics Rash    Juliann Pares, NP

## 2024-01-29 DIAGNOSIS — B20 Human immunodeficiency virus [HIV] disease: Secondary | ICD-10-CM | POA: Diagnosis not present

## 2024-01-29 DIAGNOSIS — F1010 Alcohol abuse, uncomplicated: Secondary | ICD-10-CM | POA: Diagnosis not present

## 2024-01-29 DIAGNOSIS — J9601 Acute respiratory failure with hypoxia: Secondary | ICD-10-CM | POA: Diagnosis not present

## 2024-01-29 DIAGNOSIS — F1490 Cocaine use, unspecified, uncomplicated: Secondary | ICD-10-CM | POA: Diagnosis not present

## 2024-01-29 DIAGNOSIS — G9341 Metabolic encephalopathy: Secondary | ICD-10-CM | POA: Diagnosis not present

## 2024-01-29 LAB — BASIC METABOLIC PANEL
Anion gap: 9 (ref 5–15)
BUN: 28 mg/dL — ABNORMAL HIGH (ref 6–20)
CO2: 25 mmol/L (ref 22–32)
Calcium: 9.8 mg/dL (ref 8.9–10.3)
Chloride: 104 mmol/L (ref 98–111)
Creatinine, Ser: 0.79 mg/dL (ref 0.44–1.00)
GFR, Estimated: >60 mL/min (ref >60)
Glucose, Bld: 80 mg/dL (ref 70–99)
Potassium: 4.2 mmol/L (ref 3.5–5.1)
Sodium: 138 mmol/L (ref 135–145)

## 2024-01-29 LAB — CBC WITH DIFFERENTIAL/PLATELET
Abs Immature Granulocytes: 0.01 10*3/uL (ref 0.00–0.07)
Basophils Absolute: 0.1 10*3/uL (ref 0.0–0.1)
Basophils Relative: 1 %
Eosinophils Absolute: 0.5 10*3/uL (ref 0.0–0.5)
Eosinophils Relative: 10 %
HCT: 28.6 % — ABNORMAL LOW (ref 36.0–46.0)
Hemoglobin: 9.1 g/dL — ABNORMAL LOW (ref 12.0–15.0)
Immature Granulocytes: 0 %
Lymphocytes Relative: 50 %
Lymphs Abs: 2.6 10*3/uL (ref 0.7–4.0)
MCH: 27.4 pg (ref 26.0–34.0)
MCHC: 31.8 g/dL (ref 30.0–36.0)
MCV: 86.1 fL (ref 80.0–100.0)
Monocytes Absolute: 0.7 10*3/uL (ref 0.1–1.0)
Monocytes Relative: 13 %
Neutro Abs: 1.3 10*3/uL — ABNORMAL LOW (ref 1.7–7.7)
Neutrophils Relative %: 26 %
Platelets: 263 10*3/uL (ref 150–400)
RBC: 3.32 MIL/uL — ABNORMAL LOW (ref 3.87–5.11)
RDW: 15.9 % — ABNORMAL HIGH (ref 11.5–15.5)
WBC: 5.2 10*3/uL (ref 4.0–10.5)
nRBC: 0 % (ref 0.0–0.2)

## 2024-01-29 NOTE — Progress Notes (Signed)
Progress Note   Patient: Meghan Jarvis XBM:[id number] DOB: May 25, 1976 DOA: 12/08/2023     52 DOS: the patient was seen and examined on 01/29/2024    Brief hospital course:  48 year old female past medical history of alcohol abuse, cocaine abuse, homelessness, AIDS, atrial fibrillation, hypothyroidism, type 2 diabetes mellitus presented to [facility] with acute respiratory failure.  She was found down by a friend and could not wake her up.  She was found to be hypoxic in the 70s.  Since she was obtunded she was intubated and admitted by the critical care service.  He was started on antibiotics for pneumonia.  Urine toxicology positive for cocaine.  Patient extubated on 12/30.   12/12/2023.  Patient feeling okay.  Spoke with patient's friend [name] on the phone.  The patient is dating [name]'s son. 1/2.  Patient complained of chest pain.  Cardiac enzymes negative x 2 and EKG ok. 1/3.  Patient complaining of abdominal pain.RUQ [place] negative.  MRI of the brain did not show any toxoplasmosis lesions but did show advanced cerebral atrophy. 1/4.  Patient complaining of headache.  Will give IV magnesium.  Patient walked 3 feet with physical therapy. 1/5.  Blood pressure this morning was low and 2 fluid boluses given.  Started on midodrine. 1/6.  Patient complaining of a lot of abdominal pain.  CT scan does not show any acute process and shows better aeration of right lower lung.  CT did show constipation.  Will start MiraLAX. 1/7.  Patient feeling good today.  Did not complain of any pain.  Hemoglobin 9.1, platelet count up at 311, white blood count 4.1   1/15.  We do not have a TOC plan at this point of time.  TOC reached out to APS.  Patient complains of right knee pain.  Uric acid 4.4.  X-ray negative. 1/16.  Patient feeling good and offers no complaints today. 1/17.  Patient complains of a slight headache. 1/18.  Patient complains of pain to nursing staff but not to me. 1/19.  Patient in  for Ace bandage for her right knee. 1/21.  I walked patient around the bed to the chair.  She used a walker.  Will give a trial of Flexeril since patient's pain in her right flank. 1/22: Little agitated due to difficult blood drawn. 1/23: Patient refusing to answer orientation questions and becoming agitated.  Looks like does not have capacity to make decision but not cooperative for any proper evaluation. 1/24: Giving some IV fluid and also started on low-dose metoprolol due to persistent mild tachycardia. 1/28: Psych reevaluated her, patient has capacity to make medical decision but need assistance with housing and finances due to cognitive decline.       Consultants:  Infectious disease      ASSESSMENT & PLAN:   Acute metabolic encephalopathy-improved Mental status improved from admission but patient is not the best historian and does not have capacity to make decisions at this time.  DSS referral by TOC.  MRI did not show any enhancing lesions but did show severe cerebral atrophy.  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 Mental status improved Patient was initially seen by psychiatry on 01/08/2024 and was found to have capacity. Given her cognitive decline concerns were raised by myself as well as infectious disease and medical decision making capacity was re-evaluated on 01/25/2024 by me in the presence of patient's RN Amandeep as well as DSS rep Anyia and patient was found not to have capacity at this time  Case management still following up on APS and DsS According to case management DSS have requested for another formal evaluation by psychiatry concerning capacity. I have discussed with psychiatry and reevaluation pending   Abdominal pain-resolved Continue to monitor closely   Hypotension-improved Continue midodrine   HIV w/ progression to AIDS  Viral load 2 million, CD4 14 on admission.  Infectious disease following.  Placed on PCP and MAI prophylaxis with atovaquone and  Zithromax.  Toxoplasma antibody IgG came back elevated and MRI of the brain does not show any enhancing lesions.  Infectious disease specialist started on Biktarvy.  Repeat viral load down to 34,300.  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  Platelet platelet count up in the normal range at 285, white blood cell count up in the normal range at 6.6, hemoglobin 8.1.   Protein-calorie malnutrition, severe Continue supplementation   Severe sepsis (HCC) Present on admission with tachycardia, tachypnea, fever, pneumonia, acute respiratory failure and acute metabolic encephalopathy.  Patient completed antibiotics.   Cocaine abuse (HCC) Patient has been counseled on cessation of cocaine use   Pneumonia of right lower lobe due to infectious organism Completed 5 days of antibiotic.   Acute hypoxic respiratory failure (HCC) Resolved.  Patient extubated on 12/30.  Patient currently breathing on room air.   Alcohol abuse Continue thiamine multivitamin and folic acid   Uncontrolled type 2 diabetes mellitus with hypoglycemia, without long-term current use of insulin (HCC) Hemoglobin A1c 6.0.    Generalized weakness Continue PT OT   Chest pain Atypical in nature.  Troponin x 2 negative.  EKG did not show any ST elevations.  Repeat EKG on 1/17 did not show any ST elevations.   AKI (acute kidney injury) (HCC) Continue monitoring Encouraged on adequate hydration   Right knee pain Patient asked for Ace bandage. She has not been wearing this. Intermittently complains of knee pain. Right knee x-ray negative.  Uric acid 4.4.  Continue to monitor.   Underweight (BMI < 18.5) Last BMI 18.54   Diarrhea-resolved continue as needed Colace for any constipation       Borderline underweight based on BMI: Body mass index is 19.51 kg/m.  Significantly low or high BMI is associated with higher medical risk.  Weight management advised as adjunct to  other disease management and risk reduction treatments   DVT prophylaxis: lovenox    Central lines / invasive devices: none   Code Status: FULL CODE      TOC needs: placement Barriers to dispo / significant pending items: APS/DSS case pending guardianship as patient has no medical decision making capacity   Family Communication: none     Subjective:  Patient seen and examined at bedside this morning Patient denies any complaints today Was reevaluated by psychiatry yesterday and found not to have capacity   Physical Exam:   Constitutional:      General: She is not in acute distress. Cardiovascular:     Rate and Rhythm: Regular rhythm.  Pulmonary:     Effort: Pulmonary effort is normal.  Neurological:     Mental Status: She is alert. Mental status is at baseline.  Psychiatric:        Mood and Affect: Mood normal.        Behavior: Behavior normal.       Data reviewed:      Latest Ref Rng & Units 01/29/2024    5:36 AM 01/20/2024    5:08 AM 01/12/2024  5:11 AM  CBC  WBC 4.0 - 10.5 K/uL 5.2  6.4  7.4   Hemoglobin 12.0 - 15.0 g/dL 9.1  9.5  8.4   Hematocrit 36.0 - 46.0 % 28.6  30.6  27.0   Platelets 150 - 400 K/uL 263  279  263        Latest Ref Rng & Units 01/29/2024    5:36 AM 01/20/2024    5:08 AM 01/12/2024    5:11 AM  BMP  Glucose 70 - 99 mg/dL 80  96  92   BUN 6 - 20 mg/dL 28  40  27   Creatinine 0.44 - 1.00 mg/dL 4.69  6.29  5.28   Sodium 135 - 145 mmol/L 138  139  138   Potassium 3.5 - 5.1 mmol/L 4.2  4.3  4.1   Chloride 98 - 111 mmol/L 104  103  103   CO2 22 - 32 mmol/L 25  27  25    Calcium 8.9 - 10.3 mg/dL 9.8  9.5  9.2     Vitals:   01/28/24 2052 01/29/24 0451 01/29/24 0457 01/29/24 1246  BP: 104/67  (!) 110/91 119/82  Pulse: 88  85   Resp: 17  18   Temp: (!) 97.4 F (36.3 C)  (!) 97.4 F (36.3 C)   TempSrc:      SpO2: 100%  100%   Weight:  (P) 65.3 kg    Height:         Author: Loyce Dys, MD 01/29/2024 4:14 PM  For on call review  [email or web address].

## 2024-01-29 NOTE — TOC Progression Note (Signed)
Transition of Care [facility]) - Progression Note    Patient Details  Name: Meghan Jarvis MRN: [id number] Date of Birth: [birth year]  [facility]) CM/SW Contact  Allena Katz, LCSW Phone Number: 01/29/2024, 2:12 PM  Clinical Narrative:    Madilyn Hook from DSS has called asking for a letter from MD stating pt does not have capacity. CSW has sent request to dr djan.        Expected Discharge Plan and Services         Expected Discharge Date: 01/10/24                                     Social Determinants of Health (SDOH) Interventions SDOH Screenings   Food Insecurity: Patient Unable To Answer (12/09/2023)  Recent Concern: Food Insecurity - Food Insecurity Present (09/26/2023)  Housing: Patient Unable To Answer (12/09/2023)  Recent Concern: Housing - Medium Risk (09/26/2023)  Transportation Needs: Patient Unable To Answer (12/09/2023)  Recent Concern: Transportation Needs - Unmet Transportation Needs (10/11/2023)  Utilities: Patient Unable To Answer (12/09/2023)  Recent Concern: Utilities - At Risk (09/25/2023)  Tobacco Use: High Risk (12/08/2023)    Readmission Risk Interventions     No data to display

## 2024-01-29 NOTE — Progress Notes (Signed)
Date of Admission:  12/08/2023     ID: Meghan Jarvis is a 48 y.o. female  Principal Problem:   Acute metabolic encephalopathy Active Problems:   Alcohol abuse   Acute hypoxic respiratory failure (HCC)   Pneumonia of right lower lobe due to infectious organism   Protein-calorie malnutrition, severe   Uncontrolled type 2 diabetes mellitus with hypoglycemia, without long-term current use of insulin (HCC)   Cocaine abuse (HCC)   AIDS (HCC)   Severe sepsis (HCC)   Chest pain   Pancytopenia (HCC)   Generalized weakness   Diarrhea   Right upper quadrant abdominal pain   Headache   Underweight (BMI < 18.5)   Hypotension   Abdominal pain   Encounter for psychiatric assessment   Rash due to allergy   Right knee pain   AKI (acute kidney injury) (HCC)  ? Meghan Jarvis is a 48 y.o. with a history of AIDS, not compliant with meds or visits ,  , cocaine use was brought in by EMS after being found unresponsive on the couch  in an aquaintance place-   Subjective: She is doing fine No complaints Medications:   atovaquone  1,500 mg Oral Q breakfast   azithromycin  1,200 mg Oral Weekly   bictegravir-emtricitabine-tenofovir AF  1 tablet Oral Daily   docusate sodium  100 mg Oral BID   DULoxetine  30 mg Oral QHS   enoxaparin (LOVENOX) injection  40 mg Subcutaneous QHS   feeding supplement  237 mL Oral QID   folic acid  1 mg Oral Daily   hydrocerin   Topical BID   lidocaine  1 patch Transdermal Q24H   midodrine  10 mg Oral TID WC   multivitamin with minerals  1 tablet Oral QHS   pantoprazole  40 mg Oral BID   thiamine  100 mg Oral Daily    Objective: Vital signs in last 24 hours: Patient Vitals for the past 24 hrs:  BP Temp Pulse Resp SpO2 Weight  01/29/24 1246 119/82 -- -- -- -- --  01/29/24 0457 (!) 110/91 (!) 97.4 F (36.3 C) 85 18 100 % --  01/29/24 0451 -- -- -- -- -- (P) 65.3 kg  01/28/24 2052 104/67 (!) 97.4 F (36.3 C) 88 17 100 % --  Awake and alert Respond to  questions appropriately But not much of insight into her condition [id number] Lungs clear to auscultation Abdomen soft Skin- hyperpigmentation, dryness, scaling has improved significantly. CNS grossly non focal   Lab Results    Latest Ref Rng & Units 01/29/2024    5:36 AM 01/20/2024    5:08 AM 01/12/2024    5:11 AM  CBC  WBC 4.0 - 10.5 K/uL 5.2  6.4  7.4   Hemoglobin 12.0 - 15.0 g/dL 9.1  9.5  8.4   Hematocrit 36.0 - 46.0 % 28.6  30.6  27.0   Platelets 150 - 400 K/uL 263  279  263        Latest Ref Rng & Units 01/29/2024    5:36 AM 01/20/2024    5:08 AM 01/12/2024    5:11 AM  CMP  Glucose 70 - 99 mg/dL 80  96  92   BUN 6 - 20 mg/dL 28  40  27   Creatinine 0.44 - 1.00 mg/dL 0.98  1.19  1.47   Sodium 135 - 145 mmol/L 138  139  138   Potassium 3.5 - 5.1 mmol/L 4.2  4.3  4.1  Chloride 98 - 111 mmol/L 104  103  103   CO2 22 - 32 mmol/L 25  27  25    Calcium 8.9 - 10.3 mg/dL 9.8  9.5  9.2       Microbiology: 12/28 BC NG  Toxo IgG > 400 Toxo PCR neg Toxo igM neg RPR NR CMV DNA neg Crypto neg HIV RNA 2 million>> 34, 000 Cd4 is 14 ( 2.4%) repeat on 1/29 is 416 ( 23%) Beta D glucan > 500 (  repeated) Histoplasma neg Fungal antibodies negative Genosure prime- no resistant mutations QuantiFERON gold indeterminate.  No treatment needed now.  Can repeat it later.     Assessment/Plan: Encephalopathy secondary to cocaine use as resolved  Aspiration leading to acute hypoxic respiratory failure with right middle lobe and lower lobe consolidation.  Status post intubation and was mechanically ventilated in the early part of the admission.  Now has been extubated for many days Took treatment with Zosyn completed treatment repeat chest x-ray is clear      Rt sided  flank, chest pain-intermittent-- has resolved  CT abd ok- MRI spine deg changes- no acute findings  Rash- resolved likely was due to bactrim- changed to Atovaquone- Mepron   H/o fall with recurrent ED presentation- had  hurt left shoulder before     Pruritus with excoriations and hyperpigmentiaon Much better ? Bactrim related rash on basleine dry and excoriated skin    AIDS- non compliant with meds or visits to her Provider- not been in care since 2021- used to be followed at [facility] before Was on Biktarvy at one time and was non compliant then VL now is 2 million and cd4 is 11  Started Biktarvy on 12/14/23 . not sure how much compliance to be expected as her social situation is not conducive and so is her substance use- watch closely for Immune reconstitution inflammatory syndrome Genotype no resistance to NRTI, NNRTI, Integrase inhibitor and PI Repeat Vl shows a significant drop in 3 weeks from 2 million to 34,000 . Latest vl is 460 and CD4 is improved significantly from 11 to 413  RISK for IRIS- watch closely   Toxo IgG very high-  MRI brain no CNS lesions  . TOXO PCR negative On  PCP and MAI prophylaxis. Was On bactrim for PCP and Toxo prophylaxis- because of new rash concerning for bactrim allergy was changed to atovaquone Rash resolved  Beta D glucan high > 500 (fungal antibodies negative Just observe.    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

## 2024-01-30 DIAGNOSIS — G9341 Metabolic encephalopathy: Secondary | ICD-10-CM | POA: Diagnosis not present

## 2024-01-30 LAB — URINE DRUGS OF ABUSE SCREEN W ALC, ROUTINE (REF LAB)
Amphetamines, Urine: NEGATIVE ng/mL
Barbiturate, Ur: NEGATIVE ng/mL
Benzodiazepine Quant, Ur: NEGATIVE ng/mL
Cannabinoid Quant, Ur: NEGATIVE ng/mL
Cocaine (Metab.): NEGATIVE ng/mL
Ethanol U, Quan: NEGATIVE %
Methadone Screen, Urine: NEGATIVE ng/mL
Opiate Quant, Ur: NEGATIVE ng/mL
Phencyclidine, Ur: NEGATIVE ng/mL
Propoxyphene, Urine: NEGATIVE ng/mL

## 2024-01-30 NOTE — Plan of Care (Signed)
Pt is alert, walks in the hall with FWW and continuously asks for grham crackers and soda. Pt awaiting discharge.  Problem: Coping: Goal: Ability to adjust to condition or change in health will improve Outcome: Progressing   Problem: Fluid Volume: Goal: Ability to maintain a balanced intake and output will improve Outcome: Progressing   Problem: Health Behavior/Discharge Planning: Goal: Ability to identify and utilize available resources and services will improve Outcome: Progressing Goal: Ability to manage health-related needs will improve Outcome: Progressing   Problem: Metabolic: Goal: Ability to maintain appropriate glucose levels will improve Outcome: Progressing   Problem: Nutritional: Goal: Maintenance of adequate nutrition will improve Outcome: Progressing Goal: Progress toward achieving an optimal weight will improve Outcome: Progressing   Problem: Skin Integrity: Goal: Risk for impaired skin integrity will decrease Outcome: Progressing   Problem: Tissue Perfusion: Goal: Adequacy of tissue perfusion will improve Outcome: Progressing   Problem: Education: Goal: Knowledge of General Education information will improve Description: Including pain rating scale, medication(s)/side effects and non-pharmacologic comfort measures Outcome: Progressing   Problem: Health Behavior/Discharge Planning: Goal: Ability to manage health-related needs will improve Outcome: Progressing   Problem: Clinical Measurements: Goal: Ability to maintain clinical measurements within normal limits will improve Outcome: Progressing Goal: Will remain free from infection Outcome: Progressing Goal: Diagnostic test results will improve Outcome: Progressing Goal: Respiratory complications will improve Outcome: Progressing Goal: Cardiovascular complication will be avoided Outcome: Progressing   Problem: Activity: Goal: Risk for activity intolerance will decrease Outcome: Progressing    Problem: Nutrition: Goal: Adequate nutrition will be maintained Outcome: Progressing   Problem: Coping: Goal: Level of anxiety will decrease Outcome: Progressing   Problem: Elimination: Goal: Will not experience complications related to bowel motility Outcome: Progressing Goal: Will not experience complications related to urinary retention Outcome: Progressing   Problem: Pain Management: Goal: General experience of comfort will improve Outcome: Progressing   Problem: Safety: Goal: Ability to remain free from injury will improve Outcome: Progressing   Problem: Skin Integrity: Goal: Risk for impaired skin integrity will decrease Outcome: Progressing   Problem: Activity: Goal: Ability to tolerate increased activity will improve Outcome: Progressing   Problem: Respiratory: Goal: Ability to maintain a clear airway and adequate ventilation will improve Outcome: Progressing

## 2024-01-30 NOTE — TOC Progression Note (Signed)
Transition of Care [facility]) - Progression Note    Patient Details  Name: Meghan Jarvis MRN: [id number] Date of Birth: [birth year]  Transition of Care [facility]) CM/SW Contact  Allena Katz, LCSW Phone Number: 01/30/2024, 4:11 PM  Clinical Narrative:     CSW spoke with brenda chandler who states pt is unable to be discharged due to pt lacking capacity and DSS working on guardianship. Steward Drone has staffed this with director Zack brooks who has agreed.        Expected Discharge Plan and Services         Expected Discharge Date: 01/10/24                                     Social Determinants of Health (SDOH) Interventions SDOH Screenings   Food Insecurity: Patient Unable To Answer (12/09/2023)  Recent Concern: Food Insecurity - Food Insecurity Present (09/26/2023)  Housing: Patient Unable To Answer (12/09/2023)  Recent Concern: Housing - Medium Risk (09/26/2023)  Transportation Needs: Patient Unable To Answer (12/09/2023)  Recent Concern: Transportation Needs - Unmet Transportation Needs (10/11/2023)  Utilities: Patient Unable To Answer (12/09/2023)  Recent Concern: Utilities - At Risk (09/25/2023)  Tobacco Use: High Risk (12/08/2023)    Readmission Risk Interventions     No data to display

## 2024-01-30 NOTE — Progress Notes (Signed)
PROGRESS NOTE    Meghan Jarvis   BJY:[id number] DOB: 1976-09-02  DOA: 12/08/2023 Date of Service: 01/30/24 which is hospital day 70  PCP: [facility] course / significant events:   HPI: 48 year old female past medical history of alcohol abuse, cocaine abuse, homelessness, AIDS, atrial fibrillation, hypothyroidism, type 2 diabetes mellitus presented to [facility] with acute respiratory failure.  She was found down by a friend and could not wake her up.  She was found to be hypoxic in the 70s.   12/08/23: admitted to ICU intubated started on antibiotics for pneumonia.  Urine toxicology positive for cocaine.  12/31: extubated. ID consulted  12/12/2023: hospitalist pickup  1/2.  Patient complained of chest pain.  Cardiac enzymes negative x 2 and EKG ok. 1/3.  Patient complaining of abdominal pain. RUQ [place] negative. MRI of the brain did not show any toxoplasmosis lesions but did show advanced cerebral atrophy. 1/4.  Patient complaining of headache.  1/5.  Blood pressure this morning was low. Started on midodrine. 1/6.  Patient complaining of a lot of abdominal pain.  CT scan does not show any acute process and shows better aeration of right lower lung.  CT did show constipation.  Will start MiraLAX. 1/15.  Still do not have a TOC plan at this point of time.  TOC reached out to APS.  Patient complains of right knee pain.  Uric acid 4.4.  X-ray negative. 1/23: Patient refusing to answer orientation questions and becoming agitated.  Looks like does not have capacity to make decision but not cooperative for any proper evaluation. 1/28: Psych reevaluated her, patient has capacity to make medical decision but need assistance with housing and finances due to cognitive decline. 02/17: reassess by psychiatry "Patient is lacking and reasoning in her medical care for example she is unable to describe the consequence that she will have if she does not take her medications or has  complications with her chronic conditions." To 02/19: continues to complain intermittently every few days of leg pain, chest pain, knee pain - workup continues to be negative, pain controlled on Rx. Still no dispo plan.      Consultants:  Infectious disease   Procedures/Surgeries: none      ASSESSMENT & PLAN:   Acute metabolic encephalopathy-improved Mental status improved from admission but patient is not the best historian and does not capacity to make decisions at this time.  DSS referral by TOC.  MRI did not show any enhancing lesions but did show severe cerebral atrophy.  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 Mental status improved but still lacks capacity per psychiatry  Continue to monitor mental status TOC working on placement / DSS is involved    abdominal pain Patient intermittently complains of abdominal pain, no change in character.  Prior imaging is negative.  Patient having bowel movements.  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  Infectious disease following.  Placed on PCP and MAI prophylaxis with atovaquone and Zithromax.  Toxoplasma antibody IgG came back elevated and MRI of the brain does not show any enhancing lesions.  Infectious disease specialist started on Biktarvy.  Repeat viral load down to 34,300.  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  Platelet platelet count up in the normal range at  285, white blood cell count up in the normal range at 6.6, hemoglobin 8.1.   Protein-calorie malnutrition, severe Continue supplementation   Severe sepsis (HCC) Present on admission with tachycardia, tachypnea, fever, pneumonia, acute respiratory failure and acute metabolic encephalopathy.  Patient completed antibiotics.   Cocaine  abuse (HCC) Patient has been counseled on cessation of cocaine use   Pneumonia of right lower lobe due to infectious organism Completed 5 days of antibiotic.   Acute hypoxic respiratory failure (HCC) Resolved.  Patient extubated on 12/30.  Patient currently breathing on room air.   Alcohol abuse Continue thiamine multivitamin and folic acid   Uncontrolled type 2 diabetes mellitus with hypoglycemia, without long-term current use of insulin (HCC) Hemoglobin A1c 6.0.    Generalized weakness Continue PT OT   Chest pain Atypical in nature.  Troponin x 2 negative.  EKG did not show any ST elevations.  Repeat EKG on 1/17 did not show any ST elevations.   AKI (acute kidney injury) (HCC) Continue monitoring Encouraged on adequate hydration   Right knee pain Patient asked for Ace bandage. She has not been wearing this. Intermittently complains of knee pain. Right knee x-ray negative.  Uric acid 4.4.  Continue to monitor.   Underweight (BMI < 18.5) Last BMI 18.54   Diarrhea-resolved continue as needed Colace for any constipation  Rectal pain and scant BRBPR Mild hemorrhoid on exam Anusol cream prn   Borderline underweight based on BMI: Body mass index is 19.51 kg/m.  Underweight - under 18  overweight - 25 to 29 obese - 30 or more Class 1 obesity: BMI of 30.0 to 34 Class 2 obesity: BMI of 35.0 to 39 Class 3 obesity: BMI of 40.0 to 49 Super Morbid Obesity: BMI 50-59 Super-super Morbid Obesity: BMI 60+ Significantly low or high BMI is associated with higher medical risk.  Weight management advised as adjunct to other disease management and risk reduction treatments    DVT prophylaxis: lovenox  IV fluids: no continuous IV fluids  Nutrition: dysphagia Central lines / invasive devices: none  Code Status: FULL CODE  ACP documentation reviewed:  none on file in [facility]  TOC needs: placement Barriers to dispo / significant pending items: placement               Subjective / Brief ROS:  Patient reports leg pain/numbness is about the same as usual Denies CP/SOB.  Pain controlled.  Denies new weakness.  Tolerating diet.  Reports no concerns w/ urination/defecation.   Family Communication: fiancee is at bedside on rounds - he states he is willing to take her home and take care of her - I asked her if it was ok if we discuss her care w/ him a bit - she assents. I asked him if he knows why it is VERY IMPORTANT she take her medications EVERY DAY and stay off recreational drugs. He states because of her pain meds and her HIV. I did not disclose patient's HIV status to him, he reported it to me.      Objective Findings:  Vitals:   01/29/24 2032 01/30/24 0428 01/30/24 0820 01/30/24 1227  BP: 106/75 96/78 97/73  102/68  Pulse: 83 96 88 96  Resp:   17   Temp: (!) 97.3 F (36.3 C) 97.8 F (36.6 C) 98.3 F (36.8 C)   TempSrc:      SpO2: 100% 100% 100%   Weight:      Height:        Intake/Output Summary (Last  24 hours) at 01/30/2024 1516 Last data filed at 01/30/2024 1300 Gross per 24 hour  Intake 240 ml  Output --  Net 240 ml   Filed Weights   01/25/24 0500 01/26/24 0500 01/29/24 0451  Weight: 68.1 kg 65.6 kg (P) 65.3 kg    Examination:  Physical Exam Cardiovascular:     Rate and Rhythm: Normal rate and regular rhythm.  Pulmonary:     Effort: Pulmonary effort is normal.     Breath sounds: Normal breath sounds.  Neurological:     General: No focal deficit present.     Mental Status: She is alert. Mental status is at baseline.  Psychiatric:        Mood and Affect: Mood normal.        Behavior: Behavior normal.          Scheduled Medications:   atovaquone  1,500 mg Oral Q breakfast   azithromycin  1,200 mg Oral Weekly   bictegravir-emtricitabine-tenofovir AF  1 tablet Oral Daily   docusate sodium  100 mg Oral BID   DULoxetine  30 mg Oral QHS   enoxaparin (LOVENOX) injection  40 mg Subcutaneous QHS    feeding supplement  237 mL Oral QID   folic acid  1 mg Oral Daily   hydrocerin   Topical BID   lidocaine  1 patch Transdermal Q24H   midodrine  10 mg Oral TID WC   multivitamin with minerals  1 tablet Oral QHS   pantoprazole  40 mg Oral BID   thiamine  100 mg Oral Daily    Continuous Infusions:   PRN Medications:  acetaminophen, cyclobenzaprine, dicyclomine, diphenhydrAMINE, hydrocortisone, ipratropium-albuterol, lactulose, nicotine polacrilex, ondansetron, oxyCODONE, polyethylene glycol  Antimicrobials from admission:  Anti-infectives (From admission, onward)    Start     Dose/Rate Route Frequency Ordered Stop   01/25/24 0000  azithromycin (ZITHROMAX) 600 MG tablet        1,200 mg Oral Weekly 01/25/24 1610     01/25/24 0000  bictegravir-emtricitabine-tenofovir AF (BIKTARVY) 50-200-25 MG TABS tablet        1 tablet Oral Daily 01/25/24 1610     01/25/24 0000  atovaquone (MEPRON) 750 MG/5ML suspension        1,500 mg Oral Daily with breakfast 01/25/24 1610     12/25/23 0800  atovaquone (MEPRON) 750 MG/5ML suspension 1,500 mg        1,500 mg Oral Daily with breakfast 12/24/23 1558     12/24/23 1000  sulfamethoxazole-trimethoprim (BACTRIM) 400-80 MG per tablet 1 tablet  Status:  Discontinued        1 tablet Oral Daily 12/24/23 0749 12/24/23 1557   12/19/23 1400  azithromycin (ZITHROMAX) tablet 1,200 mg        1,200 mg Oral Weekly 12/18/23 1346     12/18/23 1000  sulfamethoxazole-trimethoprim (BACTRIM DS) 800-160 MG per tablet 1 tablet  Status:  Discontinued        1 tablet Oral Daily 12/17/23 1621 12/24/23 0748   12/14/23 1530  bictegravir-emtricitabine-tenofovir AF (BIKTARVY) 50-200-25 MG per tablet 1 tablet        1 tablet Oral Daily 12/14/23 1442     12/12/23 1000  amoxicillin-clavulanate (AUGMENTIN) 875-125 MG per tablet 1 tablet        1 tablet Oral Every 12 hours 12/11/23 1345 12/12/23 2131   12/12/23 1000  azithromycin (ZITHROMAX) tablet 1,250 mg  Status:  Discontinued         1,200 mg Oral Weekly 12/11/23 1618 12/18/23  1346   12/11/23 2000  sulfamethoxazole-trimethoprim (BACTRIM) 400-80 MG per tablet 1 tablet  Status:  Discontinued        1 tablet Oral Daily 12/11/23 1618 12/17/23 1621   12/09/23 2200  azithromycin (ZITHROMAX) 500 mg in sodium chloride 0.9 % 250 mL IVPB  Status:  Discontinued        500 mg 250 mL/hr over 60 Minutes Intravenous Every 24 hours 12/09/23 0734 12/11/23 1618   12/09/23 2000  cefTRIAXone (ROCEPHIN) 2 g in sodium chloride 0.9 % 100 mL IVPB  Status:  Discontinued        2 g 200 mL/hr over 30 Minutes Intravenous Every 24 hours 12/09/23 0953 12/09/23 1840   12/09/23 2000  piperacillin-tazobactam (ZOSYN) IVPB 3.375 g        3.375 g 12.5 mL/hr over 240 Minutes Intravenous Every 8 hours 12/09/23 1850 12/12/23 0037   12/09/23 0600  Ampicillin-Sulbactam (UNASYN) 3 g in sodium chloride 0.9 % 100 mL IVPB  Status:  Discontinued        3 g 200 mL/hr over 30 Minutes Intravenous Every 6 hours 12/09/23 0442 12/09/23 0953   12/08/23 2215  cefTRIAXone (ROCEPHIN) 2 g in sodium chloride 0.9 % 100 mL IVPB        2 g 200 mL/hr over 30 Minutes Intravenous Once 12/08/23 2214 12/08/23 2308   12/08/23 2215  azithromycin (ZITHROMAX) 500 mg in sodium chloride 0.9 % 250 mL IVPB        500 mg 250 mL/hr over 60 Minutes Intravenous  Once 12/08/23 2214 12/09/23 0000           Data Reviewed:  I have personally reviewed the following...  CBC: Recent Labs  Lab 01/29/24 0536  WBC 5.2  NEUTROABS 1.3*  HGB 9.1*  HCT 28.6*  MCV 86.1  PLT 263   Basic Metabolic Panel: Recent Labs  Lab 01/29/24 0536  NA 138  K 4.2  CL 104  CO2 25  GLUCOSE 80  BUN 28*  CREATININE 0.79  CALCIUM 9.8   GFR: Estimated Creatinine Clearance: 85.8 mL/min (by C-G formula based on SCr of 0.79 mg/dL). Liver Function Tests: No results for input(s): "AST", "ALT", "ALKPHOS", "BILITOT", "PROT", "ALBUMIN" in the last 168 hours. No results for input(s): "LIPASE", "AMYLASE"  in the last 168 hours. No results for input(s): "AMMONIA" in the last 168 hours. Coagulation Profile: No results for input(s): "INR", "PROTIME" in the last 168 hours. Cardiac Enzymes: No results for input(s): "CKTOTAL", "CKMB", "CKMBINDEX", "TROPONINI" in the last 168 hours. BNP (last 3 results) No results for input(s): "PROBNP" in the last 8760 hours. HbA1C: No results for input(s): "HGBA1C" in the last 72 hours. CBG: No results for input(s): "GLUCAP" in the last 168 hours. Lipid Profile: No results for input(s): "CHOL", "HDL", "LDLCALC", "TRIG", "CHOLHDL", "LDLDIRECT" in the last 72 hours. Thyroid Function Tests: No results for input(s): "TSH", "T4TOTAL", "FREET4", "T3FREE", "THYROIDAB" in the last 72 hours. Anemia Panel: No results for input(s): "VITAMINB12", "FOLATE", "FERRITIN", "TIBC", "IRON", "RETICCTPCT" in the last 72 hours. Most Recent Urinalysis On File:     Component Value Date/Time   COLORURINE YELLOW (A) 01/21/2024 1215   APPEARANCEUR HAZY (A) 01/21/2024 1215   APPEARANCEUR Clear 08/18/2014 1354   LABSPEC 1.023 01/21/2024 1215   LABSPEC 1.020 08/18/2014 1354   PHURINE 7.0 01/21/2024 1215   GLUCOSEU NEGATIVE 01/21/2024 1215   GLUCOSEU 50 mg/dL 69/62/9528 4132   HGBUR NEGATIVE 01/21/2024 1215   BILIRUBINUR NEGATIVE 01/21/2024 1215   BILIRUBINUR Negative  08/18/2014 1354   KETONESUR NEGATIVE 01/21/2024 1215   PROTEINUR NEGATIVE 01/21/2024 1215   NITRITE NEGATIVE 01/21/2024 1215   LEUKOCYTESUR MODERATE (A) 01/21/2024 1215   LEUKOCYTESUR Negative 08/18/2014 1354   Sepsis Labs: @LABRCNTIP (procalcitonin:4,lacticidven:4) Microbiology: No results found for this or any previous visit (from the past 240 hours).    Radiology Studies last 3 days: No results found.     Time spent: 50 min     Sunnie Nielsen, DO [facility] 01/30/2024, 3:16 PM    Dictation software may have been used to generate the above note. Typos may occur and escape review in  typed/dictated notes. Please contact Dr Lyn Hollingshead directly for clarity if needed.  Staff may message me via secure chat in Epic  but this may not receive an immediate response,  please page me for urgent matters!  If 7PM-7AM, please contact night coverage [email or web address]

## 2024-01-30 NOTE — Progress Notes (Signed)
Report called to Driss RN, pt to be transferred to [facility].

## 2024-01-31 DIAGNOSIS — G9341 Metabolic encephalopathy: Secondary | ICD-10-CM | POA: Diagnosis not present

## 2024-01-31 NOTE — Progress Notes (Signed)
PROGRESS NOTE    Meghan Jarvis   BJY:[id number] DOB: 10/25/1976  DOA: 12/08/2023 Date of Service: 01/31/24 which is hospital day 78  PCP: Healthcare, [facility] course / significant events:   HPI: 48 year old female past medical history of alcohol abuse, cocaine abuse, homelessness, AIDS, atrial fibrillation, hypothyroidism, type 2 diabetes mellitus presented to [facility] with acute respiratory failure.  She was found down by a friend and could not wake her up.  She was found to be hypoxic in the 70s.   12/08/23: admitted to ICU intubated started on antibiotics for pneumonia.  Urine toxicology positive for cocaine.  12/31: extubated. ID consulted  12/12/2023: hospitalist pickup  1/2.  Patient complained of chest pain.  Cardiac enzymes negative x 2 and EKG ok. 1/3.  Patient complaining of abdominal pain. RUQ [place] negative. MRI of the brain did not show any toxoplasmosis lesions but did show advanced cerebral atrophy. 1/4.  Patient complaining of headache.  1/5.  Blood pressure this morning was low. Started on midodrine. 1/6.  Patient complaining of a lot of abdominal pain.  CT scan does not show any acute process and shows better aeration of right lower lung.  CT did show constipation.  Will start MiraLAX. 1/15.  Still do not have a TOC plan at this point of time.  TOC reached out to APS.  Patient complains of right knee pain.  Uric acid 4.4.  X-ray negative. 1/23: Patient refusing to answer orientation questions and becoming agitated.  Looks like does not have capacity to make decision but not cooperative for any proper evaluation. 1/28: Psych reevaluated her, patient has capacity to make medical decision but need assistance with housing and finances due to cognitive decline. 02/17: reassess by psychiatry "Patient is lacking and reasoning in her medical care for example she is unable to describe the consequence that she will have if she does not take her medications or has  complications with her chronic conditions." To 02/19: continues to complain intermittently every few days of leg pain, chest pain, knee pain - workup continues to be negative, pain controlled on Rx. Still no dispo plan.      Consultants:  Infectious disease   Procedures/Surgeries: none      ASSESSMENT & PLAN:   Acute metabolic encephalopathy-improved Mental status improved from admission but patient is not the best historian and does not capacity to make decisions at this time.  DSS referral by TOC.  MRI did not show any enhancing lesions but did show severe cerebral atrophy.  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 Mental status improved but still lacks capacity per psychiatry  Continue to monitor mental status TOC working on placement / DSS is involved - DSS in process of establishing guardianship and TOC has advised hospitalist team for NO discharge prior to DSS approval, uncertain if safe to go home w/ Fiancee    abdominal pain Patient intermittently complains of abdominal pain, no change in character.  Prior imaging is negative.  Patient having bowel movements.  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  Infectious disease following.  Placed on PCP and MAI prophylaxis with atovaquone and Zithromax.  Toxoplasma antibody IgG came back elevated and MRI of the brain does not show any enhancing lesions.  Infectious disease specialist started on Biktarvy.  Repeat viral load down to 34,300.  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  Platelet platelet count up in the normal range at 285, white blood cell count up in the normal range at 6.6, hemoglobin 8.1.   Protein-calorie malnutrition, severe Continue supplementation   Severe sepsis (HCC) Present on  admission with tachycardia, tachypnea, fever, pneumonia, acute respiratory failure and acute metabolic encephalopathy.  Patient completed antibiotics.   Cocaine abuse (HCC) Patient has been counseled on cessation of cocaine use   Pneumonia of right lower lobe due to infectious organism Completed 5 days of antibiotic.   Acute hypoxic respiratory failure (HCC) Resolved.  Patient extubated on 12/30.  Patient currently breathing on room air.   Alcohol abuse Continue thiamine multivitamin and folic acid   Uncontrolled type 2 diabetes mellitus with hypoglycemia, without long-term current use of insulin (HCC) Hemoglobin A1c 6.0.    Generalized weakness Continue PT OT   Chest pain Atypical in nature.  Troponin x 2 negative.  EKG did not show any ST elevations.  Repeat EKG on 1/17 did not show any ST elevations.   AKI (acute kidney injury) (HCC) Continue monitoring Encouraged on adequate hydration   Right knee pain Patient asked for Ace bandage. She has not been wearing this. Intermittently complains of knee pain. Right knee x-ray negative.  Uric acid 4.4.  Continue to monitor.   Underweight (BMI < 18.5) Last BMI 18.54   Diarrhea-resolved continue as needed Colace for any constipation  Rectal pain and scant BRBPR Mild hemorrhoid on exam Anusol cream prn   Borderline underweight based on BMI: Body mass index is 19.51 kg/m.  Underweight - under 18  overweight - 25 to 29 obese - 30 or more Class 1 obesity: BMI of 30.0 to 34 Class 2 obesity: BMI of 35.0 to 39 Class 3 obesity: BMI of 40.0 to 49 Super Morbid Obesity: BMI 50-59 Super-super Morbid Obesity: BMI 60+ Significantly low or high BMI is associated with higher medical risk.  Weight management advised as adjunct to other disease management and risk reduction treatments    DVT prophylaxis: lovenox  IV fluids: no continuous IV fluids  Nutrition: dysphagia Central lines / invasive devices: none  Code Status: FULL CODE   ACP documentation reviewed:  none on file in [facility]  TOC needs: placement Barriers to dispo / significant pending items: placement / DSS in process of establishing guardianship and TOC has advised no discharge prior to DSS approval, uncertain if safe to go home w/ [name]              Subjective / Brief ROS:  Patient reports no concerns No concerns from RN   Family Communication: fiancee is at bedside   Objective Findings:  Vitals:   01/30/24 1733 01/30/24 2046 01/31/24 0450 01/31/24 0805  BP: 98/65 100/86 102/75 109/83  Pulse: 91 88 87 85  Resp: 18 18 18 18   Temp: 98.2 F (36.8 C) 98.2 F (36.8 C) 98.7 F (37.1 C) 98.6 F (37 C)  TempSrc: Oral Axillary Oral Oral  SpO2: 100% 100% 100% 100%  Weight:      Height:        Intake/Output Summary (Last 24 hours) at 01/31/2024 1238 Last data filed at 01/30/2024 1300 Gross per 24 hour  Intake 240 ml  Output --  Net 240 ml   Filed Weights   01/25/24 0500 01/26/24 0500 01/29/24 0451  Weight: 68.1 kg 65.6 kg (P) 65.3 kg    Examination:  Physical Exam Pulmonary:     Effort: Pulmonary effort is normal.  Neurological:     Mental Status: She is alert. Mental status is at baseline.  Psychiatric:        Mood and Affect: Mood normal.        Behavior: Behavior normal.          Scheduled Medications:   atovaquone  1,500 mg Oral Q breakfast   azithromycin  1,200 mg Oral Weekly   bictegravir-emtricitabine-tenofovir AF  1 tablet Oral Daily   docusate sodium  100 mg Oral BID   DULoxetine  30 mg Oral QHS   enoxaparin (LOVENOX) injection  40 mg Subcutaneous QHS   feeding supplement  237 mL Oral QID   folic acid  1 mg Oral Daily   hydrocerin   Topical BID   lidocaine  1 patch Transdermal Q24H   midodrine  10 mg Oral TID WC   multivitamin with minerals  1 tablet Oral QHS   pantoprazole  40 mg Oral BID   thiamine  100 mg Oral Daily    Continuous Infusions:   PRN Medications:  acetaminophen, cyclobenzaprine,  dicyclomine, diphenhydrAMINE, hydrocortisone, ipratropium-albuterol, lactulose, nicotine polacrilex, ondansetron, oxyCODONE, polyethylene glycol  Antimicrobials from admission:  Anti-infectives (From admission, onward)    Start     Dose/Rate Route Frequency Ordered Stop   01/25/24 0000  azithromycin (ZITHROMAX) 600 MG tablet        1,200 mg Oral Weekly 01/25/24 1610     01/25/24 0000  bictegravir-emtricitabine-tenofovir AF (BIKTARVY) 50-200-25 MG TABS tablet        1 tablet Oral Daily 01/25/24 1610     01/25/24 0000  atovaquone (MEPRON) 750 MG/5ML suspension        1,500 mg Oral Daily with breakfast 01/25/24 1610     12/25/23 0800  atovaquone (MEPRON) 750 MG/5ML suspension 1,500 mg        1,500 mg Oral Daily with breakfast 12/24/23 1558     12/24/23 1000  sulfamethoxazole-trimethoprim (BACTRIM) 400-80 MG per tablet 1 tablet  Status:  Discontinued        1 tablet Oral Daily 12/24/23 0749 12/24/23 1557   12/19/23 1400  azithromycin (ZITHROMAX) tablet 1,200 mg        1,200 mg Oral Weekly 12/18/23 1346     12/18/23 1000  sulfamethoxazole-trimethoprim (BACTRIM DS) 800-160 MG per tablet 1 tablet  Status:  Discontinued        1 tablet Oral Daily 12/17/23 1621 12/24/23 0748   12/14/23 1530  bictegravir-emtricitabine-tenofovir AF (BIKTARVY) 50-200-25 MG per tablet 1 tablet        1 tablet Oral Daily 12/14/23 1442     12/12/23 1000  amoxicillin-clavulanate (AUGMENTIN) 875-125 MG per tablet 1 tablet        1 tablet Oral Every 12 hours 12/11/23 1345 12/12/23 2131   12/12/23 1000  azithromycin (ZITHROMAX) tablet 1,250 mg  Status:  Discontinued        1,200 mg Oral Weekly 12/11/23 1618 12/18/23 1346   12/11/23 2000  sulfamethoxazole-trimethoprim (BACTRIM) 400-80 MG per tablet 1 tablet  Status:  Discontinued        1 tablet Oral Daily 12/11/23 1618 12/17/23 1621   12/09/23 2200  azithromycin (ZITHROMAX) 500 mg in sodium chloride 0.9 % 250 mL IVPB  Status:  Discontinued        500 mg 250 mL/hr over 60  Minutes Intravenous Every 24 hours 12/09/23 0734 12/11/23 1618   12/09/23 2000  cefTRIAXone (ROCEPHIN) 2  g in sodium chloride 0.9 % 100 mL IVPB  Status:  Discontinued        2 g 200 mL/hr over 30 Minutes Intravenous Every 24 hours 12/09/23 0953 12/09/23 1840   12/09/23 2000  piperacillin-tazobactam (ZOSYN) IVPB 3.375 g        3.375 g 12.5 mL/hr over 240 Minutes Intravenous Every 8 hours 12/09/23 1850 12/12/23 0037   12/09/23 0600  Ampicillin-Sulbactam (UNASYN) 3 g in sodium chloride 0.9 % 100 mL IVPB  Status:  Discontinued        3 g 200 mL/hr over 30 Minutes Intravenous Every 6 hours 12/09/23 0442 12/09/23 0953   12/08/23 2215  cefTRIAXone (ROCEPHIN) 2 g in sodium chloride 0.9 % 100 mL IVPB        2 g 200 mL/hr over 30 Minutes Intravenous Once 12/08/23 2214 12/08/23 2308   12/08/23 2215  azithromycin (ZITHROMAX) 500 mg in sodium chloride 0.9 % 250 mL IVPB        500 mg 250 mL/hr over 60 Minutes Intravenous  Once 12/08/23 2214 12/09/23 0000           Data Reviewed:  I have personally reviewed the following...  CBC: Recent Labs  Lab 01/29/24 0536  WBC 5.2  NEUTROABS 1.3*  HGB 9.1*  HCT 28.6*  MCV 86.1  PLT 263   Basic Metabolic Panel: Recent Labs  Lab 01/29/24 0536  NA 138  K 4.2  CL 104  CO2 25  GLUCOSE 80  BUN 28*  CREATININE 0.79  CALCIUM 9.8   GFR: Estimated Creatinine Clearance: 85.8 mL/min (by C-G formula based on SCr of 0.79 mg/dL). Liver Function Tests: No results for input(s): "AST", "ALT", "ALKPHOS", "BILITOT", "PROT", "ALBUMIN" in the last 168 hours. No results for input(s): "LIPASE", "AMYLASE" in the last 168 hours. No results for input(s): "AMMONIA" in the last 168 hours. Coagulation Profile: No results for input(s): "INR", "PROTIME" in the last 168 hours. Cardiac Enzymes: No results for input(s): "CKTOTAL", "CKMB", "CKMBINDEX", "TROPONINI" in the last 168 hours. BNP (last 3 results) No results for input(s): "PROBNP" in the last 8760  hours. HbA1C: No results for input(s): "HGBA1C" in the last 72 hours. CBG: No results for input(s): "GLUCAP" in the last 168 hours. Lipid Profile: No results for input(s): "CHOL", "HDL", "LDLCALC", "TRIG", "CHOLHDL", "LDLDIRECT" in the last 72 hours. Thyroid Function Tests: No results for input(s): "TSH", "T4TOTAL", "FREET4", "T3FREE", "THYROIDAB" in the last 72 hours. Anemia Panel: No results for input(s): "VITAMINB12", "FOLATE", "FERRITIN", "TIBC", "IRON", "RETICCTPCT" in the last 72 hours. Most Recent Urinalysis On File:     Component Value Date/Time   COLORURINE YELLOW (A) 01/21/2024 1215   APPEARANCEUR HAZY (A) 01/21/2024 1215   APPEARANCEUR Clear 08/18/2014 1354   LABSPEC 1.023 01/21/2024 1215   LABSPEC 1.020 08/18/2014 1354   PHURINE 7.0 01/21/2024 1215   GLUCOSEU NEGATIVE 01/21/2024 1215   GLUCOSEU 50 mg/dL 29/56/2130 8657   HGBUR NEGATIVE 01/21/2024 1215   BILIRUBINUR NEGATIVE 01/21/2024 1215   BILIRUBINUR Negative 08/18/2014 1354   KETONESUR NEGATIVE 01/21/2024 1215   PROTEINUR NEGATIVE 01/21/2024 1215   NITRITE NEGATIVE 01/21/2024 1215   LEUKOCYTESUR MODERATE (A) 01/21/2024 1215   LEUKOCYTESUR Negative 08/18/2014 1354   Sepsis Labs: @LABRCNTIP (procalcitonin:4,lacticidven:4) Microbiology: No results found for this or any previous visit (from the past 240 hours).    Radiology Studies last 3 days: No results found.     Time spent: 50 min     Sunnie Nielsen, DO [facility] 01/31/2024, 12:38 PM  Dictation software may have been used to generate the above note. Typos may occur and escape review in typed/dictated notes. Please contact Dr Lyn Hollingshead directly for clarity if needed.  Staff may message me via secure chat in Epic  but this may not receive an immediate response,  please page me for urgent matters!  If 7PM-7AM, please contact night coverage [email or web address]

## 2024-01-31 NOTE — Plan of Care (Signed)
  Problem: Coping: Goal: Ability to adjust to condition or change in health will improve Outcome: Progressing   Problem: Fluid Volume: Goal: Ability to maintain a balanced intake and output will improve Outcome: Progressing   Problem: Health Behavior/Discharge Planning: Goal: Ability to identify and utilize available resources and services will improve Outcome: Progressing Goal: Ability to manage health-related needs will improve Outcome: Progressing   Problem: Metabolic: Goal: Ability to maintain appropriate glucose levels will improve Outcome: Progressing   Problem: Nutritional: Goal: Maintenance of adequate nutrition will improve Outcome: Progressing Goal: Progress toward achieving an optimal weight will improve Outcome: Progressing   Problem: Skin Integrity: Goal: Risk for impaired skin integrity will decrease Outcome: Progressing   Problem: Tissue Perfusion: Goal: Adequacy of tissue perfusion will improve Outcome: Progressing   Problem: Education: Goal: Knowledge of General Education information will improve Description: Including pain rating scale, medication(s)/side effects and non-pharmacologic comfort measures Outcome: Progressing   Problem: Health Behavior/Discharge Planning: Goal: Ability to manage health-related needs will improve Outcome: Progressing   Problem: Clinical Measurements: Goal: Ability to maintain clinical measurements within normal limits will improve Outcome: Progressing Goal: Will remain free from infection Outcome: Progressing Goal: Diagnostic test results will improve Outcome: Progressing Goal: Respiratory complications will improve Outcome: Progressing Goal: Cardiovascular complication will be avoided Outcome: Progressing   Problem: Activity: Goal: Risk for activity intolerance will decrease Outcome: Progressing   Problem: Nutrition: Goal: Adequate nutrition will be maintained Outcome: Progressing   Problem: Coping: Goal:  Level of anxiety will decrease Outcome: Progressing   Problem: Elimination: Goal: Will not experience complications related to bowel motility Outcome: Progressing Goal: Will not experience complications related to urinary retention Outcome: Progressing   Problem: Pain Management: Goal: General experience of comfort will improve Outcome: Progressing   Problem: Safety: Goal: Ability to remain free from injury will improve Outcome: Progressing   Problem: Skin Integrity: Goal: Risk for impaired skin integrity will decrease Outcome: Progressing   Problem: Activity: Goal: Ability to tolerate increased activity will improve Outcome: Progressing   Problem: Respiratory: Goal: Ability to maintain a clear airway and adequate ventilation will improve Outcome: Progressing

## 2024-01-31 NOTE — Progress Notes (Signed)
Mobility Specialist - Progress Note   01/31/24 1100  Mobility  Activity Ambulated with assistance in hallway  Level of Assistance Contact guard assist, steadying assist  Assistive Device Front wheel walker  Distance Ambulated (ft) 160 ft  Activity Response Tolerated well  Mobility visit 1 Mobility     Pt sitting EOB on arrival, utilizing RA. Pt agreeable to activity. Ambulated in hallway with CGA. Pt notably swaying this date and does lift RW onto 2 wheels x2 during activity. Limping gait that improved some with RW use. Reports pain in R hip. Pt returned to EOB with needs in reach, family at bedside.    Filiberto Pinks Mobility Specialist 01/31/24, 11:17 AM

## 2024-01-31 NOTE — Plan of Care (Signed)
  Problem: Coping: Goal: Ability to adjust to condition or change in health will improve Outcome: Progressing   Problem: Fluid Volume: Goal: Ability to maintain a balanced intake and output will improve Outcome: Progressing   Problem: Health Behavior/Discharge Planning: Goal: Ability to identify and utilize available resources and services will improve Outcome: Progressing Goal: Ability to manage health-related needs will improve Outcome: Progressing   Problem: Metabolic: Goal: Ability to maintain appropriate glucose levels will improve Outcome: Progressing   Problem: Skin Integrity: Goal: Risk for impaired skin integrity will decrease Outcome: Progressing   Problem: Tissue Perfusion: Goal: Adequacy of tissue perfusion will improve Outcome: Progressing   Problem: Education: Goal: Knowledge of General Education information will improve Description: Including pain rating scale, medication(s)/side effects and non-pharmacologic comfort measures Outcome: Progressing   Problem: Health Behavior/Discharge Planning: Goal: Ability to manage health-related needs will improve Outcome: Progressing   Problem: Clinical Measurements: Goal: Ability to maintain clinical measurements within normal limits will improve Outcome: Progressing Goal: Will remain free from infection Outcome: Progressing Goal: Diagnostic test results will improve Outcome: Progressing Goal: Respiratory complications will improve Outcome: Progressing Goal: Cardiovascular complication will be avoided Outcome: Progressing   Problem: Activity: Goal: Risk for activity intolerance will decrease Outcome: Progressing   Problem: Coping: Goal: Level of anxiety will decrease Outcome: Progressing   Problem: Elimination: Goal: Will not experience complications related to bowel motility Outcome: Progressing Goal: Will not experience complications related to urinary retention Outcome: Progressing   Problem: Pain  Management: Goal: General experience of comfort will improve Outcome: Progressing   Problem: Skin Integrity: Goal: Risk for impaired skin integrity will decrease Outcome: Progressing   Problem: Activity: Goal: Ability to tolerate increased activity will improve Outcome: Progressing   Problem: Respiratory: Goal: Ability to maintain a clear airway and adequate ventilation will improve Outcome: Progressing

## 2024-02-01 DIAGNOSIS — G9341 Metabolic encephalopathy: Secondary | ICD-10-CM | POA: Diagnosis not present

## 2024-02-01 DIAGNOSIS — B20 Human immunodeficiency virus [HIV] disease: Secondary | ICD-10-CM | POA: Diagnosis not present

## 2024-02-01 DIAGNOSIS — F1490 Cocaine use, unspecified, uncomplicated: Secondary | ICD-10-CM | POA: Diagnosis not present

## 2024-02-01 DIAGNOSIS — J9601 Acute respiratory failure with hypoxia: Secondary | ICD-10-CM | POA: Diagnosis not present

## 2024-02-01 DIAGNOSIS — F1010 Alcohol abuse, uncomplicated: Secondary | ICD-10-CM | POA: Diagnosis not present

## 2024-02-01 NOTE — TOC Progression Note (Addendum)
Transition of Care [facility]) - Progression Note    Patient Details  Name: Meghan Jarvis MRN: [id number] Date of Birth: 1976-10-31  [facility] CM/SW Contact  Margarito Liner, LCSW Phone Number: 02/01/2024, 11:16 AM  Clinical Narrative:   TOC continues to follow. CSW discussed case with patient's [employer] case Production designer, theatre/television/film.  Expected Discharge Plan and Services         Expected Discharge Date: 01/10/24                                     Social Determinants of Health (SDOH) Interventions SDOH Screenings   Food Insecurity: Patient Unable To Answer (12/09/2023)  Recent Concern: Food Insecurity - Food Insecurity Present (09/26/2023)  Housing: Patient Unable To Answer (12/09/2023)  Recent Concern: Housing - Medium Risk (09/26/2023)  Transportation Needs: Patient Unable To Answer (12/09/2023)  Recent Concern: Transportation Needs - Unmet Transportation Needs (10/11/2023)  Utilities: Patient Unable To Answer (12/09/2023)  Recent Concern: Utilities - At Risk (09/25/2023)  Tobacco Use: High Risk (12/08/2023)    Readmission Risk Interventions     No data to display

## 2024-02-01 NOTE — Progress Notes (Signed)
PROGRESS NOTE    Meghan Jarvis   ZOX:[id number] DOB: 1976/07/30  DOA: 12/08/2023 Date of Service: 02/01/24 which is hospital day 55  PCP: [facility] course / significant events:   HPI: 48 year old female past medical history of alcohol abuse, cocaine abuse, homelessness, AIDS, atrial fibrillation, hypothyroidism, type 2 diabetes mellitus presented to [facility] with acute respiratory failure.  She was found down by a friend and could not wake her up.  She was found to be hypoxic in the 70s.   12/08/23: admitted to ICU intubated started on antibiotics for pneumonia.  Urine toxicology positive for cocaine.  12/31: extubated. ID consulted  12/12/2023: hospitalist pickup  1/2.  Patient complained of chest pain.  Cardiac enzymes negative x 2 and EKG ok. 1/3.  Patient complaining of abdominal pain. RUQ [place] negative. MRI of the brain did not show any toxoplasmosis lesions but did show advanced cerebral atrophy. 1/4.  Patient complaining of headache.  1/5.  Blood pressure this morning was low. Started on midodrine. 1/6.  Patient complaining of a lot of abdominal pain.  CT scan does not show any acute process and shows better aeration of right lower lung.  CT did show constipation.  Will start MiraLAX. 1/15.  Still do not have a TOC plan at this point of time.  TOC reached out to APS.  Patient complains of right knee pain.  Uric acid 4.4.  X-ray negative. 1/23: Patient refusing to answer orientation questions and becoming agitated.  Looks like does not have capacity to make decision but not cooperative for any proper evaluation. 1/28: Psych reevaluated her, patient has capacity to make medical decision but need assistance with housing and finances due to cognitive decline. 02/17: reassess by psychiatry "Patient is lacking and reasoning in her medical care for example she is unable to describe the consequence that she will have if she does not take her medications or has  complications with her chronic conditions." To 02/21: continues to complain intermittently every few days of leg pain, chest pain, knee pain - workup continues to be negative, pain controlled on Rx. Still no dispo plan.      Consultants:  Infectious disease   Procedures/Surgeries: none      ASSESSMENT & PLAN:   Acute metabolic encephalopathy-improved Mental status improved from admission but patient is not the best historian and does not capacity to make decisions at this time.  DSS referral by TOC.  MRI did not show any enhancing lesions but did show severe cerebral atrophy.  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 Mental status improved but still lacks capacity per psychiatry  Continue to monitor mental status TOC working on placement / DSS is involved - DSS in process of establishing guardianship and TOC has advised hospitalist team for NO discharge prior to DSS approval, uncertain if safe to go home w/ Fiancee    abdominal pain Patient intermittently complains of abdominal pain, no change in character.  Prior imaging is negative.  Patient having bowel movements.  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  Infectious disease following.  Placed on PCP and MAI prophylaxis with atovaquone and Zithromax.  Toxoplasma antibody IgG came back elevated and MRI of the brain does not show any enhancing lesions.  Infectious disease specialist started on Biktarvy.  Repeat viral load down to 34,300.  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  Platelet platelet count up in the normal range at 285, white blood cell count up in the normal range at 6.6, hemoglobin 8.1.   Protein-calorie malnutrition, severe Continue supplementation   Severe sepsis (HCC) Present on  admission with tachycardia, tachypnea, fever, pneumonia, acute respiratory failure and acute metabolic encephalopathy.  Patient completed antibiotics.   Cocaine abuse (HCC) Patient has been counseled on cessation of cocaine use   Pneumonia of right lower lobe due to infectious organism Completed 5 days of antibiotic.   Acute hypoxic respiratory failure (HCC) Resolved.  Patient extubated on 12/30.  Patient currently breathing on room air.   Alcohol abuse Continue thiamine multivitamin and folic acid   Uncontrolled type 2 diabetes mellitus with hypoglycemia, without long-term current use of insulin (HCC) Hemoglobin A1c 6.0.    Generalized weakness Continue PT OT   Chest pain Atypical in nature.  Troponin x 2 negative.  EKG did not show any ST elevations.  Repeat EKG on 1/17 did not show any ST elevations.   AKI (acute kidney injury) (HCC) Continue monitoring Encouraged on adequate hydration   Right knee pain Patient asked for Ace bandage. She has not been wearing this. Intermittently complains of knee pain. Right knee x-ray negative.  Uric acid 4.4.  Continue to monitor.   Underweight (BMI < 18.5) Last BMI 18.54   Diarrhea-resolved continue as needed Colace for any constipation  Rectal pain and scant BRBPR Mild hemorrhoid on exam Anusol cream prn   Borderline underweight based on BMI: Body mass index is 19.51 kg/m.  Underweight - under 18  overweight - 25 to 29 obese - 30 or more Class 1 obesity: BMI of 30.0 to 34 Class 2 obesity: BMI of 35.0 to 39 Class 3 obesity: BMI of 40.0 to 49 Super Morbid Obesity: BMI 50-59 Super-super Morbid Obesity: BMI 60+ Significantly low or high BMI is associated with higher medical risk.  Weight management advised as adjunct to other disease management and risk reduction treatments    DVT prophylaxis: lovenox  IV fluids: no continuous IV fluids  Nutrition: dysphagia Central lines / invasive devices: none  Code Status: FULL CODE   ACP documentation reviewed:  none on file in [facility]  TOC needs: placement Barriers to dispo / significant pending items: placement / DSS in process of establishing guardianship and TOC has advised no discharge prior to DSS approval, uncertain if safe to go home w/ [name]              Subjective / Brief ROS:  Patient reports no concerns No concerns from RN   Family Communication: fiancee is at bedside   Objective Findings:  Vitals:   01/31/24 1552 01/31/24 2027 02/01/24 0435 02/01/24 0912  BP: (!) 106/93 100/73 97/61 101/66  Pulse: 81 99 98 (!) 105  Resp: 16 18 18 18   Temp: (!) 97.5 F (36.4 C) 98 F (36.7 C) 98.1 F (36.7 C) 98.1 F (36.7 C)  TempSrc: Oral Oral Oral Oral  SpO2: 96% 100% 100% 99%  Weight:      Height:        Intake/Output Summary (Last 24 hours) at 02/01/2024 1548 Last data filed at 01/31/2024 1900 Gross per 24 hour  Intake 240 ml  Output --  Net 240 ml   Filed Weights   01/25/24 0500 01/26/24 0500 01/29/24 0451  Weight: 68.1 kg 65.6 kg (P) 65.3 kg  Examination:  Physical Exam Pulmonary:     Effort: Pulmonary effort is normal.  Neurological:     Mental Status: She is alert. Mental status is at baseline.  Psychiatric:        Mood and Affect: Mood normal.        Behavior: Behavior normal.          Scheduled Medications:   atovaquone  1,500 mg Oral Q breakfast   azithromycin  1,200 mg Oral Weekly   bictegravir-emtricitabine-tenofovir AF  1 tablet Oral Daily   docusate sodium  100 mg Oral BID   DULoxetine  30 mg Oral QHS   enoxaparin (LOVENOX) injection  40 mg Subcutaneous QHS   feeding supplement  237 mL Oral QID   folic acid  1 mg Oral Daily   hydrocerin   Topical BID   lidocaine  1 patch Transdermal Q24H   midodrine  10 mg Oral TID WC   multivitamin with minerals  1 tablet Oral QHS   pantoprazole  40 mg Oral BID   thiamine  100 mg Oral Daily    Continuous Infusions:   PRN Medications:  acetaminophen,  cyclobenzaprine, dicyclomine, diphenhydrAMINE, hydrocortisone, ipratropium-albuterol, lactulose, nicotine polacrilex, ondansetron, oxyCODONE, polyethylene glycol  Antimicrobials from admission:  Anti-infectives (From admission, onward)    Start     Dose/Rate Route Frequency Ordered Stop   01/25/24 0000  azithromycin (ZITHROMAX) 600 MG tablet        1,200 mg Oral Weekly 01/25/24 1610     01/25/24 0000  bictegravir-emtricitabine-tenofovir AF (BIKTARVY) 50-200-25 MG TABS tablet        1 tablet Oral Daily 01/25/24 1610     01/25/24 0000  atovaquone (MEPRON) 750 MG/5ML suspension        1,500 mg Oral Daily with breakfast 01/25/24 1610     12/25/23 0800  atovaquone (MEPRON) 750 MG/5ML suspension 1,500 mg        1,500 mg Oral Daily with breakfast 12/24/23 1558     12/24/23 1000  sulfamethoxazole-trimethoprim (BACTRIM) 400-80 MG per tablet 1 tablet  Status:  Discontinued        1 tablet Oral Daily 12/24/23 0749 12/24/23 1557   12/19/23 1400  azithromycin (ZITHROMAX) tablet 1,200 mg        1,200 mg Oral Weekly 12/18/23 1346     12/18/23 1000  sulfamethoxazole-trimethoprim (BACTRIM DS) 800-160 MG per tablet 1 tablet  Status:  Discontinued        1 tablet Oral Daily 12/17/23 1621 12/24/23 0748   12/14/23 1530  bictegravir-emtricitabine-tenofovir AF (BIKTARVY) 50-200-25 MG per tablet 1 tablet        1 tablet Oral Daily 12/14/23 1442     12/12/23 1000  amoxicillin-clavulanate (AUGMENTIN) 875-125 MG per tablet 1 tablet        1 tablet Oral Every 12 hours 12/11/23 1345 12/12/23 2131   12/12/23 1000  azithromycin (ZITHROMAX) tablet 1,250 mg  Status:  Discontinued        1,200 mg Oral Weekly 12/11/23 1618 12/18/23 1346   12/11/23 2000  sulfamethoxazole-trimethoprim (BACTRIM) 400-80 MG per tablet 1 tablet  Status:  Discontinued        1 tablet Oral Daily 12/11/23 1618 12/17/23 1621   12/09/23 2200  azithromycin (ZITHROMAX) 500 mg in sodium chloride 0.9 % 250 mL IVPB  Status:  Discontinued        500  mg 250 mL/hr over 60 Minutes Intravenous Every 24 hours 12/09/23 0734 12/11/23 1618   12/09/23 2000  cefTRIAXone (  ROCEPHIN) 2 g in sodium chloride 0.9 % 100 mL IVPB  Status:  Discontinued        2 g 200 mL/hr over 30 Minutes Intravenous Every 24 hours 12/09/23 0953 12/09/23 1840   12/09/23 2000  piperacillin-tazobactam (ZOSYN) IVPB 3.375 g        3.375 g 12.5 mL/hr over 240 Minutes Intravenous Every 8 hours 12/09/23 1850 12/12/23 0037   12/09/23 0600  Ampicillin-Sulbactam (UNASYN) 3 g in sodium chloride 0.9 % 100 mL IVPB  Status:  Discontinued        3 g 200 mL/hr over 30 Minutes Intravenous Every 6 hours 12/09/23 0442 12/09/23 0953   12/08/23 2215  cefTRIAXone (ROCEPHIN) 2 g in sodium chloride 0.9 % 100 mL IVPB        2 g 200 mL/hr over 30 Minutes Intravenous Once 12/08/23 2214 12/08/23 2308   12/08/23 2215  azithromycin (ZITHROMAX) 500 mg in sodium chloride 0.9 % 250 mL IVPB        500 mg 250 mL/hr over 60 Minutes Intravenous  Once 12/08/23 2214 12/09/23 0000           Data Reviewed:  I have personally reviewed the following...  CBC: Recent Labs  Lab 01/29/24 0536  WBC 5.2  NEUTROABS 1.3*  HGB 9.1*  HCT 28.6*  MCV 86.1  PLT 263   Basic Metabolic Panel: Recent Labs  Lab 01/29/24 0536  NA 138  K 4.2  CL 104  CO2 25  GLUCOSE 80  BUN 28*  CREATININE 0.79  CALCIUM 9.8   GFR: Estimated Creatinine Clearance: 85.8 mL/min (by C-G formula based on SCr of 0.79 mg/dL). Liver Function Tests: No results for input(s): "AST", "ALT", "ALKPHOS", "BILITOT", "PROT", "ALBUMIN" in the last 168 hours. No results for input(s): "LIPASE", "AMYLASE" in the last 168 hours. No results for input(s): "AMMONIA" in the last 168 hours. Coagulation Profile: No results for input(s): "INR", "PROTIME" in the last 168 hours. Cardiac Enzymes: No results for input(s): "CKTOTAL", "CKMB", "CKMBINDEX", "TROPONINI" in the last 168 hours. BNP (last 3 results) No results for input(s): "PROBNP" in  the last 8760 hours. HbA1C: No results for input(s): "HGBA1C" in the last 72 hours. CBG: No results for input(s): "GLUCAP" in the last 168 hours. Lipid Profile: No results for input(s): "CHOL", "HDL", "LDLCALC", "TRIG", "CHOLHDL", "LDLDIRECT" in the last 72 hours. Thyroid Function Tests: No results for input(s): "TSH", "T4TOTAL", "FREET4", "T3FREE", "THYROIDAB" in the last 72 hours. Anemia Panel: No results for input(s): "VITAMINB12", "FOLATE", "FERRITIN", "TIBC", "IRON", "RETICCTPCT" in the last 72 hours. Most Recent Urinalysis On File:     Component Value Date/Time   COLORURINE YELLOW (A) 01/21/2024 1215   APPEARANCEUR HAZY (A) 01/21/2024 1215   APPEARANCEUR Clear 08/18/2014 1354   LABSPEC 1.023 01/21/2024 1215   LABSPEC 1.020 08/18/2014 1354   PHURINE 7.0 01/21/2024 1215   GLUCOSEU NEGATIVE 01/21/2024 1215   GLUCOSEU 50 mg/dL 16/09/9603 5409   HGBUR NEGATIVE 01/21/2024 1215   BILIRUBINUR NEGATIVE 01/21/2024 1215   BILIRUBINUR Negative 08/18/2014 1354   KETONESUR NEGATIVE 01/21/2024 1215   PROTEINUR NEGATIVE 01/21/2024 1215   NITRITE NEGATIVE 01/21/2024 1215   LEUKOCYTESUR MODERATE (A) 01/21/2024 1215   LEUKOCYTESUR Negative 08/18/2014 1354   Sepsis Labs: @LABRCNTIP (procalcitonin:4,lacticidven:4) Microbiology: No results found for this or any previous visit (from the past 240 hours).    Radiology Studies last 3 days: No results found.     Time spent: 50 min     Sunnie Nielsen, DO [facility] 02/01/2024,  3:48 PM    Dictation software may have been used to generate the above note. Typos may occur and escape review in typed/dictated notes. Please contact Dr Lyn Hollingshead directly for clarity if needed.  Staff may message me via secure chat in Epic  but this may not receive an immediate response,  please page me for urgent matters!  If 7PM-7AM, please contact night coverage [email or web address]

## 2024-02-01 NOTE — Plan of Care (Signed)
  Problem: Coping: Goal: Ability to adjust to condition or change in health will improve Outcome: Progressing   Problem: Fluid Volume: Goal: Ability to maintain a balanced intake and output will improve Outcome: Progressing   Problem: Health Behavior/Discharge Planning: Goal: Ability to identify and utilize available resources and services will improve Outcome: Progressing Goal: Ability to manage health-related needs will improve Outcome: Progressing   Problem: Metabolic: Goal: Ability to maintain appropriate glucose levels will improve Outcome: Progressing   Problem: Nutritional: Goal: Maintenance of adequate nutrition will improve Outcome: Progressing Goal: Progress toward achieving an optimal weight will improve Outcome: Progressing   Problem: Skin Integrity: Goal: Risk for impaired skin integrity will decrease Outcome: Progressing   Problem: Tissue Perfusion: Goal: Adequacy of tissue perfusion will improve Outcome: Progressing   Problem: Education: Goal: Knowledge of General Education information will improve Description: Including pain rating scale, medication(s)/side effects and non-pharmacologic comfort measures Outcome: Progressing   Problem: Health Behavior/Discharge Planning: Goal: Ability to manage health-related needs will improve Outcome: Progressing   Problem: Clinical Measurements: Goal: Ability to maintain clinical measurements within normal limits will improve Outcome: Progressing Goal: Will remain free from infection Outcome: Progressing Goal: Diagnostic test results will improve Outcome: Progressing Goal: Respiratory complications will improve Outcome: Progressing Goal: Cardiovascular complication will be avoided Outcome: Progressing   Problem: Activity: Goal: Risk for activity intolerance will decrease Outcome: Progressing   Problem: Nutrition: Goal: Adequate nutrition will be maintained Outcome: Progressing   Problem:  Elimination: Goal: Will not experience complications related to bowel motility Outcome: Progressing Goal: Will not experience complications related to urinary retention Outcome: Progressing   Problem: Pain Management: Goal: General experience of comfort will improve Outcome: Progressing   Problem: Safety: Goal: Ability to remain free from injury will improve Outcome: Progressing   Problem: Skin Integrity: Goal: Risk for impaired skin integrity will decrease Outcome: Progressing   Problem: Activity: Goal: Ability to tolerate increased activity will improve Outcome: Progressing   Problem: Respiratory: Goal: Ability to maintain a clear airway and adequate ventilation will improve Outcome: Progressing

## 2024-02-01 NOTE — Progress Notes (Signed)
Date of Admission:  12/08/2023     ID: Meghan Jarvis is a 48 y.o. female  Principal Problem:   Acute metabolic encephalopathy Active Problems:   Alcohol abuse   Acute hypoxic respiratory failure (HCC)   Pneumonia of right lower lobe due to infectious organism   Protein-calorie malnutrition, severe   Uncontrolled type 2 diabetes mellitus with hypoglycemia, without long-term current use of insulin (HCC)   Cocaine abuse (HCC)   AIDS (HCC)   Severe sepsis (HCC)   Chest pain   Pancytopenia (HCC)   Generalized weakness   Diarrhea   Right upper quadrant abdominal pain   Headache   Underweight (BMI < 18.5)   Hypotension   Abdominal pain   Encounter for psychiatric assessment   Rash due to allergy   Right knee pain   AKI (acute kidney injury) (HCC)  ? Meghan Jarvis is a 48 y.o. with a history of AIDS, not compliant with meds or visits ,  , cocaine use was brought in by EMS after being found unresponsive on the couch  in an aquaintance place-   Subjective: She is doing fine C/p rt hip popping and having pain rt leg Medications:   atovaquone  1,500 mg Oral Q breakfast   azithromycin  1,200 mg Oral Weekly   bictegravir-emtricitabine-tenofovir AF  1 tablet Oral Daily   docusate sodium  100 mg Oral BID   DULoxetine  30 mg Oral QHS   enoxaparin (LOVENOX) injection  40 mg Subcutaneous QHS   feeding supplement  237 mL Oral QID   folic acid  1 mg Oral Daily   hydrocerin   Topical BID   lidocaine  1 patch Transdermal Q24H   midodrine  10 mg Oral TID WC   multivitamin with minerals  1 tablet Oral QHS   pantoprazole  40 mg Oral BID   thiamine  100 mg Oral Daily    Objective: Vital signs in last 24 hours: Patient Vitals for the past 24 hrs:  BP Temp Temp src Pulse Resp SpO2  02/01/24 0912 101/66 98.1 F (36.7 C) Oral (!) 105 18 99 %  02/01/24 0435 97/61 98.1 F (36.7 C) Oral 98 18 100 %  01/31/24 2027 100/73 98 F (36.7 C) Oral 99 18 100 %  01/31/24 1552 (!) 106/93 (!)  97.5 F (36.4 C) Oral 81 16 96 %  Awake and alert Respond to questions appropriately But not much of insight into her condition Hss1s2 Lungs clear to auscultation Abdomen soft Skin- hyperpigmentation, dryness, scaling has improved significantly. CNS grossly non focal  Walking with limp on rt  Lab Results    Latest Ref Rng & Units 01/29/2024    5:36 AM 01/20/2024    5:08 AM 01/12/2024    5:11 AM  CBC  WBC 4.0 - 10.5 K/uL 5.2  6.4  7.4   Hemoglobin 12.0 - 15.0 g/dL 9.1  9.5  8.4   Hematocrit 36.0 - 46.0 % 28.6  30.6  27.0   Platelets 150 - 400 K/uL 263  279  263        Latest Ref Rng & Units 01/29/2024    5:36 AM 01/20/2024    5:08 AM 01/12/2024    5:11 AM  CMP  Glucose 70 - 99 mg/dL 80  96  92   BUN 6 - 20 mg/dL 28  40  27   Creatinine 0.44 - 1.00 mg/dL 1.19  1.47  8.29   Sodium 135 - 145  mmol/L 138  139  138   Potassium 3.5 - 5.1 mmol/L 4.2  4.3  4.1   Chloride 98 - 111 mmol/L 104  103  103   CO2 22 - 32 mmol/L 25  27  25    Calcium 8.9 - 10.3 mg/dL 9.8  9.5  9.2       Microbiology: 12/28 BC NG  Toxo IgG > 400 Toxo PCR neg Toxo igM neg RPR NR CMV DNA neg Crypto neg HIV RNA 2 million>> 34, 000 Cd4 is 14 ( 2.4%) repeat on 1/29 is 416 ( 23%) Beta D glucan > 500 (  repeated) Histoplasma neg Fungal antibodies negative Genosure prime- no resistant mutations QuantiFERON gold indeterminate.  No treatment needed now.  Can repeat it later.     Assessment/Plan: Encephalopathy secondary to cocaine use as resolved  Aspiration leading to acute hypoxic respiratory failure with right middle lobe and lower lobe consolidation.  Status post intubation and was mechanically ventilated in the early part of the admission.  Now has been extubated for many days Took treatment with Zosyn completed treatment repeat chest x-ray is clear      Rt sided  flank, chest pain-intermittent-- has resolved  CT abd ok- MRI lumbar spine deg changes- no acute findings other than degeneration  Rt  hip  and leg pain- may do MRI pelvis/hip to r/o avascualr necrosis because of  her comorbidity and life style at risk for avascular necrosis  Rash- resolved likely was due to bactrim- changed to Atovaquone- Mepron   H/o fall with recurrent ED presentation-      Pruritus with excoriations and hyperpigmentiaon Much better ? Bactrim related rash on basleine dry and excoriated skin    AIDS- non compliant with meds or visits to her Provider- not been in care since 2021- used to be followed at [facility] before Was on Biktarvy at one time and was non compliant then VL now is 2 million and cd4 is 11  Started Biktarvy on 12/14/23 . not sure how much compliance to be expected as her social situation is not conducive and so is her substance use- watch closely for Immune reconstitution inflammatory syndrome Genotype no resistance to NRTI, NNRTI, Integrase inhibitor and PI Repeat Vl shows a significant drop in 3 weeks from 2 million to 34,000 . Latest vl is 460 and CD4 is improved significantly from 11 to 413  RISK for IRIS- watch closely   Toxo IgG very high-  MRI brain no CNS lesions  . TOXO PCR negative On  PCP/TOXO  and MAI prophylaxis. Was On bactrim for PCP and Toxo prophylaxis- because of new rash concerning for bactrim allergy was changed to atovaquone Rash resolved  Beta D glucan high > 500 (fungal antibodies negative Just observe.    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 I will not be available until 02/20/24 If needed call [facility] physician covering me

## 2024-02-02 NOTE — Progress Notes (Signed)
 PROGRESS NOTE    Meghan Jarvis   OZH:[id number] DOB: 1976/08/08  DOA: 12/08/2023 Date of Service: 02/02/24 which is hospital day 61  PCP: [facility], [facility] course / significant events:   HPI: 48 year old female past medical history of alcohol abuse, cocaine abuse, homelessness, AIDS, atrial fibrillation, hypothyroidism, type 2 diabetes mellitus presented to [facility] with acute respiratory failure.  She was found down by a friend and could not wake her up.  She was found to be hypoxic in the 70s.   12/08/23: admitted to ICU intubated started on antibiotics for pneumonia.  Urine toxicology positive for cocaine.  12/31: extubated. ID consulted  12/12/2023: hospitalist pickup  1/2.  Patient complained of chest pain.  Cardiac enzymes negative x 2 and EKG ok. 1/3.  Patient complaining of abdominal pain. RUQ [place] negative. MRI of the brain did not show any toxoplasmosis lesions but did show advanced cerebral atrophy. 1/4.  Patient complaining of headache.  1/5.  Blood pressure this morning was low. Started on midodrine. 1/6.  Patient complaining of a lot of abdominal pain.  CT scan does not show any acute process and shows better aeration of right lower lung.  CT did show constipation.  Will start MiraLAX. 1/15.  Still do not have a TOC plan at this point of time.  TOC reached out to APS.  Patient complains of right knee pain.  Uric acid 4.4.  X-ray negative. 1/23: Patient refusing to answer orientation questions and becoming agitated.  Looks like does not have capacity to make decision but not cooperative for any proper evaluation. 1/28: Psych reevaluated her, patient has capacity to make medical decision but need assistance with housing and finances due to cognitive decline. 02/17: reassess by psychiatry "Patient is lacking and reasoning in her medical care for example she is unable to describe the consequence that she will have if she does not take her medications or has  complications with her chronic conditions." To 02/22: continues to complain intermittently every few days of leg pain, chest pain, knee pain - workup continues to be negative, pain controlled on Rx. Still no dispo plan.      Consultants:  Infectious disease   Procedures/Surgeries: none      ASSESSMENT & PLAN:   Acute metabolic encephalopathy-improved Mental status improved from admission but patient is not the best historian and does not capacity to make decisions at this time.  DSS referral by TOC.  MRI did not show any enhancing lesions but did show severe cerebral atrophy.  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 Mental status improved but still lacks capacity per psychiatry  Continue to monitor mental status TOC working on placement / DSS is involved - DSS in process of establishing guardianship and TOC has advised hospitalist team for NO discharge prior to DSS approval, uncertain if safe to go home w/ Fiancee    abdominal pain Patient intermittently complains of abdominal pain, no change in character.  Prior imaging is negative.  Patient having bowel movements.  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  Infectious disease following.  Placed on PCP and MAI prophylaxis with atovaquone and Zithromax.  Toxoplasma antibody IgG came back elevated and MRI of the brain does not show any enhancing lesions.  Infectious disease specialist started on Biktarvy.  Repeat viral load down to 34,300.  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  Platelet platelet count up in the normal range at 285, white blood cell count up in the normal range at 6.6, hemoglobin 8.1.   Protein-calorie malnutrition, severe Continue supplementation   Severe sepsis (HCC) Present on  admission with tachycardia, tachypnea, fever, pneumonia, acute respiratory failure and acute metabolic encephalopathy.  Patient completed antibiotics.   Cocaine abuse (HCC) Patient has been counseled on cessation of cocaine use   Pneumonia of right lower lobe due to infectious organism Completed 5 days of antibiotic.   Acute hypoxic respiratory failure (HCC) Resolved.  Patient extubated on 12/30.  Patient currently breathing on room air.   Alcohol abuse Continue thiamine multivitamin and folic acid   Uncontrolled type 2 diabetes mellitus with hypoglycemia, without long-term current use of insulin (HCC) Hemoglobin A1c 6.0.    Generalized weakness Continue PT OT   Chest pain Atypical in nature.  Troponin x 2 negative.  EKG did not show any ST elevations.  Repeat EKG on 1/17 did not show any ST elevations.   AKI (acute kidney injury) (HCC) Continue monitoring Encouraged on adequate hydration   Right knee pain Patient asked for Ace bandage. She has not been wearing this. Intermittently complains of knee pain. Right knee x-ray negative.  Uric acid 4.4.  Continue to monitor.   Underweight (BMI < 18.5) Last BMI 18.54   Diarrhea-resolved continue as needed Colace for any constipation  Rectal pain and scant BRBPR Mild hemorrhoid on exam Anusol cream prn   Borderline underweight based on BMI: Body mass index is 19.51 kg/m.  Underweight - under 18  overweight - 25 to 29 obese - 30 or more Class 1 obesity: BMI of 30.0 to 34 Class 2 obesity: BMI of 35.0 to 39 Class 3 obesity: BMI of 40.0 to 49 Super Morbid Obesity: BMI 50-59 Super-super Morbid Obesity: BMI 60+ Significantly low or high BMI is associated with higher medical risk.  Weight management advised as adjunct to other disease management and risk reduction treatments    DVT prophylaxis: lovenox  IV fluids: no continuous IV fluids  Nutrition: dysphagia Central lines / invasive devices: none  Code Status: FULL CODE   ACP documentation reviewed:  none on file in [facility]  TOC needs: placement Barriers to dispo / significant pending items: placement / DSS in process of establishing guardianship and TOC has advised no discharge prior to DSS approval, uncertain if safe to go home w/ [name]              Subjective / Brief ROS:  Patient reports no concerns No concerns from RN   Family Communication: fiancee is at bedside   Objective Findings:  Vitals:   02/01/24 1548 02/01/24 1942 02/02/24 0309 02/02/24 0839  BP: 96/73 98/65 92/64  93/71  Pulse: 85 85 100 100  Resp: 18 18 18 20   Temp: 97.6 F (36.4 C) 98.1 F (36.7 C) 98 F (36.7 C) 97.8 F (36.6 C)  TempSrc: Oral Oral  Oral  SpO2: 100% 96% 100% 100%  Weight:      Height:        Intake/Output Summary (Last 24 hours) at 02/02/2024 1317 Last data filed at 02/01/2024 1900 Gross per 24 hour  Intake 240 ml  Output --  Net 240 ml   Filed Weights   01/25/24 0500 01/26/24 0500 01/29/24 0451  Weight: 68.1 kg 65.6 kg (P) 65.3 kg    Examination:  Physical Exam Pulmonary:     Effort: Pulmonary effort is normal.  Neurological:     Mental Status: She is alert. Mental status is at baseline.  Psychiatric:        Mood and Affect: Mood normal.        Behavior: Behavior normal.          Scheduled Medications:   atovaquone  1,500 mg Oral Q breakfast   azithromycin  1,200 mg Oral Weekly   bictegravir-emtricitabine-tenofovir AF  1 tablet Oral Daily   docusate sodium  100 mg Oral BID   DULoxetine  30 mg Oral QHS   enoxaparin (LOVENOX) injection  40 mg Subcutaneous QHS   feeding supplement  237 mL Oral QID   folic acid  1 mg Oral Daily   hydrocerin   Topical BID   lidocaine  1 patch Transdermal Q24H   midodrine  10 mg Oral TID WC   multivitamin with minerals  1 tablet Oral QHS   pantoprazole  40 mg Oral BID   thiamine  100 mg Oral Daily    Continuous Infusions:   PRN Medications:  acetaminophen, cyclobenzaprine,  dicyclomine, diphenhydrAMINE, hydrocortisone, ipratropium-albuterol, lactulose, nicotine polacrilex, ondansetron, oxyCODONE, polyethylene glycol  Antimicrobials from admission:  Anti-infectives (From admission, onward)    Start     Dose/Rate Route Frequency Ordered Stop   01/25/24 0000  azithromycin (ZITHROMAX) 600 MG tablet        1,200 mg Oral Weekly 01/25/24 1610     01/25/24 0000  bictegravir-emtricitabine-tenofovir AF (BIKTARVY) 50-200-25 MG TABS tablet        1 tablet Oral Daily 01/25/24 1610     01/25/24 0000  atovaquone (MEPRON) 750 MG/5ML suspension        1,500 mg Oral Daily with breakfast 01/25/24 1610     12/25/23 0800  atovaquone (MEPRON) 750 MG/5ML suspension 1,500 mg        1,500 mg Oral Daily with breakfast 12/24/23 1558     12/24/23 1000  sulfamethoxazole-trimethoprim (BACTRIM) 400-80 MG per tablet 1 tablet  Status:  Discontinued        1 tablet Oral Daily 12/24/23 0749 12/24/23 1557   12/19/23 1400  azithromycin (ZITHROMAX) tablet 1,200 mg        1,200 mg Oral Weekly 12/18/23 1346     12/18/23 1000  sulfamethoxazole-trimethoprim (BACTRIM DS) 800-160 MG per tablet 1 tablet  Status:  Discontinued        1 tablet Oral Daily 12/17/23 1621 12/24/23 0748   12/14/23 1530  bictegravir-emtricitabine-tenofovir AF (BIKTARVY) 50-200-25 MG per tablet 1 tablet        1 tablet Oral Daily 12/14/23 1442     12/12/23 1000  amoxicillin-clavulanate (AUGMENTIN) 875-125 MG per tablet 1 tablet        1 tablet Oral Every 12 hours 12/11/23 1345 12/12/23 2131   12/12/23 1000  azithromycin (ZITHROMAX) tablet 1,250 mg  Status:  Discontinued        1,200 mg Oral Weekly 12/11/23 1618 12/18/23 1346   12/11/23 2000  sulfamethoxazole-trimethoprim (BACTRIM) 400-80 MG per tablet 1 tablet  Status:  Discontinued        1 tablet Oral Daily 12/11/23 1618 12/17/23 1621   12/09/23 2200  azithromycin (ZITHROMAX) 500 mg in sodium chloride 0.9 % 250 mL IVPB  Status:  Discontinued        500 mg 250 mL/hr over 60  Minutes Intravenous Every 24 hours 12/09/23 0734 12/11/23 1618   12/09/23 2000  cefTRIAXone (ROCEPHIN) 2  g in sodium chloride 0.9 % 100 mL IVPB  Status:  Discontinued        2 g 200 mL/hr over 30 Minutes Intravenous Every 24 hours 12/09/23 0953 12/09/23 1840   12/09/23 2000  piperacillin-tazobactam (ZOSYN) IVPB 3.375 g        3.375 g 12.5 mL/hr over 240 Minutes Intravenous Every 8 hours 12/09/23 1850 12/12/23 0037   12/09/23 0600  Ampicillin-Sulbactam (UNASYN) 3 g in sodium chloride 0.9 % 100 mL IVPB  Status:  Discontinued        3 g 200 mL/hr over 30 Minutes Intravenous Every 6 hours 12/09/23 0442 12/09/23 0953   12/08/23 2215  cefTRIAXone (ROCEPHIN) 2 g in sodium chloride 0.9 % 100 mL IVPB        2 g 200 mL/hr over 30 Minutes Intravenous Once 12/08/23 2214 12/08/23 2308   12/08/23 2215  azithromycin (ZITHROMAX) 500 mg in sodium chloride 0.9 % 250 mL IVPB        500 mg 250 mL/hr over 60 Minutes Intravenous  Once 12/08/23 2214 12/09/23 0000           Data Reviewed:  I have personally reviewed the following...  CBC: Recent Labs  Lab 01/29/24 0536  WBC 5.2  NEUTROABS 1.3*  HGB 9.1*  HCT 28.6*  MCV 86.1  PLT 263   Basic Metabolic Panel: Recent Labs  Lab 01/29/24 0536  NA 138  K 4.2  CL 104  CO2 25  GLUCOSE 80  BUN 28*  CREATININE 0.79  CALCIUM 9.8   GFR: Estimated Creatinine Clearance: 85.8 mL/min (by C-G formula based on SCr of 0.79 mg/dL). Liver Function Tests: No results for input(s): "AST", "ALT", "ALKPHOS", "BILITOT", "PROT", "ALBUMIN" in the last 168 hours. No results for input(s): "LIPASE", "AMYLASE" in the last 168 hours. No results for input(s): "AMMONIA" in the last 168 hours. Coagulation Profile: No results for input(s): "INR", "PROTIME" in the last 168 hours. Cardiac Enzymes: No results for input(s): "CKTOTAL", "CKMB", "CKMBINDEX", "TROPONINI" in the last 168 hours. BNP (last 3 results) No results for input(s): "PROBNP" in the last 8760  hours. HbA1C: No results for input(s): "HGBA1C" in the last 72 hours. CBG: No results for input(s): "GLUCAP" in the last 168 hours. Lipid Profile: No results for input(s): "CHOL", "HDL", "LDLCALC", "TRIG", "CHOLHDL", "LDLDIRECT" in the last 72 hours. Thyroid Function Tests: No results for input(s): "TSH", "T4TOTAL", "FREET4", "T3FREE", "THYROIDAB" in the last 72 hours. Anemia Panel: No results for input(s): "VITAMINB12", "FOLATE", "FERRITIN", "TIBC", "IRON", "RETICCTPCT" in the last 72 hours. Most Recent Urinalysis On File:     Component Value Date/Time   COLORURINE YELLOW (A) 01/21/2024 1215   APPEARANCEUR HAZY (A) 01/21/2024 1215   APPEARANCEUR Clear 08/18/2014 1354   LABSPEC 1.023 01/21/2024 1215   LABSPEC 1.020 08/18/2014 1354   PHURINE 7.0 01/21/2024 1215   GLUCOSEU NEGATIVE 01/21/2024 1215   GLUCOSEU 50 mg/dL 82/95/6213 0865   HGBUR NEGATIVE 01/21/2024 1215   BILIRUBINUR NEGATIVE 01/21/2024 1215   BILIRUBINUR Negative 08/18/2014 1354   KETONESUR NEGATIVE 01/21/2024 1215   PROTEINUR NEGATIVE 01/21/2024 1215   NITRITE NEGATIVE 01/21/2024 1215   LEUKOCYTESUR MODERATE (A) 01/21/2024 1215   LEUKOCYTESUR Negative 08/18/2014 1354   Sepsis Labs: @LABRCNTIP (procalcitonin:4,lacticidven:4) Microbiology: No results found for this or any previous visit (from the past 240 hours).    Radiology Studies last 3 days: No results found.     Time spent: 50 min     Sunnie Nielsen, DO [facility] 02/02/2024, 1:17 PM  Dictation software may have been used to generate the above note. Typos may occur and escape review in typed/dictated notes. Please contact Dr Lyn Hollingshead directly for clarity if needed.  Staff may message me via secure chat in Epic  but this may not receive an immediate response,  please page me for urgent matters!  If 7PM-7AM, please contact night coverage [email or web address]

## 2024-02-03 NOTE — Progress Notes (Signed)
 PROGRESS NOTE    Meghan Jarvis   AVW:[id number] DOB: [birth year]  DOA: 12/08/2023 Date of Service: 02/03/24 which is hospital day 34  PCP: [facility] course / significant events:   HPI: 48 year old female past medical history of alcohol abuse, cocaine abuse, homelessness, AIDS, atrial fibrillation, hypothyroidism, type 2 diabetes mellitus presented to [facility] with acute respiratory failure.  She was found down by a friend and could not wake her up.  She was found to be hypoxic in the 70s.   12/08/23: admitted to ICU intubated started on antibiotics for pneumonia.  Urine toxicology positive for cocaine.  12/31: extubated. ID consulted  12/12/2023: hospitalist pickup  1/2.  Patient complained of chest pain.  Cardiac enzymes negative x 2 and EKG ok. 1/3.  Patient complaining of abdominal pain. RUQ [place] negative. MRI of the brain did not show any toxoplasmosis lesions but did show advanced cerebral atrophy. 1/4.  Patient complaining of headache.  1/5.  Blood pressure this morning was low. Started on midodrine. 1/6.  Patient complaining of a lot of abdominal pain.  CT scan does not show any acute process and shows better aeration of right lower lung.  CT did show constipation.  Will start MiraLAX. 1/15.  Still do not have a TOC plan at this point of time.  TOC reached out to APS.  Patient complains of right knee pain.  Uric acid 4.4.  X-ray negative. 1/23: Patient refusing to answer orientation questions and becoming agitated.  Looks like does not have capacity to make decision but not cooperative for any proper evaluation. 1/28: Psych reevaluated her, patient has capacity to make medical decision but need assistance with housing and finances due to cognitive decline. 02/17: reassess by psychiatry "Patient is lacking and reasoning in her medical care for example she is unable to describe the consequence that she will have if she does not take her medications or has  complications with her chronic conditions." To 02/23: continues to complain intermittently every few days of leg pain, chest pain, knee pain - workup continues to be negative, pain controlled on Rx. Still no dispo plan.      Consultants:  Infectious disease   Procedures/Surgeries: none      ASSESSMENT & PLAN:   Acute metabolic encephalopathy-improved Mental status improved from admission but patient is not the best historian and does not capacity to make decisions at this time.  DSS referral by TOC.  MRI did not show any enhancing lesions but did show severe cerebral atrophy.  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 Mental status improved but still lacks capacity per psychiatry  Continue to monitor mental status TOC working on placement / DSS is involved - DSS in process of establishing guardianship and TOC has advised hospitalist team for NO discharge prior to DSS approval, uncertain if safe to go home w/ Fiancee    abdominal pain Patient intermittently complains of abdominal pain, no change in character.  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  Infectious disease following.  Placed on PCP and MAI prophylaxis with atovaquone and Zithromax.  Toxoplasma antibody IgG came back elevated and MRI of the brain does not show any enhancing lesions.  Infectious disease specialist started on Biktarvy.  Repeat viral load down to 34,300.  Infectious disease on board and case discussed   Headache-improved   Pancytopenia (HCC)-improved Secondary  to HIV.  Platelet platelet count up in the normal range at 285, white blood cell count up in the normal range at 6.6, hemoglobin 8.1.   Protein-calorie malnutrition, severe Continue supplementation   Severe sepsis (HCC) Present on admission with tachycardia, tachypnea, fever, pneumonia, acute respiratory failure and  acute metabolic encephalopathy.  Patient completed antibiotics.   Cocaine abuse (HCC) Patient has been counseled on cessation of cocaine use   Pneumonia of right lower lobe due to infectious organism Completed 5 days of antibiotic.   Acute hypoxic respiratory failure (HCC) Resolved.  Patient extubated on 12/30.  Patient currently breathing on room air.   Alcohol abuse Continue thiamine multivitamin and folic acid   Uncontrolled type 2 diabetes mellitus with hypoglycemia, without long-term current use of insulin (HCC) Hemoglobin A1c 6.0.    Generalized weakness Continue PT OT   Chest pain Atypical in nature.  Troponin x 2 negative.  EKG did not show any ST elevations.  Repeat EKG on 1/17 did not show any ST elevations.   AKI (acute kidney injury) (HCC) Continue monitoring Encouraged on adequate hydration   Right knee pain Patient asked for Ace bandage. She has not been wearing this. Intermittently complains of knee pain. Right knee x-ray negative.  Uric acid 4.4.  Continue to monitor.   Underweight (BMI < 18.5) Last BMI 18.54   Diarrhea-resolved continue as needed Colace for any constipation  Rectal pain and scant BRBPR Mild hemorrhoid on exam Anusol cream prn   Borderline underweight based on BMI: Body mass index is 19.51 kg/m.  Underweight - under 18  overweight - 25 to 29 obese - 30 or more Class 1 obesity: BMI of 30.0 to 34 Class 2 obesity: BMI of 35.0 to 39 Class 3 obesity: BMI of 40.0 to 49 Super Morbid Obesity: BMI 50-59 Super-super Morbid Obesity: BMI 60+ Significantly low or high BMI is associated with higher medical risk.  Weight management advised as adjunct to other disease management and risk reduction treatments    DVT prophylaxis: lovenox  IV fluids: no continuous IV fluids  Nutrition: dysphagia Central lines / invasive devices: none  Code Status: FULL CODE  ACP documentation reviewed:  none on file in [facility]  TOC needs: placement Barriers  to dispo / significant pending items: placement / DSS in process of establishing guardianship and TOC has advised no discharge prior to DSS approval, uncertain if safe to go home w/ Meghan Jarvis              Subjective / Brief ROS:  Patient reports no concerns No concerns from RN   Family Communication: fiancee is at bedside   Objective Findings:  Vitals:   02/03/24 0426 02/03/24 0429 02/03/24 0836 02/03/24 0927  BP: 105/66  96/69 102/66  Pulse: 93  (!) 102 (!) 101  Resp: 20  18   Temp: 98.7 F (37.1 C)  98.8 F (37.1 C)   TempSrc:   Oral   SpO2: 100%  100% 100%  Weight:  68.6 kg    Height:        Intake/Output Summary (Last 24 hours) at 02/03/2024 1329 Last data filed at 02/02/2024 1920 Gross per 24 hour  Intake 400 ml  Output --  Net 400 ml   Filed Weights   01/26/24 0500 01/29/24 0451 02/03/24 0429  Weight: 65.6 kg (P) 65.3 kg 68.6 kg    Examination:  Physical Exam Pulmonary:     Effort: Pulmonary effort is normal.  Neurological:  Mental Status: She is alert. Mental status is at baseline.  Psychiatric:        Mood and Affect: Mood normal.        Behavior: Behavior normal.          Scheduled Medications:   atovaquone  1,500 mg Oral Q breakfast   azithromycin  1,200 mg Oral Weekly   bictegravir-emtricitabine-tenofovir AF  1 tablet Oral Daily   docusate sodium  100 mg Oral BID   DULoxetine  30 mg Oral QHS   enoxaparin (LOVENOX) injection  40 mg Subcutaneous QHS   feeding supplement  237 mL Oral QID   folic acid  1 mg Oral Daily   hydrocerin   Topical BID   lidocaine  1 patch Transdermal Q24H   midodrine  10 mg Oral TID WC   multivitamin with minerals  1 tablet Oral QHS   pantoprazole  40 mg Oral BID   thiamine  100 mg Oral Daily    Continuous Infusions:   PRN Medications:  acetaminophen, cyclobenzaprine, dicyclomine, diphenhydrAMINE, hydrocortisone, ipratropium-albuterol, lactulose, nicotine polacrilex, ondansetron, oxyCODONE,  polyethylene glycol  Antimicrobials from admission:  Anti-infectives (From admission, onward)    Start     Dose/Rate Route Frequency Ordered Stop   01/25/24 0000  azithromycin (ZITHROMAX) 600 MG tablet        1,200 mg Oral Weekly 01/25/24 1610     01/25/24 0000  bictegravir-emtricitabine-tenofovir AF (BIKTARVY) 50-200-25 MG TABS tablet        1 tablet Oral Daily 01/25/24 1610     01/25/24 0000  atovaquone (MEPRON) 750 MG/5ML suspension        1,500 mg Oral Daily with breakfast 01/25/24 1610     12/25/23 0800  atovaquone (MEPRON) 750 MG/5ML suspension 1,500 mg        1,500 mg Oral Daily with breakfast 12/24/23 1558     12/24/23 1000  sulfamethoxazole-trimethoprim (BACTRIM) 400-80 MG per tablet 1 tablet  Status:  Discontinued        1 tablet Oral Daily 12/24/23 0749 12/24/23 1557   12/19/23 1400  azithromycin (ZITHROMAX) tablet 1,200 mg        1,200 mg Oral Weekly 12/18/23 1346     12/18/23 1000  sulfamethoxazole-trimethoprim (BACTRIM DS) 800-160 MG per tablet 1 tablet  Status:  Discontinued        1 tablet Oral Daily 12/17/23 1621 12/24/23 0748   12/14/23 1530  bictegravir-emtricitabine-tenofovir AF (BIKTARVY) 50-200-25 MG per tablet 1 tablet        1 tablet Oral Daily 12/14/23 1442     12/12/23 1000  amoxicillin-clavulanate (AUGMENTIN) 875-125 MG per tablet 1 tablet        1 tablet Oral Every 12 hours 12/11/23 1345 12/12/23 2131   12/12/23 1000  azithromycin (ZITHROMAX) tablet 1,250 mg  Status:  Discontinued        1,200 mg Oral Weekly 12/11/23 1618 12/18/23 1346   12/11/23 2000  sulfamethoxazole-trimethoprim (BACTRIM) 400-80 MG per tablet 1 tablet  Status:  Discontinued        1 tablet Oral Daily 12/11/23 1618 12/17/23 1621   12/09/23 2200  azithromycin (ZITHROMAX) 500 mg in sodium chloride 0.9 % 250 mL IVPB  Status:  Discontinued        500 mg 250 mL/hr over 60 Minutes Intravenous Every 24 hours 12/09/23 0734 12/11/23 1618   12/09/23 2000  cefTRIAXone (ROCEPHIN) 2 g in sodium  chloride 0.9 % 100 mL IVPB  Status:  Discontinued  2 g 200 mL/hr over 30 Minutes Intravenous Every 24 hours 12/09/23 0953 12/09/23 1840   12/09/23 2000  piperacillin-tazobactam (ZOSYN) IVPB 3.375 g        3.375 g 12.5 mL/hr over 240 Minutes Intravenous Every 8 hours 12/09/23 1850 12/12/23 0037   12/09/23 0600  Ampicillin-Sulbactam (UNASYN) 3 g in sodium chloride 0.9 % 100 mL IVPB  Status:  Discontinued        3 g 200 mL/hr over 30 Minutes Intravenous Every 6 hours 12/09/23 0442 12/09/23 0953   12/08/23 2215  cefTRIAXone (ROCEPHIN) 2 g in sodium chloride 0.9 % 100 mL IVPB        2 g 200 mL/hr over 30 Minutes Intravenous Once 12/08/23 2214 12/08/23 2308   12/08/23 2215  azithromycin (ZITHROMAX) 500 mg in sodium chloride 0.9 % 250 mL IVPB        500 mg 250 mL/hr over 60 Minutes Intravenous  Once 12/08/23 2214 12/09/23 0000           Data Reviewed:  I have personally reviewed the following...  CBC: Recent Labs  Lab 01/29/24 0536  WBC 5.2  NEUTROABS 1.3*  HGB 9.1*  HCT 28.6*  MCV 86.1  PLT 263   Basic Metabolic Panel: Recent Labs  Lab 01/29/24 0536  NA 138  K 4.2  CL 104  CO2 25  GLUCOSE 80  BUN 28*  CREATININE 0.79  CALCIUM 9.8   GFR: Estimated Creatinine Clearance: 85.8 mL/min (by C-G formula based on SCr of 0.79 mg/dL). Liver Function Tests: No results for input(s): "AST", "ALT", "ALKPHOS", "BILITOT", "PROT", "ALBUMIN" in the last 168 hours. No results for input(s): "LIPASE", "AMYLASE" in the last 168 hours. No results for input(s): "AMMONIA" in the last 168 hours. Coagulation Profile: No results for input(s): "INR", "PROTIME" in the last 168 hours. Cardiac Enzymes: No results for input(s): "CKTOTAL", "CKMB", "CKMBINDEX", "TROPONINI" in the last 168 hours. BNP (last 3 results) No results for input(s): "PROBNP" in the last 8760 hours. HbA1C: No results for input(s): "HGBA1C" in the last 72 hours. CBG: No results for input(s): "GLUCAP" in the last  168 hours. Lipid Profile: No results for input(s): "CHOL", "HDL", "LDLCALC", "TRIG", "CHOLHDL", "LDLDIRECT" in the last 72 hours. Thyroid Function Tests: No results for input(s): "TSH", "T4TOTAL", "FREET4", "T3FREE", "THYROIDAB" in the last 72 hours. Anemia Panel: No results for input(s): "VITAMINB12", "FOLATE", "FERRITIN", "TIBC", "IRON", "RETICCTPCT" in the last 72 hours. Most Recent Urinalysis On File:     Component Value Date/Time   COLORURINE YELLOW (A) 01/21/2024 1215   APPEARANCEUR HAZY (A) 01/21/2024 1215   APPEARANCEUR Clear 08/18/2014 1354   LABSPEC 1.023 01/21/2024 1215   LABSPEC 1.020 08/18/2014 1354   PHURINE 7.0 01/21/2024 1215   GLUCOSEU NEGATIVE 01/21/2024 1215   GLUCOSEU 50 mg/dL 78/29/5621 3086   HGBUR NEGATIVE 01/21/2024 1215   BILIRUBINUR NEGATIVE 01/21/2024 1215   BILIRUBINUR Negative 08/18/2014 1354   KETONESUR NEGATIVE 01/21/2024 1215   PROTEINUR NEGATIVE 01/21/2024 1215   NITRITE NEGATIVE 01/21/2024 1215   LEUKOCYTESUR MODERATE (A) 01/21/2024 1215   LEUKOCYTESUR Negative 08/18/2014 1354   Sepsis Labs: @LABRCNTIP (procalcitonin:4,lacticidven:4) Microbiology: No results found for this or any previous visit (from the past 240 hours).    Radiology Studies last 3 days: No results found.     Time spent: 50 min     Sunnie Nielsen, DO [facility] 02/03/2024, 1:29 PM    Dictation software may have been used to generate the above note. Typos may occur and escape review  in typed/dictated notes. Please contact Dr Lyn Hollingshead directly for clarity if needed.  Staff may message me via secure chat in Epic  but this may not receive an immediate response,  please page me for urgent matters!  If 7PM-7AM, please contact night coverage [email or web address]

## 2024-02-03 NOTE — Plan of Care (Signed)
  Problem: Coping: Goal: Ability to adjust to condition or change in health will improve Outcome: Progressing   Problem: Metabolic: Goal: Ability to maintain appropriate glucose levels will improve Outcome: Progressing   Problem: Nutritional: Goal: Progress toward achieving an optimal weight will improve Outcome: Progressing   Problem: Skin Integrity: Goal: Risk for impaired skin integrity will decrease Outcome: Progressing

## 2024-02-04 ENCOUNTER — Inpatient Hospital Stay: Payer: MEDICAID

## 2024-02-04 DIAGNOSIS — B20 Human immunodeficiency virus [HIV] disease: Secondary | ICD-10-CM | POA: Diagnosis not present

## 2024-02-04 DIAGNOSIS — R4182 Altered mental status, unspecified: Secondary | ICD-10-CM

## 2024-02-04 DIAGNOSIS — F1010 Alcohol abuse, uncomplicated: Secondary | ICD-10-CM | POA: Diagnosis not present

## 2024-02-04 DIAGNOSIS — F1410 Cocaine abuse, uncomplicated: Secondary | ICD-10-CM | POA: Diagnosis not present

## 2024-02-04 NOTE — Progress Notes (Addendum)
 PROGRESS NOTE    Meghan Jarvis   ZOX:[id number] DOB: 09/14/1976  DOA: 12/08/2023 Date of Service: 02/04/24 which is hospital day 13  PCP: [facility], [facility] course / significant events:   HPI: 48 year old female past medical history of alcohol abuse, cocaine abuse, homelessness, AIDS, atrial fibrillation, hypothyroidism, type 2 diabetes mellitus presented to [facility] with acute respiratory failure.  She was found down by a friend and could not wake her up.  She was found to be hypoxic in the 70s.   12/08/23: admitted to ICU intubated started on antibiotics for pneumonia.  Urine toxicology positive for cocaine.  12/31: extubated. ID consulted  12/12/2023: hospitalist pickup  1/2.  Patient complained of chest pain.  Cardiac enzymes negative x 2 and EKG ok. 1/3.  Patient complaining of abdominal pain. RUQ [place] negative. MRI of the brain did not show any toxoplasmosis lesions but did show advanced cerebral atrophy. 1/4.  Patient complaining of headache.  1/5.  Blood pressure this morning was low. Started on midodrine. 1/6.  Patient complaining of a lot of abdominal pain.  CT scan does not show any acute process and shows better aeration of right lower lung.  CT did show constipation.  Will start MiraLAX. 1/15.  Still do not have a TOC plan at this point of time.  TOC reached out to APS.  Patient complains of right knee pain.  Uric acid 4.4.  X-ray negative. 1/23: Patient refusing to answer orientation questions and becoming agitated.  Looks like does not have capacity to make decision but not cooperative for any proper evaluation. 1/28: Psych reevaluated her, patient has capacity to make medical decision but need assistance with housing and finances due to cognitive decline. 02/17: reassess by psychiatry "Patient is lacking and reasoning in her medical care for example she is unable to describe the consequence that she will have if she does not take her medications or has  complications with her chronic conditions." To 02/24: continues to complain intermittently every few days of leg pain, chest pain, knee pain - workup continues to be negative, pain controlled on Rx. Still no dispo plan.      Consultants:  Infectious disease   Procedures/Surgeries: none      ASSESSMENT & PLAN:   Acute metabolic encephalopathy-improved Mental status improved from admission but patient is not the best historian and does not capacity to make decisions at this time.  DSS referral by TOC.  MRI did not show any enhancing lesions but did show severe cerebral atrophy.  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 Mental status improved but still lacks capacity per psychiatry  Continue to monitor mental status TOC working on placement / DSS is involved - DSS in process of establishing guardianship and TOC has advised hospitalist team for NO discharge prior to DSS approval, uncertain if safe to go home w/ Fiancee    Hip and lower back pain Exam is nonsepcific, pt states all maneuvers cause pain Will XR hip and l spine   abdominal pain Patient intermittently complains of abdominal pain, no change in character.  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  Infectious disease following.  Placed on PCP and MAI prophylaxis with atovaquone and Zithromax.  Toxoplasma antibody IgG came back elevated and MRI of the brain does not show any enhancing lesions.  Infectious disease specialist started on Biktarvy.  Repeat viral load down to 34,300.  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  Platelet platelet count up in the normal range at 285, white blood cell count up in the normal range at 6.6, hemoglobin 8.1.   Protein-calorie malnutrition, severe Continue supplementation   Severe sepsis  (HCC) Present on admission with tachycardia, tachypnea, fever, pneumonia, acute respiratory failure and acute metabolic encephalopathy.  Patient completed antibiotics.   Cocaine abuse (HCC) Patient has been counseled on cessation of cocaine use   Pneumonia of right lower lobe due to infectious organism Completed 5 days of antibiotic.   Acute hypoxic respiratory failure (HCC) Resolved.  Patient extubated on 12/30.  Patient currently breathing on room air.   Alcohol abuse Continue thiamine multivitamin and folic acid   Uncontrolled type 2 diabetes mellitus with hypoglycemia, without long-term current use of insulin (HCC) Hemoglobin A1c 6.0.    Generalized weakness Continue PT OT   Chest pain Atypical in nature.  Troponin x 2 negative.  EKG did not show any ST elevations.  Repeat EKG on 1/17 did not show any ST elevations.   AKI (acute kidney injury) (HCC) Continue monitoring Encouraged on adequate hydration   Right knee pain Patient asked for Ace bandage. She has not been wearing this. Intermittently complains of knee pain. Right knee x-ray negative.  Uric acid 4.4.  Continue to monitor.   Underweight (BMI < 18.5) Last BMI 18.54   Diarrhea-resolved continue as needed Colace for any constipation  Rectal pain and scant BRBPR Mild hemorrhoid on exam Anusol cream prn   Borderline underweight based on BMI: Body mass index is 19.51 kg/m.  Underweight - under 18  overweight - 25 to 29 obese - 30 or more Class 1 obesity: BMI of 30.0 to 34 Class 2 obesity: BMI of 35.0 to 39 Class 3 obesity: BMI of 40.0 to 49 Super Morbid Obesity: BMI 50-59 Super-super Morbid Obesity: BMI 60+ Significantly low or high BMI is associated with higher medical risk.  Weight management advised as adjunct to other disease management and risk reduction treatments    DVT prophylaxis: lovenox  IV fluids: no continuous IV fluids  Nutrition: dysphagia Central lines / invasive devices: none  Code  Status: FULL CODE  ACP documentation reviewed:  none on file in [facility]  TOC needs: placement Barriers to dispo / significant pending items: placement / DSS in process of establishing guardianship and TOC has advised no discharge prior to DSS approval, uncertain if safe to go home w/ [name]              Subjective / Brief ROS:  Patient reports no concerns No concerns from RN   Family Communication: fiancee is at bedside   Objective Findings:  Vitals:   02/03/24 1537 02/03/24 2035 02/04/24 0446 02/04/24 0847  BP: 101/73 111/76 111/79 99/64  Pulse: (!) 104 96 (!) 101 (!) 106  Resp: 18 20 20 18   Temp: 98 F (36.7 C) 98.4 F (36.9 C) 97.9 F (36.6 C) 98.2 F (36.8 C)  TempSrc:  Oral Oral Oral  SpO2: 100% 98% 98% 100%  Weight:      Height:        Intake/Output Summary (Last 24 hours) at 02/04/2024 1655 Last data filed at 02/04/2024 1500 Gross per 24 hour  Intake 480 ml  Output --  Net 480 ml   Filed Weights   01/26/24 0500 01/29/24 0451 02/03/24 0429  Weight: 65.6 kg (  P) 65.3 kg 68.6 kg    Examination:  Physical Exam Constitutional:      General: She is not in acute distress. Cardiovascular:     Rate and Rhythm: Normal rate and regular rhythm.  Pulmonary:     Effort: Pulmonary effort is normal.  Abdominal:     General: Abdomen is flat. Bowel sounds are normal.     Palpations: Abdomen is soft.  Musculoskeletal:     Right lower leg: No edema.     Comments: Log roll positive w/ int/ext rot LLE, SLR pos in that pt reports pain "everywhere" with this, I ask if she feels hip pain with SLR and she says yes, I ask if she feels knee pain with SLR and she says yes, I ask if she feels abdominal pain with SLR and she says yes, she does not volunteer LBP w/ SLR  Neurological:     Mental Status: She is alert. Mental status is at baseline.          Scheduled Medications:   atovaquone  1,500 mg Oral Q breakfast   azithromycin  1,200 mg Oral Weekly    bictegravir-emtricitabine-tenofovir AF  1 tablet Oral Daily   docusate sodium  100 mg Oral BID   DULoxetine  30 mg Oral QHS   enoxaparin (LOVENOX) injection  40 mg Subcutaneous QHS   feeding supplement  237 mL Oral QID   folic acid  1 mg Oral Daily   hydrocerin   Topical BID   lidocaine  1 patch Transdermal Q24H   midodrine  10 mg Oral TID WC   multivitamin with minerals  1 tablet Oral QHS   pantoprazole  40 mg Oral BID   thiamine  100 mg Oral Daily    Continuous Infusions:   PRN Medications:  acetaminophen, cyclobenzaprine, dicyclomine, diphenhydrAMINE, hydrocortisone, ipratropium-albuterol, lactulose, nicotine polacrilex, ondansetron, oxyCODONE, polyethylene glycol  Antimicrobials from admission:  Anti-infectives (From admission, onward)    Start     Dose/Rate Route Frequency Ordered Stop   01/25/24 0000  azithromycin (ZITHROMAX) 600 MG tablet        1,200 mg Oral Weekly 01/25/24 1610     01/25/24 0000  bictegravir-emtricitabine-tenofovir AF (BIKTARVY) 50-200-25 MG TABS tablet        1 tablet Oral Daily 01/25/24 1610     01/25/24 0000  atovaquone (MEPRON) 750 MG/5ML suspension        1,500 mg Oral Daily with breakfast 01/25/24 1610     12/25/23 0800  atovaquone (MEPRON) 750 MG/5ML suspension 1,500 mg        1,500 mg Oral Daily with breakfast 12/24/23 1558     12/24/23 1000  sulfamethoxazole-trimethoprim (BACTRIM) 400-80 MG per tablet 1 tablet  Status:  Discontinued        1 tablet Oral Daily 12/24/23 0749 12/24/23 1557   12/19/23 1400  azithromycin (ZITHROMAX) tablet 1,200 mg        1,200 mg Oral Weekly 12/18/23 1346     12/18/23 1000  sulfamethoxazole-trimethoprim (BACTRIM DS) 800-160 MG per tablet 1 tablet  Status:  Discontinued        1 tablet Oral Daily 12/17/23 1621 12/24/23 0748   12/14/23 1530  bictegravir-emtricitabine-tenofovir AF (BIKTARVY) 50-200-25 MG per tablet 1 tablet        1 tablet Oral Daily 12/14/23 1442     12/12/23 1000  amoxicillin-clavulanate  (AUGMENTIN) 875-125 MG per tablet 1 tablet        1 tablet Oral Every 12 hours 12/11/23 1345  12/12/23 2131   12/12/23 1000  azithromycin (ZITHROMAX) tablet 1,250 mg  Status:  Discontinued        1,200 mg Oral Weekly 12/11/23 1618 12/18/23 1346   12/11/23 2000  sulfamethoxazole-trimethoprim (BACTRIM) 400-80 MG per tablet 1 tablet  Status:  Discontinued        1 tablet Oral Daily 12/11/23 1618 12/17/23 1621   12/09/23 2200  azithromycin (ZITHROMAX) 500 mg in sodium chloride 0.9 % 250 mL IVPB  Status:  Discontinued        500 mg 250 mL/hr over 60 Minutes Intravenous Every 24 hours 12/09/23 0734 12/11/23 1618   12/09/23 2000  cefTRIAXone (ROCEPHIN) 2 g in sodium chloride 0.9 % 100 mL IVPB  Status:  Discontinued        2 g 200 mL/hr over 30 Minutes Intravenous Every 24 hours 12/09/23 0953 12/09/23 1840   12/09/23 2000  piperacillin-tazobactam (ZOSYN) IVPB 3.375 g        3.375 g 12.5 mL/hr over 240 Minutes Intravenous Every 8 hours 12/09/23 1850 12/12/23 0037   12/09/23 0600  Ampicillin-Sulbactam (UNASYN) 3 g in sodium chloride 0.9 % 100 mL IVPB  Status:  Discontinued        3 g 200 mL/hr over 30 Minutes Intravenous Every 6 hours 12/09/23 0442 12/09/23 0953   12/08/23 2215  cefTRIAXone (ROCEPHIN) 2 g in sodium chloride 0.9 % 100 mL IVPB        2 g 200 mL/hr over 30 Minutes Intravenous Once 12/08/23 2214 12/08/23 2308   12/08/23 2215  azithromycin (ZITHROMAX) 500 mg in sodium chloride 0.9 % 250 mL IVPB        500 mg 250 mL/hr over 60 Minutes Intravenous  Once 12/08/23 2214 12/09/23 0000           Data Reviewed:  I have personally reviewed the following...  CBC: Recent Labs  Lab 01/29/24 0536  WBC 5.2  NEUTROABS 1.3*  HGB 9.1*  HCT 28.6*  MCV 86.1  PLT 263   Basic Metabolic Panel: Recent Labs  Lab 01/29/24 0536  NA 138  K 4.2  CL 104  CO2 25  GLUCOSE 80  BUN 28*  CREATININE 0.79  CALCIUM 9.8   GFR: Estimated Creatinine Clearance: 85.8 mL/min (by C-G formula based  on SCr of 0.79 mg/dL). Liver Function Tests: No results for input(s): "AST", "ALT", "ALKPHOS", "BILITOT", "PROT", "ALBUMIN" in the last 168 hours. No results for input(s): "LIPASE", "AMYLASE" in the last 168 hours. No results for input(s): "AMMONIA" in the last 168 hours. Coagulation Profile: No results for input(s): "INR", "PROTIME" in the last 168 hours. Cardiac Enzymes: No results for input(s): "CKTOTAL", "CKMB", "CKMBINDEX", "TROPONINI" in the last 168 hours. BNP (last 3 results) No results for input(s): "PROBNP" in the last 8760 hours. HbA1C: No results for input(s): "HGBA1C" in the last 72 hours. CBG: No results for input(s): "GLUCAP" in the last 168 hours. Lipid Profile: No results for input(s): "CHOL", "HDL", "LDLCALC", "TRIG", "CHOLHDL", "LDLDIRECT" in the last 72 hours. Thyroid Function Tests: No results for input(s): "TSH", "T4TOTAL", "FREET4", "T3FREE", "THYROIDAB" in the last 72 hours. Anemia Panel: No results for input(s): "VITAMINB12", "FOLATE", "FERRITIN", "TIBC", "IRON", "RETICCTPCT" in the last 72 hours. Most Recent Urinalysis On File:     Component Value Date/Time   COLORURINE YELLOW (A) 01/21/2024 1215   APPEARANCEUR HAZY (A) 01/21/2024 1215   APPEARANCEUR Clear 08/18/2014 1354   LABSPEC 1.023 01/21/2024 1215   LABSPEC 1.020 08/18/2014 1354   PHURINE 7.0 01/21/2024  1215   GLUCOSEU NEGATIVE 01/21/2024 1215   GLUCOSEU 50 mg/dL 30/86/5784 6962   HGBUR NEGATIVE 01/21/2024 1215   BILIRUBINUR NEGATIVE 01/21/2024 1215   BILIRUBINUR Negative 08/18/2014 1354   KETONESUR NEGATIVE 01/21/2024 1215   PROTEINUR NEGATIVE 01/21/2024 1215   NITRITE NEGATIVE 01/21/2024 1215   LEUKOCYTESUR MODERATE (A) 01/21/2024 1215   LEUKOCYTESUR Negative 08/18/2014 1354   Sepsis Labs: @LABRCNTIP (procalcitonin:4,lacticidven:4) Microbiology: No results found for this or any previous visit (from the past 240 hours).    Radiology Studies last 3 days: No results found.     Time  spent: 50 min     Sunnie Nielsen, DO [facility] 02/04/2024, 4:55 PM    Dictation software may have been used to generate the above note. Typos may occur and escape review in typed/dictated notes. Please contact Dr Lyn Hollingshead directly for clarity if needed.  Staff may message me via secure chat in Epic  but this may not receive an immediate response,  please page me for urgent matters!  If 7PM-7AM, please contact night coverage [email or web address]

## 2024-02-04 NOTE — Progress Notes (Signed)
 Mobility Specialist - Progress Note   02/04/24 1400  Mobility  Activity Ambulated independently in hallway  Level of Assistance Independent  Assistive Device Front wheel walker  Distance Ambulated (ft) 160 ft  Activity Response Tolerated well  Mobility visit 1 Mobility     Pt sitting EOB on arrival, utilizing RA. Pt agreeable to activity. Voices pain in RLE. Does occasionally lift RW on 2 wheels during turns, but no LOB. Pt returned to bed with alarm set, needs in reach.    Filiberto Pinks Mobility Specialist 02/04/24, 2:47 PM

## 2024-02-05 DIAGNOSIS — F1010 Alcohol abuse, uncomplicated: Secondary | ICD-10-CM | POA: Diagnosis not present

## 2024-02-05 DIAGNOSIS — B20 Human immunodeficiency virus [HIV] disease: Secondary | ICD-10-CM | POA: Diagnosis not present

## 2024-02-05 DIAGNOSIS — R4182 Altered mental status, unspecified: Secondary | ICD-10-CM | POA: Diagnosis not present

## 2024-02-05 DIAGNOSIS — F1410 Cocaine abuse, uncomplicated: Secondary | ICD-10-CM | POA: Diagnosis not present

## 2024-02-05 LAB — BASIC METABOLIC PANEL
Anion gap: 8 (ref 5–15)
BUN: 32 mg/dL — ABNORMAL HIGH (ref 6–20)
CO2: 24 mmol/L (ref 22–32)
Calcium: 9.7 mg/dL (ref 8.9–10.3)
Chloride: 105 mmol/L (ref 98–111)
Creatinine, Ser: 0.75 mg/dL (ref 0.44–1.00)
GFR, Estimated: >60 mL/min (ref >60)
Glucose, Bld: 86 mg/dL (ref 70–99)
Potassium: 4.3 mmol/L (ref 3.5–5.1)
Sodium: 137 mmol/L (ref 135–145)

## 2024-02-05 LAB — CBC
HCT: 28.6 % — ABNORMAL LOW (ref 36.0–46.0)
Hemoglobin: 9.1 g/dL — ABNORMAL LOW (ref 12.0–15.0)
MCH: 27.2 pg (ref 26.0–34.0)
MCHC: 31.8 g/dL (ref 30.0–36.0)
MCV: 85.6 fL (ref 80.0–100.0)
Platelets: 242 10*3/uL (ref 150–400)
RBC: 3.34 MIL/uL — ABNORMAL LOW (ref 3.87–5.11)
RDW: 15.9 % — ABNORMAL HIGH (ref 11.5–15.5)
WBC: 5.2 10*3/uL (ref 4.0–10.5)
nRBC: 0 % (ref 0.0–0.2)

## 2024-02-05 NOTE — Progress Notes (Signed)
 PROGRESS NOTE    Meghan Jarvis   UJW:[id number] DOB: 05-29-1976  DOA: 12/08/2023 Date of Service: 02/05/24 which is hospital day 63  PCP: [facility] course / significant events:   HPI: 48 year old female past medical history of alcohol abuse, cocaine abuse, homelessness, AIDS, atrial fibrillation, hypothyroidism, type 2 diabetes mellitus presented to [facility] with acute respiratory failure.  She was found down by a friend and could not wake her up.  She was found to be hypoxic in the 70s.   12/08/23: admitted to ICU intubated started on antibiotics for pneumonia.  Urine toxicology positive for cocaine.  12/31: extubated. ID consulted  12/12/2023: hospitalist pickup  1/2.  Patient complained of chest pain.  Cardiac enzymes negative x 2 and EKG ok. 1/3.  Patient complaining of abdominal pain. RUQ [place] negative. MRI of the brain did not show any toxoplasmosis lesions but did show advanced cerebral atrophy. 1/4.  Patient complaining of headache.  1/5.  Blood pressure this morning was low. Started on midodrine. 1/6.  Patient complaining of a lot of abdominal pain.  CT scan does not show any acute process and shows better aeration of right lower lung.  CT did show constipation.  Will start MiraLAX. 1/15.  Still do not have a TOC plan at this point of time.  TOC reached out to APS.  Patient complains of right knee pain.  Uric acid 4.4.  X-ray negative. 1/23: Patient refusing to answer orientation questions and becoming agitated.  Looks like does not have capacity to make decision but not cooperative for any proper evaluation. 1/28: Psych reevaluated her, patient has capacity to make medical decision but need assistance with housing and finances due to cognitive decline. 02/17: reassess by psychiatry "Patient is lacking and reasoning in her medical care for example she is unable to describe the consequence that she will have if she does not take her medications or has  complications with her chronic conditions." To 02/24: continues to complain intermittently every few days of leg pain, chest pain, knee pain - workup continues to be negative, pain controlled on Rx. Still no dispo plan.  02/25: pt reports "unable to walk" after bumping hip on doorknob, see progress note, nonacute exam and neg imaging.      Consultants:  Infectious disease   Procedures/Surgeries: none      ASSESSMENT & PLAN:   Acute metabolic encephalopathy-improved Mental status improved from admission but patient is not the best historian and does not capacity to make decisions at this time.  DSS referral by TOC.  MRI did not show any enhancing lesions but did show severe cerebral atrophy.  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 Mental status improved but still lacks capacity per psychiatry  Continue to monitor mental status TOC working on placement / DSS is involved - DSS in process of establishing guardianship and TOC has advised hospitalist team for NO discharge prior to DSS approval, uncertain if safe to go home w/ Fiancee    Hip and lower back pain Exam is nonsepcific, pt states all maneuvers cause pain Neg XR hip and L spine  PT/OT   abdominal pain Patient intermittently complains of abdominal pain, no change in character.  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  Infectious disease following.  Placed on PCP and MAI prophylaxis with atovaquone and Zithromax.  Toxoplasma antibody IgG  came back elevated and MRI of the brain does not show any enhancing lesions.  Infectious disease specialist started on Biktarvy.  Repeat viral load down to 34,300.  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  Platelet platelet count up in the normal range at 285, white blood cell count up in  the normal range at 6.6, hemoglobin 8.1.   Protein-calorie malnutrition, severe Continue supplementation   Severe sepsis (HCC) Present on admission with tachycardia, tachypnea, fever, pneumonia, acute respiratory failure and acute metabolic encephalopathy.  Patient completed antibiotics.   Cocaine abuse (HCC) Patient has been counseled on cessation of cocaine use   Pneumonia of right lower lobe due to infectious organism Completed 5 days of antibiotic.   Acute hypoxic respiratory failure (HCC) Resolved.  Patient extubated on 12/30.  Patient currently breathing on room air.   Alcohol abuse Continue thiamine multivitamin and folic acid   Uncontrolled type 2 diabetes mellitus with hypoglycemia, without long-term current use of insulin (HCC) Hemoglobin A1c 6.0.    Generalized weakness Continue PT OT   Chest pain Atypical in nature.  Troponin x 2 negative.  EKG did not show any ST elevations.  Repeat EKG on 1/17 did not show any ST elevations.   AKI (acute kidney injury) (HCC) Continue monitoring Encouraged on adequate hydration   Right knee pain Patient asked for Ace bandage. She has not been wearing this. Intermittently complains of knee pain. Right knee x-ray negative.  Uric acid 4.4.  Continue to monitor.   Underweight (BMI < 18.5) Last BMI 18.54   Diarrhea-resolved continue as needed Colace for any constipation  Rectal pain and scant BRBPR Mild hemorrhoid on exam Anusol cream prn   Borderline underweight based on BMI: Body mass index is 19.51 kg/m.  Underweight - under 18  overweight - 25 to 29 obese - 30 or more Class 1 obesity: BMI of 30.0 to 34 Class 2 obesity: BMI of 35.0 to 39 Class 3 obesity: BMI of 40.0 to 49 Super Morbid Obesity: BMI 50-59 Super-super Morbid Obesity: BMI 60+ Significantly low or high BMI is associated with higher medical risk.  Weight management advised as adjunct to other disease management and risk reduction treatments    DVT  prophylaxis: lovenox  IV fluids: no continuous IV fluids  Nutrition: dysphagia Central lines / invasive devices: none  Code Status: FULL CODE  ACP documentation reviewed:  none on file in [facility]  TOC needs: placement Barriers to dispo / significant pending items: placement / DSS in process of establishing guardianship and TOC has advised no discharge prior to DSS approval, uncertain if safe to go home w/ [name]              Subjective / Brief ROS:  Patient reports no concerns today other than still sore in hip No concerns from RN   Family Communication: none at this time    Objective Findings:  Vitals:   02/04/24 2003 02/05/24 0353 02/05/24 0354 02/05/24 0752  BP: 98/70 94/62  97/65  Pulse: 100 97  99  Resp: 18 18  18   Temp: 98.3 F (36.8 C) 98.2 F (36.8 C)  98.6 F (37 C)  TempSrc:  Oral  Oral  SpO2: 100% 100%  98%  Weight:   68.6 kg   Height:        Intake/Output Summary (Last 24 hours) at 02/05/2024 1637 Last data filed at 02/05/2024 1017 Gross per 24 hour  Intake  240 ml  Output --  Net 240 ml   Filed Weights   01/29/24 0451 02/03/24 0429 02/05/24 0354  Weight: (P) 65.3 kg 68.6 kg 68.6 kg    Examination:  Physical Exam Constitutional:      General: She is not in acute distress. Cardiovascular:     Rate and Rhythm: Normal rate and regular rhythm.  Pulmonary:     Effort: Pulmonary effort is normal.  Abdominal:     General: Abdomen is flat. Bowel sounds are normal.     Palpations: Abdomen is soft.  Musculoskeletal:     Right lower leg: No edema.     Left lower leg: No edema.  Neurological:     Mental Status: She is alert. Mental status is at baseline.          Scheduled Medications:   atovaquone  1,500 mg Oral Q breakfast   azithromycin  1,200 mg Oral Weekly   bictegravir-emtricitabine-tenofovir AF  1 tablet Oral Daily   docusate sodium  100 mg Oral BID   DULoxetine  30 mg Oral QHS   enoxaparin (LOVENOX) injection  40 mg  Subcutaneous QHS   feeding supplement  237 mL Oral QID   folic acid  1 mg Oral Daily   hydrocerin   Topical BID   lidocaine  1 patch Transdermal Q24H   midodrine  10 mg Oral TID WC   multivitamin with minerals  1 tablet Oral QHS   pantoprazole  40 mg Oral BID   thiamine  100 mg Oral Daily    Continuous Infusions:   PRN Medications:  acetaminophen, cyclobenzaprine, dicyclomine, diphenhydrAMINE, hydrocortisone, ipratropium-albuterol, lactulose, nicotine polacrilex, ondansetron, oxyCODONE, polyethylene glycol  Antimicrobials from admission:  Anti-infectives (From admission, onward)    Start     Dose/Rate Route Frequency Ordered Stop   01/25/24 0000  azithromycin (ZITHROMAX) 600 MG tablet        1,200 mg Oral Weekly 01/25/24 1610     01/25/24 0000  bictegravir-emtricitabine-tenofovir AF (BIKTARVY) 50-200-25 MG TABS tablet        1 tablet Oral Daily 01/25/24 1610     01/25/24 0000  atovaquone (MEPRON) 750 MG/5ML suspension        1,500 mg Oral Daily with breakfast 01/25/24 1610     12/25/23 0800  atovaquone (MEPRON) 750 MG/5ML suspension 1,500 mg        1,500 mg Oral Daily with breakfast 12/24/23 1558     12/24/23 1000  sulfamethoxazole-trimethoprim (BACTRIM) 400-80 MG per tablet 1 tablet  Status:  Discontinued        1 tablet Oral Daily 12/24/23 0749 12/24/23 1557   12/19/23 1400  azithromycin (ZITHROMAX) tablet 1,200 mg        1,200 mg Oral Weekly 12/18/23 1346     12/18/23 1000  sulfamethoxazole-trimethoprim (BACTRIM DS) 800-160 MG per tablet 1 tablet  Status:  Discontinued        1 tablet Oral Daily 12/17/23 1621 12/24/23 0748   12/14/23 1530  bictegravir-emtricitabine-tenofovir AF (BIKTARVY) 50-200-25 MG per tablet 1 tablet        1 tablet Oral Daily 12/14/23 1442     12/12/23 1000  amoxicillin-clavulanate (AUGMENTIN) 875-125 MG per tablet 1 tablet        1 tablet Oral Every 12 hours 12/11/23 1345 12/12/23 2131   12/12/23 1000  azithromycin (ZITHROMAX) tablet 1,250 mg  Status:   Discontinued        1,200 mg Oral Weekly 12/11/23 1618 12/18/23 1346   12/11/23  2000  sulfamethoxazole-trimethoprim (BACTRIM) 400-80 MG per tablet 1 tablet  Status:  Discontinued        1 tablet Oral Daily 12/11/23 1618 12/17/23 1621   12/09/23 2200  azithromycin (ZITHROMAX) 500 mg in sodium chloride 0.9 % 250 mL IVPB  Status:  Discontinued        500 mg 250 mL/hr over 60 Minutes Intravenous Every 24 hours 12/09/23 0734 12/11/23 1618   12/09/23 2000  cefTRIAXone (ROCEPHIN) 2 g in sodium chloride 0.9 % 100 mL IVPB  Status:  Discontinued        2 g 200 mL/hr over 30 Minutes Intravenous Every 24 hours 12/09/23 0953 12/09/23 1840   12/09/23 2000  piperacillin-tazobactam (ZOSYN) IVPB 3.375 g        3.375 g 12.5 mL/hr over 240 Minutes Intravenous Every 8 hours 12/09/23 1850 12/12/23 0037   12/09/23 0600  Ampicillin-Sulbactam (UNASYN) 3 g in sodium chloride 0.9 % 100 mL IVPB  Status:  Discontinued        3 g 200 mL/hr over 30 Minutes Intravenous Every 6 hours 12/09/23 0442 12/09/23 0953   12/08/23 2215  cefTRIAXone (ROCEPHIN) 2 g in sodium chloride 0.9 % 100 mL IVPB        2 g 200 mL/hr over 30 Minutes Intravenous Once 12/08/23 2214 12/08/23 2308   12/08/23 2215  azithromycin (ZITHROMAX) 500 mg in sodium chloride 0.9 % 250 mL IVPB        500 mg 250 mL/hr over 60 Minutes Intravenous  Once 12/08/23 2214 12/09/23 0000           Data Reviewed:  I have personally reviewed the following...  CBC: Recent Labs  Lab 02/05/24 0552  WBC 5.2  HGB 9.1*  HCT 28.6*  MCV 85.6  PLT 242   Basic Metabolic Panel: Recent Labs  Lab 02/05/24 0552  NA 137  K 4.3  CL 105  CO2 24  GLUCOSE 86  BUN 32*  CREATININE 0.75  CALCIUM 9.7   GFR: Estimated Creatinine Clearance: 85.8 mL/min (by C-G formula based on SCr of 0.75 mg/dL). Liver Function Tests: No results for input(s): "AST", "ALT", "ALKPHOS", "BILITOT", "PROT", "ALBUMIN" in the last 168 hours. No results for input(s): "LIPASE", "AMYLASE"  in the last 168 hours. No results for input(s): "AMMONIA" in the last 168 hours. Coagulation Profile: No results for input(s): "INR", "PROTIME" in the last 168 hours. Cardiac Enzymes: No results for input(s): "CKTOTAL", "CKMB", "CKMBINDEX", "TROPONINI" in the last 168 hours. BNP (last 3 results) No results for input(s): "PROBNP" in the last 8760 hours. HbA1C: No results for input(s): "HGBA1C" in the last 72 hours. CBG: No results for input(s): "GLUCAP" in the last 168 hours. Lipid Profile: No results for input(s): "CHOL", "HDL", "LDLCALC", "TRIG", "CHOLHDL", "LDLDIRECT" in the last 72 hours. Thyroid Function Tests: No results for input(s): "TSH", "T4TOTAL", "FREET4", "T3FREE", "THYROIDAB" in the last 72 hours. Anemia Panel: No results for input(s): "VITAMINB12", "FOLATE", "FERRITIN", "TIBC", "IRON", "RETICCTPCT" in the last 72 hours. Most Recent Urinalysis On File:     Component Value Date/Time   COLORURINE YELLOW (A) 01/21/2024 1215   APPEARANCEUR HAZY (A) 01/21/2024 1215   APPEARANCEUR Clear 08/18/2014 1354   LABSPEC 1.023 01/21/2024 1215   LABSPEC 1.020 08/18/2014 1354   PHURINE 7.0 01/21/2024 1215   GLUCOSEU NEGATIVE 01/21/2024 1215   GLUCOSEU 50 mg/dL 16/09/9603 5409   HGBUR NEGATIVE 01/21/2024 1215   BILIRUBINUR NEGATIVE 01/21/2024 1215   BILIRUBINUR Negative 08/18/2014 1354   KETONESUR NEGATIVE 01/21/2024  1215   PROTEINUR NEGATIVE 01/21/2024 1215   NITRITE NEGATIVE 01/21/2024 1215   LEUKOCYTESUR MODERATE (A) 01/21/2024 1215   LEUKOCYTESUR Negative 08/18/2014 1354   Sepsis Labs: @LABRCNTIP (procalcitonin:4,lacticidven:4) Microbiology: No results found for this or any previous visit (from the past 240 hours).    Radiology Studies last 3 days: DG HIP UNILAT WITH PELVIS 2-3 VIEWS LEFT Result Date: 02/04/2024 CLINICAL DATA:  Right hip and lower back pain.  No known injury. EXAM: DG HIP (WITH OR WITHOUT PELVIS) 2-3V LEFT COMPARISON:  None Available. FINDINGS: There is no  evidence of hip fracture or dislocation. There is no evidence of arthropathy or other focal bone abnormality. IMPRESSION: No acute bony abnormalities. Electronically Signed   By: Burman Nieves M.D.   On: 02/04/2024 21:11   DG Lumbar Spine 2-3 Views Result Date: 02/04/2024 CLINICAL DATA:  Low back pain EXAM: LUMBAR SPINE - 2-3 VIEW COMPARISON:  Lumbar spine x-ray 07/12/2022 FINDINGS: There is no evidence of lumbar spine fracture. Alignment is normal. Intervertebral disc spaces are maintained. IMPRESSION: Negative. Electronically Signed   By: Darliss Cheney M.D.   On: 02/04/2024 21:11       Time spent: 50 min     Sunnie Nielsen, DO [facility] 02/05/2024, 4:37 PM    Dictation software may have been used to generate the above note. Typos may occur and escape review in typed/dictated notes. Please contact Dr Lyn Hollingshead directly for clarity if needed.  Staff may message me via secure chat in Epic  but this may not receive an immediate response,  please page me for urgent matters!  If 7PM-7AM, please contact night coverage [email or web address]

## 2024-02-06 DIAGNOSIS — G9341 Metabolic encephalopathy: Secondary | ICD-10-CM | POA: Diagnosis not present

## 2024-02-06 NOTE — Plan of Care (Signed)
   Problem: Coping: Goal: Ability to adjust to condition or change in health will improve Outcome: Progressing

## 2024-02-06 NOTE — Progress Notes (Signed)
 Progress Note   Patient: Meghan Jarvis ZOX:[id number] DOB: [birth year] DOA: 12/08/2023     60 DOS: the patient was seen and examined on 02/06/2024   Brief hospital course: 47yo with h/o polysubstance (ETOH, cocaine) abuse, afib, homelessness, AIDS, hypothyroidism, and DM who presented on 12/08/23 with acute respiratory failure.  She was found down by a friend and could not wake her up.  She was found to be hypoxic in the 70s. Admitted to ICU, intubated, started on antibiotics for pneumonia.  12/31: extubated. ID consulted.  MRI brain with advanced cerebral atrophy.  Psych evaluated - "Patient is lacking and reasoning in her medical care for example she is unable to describe the consequence that she will have if she does not take her medications or has complications with her chronic conditions" and needs assistance with housing and finances due to cognitive decline.  APS/DSS pending guardianship and will need placement.    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  Viral load 2 million, CD4 14 on admission Infectious disease following, started Biktarvy Placed on PCP and MAI prophylaxis with  atovaquone and Zithromax Toxoplasma antibody IgG came back elevated but MRI of the brain does not show any enhancing lesions Repeat viral load down to 34,300 Infectious disease on board and case discussed   Pancytopenia  Secondary to HIV Platelet count and WBC have normalized Hgb is stable at 9.1   Cocaine abuse  Patient has been counseled on cessation of cocaine use   Pneumonia of right lower lobe due to infectious organism Sepsis physiology on presentation, resolved Completed 5 days of antibiotic Associated acute hypoxic respiratory failure also resolved Stable on room air   Alcohol abuse Continue thiamine, multivitamin, and folic acid   Uncontrolled type 2 diabetes mellitus with hypoglycemia, without long-term current use of insulin  Hemoglobin A1c 6.0 Not on home medications Does not need to be actively monitored at this time Encourage carb modified dietary choices and exercise   Rectal pain and scant BRBPR Mild hemorrhoid on exam Anusol cream prn   Borderline underweight based on BMI Body mass index is 19.51 -> 22.97 Nutrition Problem: Severe Malnutrition Etiology: chronic illness (HIV, homelessness, substance abuse) Signs/Symptoms: percent weight loss, severe fat depletion, severe muscle depletion Percent weight loss: 35 %      Consultants: ID Psychiatry PT OT Nutrition TOC team  Procedures: None  Antibiotics: Unasyn x 1 dose Ceftriaxone x 1 dose Augmentin x 2 doses Zosyn x 7 doses Bactrim 12/31-1/13 Azithromycin x 3 doses and now weekly   30 Day Unplanned Readmission Risk Score    Flowsheet Row ED to Hosp-Admission (Current) from 12/08/2023 in [facility]  30 Day Unplanned Readmission Risk Score (%)  36.45 Filed at 02/06/2024 0801       This score is the patient's risk of an unplanned readmission within 30 days of being discharged (0 -100%). The score is based on dignosis, age, lab data, medications,  orders, and past utilization.   Low:  0-14.9   Medium: 15-21.9   High: 22-29.9   Extreme: 30 and above           Subjective: No specific complaints.  Accompanied by her boyfriend.   Objective: Vitals:   02/06/24 0745 02/06/24 0812  BP: 115/80 96/66  Pulse: (!) 119 98  Resp: 18 19  Temp: 98.6 F (37 C) 98.5 F (36.9 C)  SpO2: 99% 100%    Intake/Output Summary (Last 24 hours) at 02/06/2024 0851 Last data filed at 02/05/2024 1017 Gross per 24 hour  Intake 240 ml  Output --  Net 240 ml   Filed Weights   02/03/24 0429 02/05/24 0354 02/06/24 0400  Weight: 68.6 kg 68.6 kg 68.5 kg    Exam:  General:  Appears calm and comfortable and is in NAD, sitting on the side of the bed Eyes:   EOMI, normal lids, iris ENT:  grossly normal hearing, lips & tongue, mmm Neck:  no LAD, masses or thyromegaly Cardiovascular:  RRR, no m/r/g. No LE edema.  Respiratory:   CTA bilaterally with no wheezes/rales/rhonchi.  Normal respiratory effort. Abdomen:  soft, NT, ND Skin:  no rash or induration seen on limited exam Musculoskeletal:  grossly normal tone BUE/BLE, good ROM, no bony abnormality Psychiatric:  blunted mood and affect, speech fluent and appropriate, AOx3 Neurologic:  CN 2-12 grossly intact, moves all extremities in coordinated fashion  Data Reviewed: I have reviewed the patient's lab results since admission.  Pertinent labs for today include:   Stable on 2/25     Family Communication: Boyfriend was present throughout evaluation  Disposition: Status is: Inpatient Remains inpatient appropriate because: unsafe disposition     Time spent: 35 minutes  Unresulted Labs (From admission, onward)     Start     Ordered   01/02/24 0500  Miscellaneous [facility] test (send-out)  Tomorrow morning,   R       Question:  Test name / description:  Answer:  [id number] Acid-fast (Mycobacteria) Smear and Culture With Reflex to Identification and Susceptibility Testing   01/01/24 1415    12/31/23 1134  Fungus culture, blood  Once,   R       Question:  Patient immune status  Answer:  Immunocompromised   12/31/23 1133             Author: Jonah Blue, MD 02/06/2024 8:51 AM  For on call review [email or web address].

## 2024-02-06 NOTE — Evaluation (Signed)
 Physical Therapy ReEvaluation Patient Details Name: Meghan Jarvis MRN: [id number] DOB: 03-15-1976 Today's Date: 02/06/2024  History of Present Illness  Pt is a 48 year old female admitted with [facility] with acute hypoxic respiratory failure, severe sepsis, community acquired PNA acute metabolic encephalopathy; She was intubated, extubated 12/30    PMH significant for alcohol abuse, cocaine abuse, homelessness, AIDS, atrial fibrillation, hypothyroidism, type 2 diabetes mellitus.  Re-eval 2/26 for "new onset" R hip pain.  Clinical Impression  Pt pleasant and ultimately safe and able to ambulate the full loop with relatively consistent cadence using walker.  She did not display excessive limp or hesitancy with R WBing but subjectively c/o pain the the effort.  Pt reports R "hip" has been hurting for "a month" and was inconsistent about where the worst of the pain was (groin, laterally, back/flank) but was able to do some WBing through it w/o UE/AD, though PT encouraged continued use of walker.  PT likely does not need a lot of further PT as she is moving nearly independently and  w/o overt limp or safety issues.  Will maintain on caseload for trail to address subacute R hip complains.      If plan is discharge home, recommend the following: Assistance with cooking/housework;Direct supervision/assist for financial management;Direct supervision/assist for medications management;Assist for transportation;Help with stairs or ramp for entrance;Supervision due to cognitive status;A little help with bathing/dressing/bathroom   Can travel by private vehicle   Yes    Equipment Recommendations Rolling walker (2 wheels);Rollator (4 wheels)  Recommendations for Other Services       Functional Status Assessment Patient has had a recent decline in their functional status and demonstrates the ability to make significant improvements in function in a reasonable and predictable amount of time.     Precautions /  Restrictions Precautions Precautions: Fall Restrictions Weight Bearing Restrictions Per Provider Order: No      Mobility  Bed Mobility Overal bed mobility: Modified Independent                  Transfers Overall transfer level: Modified independent Equipment used: None Transfers: Sit to/from Stand Sit to Stand: Supervision Stand pivot transfers: Supervision         General transfer comment: Pt able to rise w/o AD, minimal UE reliance, did tend to lean away from R but able to WB through R hip w/o safety issue of significant problem    Ambulation/Gait Ambulation/Gait assistance: Supervision Gait Distance (Feet): 200 Feet Assistive device: Rolling walker (2 wheels), None         General Gait Details: patient walked with the rolling walker, appropriate but short step length and flexed posture.  Encouraged continued use of rolling walker for off loading RLE for comfort (as per previous PT interactions). Minimally improved gait pattern and balance with cues for posture and appropriate AD use  Stairs            Wheelchair Mobility     Tilt Bed    Modified Rankin (Stroke Patients Only)       Balance Overall balance assessment: Needs assistance Sitting-balance support: Feet supported Sitting balance-Leahy Scale: Good     Standing balance support: Bilateral upper extremity supported Standing balance-Leahy Scale: Fair Standing balance comment: was able to maintain static standing EOB w/o AD/UEs, but better posture and confidence with UE use  Pertinent Vitals/Pain Pain Assessment Pain Assessment: Faces Pain Score: 10-Worst pain ever Faces Pain Scale: Hurts a little bit Pain Location: R hip, right side of lower back - pt has been complaining of this, generally for weeks - did not indicate severe pain functionally (no limp, minimal grimacing, etc) but endorses severe pain subjectively    Home Living Family/patient  expects to be discharged to:: Private residence Living Arrangements:  (pt reports she has a place to stay)                      Prior Function Prior Level of Function : Independent/Modified Independent             Mobility Comments: Pt indicates that she is normally able to do all she needs w/o AD and can stay active?       Extremity/Trunk Assessment   Upper Extremity Assessment Upper Extremity Assessment: Overall WFL for tasks assessed    Lower Extremity Assessment Lower Extremity Assessment: Overall WFL for tasks assessed       Communication   Communication Communication: No apparent difficulties    Cognition Arousal: Alert Behavior During Therapy: WFL for tasks assessed/performed                             Following commands: Intact       Cueing       General Comments General comments (skin integrity, edema, etc.): vague c/o subacute R "hip" pain (variously groin, trochanter, iliac crest, L back)    Exercises     Assessment/Plan    PT Assessment Patient needs continued PT services  PT Problem List Decreased strength;Decreased range of motion;Decreased activity tolerance;Decreased balance;Decreased mobility;Decreased knowledge of precautions;Decreased safety awareness;Decreased knowledge of use of DME       PT Treatment Interventions DME instruction;Balance training;Gait training;Neuromuscular re-education;Stair training;Functional mobility training;Patient/family education;Therapeutic activities;Therapeutic exercise    PT Goals (Current goals can be found in the Care Plan section)  Acute Rehab PT Goals Patient Stated Goal: to go home "today" PT Goal Formulation: With patient Time For Goal Achievement: 02/19/24 Potential to Achieve Goals: Fair    Frequency Min 1X/week     Co-evaluation               AM-PAC PT "6 Clicks" Mobility  Outcome Measure Help needed turning from your back to your side while in a flat bed  without using bedrails?: None Help needed moving from lying on your back to sitting on the side of a flat bed without using bedrails?: None Help needed moving to and from a bed to a chair (including a wheelchair)?: A Little Help needed standing up from a chair using your arms (e.g., wheelchair or bedside chair)?: A Little Help needed to walk in hospital room?: A Little Help needed climbing 3-5 steps with a railing? : A Little 6 Click Score: 20    End of Session Equipment Utilized During Treatment: Gait belt Activity Tolerance: Patient tolerated treatment well Patient left: in bed;with call bell/phone within reach Nurse Communication: Mobility status PT Visit Diagnosis: Other abnormalities of gait and mobility (R26.89);Difficulty in walking, not elsewhere classified (R26.2);Muscle weakness (generalized) (M62.81)    Time: 1610-9604 PT Time Calculation (min) (ACUTE ONLY): 12 min   Charges:   PT Evaluation $PT Re-evaluation: 1 Re-eval   PT General Charges $$ ACUTE PT VISIT: 1 Visit         Malachi Pro, DPT 02/06/2024,  10:44 AM

## 2024-02-07 DIAGNOSIS — G9341 Metabolic encephalopathy: Secondary | ICD-10-CM | POA: Diagnosis not present

## 2024-02-07 NOTE — Plan of Care (Signed)
  Problem: Nutritional: Goal: Maintenance of adequate nutrition will improve Outcome: Progressing   Problem: Skin Integrity: Goal: Risk for impaired skin integrity will decrease Outcome: Progressing   Problem: Nutrition: Goal: Adequate nutrition will be maintained Outcome: Progressing   Problem: Coping: Goal: Level of anxiety will decrease Outcome: Progressing   Problem: Pain Management: Goal: General experience of comfort will improve Outcome: Progressing

## 2024-02-07 NOTE — TOC Progression Note (Signed)
 Transition of Care [facility]) - Progression Note    Patient Details  Name: Meghan Jarvis MRN: [id number] Date of Birth: 1976/07/02  [facility] CM/SW Contact  Chapman Fitch, RN Phone Number: 02/07/2024, 3:59 PM  Clinical Narrative:      Yesterday patient stated she wanted to stay at her Aunt's house at discharge.  She could not provide me with Aunts name or contact information.  While in the room she called [name] [phone].  [name] states that she will get aunts contact information and provide to [facility] with DSS notified  Today patient states that her sister is willing to take guardianship. VM left for [place] with DSS and left sisters contact information.  TOC leadership updated         Expected Discharge Plan and Services         Expected Discharge Date: 01/10/24                                     Social Determinants of Health (SDOH) Interventions SDOH Screenings   Food Insecurity: Patient Unable To Answer (12/09/2023)  Recent Concern: Food Insecurity - Food Insecurity Present (09/26/2023)  Housing: Patient Unable To Answer (12/09/2023)  Recent Concern: Housing - Medium Risk (09/26/2023)  Transportation Needs: Patient Unable To Answer (12/09/2023)  Recent Concern: Transportation Needs - Unmet Transportation Needs (10/11/2023)  Utilities: Patient Unable To Answer (12/09/2023)  Recent Concern: Utilities - At Risk (09/25/2023)  Tobacco Use: High Risk (12/08/2023)    Readmission Risk Interventions     No data to display

## 2024-02-07 NOTE — Progress Notes (Signed)
 Progress Note   Patient: Meghan Jarvis:[id number] DOB: 22-Oct-1976 DOA: 12/08/2023     61 DOS: the patient was seen and examined on 02/07/2024   Brief hospital course: 47yo with h/o polysubstance (ETOH, cocaine) abuse, afib, homelessness, AIDS, hypothyroidism, and DM who presented on 12/08/23 with acute respiratory failure.  She was found down by a friend and could not wake her up.  She was found to be hypoxic in the 70s. Admitted to ICU, intubated, started on antibiotics for pneumonia.  12/31: extubated. ID consulted.  MRI brain with advanced cerebral atrophy.  Psych evaluated - "Patient is lacking and reasoning in her medical care for example she is unable to describe the consequence that she will have if she does not take her medications or has complications with her chronic conditions" and needs assistance with housing and finances due to cognitive decline.  APS/DSS pending guardianship and will need placement.    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  Viral load 2 million, CD4 14 on  admission Infectious disease following, started Biktarvy Placed on PCP and MAI prophylaxis with atovaquone and Zithromax Toxoplasma antibody IgG came back elevated but MRI of the brain does not show any enhancing lesions Repeat viral load down to 34,300 Infectious disease on board and case discussed   Pancytopenia  Secondary to HIV Platelet count and WBC have normalized Hgb is stable at 9.1   Cocaine abuse  Patient has been counseled on cessation of cocaine use   Pneumonia of right lower lobe due to infectious organism Sepsis physiology on presentation, resolved Completed 5 days of antibiotic Associated acute hypoxic respiratory failure also resolved Stable on room air   Alcohol abuse Continue thiamine, multivitamin, and folic acid   Uncontrolled type 2 diabetes mellitus with hypoglycemia, without long-term current use of insulin  Hemoglobin A1c 6.0 Not on home medications Does not need to be actively monitored at this time Encourage carb modified dietary choices and exercise   Rectal pain and scant BRBPR Mild hemorrhoid on exam Anusol cream prn    Borderline underweight based on BMI Body mass index is 19.51 -> 22.97 Nutrition Problem: Severe Malnutrition Etiology: chronic illness (HIV, homelessness, substance abuse) Signs/Symptoms: percent weight loss, severe fat depletion, severe muscle depletion Percent weight loss: 35 %          Consultants: ID Psychiatry PT OT Nutrition TOC team   Procedures: None   Antibiotics: Unasyn x 1 dose Ceftriaxone x 1 dose Augmentin x 2 doses Zosyn x 7 doses Bactrim 12/31-1/13 Azithromycin x 3 doses and now weekly   30 Day Unplanned Readmission Risk Score  Flowsheet Row ED to Hosp-Admission (Current) from 12/08/2023 in [facility]  30 Day Unplanned Readmission Risk Score (%) 35.01 Filed at 02/07/2024 0801       This score is the patient's risk of an unplanned readmission within  30 days of being discharged (0 -100%). The score is based on dignosis, age, lab data, medications, orders, and past utilization.   Low:  0-14.9   Medium: 15-21.9   High: 22-29.9   Extreme: 30 and above           Subjective: Reports abdominal pain, leg pain.  Also reports that her sister is willing to be her guardian and she wants to go home   Objective: Vitals:   02/06/24 2017 02/07/24 0755  BP: 98/65 98/68  Pulse: 94 100  Resp: 18 18  Temp: 98.2 F (36.8 C) 98.4 F (36.9 C)  SpO2: 100% 100%    Intake/Output Summary (Last 24 hours) at 02/07/2024 0826 Last data filed at 02/06/2024 1300 Gross per 24 hour  Intake 480 ml  Output --  Net 480 ml   Filed Weights   02/03/24 0429 02/05/24 0354 02/06/24 0400  Weight: 68.6 kg 68.6 kg 68.5 kg    Exam:  General:  Appears calm and comfortable and is in NAD, lying upside down on top of the bed Eyes:   EOMI, normal lids, iris ENT:  grossly normal hearing, lips & tongue, mmm Neck:  no LAD, masses or thyromegaly Cardiovascular:  RRR, no m/r/g. No LE edema.  Respiratory:   CTA bilaterally with no wheezes/rales/rhonchi.  Normal respiratory effort. Abdomen:  soft, NT, ND Skin:  no rash or induration seen on limited exam Musculoskeletal:  grossly normal tone BUE/BLE, good ROM, no bony abnormality Psychiatric:  blunted mood and affect, speech fluent and appropriate, AOx3 Neurologic:  CN 2-12 grossly intact, moves all extremities in coordinated fashion  Data Reviewed: I have reviewed the patient's lab results since admission.  Pertinent labs for today include:   Last on 2/25     Family Communication: Boyfriend was present  Disposition: Status is: Inpatient Remains inpatient appropriate because: unsafe disposition     Time spent: 25 minutes  Unresulted Labs (From admission, onward)     Start     Ordered   01/02/24 0500  Miscellaneous [facility] test (send-out)  Tomorrow morning,   R       Question:  Test name / description:   Answer:  [id number] Acid-fast (Mycobacteria) Smear and Culture With Reflex to Identification and Susceptibility Testing   01/01/24 1415   12/31/23 1134  Fungus culture, blood  Once,   R       Question:  Patient immune status  Answer:  Immunocompromised   12/31/23 1133             Author: Jonah Blue, MD 02/07/2024 8:26 AM  For on call review [email or web address].

## 2024-02-08 DIAGNOSIS — F02818 Dementia in other diseases classified elsewhere, unspecified severity, with other behavioral disturbance: Secondary | ICD-10-CM

## 2024-02-08 DIAGNOSIS — B20 Human immunodeficiency virus [HIV] disease: Secondary | ICD-10-CM | POA: Diagnosis not present

## 2024-02-08 NOTE — Plan of Care (Signed)
  Problem: Coping: Goal: Ability to adjust to condition or change in health will improve Outcome: Progressing   Problem: Fluid Volume: Goal: Ability to maintain a balanced intake and output will improve Outcome: Progressing   Problem: Health Behavior/Discharge Planning: Goal: Ability to identify and utilize available resources and services will improve Outcome: Progressing Goal: Ability to manage health-related needs will improve Outcome: Progressing   Problem: Metabolic: Goal: Ability to maintain appropriate glucose levels will improve Outcome: Progressing   Problem: Nutritional: Goal: Maintenance of adequate nutrition will improve Outcome: Progressing Goal: Progress toward achieving an optimal weight will improve Outcome: Progressing   Problem: Skin Integrity: Goal: Risk for impaired skin integrity will decrease Outcome: Progressing   Problem: Tissue Perfusion: Goal: Adequacy of tissue perfusion will improve Outcome: Progressing   Problem: Education: Goal: Knowledge of General Education information will improve Description: Including pain rating scale, medication(s)/side effects and non-pharmacologic comfort measures Outcome: Progressing   Problem: Health Behavior/Discharge Planning: Goal: Ability to manage health-related needs will improve Outcome: Progressing   Problem: Clinical Measurements: Goal: Ability to maintain clinical measurements within normal limits will improve Outcome: Progressing Goal: Will remain free from infection Outcome: Progressing Goal: Diagnostic test results will improve Outcome: Progressing Goal: Respiratory complications will improve Outcome: Progressing Goal: Cardiovascular complication will be avoided Outcome: Progressing   Problem: Activity: Goal: Risk for activity intolerance will decrease Outcome: Progressing   Problem: Nutrition: Goal: Adequate nutrition will be maintained Outcome: Progressing   Problem: Coping: Goal:  Level of anxiety will decrease Outcome: Progressing   Problem: Elimination: Goal: Will not experience complications related to bowel motility Outcome: Progressing Goal: Will not experience complications related to urinary retention Outcome: Progressing   Problem: Pain Management: Goal: General experience of comfort will improve Outcome: Progressing   Problem: Safety: Goal: Ability to remain free from injury will improve Outcome: Progressing   Problem: Skin Integrity: Goal: Risk for impaired skin integrity will decrease Outcome: Progressing   Problem: Activity: Goal: Ability to tolerate increased activity will improve Outcome: Progressing   Problem: Respiratory: Goal: Ability to maintain a clear airway and adequate ventilation will improve Outcome: Progressing

## 2024-02-08 NOTE — Progress Notes (Signed)
 Physical Therapy Treatment Patient Details Name: Meghan Jarvis MRN: [id number] DOB: May 07, 1976 Today's Date: 02/08/2024   History of Present Illness Pt is a 48 year old female admitted with [facility] with acute hypoxic respiratory failure, severe sepsis, community acquired PNA acute metabolic encephalopathy; She was intubated, extubated 12/30    PMH significant for alcohol abuse, cocaine abuse, homelessness, AIDS, atrial fibrillation, hypothyroidism, type 2 diabetes mellitus.  Re-eval 2/26 for "new onset" R hip pain.    PT Comments  Patient in bed upon PT arrival with guest present. Patient agreeable to participate with therapy. C/o hip pain but does not demonstrate antalgic gait pattern. She ambulates quickly with RW with minimal need for rest. She is able to transition without AD with sit to stands. Patient returned to bed with needs met. Current POC remains appropriate.     If plan is discharge home, recommend the following: Assistance with cooking/housework;Direct supervision/assist for financial management;Direct supervision/assist for medications management;Assist for transportation;Help with stairs or ramp for entrance;Supervision due to cognitive status;A little help with bathing/dressing/bathroom   Can travel by private vehicle     Yes  Equipment Recommendations       Recommendations for Other Services       Precautions / Restrictions Precautions Precautions: Fall Restrictions Weight Bearing Restrictions Per Provider Order: No     Mobility  Bed Mobility                    Transfers Overall transfer level: Modified independent Equipment used: None Transfers: Sit to/from Stand Sit to Stand: Supervision           General transfer comment: sit to stand x 2 without AD,    Ambulation/Gait Ambulation/Gait assistance: Supervision, Contact guard assist Gait Distance (Feet): 200 Feet Assistive device: Rolling walker (2 wheels), None Gait Pattern/deviations:  Step-through pattern, Trunk flexed, Narrow base of support, Antalgic       General Gait Details: Patient ambulated with good speed and minimal gait change despite pain in hip.   Stairs             Wheelchair Mobility     Tilt Bed    Modified Rankin (Stroke Patients Only)       Balance Overall balance assessment: Needs assistance Sitting-balance support: Feet supported Sitting balance-Leahy Scale: Good       Standing balance-Leahy Scale: Fair Standing balance comment: was able to maintain static standing EOB w/o AD/UEs, but better posture and confidence with UE use                            Communication Communication Communication: No apparent difficulties  Cognition Arousal: Alert Behavior During Therapy: WFL for tasks assessed/performed                             Following commands: Intact      Cueing    Exercises Other Exercises Other Exercises: importance of ambulation and mobility    General Comments General comments (skin integrity, edema, etc.): reports R hip pain      Pertinent Vitals/Pain Pain Assessment Pain Score: 9  Pain Location: R hip, right side of lower back - pt has been complaining of this, generally for weeks - did not indicate severe pain functionally (no limp, minimal grimacing, etc) but endorses severe pain subjectively Pain Intervention(s): Monitored during session    Home Living  Prior Function            PT Goals (current goals can now be found in the care plan section) Acute Rehab PT Goals Patient Stated Goal: to go home PT Goal Formulation: With patient Time For Goal Achievement: 02/04/24 Potential to Achieve Goals: Fair Progress towards PT goals: Progressing toward goals    Frequency    Min 1X/week      PT Plan      Co-evaluation              AM-PAC PT "6 Clicks" Mobility   Outcome Measure  Help needed turning from your back to your  side while in a flat bed without using bedrails?: None Help needed moving from lying on your back to sitting on the side of a flat bed without using bedrails?: None Help needed moving to and from a bed to a chair (including a wheelchair)?: A Little Help needed standing up from a chair using your arms (e.g., wheelchair or bedside chair)?: A Little Help needed to walk in hospital room?: A Little Help needed climbing 3-5 steps with a railing? : A Little 6 Click Score: 20    End of Session Equipment Utilized During Treatment: Gait belt Activity Tolerance: Patient tolerated treatment well Patient left: in bed;with call bell/phone within reach Nurse Communication: Mobility status PT Visit Diagnosis: Other abnormalities of gait and mobility (R26.89);Difficulty in walking, not elsewhere classified (R26.2);Muscle weakness (generalized) (M62.81)     Time: 1610-9604 PT Time Calculation (min) (ACUTE ONLY): 8 min  Charges:    $Therapeutic Activity: 8-22 mins PT General Charges $$ ACUTE PT VISIT: 1 Visit                     Precious Bard, PT, DPT Physical Therapist - [place]   02/08/2024, 4:16 PM

## 2024-02-08 NOTE — Progress Notes (Signed)
 Progress Note   Patient: Meghan Jarvis WJX:[id number] DOB: 11-22-1976 DOA: 12/08/2023     62 DOS: the patient was seen and examined on 02/08/2024   Brief hospital course: 48yo with h/o polysubstance (ETOH, cocaine) abuse, afib, homelessness, AIDS, hypothyroidism, and DM who presented on 12/08/23 with acute respiratory failure.  She was found down by a friend and could not wake her up.  She was found to be hypoxic in the 70s. Admitted to ICU, intubated, started on antibiotics for pneumonia.  12/31: extubated. ID consulted.  MRI brain with advanced cerebral atrophy.  Psych evaluated - "Patient is lacking and reasoning in her medical care for example she is unable to describe the consequence that she will have if she does not take her medications or has complications with her chronic conditions" and needs assistance with housing and finances due to cognitive decline.  APS/DSS pending guardianship and will need placement.    Assessment and Plan:  HIV Associated Neurocognitive Disorder Mental status improved from admission but patient is a poor historian and does not have capacity to make decisions at this time per psychiatry DSS referral by [facility] MRI did not show any enhancing lesions but did show severe cerebral atrophy - likely HIV associated neurocognitive disorder DSS is working on obtaining guardianship Patient should not be discharged prior to DSS approval per hospital leadership Today the patient says that she spoke with her sister, who is willing to assume guardianship   Pain Has complained of back, knee, hip, and abdominal pain during hospitalization Exam nonspecific Neg XR L hip, lumbar spine, R knee, CT A/P, MR lumbar and thoracic spine, L hand/wrist, R elbow, L shoulder, CT C spine PT/OT consulted and suggest home PT vs. 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  Viral load 2 million, CD4 14  on admission Infectious disease following, started Biktarvy Placed on PCP and MAI prophylaxis with atovaquone and Zithromax Toxoplasma antibody IgG came back elevated but MRI of the brain does not show any enhancing lesions Repeat viral load down to 34,300 Infectious disease on board and case discussed   Pancytopenia  Secondary to HIV Platelet count and WBC have normalized Hgb is stable at last check   Cocaine abuse  Patient has been counseled on cessation of cocaine use   Pneumonia of right lower lobe due to infectious organism Sepsis physiology on presentation, resolved Completed 5 days of antibiotic Associated acute hypoxic respiratory failure also resolved Stable on room air   Alcohol abuse Continue thiamine, multivitamin, and folic acid   Uncontrolled type 2 diabetes mellitus with hypoglycemia, without long-term current use of insulin  Hemoglobin A1c 6.0 Not on home medications Does not need to be actively monitored at this time Encourage carb modified dietary choices and exercise   Rectal pain and scant BRBPR Mild hemorrhoid on exam Anusol cream prn    Borderline underweight based on BMI Body mass index is 19.51 -> 22.97 Nutrition Problem: Severe Malnutrition Etiology: chronic illness (HIV, homelessness, substance abuse) Signs/Symptoms: percent weight loss, severe fat depletion, severe muscle depletion Percent weight loss: 35 %          Consultants: ID Psychiatry PT OT Nutrition TOC team   Procedures: None   Antibiotics: Unasyn x 1 dose Ceftriaxone x 1 dose Augmentin x 2 doses Zosyn x 7 doses Bactrim 12/31-1/13 Azithromycin x 3 doses and now weekly     30 Day Unplanned Readmission  Risk Score    Flowsheet Row ED to Hosp-Admission (Current) from 12[facility]08/2023 in [facility]  30 Day Unplanned Readmission Risk Score (%) 29.51 Filed at 02/08/2024 0801       This score is the patient's risk of an unplanned  readmission within 30 days of being discharged (0 -100%). The score is based on dignosis, age, lab data, medications, orders, and past utilization.   Low:  0-14.9   Medium: 15-21.9   High: 22-29.9   Extreme: 30 and above           Subjective: Today she reports that she wants to leave and will be staying with her brother at the time of discharge.   Objective: Vitals:   02/08/24 0850 02/08/24 1518  BP: 102/69 100/68  Pulse: 95 91  Resp: 18 18  Temp: 97.8 F (36.6 C) 98 F (36.7 C)  SpO2: 100% 100%    Intake/Output Summary (Last 24 hours) at 02/08/2024 1524 Last data filed at 02/08/2024 1442 Gross per 24 hour  Intake 840 ml  Output --  Net 840 ml   Filed Weights   02/05/24 0354 02/06/24 0400 02/08/24 0331  Weight: 68.6 kg 68.5 kg 71.6 kg    Exam:  General:  Appears calm and comfortable and is in NAD, lying upside down on top of the bed Eyes:   EOMI, normal lids, iris ENT:  grossly normal hearing, lips & tongue, mmm Neck:  no LAD, masses or thyromegaly Cardiovascular:  RRR, no m/r/g. No LE edema.  Respiratory:   CTA bilaterally with no wheezes/rales/rhonchi.  Normal respiratory effort. Abdomen:  soft, NT, ND Skin:  no rash or induration seen on limited exam Musculoskeletal:  grossly normal tone BUE/BLE, good ROM, no bony abnormality Psychiatric:  blunted mood and affect, speech fluent and appropriate, AOx3 Neurologic:  CN 2-12 grossly intact, moves all extremities in coordinated fashion  Data Reviewed: I have reviewed the patient's lab results since admission.  Pertinent labs for today include:   Last on 2/25     Family Communication: Boyfriend was present  Disposition: Status is: Inpatient Remains inpatient appropriate because: unsafe disposition     Time spent: 25 minutes  Unresulted Labs (From admission, onward)     Start     Ordered   01/02/24 0500  Miscellaneous [facility] test (send-out)  Tomorrow morning,   R       Question:  Test name /  description:  Answer:  [id number] Acid-fast (Mycobacteria) Smear and Culture With Reflex to Identification and Susceptibility Testing   01/01/24 1415   12/31/23 1134  Fungus culture, blood  Once,   R       Question:  Patient immune status  Answer:  Immunocompromised   12/31/23 1133             Author: Jonah Blue, MD 02/08/2024 3:24 PM  For on call review [email or web address].

## 2024-02-09 DIAGNOSIS — B20 Human immunodeficiency virus [HIV] disease: Secondary | ICD-10-CM | POA: Diagnosis not present

## 2024-02-09 DIAGNOSIS — F02818 Dementia in other diseases classified elsewhere, unspecified severity, with other behavioral disturbance: Secondary | ICD-10-CM | POA: Diagnosis not present

## 2024-02-09 NOTE — Plan of Care (Signed)
  Problem: Coping: Goal: Ability to adjust to condition or change in health will improve Outcome: Progressing   Problem: Fluid Volume: Goal: Ability to maintain a balanced intake and output will improve Outcome: Progressing   Problem: Health Behavior/Discharge Planning: Goal: Ability to identify and utilize available resources and services will improve Outcome: Progressing Goal: Ability to manage health-related needs will improve Outcome: Progressing   Problem: Metabolic: Goal: Ability to maintain appropriate glucose levels will improve Outcome: Progressing   Problem: Nutritional: Goal: Maintenance of adequate nutrition will improve Outcome: Progressing Goal: Progress toward achieving an optimal weight will improve Outcome: Progressing   Problem: Skin Integrity: Goal: Risk for impaired skin integrity will decrease Outcome: Progressing   Problem: Tissue Perfusion: Goal: Adequacy of tissue perfusion will improve Outcome: Progressing   Problem: Education: Goal: Knowledge of General Education information will improve Description: Including pain rating scale, medication(s)/side effects and non-pharmacologic comfort measures Outcome: Progressing   Problem: Health Behavior/Discharge Planning: Goal: Ability to manage health-related needs will improve Outcome: Progressing   Problem: Clinical Measurements: Goal: Ability to maintain clinical measurements within normal limits will improve Outcome: Progressing Goal: Will remain free from infection Outcome: Progressing Goal: Diagnostic test results will improve Outcome: Progressing Goal: Respiratory complications will improve Outcome: Progressing Goal: Cardiovascular complication will be avoided Outcome: Progressing   Problem: Activity: Goal: Risk for activity intolerance will decrease Outcome: Progressing   Problem: Nutrition: Goal: Adequate nutrition will be maintained Outcome: Progressing   Problem: Coping: Goal:  Level of anxiety will decrease Outcome: Progressing   Problem: Elimination: Goal: Will not experience complications related to bowel motility Outcome: Progressing Goal: Will not experience complications related to urinary retention Outcome: Progressing   Problem: Pain Management: Goal: General experience of comfort will improve Outcome: Progressing   Problem: Safety: Goal: Ability to remain free from injury will improve Outcome: Progressing   Problem: Skin Integrity: Goal: Risk for impaired skin integrity will decrease Outcome: Progressing   Problem: Activity: Goal: Ability to tolerate increased activity will improve Outcome: Progressing   Problem: Respiratory: Goal: Ability to maintain a clear airway and adequate ventilation will improve Outcome: Progressing

## 2024-02-09 NOTE — Progress Notes (Signed)
 Progress Note   Patient: Meghan Jarvis [id number] DOB: 07/01/1976 DOA: 12/08/2023     50 DOS: the patient was seen and examined on 02/09/2024   Brief hospital course: 48yo with h/o polysubstance (ETOH, cocaine) abuse, afib, homelessness, AIDS, hypothyroidism, and DM who presented on 12/08/23 with acute respiratory failure.  She was found down by a friend and could not wake her up.  She was found to be hypoxic in the 70s. Admitted to ICU, intubated, started on antibiotics for pneumonia.  12/31: extubated. ID consulted.  MRI brain with advanced cerebral atrophy.  Psych evaluated - "Patient is lacking and reasoning in her medical care for example she is unable to describe the consequence that she will have if she does not take her medications or has complications with her chronic conditions" and needs assistance with housing and finances due to cognitive decline.  APS/DSS pending guardianship and will need placement.    Assessment and Plan:  HIV Associated Neurocognitive Disorder Mental status improved from admission but patient is a poor historian and does not have capacity to make decisions at this time per psychiatry DSS referral by [facility] MRI did not show any enhancing lesions but did show severe cerebral atrophy - likely HIV associated neurocognitive disorder DSS is working on obtaining guardianship Patient should not be discharged prior to DSS approval per hospital leadership Today the patient says that she is moving in with her brother   Pain Has complained of back, knee, hip, and abdominal pain during hospitalization Exam nonspecific Neg XR L hip, lumbar spine, R knee, CT A/P, MR lumbar and thoracic spine, L hand/wrist, R elbow, L shoulder, CT C spine PT/OT consulted and suggest home PT vs. 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  Viral load 2 million, CD4 14 on admission Infectious disease  following, started Biktarvy Placed on PCP and MAI prophylaxis with atovaquone and Zithromax Toxoplasma antibody IgG came back elevated but MRI of the brain does not show any enhancing lesions Repeat viral load down to 34,300 Infectious disease on board and case discussed   Pancytopenia  Secondary to HIV Platelet count and WBC have normalized Hgb is stable at last check   Cocaine abuse  Patient has been counseled on cessation of cocaine use   Alcohol abuse Continue thiamine, multivitamin, and folic acid        Consultants: ID Psychiatry PT OT Nutrition TOC team   Procedures: None   Antibiotics: Unasyn x 1 dose Ceftriaxone x 1 dose Augmentin x 2 doses Zosyn x 7 doses Bactrim 12/31-1/13 Azithromycin x 3 doses and now weekly      30 Day Unplanned Readmission Risk Score    Flowsheet Row ED to Hosp-Admission (Current) from 12/08/2023 in [facility]  30 Day Unplanned Readmission Risk Score (%) 30.83 Filed at 02/09/2024 0800       This score is the patient's risk of an unplanned readmission within 30 days of being discharged (0 -100%). The score is based on dignosis, age, lab data, medications, orders, and past utilization.   Low:  0-14.9   Medium: 15-21.9   High: 22-29.9   Extreme: 30 and above           Subjective: Feeling ok, planning to live with her brother.   Objective: Vitals:   02/09/24 0227 02/09/24 0759  BP: 108/75 95/63  Pulse: (!) 105 96  Resp: 18 12  Temp: 97.9 F (36.6 C) 98.4 F (36.9 C)  SpO2: 100% 100%    Intake/Output Summary (Last 24 hours) at 02/09/2024 0821 Last data filed at 02/08/2024 1442 Gross per 24 hour  Intake 240 ml  Output --  Net 240 ml   Filed Weights   02/05/24 0354 02/06/24 0400 02/08/24 0331  Weight: 68.6 kg 68.5 kg 71.6 kg    Exam:  General:  Appears calm and comfortable and is in NAD Eyes:   EOMI, normal lids, iris ENT:  grossly normal hearing, lips & tongue,  mmm Neck:  no LAD, masses or thyromegaly Cardiovascular:  RRR, no m/r/g. No LE edema.  Respiratory:   CTA bilaterally with no wheezes/rales/rhonchi.  Normal respiratory effort. Abdomen:  soft, NT, ND Skin:  no rash or induration seen on limited exam Musculoskeletal:  grossly normal tone BUE/BLE, good ROM, no bony abnormality Psychiatric:  blunted mood and affect, speech fluent and appropriate, AOx3 Neurologic:  CN 2-12 grossly intact, moves all extremities in coordinated fashion  Data Reviewed: I have reviewed the patient's lab results since admission.  Pertinent labs for today include:   Last on 2/25     Family Communication: Boyfriend was present  Disposition: Status is: Inpatient Remains inpatient appropriate because: unsafe disposition     Time spent: 25 minutes  Unresulted Labs (From admission, onward)     Start     Ordered   01/02/24 0500  Miscellaneous [facility] test (send-out)  Tomorrow morning,   R       Question:  Test name / description:  Answer:  [id number] Acid-fast (Mycobacteria) Smear and Culture With Reflex to Identification and Susceptibility Testing   01/01/24 1415   12/31/23 1134  Fungus culture, blood  Once,   R       Question:  Patient immune status  Answer:  Immunocompromised   12/31/23 1133             Author: Jonah Blue, MD 02/09/2024 8:21 AM  For on call review [email or web address].

## 2024-02-09 NOTE — Progress Notes (Signed)
 Mobility Specialist - Progress Note   02/09/24 0900  Mobility  Activity Ambulated independently in hallway  Level of Assistance Independent  Assistive Device Front wheel walker  Distance Ambulated (ft) 100 ft  Activity Response Tolerated well  Mobility visit 1 Mobility     Pt ambulated in hallway independently with RW. No LOB. VC to maintain hand placement; slightly unsteady without UE support. Pt voices pain in RLE but no limping noted. Pt provided with clean gown/linen for bath set-up independently. Pt left EOB with needs in reach.    Filiberto Pinks Mobility Specialist 02/09/24, 9:27 AM

## 2024-02-09 NOTE — Plan of Care (Signed)
  Problem: Coping: Goal: Ability to adjust to condition or change in health will improve Outcome: Progressing   Problem: Health Behavior/Discharge Planning: Goal: Ability to identify and utilize available resources and services will improve Outcome: Progressing   Problem: Education: Goal: Knowledge of General Education information will improve Description: Including pain rating scale, medication(s)/side effects and non-pharmacologic comfort measures Outcome: Progressing   Problem: Nutrition: Goal: Adequate nutrition will be maintained Outcome: Progressing   Problem: Safety: Goal: Ability to remain free from injury will improve Outcome: Progressing   Problem: Activity: Goal: Ability to tolerate increased activity will improve Outcome: Progressing

## 2024-02-10 DIAGNOSIS — B20 Human immunodeficiency virus [HIV] disease: Secondary | ICD-10-CM | POA: Diagnosis not present

## 2024-02-10 DIAGNOSIS — F02818 Dementia in other diseases classified elsewhere, unspecified severity, with other behavioral disturbance: Secondary | ICD-10-CM | POA: Diagnosis not present

## 2024-02-10 MED ORDER — ORAL CARE MOUTH RINSE: 15.0000 mL | OROMUCOSAL | Status: DC | PRN

## 2024-02-10 NOTE — Progress Notes (Signed)
 Progress Note   Patient: Meghan Jarvis [id number] DOB: 1976-06-11 DOA: 12/08/2023     64 DOS: the patient was seen and examined on 02/10/2024   Brief hospital course: 47yo with h/o polysubstance (ETOH, cocaine) abuse, afib, homelessness, AIDS, hypothyroidism, and DM who presented on 12/08/23 with acute respiratory failure.  She was found down by a friend and could not wake her up.  She was found to be hypoxic in the 70s. Admitted to ICU, intubated, started on antibiotics for pneumonia.  12/31: extubated. ID consulted.  MRI brain with advanced cerebral atrophy.  Psych evaluated - "Patient is lacking and reasoning in her medical care for example she is unable to describe the consequence that she will have if she does not take her medications or has complications with her chronic conditions" and needs assistance with housing and finances due to cognitive decline.  APS/DSS pending guardianship and will need placement.    Assessment and Plan:  HIV Associated Neurocognitive Disorder Mental status improved from admission but patient is a poor historian and does not have capacity to make decisions at this time per psychiatry DSS referral by [facility] MRI did not show any enhancing lesions but did show severe cerebral atrophy - likely HIV associated neurocognitive disorder DSS is working on obtaining guardianship Patient should not be discharged prior to DSS approval per hospital leadership The patient is currently saying that she is planning to move in with her brother   Pain Has complained of back, knee, hip, and abdominal pain during hospitalization Exam nonspecific Neg XR L hip, lumbar spine, R knee, CT A/P, MR lumbar and thoracic spine, L hand/wrist, R elbow, L shoulder, CT C spine PT/OT consulted and suggest home PT vs. 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  Viral load 2 million, CD4 14 on  admission Infectious disease following, started Biktarvy Placed on PCP and MAI prophylaxis with atovaquone and Zithromax Toxoplasma antibody IgG came back elevated but MRI of the brain does not show any enhancing lesions Repeat viral load down to 34,300 Infectious disease on board and case discussed   Pancytopenia  Secondary to HIV Platelet count and WBC have normalized Hgb is stable at last check   Cocaine abuse  Patient has been counseled on cessation of cocaine use   Alcohol abuse Continue thiamine, multivitamin, and folic acid         Consultants: ID Psychiatry PT OT Nutrition TOC team   Procedures: None   Antibiotics: Unasyn x 1 dose Ceftriaxone x 1 dose Augmentin x 2 doses Zosyn x 7 doses Bactrim 12/31-1/13 Azithromycin x 3 doses and now weekly     30 Day Unplanned Readmission Risk Score    Flowsheet Row ED to Hosp-Admission (Current) from 12[facility]08/2023 in [facility]  30 Day Unplanned Readmission Risk Score (%) 31.1 Filed at 02/10/2024 0801       This score is the patient's risk of an unplanned readmission within 30 days of being discharged (0 -100%). The score is based on dignosis, age, lab data, medications, orders, and past utilization.   Low:  0-14.9   Medium: 15-21.9   High: 22-29.9   Extreme: 30 and above           Subjective: No specific concerns today.   Objective: Vitals:   02/09/24 0759 02/09/24 1953  BP: 95/63 104/70  Pulse: 96 97  Resp: 12 16  Temp:  98.4 F (36.9 C) 98.1 F (36.7 C)  SpO2: 100% 99%   No intake or output data in the 24 hours ending 02/10/24 0820 Filed Weights   02/05/24 0354 02/06/24 0400 02/08/24 0331  Weight: 68.6 kg 68.5 kg 71.6 kg    Exam:  General:  Appears calm and comfortable and is in NAD Eyes:   EOMI, normal lids, iris ENT:  grossly normal hearing, lips & tongue, mmm Cardiovascular:  RRR. No LE edema.  Respiratory:   CTA bilaterally with no  wheezes/rales/rhonchi.  Normal respiratory effort. Abdomen:  soft, NT, ND Skin:  no rash or induration seen on limited exam Musculoskeletal:  grossly normal tone BUE/BLE, good ROM, no bony abnormality Psychiatric:  blunted mood and affect, speech fluent and appropriate, AOx3 Neurologic:  CN 2-12 grossly intact, moves all extremities in coordinated fashion  Data Reviewed: I have reviewed the patient's lab results since admission.  Pertinent labs for today include:   Last on 2/25     Family Communication: Boyfriend was present  Disposition: Status is: Inpatient Remains inpatient appropriate because: unsafe disposition     Time spent: 25 minutes  Unresulted Labs (From admission, onward)     Start     Ordered   01/02/24 0500  Miscellaneous [facility] test (send-out)  Tomorrow morning,   R       Question:  Test name / description:  Answer:  [id number] Acid-fast (Mycobacteria) Smear and Culture With Reflex to Identification and Susceptibility Testing   01/01/24 1415   12/31/23 1134  Fungus culture, blood  Once,   R       Question:  Patient immune status  Answer:  Immunocompromised   12/31/23 1133             Author: Jonah Blue, MD 02/10/2024 8:20 AM  For on call review [email or web address].

## 2024-02-11 DIAGNOSIS — F02818 Dementia in other diseases classified elsewhere, unspecified severity, with other behavioral disturbance: Secondary | ICD-10-CM | POA: Diagnosis not present

## 2024-02-11 DIAGNOSIS — B20 Human immunodeficiency virus [HIV] disease: Secondary | ICD-10-CM | POA: Diagnosis not present

## 2024-02-11 NOTE — Plan of Care (Signed)
  Problem: Coping: Goal: Ability to adjust to condition or change in health will improve Outcome: Progressing   Problem: Fluid Volume: Goal: Ability to maintain a balanced intake and output will improve Outcome: Progressing   Problem: Health Behavior/Discharge Planning: Goal: Ability to identify and utilize available resources and services will improve Outcome: Progressing Goal: Ability to manage health-related needs will improve Outcome: Progressing   Problem: Metabolic: Goal: Ability to maintain appropriate glucose levels will improve Outcome: Progressing   Problem: Nutritional: Goal: Maintenance of adequate nutrition will improve Outcome: Progressing Goal: Progress toward achieving an optimal weight will improve Outcome: Progressing   Problem: Skin Integrity: Goal: Risk for impaired skin integrity will decrease Outcome: Progressing   Problem: Tissue Perfusion: Goal: Adequacy of tissue perfusion will improve Outcome: Progressing   Problem: Education: Goal: Knowledge of General Education information will improve Description: Including pain rating scale, medication(s)/side effects and non-pharmacologic comfort measures Outcome: Progressing   Problem: Health Behavior/Discharge Planning: Goal: Ability to manage health-related needs will improve Outcome: Progressing   Problem: Clinical Measurements: Goal: Ability to maintain clinical measurements within normal limits will improve Outcome: Progressing Goal: Will remain free from infection Outcome: Progressing Goal: Diagnostic test results will improve Outcome: Progressing Goal: Respiratory complications will improve Outcome: Progressing Goal: Cardiovascular complication will be avoided Outcome: Progressing   Problem: Activity: Goal: Risk for activity intolerance will decrease Outcome: Progressing   Problem: Nutrition: Goal: Adequate nutrition will be maintained Outcome: Progressing   Problem: Coping: Goal:  Level of anxiety will decrease Outcome: Progressing   Problem: Elimination: Goal: Will not experience complications related to bowel motility Outcome: Progressing Goal: Will not experience complications related to urinary retention Outcome: Progressing   Problem: Pain Management: Goal: General experience of comfort will improve Outcome: Progressing   Problem: Safety: Goal: Ability to remain free from injury will improve Outcome: Progressing   Problem: Skin Integrity: Goal: Risk for impaired skin integrity will decrease Outcome: Progressing   Problem: Activity: Goal: Ability to tolerate increased activity will improve Outcome: Progressing   Problem: Respiratory: Goal: Ability to maintain a clear airway and adequate ventilation will improve Outcome: Progressing

## 2024-02-11 NOTE — Progress Notes (Signed)
 Progress Note   Patient: Meghan Jarvis [id number] DOB: [birth year] DOA: 12/08/2023     65 DOS: the patient was seen and examined on 02/11/2024   Brief hospital course: 47yo with h/o polysubstance (ETOH, cocaine) abuse, afib, homelessness, AIDS, hypothyroidism, and DM who presented on 12/08/23 with acute respiratory failure.  She was found down by a friend and could not wake her up.  She was found to be hypoxic in the 70s. Admitted to ICU, intubated, started on antibiotics for pneumonia.  12/31: extubated. ID consulted.  MRI brain with advanced cerebral atrophy.  Psych evaluated - "Patient is lacking and reasoning in her medical care for example she is unable to describe the consequence that she will have if she does not take her medications or has complications with her chronic conditions" and needs assistance with housing and finances due to cognitive decline.  APS/DSS pending guardianship and will need placement.    Assessment and Plan:   Note: Patient is medically stable and awaiting DSS guardianship for placement.   HIV Associated Neurocognitive Disorder Mental status improved from admission but patient is a poor historian and does not have capacity to make decisions at this time per psychiatry DSS referral by [facility] MRI did not show any enhancing lesions but did show severe cerebral atrophy - likely HIV associated neurocognitive disorder DSS is working on obtaining guardianship Patient should not be discharged prior to DSS approval per hospital leadership The patient is currently saying that she is planning to move in with her aunt, who lives in [place] and is in her 90s and doesn't drive   Pain Has complained of back, knee, hip, and abdominal pain during hospitalization Exam nonspecific Neg XR L hip, lumbar spine, R knee, CT A/P, MR lumbar and thoracic spine, L hand/wrist, R elbow, L shoulder, CT C spine PT/OT consulted and suggest home PT vs. 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  Viral load 2 million, CD4 14 on admission Infectious disease following, started Biktarvy Placed on PCP and MAI prophylaxis with atovaquone and Zithromax Toxoplasma antibody IgG came back elevated but MRI of the brain does not show any enhancing lesions Repeat viral load down to 34,300 Infectious disease on board and case discussed   Pancytopenia  Secondary to HIV Platelet count and WBC have normalized Hgb is stable at last check   Cocaine abuse  Patient has been counseled on cessation of cocaine use   Alcohol abuse Continue thiamine, multivitamin, and folic acid         Consultants: ID Psychiatry PT OT Nutrition TOC team   Procedures: None   Antibiotics: Unasyn x 1 dose Ceftriaxone x 1 dose Augmentin x 2 doses Zosyn x 7 doses Bactrim 12/31-1/13 Azithromycin x 3 doses and now weekly    30 Day Unplanned Readmission Risk Score    Flowsheet Row ED to Hosp-Admission (Current) from 12/08/2023 in [facility]  30 Day Unplanned Readmission Risk Score (%) 31.1 Filed at 02/11/2024 0801       This score is the patient's risk of an unplanned readmission within 30 days of being discharged (0 -100%). The score is based on dignosis, age, lab data, medications, orders, and past utilization.   Low:  0-14.9   Medium: 15-21.9   High: 22-29.9   Extreme: 30 and above           Subjective: No  complaints   Objective: Vitals:   02/10/24 1942 02/11/24 0346  BP: 95/76 108/74  Pulse: 89 97  Resp: 18 18  Temp: 98.1 F (36.7 C) 98.4 F (36.9 C)  SpO2: 100% 97%   No intake or output data in the 24 hours ending 02/11/24 0821 Filed Weights   02/06/24 0400 02/08/24 0331 02/11/24 0352  Weight: 68.5 kg 71.6 kg 70.1 kg    Exam:  General:  Appears calm and comfortable and is in NAD Eyes:   EOMI, normal lids,  iris ENT:  grossly normal hearing, lips & tongue, mmm Cardiovascular:  RRR. No LE edema.  Respiratory:   CTA bilaterally with no wheezes/rales/rhonchi.  Normal respiratory effort. Abdomen:  soft, NT, ND Skin:  no rash or induration seen on limited exam Musculoskeletal:  grossly normal tone BUE/BLE, good ROM, no bony abnormality Psychiatric:  blunted mood and affect, speech fluent and appropriate, AOx3 Neurologic:  CN 2-12 grossly intact, moves all extremities in coordinated fashion  Data Reviewed: I have reviewed the patient's lab results since admission.  Pertinent labs for today include:   Last on 2/25     Family Communication: None present today  Disposition: Status is: Inpatient Remains inpatient appropriate because: unsafe disposition     Time spent: 25 minutes  Unresulted Labs (From admission, onward)     Start     Ordered   01/02/24 0500  Miscellaneous [facility] test (send-out)  Tomorrow morning,   R       Question:  Test name / description:  Answer:  [id number] Acid-fast (Mycobacteria) Smear and Culture With Reflex to Identification and Susceptibility Testing   01/01/24 1415   12/31/23 1134  Fungus culture, blood  Once,   R       Question:  Patient immune status  Answer:  Immunocompromised   12/31/23 1133             Author: Jonah Blue, MD 02/11/2024 8:21 AM  For on call review [email or web address].

## 2024-02-12 DIAGNOSIS — F02818 Dementia in other diseases classified elsewhere, unspecified severity, with other behavioral disturbance: Secondary | ICD-10-CM | POA: Diagnosis not present

## 2024-02-12 DIAGNOSIS — B20 Human immunodeficiency virus [HIV] disease: Secondary | ICD-10-CM | POA: Diagnosis not present

## 2024-02-12 LAB — CBC WITH DIFFERENTIAL/PLATELET
Abs Immature Granulocytes: 0.01 10*3/uL (ref 0.00–0.07)
Basophils Absolute: 0 10*3/uL (ref 0.0–0.1)
Basophils Relative: 1 %
Eosinophils Absolute: 0.7 10*3/uL — ABNORMAL HIGH (ref 0.0–0.5)
Eosinophils Relative: 15 %
HCT: 26.8 % — ABNORMAL LOW (ref 36.0–46.0)
Hemoglobin: 8.8 g/dL — ABNORMAL LOW (ref 12.0–15.0)
Immature Granulocytes: 0 %
Lymphocytes Relative: 47 %
Lymphs Abs: 2.2 10*3/uL (ref 0.7–4.0)
MCH: 28.1 pg (ref 26.0–34.0)
MCHC: 32.8 g/dL (ref 30.0–36.0)
MCV: 85.6 fL (ref 80.0–100.0)
Monocytes Absolute: 0.5 10*3/uL (ref 0.1–1.0)
Monocytes Relative: 10 %
Neutro Abs: 1.2 10*3/uL — ABNORMAL LOW (ref 1.7–7.7)
Neutrophils Relative %: 27 %
Platelets: 223 10*3/uL (ref 150–400)
RBC: 3.13 MIL/uL — ABNORMAL LOW (ref 3.87–5.11)
RDW: 15.5 % (ref 11.5–15.5)
WBC: 4.7 10*3/uL (ref 4.0–10.5)
nRBC: 0 % (ref 0.0–0.2)

## 2024-02-12 LAB — MISC LABCORP TEST (SEND OUT): Labcorp test code: 183764

## 2024-02-12 LAB — BASIC METABOLIC PANEL
Anion gap: 7 (ref 5–15)
BUN: 20 mg/dL (ref 6–20)
CO2: 24 mmol/L (ref 22–32)
Calcium: 9.5 mg/dL (ref 8.9–10.3)
Chloride: 105 mmol/L (ref 98–111)
Creatinine, Ser: 0.75 mg/dL (ref 0.44–1.00)
GFR, Estimated: >60 mL/min (ref >60)
Glucose, Bld: 85 mg/dL (ref 70–99)
Potassium: 3.9 mmol/L (ref 3.5–5.1)
Sodium: 136 mmol/L (ref 135–145)

## 2024-02-12 NOTE — Progress Notes (Signed)
 Physical Therapy Treatment Patient Details Name: Meghan Jarvis MRN: [id number] DOB: 08-Jan-1976 Today's Date: 02/12/2024   History of Present Illness Pt is a 48 year old female admitted with [facility] with acute hypoxic respiratory failure, severe sepsis, community acquired PNA acute metabolic encephalopathy; She was intubated, extubated 12/30    PMH significant for alcohol abuse, cocaine abuse, homelessness, AIDS, atrial fibrillation, hypothyroidism, type 2 diabetes mellitus.  Re-eval 2/26 for "new onset" R hip pain.    PT Comments  Pt was independently sitting EOB watching TV upon arrival. She is agreeable to session and remains cooperative. Still endorsing R hip pain that greatly impacts pt's abilities. She is still however able to stand and ambulate with RW with no physical assistance. Session progressed to ambulation with HHA +1 then to ambulation without AD or UE support. Pt tends to have poor gait safety without UE support. Continued encouragement to use RW at all times until R hip pain improves. Acute PT will continue to follow per current POC progressing as able per pt tolerance.     If plan is discharge home, recommend the following: A little help with walking and/or transfers;A little help with bathing/dressing/bathroom;Assistance with cooking/housework;Direct supervision/assist for medications management;Direct supervision/assist for financial management;Assist for transportation;Help with stairs or ramp for entrance;Supervision due to cognitive status     Equipment Recommendations  Rolling walker (2 wheels)       Precautions / Restrictions Precautions Precautions: Fall Recall of Precautions/Restrictions: Intact Restrictions Weight Bearing Restrictions Per Provider Order: No     Mobility  Bed Mobility Overal bed mobility: Independent  General bed mobility comments: pt was independently seated EOB upon arrival.    Transfers Overall transfer level: Modified independent Equipment  used: None, Rolling walker (2 wheels) Transfers: Sit to/from Stand  General transfer comment: pt was encouraged to continue to use rW when not with staff member    Ambulation/Gait Ambulation/Gait assistance: Supervision, Contact guard assist Gait Distance (Feet): 200 Feet Assistive device: Rolling walker (2 wheels), None, 1 person hand held assist Gait Pattern/deviations: Step-through pattern, Antalgic Gait velocity: WNL  General Gait Details: pt ambulated with RW (supervision), then with +1 HHA, progressing to no UE support. R hip pain most limiting however no LOB or safety concerns with ambulation with RW. Encouraged pt and staff to continue to use RW until R hip pain improves    Balance Overall balance assessment: Needs assistance Sitting-balance support: Feet supported Sitting balance-Leahy Scale: Good     Standing balance support: Bilateral upper extremity supported Standing balance-Leahy Scale: Good Standing balance comment: good with BUE support     Communication Communication Communication: No apparent difficulties  Cognition Arousal: Alert Behavior During Therapy: WFL for tasks assessed/performed   PT - Cognitive impairments: No apparent impairments    PT - Cognition Comments: pt is A and agreeable. Does continue to endorse R hip pain however able to perform all desired task requested of her. Following commands: Intact      Cueing Cueing Techniques: Verbal cues         Pertinent Vitals/Pain Pain Assessment Pain Assessment: 0-10 Pain Score: 6  Pain Location: R hip, right side of lower back - pt has been complaining of this, generally for weeks - did not indicate severe pain functionally (no limp, minimal grimacing, etc) but endorses severe pain subjectively Pain Descriptors / Indicators: Discomfort Pain Intervention(s): Limited activity within patient's tolerance, Monitored during session, Premedicated before session, Repositioned     PT Goals (current goals  can now be  found in the care plan section) Acute Rehab PT Goals Patient Stated Goal: to go home Progress towards PT goals: Progressing toward goals    Frequency    Min 1X/week           Co-evaluation     PT goals addressed during session: Mobility/safety with mobility;Balance        AM-PAC PT "6 Clicks" Mobility   Outcome Measure  Help needed turning from your back to your side while in a flat bed without using bedrails?: None Help needed moving from lying on your back to sitting on the side of a flat bed without using bedrails?: None Help needed moving to and from a bed to a chair (including a wheelchair)?: A Little Help needed standing up from a chair using your arms (e.g., wheelchair or bedside chair)?: A Little Help needed to walk in hospital room?: A Little Help needed climbing 3-5 steps with a railing? : A Little 6 Click Score: 20    End of Session   Activity Tolerance: Patient tolerated treatment well Patient left:  (seated EOB like prior to session) Nurse Communication: Mobility status PT Visit Diagnosis: Other abnormalities of gait and mobility (R26.89);Difficulty in walking, not elsewhere classified (R26.2);Muscle weakness (generalized) (M62.81)     Time: 0981-1914 PT Time Calculation (min) (ACUTE ONLY): 13 min  Charges:    $Gait Training: 8-22 mins PT General Charges $$ ACUTE PT VISIT: 1 Visit                     Jetta Lout PTA 02/12/24, 1:54 PM

## 2024-02-12 NOTE — Plan of Care (Signed)
  Problem: Coping: Goal: Ability to adjust to condition or change in health will improve Outcome: Progressing   Problem: Fluid Volume: Goal: Ability to maintain a balanced intake and output will improve Outcome: Progressing   Problem: Health Behavior/Discharge Planning: Goal: Ability to identify and utilize available resources and services will improve Outcome: Progressing Goal: Ability to manage health-related needs will improve Outcome: Progressing   Problem: Metabolic: Goal: Ability to maintain appropriate glucose levels will improve Outcome: Progressing   Problem: Nutritional: Goal: Maintenance of adequate nutrition will improve Outcome: Progressing Goal: Progress toward achieving an optimal weight will improve Outcome: Progressing   Problem: Skin Integrity: Goal: Risk for impaired skin integrity will decrease Outcome: Progressing   Problem: Tissue Perfusion: Goal: Adequacy of tissue perfusion will improve Outcome: Progressing   Problem: Education: Goal: Knowledge of General Education information will improve Description: Including pain rating scale, medication(s)/side effects and non-pharmacologic comfort measures Outcome: Progressing   Problem: Health Behavior/Discharge Planning: Goal: Ability to manage health-related needs will improve Outcome: Progressing   Problem: Clinical Measurements: Goal: Ability to maintain clinical measurements within normal limits will improve Outcome: Progressing Goal: Will remain free from infection Outcome: Progressing Goal: Diagnostic test results will improve Outcome: Progressing Goal: Respiratory complications will improve Outcome: Progressing Goal: Cardiovascular complication will be avoided Outcome: Progressing   Problem: Activity: Goal: Risk for activity intolerance will decrease Outcome: Progressing   Problem: Nutrition: Goal: Adequate nutrition will be maintained Outcome: Progressing   Problem: Coping: Goal:  Level of anxiety will decrease Outcome: Progressing   Problem: Elimination: Goal: Will not experience complications related to bowel motility Outcome: Progressing Goal: Will not experience complications related to urinary retention Outcome: Progressing   Problem: Pain Management: Goal: General experience of comfort will improve Outcome: Progressing   Problem: Safety: Goal: Ability to remain free from injury will improve Outcome: Progressing   Problem: Skin Integrity: Goal: Risk for impaired skin integrity will decrease Outcome: Progressing   Problem: Activity: Goal: Ability to tolerate increased activity will improve Outcome: Progressing   Problem: Respiratory: Goal: Ability to maintain a clear airway and adequate ventilation will improve Outcome: Progressing

## 2024-02-12 NOTE — Progress Notes (Signed)
 Progress Note   Patient: Meghan Jarvis YNW:[id number] DOB: [birth year] DOA: 12/08/2023     66 DOS: the patient was seen and examined on 02/12/2024   Brief hospital course: 48yo with h/o polysubstance (ETOH, cocaine) abuse, afib, homelessness, AIDS, hypothyroidism, and DM who presented on 12/08/23 with acute respiratory failure.  She was found down by a friend and could not wake her up.  She was found to be hypoxic in the 70s. Admitted to ICU, intubated, started on antibiotics for pneumonia.  12/31: extubated. ID consulted.  MRI brain with advanced cerebral atrophy.  Psych evaluated - "Patient is lacking and reasoning in her medical care for example she is unable to describe the consequence that she will have if she does not take her medications or has complications with her chronic conditions" and needs assistance with housing and finances due to cognitive decline.  APS/DSS pending guardianship and will need placement.    Assessment and Plan:  Note: Patient is medically stable and awaiting DSS guardianship for placement.     HIV Associated Neurocognitive Disorder Mental status improved from admission but patient is a poor historian and does not have capacity to make decisions at this time per psychiatry DSS referral by [facility] MRI did not show any enhancing lesions but did show severe cerebral atrophy - likely HIV associated neurocognitive disorder DSS is working on obtaining guardianship Patient should not be discharged prior to DSS approval per hospital leadership The patient is currently unsure what her discharge plan will be (generally names different family members daily, but none today)   Pain Has complained of back, knee, hip, and abdominal pain during hospitalization Exam nonspecific Neg XR L hip, lumbar spine, R knee, CT A/P, MR lumbar and thoracic spine, L hand/wrist, R elbow, L shoulder, CT C spine PT/OT consulted and suggest home PT vs. 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  Viral load 2 million, CD4 14 on admission Infectious disease following, started Biktarvy Placed on PCP and MAI prophylaxis with atovaquone and Zithromax Toxoplasma antibody IgG came back elevated but MRI of the brain does not show any enhancing lesions Repeat viral load down to 34,300 Infectious disease on board and case discussed   Pancytopenia  Secondary to HIV Platelet count and WBC have normalized Hgb is stable at last check   Cocaine abuse  Patient has been counseled on cessation of cocaine use   Alcohol abuse Continue thiamine, multivitamin, and folic acid         Consultants: ID Psychiatry PT OT Nutrition TOC team   Procedures: None   Antibiotics: Unasyn x 1 dose Ceftriaxone x 1 dose Augmentin x 2 doses Zosyn x 7 doses Bactrim 12/31-1/13 Azithromycin x 3 doses and now weekly   30 Day Unplanned Readmission Risk Score    Flowsheet Row ED to Hosp-Admission (Current) from 12[facility]08/2023 in [facility]  30 Day Unplanned Readmission Risk Score (%) 33.48 Filed at 02/12/2024 0801       This score is the patient's risk of an unplanned readmission within 30 days of being discharged (0 -100%). The score is based on dignosis, age, lab data, medications, orders, and past utilization.   Low:  0-14.9   Medium: 15-21.9   High: 22-29.9   Extreme: 30 and above           Subjective: Lying in bed with her boyfriend.  Complains  of leg pain and edema.   Objective: Vitals:   02/11/24 2000 02/12/24 0400  BP: 102/68 93/68  Pulse: 90 91  Resp: 16 16  Temp: 98.3 F (36.8 C) 98.3 F (36.8 C)  SpO2: 94% 96%    Intake/Output Summary (Last 24 hours) at 02/12/2024 0819 Last data filed at 02/12/2024 0400 Gross per 24 hour  Intake 950 ml  Output --  Net 950 ml   Filed Weights   02/08/24 0331 02/11/24 0352 02/12/24 0400   Weight: 71.6 kg 70.1 kg 72.2 kg    Exam:  General:  Appears calm and comfortable and is in NAD Eyes:   EOMI, normal lids, iris ENT:  grossly normal hearing, lips & tongue, mmm Cardiovascular:  RRR. No LE edema.  Respiratory:   CTA bilaterally with no wheezes/rales/rhonchi.  Normal respiratory effort. Abdomen:  soft, NT, ND Skin:  no rash or induration seen on limited exam Musculoskeletal:  grossly normal tone BUE/BLE, good ROM, no bony abnormality Psychiatric:  blunted mood and affect, speech fluent and appropriate, AOx3 Neurologic:  CN 2-12 grossly intact, moves all extremities in coordinated fashion  Data Reviewed: I have reviewed the patient's lab results since admission.  Pertinent labs for today include:   Normal BMP Stable CBC     Family Communication: Boyfriend was present  Disposition: Status is: Inpatient Remains inpatient appropriate because: unsafe disposition     Time spent: 25 minutes  Unresulted Labs (From admission, onward)     Start     Ordered   01/02/24 0500  Miscellaneous [facility] test (send-out)  Tomorrow morning,   R       Question:  Test name / description:  Answer:  [id number] Acid-fast (Mycobacteria) Smear and Culture With Reflex to Identification and Susceptibility Testing   01/01/24 1415   12/31/23 1134  Fungus culture, blood  Once,   R       Question:  Patient immune status  Answer:  Immunocompromised   12/31/23 1133             Author: Jonah Blue, MD 02/12/2024 8:19 AM  For on call review [email or web address].

## 2024-02-12 NOTE — TOC Progression Note (Signed)
 Transition of Care [facility]) - Progression Note    Patient Details  Name: Meghan Jarvis MRN: [id number] Date of Birth: 06-28-1976  Transition of [facility]) CM/SW Contact  Chapman Fitch, RN Phone Number: 02/12/2024, 4:33 PM  Clinical Narrative:      Per Madilyn Hook with DSS, she has left "sister" [name] a message.  At this time DSS continues to plan to obtain guardianship.      Expected Discharge Plan and Services         Expected Discharge Date: 01/10/24                                     Social Determinants of Health (SDOH) Interventions SDOH Screenings   Food Insecurity: Patient Unable To Answer (12/09/2023)  Recent Concern: Food Insecurity - Food Insecurity Present (09/26/2023)  Housing: Patient Unable To Answer (12/09/2023)  Recent Concern: Housing - Medium Risk (09/26/2023)  Transportation Needs: Patient Unable To Answer (12/09/2023)  Recent Concern: Transportation Needs - Unmet Transportation Needs (10/11/2023)  Utilities: Patient Unable To Answer (12/09/2023)  Recent Concern: Utilities - At Risk (09/25/2023)  Tobacco Use: High Risk (12/08/2023)    Readmission Risk Interventions     No data to display

## 2024-02-13 DIAGNOSIS — B20 Human immunodeficiency virus [HIV] disease: Secondary | ICD-10-CM | POA: Diagnosis not present

## 2024-02-13 DIAGNOSIS — F02818 Dementia in other diseases classified elsewhere, unspecified severity, with other behavioral disturbance: Secondary | ICD-10-CM | POA: Diagnosis not present

## 2024-02-13 NOTE — Plan of Care (Signed)
  Problem: Coping: Goal: Ability to adjust to condition or change in health will improve Outcome: Progressing   Problem: Fluid Volume: Goal: Ability to maintain a balanced intake and output will improve Outcome: Progressing   Problem: Health Behavior/Discharge Planning: Goal: Ability to identify and utilize available resources and services will improve Outcome: Progressing Goal: Ability to manage health-related needs will improve Outcome: Progressing   Problem: Metabolic: Goal: Ability to maintain appropriate glucose levels will improve Outcome: Progressing   Problem: Nutritional: Goal: Maintenance of adequate nutrition will improve Outcome: Progressing Goal: Progress toward achieving an optimal weight will improve Outcome: Progressing   Problem: Skin Integrity: Goal: Risk for impaired skin integrity will decrease Outcome: Progressing   Problem: Tissue Perfusion: Goal: Adequacy of tissue perfusion will improve Outcome: Progressing   Problem: Education: Goal: Knowledge of General Education information will improve Description: Including pain rating scale, medication(s)/side effects and non-pharmacologic comfort measures Outcome: Progressing   Problem: Health Behavior/Discharge Planning: Goal: Ability to manage health-related needs will improve Outcome: Progressing   Problem: Clinical Measurements: Goal: Ability to maintain clinical measurements within normal limits will improve Outcome: Progressing Goal: Will remain free from infection Outcome: Progressing Goal: Diagnostic test results will improve Outcome: Progressing Goal: Respiratory complications will improve Outcome: Progressing Goal: Cardiovascular complication will be avoided Outcome: Progressing   Problem: Activity: Goal: Risk for activity intolerance will decrease Outcome: Progressing   Problem: Nutrition: Goal: Adequate nutrition will be maintained Outcome: Progressing   Problem: Coping: Goal:  Level of anxiety will decrease Outcome: Progressing   Problem: Elimination: Goal: Will not experience complications related to bowel motility Outcome: Progressing Goal: Will not experience complications related to urinary retention Outcome: Progressing   Problem: Pain Management: Goal: General experience of comfort will improve Outcome: Progressing   Problem: Safety: Goal: Ability to remain free from injury will improve Outcome: Progressing   Problem: Skin Integrity: Goal: Risk for impaired skin integrity will decrease Outcome: Progressing   Problem: Activity: Goal: Ability to tolerate increased activity will improve Outcome: Progressing   Problem: Respiratory: Goal: Ability to maintain a clear airway and adequate ventilation will improve Outcome: Progressing

## 2024-02-13 NOTE — Progress Notes (Signed)
 PROGRESS NOTE    Meghan Jarvis   LOV:[id number] DOB: 1976/07/31  DOA: 12/08/2023 Date of Service: 02/13/24 which is hospital day 20  PCP: [facility], [facility] course / significant events:  48yo with h/o polysubstance (ETOH, cocaine) abuse, afib, homelessness, AIDS, hypothyroidism, and DM who presented on 12/08/23 with acute respiratory failure.  She was found down by a friend and could not wake her up.  She was found to be hypoxic in the 70s. Admitted to ICU, intubated, started on antibiotics for pneumonia.  12/31: extubated. ID consulted.  MRI brain with advanced cerebral atrophy.  Psych evaluated - "Patient is lacking and reasoning in her medical care for example she is unable to describe the consequence that she will have if she does not take her medications or has complications with her chronic conditions" and needs assistance with housing and finances due to cognitive decline.  APS/DSS pending guardianship and will need placement.   Consultants:  Axis disease Psychiatry  Procedures/Surgeries: none      ASSESSMENT & PLAN:   Note: Patient is medically stable and awaiting DSS guardianship for placement.     HIV Associated Neurocognitive Disorder Mental status improved from admission but patient is a poor historian and does not have capacity to make decisions at this time per psychiatry DSS referral by [facility] MRI did not show any enhancing lesions but did show severe cerebral atrophy - likely HIV associated neurocognitive disorder DSS is working on obtaining guardianship Patient should not be discharged prior to DSS approval per hospital leadership The patient is currently unsure what her discharge plan will be (generally names different family members daily, but none today)   Pain Has complained of back, knee, hip, and abdominal pain during hospitalization Exam nonspecific Neg XR L hip, lumbar spine, R knee, CT A/P, MR lumbar and thoracic spine, L hand/wrist, R  elbow, L shoulder, CT C spine PT/OT consulted and suggest home PT vs. 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  Viral load 2 million, CD4 14 on admission Infectious disease following, started Biktarvy Placed on PCP and MAI prophylaxis with atovaquone and Zithromax Toxoplasma antibody IgG came back elevated but MRI of the brain does not show any enhancing lesions Repeat viral load down to 34,300 Infectious disease on board    Pancytopenia  Secondary to HIV Platelet count and WBC have normalized Hgb is stable at last check Monitor periodic CBC   Cocaine abuse  Patient has been counseled on cessation of cocaine use   Alcohol abuse Continue thiamine, multivitamin, and folic acid        No concerns based on BMI: Body mass index is 24.17 kg/m.  Underweight - under 18  overweight - 25 to 29 obese - 30 or more Class 1 obesity: BMI of 30.0 to 34 Class 2 obesity: BMI of 35.0 to 39 Class 3 obesity: BMI of 40.0 to 49 Super Morbid Obesity: BMI 50-59 Super-super Morbid Obesity: BMI 60+ Significantly low or high BMI is associated with higher medical risk.  Weight management advised as adjunct to other disease management and risk reduction treatments    DVT prophylaxis: lovenox IV fluids: no continuous IV fluids  Nutrition: regular diet  Central lines / invasive devices: none  Code Status: FULL CODE ACP documentation reviewed:  none on file in [facility]  TOC needs: Follow-up with  DSS Barriers to dispo / significant pending items: Guardianship              Subjective / Brief ROS:  Patient reports no concerns at this time, asks when she will be leaving Denies CP/SOB.    Family Communication: Family at bedside on rounds    Objective Findings:  Vitals:   02/12/24 2132 02/13/24 0402 02/13/24 0713 02/13/24 0720   BP: 96/70 101/78  109/74  Pulse: 88 97  100  Resp: 17 20  16   Temp: 98.2 F (36.8 C) 98.3 F (36.8 C)  98.4 F (36.9 C)  TempSrc: Axillary   Oral  SpO2: 100% 100%  100%  Weight:   72.1 kg   Height:        Intake/Output Summary (Last 24 hours) at 02/13/2024 1449 Last data filed at 02/13/2024 1027 Gross per 24 hour  Intake 0 ml  Output --  Net 0 ml   Filed Weights   02/11/24 0352 02/12/24 0400 02/13/24 0713  Weight: 70.1 kg 72.2 kg 72.1 kg    Examination:  Physical Exam Constitutional:      General: She is not in acute distress. Cardiovascular:     Rate and Rhythm: Normal rate and regular rhythm.  Pulmonary:     Effort: Pulmonary effort is normal.     Breath sounds: Normal breath sounds.  Neurological:     General: No focal deficit present.     Mental Status: She is alert. Mental status is at baseline.  Psychiatric:        Mood and Affect: Mood normal.        Behavior: Behavior normal.          Scheduled Medications:   atovaquone  1,500 mg Oral Q breakfast   azithromycin  1,200 mg Oral Weekly   bictegravir-emtricitabine-tenofovir AF  1 tablet Oral Daily   docusate sodium  100 mg Oral BID   DULoxetine  30 mg Oral QHS   enoxaparin (LOVENOX) injection  40 mg Subcutaneous QHS   feeding supplement  237 mL Oral QID   folic acid  1 mg Oral Daily   hydrocerin   Topical BID   lidocaine  1 patch Transdermal Q24H   midodrine  10 mg Oral TID WC   multivitamin with minerals  1 tablet Oral QHS   pantoprazole  40 mg Oral BID   thiamine  100 mg Oral Daily    Continuous Infusions:   PRN Medications:  acetaminophen, cyclobenzaprine, dicyclomine, diphenhydrAMINE, hydrocortisone, ipratropium-albuterol, lactulose, nicotine polacrilex, ondansetron, mouth rinse, oxyCODONE, polyethylene glycol  Antimicrobials from admission:  Anti-infectives (From admission, onward)    Start     Dose/Rate Route Frequency Ordered Stop   01/25/24 0000  azithromycin (ZITHROMAX) 600 MG  tablet        1,200 mg Oral Weekly 01/25/24 1610     01/25/24 0000  bictegravir-emtricitabine-tenofovir AF (BIKTARVY) 50-200-25 MG TABS tablet        1 tablet Oral Daily 01/25/24 1610     01/25/24 0000  atovaquone (MEPRON) 750 MG/5ML suspension        1,500 mg Oral Daily with breakfast 01/25/24 1610     12/25/23 0800  atovaquone (MEPRON) 750 MG/5ML suspension 1,500 mg        1,500 mg Oral Daily with breakfast 12/24/23 1558     12/24/23 1000  sulfamethoxazole-trimethoprim (BACTRIM) 400-80 MG per tablet 1 tablet  Status:  Discontinued        1 tablet Oral Daily 12/24/23 0749  12/24/23 1557   12/19/23 1400  azithromycin (ZITHROMAX) tablet 1,200 mg        1,200 mg Oral Weekly 12/18/23 1346     12/18/23 1000  sulfamethoxazole-trimethoprim (BACTRIM DS) 800-160 MG per tablet 1 tablet  Status:  Discontinued        1 tablet Oral Daily 12/17/23 1621 12/24/23 0748   12/14/23 1530  bictegravir-emtricitabine-tenofovir AF (BIKTARVY) 50-200-25 MG per tablet 1 tablet        1 tablet Oral Daily 12/14/23 1442     12/12/23 1000  amoxicillin-clavulanate (AUGMENTIN) 875-125 MG per tablet 1 tablet        1 tablet Oral Every 12 hours 12/11/23 1345 12/12/23 2131   12/12/23 1000  azithromycin (ZITHROMAX) tablet 1,250 mg  Status:  Discontinued        1,200 mg Oral Weekly 12/11/23 1618 12/18/23 1346   12/11/23 2000  sulfamethoxazole-trimethoprim (BACTRIM) 400-80 MG per tablet 1 tablet  Status:  Discontinued        1 tablet Oral Daily 12/11/23 1618 12/17/23 1621   12/09/23 2200  azithromycin (ZITHROMAX) 500 mg in sodium chloride 0.9 % 250 mL IVPB  Status:  Discontinued        500 mg 250 mL/hr over 60 Minutes Intravenous Every 24 hours 12/09/23 0734 12/11/23 1618   12/09/23 2000  cefTRIAXone (ROCEPHIN) 2 g in sodium chloride 0.9 % 100 mL IVPB  Status:  Discontinued        2 g 200 mL/hr over 30 Minutes Intravenous Every 24 hours 12/09/23 0953 12/09/23 1840   12/09/23 2000  piperacillin-tazobactam (ZOSYN) IVPB 3.375 g         3.375 g 12.5 mL/hr over 240 Minutes Intravenous Every 8 hours 12/09/23 1850 12/12/23 0037   12/09/23 0600  Ampicillin-Sulbactam (UNASYN) 3 g in sodium chloride 0.9 % 100 mL IVPB  Status:  Discontinued        3 g 200 mL/hr over 30 Minutes Intravenous Every 6 hours 12/09/23 0442 12/09/23 0953   12/08/23 2215  cefTRIAXone (ROCEPHIN) 2 g in sodium chloride 0.9 % 100 mL IVPB        2 g 200 mL/hr over 30 Minutes Intravenous Once 12/08/23 2214 12/08/23 2308   12/08/23 2215  azithromycin (ZITHROMAX) 500 mg in sodium chloride 0.9 % 250 mL IVPB        500 mg 250 mL/hr over 60 Minutes Intravenous  Once 12/08/23 2214 12/09/23 0000           Data Reviewed:  I have personally reviewed the following...  CBC: Recent Labs  Lab 02/12/24 0509  WBC 4.7  NEUTROABS 1.2*  HGB 8.8*  HCT 26.8*  MCV 85.6  PLT 223   Basic Metabolic Panel: Recent Labs  Lab 02/12/24 0509  NA 136  K 3.9  CL 105  CO2 24  GLUCOSE 85  BUN 20  CREATININE 0.75  CALCIUM 9.5   GFR: Estimated Creatinine Clearance: 85.8 mL/min (by C-G formula based on SCr of 0.75 mg/dL). Liver Function Tests: No results for input(s): "AST", "ALT", "ALKPHOS", "BILITOT", "PROT", "ALBUMIN" in the last 168 hours. No results for input(s): "LIPASE", "AMYLASE" in the last 168 hours. No results for input(s): "AMMONIA" in the last 168 hours. Coagulation Profile: No results for input(s): "INR", "PROTIME" in the last 168 hours. Cardiac Enzymes: No results for input(s): "CKTOTAL", "CKMB", "CKMBINDEX", "TROPONINI" in the last 168 hours. BNP (last 3 results) No results for input(s): "PROBNP" in the last 8760 hours. HbA1C: No results for input(s): "  HGBA1C" in the last 72 hours. CBG: No results for input(s): "GLUCAP" in the last 168 hours. Lipid Profile: No results for input(s): "CHOL", "HDL", "LDLCALC", "TRIG", "CHOLHDL", "LDLDIRECT" in the last 72 hours. Thyroid Function Tests: No results for input(s): "TSH", "T4TOTAL", "FREET4",  "T3FREE", "THYROIDAB" in the last 72 hours. Anemia Panel: No results for input(s): "VITAMINB12", "FOLATE", "FERRITIN", "TIBC", "IRON", "RETICCTPCT" in the last 72 hours. Most Recent Urinalysis On File:     Component Value Date/Time   COLORURINE YELLOW (A) 01/21/2024 1215   APPEARANCEUR HAZY (A) 01/21/2024 1215   APPEARANCEUR Clear 08/18/2014 1354   LABSPEC 1.023 01/21/2024 1215   LABSPEC 1.020 08/18/2014 1354   PHURINE 7.0 01/21/2024 1215   GLUCOSEU NEGATIVE 01/21/2024 1215   GLUCOSEU 50 mg/dL 09/81/1914 7829   HGBUR NEGATIVE 01/21/2024 1215   BILIRUBINUR NEGATIVE 01/21/2024 1215   BILIRUBINUR Negative 08/18/2014 1354   KETONESUR NEGATIVE 01/21/2024 1215   PROTEINUR NEGATIVE 01/21/2024 1215   NITRITE NEGATIVE 01/21/2024 1215   LEUKOCYTESUR MODERATE (A) 01/21/2024 1215   LEUKOCYTESUR Negative 08/18/2014 1354   Sepsis Labs: @LABRCNTIP (procalcitonin:4,lacticidven:4) Microbiology: No results found for this or any previous visit (from the past 240 hours).    Radiology Studies last 3 days: No results found.     Time spent: 25  min    Sunnie Nielsen, DO [facility] 02/13/2024, 2:49 PM    Dictation software may have been used to generate the above note. Typos may occur and escape review in typed/dictated notes. Please contact Dr Lyn Hollingshead directly for clarity if needed.  Staff may message me via secure chat in Epic  but this may not receive an immediate response,  please page me for urgent matters!  If 7PM-7AM, please contact night coverage [email or web address]

## 2024-02-14 DIAGNOSIS — B20 Human immunodeficiency virus [HIV] disease: Secondary | ICD-10-CM | POA: Diagnosis not present

## 2024-02-14 DIAGNOSIS — F02818 Dementia in other diseases classified elsewhere, unspecified severity, with other behavioral disturbance: Secondary | ICD-10-CM | POA: Diagnosis not present

## 2024-02-14 NOTE — Plan of Care (Signed)
 Problem: Coping: Goal: Ability to adjust to condition or change in health will improve 02/14/2024 1302 by Sheliah Hatch, RN Outcome: Progressing 02/14/2024 1302 by Sheliah Hatch, RN Outcome: Adequate for Discharge   Problem: Fluid Volume: Goal: Ability to maintain a balanced intake and output will improve 02/14/2024 1302 by Sheliah Hatch, RN Outcome: Progressing 02/14/2024 1302 by Sheliah Hatch, RN Outcome: Adequate for Discharge   Problem: Health Behavior/Discharge Planning: Goal: Ability to identify and utilize available resources and services will improve 02/14/2024 1302 by Sheliah Hatch, RN Outcome: Progressing 02/14/2024 1302 by Sheliah Hatch, RN Outcome: Adequate for Discharge Goal: Ability to manage health-related needs will improve 02/14/2024 1302 by Sheliah Hatch, RN Outcome: Progressing 02/14/2024 1302 by Sheliah Hatch, RN Outcome: Adequate for Discharge   Problem: Metabolic: Goal: Ability to maintain appropriate glucose levels will improve 02/14/2024 1302 by Sheliah Hatch, RN Outcome: Progressing 02/14/2024 1302 by Sheliah Hatch, RN Outcome: Adequate for Discharge   Problem: Nutritional: Goal: Maintenance of adequate nutrition will improve 02/14/2024 1302 by Sheliah Hatch, RN Outcome: Progressing 02/14/2024 1302 by Sheliah Hatch, RN Outcome: Adequate for Discharge Goal: Progress toward achieving an optimal weight will improve 02/14/2024 1302 by Sheliah Hatch, RN Outcome: Progressing 02/14/2024 1302 by Sheliah Hatch, RN Outcome: Adequate for Discharge   Problem: Skin Integrity: Goal: Risk for impaired skin integrity will decrease 02/14/2024 1302 by Sheliah Hatch, RN Outcome: Progressing 02/14/2024 1302 by Sheliah Hatch, RN Outcome: Adequate for Discharge   Problem: Tissue Perfusion: Goal: Adequacy of tissue perfusion will improve 02/14/2024 1302 by Sheliah Hatch, RN Outcome: Progressing 02/14/2024 1302 by Sheliah Hatch, RN Outcome: Adequate for Discharge   Problem: Education: Goal: Knowledge of General Education information will improve Description: Including pain rating scale, medication(s)/side effects and non-pharmacologic comfort measures 02/14/2024 1302 by Sheliah Hatch, RN Outcome: Progressing 02/14/2024 1302 by Sheliah Hatch, RN Outcome: Adequate for Discharge   Problem: Health Behavior/Discharge Planning: Goal: Ability to manage health-related needs will improve 02/14/2024 1302 by Sheliah Hatch, RN Outcome: Progressing 02/14/2024 1302 by Sheliah Hatch, RN Outcome: Adequate for Discharge   Problem: Clinical Measurements: Goal: Ability to maintain clinical measurements within normal limits will improve 02/14/2024 1302 by Sheliah Hatch, RN Outcome: Progressing 02/14/2024 1302 by Sheliah Hatch, RN Outcome: Adequate for Discharge Goal: Will remain free from infection 02/14/2024 1302 by Sheliah Hatch, RN Outcome: Progressing 02/14/2024 1302 by Sheliah Hatch, RN Outcome: Adequate for Discharge Goal: Diagnostic test results will improve 02/14/2024 1302 by Sheliah Hatch, RN Outcome: Progressing 02/14/2024 1302 by Sheliah Hatch, RN Outcome: Adequate for Discharge Goal: Respiratory complications will improve 02/14/2024 1302 by Sheliah Hatch, RN Outcome: Progressing 02/14/2024 1302 by Sheliah Hatch, RN Outcome: Adequate for Discharge Goal: Cardiovascular complication will be avoided 02/14/2024 1302 by Sheliah Hatch, RN Outcome: Progressing 02/14/2024 1302 by Sheliah Hatch, RN Outcome: Adequate for Discharge   Problem: Activity: Goal: Risk for activity intolerance will decrease 02/14/2024 1302 by Sheliah Hatch, RN Outcome: Progressing 02/14/2024 1302 by Sheliah Hatch, RN Outcome: Adequate for Discharge   Problem: Nutrition: Goal: Adequate nutrition will be maintained 02/14/2024 1302 by Sheliah Hatch, RN Outcome: Progressing 02/14/2024 1302  by Sheliah Hatch, RN Outcome: Adequate for Discharge   Problem: Coping: Goal: Level of anxiety will decrease 02/14/2024 1302 by Sheliah Hatch, RN Outcome: Progressing 02/14/2024 1302 by Sheliah Hatch, RN Outcome: Adequate for Discharge   Problem: Elimination: Goal: Will not experience complications related to bowel motility 02/14/2024 1302 by Sheliah Hatch, RN Outcome: Progressing 02/14/2024 1302 by Sheliah Hatch, RN Outcome: Adequate for Discharge Goal: Will not experience  complications related to urinary retention 02/14/2024 1302 by Sheliah Hatch, RN Outcome: Progressing 02/14/2024 1302 by Sheliah Hatch, RN Outcome: Adequate for Discharge   Problem: Pain Management: Goal: General experience of comfort will improve 02/14/2024 1302 by Sheliah Hatch, RN Outcome: Progressing 02/14/2024 1302 by Sheliah Hatch, RN Outcome: Adequate for Discharge   Problem: Safety: Goal: Ability to remain free from injury will improve 02/14/2024 1302 by Sheliah Hatch, RN Outcome: Progressing 02/14/2024 1302 by Sheliah Hatch, RN Outcome: Adequate for Discharge   Problem: Skin Integrity: Goal: Risk for impaired skin integrity will decrease 02/14/2024 1302 by Sheliah Hatch, RN Outcome: Progressing 02/14/2024 1302 by Sheliah Hatch, RN Outcome: Adequate for Discharge   Problem: Activity: Goal: Ability to tolerate increased activity will improve 02/14/2024 1302 by Sheliah Hatch, RN Outcome: Progressing 02/14/2024 1302 by Sheliah Hatch, RN Outcome: Adequate for Discharge   Problem: Respiratory: Goal: Ability to maintain a clear airway and adequate ventilation will improve 02/14/2024 1302 by Sheliah Hatch, RN Outcome: Progressing 02/14/2024 1302 by Sheliah Hatch, RN Outcome: Adequate for Discharge

## 2024-02-14 NOTE — Progress Notes (Signed)
 PROGRESS NOTE    Meghan Jarvis   RUE:[id number] DOB: 12-May-1976  DOA: 12/08/2023 Date of Service: 02/14/24 which is hospital day 22  PCP: [facility] course / significant events:  47yo with h/o polysubstance (ETOH, cocaine) abuse, afib, homelessness, AIDS, hypothyroidism, and DM who presented on 12/08/23 with acute respiratory failure.  She was found down by a friend and could not wake her up.  She was found to be hypoxic in the 70s. Admitted to ICU, intubated, started on antibiotics for pneumonia.  12/31: extubated. ID consulted.  MRI brain with advanced cerebral atrophy.  Psych evaluated - "Patient is lacking and reasoning in her medical care for example she is unable to describe the consequence that she will have if she does not take her medications or has complications with her chronic conditions" and needs assistance with housing and finances due to cognitive decline.  APS/DSS pending guardianship and will need placement.   Consultants:  infectious disease Psychiatry  Procedures/Surgeries: none      ASSESSMENT & PLAN:   Note: Patient is medically stable and awaiting DSS guardianship for placement.     HIV Associated Neurocognitive Disorder Mental status improved from admission but patient is a poor historian and does not have capacity to make decisions at this time per psychiatry DSS referral by [facility] MRI did not show any enhancing lesions but did show severe cerebral atrophy - likely HIV associated neurocognitive disorder DSS is working on obtaining guardianship Patient should not be discharged prior to DSS approval per hospital leadership The patient is currently unsure what her discharge plan will be (generally names different family members daily, but none today)   Pain Has complained of back, knee, hip, and abdominal pain during hospitalization Exam nonspecific Neg XR L hip, lumbar spine, R knee, CT A/P, MR lumbar and thoracic spine, L  hand/wrist, R elbow, L shoulder, CT C spine PT/OT consulted and suggest home PT vs. 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  Viral load 2 million, CD4 14 on admission Infectious disease following, started Biktarvy Placed on PCP and MAI prophylaxis with atovaquone and Zithromax Toxoplasma antibody IgG came back elevated but MRI of the brain does not show any enhancing lesions Repeat viral load down to 34,300 Infectious disease on board    Pancytopenia  Secondary to HIV Platelet count and WBC have normalized Hgb is stable at last check Monitor periodic CBC   Cocaine abuse  Patient has been counseled on cessation of cocaine use   Alcohol abuse Continue thiamine, multivitamin, and folic acid        No concerns based on BMI: Body mass index is 24.17 kg/m.  Underweight - under 18  overweight - 25 to 29 obese - 30 or more Class 1 obesity: BMI of 30.0 to 34 Class 2 obesity: BMI of 35.0 to 39 Class 3 obesity: BMI of 40.0 to 49 Super Morbid Obesity: BMI 50-59 Super-super Morbid Obesity: BMI 60+ Significantly low or high BMI is associated with higher medical risk.  Weight management advised as adjunct to other disease management and risk reduction treatments    DVT prophylaxis: lovenox IV fluids: no continuous IV fluids  Nutrition: regular diet  Central lines / invasive devices: none  Code Status: FULL CODE ACP documentation reviewed:  none on file in [facility]  TOC needs: Follow-up with  DSS Barriers to dispo / significant pending items: Guardianship              Subjective / Brief ROS:  Patient reports no concerns at this time, asks when she will be leaving she is upset and wants to go home  Denies CP/SOB.    Family Communication: none at this time     Objective Findings:  Vitals:   02/13/24 1649  02/13/24 2032 02/14/24 0535 02/14/24 0812  BP: 100/63 112/75 100/63 104/83  Pulse: 91 94 (!) 110 (!) 103  Resp: 20 18 20 16   Temp: 98.1 F (36.7 C) 98 F (36.7 C) 98.7 F (37.1 C) (!) 97.4 F (36.3 C)  TempSrc: Oral Oral Oral Oral  SpO2: 100% 100% 99% 98%  Weight:      Height:        Intake/Output Summary (Last 24 hours) at 02/14/2024 1527 Last data filed at 02/14/2024 1100 Gross per 24 hour  Intake 240 ml  Output --  Net 240 ml   Filed Weights   02/11/24 0352 02/12/24 0400 02/13/24 0713  Weight: 70.1 kg 72.2 kg 72.1 kg    Examination:  Physical Exam Constitutional:      General: She is not in acute distress. Cardiovascular:     Rate and Rhythm: Normal rate and regular rhythm.  Pulmonary:     Effort: Pulmonary effort is normal.     Breath sounds: Normal breath sounds.  Neurological:     General: No focal deficit present.     Mental Status: She is alert. Mental status is at baseline.  Psychiatric:        Behavior: Behavior normal.          Scheduled Medications:   atovaquone  1,500 mg Oral Q breakfast   azithromycin  1,200 mg Oral Weekly   bictegravir-emtricitabine-tenofovir AF  1 tablet Oral Daily   docusate sodium  100 mg Oral BID   DULoxetine  30 mg Oral QHS   enoxaparin (LOVENOX) injection  40 mg Subcutaneous QHS   feeding supplement  237 mL Oral QID   folic acid  1 mg Oral Daily   hydrocerin   Topical BID   lidocaine  1 patch Transdermal Q24H   midodrine  10 mg Oral TID WC   multivitamin with minerals  1 tablet Oral QHS   pantoprazole  40 mg Oral BID   thiamine  100 mg Oral Daily    Continuous Infusions:   PRN Medications:  acetaminophen, cyclobenzaprine, dicyclomine, diphenhydrAMINE, hydrocortisone, ipratropium-albuterol, lactulose, nicotine polacrilex, ondansetron, mouth rinse, oxyCODONE, polyethylene glycol  Antimicrobials from admission:  Anti-infectives (From admission, onward)    Start     Dose/Rate Route Frequency Ordered Stop    01/25/24 0000  azithromycin (ZITHROMAX) 600 MG tablet        1,200 mg Oral Weekly 01/25/24 1610     01/25/24 0000  bictegravir-emtricitabine-tenofovir AF (BIKTARVY) 50-200-25 MG TABS tablet        1 tablet Oral Daily 01/25/24 1610     01/25/24 0000  atovaquone (MEPRON) 750 MG/5ML suspension        1,500 mg Oral Daily with breakfast 01/25/24 1610     12/25/23 0800  atovaquone (MEPRON) 750 MG/5ML suspension 1,500 mg        1,500 mg Oral Daily with breakfast 12/24/23 1558     12/24/23 1000  sulfamethoxazole-trimethoprim (BACTRIM) 400-80 MG per tablet 1 tablet  Status:  Discontinued        1 tablet Oral Daily  12/24/23 0749 12/24/23 1557   12/19/23 1400  azithromycin (ZITHROMAX) tablet 1,200 mg        1,200 mg Oral Weekly 12/18/23 1346     12/18/23 1000  sulfamethoxazole-trimethoprim (BACTRIM DS) 800-160 MG per tablet 1 tablet  Status:  Discontinued        1 tablet Oral Daily 12/17/23 1621 12/24/23 0748   12/14/23 1530  bictegravir-emtricitabine-tenofovir AF (BIKTARVY) 50-200-25 MG per tablet 1 tablet        1 tablet Oral Daily 12/14/23 1442     12/12/23 1000  amoxicillin-clavulanate (AUGMENTIN) 875-125 MG per tablet 1 tablet        1 tablet Oral Every 12 hours 12/11/23 1345 12/12/23 2131   12/12/23 1000  azithromycin (ZITHROMAX) tablet 1,250 mg  Status:  Discontinued        1,200 mg Oral Weekly 12/11/23 1618 12/18/23 1346   12/11/23 2000  sulfamethoxazole-trimethoprim (BACTRIM) 400-80 MG per tablet 1 tablet  Status:  Discontinued        1 tablet Oral Daily 12/11/23 1618 12/17/23 1621   12/09/23 2200  azithromycin (ZITHROMAX) 500 mg in sodium chloride 0.9 % 250 mL IVPB  Status:  Discontinued        500 mg 250 mL/hr over 60 Minutes Intravenous Every 24 hours 12/09/23 0734 12/11/23 1618   12/09/23 2000  cefTRIAXone (ROCEPHIN) 2 g in sodium chloride 0.9 % 100 mL IVPB  Status:  Discontinued        2 g 200 mL/hr over 30 Minutes Intravenous Every 24 hours 12/09/23 0953 12/09/23 1840   12/09/23 2000   piperacillin-tazobactam (ZOSYN) IVPB 3.375 g        3.375 g 12.5 mL/hr over 240 Minutes Intravenous Every 8 hours 12/09/23 1850 12/12/23 0037   12/09/23 0600  Ampicillin-Sulbactam (UNASYN) 3 g in sodium chloride 0.9 % 100 mL IVPB  Status:  Discontinued        3 g 200 mL/hr over 30 Minutes Intravenous Every 6 hours 12/09/23 0442 12/09/23 0953   12/08/23 2215  cefTRIAXone (ROCEPHIN) 2 g in sodium chloride 0.9 % 100 mL IVPB        2 g 200 mL/hr over 30 Minutes Intravenous Once 12/08/23 2214 12/08/23 2308   12/08/23 2215  azithromycin (ZITHROMAX) 500 mg in sodium chloride 0.9 % 250 mL IVPB        500 mg 250 mL/hr over 60 Minutes Intravenous  Once 12/08/23 2214 12/09/23 0000           Data Reviewed:  I have personally reviewed the following...  CBC: Recent Labs  Lab 02/12/24 0509  WBC 4.7  NEUTROABS 1.2*  HGB 8.8*  HCT 26.8*  MCV 85.6  PLT 223   Basic Metabolic Panel: Recent Labs  Lab 02/12/24 0509  NA 136  K 3.9  CL 105  CO2 24  GLUCOSE 85  BUN 20  CREATININE 0.75  CALCIUM 9.5   GFR: Estimated Creatinine Clearance: 85.8 mL/min (by C-G formula based on SCr of 0.75 mg/dL). Liver Function Tests: No results for input(s): "AST", "ALT", "ALKPHOS", "BILITOT", "PROT", "ALBUMIN" in the last 168 hours. No results for input(s): "LIPASE", "AMYLASE" in the last 168 hours. No results for input(s): "AMMONIA" in the last 168 hours. Coagulation Profile: No results for input(s): "INR", "PROTIME" in the last 168 hours. Cardiac Enzymes: No results for input(s): "CKTOTAL", "CKMB", "CKMBINDEX", "TROPONINI" in the last 168 hours. BNP (last 3 results) No results for input(s): "PROBNP" in the last 8760 hours. HbA1C: No results  for input(s): "HGBA1C" in the last 72 hours. CBG: No results for input(s): "GLUCAP" in the last 168 hours. Lipid Profile: No results for input(s): "CHOL", "HDL", "LDLCALC", "TRIG", "CHOLHDL", "LDLDIRECT" in the last 72 hours. Thyroid Function Tests: No  results for input(s): "TSH", "T4TOTAL", "FREET4", "T3FREE", "THYROIDAB" in the last 72 hours. Anemia Panel: No results for input(s): "VITAMINB12", "FOLATE", "FERRITIN", "TIBC", "IRON", "RETICCTPCT" in the last 72 hours. Most Recent Urinalysis On File:     Component Value Date/Time   COLORURINE YELLOW (A) 01/21/2024 1215   APPEARANCEUR HAZY (A) 01/21/2024 1215   APPEARANCEUR Clear 08/18/2014 1354   LABSPEC 1.023 01/21/2024 1215   LABSPEC 1.020 08/18/2014 1354   PHURINE 7.0 01/21/2024 1215   GLUCOSEU NEGATIVE 01/21/2024 1215   GLUCOSEU 50 mg/dL 96/03/5408 8119   HGBUR NEGATIVE 01/21/2024 1215   BILIRUBINUR NEGATIVE 01/21/2024 1215   BILIRUBINUR Negative 08/18/2014 1354   KETONESUR NEGATIVE 01/21/2024 1215   PROTEINUR NEGATIVE 01/21/2024 1215   NITRITE NEGATIVE 01/21/2024 1215   LEUKOCYTESUR MODERATE (A) 01/21/2024 1215   LEUKOCYTESUR Negative 08/18/2014 1354   Sepsis Labs: @LABRCNTIP (procalcitonin:4,lacticidven:4) Microbiology: No results found for this or any previous visit (from the past 240 hours).    Radiology Studies last 3 days: No results found.     Time spent: 25  min    Sunnie Nielsen, DO [facility] 02/14/2024, 3:27 PM    Dictation software may have been used to generate the above note. Typos may occur and escape review in typed/dictated notes. Please contact Dr Lyn Hollingshead directly for clarity if needed.  Staff may message me via secure chat in Epic  but this may not receive an immediate response,  please page me for urgent matters!  If 7PM-7AM, please contact night coverage [email or web address]

## 2024-02-14 NOTE — Progress Notes (Signed)
 Mobility Specialist - Progress Note   02/14/24 1613  Mobility  Activity Ambulated with assistance in hallway  Level of Assistance Standby assist, set-up cues, supervision of patient - no hands on  Assistive Device Front wheel walker  Distance Ambulated (ft) 320 ft  Activity Response Tolerated well  Mobility visit 1 Mobility  Mobility Specialist Start Time (ACUTE ONLY) 1534  Mobility Specialist Stop Time (ACUTE ONLY) 1555  Mobility Specialist Time Calculation (min) (ACUTE ONLY) 21 min   Pt standing near the window upon entry, utilizing RA-- no AD. Pt acquires RW and amb two laps around the NS with supervision-- cuing to remain with the RW. Upon return to the room, Pt expressed pain in thighs and BLE as well as chest pain, RN notified. Pt left seated in the recliner with needs within reach.  Zetta Bills Mobility Specialist 02/14/24 4:16 PM

## 2024-02-14 NOTE — Progress Notes (Signed)
 Physical Therapy Treatment Patient Details Name: Meghan Jarvis MRN: [id number] DOB: 23-Feb-1976 Today's Date: 02/14/2024   History of Present Illness Pt is a 48 year old female admitted with [facility] with acute hypoxic respiratory failure, severe sepsis, community acquired PNA acute metabolic encephalopathy; She was intubated, extubated 12/30    PMH significant for alcohol abuse, cocaine abuse, homelessness, AIDS, atrial fibrillation, hypothyroidism, type 2 diabetes mellitus.  Re-eval 2/26 for "new onset" R hip pain.    PT Comments  Pt in hallway without RW and without socks, tech showed pt back to the correct room with this Thereasa Parkin went to go get socks. Seated EOB she was able to don both socks without assistance. modI to transfer with RW, and the pt was able to ambulate ~269ft. Pt without complaints of hip pain, but did complain of bilateral feet swelling, no notable limp observed or overt difficulty with mobility. PT to sign off at this time, please re-consult if needs arise.     If plan is discharge home, recommend the following: Assist for transportation   Can travel by private vehicle     Yes  Equipment Recommendations  Rolling walker (2 wheels)    Recommendations for Other Services       Precautions / Restrictions Precautions Precautions: Fall Recall of Precautions/Restrictions: Intact Restrictions Weight Bearing Restrictions Per Provider Order: No     Mobility  Bed Mobility Overal bed mobility: Independent Bed Mobility: Sit to Supine                Transfers Overall transfer level: Modified independent Equipment used: Rolling walker (2 wheels) Transfers: Sit to/from Stand Sit to Stand: Modified independent (Device/Increase time)                Ambulation/Gait Ambulation/Gait assistance: Modified independent (Device/Increase time) Gait Distance (Feet): 200 Feet Assistive device: Rolling walker (2 wheels)         General Gait Details: pt without limp but  did complain of bilateral feet swelling   Stairs             Wheelchair Mobility     Tilt Bed    Modified Rankin (Stroke Patients Only)       Balance Overall balance assessment: Needs assistance Sitting-balance support: Feet supported Sitting balance-Leahy Scale: Good     Standing balance support: Bilateral upper extremity supported Standing balance-Leahy Scale: Good                              Communication    Cognition Arousal: Alert Behavior During Therapy: WFL for tasks assessed/performed                           PT - Cognition Comments: pt agreeable, joking with therapist, oriented to place and visitor in room but did tend Following commands: Intact Following commands impaired: Follows one step commands with increased time    Cueing    Exercises      General Comments        Pertinent Vitals/Pain Pain Assessment Pain Assessment: Faces Faces Pain Scale: Hurts a little bit Pain Location: references swollen feet Pain Descriptors / Indicators: Grimacing Pain Intervention(s): Limited activity within patient's tolerance, Monitored during session, Repositioned    Home Living                          Prior Function  PT Goals (current goals can now be found in the care plan section) Progress towards PT goals: Goals met/education completed, patient discharged from PT    Frequency     (pt discharged from therapy services)      PT Plan      Co-evaluation              AM-PAC PT "6 Clicks" Mobility   Outcome Measure  Help needed turning from your back to your side while in a flat bed without using bedrails?: None Help needed moving from lying on your back to sitting on the side of a flat bed without using bedrails?: None Help needed moving to and from a bed to a chair (including a wheelchair)?: None Help needed standing up from a chair using your arms (e.g., wheelchair or bedside chair)?:  None Help needed to walk in hospital room?: None Help needed climbing 3-5 steps with a railing? : A Little 6 Click Score: 23    End of Session   Activity Tolerance: Patient tolerated treatment well Patient left: with call bell/phone within reach;in bed Nurse Communication: Mobility status PT Visit Diagnosis: Other abnormalities of gait and mobility (R26.89);Difficulty in walking, not elsewhere classified (R26.2);Muscle weakness (generalized) (M62.81)     Time: 6295-2841 PT Time Calculation (min) (ACUTE ONLY): 8 min  Charges:    $Therapeutic Activity: 8-22 mins PT General Charges $$ ACUTE PT VISIT: 1 Visit                     Olga Coaster PT, DPT 12:48 PM,02/14/24

## 2024-02-14 NOTE — Progress Notes (Addendum)
 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  -Genotype no resistance to NRTI, NNRTI, Integrase inhibitor and PI -Repeat Vl shows a significant drop in 3 weeks from 2 million to 34,000 . 01/15/24 vl is 460. -CD4 is improved significantly from 12 to 416 on 01/09/24 -Toxo IgG >400. TOXO PCR negative.   MRI brain no CNS lesions   Plan: -At risk for IRIS- watch closely  -On  PCP/TOXO  and MAI prophylaxis. (Previously on bactrim for PCP and Toxo prophylaxis- because of new rash concerning for bactrim allergy was changed to atovaquone)->Rash resolved -Beta D glucan high > 500 (fungal antibodies)->observe -QuantiFERON gold indeterminate.  No treatment needed now.  Can repeat it later. Pt denies any respiratory symptoms. CXR  on 12/20/23 no signs of infection.  -On discharge she will have to go on Biktarvy ( 50-200-25 mg) and azithromycin 1200mg  weekly for MAI prophylaxis and Atovaquone for PCP and toxo prophylaxis. DSS working on obtaining guardianship.  -Follow [employer] cx(blood) not performed due to "age of this specimen". Repeat AFB blood cx. -HIV RNA and CD4  #Pain -On review pt has reported multiple joint pain with benign imaging.  Currently on flexneril and bentyl -If pain worsens then consider further advanced imaging.    Evaluation of this patient requires complex antimicrobial therapy evaluation and counseling + isolation needs for disease transmission risk assessment and mitigation    Microbiology:     12/28 BC NG   Toxo IgG > 400 Toxo PCR neg Toxo igM neg RPR NR CMV DNA neg Crypto neg HIV RNA 2 million>> 34, 000 Cd4 is 14 ( 2.4%) repeat on 1/29 is 416 ( 23%) Beta D glucan > 500 (  repeated) Histoplasma neg Fungal antibodies negative Genosure prime- no resistant mutations         SUBJECTIVE: Resting in bed.  Interval: Afebrile overnight  Review of Systems: Review of Systems  All other systems reviewed and are negative.    Scheduled Meds:  atovaquone  1,500 mg Oral Q breakfast   azithromycin  1,200 mg Oral Weekly   bictegravir-emtricitabine-tenofovir AF  1 tablet Oral Daily   docusate sodium  100 mg Oral BID   DULoxetine  30 mg Oral QHS   enoxaparin (LOVENOX) injection  40 mg Subcutaneous QHS   feeding supplement  237 mL Oral QID   folic acid  1 mg Oral Daily   hydrocerin   Topical BID   lidocaine  1 patch Transdermal Q24H   midodrine  10 mg Oral TID WC   multivitamin with minerals  1 tablet Oral QHS   pantoprazole  40 mg Oral BID   thiamine  100 mg Oral Daily   Continuous Infusions: PRN Meds:.acetaminophen, cyclobenzaprine, dicyclomine, diphenhydrAMINE, hydrocortisone, ipratropium-albuterol, lactulose, nicotine polacrilex, ondansetron, mouth  rinse, oxyCODONE, polyethylene glycol Allergies  Allergen Reactions   Fish Allergy Other (See Comments)    Crab legs result in itching  Crab legs result in itching  Crab legs result in itching   Shellfish Allergy Other (See Comments)    Crab legs result in itching   Buprenorphine Hcl     Pt states she not allergic   Morphine And Codeine Itching    hives   Sulfa Antibiotics Rash    OBJECTIVE: Vitals:   02/13/24 1649 02/13/24 2032 02/14/24 0535 02/14/24 0812  BP: 100/63 112/75 100/63 104/83  Pulse: 91 94 (!) 110 (!) 103   Resp: 20 18 20 16   Temp: 98.1 F (36.7 C) 98 F (36.7 C) 98.7 F (37.1 C) (!) 97.4 F (36.3 C)  TempSrc: Oral Oral Oral Oral  SpO2: 100% 100% 99% 98%  Weight:      Height:       Body mass index is 24.17 kg/m.  Physical Exam Constitutional:      Appearance: Normal appearance.  HENT:     Head: Normocephalic and atraumatic.     Right Ear: Tympanic membrane normal.     Left Ear: Tympanic membrane normal.     Nose: Nose normal.     Mouth/Throat:     Mouth: Mucous membranes are moist.  Eyes:     Extraocular Movements: Extraocular movements intact.     Conjunctiva/sclera: Conjunctivae normal.     Pupils: Pupils are equal, round, and reactive to light.  Cardiovascular:     Rate and Rhythm: Normal rate and regular rhythm.     Heart sounds: No murmur heard.    No friction rub. No gallop.  Pulmonary:     Effort: Pulmonary effort is normal.     Breath sounds: Normal breath sounds.  Abdominal:     General: Abdomen is flat.     Palpations: Abdomen is soft.  Musculoskeletal:        General: Normal range of motion.  Skin:    General: Skin is warm and dry.  Neurological:     General: No focal deficit present.     Mental Status: She is alert and oriented to person, place, and time.  Psychiatric:        Mood and Affect: Mood normal.       Lab Results Lab Results  Component Value Date   WBC 4.7 02/12/2024   HGB 8.8 (L) 02/12/2024   HCT 26.8 (L) 02/12/2024   MCV 85.6 02/12/2024   PLT 223 02/12/2024    Lab Results  Component Value Date   CREATININE 0.75 02/12/2024   BUN 20 02/12/2024   NA 136 02/12/2024   K 3.9 02/12/2024   CL 105 02/12/2024   CO2 24 02/12/2024    Lab Results  Component Value Date   ALT 24 01/09/2024   AST 25 01/09/2024   ALKPHOS 43 01/09/2024   BILITOT 0.5 01/09/2024        Danelle Earthly, MD [facility] 02/14/2024, 2:16 PM

## 2024-02-14 NOTE — TOC Progression Note (Signed)
 Transition of Care [facility]) - Progression Note    Patient Details  Name: Meghan Jarvis MRN: [id number] Date of Birth: 07/10/1976  [facility] CM/SW Contact  Chapman Fitch, RN Phone Number: 02/14/2024, 2:23 PM  Clinical Narrative:     DSS guardianship pending       Expected Discharge Plan and Services         Expected Discharge Date: 01/10/24                                     Social Determinants of Health (SDOH) Interventions SDOH Screenings   Food Insecurity: Patient Unable To Answer (12/09/2023)  Recent Concern: Food Insecurity - Food Insecurity Present (09/26/2023)  Housing: Patient Unable To Answer (12/09/2023)  Recent Concern: Housing - Medium Risk (09/26/2023)  Transportation Needs: Patient Unable To Answer (12/09/2023)  Recent Concern: Transportation Needs - Unmet Transportation Needs (10/11/2023)  Utilities: Patient Unable To Answer (12/09/2023)  Recent Concern: Utilities - At Risk (09/25/2023)  Tobacco Use: High Risk (12/08/2023)    Readmission Risk Interventions     No data to display

## 2024-02-15 DIAGNOSIS — B20 Human immunodeficiency virus [HIV] disease: Secondary | ICD-10-CM | POA: Diagnosis not present

## 2024-02-15 DIAGNOSIS — F02818 Dementia in other diseases classified elsewhere, unspecified severity, with other behavioral disturbance: Secondary | ICD-10-CM | POA: Diagnosis not present

## 2024-02-15 LAB — CBC
HCT: 27.6 % — ABNORMAL LOW (ref 36.0–46.0)
Hemoglobin: 9 g/dL — ABNORMAL LOW (ref 12.0–15.0)
MCH: 28 pg (ref 26.0–34.0)
MCHC: 32.6 g/dL (ref 30.0–36.0)
MCV: 86 fL (ref 80.0–100.0)
Platelets: 220 10*3/uL (ref 150–400)
RBC: 3.21 MIL/uL — ABNORMAL LOW (ref 3.87–5.11)
RDW: 15.4 % (ref 11.5–15.5)
WBC: 4.6 10*3/uL (ref 4.0–10.5)
nRBC: 0 % (ref 0.0–0.2)

## 2024-02-15 LAB — HELPER T-LYMPH-CD4 (ARMC ONLY)
% CD 4 Pos. Lymph.: 18.8 % — ABNORMAL LOW (ref 30.8–58.5)
Absolute CD 4 Helper: 301 /uL — ABNORMAL LOW (ref 359–1519)
Basophils Absolute: 0 10*3/uL (ref 0.0–0.2)
Basos: 1 %
EOS (ABSOLUTE): 0.8 10*3/uL — ABNORMAL HIGH (ref 0.0–0.4)
Eos: 20 %
Hematocrit: 27.8 % — ABNORMAL LOW (ref 34.0–46.6)
Hemoglobin: 8.9 g/dL — ABNORMAL LOW (ref 11.1–15.9)
Immature Grans (Abs): 0 10*3/uL (ref 0.0–0.1)
Immature Granulocytes: 0 %
Lymphocytes Absolute: 1.6 10*3/uL (ref 0.7–3.1)
Lymphs: 37 %
MCH: 26.9 pg (ref 26.6–33.0)
MCHC: 32 g/dL (ref 31.5–35.7)
MCV: 84 fL (ref 79–97)
Monocytes Absolute: 0.4 10*3/uL (ref 0.1–0.9)
Monocytes: 9 %
Neutrophils Absolute: 1.3 10*3/uL — ABNORMAL LOW (ref 1.4–7.0)
Neutrophils: 33 %
Platelets: 245 10*3/uL (ref 150–450)
RBC: 3.31 x10E6/uL — ABNORMAL LOW (ref 3.77–5.28)
RDW: 13.7 % (ref 11.7–15.4)
WBC: 4.1 10*3/uL (ref 3.4–10.8)

## 2024-02-15 LAB — HIV-1 RNA QUANT-NO REFLEX-BLD
HIV 1 RNA Quant: 60 {copies}/mL
LOG10 HIV-1 RNA: 1.778 {Log_copies}/mL

## 2024-02-15 NOTE — Progress Notes (Signed)
 PROGRESS NOTE    SANDAR KRINKE   ZOX:[id number] DOB: [birth year]  DOA: 12/08/2023 Date of Service: 02/15/24 which is hospital day 88  PCP: Healthcare, [facility] course / significant events:  48yo with h/o polysubstance (ETOH, cocaine) abuse, afib, homelessness, AIDS, hypothyroidism, and DM who presented on 12/08/23 with acute respiratory failure.  She was found down by a friend and could not wake her up.  She was found to be hypoxic in the 70s. Admitted to ICU, intubated, started on antibiotics for pneumonia.  12/31: extubated. ID consulted.  MRI brain with advanced cerebral atrophy.  Psych evaluated - "Patient is lacking and reasoning in her medical care for example she is unable to describe the consequence that she will have if she does not take her medications or has complications with her chronic conditions" and needs assistance with housing and finances due to cognitive decline.  APS/DSS pending guardianship and will need placement.   Consultants:  infectious disease Psychiatry  Procedures/Surgeries: none      ASSESSMENT & PLAN:   Note: Patient is medically stable and awaiting DSS guardianship for placement.     HIV Associated Neurocognitive Disorder Mental status improved from admission but patient is a poor historian and does not have capacity to make decisions at this time per psychiatry DSS referral by [facility] MRI did not show any enhancing lesions but did show severe cerebral atrophy - likely HIV associated neurocognitive disorder DSS is working on obtaining guardianship Patient should not be discharged prior to DSS approval per hospital leadership The patient is currently unsure what her discharge plan will be (generally names different family members daily, but none today)   Pain Has complained of back, knee, hip, and abdominal pain during hospitalization Exam nonspecific Neg XR L hip, lumbar spine, R knee, CT A/P, MR lumbar and thoracic spine, L  hand/wrist, R elbow, L shoulder, CT C spine PT/OT consulted and suggest home PT vs. 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  Viral load 2 million, CD4 14 on admission Infectious disease following, started Biktarvy Placed on PCP and MAI prophylaxis with atovaquone and Zithromax Toxoplasma antibody IgG came back elevated but MRI of the brain does not show any enhancing lesions Repeat viral load down to 34,300 Infectious disease on board    Pancytopenia  Secondary to HIV Platelet count and WBC have normalized Hgb is stable at last check Monitor periodic CBC   Cocaine abuse  Patient has been counseled on cessation of cocaine use   Alcohol abuse Continue thiamine, multivitamin, and folic acid        No concerns based on BMI: Body mass index is 24.17 kg/m.  Underweight - under 18  overweight - 25 to 29 obese - 30 or more Class 1 obesity: BMI of 30.0 to 34 Class 2 obesity: BMI of 35.0 to 39 Class 3 obesity: BMI of 40.0 to 49 Super Morbid Obesity: BMI 50-59 Super-super Morbid Obesity: BMI 60+ Significantly low or high BMI is associated with higher medical risk.  Weight management advised as adjunct to other disease management and risk reduction treatments    DVT prophylaxis: lovenox IV fluids: no continuous IV fluids  Nutrition: regular diet  Central lines / invasive devices: none  Code Status: FULL CODE ACP documentation reviewed:  none on file in [facility]  TOC needs: Follow-up with  DSS Barriers to dispo / significant pending items: Guardianship              Subjective / Brief ROS:  Patient reports no concerns at this time   Family Communication: none at this time     Objective Findings:  Vitals:   02/14/24 1953 02/15/24 0444 02/15/24 0500 02/15/24 0831  BP: 94/60 91/62  98/75  Pulse: 92 96  97   Resp: 20 16  18   Temp: 97.9 F (36.6 C) 98.4 F (36.9 C)  97.9 F (36.6 C)  TempSrc: Oral     SpO2: 100% 100%  100%  Weight:   70.3 kg   Height:        Intake/Output Summary (Last 24 hours) at 02/15/2024 1337 Last data filed at 02/15/2024 1100 Gross per 24 hour  Intake 120 ml  Output --  Net 120 ml   Filed Weights   02/12/24 0400 02/13/24 0713 02/15/24 0500  Weight: 72.2 kg 72.1 kg 70.3 kg    Examination:  Physical Exam Constitutional:      General: She is not in acute distress. Pulmonary:     Effort: Pulmonary effort is normal.  Neurological:     Mental Status: She is alert. Mental status is at baseline.  Psychiatric:        Behavior: Behavior normal.          Scheduled Medications:   atovaquone  1,500 mg Oral Q breakfast   azithromycin  1,200 mg Oral Weekly   bictegravir-emtricitabine-tenofovir AF  1 tablet Oral Daily   docusate sodium  100 mg Oral BID   DULoxetine  30 mg Oral QHS   enoxaparin (LOVENOX) injection  40 mg Subcutaneous QHS   feeding supplement  237 mL Oral QID   folic acid  1 mg Oral Daily   hydrocerin   Topical BID   lidocaine  1 patch Transdermal Q24H   midodrine  10 mg Oral TID WC   multivitamin with minerals  1 tablet Oral QHS   pantoprazole  40 mg Oral BID   thiamine  100 mg Oral Daily    Continuous Infusions:   PRN Medications:  acetaminophen, cyclobenzaprine, dicyclomine, diphenhydrAMINE, hydrocortisone, ipratropium-albuterol, lactulose, nicotine polacrilex, ondansetron, mouth rinse, oxyCODONE, polyethylene glycol  Antimicrobials from admission:  Anti-infectives (From admission, onward)    Start     Dose/Rate Route Frequency Ordered Stop   01/25/24 0000  azithromycin (ZITHROMAX) 600 MG tablet        1,200 mg Oral Weekly 01/25/24 1610     01/25/24 0000  bictegravir-emtricitabine-tenofovir AF (BIKTARVY) 50-200-25 MG TABS tablet        1 tablet Oral Daily 01/25/24 1610     01/25/24 0000  atovaquone (MEPRON) 750 MG/5ML  suspension        1,500 mg Oral Daily with breakfast 01/25/24 1610     12/25/23 0800  atovaquone (MEPRON) 750 MG/5ML suspension 1,500 mg        1,500 mg Oral Daily with breakfast 12/24/23 1558     12/24/23 1000  sulfamethoxazole-trimethoprim (BACTRIM) 400-80 MG per tablet 1 tablet  Status:  Discontinued        1 tablet Oral Daily 12/24/23 0749 12/24/23 1557   12/19/23 1400  azithromycin (ZITHROMAX) tablet 1,200 mg        1,200 mg Oral Weekly 12/18/23 1346     12/18/23 1000  sulfamethoxazole-trimethoprim (BACTRIM DS) 800-160 MG per tablet 1 tablet  Status:  Discontinued        1  tablet Oral Daily 12/17/23 1621 12/24/23 0748   12/14/23 1530  bictegravir-emtricitabine-tenofovir AF (BIKTARVY) 50-200-25 MG per tablet 1 tablet        1 tablet Oral Daily 12/14/23 1442     12/12/23 1000  amoxicillin-clavulanate (AUGMENTIN) 875-125 MG per tablet 1 tablet        1 tablet Oral Every 12 hours 12/11/23 1345 12/12/23 2131   12/12/23 1000  azithromycin (ZITHROMAX) tablet 1,250 mg  Status:  Discontinued        1,200 mg Oral Weekly 12/11/23 1618 12/18/23 1346   12/11/23 2000  sulfamethoxazole-trimethoprim (BACTRIM) 400-80 MG per tablet 1 tablet  Status:  Discontinued        1 tablet Oral Daily 12/11/23 1618 12/17/23 1621   12/09/23 2200  azithromycin (ZITHROMAX) 500 mg in sodium chloride 0.9 % 250 mL IVPB  Status:  Discontinued        500 mg 250 mL/hr over 60 Minutes Intravenous Every 24 hours 12/09/23 0734 12/11/23 1618   12/09/23 2000  cefTRIAXone (ROCEPHIN) 2 g in sodium chloride 0.9 % 100 mL IVPB  Status:  Discontinued        2 g 200 mL/hr over 30 Minutes Intravenous Every 24 hours 12/09/23 0953 12/09/23 1840   12/09/23 2000  piperacillin-tazobactam (ZOSYN) IVPB 3.375 g        3.375 g 12.5 mL/hr over 240 Minutes Intravenous Every 8 hours 12/09/23 1850 12/12/23 0037   12/09/23 0600  Ampicillin-Sulbactam (UNASYN) 3 g in sodium chloride 0.9 % 100 mL IVPB  Status:  Discontinued        3 g 200 mL/hr over  30 Minutes Intravenous Every 6 hours 12/09/23 0442 12/09/23 0953   12/08/23 2215  cefTRIAXone (ROCEPHIN) 2 g in sodium chloride 0.9 % 100 mL IVPB        2 g 200 mL/hr over 30 Minutes Intravenous Once 12/08/23 2214 12/08/23 2308   12/08/23 2215  azithromycin (ZITHROMAX) 500 mg in sodium chloride 0.9 % 250 mL IVPB        500 mg 250 mL/hr over 60 Minutes Intravenous  Once 12/08/23 2214 12/09/23 0000           Data Reviewed:  I have personally reviewed the following...  CBC: Recent Labs  Lab 02/12/24 0509 02/14/24 1525 02/15/24 0524  WBC 4.7 4.1 4.6  NEUTROABS 1.2* 1.3*  --   HGB 8.8* 8.9* 9.0*  HCT 26.8* 27.8* 27.6*  MCV 85.6 84 86.0  PLT 223 245 220   Basic Metabolic Panel: Recent Labs  Lab 02/12/24 0509  NA 136  K 3.9  CL 105  CO2 24  GLUCOSE 85  BUN 20  CREATININE 0.75  CALCIUM 9.5   GFR: Estimated Creatinine Clearance: 85.8 mL/min (by C-G formula based on SCr of 0.75 mg/dL). Liver Function Tests: No results for input(s): "AST", "ALT", "ALKPHOS", "BILITOT", "PROT", "ALBUMIN" in the last 168 hours. No results for input(s): "LIPASE", "AMYLASE" in the last 168 hours. No results for input(s): "AMMONIA" in the last 168 hours. Coagulation Profile: No results for input(s): "INR", "PROTIME" in the last 168 hours. Cardiac Enzymes: No results for input(s): "CKTOTAL", "CKMB", "CKMBINDEX", "TROPONINI" in the last 168 hours. BNP (last 3 results) No results for input(s): "PROBNP" in the last 8760 hours. HbA1C: No results for input(s): "HGBA1C" in the last 72 hours. CBG: No results for input(s): "GLUCAP" in the last 168 hours. Lipid Profile: No results for input(s): "CHOL", "HDL", "LDLCALC", "TRIG", "CHOLHDL", "LDLDIRECT" in the last 72 hours. Thyroid  Function Tests: No results for input(s): "TSH", "T4TOTAL", "FREET4", "T3FREE", "THYROIDAB" in the last 72 hours. Anemia Panel: No results for input(s): "VITAMINB12", "FOLATE", "FERRITIN", "TIBC", "IRON", "RETICCTPCT" in  the last 72 hours. Most Recent Urinalysis On File:     Component Value Date/Time   COLORURINE YELLOW (A) 01/21/2024 1215   APPEARANCEUR HAZY (A) 01/21/2024 1215   APPEARANCEUR Clear 08/18/2014 1354   LABSPEC 1.023 01/21/2024 1215   LABSPEC 1.020 08/18/2014 1354   PHURINE 7.0 01/21/2024 1215   GLUCOSEU NEGATIVE 01/21/2024 1215   GLUCOSEU 50 mg/dL 12/13/7251 6644   HGBUR NEGATIVE 01/21/2024 1215   BILIRUBINUR NEGATIVE 01/21/2024 1215   BILIRUBINUR Negative 08/18/2014 1354   KETONESUR NEGATIVE 01/21/2024 1215   PROTEINUR NEGATIVE 01/21/2024 1215   NITRITE NEGATIVE 01/21/2024 1215   LEUKOCYTESUR MODERATE (A) 01/21/2024 1215   LEUKOCYTESUR Negative 08/18/2014 1354   Sepsis Labs: @LABRCNTIP (procalcitonin:4,lacticidven:4) Microbiology: No results found for this or any previous visit (from the past 240 hours).    Radiology Studies last 3 days: No results found.     Time spent: 25  min    Sunnie Nielsen, DO [facility] 02/15/2024, 1:37 PM    Dictation software may have been used to generate the above note. Typos may occur and escape review in typed/dictated notes. Please contact Dr Lyn Hollingshead directly for clarity if needed.  Staff may message me via secure chat in Epic  but this may not receive an immediate response,  please page me for urgent matters!  If 7PM-7AM, please contact night coverage [email or web address]

## 2024-02-15 NOTE — Plan of Care (Signed)
  Problem: Coping: Goal: Ability to adjust to condition or change in health will improve Outcome: Progressing   Problem: Fluid Volume: Goal: Ability to maintain a balanced intake and output will improve Outcome: Progressing   Problem: Health Behavior/Discharge Planning: Goal: Ability to identify and utilize available resources and services will improve Outcome: Progressing Goal: Ability to manage health-related needs will improve Outcome: Progressing   Problem: Metabolic: Goal: Ability to maintain appropriate glucose levels will improve Outcome: Progressing   Problem: Nutritional: Goal: Maintenance of adequate nutrition will improve Outcome: Progressing Goal: Progress toward achieving an optimal weight will improve Outcome: Progressing   Problem: Skin Integrity: Goal: Risk for impaired skin integrity will decrease Outcome: Progressing   Problem: Tissue Perfusion: Goal: Adequacy of tissue perfusion will improve Outcome: Progressing   Problem: Education: Goal: Knowledge of General Education information will improve Description: Including pain rating scale, medication(s)/side effects and non-pharmacologic comfort measures Outcome: Progressing   Problem: Health Behavior/Discharge Planning: Goal: Ability to manage health-related needs will improve Outcome: Progressing   Problem: Clinical Measurements: Goal: Ability to maintain clinical measurements within normal limits will improve Outcome: Progressing Goal: Will remain free from infection Outcome: Progressing Goal: Diagnostic test results will improve Outcome: Progressing Goal: Respiratory complications will improve Outcome: Progressing Goal: Cardiovascular complication will be avoided Outcome: Progressing   Problem: Activity: Goal: Risk for activity intolerance will decrease Outcome: Progressing   Problem: Nutrition: Goal: Adequate nutrition will be maintained Outcome: Progressing   Problem: Coping: Goal:  Level of anxiety will decrease Outcome: Progressing   Problem: Elimination: Goal: Will not experience complications related to bowel motility Outcome: Progressing Goal: Will not experience complications related to urinary retention Outcome: Progressing   Problem: Pain Management: Goal: General experience of comfort will improve Outcome: Progressing   Problem: Safety: Goal: Ability to remain free from injury will improve Outcome: Progressing   Problem: Skin Integrity: Goal: Risk for impaired skin integrity will decrease Outcome: Progressing   Problem: Activity: Goal: Ability to tolerate increased activity will improve Outcome: Progressing   Problem: Respiratory: Goal: Ability to maintain a clear airway and adequate ventilation will improve Outcome: Progressing

## 2024-02-15 NOTE — Plan of Care (Signed)
  Problem: Clinical Measurements: Goal: Respiratory complications will improve Outcome: Progressing Goal: Cardiovascular complication will be avoided Outcome: Progressing   Problem: Elimination: Goal: Will not experience complications related to urinary retention Outcome: Progressing   Problem: Skin Integrity: Goal: Risk for impaired skin integrity will decrease Outcome: Not Progressing   Problem: Education: Goal: Knowledge of General Education information will improve Description: Including pain rating scale, medication(s)/side effects and non-pharmacologic comfort measures Outcome: Not Progressing   Problem: Pain Management: Goal: General experience of comfort will improve Outcome: Not Progressing

## 2024-02-16 DIAGNOSIS — B20 Human immunodeficiency virus [HIV] disease: Secondary | ICD-10-CM | POA: Diagnosis not present

## 2024-02-16 DIAGNOSIS — F02818 Dementia in other diseases classified elsewhere, unspecified severity, with other behavioral disturbance: Secondary | ICD-10-CM | POA: Diagnosis not present

## 2024-02-16 MED ORDER — LOPERAMIDE HCL 2 MG PO CAPS
4.0000 mg | ORAL_CAPSULE | ORAL | Status: DC | PRN
  Administered 2024-02-16 – 2024-02-17 (×3): 4 mg via ORAL
  Filled 2024-02-16 (×3): qty 2

## 2024-02-16 NOTE — Plan of Care (Signed)
  Problem: Coping: Goal: Ability to adjust to condition or change in health will improve Outcome: Progressing   Problem: Activity: Goal: Risk for activity intolerance will decrease Outcome: Progressing   Pt reporting multiple loose stools and emesis episodes. Zofran and bentyl administered. MD notified and imodium ordered and given.

## 2024-02-16 NOTE — Progress Notes (Signed)
 PROGRESS NOTE    Meghan Jarvis   VOZ:[id number] DOB: July 09, 1976  DOA: 12/08/2023 Date of Service: 02/16/24 which is hospital day 56  PCP: Healthcare, [facility] course / significant events:  48yo with h/o polysubstance (ETOH, cocaine) abuse, afib, homelessness, AIDS, hypothyroidism, and DM who presented on 12/08/23 with acute respiratory failure.  She was found down by a friend and could not wake her up.  She was found to be hypoxic in the 70s. Admitted to ICU, intubated, started on antibiotics for pneumonia.  12/31: extubated. ID consulted.  MRI brain with advanced cerebral atrophy.  Psych evaluated - "Patient is lacking and reasoning in her medical care for example she is unable to describe the consequence that she will have if she does not take her medications or has complications with her chronic conditions" and needs assistance with housing and finances due to cognitive decline.  APS/DSS pending guardianship and will need placement.   Consultants:  infectious disease Psychiatry  Procedures/Surgeries: none      ASSESSMENT & PLAN:   Note: Patient is medically stable and awaiting DSS guardianship for placement.     HIV Associated Neurocognitive Disorder Mental status improved from admission but patient is a poor historian and does not have capacity to make decisions at this time per psychiatry DSS referral by [facility] MRI did not show any enhancing lesions but did show severe cerebral atrophy - likely HIV associated neurocognitive disorder DSS is working on obtaining guardianship Patient should not be discharged prior to DSS approval per hospital leadership The patient is currently unsure what her discharge plan will be (generally names different family members daily, but none today)   Pain Has complained of back, knee, hip, and abdominal pain during hospitalization Exam nonspecific Neg XR L hip, lumbar spine, R knee, CT A/P, MR lumbar and thoracic spine, L  hand/wrist, R elbow, L shoulder, CT C spine PT/OT consulted and suggest home PT vs. 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  Viral load 2 million, CD4 14 on admission Infectious disease following, started Biktarvy Placed on PCP and MAI prophylaxis with atovaquone and Zithromax Toxoplasma antibody IgG came back elevated but MRI of the brain does not show any enhancing lesions Repeat viral load down to 34,300 Infectious disease on board    Pancytopenia  Secondary to HIV Platelet count and WBC have normalized Hgb is stable at last check Monitor periodic CBC   Cocaine abuse  Patient has been counseled on cessation of cocaine use   Alcohol abuse Continue thiamine, multivitamin, and folic acid        No concerns based on BMI: Body mass index is 24.17 kg/m.  Underweight - under 18  overweight - 25 to 29 obese - 30 or more Class 1 obesity: BMI of 30.0 to 34 Class 2 obesity: BMI of 35.0 to 39 Class 3 obesity: BMI of 40.0 to 49 Super Morbid Obesity: BMI 50-59 Super-super Morbid Obesity: BMI 60+ Significantly low or high BMI is associated with higher medical risk.  Weight management advised as adjunct to other disease management and risk reduction treatments    DVT prophylaxis: lovenox IV fluids: no continuous IV fluids  Nutrition: regular diet  Central lines / invasive devices: none  Code Status: FULL CODE ACP documentation reviewed:  none  on file in [facility]  [facility] needs: Follow-up with DSS Barriers to dispo / significant pending items: Guardianship              Subjective / Brief ROS:  Loose stool, otherwise no concerns    Family Communication: none at this time     Objective Findings:  Vitals:   02/15/24 1447 02/15/24 2001 02/16/24 0422 02/16/24  0825  BP: 98/72 97/66  110/66  Pulse: 95 94  (!) 107  Resp: 18 16  18   Temp: 98 F (36.7 C) 98.2 F (36.8 C)  98.8 F (37.1 C)  TempSrc:  Oral  Oral  SpO2: 100% 100%  100%  Weight:   71.4 kg   Height:        Intake/Output Summary (Last 24 hours) at 02/16/2024 1324 Last data filed at 02/15/2024 1500 Gross per 24 hour  Intake 120 ml  Output --  Net 120 ml   Filed Weights   02/13/24 0713 02/15/24 0500 02/16/24 0422  Weight: 72.1 kg 70.3 kg 71.4 kg    Examination:  Physical Exam Constitutional:      General: She is not in acute distress. Pulmonary:     Effort: Pulmonary effort is normal.  Neurological:     Mental Status: She is alert. Mental status is at baseline.  Psychiatric:        Behavior: Behavior normal.          Scheduled Medications:   atovaquone  1,500 mg Oral Q breakfast   azithromycin  1,200 mg Oral Weekly   bictegravir-emtricitabine-tenofovir AF  1 tablet Oral Daily   docusate sodium  100 mg Oral BID   DULoxetine  30 mg Oral QHS   enoxaparin (LOVENOX) injection  40 mg Subcutaneous QHS   feeding supplement  237 mL Oral QID   folic acid  1 mg Oral Daily   hydrocerin   Topical BID   lidocaine  1 patch Transdermal Q24H   midodrine  10 mg Oral TID WC   multivitamin with minerals  1 tablet Oral QHS   pantoprazole  40 mg Oral BID   thiamine  100 mg Oral Daily    Continuous Infusions:   PRN Medications:  acetaminophen, cyclobenzaprine, dicyclomine, diphenhydrAMINE, hydrocortisone, ipratropium-albuterol, lactulose, loperamide, nicotine polacrilex, ondansetron, mouth rinse, oxyCODONE, polyethylene glycol  Antimicrobials from admission:  Anti-infectives (From admission, onward)    Start     Dose/Rate Route Frequency Ordered Stop   01/25/24 0000  azithromycin (ZITHROMAX) 600 MG tablet        1,200 mg Oral Weekly 01/25/24 1610     01/25/24 0000  bictegravir-emtricitabine-tenofovir AF (BIKTARVY) 50-200-25 MG TABS tablet        1 tablet Oral Daily  01/25/24 1610     01/25/24 0000  atovaquone (MEPRON) 750 MG/5ML suspension        1,500 mg Oral Daily with breakfast 01/25/24 1610     12/25/23 0800  atovaquone (MEPRON) 750 MG/5ML suspension 1,500 mg        1,500 mg Oral Daily with breakfast 12/24/23 1558     12/24/23 1000  sulfamethoxazole-trimethoprim (BACTRIM) 400-80 MG per tablet 1 tablet  Status:  Discontinued        1 tablet Oral Daily 12/24/23 0749 12/24/23 1557   12/19/23 1400  azithromycin (ZITHROMAX) tablet 1,200 mg        1,200 mg Oral Weekly 12/18/23 1346     12/18/23 1000  sulfamethoxazole-trimethoprim (BACTRIM DS) 800-160 MG per tablet 1 tablet  Status:  Discontinued        1 tablet Oral Daily 12/17/23 1621 12/24/23 0748   12/14/23 1530  bictegravir-emtricitabine-tenofovir AF (BIKTARVY) 50-200-25 MG per tablet 1 tablet        1 tablet Oral Daily 12/14/23 1442     12/12/23 1000  amoxicillin-clavulanate (AUGMENTIN) 875-125 MG per tablet 1 tablet        1 tablet Oral Every 12 hours 12/11/23 1345 12/12/23 2131   12/12/23 1000  azithromycin (ZITHROMAX) tablet 1,250 mg  Status:  Discontinued        1,200 mg Oral Weekly 12/11/23 1618 12/18/23 1346   12/11/23 2000  sulfamethoxazole-trimethoprim (BACTRIM) 400-80 MG per tablet 1 tablet  Status:  Discontinued        1 tablet Oral Daily 12/11/23 1618 12/17/23 1621   12/09/23 2200  azithromycin (ZITHROMAX) 500 mg in sodium chloride 0.9 % 250 mL IVPB  Status:  Discontinued        500 mg 250 mL/hr over 60 Minutes Intravenous Every 24 hours 12/09/23 0734 12/11/23 1618   12/09/23 2000  cefTRIAXone (ROCEPHIN) 2 g in sodium chloride 0.9 % 100 mL IVPB  Status:  Discontinued        2 g 200 mL/hr over 30 Minutes Intravenous Every 24 hours 12/09/23 0953 12/09/23 1840   12/09/23 2000  piperacillin-tazobactam (ZOSYN) IVPB 3.375 g        3.375 g 12.5 mL/hr over 240 Minutes Intravenous Every 8 hours 12/09/23 1850 12/12/23 0037   12/09/23 0600  Ampicillin-Sulbactam (UNASYN) 3 g in sodium chloride 0.9  % 100 mL IVPB  Status:  Discontinued        3 g 200 mL/hr over 30 Minutes Intravenous Every 6 hours 12/09/23 0442 12/09/23 0953   12/08/23 2215  cefTRIAXone (ROCEPHIN) 2 g in sodium chloride 0.9 % 100 mL IVPB        2 g 200 mL/hr over 30 Minutes Intravenous Once 12/08/23 2214 12/08/23 2308   12/08/23 2215  azithromycin (ZITHROMAX) 500 mg in sodium chloride 0.9 % 250 mL IVPB        500 mg 250 mL/hr over 60 Minutes Intravenous  Once 12/08/23 2214 12/09/23 0000           Data Reviewed:  I have personally reviewed the following...  CBC: Recent Labs  Lab 02/12/24 0509 02/14/24 1525 02/15/24 0524  WBC 4.7 4.1 4.6  NEUTROABS 1.2* 1.3*  --   HGB 8.8* 8.9* 9.0*  HCT 26.8* 27.8* 27.6*  MCV 85.6 84 86.0  PLT 223 245 220   Basic Metabolic Panel: Recent Labs  Lab 02/12/24 0509  NA 136  K 3.9  CL 105  CO2 24  GLUCOSE 85  BUN 20  CREATININE 0.75  CALCIUM 9.5   GFR: Estimated Creatinine Clearance: 85.8 mL/min (by C-G formula based on SCr of 0.75 mg/dL). Liver Function Tests: No results for input(s): "AST", "ALT", "ALKPHOS", "BILITOT", "PROT", "ALBUMIN" in the last 168 hours. No results for input(s): "LIPASE", "AMYLASE" in the last 168 hours. No results for input(s): "AMMONIA" in the last 168 hours. Coagulation Profile: No results for input(s): "INR", "PROTIME" in the last 168 hours. Cardiac Enzymes: No results for input(s): "CKTOTAL", "CKMB", "CKMBINDEX", "TROPONINI" in the last 168 hours. BNP (last 3 results) No results for input(s): "PROBNP" in the last 8760 hours. HbA1C: No results for input(s): "HGBA1C" in the last 72 hours. CBG: No results for input(s): "GLUCAP" in the last 168 hours. Lipid Profile: No results for input(s): "CHOL", "HDL", "LDLCALC", "  TRIG", "CHOLHDL", "LDLDIRECT" in the last 72 hours. Thyroid Function Tests: No results for input(s): "TSH", "T4TOTAL", "FREET4", "T3FREE", "THYROIDAB" in the last 72 hours. Anemia Panel: No results for input(s):  "VITAMINB12", "FOLATE", "FERRITIN", "TIBC", "IRON", "RETICCTPCT" in the last 72 hours. Most Recent Urinalysis On File:     Component Value Date/Time   COLORURINE YELLOW (A) 01/21/2024 1215   APPEARANCEUR HAZY (A) 01/21/2024 1215   APPEARANCEUR Clear 08/18/2014 1354   LABSPEC 1.023 01/21/2024 1215   LABSPEC 1.020 08/18/2014 1354   PHURINE 7.0 01/21/2024 1215   GLUCOSEU NEGATIVE 01/21/2024 1215   GLUCOSEU 50 mg/dL 78/29/5621 3086   HGBUR NEGATIVE 01/21/2024 1215   BILIRUBINUR NEGATIVE 01/21/2024 1215   BILIRUBINUR Negative 08/18/2014 1354   KETONESUR NEGATIVE 01/21/2024 1215   PROTEINUR NEGATIVE 01/21/2024 1215   NITRITE NEGATIVE 01/21/2024 1215   LEUKOCYTESUR MODERATE (A) 01/21/2024 1215   LEUKOCYTESUR Negative 08/18/2014 1354   Sepsis Labs: @LABRCNTIP (procalcitonin:4,lacticidven:4) Microbiology: No results found for this or any previous visit (from the past 240 hours).    Radiology Studies last 3 days: No results found.     Time spent: 25  min    Sunnie Nielsen, DO [facility] 02/16/2024, 1:24 PM    Dictation software may have been used to generate the above note. Typos may occur and escape review in typed/dictated notes. Please contact Dr Lyn Hollingshead directly for clarity if needed.  Staff may message me via secure chat in Epic  but this may not receive an immediate response,  please page me for urgent matters!  If 7PM-7AM, please contact night coverage [email or web address]

## 2024-02-17 NOTE — Progress Notes (Addendum)
 PROGRESS NOTE    Meghan MCCLISH   ZOX:[id number] DOB: 04/07/1976  DOA: 12/08/2023 Date of Service: 02/17/24 which is hospital day 40  PCP: Healthcare, [facility] course / significant events:  48yo with h/o polysubstance (ETOH, cocaine) abuse, afib, homelessness, AIDS, hypothyroidism, and DM who presented on 12/08/23 with acute respiratory failure.  She was found down by a friend and could not wake her up.  She was found to be hypoxic in the 70s. Admitted to ICU, intubated, started on antibiotics for pneumonia.  12/31: extubated. ID consulted.  MRI brain with advanced cerebral atrophy.  Psych evaluated - "Patient is lacking and reasoning in her medical care for example she is unable to describe the consequence that she will have if she does not take her medications or has complications with her chronic conditions" and needs assistance with housing and finances due to cognitive decline.  APS/DSS pending guardianship and will need placement.   Consultants:  infectious disease Psychiatry  Procedures/Surgeries: none      ASSESSMENT & PLAN:   Note: Patient is medically stable and awaiting DSS guardianship for placement.     HIV Associated Neurocognitive Disorder Mental status improved from admission but patient is a poor historian and does not have capacity to make decisions at this time per psychiatry DSS referral by [facility] MRI did not show any enhancing lesions but did show severe cerebral atrophy - likely HIV associated neurocognitive disorder DSS is working on obtaining guardianship Patient should not be discharged prior to DSS approval per hospital leadership The patient is currently unsure what her discharge plan will be (generally names different family members daily, but none today)   Pain Has complained of back, knee, hip, and abdominal pain during hospitalization Exam nonspecific Neg XR L hip, lumbar spine, R knee, CT A/P, MR lumbar and thoracic spine, L  hand/wrist, R elbow, L shoulder, CT C spine PT/OT consulted and suggest home PT vs. 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  Viral load 2 million, CD4 14 on admission Infectious disease following, started Biktarvy Placed on PCP and MAI prophylaxis with atovaquone and Zithromax Toxoplasma antibody IgG came back elevated but MRI of the brain does not show any enhancing lesions Repeat viral load down to 34,300 Infectious disease on board    Pancytopenia  Secondary to HIV Platelet count and WBC have normalized Hgb is stable at last check Monitor periodic CBC   Cocaine abuse  Patient has been counseled on cessation of cocaine use   Alcohol abuse Continue thiamine, multivitamin, and folic acid        No concerns based on BMI: Body mass index is 24.17 kg/m.  Underweight - under 18  overweight - 25 to 29 obese - 30 or more Class 1 obesity: BMI of 30.0 to 34 Class 2 obesity: BMI of 35.0 to 39 Class 3 obesity: BMI of 40.0 to 49 Super Morbid Obesity: BMI 50-59 Super-super Morbid Obesity: BMI 60+ Significantly low or high BMI is associated with higher medical risk.  Weight management advised as adjunct to other disease management and risk reduction treatments    DVT prophylaxis: lovenox IV fluids: no continuous IV fluids  Nutrition: regular diet  Central lines / invasive devices: none  Code Status: FULL CODE ACP documentation reviewed:  none  on file in [facility]  [facility] needs: Follow-up with DSS Barriers to dispo / significant pending items: Guardianship              Subjective / Brief ROS:  Loose stool, otherwise no concerns    Family Communication: none at this time     Objective Findings:  Vitals:   02/16/24 2034 02/17/24 0354 02/17/24 0420 02/17/24  0943  BP: 104/67 108/67 104/75 96/63  Pulse: 100 94 94 87  Resp: 18 18 18 18   Temp: 98.4 F (36.9 C) 98.6 F (37 C) 99.2 F (37.3 C) 98.3 F (36.8 C)  TempSrc:      SpO2: 100% 100% 100% 100%  Weight:      Height:        Intake/Output Summary (Last 24 hours) at 02/17/2024 1430 Last data filed at 02/17/2024 1100 Gross per 24 hour  Intake 240 ml  Output --  Net 240 ml   Filed Weights   02/13/24 0713 02/15/24 0500 02/16/24 0422  Weight: 72.1 kg 70.3 kg 71.4 kg    Examination:  Physical Exam Constitutional:      General: She is not in acute distress. Pulmonary:     Effort: Pulmonary effort is normal.  Neurological:     Mental Status: She is alert. Mental status is at baseline.  Psychiatric:        Behavior: Behavior normal.          Scheduled Medications:   atovaquone  1,500 mg Oral Q breakfast   azithromycin  1,200 mg Oral Weekly   bictegravir-emtricitabine-tenofovir AF  1 tablet Oral Daily   docusate sodium  100 mg Oral BID   DULoxetine  30 mg Oral QHS   enoxaparin (LOVENOX) injection  40 mg Subcutaneous QHS   feeding supplement  237 mL Oral QID   folic acid  1 mg Oral Daily   hydrocerin   Topical BID   lidocaine  1 patch Transdermal Q24H   midodrine  10 mg Oral TID WC   multivitamin with minerals  1 tablet Oral QHS   pantoprazole  40 mg Oral BID   thiamine  100 mg Oral Daily    Continuous Infusions:   PRN Medications:  acetaminophen, cyclobenzaprine, dicyclomine, diphenhydrAMINE, hydrocortisone, ipratropium-albuterol, lactulose, loperamide, nicotine polacrilex, ondansetron, mouth rinse, oxyCODONE, polyethylene glycol  Antimicrobials from admission:  Anti-infectives (From admission, onward)    Start     Dose/Rate Route Frequency Ordered Stop   01/25/24 0000  azithromycin (ZITHROMAX) 600 MG tablet        1,200 mg Oral Weekly 01/25/24 1610     01/25/24 0000  bictegravir-emtricitabine-tenofovir AF (BIKTARVY) 50-200-25 MG TABS tablet        1 tablet  Oral Daily 01/25/24 1610     01/25/24 0000  atovaquone (MEPRON) 750 MG/5ML suspension        1,500 mg Oral Daily with breakfast 01/25/24 1610     12/25/23 0800  atovaquone (MEPRON) 750 MG/5ML suspension 1,500 mg        1,500 mg Oral Daily with breakfast 12/24/23 1558     12/24/23 1000  sulfamethoxazole-trimethoprim (BACTRIM) 400-80 MG per tablet 1 tablet  Status:  Discontinued        1 tablet Oral Daily 12/24/23 0749 12/24/23 1557   12/19/23 1400  azithromycin (ZITHROMAX) tablet 1,200 mg        1,200 mg Oral Weekly 12/18/23 1346     12/18/23 1000  sulfamethoxazole-trimethoprim (BACTRIM DS) 800-160 MG per tablet 1 tablet  Status:  Discontinued        1 tablet Oral Daily 12/17/23 1621 12/24/23 0748   12/14/23 1530  bictegravir-emtricitabine-tenofovir AF (BIKTARVY) 50-200-25 MG per tablet 1 tablet        1 tablet Oral Daily 12/14/23 1442     12/12/23 1000  amoxicillin-clavulanate (AUGMENTIN) 875-125 MG per tablet 1 tablet        1 tablet Oral Every 12 hours 12/11/23 1345 12/12/23 2131   12/12/23 1000  azithromycin (ZITHROMAX) tablet 1,250 mg  Status:  Discontinued        1,200 mg Oral Weekly 12/11/23 1618 12/18/23 1346   12/11/23 2000  sulfamethoxazole-trimethoprim (BACTRIM) 400-80 MG per tablet 1 tablet  Status:  Discontinued        1 tablet Oral Daily 12/11/23 1618 12/17/23 1621   12/09/23 2200  azithromycin (ZITHROMAX) 500 mg in sodium chloride 0.9 % 250 mL IVPB  Status:  Discontinued        500 mg 250 mL/hr over 60 Minutes Intravenous Every 24 hours 12/09/23 0734 12/11/23 1618   12/09/23 2000  cefTRIAXone (ROCEPHIN) 2 g in sodium chloride 0.9 % 100 mL IVPB  Status:  Discontinued        2 g 200 mL/hr over 30 Minutes Intravenous Every 24 hours 12/09/23 0953 12/09/23 1840   12/09/23 2000  piperacillin-tazobactam (ZOSYN) IVPB 3.375 g        3.375 g 12.5 mL/hr over 240 Minutes Intravenous Every 8 hours 12/09/23 1850 12/12/23 0037   12/09/23 0600  Ampicillin-Sulbactam (UNASYN) 3 g in sodium  chloride 0.9 % 100 mL IVPB  Status:  Discontinued        3 g 200 mL/hr over 30 Minutes Intravenous Every 6 hours 12/09/23 0442 12/09/23 0953   12/08/23 2215  cefTRIAXone (ROCEPHIN) 2 g in sodium chloride 0.9 % 100 mL IVPB        2 g 200 mL/hr over 30 Minutes Intravenous Once 12/08/23 2214 12/08/23 2308   12/08/23 2215  azithromycin (ZITHROMAX) 500 mg in sodium chloride 0.9 % 250 mL IVPB        500 mg 250 mL/hr over 60 Minutes Intravenous  Once 12/08/23 2214 12/09/23 0000           Data Reviewed:  I have personally reviewed the following...  CBC: Recent Labs  Lab 02/12/24 0509 02/14/24 1525 02/15/24 0524  WBC 4.7 4.1 4.6  NEUTROABS 1.2* 1.3*  --   HGB 8.8* 8.9* 9.0*  HCT 26.8* 27.8* 27.6*  MCV 85.6 84 86.0  PLT 223 245 220   Basic Metabolic Panel: Recent Labs  Lab 02/12/24 0509  NA 136  K 3.9  CL 105  CO2 24  GLUCOSE 85  BUN 20  CREATININE 0.75  CALCIUM 9.5   GFR: Estimated Creatinine Clearance: 85.8 mL/min (by C-G formula based on SCr of 0.75 mg/dL). Liver Function Tests: No results for input(s): "AST", "ALT", "ALKPHOS", "BILITOT", "PROT", "ALBUMIN" in the last 168 hours. No results for input(s): "LIPASE", "AMYLASE" in the last 168 hours. No results for input(s): "AMMONIA" in the last 168 hours. Coagulation Profile: No results for input(s): "INR", "PROTIME" in the last 168 hours. Cardiac Enzymes: No results for input(s): "CKTOTAL", "CKMB", "CKMBINDEX", "TROPONINI" in the last 168 hours. BNP (last 3 results) No results for input(s): "PROBNP" in the last 8760 hours. HbA1C: No results for input(s): "HGBA1C" in the last 72 hours. CBG: No results for input(s): "GLUCAP" in the last 168 hours. Lipid Profile: No results for input(s): "CHOL", "  HDL", "LDLCALC", "TRIG", "CHOLHDL", "LDLDIRECT" in the last 72 hours. Thyroid Function Tests: No results for input(s): "TSH", "T4TOTAL", "FREET4", "T3FREE", "THYROIDAB" in the last 72 hours. Anemia Panel: No results for  input(s): "VITAMINB12", "FOLATE", "FERRITIN", "TIBC", "IRON", "RETICCTPCT" in the last 72 hours. Most Recent Urinalysis On File:     Component Value Date/Time   COLORURINE YELLOW (A) 01/21/2024 1215   APPEARANCEUR HAZY (A) 01/21/2024 1215   APPEARANCEUR Clear 08/18/2014 1354   LABSPEC 1.023 01/21/2024 1215   LABSPEC 1.020 08/18/2014 1354   PHURINE 7.0 01/21/2024 1215   GLUCOSEU NEGATIVE 01/21/2024 1215   GLUCOSEU 50 mg/dL 09/81/1914 7829   HGBUR NEGATIVE 01/21/2024 1215   BILIRUBINUR NEGATIVE 01/21/2024 1215   BILIRUBINUR Negative 08/18/2014 1354   KETONESUR NEGATIVE 01/21/2024 1215   PROTEINUR NEGATIVE 01/21/2024 1215   NITRITE NEGATIVE 01/21/2024 1215   LEUKOCYTESUR MODERATE (A) 01/21/2024 1215   LEUKOCYTESUR Negative 08/18/2014 1354   Sepsis Labs: @LABRCNTIP (procalcitonin:4,lacticidven:4) Microbiology: No results found for this or any previous visit (from the past 240 hours).    Radiology Studies last 3 days: No results found.     Time spent: 25  min    Sunnie Nielsen, DO [facility] 02/17/2024, 2:30 PM    Dictation software may have been used to generate the above note. Typos may occur and escape review in typed/dictated notes. Please contact Dr Lyn Hollingshead directly for clarity if needed.  Staff may message me via secure chat in Epic  but this may not receive an immediate response,  please page me for urgent matters!  If 7PM-7AM, please contact night coverage [email or web address]

## 2024-02-17 NOTE — Plan of Care (Signed)
  Problem: Coping: Goal: Ability to adjust to condition or change in health will improve Outcome: Progressing   Problem: Fluid Volume: Goal: Ability to maintain a balanced intake and output will improve Outcome: Progressing   Problem: Health Behavior/Discharge Planning: Goal: Ability to identify and utilize available resources and services will improve Outcome: Progressing Goal: Ability to manage health-related needs will improve Outcome: Progressing   Problem: Metabolic: Goal: Ability to maintain appropriate glucose levels will improve Outcome: Progressing   Problem: Nutritional: Goal: Maintenance of adequate nutrition will improve Outcome: Progressing Goal: Progress toward achieving an optimal weight will improve Outcome: Progressing   Problem: Skin Integrity: Goal: Risk for impaired skin integrity will decrease Outcome: Progressing   Problem: Education: Goal: Knowledge of General Education information will improve Description: Including pain rating scale, medication(s)/side effects and non-pharmacologic comfort measures Outcome: Progressing   Problem: Tissue Perfusion: Goal: Adequacy of tissue perfusion will improve Outcome: Progressing   Problem: Health Behavior/Discharge Planning: Goal: Ability to manage health-related needs will improve Outcome: Progressing   Problem: Activity: Goal: Risk for activity intolerance will decrease Outcome: Progressing

## 2024-02-18 LAB — ACID FAST SMEAR (AFB, MYCOBACTERIA)

## 2024-02-18 NOTE — Plan of Care (Signed)
  Problem: Coping: Goal: Ability to adjust to condition or change in health will improve Outcome: Progressing   Problem: Fluid Volume: Goal: Ability to maintain a balanced intake and output will improve Outcome: Progressing   Problem: Health Behavior/Discharge Planning: Goal: Ability to identify and utilize available resources and services will improve Outcome: Progressing Goal: Ability to manage health-related needs will improve Outcome: Progressing   Problem: Metabolic: Goal: Ability to maintain appropriate glucose levels will improve Outcome: Progressing   Problem: Nutritional: Goal: Maintenance of adequate nutrition will improve Outcome: Progressing Goal: Progress toward achieving an optimal weight will improve Outcome: Progressing   Problem: Skin Integrity: Goal: Risk for impaired skin integrity will decrease Outcome: Progressing   Problem: Tissue Perfusion: Goal: Adequacy of tissue perfusion will improve Outcome: Progressing   Problem: Education: Goal: Knowledge of General Education information will improve Description: Including pain rating scale, medication(s)/side effects and non-pharmacologic comfort measures Outcome: Progressing   Problem: Health Behavior/Discharge Planning: Goal: Ability to manage health-related needs will improve Outcome: Progressing   Problem: Clinical Measurements: Goal: Ability to maintain clinical measurements within normal limits will improve Outcome: Progressing Goal: Will remain free from infection Outcome: Progressing Goal: Diagnostic test results will improve Outcome: Progressing Goal: Respiratory complications will improve Outcome: Progressing Goal: Cardiovascular complication will be avoided Outcome: Progressing   Problem: Activity: Goal: Risk for activity intolerance will decrease Outcome: Progressing   Problem: Nutrition: Goal: Adequate nutrition will be maintained Outcome: Progressing   Problem: Coping: Goal:  Level of anxiety will decrease Outcome: Progressing   Problem: Elimination: Goal: Will not experience complications related to bowel motility Outcome: Progressing Goal: Will not experience complications related to urinary retention Outcome: Progressing   Problem: Pain Management: Goal: General experience of comfort will improve Outcome: Progressing   Problem: Safety: Goal: Ability to remain free from injury will improve Outcome: Progressing   Problem: Skin Integrity: Goal: Risk for impaired skin integrity will decrease Outcome: Progressing   Problem: Activity: Goal: Ability to tolerate increased activity will improve Outcome: Progressing   Problem: Respiratory: Goal: Ability to maintain a clear airway and adequate ventilation will improve Outcome: Progressing

## 2024-02-18 NOTE — Progress Notes (Signed)
 Mobility Specialist - Progress Note   02/18/24 1400  Mobility  Activity Ambulated independently in hallway  Level of Assistance Modified independent, requires aide device or extra time  Assistive Device Front wheel walker  Distance Ambulated (ft) 200 ft  Activity Response Tolerated well  Mobility visit 1 Mobility     Pt lying in bed upon arrival, utilizing RA. Pt agreeable to activity despite reporting not feeling well today. Reports pain in R side, extending down to knees. Pt completed bed mobility, STS, and ambulation modI. No LOB. Pt returned to bed with needs in reach.    Filiberto Pinks Mobility Specialist 02/18/24, 2:40 PM

## 2024-02-18 NOTE — Progress Notes (Addendum)
 PROGRESS NOTE    Meghan Jarvis   ZOX:[id number] DOB: [birth year]  DOA: 12/08/2023 Date of Service: 02/18/24 which is hospital day 72  PCP: [facility], [facility] course / significant events:  47yo with h/o polysubstance (ETOH, cocaine) abuse, afib, homelessness, AIDS, hypothyroidism, and DM who presented on 12/08/23 with acute respiratory failure.  She was found down by a friend and could not wake her up.  She was found to be hypoxic in the 70s. Admitted to ICU, intubated, started on antibiotics for pneumonia.  12/31: extubated. ID consulted.  MRI brain with advanced cerebral atrophy.  Psych evaluated - "Patient is lacking and reasoning in her medical care for example she is unable to describe the consequence that she will have if she does not take her medications or has complications with her chronic conditions" and needs assistance with housing and finances due to cognitive decline.  APS/DSS pending guardianship and will need placement.   Consultants:  infectious disease Psychiatry  Procedures/Surgeries: none      ASSESSMENT & PLAN:   Note: Patient is medically stable and awaiting DSS guardianship for placement.     HIV Associated Neurocognitive Disorder Mental status improved from admission but patient is a poor historian and does not have capacity to make decisions at this time per psychiatry DSS referral by [facility] MRI did not show any enhancing lesions but did show severe cerebral atrophy - likely HIV associated neurocognitive disorder DSS is working on obtaining guardianship Patient should not be discharged prior to DSS approval per hospital leadership The patient is currently unsure what her discharge plan will be (generally names different family members daily, but none today)   Pain Has complained of back, knee, hip, and abdominal pain during hospitalization Exam nonspecific Neg XR L hip, lumbar spine, R knee, CT A/P, MR lumbar and thoracic spine, L  hand/wrist, R elbow, L shoulder, CT C spine PT/OT consulted and suggest home PT vs. 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  Viral load 2 million, CD4 14 on admission Infectious disease following, started Biktarvy Placed on PCP and MAI prophylaxis with atovaquone and Zithromax Toxoplasma antibody IgG came back elevated but MRI of the brain does not show any enhancing lesions Repeat viral load down to 34,300 Infectious disease on board    Pancytopenia  Secondary to HIV Platelet count and WBC have normalized Hgb is stable at last check Monitor periodic CBC   Cocaine abuse  Patient has been counseled on cessation of cocaine use   Alcohol abuse Continue thiamine, multivitamin, and folic acid        No concerns based on BMI: Body mass index is 24.17 kg/m.  Underweight - under 18  overweight - 25 to 29 obese - 30 or more Class 1 obesity: BMI of 30.0 to 34 Class 2 obesity: BMI of 35.0 to 39 Class 3 obesity: BMI of 40.0 to 49 Super Morbid Obesity: BMI 50-59 Super-super Morbid Obesity: BMI 60+ Significantly low or high BMI is associated with higher medical risk.  Weight management advised as adjunct to other disease management and risk reduction treatments    DVT prophylaxis: lovenox IV fluids: no continuous IV fluids  Nutrition: regular diet  Central lines / invasive devices: none  Code Status: FULL CODE ACP documentation reviewed:  none  on file in [facility]  [facility] needs: Follow-up with DSS Barriers to dispo / significant pending items: Guardianship              Subjective / Brief ROS:  No concerns today    Family Communication: none at this time     Objective Findings:  Vitals:   02/17/24 1801 02/17/24 1934 02/18/24 0351 02/18/24 0907  BP: 94/66  94/78 91/65 94/72   Pulse: 95  92 98  Resp: 18 18 16 18   Temp: 98.7 F (37.1 C) 98.3 F (36.8 C) 98.6 F (37 C) 98.4 F (36.9 C)  TempSrc:  Oral Oral Oral  SpO2: 99% 99% 98% 100%  Weight:      Height:        Intake/Output Summary (Last 24 hours) at 02/18/2024 1258 Last data filed at 02/18/2024 0900 Gross per 24 hour  Intake 480 ml  Output --  Net 480 ml   Filed Weights   02/13/24 0713 02/15/24 0500 02/16/24 0422  Weight: 72.1 kg 70.3 kg 71.4 kg    Examination:  Physical Exam Constitutional:      General: She is not in acute distress. Pulmonary:     Effort: Pulmonary effort is normal.  Neurological:     Mental Status: She is alert. Mental status is at baseline.  Psychiatric:        Behavior: Behavior normal.          Scheduled Medications:   atovaquone  1,500 mg Oral Q breakfast   azithromycin  1,200 mg Oral Weekly   bictegravir-emtricitabine-tenofovir AF  1 tablet Oral Daily   docusate sodium  100 mg Oral BID   DULoxetine  30 mg Oral QHS   enoxaparin (LOVENOX) injection  40 mg Subcutaneous QHS   feeding supplement  237 mL Oral QID   folic acid  1 mg Oral Daily   hydrocerin   Topical BID   lidocaine  1 patch Transdermal Q24H   midodrine  10 mg Oral TID WC   multivitamin with minerals  1 tablet Oral QHS   pantoprazole  40 mg Oral BID   thiamine  100 mg Oral Daily    Continuous Infusions:   PRN Medications:  acetaminophen, cyclobenzaprine, dicyclomine, diphenhydrAMINE, hydrocortisone, ipratropium-albuterol, lactulose, loperamide, nicotine polacrilex, ondansetron, mouth rinse, oxyCODONE, polyethylene glycol  Antimicrobials from admission:  Anti-infectives (From admission, onward)    Start     Dose/Rate Route Frequency Ordered Stop   01/25/24 0000  azithromycin (ZITHROMAX) 600 MG tablet        1,200 mg Oral Weekly 01/25/24 1610     01/25/24 0000  bictegravir-emtricitabine-tenofovir AF (BIKTARVY) 50-200-25 MG TABS tablet        1 tablet Oral Daily  01/25/24 1610     01/25/24 0000  atovaquone (MEPRON) 750 MG/5ML suspension        1,500 mg Oral Daily with breakfast 01/25/24 1610     12/25/23 0800  atovaquone (MEPRON) 750 MG/5ML suspension 1,500 mg        1,500 mg Oral Daily with breakfast 12/24/23 1558     12/24/23 1000  sulfamethoxazole-trimethoprim (BACTRIM) 400-80 MG per tablet 1 tablet  Status:  Discontinued        1 tablet Oral Daily 12/24/23 0749 12/24/23 1557   12/19/23 1400  azithromycin (ZITHROMAX) tablet 1,200 mg        1,200 mg Oral Weekly 12/18/23 1346     12/18/23 1000  sulfamethoxazole-trimethoprim (BACTRIM DS) 800-160 MG per tablet 1 tablet  Status:  Discontinued        1 tablet Oral Daily 12/17/23 1621 12/24/23 0748   12/14/23 1530  bictegravir-emtricitabine-tenofovir AF (BIKTARVY) 50-200-25 MG per tablet 1 tablet        1 tablet Oral Daily 12/14/23 1442     12/12/23 1000  amoxicillin-clavulanate (AUGMENTIN) 875-125 MG per tablet 1 tablet        1 tablet Oral Every 12 hours 12/11/23 1345 12/12/23 2131   12/12/23 1000  azithromycin (ZITHROMAX) tablet 1,250 mg  Status:  Discontinued        1,200 mg Oral Weekly 12/11/23 1618 12/18/23 1346   12/11/23 2000  sulfamethoxazole-trimethoprim (BACTRIM) 400-80 MG per tablet 1 tablet  Status:  Discontinued        1 tablet Oral Daily 12/11/23 1618 12/17/23 1621   12/09/23 2200  azithromycin (ZITHROMAX) 500 mg in sodium chloride 0.9 % 250 mL IVPB  Status:  Discontinued        500 mg 250 mL/hr over 60 Minutes Intravenous Every 24 hours 12/09/23 0734 12/11/23 1618   12/09/23 2000  cefTRIAXone (ROCEPHIN) 2 g in sodium chloride 0.9 % 100 mL IVPB  Status:  Discontinued        2 g 200 mL/hr over 30 Minutes Intravenous Every 24 hours 12/09/23 0953 12/09/23 1840   12/09/23 2000  piperacillin-tazobactam (ZOSYN) IVPB 3.375 g        3.375 g 12.5 mL/hr over 240 Minutes Intravenous Every 8 hours 12/09/23 1850 12/12/23 0037   12/09/23 0600  Ampicillin-Sulbactam (UNASYN) 3 g in sodium chloride 0.9  % 100 mL IVPB  Status:  Discontinued        3 g 200 mL/hr over 30 Minutes Intravenous Every 6 hours 12/09/23 0442 12/09/23 0953   12/08/23 2215  cefTRIAXone (ROCEPHIN) 2 g in sodium chloride 0.9 % 100 mL IVPB        2 g 200 mL/hr over 30 Minutes Intravenous Once 12/08/23 2214 12/08/23 2308   12/08/23 2215  azithromycin (ZITHROMAX) 500 mg in sodium chloride 0.9 % 250 mL IVPB        500 mg 250 mL/hr over 60 Minutes Intravenous  Once 12/08/23 2214 12/09/23 0000           Data Reviewed:  I have personally reviewed the following...  CBC: Recent Labs  Lab 02/12/24 0509 02/14/24 1525 02/15/24 0524  WBC 4.7 4.1 4.6  NEUTROABS 1.2* 1.3*  --   HGB 8.8* 8.9* 9.0*  HCT 26.8* 27.8* 27.6*  MCV 85.6 84 86.0  PLT 223 245 220   Basic Metabolic Panel: Recent Labs  Lab 02/12/24 0509  NA 136  K 3.9  CL 105  CO2 24  GLUCOSE 85  BUN 20  CREATININE 0.75  CALCIUM 9.5   GFR: Estimated Creatinine Clearance: 85.8 mL/min (by C-G formula based on SCr of 0.75 mg/dL). Liver Function Tests: No results for input(s): "AST", "ALT", "ALKPHOS", "BILITOT", "PROT", "ALBUMIN" in the last 168 hours. No results for input(s): "LIPASE", "AMYLASE" in the last 168 hours. No results for input(s): "AMMONIA" in the last 168 hours. Coagulation Profile: No results for input(s): "INR", "PROTIME" in the last 168 hours. Cardiac Enzymes: No results for input(s): "CKTOTAL", "CKMB", "CKMBINDEX", "TROPONINI" in the last 168 hours. BNP (last 3 results) No results for input(s): "PROBNP" in the last 8760 hours. HbA1C: No results for input(s): "HGBA1C" in the last 72 hours. CBG: No results for input(s): "GLUCAP" in the last 168 hours. Lipid Profile: No results for input(s): "CHOL", "HDL", "LDLCALC", "  TRIG", "CHOLHDL", "LDLDIRECT" in the last 72 hours. Thyroid Function Tests: No results for input(s): "TSH", "T4TOTAL", "FREET4", "T3FREE", "THYROIDAB" in the last 72 hours. Anemia Panel: No results for input(s):  "VITAMINB12", "FOLATE", "FERRITIN", "TIBC", "IRON", "RETICCTPCT" in the last 72 hours. Most Recent Urinalysis On File:     Component Value Date/Time   COLORURINE YELLOW (A) 01/21/2024 1215   APPEARANCEUR HAZY (A) 01/21/2024 1215   APPEARANCEUR Clear 08/18/2014 1354   LABSPEC 1.023 01/21/2024 1215   LABSPEC 1.020 08/18/2014 1354   PHURINE 7.0 01/21/2024 1215   GLUCOSEU NEGATIVE 01/21/2024 1215   GLUCOSEU 50 mg/dL 27/25/3664 4034   HGBUR NEGATIVE 01/21/2024 1215   BILIRUBINUR NEGATIVE 01/21/2024 1215   BILIRUBINUR Negative 08/18/2014 1354   KETONESUR NEGATIVE 01/21/2024 1215   PROTEINUR NEGATIVE 01/21/2024 1215   NITRITE NEGATIVE 01/21/2024 1215   LEUKOCYTESUR MODERATE (A) 01/21/2024 1215   LEUKOCYTESUR Negative 08/18/2014 1354   Sepsis Labs: @LABRCNTIP (procalcitonin:4,lacticidven:4) Microbiology: No results found for this or any previous visit (from the past 240 hours).    Radiology Studies last 3 days: No results found.     Time spent: 25  min    Sunnie Nielsen, DO [facility] 02/18/2024, 12:58 PM    Dictation software may have been used to generate the above note. Typos may occur and escape review in typed/dictated notes. Please contact Dr Lyn Hollingshead directly for clarity if needed.  Staff may message me via secure chat in Epic  but this may not receive an immediate response,  please page me for urgent matters!  If 7PM-7AM, please contact night coverage [email or web address]

## 2024-02-19 NOTE — Plan of Care (Signed)
  Problem: Coping: Goal: Ability to adjust to condition or change in health will improve Outcome: Progressing   Problem: Fluid Volume: Goal: Ability to maintain a balanced intake and output will improve Outcome: Progressing   Problem: Health Behavior/Discharge Planning: Goal: Ability to identify and utilize available resources and services will improve Outcome: Progressing Goal: Ability to manage health-related needs will improve Outcome: Progressing   Problem: Metabolic: Goal: Ability to maintain appropriate glucose levels will improve Outcome: Progressing   Problem: Nutritional: Goal: Maintenance of adequate nutrition will improve Outcome: Progressing Goal: Progress toward achieving an optimal weight will improve Outcome: Progressing   Problem: Skin Integrity: Goal: Risk for impaired skin integrity will decrease Outcome: Progressing   Problem: Tissue Perfusion: Goal: Adequacy of tissue perfusion will improve Outcome: Progressing   Problem: Education: Goal: Knowledge of General Education information will improve Description: Including pain rating scale, medication(s)/side effects and non-pharmacologic comfort measures Outcome: Progressing   Problem: Health Behavior/Discharge Planning: Goal: Ability to manage health-related needs will improve Outcome: Progressing   Problem: Clinical Measurements: Goal: Ability to maintain clinical measurements within normal limits will improve Outcome: Progressing Goal: Will remain free from infection Outcome: Progressing Goal: Diagnostic test results will improve Outcome: Progressing Goal: Respiratory complications will improve Outcome: Progressing Goal: Cardiovascular complication will be avoided Outcome: Progressing   Problem: Activity: Goal: Risk for activity intolerance will decrease Outcome: Progressing   Problem: Nutrition: Goal: Adequate nutrition will be maintained Outcome: Progressing   Problem: Coping: Goal:  Level of anxiety will decrease Outcome: Progressing   Problem: Elimination: Goal: Will not experience complications related to bowel motility Outcome: Progressing Goal: Will not experience complications related to urinary retention Outcome: Progressing   Problem: Pain Management: Goal: General experience of comfort will improve Outcome: Progressing   Problem: Safety: Goal: Ability to remain free from injury will improve Outcome: Progressing   Problem: Skin Integrity: Goal: Risk for impaired skin integrity will decrease Outcome: Progressing   Problem: Activity: Goal: Ability to tolerate increased activity will improve Outcome: Progressing   Problem: Respiratory: Goal: Ability to maintain a clear airway and adequate ventilation will improve Outcome: Progressing

## 2024-02-19 NOTE — Progress Notes (Addendum)
 PROGRESS NOTE    Meghan Jarvis   ZOX:[id number] DOB: 05-19-1976  DOA: 12/08/2023 Date of Service: 02/19/24 which is hospital day 36  PCP: [facility], [facility] course / significant events:  48yo with h/o polysubstance (ETOH, cocaine) abuse, afib, homelessness, AIDS, hypothyroidism, and DM who presented on 12/08/23 with acute respiratory failure.  She was found down by a friend and could not wake her up.  She was found to be hypoxic in the 70s. Admitted to ICU, intubated, started on antibiotics for pneumonia.  12/31: extubated. ID consulted.  MRI brain with advanced cerebral atrophy.  Psych evaluated - "Patient is lacking and reasoning in her medical care for example she is unable to describe the consequence that she will have if she does not take her medications or has complications with her chronic conditions" and needs assistance with housing and finances due to cognitive decline.  APS/DSS pending guardianship and will need placement.   Consultants:  infectious disease Psychiatry  Procedures/Surgeries: none      ASSESSMENT & PLAN:   Note: Patient is medically stable and awaiting DSS guardianship for placement.     HIV Associated Neurocognitive Disorder Mental status improved from admission but patient is a poor historian and does not have capacity to make decisions at this time per psychiatry DSS referral by [facility] MRI did not show any enhancing lesions but did show severe cerebral atrophy - likely HIV associated neurocognitive disorder DSS is working on obtaining guardianship Patient should not be discharged prior to DSS approval per hospital leadership The patient is currently unsure what her discharge plan will be (generally names different family members daily, but none today)   Pain Has complained of back, knee, hip, and abdominal pain during hospitalization Exam nonspecific Neg XR L hip, lumbar spine, R knee, CT A/P, MR lumbar and thoracic spine, L  hand/wrist, R elbow, L shoulder, CT C spine PT/OT consulted and suggest home PT vs. 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  Viral load 2 million, CD4 14 on admission Infectious disease following, started Biktarvy Placed on PCP and MAI prophylaxis with atovaquone and Zithromax Toxoplasma antibody IgG came back elevated but MRI of the brain does not show any enhancing lesions Repeat viral load down to 34,300 Infectious disease on board    Pancytopenia  Secondary to HIV Platelet count and WBC have normalized Hgb is stable at last check Monitor periodic CBC   Cocaine abuse  Patient has been counseled on cessation of cocaine use   Alcohol abuse Continue thiamine, multivitamin, and folic acid        No concerns based on BMI: Body mass index is 24.17 kg/m.  Underweight - under 18  overweight - 25 to 29 obese - 30 or more Class 1 obesity: BMI of 30.0 to 34 Class 2 obesity: BMI of 35.0 to 39 Class 3 obesity: BMI of 40.0 to 49 Super Morbid Obesity: BMI 50-59 Super-super Morbid Obesity: BMI 60+ Significantly low or high BMI is associated with higher medical risk.  Weight management advised as adjunct to other disease management and risk reduction treatments    DVT prophylaxis: lovenox IV fluids: no continuous IV fluids  Nutrition: regular diet  Central lines / invasive devices: none  Code Status: FULL CODE ACP documentation reviewed:  none  on file in [facility]  [facility] needs: Follow-up with DSS Barriers to dispo / significant pending items: Guardianship              Subjective / Brief ROS:  No concerns today    Family Communication: none at this time     Objective Findings:  Vitals:   02/18/24 1644 02/18/24 2032 02/19/24 0339 02/19/24 0853  BP:  (!)  88/58 94/71 (!) 85/53  Pulse: 88 81 93 94  Resp: 18 20 17 18   Temp: 98.5 F (36.9 C) (!) 97.5 F (36.4 C) 98.6 F (37 C) 98.9 F (37.2 C)  TempSrc: Oral Oral  Oral  SpO2: 100% 100% 100% 100%  Weight:      Height:       No intake or output data in the 24 hours ending 02/19/24 1610  Filed Weights   02/13/24 0713 02/15/24 0500 02/16/24 0422  Weight: 72.1 kg 70.3 kg 71.4 kg    Examination:  Physical Exam Constitutional:      General: She is not in acute distress. Pulmonary:     Effort: Pulmonary effort is normal.  Neurological:     Mental Status: She is alert. Mental status is at baseline.  Psychiatric:        Behavior: Behavior normal.          Scheduled Medications:   atovaquone  1,500 mg Oral Q breakfast   azithromycin  1,200 mg Oral Weekly   bictegravir-emtricitabine-tenofovir AF  1 tablet Oral Daily   docusate sodium  100 mg Oral BID   DULoxetine  30 mg Oral QHS   enoxaparin (LOVENOX) injection  40 mg Subcutaneous QHS   folic acid  1 mg Oral Daily   hydrocerin   Topical BID   lidocaine  1 patch Transdermal Q24H   midodrine  10 mg Oral TID WC   multivitamin with minerals  1 tablet Oral QHS   pantoprazole  40 mg Oral BID   thiamine  100 mg Oral Daily    Continuous Infusions:   PRN Medications:  acetaminophen, cyclobenzaprine, dicyclomine, diphenhydrAMINE, hydrocortisone, ipratropium-albuterol, lactulose, loperamide, nicotine polacrilex, ondansetron, mouth rinse, oxyCODONE, polyethylene glycol  Antimicrobials from admission:  Anti-infectives (From admission, onward)    Start     Dose/Rate Route Frequency Ordered Stop   01/25/24 0000  azithromycin (ZITHROMAX) 600 MG tablet        1,200 mg Oral Weekly 01/25/24 1610     01/25/24 0000  bictegravir-emtricitabine-tenofovir AF (BIKTARVY) 50-200-25 MG TABS tablet        1 tablet Oral Daily 01/25/24 1610     01/25/24 0000  atovaquone (MEPRON) 750 MG/5ML suspension        1,500 mg Oral Daily with breakfast  01/25/24 1610     12/25/23 0800  atovaquone (MEPRON) 750 MG/5ML suspension 1,500 mg        1,500 mg Oral Daily with breakfast 12/24/23 1558     12/24/23 1000  sulfamethoxazole-trimethoprim (BACTRIM) 400-80 MG per tablet 1 tablet  Status:  Discontinued        1 tablet Oral Daily 12/24/23 0749 12/24/23 1557   12/19/23 1400  azithromycin (ZITHROMAX) tablet 1,200 mg        1,200 mg Oral Weekly 12/18/23 1346     12/18/23 1000  sulfamethoxazole-trimethoprim (BACTRIM DS) 800-160 MG per tablet 1 tablet  Status:  Discontinued        1 tablet Oral Daily 12/17/23 1621 12/24/23 0748   12/14/23 1530  bictegravir-emtricitabine-tenofovir AF (BIKTARVY) 50-200-25  MG per tablet 1 tablet        1 tablet Oral Daily 12/14/23 1442     12/12/23 1000  amoxicillin-clavulanate (AUGMENTIN) 875-125 MG per tablet 1 tablet        1 tablet Oral Every 12 hours 12/11/23 1345 12/12/23 2131   12/12/23 1000  azithromycin (ZITHROMAX) tablet 1,250 mg  Status:  Discontinued        1,200 mg Oral Weekly 12/11/23 1618 12/18/23 1346   12/11/23 2000  sulfamethoxazole-trimethoprim (BACTRIM) 400-80 MG per tablet 1 tablet  Status:  Discontinued        1 tablet Oral Daily 12/11/23 1618 12/17/23 1621   12/09/23 2200  azithromycin (ZITHROMAX) 500 mg in sodium chloride 0.9 % 250 mL IVPB  Status:  Discontinued        500 mg 250 mL/hr over 60 Minutes Intravenous Every 24 hours 12/09/23 0734 12/11/23 1618   12/09/23 2000  cefTRIAXone (ROCEPHIN) 2 g in sodium chloride 0.9 % 100 mL IVPB  Status:  Discontinued        2 g 200 mL/hr over 30 Minutes Intravenous Every 24 hours 12/09/23 0953 12/09/23 1840   12/09/23 2000  piperacillin-tazobactam (ZOSYN) IVPB 3.375 g        3.375 g 12.5 mL/hr over 240 Minutes Intravenous Every 8 hours 12/09/23 1850 12/12/23 0037   12/09/23 0600  Ampicillin-Sulbactam (UNASYN) 3 g in sodium chloride 0.9 % 100 mL IVPB  Status:  Discontinued        3 g 200 mL/hr over 30 Minutes Intravenous Every 6 hours 12/09/23 0442  12/09/23 0953   12/08/23 2215  cefTRIAXone (ROCEPHIN) 2 g in sodium chloride 0.9 % 100 mL IVPB        2 g 200 mL/hr over 30 Minutes Intravenous Once 12/08/23 2214 12/08/23 2308   12/08/23 2215  azithromycin (ZITHROMAX) 500 mg in sodium chloride 0.9 % 250 mL IVPB        500 mg 250 mL/hr over 60 Minutes Intravenous  Once 12/08/23 2214 12/09/23 0000           Data Reviewed:  I have personally reviewed the following...  CBC: Recent Labs  Lab 02/14/24 1525 02/15/24 0524  WBC 4.1 4.6  NEUTROABS 1.3*  --   HGB 8.9* 9.0*  HCT 27.8* 27.6*  MCV 84 86.0  PLT 245 220   Basic Metabolic Panel: No results for input(s): "NA", "K", "CL", "CO2", "GLUCOSE", "BUN", "CREATININE", "CALCIUM", "MG", "PHOS" in the last 168 hours.  GFR: Estimated Creatinine Clearance: 85.8 mL/min (by C-G formula based on SCr of 0.75 mg/dL). Liver Function Tests: No results for input(s): "AST", "ALT", "ALKPHOS", "BILITOT", "PROT", "ALBUMIN" in the last 168 hours. No results for input(s): "LIPASE", "AMYLASE" in the last 168 hours. No results for input(s): "AMMONIA" in the last 168 hours. Coagulation Profile: No results for input(s): "INR", "PROTIME" in the last 168 hours. Cardiac Enzymes: No results for input(s): "CKTOTAL", "CKMB", "CKMBINDEX", "TROPONINI" in the last 168 hours. BNP (last 3 results) No results for input(s): "PROBNP" in the last 8760 hours. HbA1C: No results for input(s): "HGBA1C" in the last 72 hours. CBG: No results for input(s): "GLUCAP" in the last 168 hours. Lipid Profile: No results for input(s): "CHOL", "HDL", "LDLCALC", "TRIG", "CHOLHDL", "LDLDIRECT" in the last 72 hours. Thyroid Function Tests: No results for input(s): "TSH", "T4TOTAL", "FREET4", "T3FREE", "THYROIDAB" in the last 72 hours. Anemia Panel: No results for input(s): "VITAMINB12", "FOLATE", "FERRITIN", "TIBC", "IRON", "RETICCTPCT" in the last 72 hours. Most Recent Urinalysis  On File:     Component Value Date/Time    COLORURINE YELLOW (A) 01/21/2024 1215   APPEARANCEUR HAZY (A) 01/21/2024 1215   APPEARANCEUR Clear 08/18/2014 1354   LABSPEC 1.023 01/21/2024 1215   LABSPEC 1.020 08/18/2014 1354   PHURINE 7.0 01/21/2024 1215   GLUCOSEU NEGATIVE 01/21/2024 1215   GLUCOSEU 50 mg/dL 16/09/9603 5409   HGBUR NEGATIVE 01/21/2024 1215   BILIRUBINUR NEGATIVE 01/21/2024 1215   BILIRUBINUR Negative 08/18/2014 1354   KETONESUR NEGATIVE 01/21/2024 1215   PROTEINUR NEGATIVE 01/21/2024 1215   NITRITE NEGATIVE 01/21/2024 1215   LEUKOCYTESUR MODERATE (A) 01/21/2024 1215   LEUKOCYTESUR Negative 08/18/2014 1354   Sepsis Labs: @LABRCNTIP (procalcitonin:4,lacticidven:4) Microbiology: Recent Results (from the past 240 hours)  Acid Fast Smear (AFB)     Status: None   Collection Time: 02/15/24  5:24 AM   Specimen: Vein; Blood  Result Value Ref Range Status   AFB Specimen Processing Concentration  Final    Comment: (NOTE) Performed At: [facility] [address] [place], [place] [id number] Jolene Schimke MD NF:[id number]    Acid Fast Smear QNSAFB  Final    Comment: (NOTE) Test not performed. AFB Smear not performed due to specimen source (blood) or insufficient specimen.    Source (AFB) WHOLE BLOOD  Final    Comment: Performed at [facility], [address] [place], [place] [address]  Acid Fast Smear (AFB)     Status: None   Collection Time: 02/15/24  5:25 AM   Specimen: Vein; Blood  Result Value Ref Range Status   AFB Specimen Processing Concentration  Final    Comment: (NOTE) Performed At: [facility] [address] [place], [place] [id number] Jolene Schimke MD MW:[id number]    Acid Fast Smear QNSAFB  Final    Comment: (NOTE) Test not performed. AFB Smear not performed due to specimen source (blood) or insufficient specimen.    Source (AFB) WHOLE BLOOD  Final    Comment: Performed at [facility], [address] [place], [place] [address]      Radiology  Studies last 3 days: No results found.     Time spent: 25  min    Sunnie Nielsen, DO [facility] 02/19/2024, 4:10 PM    Dictation software may have been used to generate the above note. Typos may occur and escape review in typed/dictated notes. Please contact Dr Lyn Hollingshead directly for clarity if needed.  Staff may message me via secure chat in Epic  but this may not receive an immediate response,  please page me for urgent matters!  If 7PM-7AM, please contact night coverage [email or web address]

## 2024-02-19 NOTE — TOC Progression Note (Signed)
 Transition of Care [facility]) - Progression Note    Patient Details  Name: Meghan Jarvis MRN: [id number] Date of Birth: 1976-11-10  Transition of Care [facility]) CM/SW Contact  Chapman Fitch, RN Phone Number: 02/19/2024, 3:37 PM  Clinical Narrative:     Dss guardianship pending       Expected Discharge Plan and Services         Expected Discharge Date: 01/10/24                                     Social Determinants of Health (SDOH) Interventions SDOH Screenings   Food Insecurity: Patient Unable To Answer (12/09/2023)  Recent Concern: Food Insecurity - Food Insecurity Present (09/26/2023)  Housing: Patient Unable To Answer (12/09/2023)  Recent Concern: Housing - Medium Risk (09/26/2023)  Transportation Needs: Patient Unable To Answer (12/09/2023)  Recent Concern: Transportation Needs - Unmet Transportation Needs (10/11/2023)  Utilities: Patient Unable To Answer (12/09/2023)  Recent Concern: Utilities - At Risk (09/25/2023)  Tobacco Use: High Risk (12/08/2023)    Readmission Risk Interventions     No data to display

## 2024-02-19 NOTE — Plan of Care (Signed)
  Problem: Coping: Goal: Ability to adjust to condition or change in health will improve Outcome: Progressing   Problem: Health Behavior/Discharge Planning: Goal: Ability to identify and utilize available resources and services will improve Outcome: Progressing   Problem: Nutritional: Goal: Maintenance of adequate nutrition will improve Outcome: Progressing   Problem: Safety: Goal: Ability to remain free from injury will improve Outcome: Progressing   Problem: Pain Management: Goal: General experience of comfort will improve Outcome: Progressing

## 2024-02-20 DIAGNOSIS — F02818 Dementia in other diseases classified elsewhere, unspecified severity, with other behavioral disturbance: Secondary | ICD-10-CM | POA: Diagnosis not present

## 2024-02-20 DIAGNOSIS — B20 Human immunodeficiency virus [HIV] disease: Secondary | ICD-10-CM | POA: Diagnosis not present

## 2024-02-20 LAB — BASIC METABOLIC PANEL
Anion gap: 8 (ref 5–15)
BUN: 12 mg/dL (ref 6–20)
CO2: 20 mmol/L — ABNORMAL LOW (ref 22–32)
Calcium: 9.3 mg/dL (ref 8.9–10.3)
Chloride: 108 mmol/L (ref 98–111)
Creatinine, Ser: 0.74 mg/dL (ref 0.44–1.00)
GFR, Estimated: >60 mL/min (ref >60)
Glucose, Bld: 96 mg/dL (ref 70–99)
Potassium: 3.6 mmol/L (ref 3.5–5.1)
Sodium: 136 mmol/L (ref 135–145)

## 2024-02-20 LAB — CBC
HCT: 31.4 % — ABNORMAL LOW (ref 36.0–46.0)
Hemoglobin: 10.1 g/dL — ABNORMAL LOW (ref 12.0–15.0)
MCH: 27.7 pg (ref 26.0–34.0)
MCHC: 32.2 g/dL (ref 30.0–36.0)
MCV: 86 fL (ref 80.0–100.0)
Platelets: 254 10*3/uL (ref 150–400)
RBC: 3.65 MIL/uL — ABNORMAL LOW (ref 3.87–5.11)
RDW: 14.6 % (ref 11.5–15.5)
WBC: 4.9 10*3/uL (ref 4.0–10.5)
nRBC: 0 % (ref 0.0–0.2)

## 2024-02-20 NOTE — Progress Notes (Signed)
 Progress Note   Patient: Meghan Jarvis:[id number] DOB: Apr 04, 1976 DOA: 12/08/2023     74 DOS: the patient was seen and examined on 02/20/2024    Hospital course / significant events:  48yo with h/o polysubstance (ETOH, cocaine) abuse, afib, homelessness, AIDS, hypothyroidism, and DM who presented on 12/08/23 with acute respiratory failure.  She was found down by a friend and could not wake her up.  She was found to be hypoxic in the 70s. Admitted to ICU, intubated, started on antibiotics for pneumonia.  12/31: extubated. ID consulted.  MRI brain with advanced cerebral atrophy.  Psych evaluated - "Patient is lacking and reasoning in her medical care for example she is unable to describe the consequence that she will have if she does not take her medications or has complications with her chronic conditions" and needs assistance with housing and finances due to cognitive decline.  APS/DSS pending guardianship and will need placement.     Consultants:  infectious disease Psychiatry   Procedures/Surgeries: none           ASSESSMENT & PLAN:   Note: Patient is medically stable and awaiting DSS guardianship for placement.     HIV Associated Neurocognitive Disorder Mental status improved from admission but patient is a poor historian and does not have capacity to make decisions at this time per psychiatry DSS referral by [facility] MRI did not show any enhancing lesions but did show severe cerebral atrophy - likely HIV associated neurocognitive disorder DSS working on obtaining guardianship Patient should not be discharged prior to DSS approval per hospital leadership The patient is currently unsure what her discharge plan will be (generally names different family members daily, but none today)   Pain Has complained of back, knee, hip, and abdominal pain during hospitalization Exam nonspecific Neg XR L hip, lumbar spine, R knee, CT A/P, MR lumbar and thoracic spine, L hand/wrist, R elbow,  L shoulder, CT C spine PT/OT consulted and suggest home PT vs. 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  Viral load 2 million, CD4 14 on admission Infectious disease following, started Biktarvy Placed on PCP and MAI prophylaxis with atovaquone and Zithromax Toxoplasma antibody IgG came back elevated but MRI of the brain does not show any enhancing lesions Repeat viral load down to 34,300 Infectious disease on board    Pancytopenia  Secondary to HIV Platelet count and WBC have normalized Hgb is stable at last check Monitor periodic CBC   Cocaine abuse  Patient has been counseled on cessation of cocaine use   Alcohol abuse Continue thiamine, multivitamin, and folic acid     DVT prophylaxis: lovenox    Code Status: FULL CODE    TOC needs: Follow-up with DSS  Barriers to dispo / significant pending items: Guardianship    Subjective / Brief ROS:  Seen and examined at bedside this morning denies any acute overnight events or complaints     Family Communication: none at this time      Physical Exam Constitutional:      General: She is not in acute distress. Pulmonary:     Effort: Pulmonary effort is normal.  Neurological:     Mental Status: She is alert. Mental status is at baseline.  Psychiatric:        Behavior: Behavior normal.  Data reviewed:    Latest Ref Rng & Units 02/20/2024    5:57 AM 02/15/2024    5:24 AM 02/14/2024    3:25 PM  CBC  WBC 4.0 - 10.5 K/uL 4.9  4.6  4.1   Hemoglobin 12.0 - 15.0 g/dL 59.5  9.0  8.9   Hematocrit 36.0 - 46.0 % 31.4  27.6  27.8   Platelets 150 - 400 K/uL 254  220  245        Latest Ref Rng & Units 02/20/2024    5:57 AM 02/12/2024    5:09 AM 02/05/2024    5:52 AM  BMP  Glucose 70 - 99 mg/dL 96  85  86   BUN 6 - 20 mg/dL  12  20  32   Creatinine 0.44 - 1.00 mg/dL 6.38  7.56  4.33   Sodium 135 - 145 mmol/L 136  136  137   Potassium 3.5 - 5.1 mmol/L 3.6  3.9  4.3   Chloride 98 - 111 mmol/L 108  105  105   CO2 22 - 32 mmol/L 20  24  24    Calcium 8.9 - 10.3 mg/dL 9.3  9.5  9.7       Vitals:   02/19/24 2023 02/20/24 0520 02/20/24 0710 02/20/24 1557  BP: 92/67 95/66 91/64  98/71  Pulse: 88 94  90  Resp: 17 20 18 18   Temp: 98 F (36.7 C) 98.1 F (36.7 C) 98.6 F (37 C) 98.4 F (36.9 C)  TempSrc: Oral Oral  Oral  SpO2: 100% 98% 100% 100%  Weight:      Height:         Author: Loyce Dys, MD 02/20/2024 3:58 PM  For on call review [email or web address].

## 2024-02-21 DIAGNOSIS — F02818 Dementia in other diseases classified elsewhere, unspecified severity, with other behavioral disturbance: Secondary | ICD-10-CM | POA: Diagnosis not present

## 2024-02-21 DIAGNOSIS — B20 Human immunodeficiency virus [HIV] disease: Secondary | ICD-10-CM | POA: Diagnosis not present

## 2024-02-21 DIAGNOSIS — F1410 Cocaine abuse, uncomplicated: Secondary | ICD-10-CM | POA: Diagnosis not present

## 2024-02-21 NOTE — Progress Notes (Signed)
 Progress Note   Patient: Meghan Jarvis WUJ:[id number] DOB: 1976-05-20 DOA: 12/08/2023     75 DOS: the patient was seen and examined on 02/21/2024   Hospital course / significant events:  48yo with h/o polysubstance (ETOH, cocaine) abuse, afib, homelessness, AIDS, hypothyroidism, and DM who presented on 12/08/23 with acute respiratory failure.  She was found down by a friend and could not wake her up.  She was found to be hypoxic in the 70s. Admitted to ICU, intubated, started on antibiotics for pneumonia.  12/31: extubated. ID consulted.  MRI brain with advanced cerebral atrophy.  Psych evaluated - "Patient is lacking and reasoning in her medical care for example she is unable to describe the consequence that she will have if she does not take her medications or has complications with her chronic conditions" and needs assistance with housing and finances due to cognitive decline.  APS/DSS pending guardianship and will need placement.     Consultants:  infectious disease Psychiatry   Procedures/Surgeries: none    ASSESSMENT & PLAN:   Note: Patient is medically stable and awaiting DSS guardianship for placement.     HIV Associated Neurocognitive Disorder Mental status improved from admission but patient is a poor historian and does not have capacity to make decisions at this time per psychiatry DSS referral by [facility] MRI did not show any enhancing lesions but did show severe cerebral atrophy - likely HIV associated neurocognitive disorder DSS working on obtaining guardianship Patient should not be discharged prior to DSS approval per hospital leadership The patient is currently unsure what her discharge plan will be (generally names different family members daily, but none today)   Pain Has complained of back, knee, hip, and abdominal pain during hospitalization Exam nonspecific Neg XR L hip, lumbar spine, R knee, CT A/P, MR lumbar and thoracic spine, L hand/wrist, R elbow, L shoulder,  CT C spine PT/OT consulted and suggest home PT vs. 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  Viral load 2 million, CD4 14 on admission Infectious disease following, started Biktarvy Continue on PCP and MAI prophylaxis with atovaquone and Zithromax Toxoplasma antibody IgG came back elevated but MRI of the brain does not show any enhancing lesions Repeat viral load down to 34,300 Infectious disease on board    Pancytopenia  Secondary to HIV Platelet count and WBC have normalized Hgb is stable at last check Monitor CBC periodically   Cocaine abuse  Patient has been counseled on cessation of cocaine use   Alcohol abuse Continue thiamine, multivitamin, and folic acid     DVT prophylaxis: lovenox     Code Status: FULL CODE     TOC needs: Follow-up with DSS   Barriers to dispo / significant pending items: Guardianship     Subjective / Brief ROS:  Patient seen and examined in the room in the presence of the boyfriend She denies nausea vomiting abdominal pain chest pain or cough Case discussed with transition of care manager who is still working on placement awaiting guardianship     Family Communication: none at this time      Physical Exam Constitutional:      General: She is not in acute distress. Pulmonary:     Effort: Pulmonary effort is normal.  Neurological:     Mental Status: She is alert.  Mental status is at baseline.  Psychiatric:        Behavior: Behavior normal.        Data reviewed:    Latest Ref Rng & Units 02/20/2024    5:57 AM 02/12/2024    5:09 AM 02/05/2024    5:52 AM  BMP  Glucose 70 - 99 mg/dL 96  85  86   BUN 6 - 20 mg/dL 12  20  32   Creatinine 0.44 - 1.00 mg/dL 1.61  0.96  0.45   Sodium 135 - 145 mmol/L 136  136  137   Potassium 3.5 - 5.1  mmol/L 3.6  3.9  4.3   Chloride 98 - 111 mmol/L 108  105  105   CO2 22 - 32 mmol/L 20  24  24    Calcium 8.9 - 10.3 mg/dL 9.3  9.5  9.7     Vitals:   02/21/24 0437 02/21/24 0441 02/21/24 0838 02/21/24 1424  BP: 99/77  (!) 89/50 (!) 89/59  Pulse: 97  95 74  Resp: 19  20 18   Temp: 98.4 F (36.9 C)  (!) 97.4 F (36.3 C) 97.6 F (36.4 C)  TempSrc: Oral  Oral Oral  SpO2: 100%  100% 100%  Weight:  67.8 kg    Height:          Latest Ref Rng & Units 02/20/2024    5:57 AM 02/15/2024    5:24 AM 02/14/2024    3:25 PM  CBC  WBC 4.0 - 10.5 K/uL 4.9  4.6  4.1   Hemoglobin 12.0 - 15.0 g/dL 40.9  9.0  8.9   Hematocrit 36.0 - 46.0 % 31.4  27.6  27.8   Platelets 150 - 400 K/uL 254  220  245      Author: Loyce Dys, MD 02/21/2024 4:57 PM  For on call review [email or web address].

## 2024-02-21 NOTE — Progress Notes (Addendum)
 Date of Admission:  12/08/2023     ID: Meghan Jarvis is a 48 y.o. 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 Meghan Jarvis is a 48 y.o. with a history of AIDS, not compliant with meds or visits ,  , cocaine use was brought in by EMS after being found unresponsive on the couch  in an aquaintance place-   Subjective: C/o rt hip pain Otherwise okay Medications:   atovaquone  1,500 mg Oral Q breakfast   azithromycin  1,200 mg Oral Weekly   bictegravir-emtricitabine-tenofovir AF  1 tablet Oral Daily   docusate sodium  100 mg Oral BID   DULoxetine  30 mg Oral QHS   enoxaparin (LOVENOX) injection  40 mg Subcutaneous QHS   folic acid  1 mg Oral Daily   hydrocerin   Topical BID   lidocaine  1 patch Transdermal Q24H   midodrine  10 mg Oral TID WC   multivitamin with minerals  1 tablet Oral QHS   pantoprazole  40 mg Oral BID   thiamine  100 mg Oral Daily    Objective: Vital signs in last 24 hours: Patient Vitals for the past 24 hrs:  BP Temp Temp src Pulse Resp SpO2 Weight  02/21/24 1424 (!) 89/59 97.6 F (36.4 C) Oral 74 18 100 % --  02/21/24 0838 (!) 89/50 (!) 97.4 F (36.3 C) Oral 95 20 100 % --  02/21/24 0441 -- -- -- -- -- -- 67.8 kg  02/21/24 0437 99/77 98.4 F (36.9 C) Oral 97 19 100 % --  02/20/24 2030 (!) 90/59 98.2 F (36.8 C) Oral 80 18 100 % --  Awake and alert Hss1s2 Lungs clear to auscultation Abdomen soft Skin- hyperpigmentation, dryness, scaling has improved significantly. CNS grossly non focal  Rt hip movt painful  Lab Results    Latest Ref Rng & Units 02/20/2024    5:57 AM 02/15/2024    5:24 AM 02/14/2024    3:25 PM  CBC  WBC 4.0 - 10.5 K/uL 4.9  4.6  4.1   Hemoglobin 12.0 - 15.0 g/dL 91.4  9.0  8.9   Hematocrit 36.0 - 46.0 % 31.4  27.6  27.8   Platelets 150 - 400 K/uL 254  220  245         Latest Ref Rng & Units 02/20/2024    5:57 AM 02/12/2024    5:09 AM 02/05/2024    5:52 AM  CMP  Glucose 70 - 99 mg/dL 96  85  86   BUN 6 - 20 mg/dL 12  20  32   Creatinine 0.44 - 1.00 mg/dL 7.82  9.56  2.13   Sodium 135 - 145 mmol/L 136  136  137   Potassium 3.5 - 5.1 mmol/L 3.6  3.9  4.3   Chloride 98 - 111 mmol/L 108  105  105   CO2 22 - 32 mmol/L 20  24  24    Calcium 8.9 - 10.3 mg/dL 9.3  9.5  9.7       Microbiology: 12/28 BC NG  Toxo IgG > 400 Toxo PCR neg Toxo igM neg RPR NR CMV DNA neg Crypto neg HIV RNA 2 million>> 34, 000 Cd4 is 14 ( 2.4%) repeat on 1/29 is 416 ( 23%) Beta D glucan > 500 (  repeated) Histoplasma neg Fungal antibodies negative Genosure prime- no resistant mutations QuantiFERON gold indeterminate.  No treatment needed now.  Can repeat it later.     Assessment/Plan: Encephalopathy secondary to cocaine use as resolved  Aspiration leading to acute hypoxic respiratory failure with right middle lobe and lower lobe consolidation.  Status post intubation and was mechanically ventilated in the early part of the admission.  Now has been extubated for many days Took treatment with Zosyn completed treatment repeat chest x-ray is clear      Rt sided  flank, chest pain-intermittent-- has resolved  CT abd ok- MRI lumbar spine deg changes- no acute findings other than degeneration  Rt hip  and leg pain- may do MRI pelvis/hip to r/o avascualr necrosis because of  her comorbidity and life style at risk for avascular necrosis  Rash- resolved likely was due to bactrim- changed to Atovaquone- Mepron   H/o fall with recurrent ED presentation-      Pruritus with excoriations and hyperpigmentiaon Much better ? Bactrim related rash on basleine dry and excoriated skin    AIDS- non compliant with meds or visits to her Provider- not been in care since 2021- used to be followed at [facility] before Was on Biktarvy at one time and was non compliant then VL now is 2  million and cd4 is 11  Started Biktarvy on 12/14/23 . not sure how much compliance to be expected as her social situation is not conducive and so is her substance use- watch closely for Immune reconstitution inflammatory syndrome Genotype no resistance to NRTI, NNRTI, Integrase inhibitor and PI Repeat Vl shows a significant drop in 3 weeks from 2 million to 34,000 . Latest vl is 460 and CD4 is improved significantly from 11 to 413   RISK for IRIS- watching  closely No evidence of IRIS   Toxo IgG very high-  MRI brain no CNS lesions  . TOXO PCR negative On  PCP/TOXO  and MAI prophylaxis. Was On bactrim for PCP and Toxo prophylaxis- because of new rash concerning for bactrim allergy was changed to atovaquone Rash resolved  Beta D glucan high > 500 (fungal antibodies negative Just observe.    Active cocaine use  ETOH abuse  Failure to thrive- combination of drug use and AIDS Anemia   leucopenia/thrombocytopenia could be due to AIDS ETOH abuse   resolved  CMV DNA neg , AFB blood culture sent-     She likely has  HIV associated neurocognitive disorder compounded by substance use with no insight in her medical condition     Patient waiting  to be approved for group home - if  released to shelter or any unsafe place high risk for non compliance with HAART and resorting to cocaine use VL is so much better ( 2 million to 34K) and cd4 23->400 in 1 month She has no capacity , and waiting for placement- DSS involved   On discharge she will have to go on Biktarvy ( 50-200-25 mg) and azithromycin 1200mg  weekly for MAI prophylaxis and Atovaquone for PCP and toxo prophylaxis  Discussed the management with patient .

## 2024-02-21 NOTE — Plan of Care (Signed)
  Problem: Coping: Goal: Ability to adjust to condition or change in health will improve Outcome: Progressing   Problem: Fluid Volume: Goal: Ability to maintain a balanced intake and output will improve Outcome: Progressing   Problem: Health Behavior/Discharge Planning: Goal: Ability to identify and utilize available resources and services will improve Outcome: Progressing Goal: Ability to manage health-related needs will improve Outcome: Progressing   Problem: Metabolic: Goal: Ability to maintain appropriate glucose levels will improve Outcome: Progressing   Problem: Nutritional: Goal: Maintenance of adequate nutrition will improve Outcome: Progressing Goal: Progress toward achieving an optimal weight will improve Outcome: Progressing   Problem: Skin Integrity: Goal: Risk for impaired skin integrity will decrease Outcome: Progressing   Problem: Tissue Perfusion: Goal: Adequacy of tissue perfusion will improve Outcome: Progressing   Problem: Education: Goal: Knowledge of General Education information will improve Description: Including pain rating scale, medication(s)/side effects and non-pharmacologic comfort measures Outcome: Progressing   Problem: Health Behavior/Discharge Planning: Goal: Ability to manage health-related needs will improve Outcome: Progressing   Problem: Clinical Measurements: Goal: Ability to maintain clinical measurements within normal limits will improve Outcome: Progressing Goal: Will remain free from infection Outcome: Progressing Goal: Diagnostic test results will improve Outcome: Progressing Goal: Respiratory complications will improve Outcome: Progressing Goal: Cardiovascular complication will be avoided Outcome: Progressing   Problem: Activity: Goal: Risk for activity intolerance will decrease Outcome: Progressing   Problem: Nutrition: Goal: Adequate nutrition will be maintained Outcome: Progressing   Problem: Coping: Goal:  Level of anxiety will decrease Outcome: Progressing   Problem: Elimination: Goal: Will not experience complications related to bowel motility Outcome: Progressing Goal: Will not experience complications related to urinary retention Outcome: Progressing   Problem: Pain Management: Goal: General experience of comfort will improve Outcome: Progressing   Problem: Safety: Goal: Ability to remain free from injury will improve Outcome: Progressing   Problem: Skin Integrity: Goal: Risk for impaired skin integrity will decrease Outcome: Progressing   Problem: Activity: Goal: Ability to tolerate increased activity will improve Outcome: Progressing   Problem: Respiratory: Goal: Ability to maintain a clear airway and adequate ventilation will improve Outcome: Progressing

## 2024-02-22 DIAGNOSIS — F02818 Dementia in other diseases classified elsewhere, unspecified severity, with other behavioral disturbance: Secondary | ICD-10-CM | POA: Diagnosis not present

## 2024-02-22 DIAGNOSIS — B20 Human immunodeficiency virus [HIV] disease: Secondary | ICD-10-CM | POA: Diagnosis not present

## 2024-02-22 NOTE — Progress Notes (Signed)
 Progress Note   Patient: Meghan Jarvis ZOX:[id number] DOB: May 31, 1976 DOA: 12/08/2023     76 DOS: the patient was seen and examined on 02/22/2024   Hospital course / significant events:  47yo with h/o polysubstance (ETOH, cocaine) abuse, afib, homelessness, AIDS, hypothyroidism, and DM who presented on 12/08/23 with acute respiratory failure.  She was found down by a friend and could not wake her up.  She was found to be hypoxic in the 70s. Admitted to ICU, intubated, started on antibiotics for pneumonia.  12/31: extubated. ID consulted.  MRI brain with advanced cerebral atrophy.  Psych evaluated - "Patient is lacking and reasoning in her medical care for example she is unable to describe the consequence that she will have if she does not take her medications or has complications with her chronic conditions" and needs assistance with housing and finances due to cognitive decline.  APS/DSS pending guardianship and will need placement.     Consultants:  infectious disease Psychiatry   Procedures/Surgeries: none     ASSESSMENT & PLAN:   Note: Patient is medically stable and awaiting DSS guardianship for placement.     HIV Associated Neurocognitive Disorder Mental status improved from admission but patient is a poor historian and does not have capacity to make decisions at this time per psychiatry DSS referral by [facility] MRI did not show any enhancing lesions but did show severe cerebral atrophy - likely HIV associated neurocognitive disorder DSS working on guardianship Patient should not be discharged prior to DSS approval per hospital leadership The patient is currently unsure what her discharge plan will be (generally names different family members daily, but none today)   Pain Has complained of back, knee, hip, and abdominal pain during hospitalization Exam nonspecific Neg XR L hip, lumbar spine, R knee, CT A/P, MR lumbar and thoracic spine, L hand/wrist, R elbow, L shoulder, CT C  spine PT/OT consulted and suggest home PT vs. 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  Viral load 2 million, CD4 14 on admission Infectious disease following, started Biktarvy Continue on PCP and MAI prophylaxis with atovaquone and Zithromax Toxoplasma antibody IgG came back elevated but MRI of the brain does not show any enhancing lesions Repeat viral load down to 34,300 Infectious disease on board    Pancytopenia  Secondary to HIV Platelet count and WBC have normalized Hgb is stable at last check Monitor CBC periodically   Cocaine abuse  Patient has been counseled on cessation of cocaine use   Alcohol abuse Continue thiamine, multivitamin, and folic acid     DVT prophylaxis: lovenox     Code Status: FULL CODE     TOC needs: Follow-up with DSS   Barriers to dispo / significant pending items: Guardianship     Subjective / Brief ROS:  Patient seen in the presence of the boyfriend Denies nausea vomiting abdominal pain   Family Communication: none at this time      Physical Exam Constitutional:      General: She is not in acute distress. Pulmonary:     Effort: Pulmonary effort is normal.  Neurological:     Mental Status: She is alert. Mental status is at baseline.  Psychiatric:        Behavior: Behavior normal.  Data reviewed:  Vitals:   02/22/24 0434 02/22/24 0436 02/22/24 0739 02/22/24 1527  BP: 98/78  100/69 95/64  Pulse: 88  81 76  Resp: 20  16 20   Temp: 98.1 F (36.7 C)  97.8 F (36.6 C) 98.4 F (36.9 C)  TempSrc: Oral     SpO2: 100%  100% 100%  Weight:  66.3 kg    Height:          Latest Ref Rng & Units 02/20/2024    5:57 AM 02/12/2024    5:09 AM 02/05/2024    5:52 AM  BMP  Glucose 70 - 99 mg/dL 96  85  86   BUN 6  - 20 mg/dL 12  20  32   Creatinine 0.44 - 1.00 mg/dL 8.29  5.62  1.30   Sodium 135 - 145 mmol/L 136  136  137   Potassium 3.5 - 5.1 mmol/L 3.6  3.9  4.3   Chloride 98 - 111 mmol/L 108  105  105   CO2 22 - 32 mmol/L 20  24  24    Calcium 8.9 - 10.3 mg/dL 9.3  9.5  9.7      Author: Loyce Dys, MD 02/22/2024 4:43 PM  For on call review [email or web address].

## 2024-02-22 NOTE — Plan of Care (Signed)
   Problem: Fluid Volume: Goal: Ability to maintain a balanced intake and output will improve Outcome: Progressing   Problem: Nutritional: Goal: Maintenance of adequate nutrition will improve Outcome: Progressing

## 2024-02-23 DIAGNOSIS — B20 Human immunodeficiency virus [HIV] disease: Secondary | ICD-10-CM | POA: Diagnosis not present

## 2024-02-23 DIAGNOSIS — F02818 Dementia in other diseases classified elsewhere, unspecified severity, with other behavioral disturbance: Secondary | ICD-10-CM | POA: Diagnosis not present

## 2024-02-23 NOTE — Progress Notes (Signed)
 Progress Note   Patient: Meghan Jarvis:[id number] DOB: 1976/10/07 DOA: 12/08/2023     77 DOS: the patient was seen and examined on 02/23/2024    Hospital course / significant events:  47yo with h/o polysubstance (ETOH, cocaine) abuse, afib, homelessness, AIDS, hypothyroidism, and DM who presented on 12/08/23 with acute respiratory failure.  She was found down by a friend and could not wake her up.  She was found to be hypoxic in the 70s. Admitted to ICU, intubated, started on antibiotics for pneumonia.  12/31: extubated. ID consulted.  MRI brain with advanced cerebral atrophy.  Psych evaluated - "Patient is lacking and reasoning in her medical care for example she is unable to describe the consequence that she will have if she does not take her medications or has complications with her chronic conditions" and needs assistance with housing and finances due to cognitive decline.  APS/DSS pending guardianship and will need placement.     Consultants:  infectious disease Psychiatry   Procedures/Surgeries: none     ASSESSMENT & PLAN:   Note: Patient is medically stable and awaiting DSS guardianship for placement.     HIV Associated Neurocognitive Disorder Mental status improved from admission but patient is a poor historian and does not have capacity to make decisions at this time per psychiatry DSS referral by [facility] MRI did not show any enhancing lesions but did show severe cerebral atrophy - likely HIV associated neurocognitive disorder Department of social services working on guardianship Patient should not be discharged prior to DSS approval per hospital leadership The patient is currently unsure what her discharge plan will be (generally names different family members daily, but none today) Plan of care discussed with infectious disease   Pain Has complained of back, knee, hip, and abdominal pain during hospitalization Exam nonspecific Neg XR L hip, lumbar spine, R knee, CT  A/P, MR lumbar and thoracic spine, L hand/wrist, R elbow, L shoulder, CT C spine PT/OT consulted and suggest home PT vs. 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  Viral load 2 million, CD4 14 on admission Infectious disease following, started Biktarvy Continue on PCP and MAI prophylaxis with atovaquone and Zithromax Toxoplasma antibody IgG came back elevated but MRI of the brain does not show any enhancing lesions Repeat viral load down to 34,300 Infectious disease on board    Pancytopenia  Secondary to HIV Platelet count and WBC have normalized Hgb is stable at last check Monitor CBC periodically   Cocaine abuse  Patient has been counseled on cessation of cocaine use   Alcohol abuse Continue thiamine, multivitamin, and folic acid     DVT prophylaxis: lovenox     Code Status: FULL CODE     TOC needs: Follow-up with DSS   Barriers to dispo / significant pending items: Guardianship     Subjective / Brief ROS:  Patient seen in the presence of the boyfriend Denies nausea vomiting abdominal pain chest pain   Family Communication: none at this time      Physical Exam Constitutional:      General: She is not in acute distress. Pulmonary:     Effort: Pulmonary effort is normal.  Neurological:     Mental Status: She is alert. Mental status is at baseline.  Psychiatric:        Behavior: Behavior normal.  Data reviewed:      Latest Ref Rng & Units 02/20/2024    5:57 AM 02/15/2024    5:24 AM 02/14/2024    3:25 PM  CBC  WBC 4.0 - 10.5 K/uL 4.9  4.6  4.1   Hemoglobin 12.0 - 15.0 g/dL 24.4  9.0  8.9   Hematocrit 36.0 - 46.0 % 31.4  27.6  27.8   Platelets 150 - 400 K/uL 254  220  245        Latest Ref Rng & Units 02/20/2024    5:57 AM 02/12/2024    5:09 AM 02/05/2024    5:52 AM  BMP  Glucose 70 - 99  mg/dL 96  85  86   BUN 6 - 20 mg/dL 12  20  32   Creatinine 0.44 - 1.00 mg/dL 0.10  2.72  5.36   Sodium 135 - 145 mmol/L 136  136  137   Potassium 3.5 - 5.1 mmol/L 3.6  3.9  4.3   Chloride 98 - 111 mmol/L 108  105  105   CO2 22 - 32 mmol/L 20  24  24    Calcium 8.9 - 10.3 mg/dL 9.3  9.5  9.7       Vitals:   02/22/24 2105 02/23/24 0308 02/23/24 0315 02/23/24 0827  BP: 96/69 94/70  102/70  Pulse: 70 88  99  Resp: 20 16  16   Temp: 97.9 F (36.6 C) 98.7 F (37.1 C)  98.2 F (36.8 C)  TempSrc: Oral     SpO2: 100% 100%  100%  Weight:   67.4 kg   Height:         Time spent: 38 minutes  Author: Loyce Dys, MD 02/23/2024 2:14 PM  For on call review [email or web address].

## 2024-02-24 DIAGNOSIS — B20 Human immunodeficiency virus [HIV] disease: Secondary | ICD-10-CM | POA: Diagnosis not present

## 2024-02-24 DIAGNOSIS — F02818 Dementia in other diseases classified elsewhere, unspecified severity, with other behavioral disturbance: Secondary | ICD-10-CM | POA: Diagnosis not present

## 2024-02-24 NOTE — Progress Notes (Signed)
 Progress Note   Patient: Meghan Jarvis QMV:[id number] DOB: [birth year] DOA: 12/08/2023     78 DOS: the patient was seen and examined on 02/24/2024     Hospital course / significant events:  47yo with h/o polysubstance (ETOH, cocaine) abuse, afib, homelessness, AIDS, hypothyroidism, and DM who presented on 12/08/23 with acute respiratory failure.  She was found down by a friend and could not wake her up.  She was found to be hypoxic in the 70s. Admitted to ICU, intubated, started on antibiotics for pneumonia.  12/31: extubated. ID consulted.  MRI brain with advanced cerebral atrophy.  Psych evaluated - "Patient is lacking and reasoning in her medical care for example she is unable to describe the consequence that she will have if she does not take her medications or has complications with her chronic conditions" and needs assistance with housing and finances due to cognitive decline.  APS/DSS pending guardianship and will need placement.     Consultants:  infectious disease Psychiatry   Procedures/Surgeries: none     ASSESSMENT & PLAN:   Note: Patient is medically stable and awaiting DSS guardianship for placement.     HIV Associated Neurocognitive Disorder Mental status improved from admission but patient is a poor historian and does not have capacity to make decisions at this time per psychiatry DSS referral by [facility] MRI did not show any enhancing lesions but did show severe cerebral atrophy - likely HIV associated neurocognitive disorder Department of social services working on guardianship Patient should not be discharged prior to DSS approval per hospital leadership The patient is currently unsure what her discharge plan will be (generally names different family members daily, but none today) Plan of care discussed with infectious disease   Pain Has complained of back, knee, hip, and abdominal pain during hospitalization Exam nonspecific Neg XR L hip, lumbar spine, R knee, CT  A/P, MR lumbar and thoracic spine, L hand/wrist, R elbow, L shoulder, CT C spine PT/OT consulted and suggest home PT vs. 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  Viral load 2 million, CD4 14 on admission Infectious disease following, started Biktarvy Continue on PCP and MAI prophylaxis with atovaquone and Zithromax Toxoplasma antibody IgG came back elevated but MRI of the brain does not show any enhancing lesions Repeat viral load down to 34,300 Infectious disease on board    Pancytopenia  Secondary to HIV Platelet count and WBC have normalized Hgb is stable at last check Monitor CBC periodically   Cocaine abuse  Patient has been counseled on cessation of cocaine use   Alcohol abuse Continue thiamine, multivitamin, and folic acid     DVT prophylaxis: lovenox     Code Status: FULL CODE     TOC needs: Follow-up with DSS   Barriers to dispo / significant pending items: Guardianship     Subjective / Brief ROS:  Patient seen and examined in the presence of the boyfriend Denies nausea vomiting abdominal pain chest pain or cough   Family Communication: none at this time      Physical Exam Constitutional:      General: She is not in acute distress. Pulmonary:     Effort: Pulmonary effort is normal.  Neurological:     Mental Status: She is alert. Mental status is at baseline.  Psychiatric:  Behavior: Behavior normal.        Data reviewed:   Vitals:   02/24/24 0425 02/24/24 0440 02/24/24 0929 02/24/24 1355  BP: 96/69  94/72 91/68  Pulse:   93 86  Resp: 18  18 20   Temp: 99.1 F (37.3 C)  98.4 F (36.9 C) 98.3 F (36.8 C)  TempSrc: Oral  Oral   SpO2: 100%  100% 100%  Weight:  69 kg    Height:          Latest Ref Rng & Units 02/20/2024    5:57 AM 02/15/2024    5:24 AM 02/14/2024    3:25 PM   CBC  WBC 4.0 - 10.5 K/uL 4.9  4.6  4.1   Hemoglobin 12.0 - 15.0 g/dL 09.8  9.0  8.9   Hematocrit 36.0 - 46.0 % 31.4  27.6  27.8   Platelets 150 - 400 K/uL 254  220  245        Latest Ref Rng & Units 02/20/2024    5:57 AM 02/12/2024    5:09 AM 02/05/2024    5:52 AM  BMP  Glucose 70 - 99 mg/dL 96  85  86   BUN 6 - 20 mg/dL 12  20  32   Creatinine 0.44 - 1.00 mg/dL 1.19  1.47  8.29   Sodium 135 - 145 mmol/L 136  136  137   Potassium 3.5 - 5.1 mmol/L 3.6  3.9  4.3   Chloride 98 - 111 mmol/L 108  105  105   CO2 22 - 32 mmol/L 20  24  24    Calcium 8.9 - 10.3 mg/dL 9.3  9.5  9.7      Author: Loyce Dys, MD 02/24/2024 2:02 PM  For on call review [email or web address].

## 2024-02-24 NOTE — Plan of Care (Signed)
  Problem: Coping: Goal: Ability to adjust to condition or change in health will improve Outcome: Progressing   Problem: Fluid Volume: Goal: Ability to maintain a balanced intake and output will improve Outcome: Progressing   Problem: Health Behavior/Discharge Planning: Goal: Ability to identify and utilize available resources and services will improve Outcome: Progressing Goal: Ability to manage health-related needs will improve Outcome: Progressing   Problem: Metabolic: Goal: Ability to maintain appropriate glucose levels will improve Outcome: Progressing   Problem: Nutritional: Goal: Maintenance of adequate nutrition will improve Outcome: Progressing Goal: Progress toward achieving an optimal weight will improve Outcome: Progressing   Problem: Skin Integrity: Goal: Risk for impaired skin integrity will decrease Outcome: Progressing   Problem: Tissue Perfusion: Goal: Adequacy of tissue perfusion will improve Outcome: Progressing   Problem: Education: Goal: Knowledge of General Education information will improve Description: Including pain rating scale, medication(s)/side effects and non-pharmacologic comfort measures Outcome: Progressing   Problem: Health Behavior/Discharge Planning: Goal: Ability to manage health-related needs will improve Outcome: Progressing   Problem: Clinical Measurements: Goal: Ability to maintain clinical measurements within normal limits will improve Outcome: Progressing Goal: Will remain free from infection Outcome: Progressing Goal: Diagnostic test results will improve Outcome: Progressing Goal: Respiratory complications will improve Outcome: Progressing Goal: Cardiovascular complication will be avoided Outcome: Progressing   Problem: Activity: Goal: Risk for activity intolerance will decrease Outcome: Progressing   Problem: Nutrition: Goal: Adequate nutrition will be maintained Outcome: Progressing   Problem: Coping: Goal:  Level of anxiety will decrease Outcome: Progressing   Problem: Elimination: Goal: Will not experience complications related to bowel motility Outcome: Progressing Goal: Will not experience complications related to urinary retention Outcome: Progressing   Problem: Pain Management: Goal: General experience of comfort will improve Outcome: Progressing   Problem: Safety: Goal: Ability to remain free from injury will improve Outcome: Progressing   Problem: Skin Integrity: Goal: Risk for impaired skin integrity will decrease Outcome: Progressing   Problem: Activity: Goal: Ability to tolerate increased activity will improve Outcome: Progressing   Problem: Respiratory: Goal: Ability to maintain a clear airway and adequate ventilation will improve Outcome: Progressing

## 2024-02-25 ENCOUNTER — Inpatient Hospital Stay: Payer: MEDICAID

## 2024-02-25 DIAGNOSIS — F02818 Dementia in other diseases classified elsewhere, unspecified severity, with other behavioral disturbance: Secondary | ICD-10-CM | POA: Diagnosis not present

## 2024-02-25 DIAGNOSIS — B20 Human immunodeficiency virus [HIV] disease: Secondary | ICD-10-CM | POA: Diagnosis not present

## 2024-02-25 MED ORDER — GADOBUTROL 1 MMOL/ML IV SOLN
6.0000 mL | Freq: Once | INTRAVENOUS | Status: AC | PRN
  Administered 2024-02-25: 6 mL via INTRAVENOUS

## 2024-02-25 NOTE — Plan of Care (Signed)
  Problem: Coping: Goal: Ability to adjust to condition or change in health will improve Outcome: Progressing   Problem: Fluid Volume: Goal: Ability to maintain a balanced intake and output will improve Outcome: Progressing   Problem: Health Behavior/Discharge Planning: Goal: Ability to identify and utilize available resources and services will improve Outcome: Progressing Goal: Ability to manage health-related needs will improve Outcome: Progressing   Problem: Metabolic: Goal: Ability to maintain appropriate glucose levels will improve Outcome: Progressing   Problem: Nutritional: Goal: Maintenance of adequate nutrition will improve Outcome: Progressing Goal: Progress toward achieving an optimal weight will improve Outcome: Progressing   Problem: Skin Integrity: Goal: Risk for impaired skin integrity will decrease Outcome: Progressing   Problem: Tissue Perfusion: Goal: Adequacy of tissue perfusion will improve Outcome: Progressing   Problem: Education: Goal: Knowledge of General Education information will improve Description: Including pain rating scale, medication(s)/side effects and non-pharmacologic comfort measures Outcome: Progressing   Problem: Health Behavior/Discharge Planning: Goal: Ability to manage health-related needs will improve Outcome: Progressing   Problem: Clinical Measurements: Goal: Ability to maintain clinical measurements within normal limits will improve Outcome: Progressing Goal: Will remain free from infection Outcome: Progressing Goal: Diagnostic test results will improve Outcome: Progressing Goal: Respiratory complications will improve Outcome: Progressing Goal: Cardiovascular complication will be avoided Outcome: Progressing   Problem: Activity: Goal: Risk for activity intolerance will decrease Outcome: Progressing   Problem: Nutrition: Goal: Adequate nutrition will be maintained Outcome: Progressing   Problem: Coping: Goal:  Level of anxiety will decrease Outcome: Progressing   Problem: Elimination: Goal: Will not experience complications related to bowel motility Outcome: Progressing Goal: Will not experience complications related to urinary retention Outcome: Progressing   Problem: Pain Management: Goal: General experience of comfort will improve Outcome: Progressing   Problem: Safety: Goal: Ability to remain free from injury will improve Outcome: Progressing   Problem: Skin Integrity: Goal: Risk for impaired skin integrity will decrease Outcome: Progressing   Problem: Activity: Goal: Ability to tolerate increased activity will improve Outcome: Progressing   Problem: Respiratory: Goal: Ability to maintain a clear airway and adequate ventilation will improve Outcome: Progressing

## 2024-02-25 NOTE — Plan of Care (Signed)
  Problem: Coping: Goal: Ability to adjust to condition or change in health will improve Outcome: Progressing   Problem: Coping: Goal: Ability to adjust to condition or change in health will improve Outcome: Progressing   Problem: Fluid Volume: Goal: Ability to maintain a balanced intake and output will improve Outcome: Progressing   Problem: Metabolic: Goal: Ability to maintain appropriate glucose levels will improve Outcome: Progressing   Problem: Nutritional: Goal: Maintenance of adequate nutrition will improve Outcome: Progressing Goal: Progress toward achieving an optimal weight will improve Outcome: Progressing   Problem: Skin Integrity: Goal: Risk for impaired skin integrity will decrease Outcome: Progressing   Problem: Education: Goal: Knowledge of General Education information will improve Description: Including pain rating scale, medication(s)/side effects and non-pharmacologic comfort measures Outcome: Progressing   Problem: Health Behavior/Discharge Planning: Goal: Ability to manage health-related needs will improve Outcome: Progressing   Problem: Clinical Measurements: Goal: Ability to maintain clinical measurements within normal limits will improve Outcome: Progressing Goal: Will remain free from infection Outcome: Progressing Goal: Diagnostic test results will improve Outcome: Progressing Goal: Respiratory complications will improve Outcome: Progressing Goal: Cardiovascular complication will be avoided Outcome: Progressing   Problem: Activity: Goal: Risk for activity intolerance will decrease Outcome: Progressing   Problem: Nutrition: Goal: Adequate nutrition will be maintained Outcome: Progressing   Problem: Coping: Goal: Level of anxiety will decrease Outcome: Progressing   Problem: Respiratory: Goal: Ability to maintain a clear airway and adequate ventilation will improve Outcome: Progressing   Problem: Skin Integrity: Goal: Risk for  impaired skin integrity will decrease Outcome: Progressing

## 2024-02-25 NOTE — Progress Notes (Signed)
 Progress Note   Patient: Meghan Jarvis:[id number] DOB: 1976/07/28 DOA: 12/08/2023     79 DOS: the patient was seen and examined on 02/25/2024     Hospital course / significant events:  47yo with h/o polysubstance (ETOH, cocaine) abuse, afib, homelessness, AIDS, hypothyroidism, and DM who presented on 12/08/23 with acute respiratory failure.  She was found down by a friend and could not wake her up.  She was found to be hypoxic in the 70s. Admitted to ICU, intubated, started on antibiotics for pneumonia.  12/31: extubated. ID consulted.  MRI brain with advanced cerebral atrophy.  Psych evaluated - "Patient is lacking and reasoning in her medical care for example she is unable to describe the consequence that she will have if she does not take her medications or has complications with her chronic conditions" and needs assistance with housing and finances due to cognitive decline.  APS/DSS pending guardianship and will need placement.     Consultants:  infectious disease Psychiatry   Procedures/Surgeries: none     ASSESSMENT & PLAN:   Note: Patient is medically stable and awaiting DSS guardianship for placement.     HIV Associated Neurocognitive Disorder Mental status improved from admission but patient is a poor historian and does not have capacity to make decisions at this time per psychiatry DSS referral by [facility] MRI did not show any enhancing lesions but did show severe cerebral atrophy - likely HIV associated neurocognitive disorder Department of social services working on guardianship Patient should not be discharged prior to DSS approval per hospital leadership The patient is currently unsure what her discharge plan will be (generally names different family members daily, but none today) Plan of care discussed with infectious disease   Pain Has complained of back, knee, hip, and abdominal pain during hospitalization Exam nonspecific Neg XR L hip, lumbar spine, R knee, CT  A/P, MR lumbar and thoracic spine, L hand/wrist, R elbow, L shoulder, CT C spine PT/OT consulted and suggest home PT vs. 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  Viral load 2 million, CD4 14 on admission Infectious disease following, started Biktarvy Continue on PCP and MAI prophylaxis with atovaquone and Zithromax Toxoplasma antibody IgG came back elevated but MRI of the brain does not show any enhancing lesions Repeat viral load down to 34,300 I have discussed with infectious disease today   Pancytopenia  Secondary to HIV Platelet count and WBC have normalized Hgb is stable at last check Monitor CBC periodically   Cocaine abuse  Patient has been counseled on cessation of cocaine use   Alcohol abuse Continue thiamine, multivitamin, and folic acid     DVT prophylaxis: lovenox     Code Status: FULL CODE     TOC needs: Follow-up with DSS   Barriers to dispo / significant pending items: Guardianship     Subjective / Brief ROS:  Patient seen and examined in the presence of the boyfriend She did complain of right hip pain MRI of the hip requested   Family Communication: none at this time      Physical Exam Constitutional:      General: She is not in acute distress. Pulmonary:     Effort:  Pulmonary effort is normal.  Neurological:     Mental Status: She is alert. Mental status is at baseline.  Psychiatric:        Behavior: Behavior normal.        Data reviewed:        Latest Ref Rng & Units 02/20/2024    5:57 AM 02/15/2024    5:24 AM 02/14/2024    3:25 PM  CBC  WBC 4.0 - 10.5 K/uL 4.9  4.6  4.1   Hemoglobin 12.0 - 15.0 g/dL 16.1  9.0  8.9   Hematocrit 36.0 - 46.0 % 31.4   27.6  27.8   Platelets 150 - 400 K/uL 254  220  245      Vitals:   02/24/24 0929 02/24/24 1355 02/24/24 1922 02/25/24 0755  BP: 94/72 91/68 103/74 93/64  Pulse: 93 86 79 86  Resp: 18 20 18 18   Temp: 98.4 F (36.9 C) 98.3 F (36.8 C) 98.1 F (36.7 C) 98.5 F (36.9 C)  TempSrc: Oral  Oral Oral  SpO2: 100% 100% 100% 100%  Weight:    67.1 kg  Height:         Author: Loyce Dys, MD 02/25/2024 12:42 PM  For on call review [email or web address].

## 2024-02-26 DIAGNOSIS — B20 Human immunodeficiency virus [HIV] disease: Secondary | ICD-10-CM | POA: Diagnosis not present

## 2024-02-26 DIAGNOSIS — F02818 Dementia in other diseases classified elsewhere, unspecified severity, with other behavioral disturbance: Secondary | ICD-10-CM | POA: Diagnosis not present

## 2024-02-26 NOTE — Progress Notes (Signed)
 Progress Note   Patient: Meghan Jarvis WUJ:[id number] DOB: August 05, 1976 DOA: 12/08/2023     80 DOS: the patient was seen and examined on 02/26/2024    Hospital course / significant events:  47yo with h/o polysubstance (ETOH, cocaine) abuse, afib, homelessness, AIDS, hypothyroidism, and DM who presented on 12/08/23 with acute respiratory failure.  She was found down by a friend and could not wake her up.  She was found to be hypoxic in the 70s. Admitted to ICU, intubated, started on antibiotics for pneumonia.  12/31: extubated. ID consulted.  MRI brain with advanced cerebral atrophy.  Psych evaluated - "Patient is lacking and reasoning in her medical care for example she is unable to describe the consequence that she will have if she does not take her medications or has complications with her chronic conditions" and needs assistance with housing and finances due to cognitive decline.  APS/DSS pending guardianship and will need placement.     Consultants:  infectious disease Psychiatry   Procedures/Surgeries: none     ASSESSMENT & PLAN:   Note: Patient is medically stable and awaiting DSS guardianship for placement.     HIV Associated Neurocognitive Disorder Mental status improved from admission but patient is a poor historian and does not have capacity to make decisions at this time per psychiatry DSS referral by [facility] MRI did not show any enhancing lesions but did show severe cerebral atrophy - likely HIV associated neurocognitive disorder Department of social services working on guardianship Patient should not be discharged prior to DSS approval per hospital leadership The patient is currently unsure what her discharge plan will be (generally names different family members daily, but none today) Plan of care discussed with infectious disease   Pain Has complained of back, knee, hip, and abdominal pain during hospitalization Exam nonspecific Neg XR L hip, lumbar spine, R knee, CT  A/P, MR lumbar and thoracic spine, L hand/wrist, R elbow, L shoulder, CT C spine PT/OT consulted and suggest home PT vs. 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  Viral load 2 million, CD4 14 on admission Infectious disease following, started Biktarvy Continue on PCP and MAI prophylaxis with atovaquone and Zithromax Toxoplasma antibody IgG came back elevated but MRI of the brain does not show any enhancing lesions Repeat viral load down to 34,300 I have discussed with infectious disease today   Pancytopenia  Secondary to HIV Platelet count and WBC have normalized Hgb is stable at last check Monitor CBC periodically   Cocaine abuse  Patient has been counseled on cessation of cocaine use   Alcohol abuse Continue thiamine, multivitamin, and folic acid     DVT prophylaxis: lovenox     Code Status: FULL CODE     TOC needs: Follow-up with DSS   Barriers to dispo / significant pending items: Guardianship   Subjective  Patient seen and examined at bedside this morning Denies nausea vomiting abdominal pain chest pain cough   Family Communication: none at this time      Physical Exam Constitutional:      General: She is not in acute distress. Pulmonary:     Effort:  Pulmonary effort is normal.  Neurological:     Mental Status: She is alert. Mental status is at baseline.  Psychiatric:        Behavior: Behavior normal.        Data reviewed:     Latest Ref Rng & Units 02/20/2024    5:57 AM 02/15/2024    5:24 AM 02/14/2024    3:25 PM  CBC  WBC 4.0 - 10.5 K/uL 4.9  4.6  4.1   Hemoglobin 12.0 - 15.0 g/dL 98.1  9.0  8.9    Hematocrit 36.0 - 46.0 % 31.4  27.6  27.8   Platelets 150 - 400 K/uL 254  220  245     Vitals:   02/26/24 0417 02/26/24 0500 02/26/24 0804 02/26/24 1627  BP: 100/78  98/74 92/68  Pulse: 81   86  Resp: 20  18 16   Temp: 98.4 F (36.9 C)  98.2 F (36.8 C) 98.6 F (37 C)  TempSrc: Oral  Oral Oral  SpO2: 98%  96% 100%  Weight:  60.7 kg    Height:         Author: Loyce Dys, MD 02/26/2024 5:02 PM  For on call review [email or web address].

## 2024-02-26 NOTE — TOC Progression Note (Signed)
 Transition of Care [facility]) - Progression Note    Patient Details  Name: Meghan Jarvis MRN: [id number] Date of Birth: 10-27-1976  [facility] CM/SW Contact  Chapman Fitch, RN Phone Number: 02/26/2024, 3:41 PM  Clinical Narrative:    Dss guardianship pending        Expected Discharge Plan and Services         Expected Discharge Date: 01/10/24                                     Social Determinants of Health (SDOH) Interventions SDOH Screenings   Food Insecurity: Patient Unable To Answer (12/09/2023)  Recent Concern: Food Insecurity - Food Insecurity Present (09/26/2023)  Housing: Patient Unable To Answer (12/09/2023)  Recent Concern: Housing - Medium Risk (09/26/2023)  Transportation Needs: Patient Unable To Answer (12/09/2023)  Recent Concern: Transportation Needs - Unmet Transportation Needs (10/11/2023)  Utilities: Patient Unable To Answer (12/09/2023)  Recent Concern: Utilities - At Risk (09/25/2023)  Tobacco Use: High Risk (12/08/2023)    Readmission Risk Interventions     No data to display

## 2024-02-26 NOTE — Plan of Care (Signed)
  Problem: Coping: Goal: Ability to adjust to condition or change in health will improve Outcome: Progressing   Problem: Fluid Volume: Goal: Ability to maintain a balanced intake and output will improve Outcome: Progressing   Problem: Health Behavior/Discharge Planning: Goal: Ability to identify and utilize available resources and services will improve Outcome: Progressing Goal: Ability to manage health-related needs will improve Outcome: Progressing   Problem: Metabolic: Goal: Ability to maintain appropriate glucose levels will improve Outcome: Progressing   Problem: Nutritional: Goal: Maintenance of adequate nutrition will improve Outcome: Progressing Goal: Progress toward achieving an optimal weight will improve Outcome: Progressing   Problem: Skin Integrity: Goal: Risk for impaired skin integrity will decrease Outcome: Progressing   Problem: Tissue Perfusion: Goal: Adequacy of tissue perfusion will improve Outcome: Progressing   Problem: Education: Goal: Knowledge of General Education information will improve Description: Including pain rating scale, medication(s)/side effects and non-pharmacologic comfort measures Outcome: Progressing   Problem: Health Behavior/Discharge Planning: Goal: Ability to manage health-related needs will improve Outcome: Progressing   Problem: Clinical Measurements: Goal: Ability to maintain clinical measurements within normal limits will improve Outcome: Progressing Goal: Will remain free from infection Outcome: Progressing Goal: Diagnostic test results will improve Outcome: Progressing Goal: Respiratory complications will improve Outcome: Progressing Goal: Cardiovascular complication will be avoided Outcome: Progressing   Problem: Activity: Goal: Risk for activity intolerance will decrease Outcome: Progressing   Problem: Nutrition: Goal: Adequate nutrition will be maintained Outcome: Progressing   Problem: Coping: Goal:  Level of anxiety will decrease Outcome: Progressing   Problem: Elimination: Goal: Will not experience complications related to bowel motility Outcome: Progressing Goal: Will not experience complications related to urinary retention Outcome: Progressing   Problem: Pain Management: Goal: General experience of comfort will improve Outcome: Progressing   Problem: Safety: Goal: Ability to remain free from injury will improve Outcome: Progressing   Problem: Skin Integrity: Goal: Risk for impaired skin integrity will decrease Outcome: Progressing   Problem: Activity: Goal: Ability to tolerate increased activity will improve Outcome: Progressing   Problem: Respiratory: Goal: Ability to maintain a clear airway and adequate ventilation will improve Outcome: Progressing

## 2024-02-27 ENCOUNTER — Inpatient Hospital Stay: Payer: MEDICAID

## 2024-02-27 DIAGNOSIS — R1031 Right lower quadrant pain: Secondary | ICD-10-CM | POA: Diagnosis not present

## 2024-02-27 DIAGNOSIS — F02818 Dementia in other diseases classified elsewhere, unspecified severity, with other behavioral disturbance: Secondary | ICD-10-CM | POA: Diagnosis not present

## 2024-02-27 DIAGNOSIS — B20 Human immunodeficiency virus [HIV] disease: Secondary | ICD-10-CM | POA: Diagnosis not present

## 2024-02-27 LAB — BASIC METABOLIC PANEL
Anion gap: 3 — ABNORMAL LOW (ref 5–15)
BUN: 13 mg/dL (ref 6–20)
CO2: 23 mmol/L (ref 22–32)
Calcium: 8.8 mg/dL — ABNORMAL LOW (ref 8.9–10.3)
Chloride: 113 mmol/L — ABNORMAL HIGH (ref 98–111)
Creatinine, Ser: 0.67 mg/dL (ref 0.44–1.00)
GFR, Estimated: >60 mL/min (ref >60)
Glucose, Bld: 86 mg/dL (ref 70–99)
Potassium: 3.7 mmol/L (ref 3.5–5.1)
Sodium: 139 mmol/L (ref 135–145)

## 2024-02-27 LAB — CBC WITH DIFFERENTIAL/PLATELET
Abs Immature Granulocytes: 0.01 10*3/uL (ref 0.00–0.07)
Basophils Absolute: 0 10*3/uL (ref 0.0–0.1)
Basophils Relative: 1 %
Eosinophils Absolute: 0.5 10*3/uL (ref 0.0–0.5)
Eosinophils Relative: 10 %
HCT: 29.7 % — ABNORMAL LOW (ref 36.0–46.0)
Hemoglobin: 9.6 g/dL — ABNORMAL LOW (ref 12.0–15.0)
Immature Granulocytes: 0 %
Lymphocytes Relative: 43 %
Lymphs Abs: 2.4 10*3/uL (ref 0.7–4.0)
MCH: 27.7 pg (ref 26.0–34.0)
MCHC: 32.3 g/dL (ref 30.0–36.0)
MCV: 85.6 fL (ref 80.0–100.0)
Monocytes Absolute: 0.6 10*3/uL (ref 0.1–1.0)
Monocytes Relative: 11 %
Neutro Abs: 1.9 10*3/uL (ref 1.7–7.7)
Neutrophils Relative %: 35 %
Platelets: 311 10*3/uL (ref 150–400)
RBC: 3.47 MIL/uL — ABNORMAL LOW (ref 3.87–5.11)
RDW: 14.7 % (ref 11.5–15.5)
WBC: 5.5 10*3/uL (ref 4.0–10.5)
nRBC: 0 % (ref 0.0–0.2)

## 2024-02-27 MED ORDER — IOHEXOL 300 MG/ML  SOLN
100.0000 mL | Freq: Once | INTRAMUSCULAR | Status: AC | PRN
  Administered 2024-02-27: 100 mL via INTRAVENOUS

## 2024-02-27 MED ORDER — PANCRELIPASE (LIP-PROT-AMYL) 12000-38000 UNITS PO CPEP
24000.0000 [IU] | ORAL_CAPSULE | Freq: Three times a day (TID) | ORAL | Status: DC
  Administered 2024-02-27 – 2024-05-07 (×197): 24000 [IU] via ORAL
  Filled 2024-02-27 (×194): qty 2

## 2024-02-27 NOTE — Progress Notes (Signed)
 Progress Note   Patient: Meghan Jarvis ZOX:[id number] DOB: August 14, 1976 DOA: 12/08/2023     81 DOS: the patient was seen and examined on 02/27/2024   Brief hospital course:  47yo with h/o polysubstance (ETOH, cocaine) abuse, afib, homelessness, AIDS, hypothyroidism, and DM who presented on 12/08/23 with acute respiratory failure.  She was found down by a friend and could not wake her up.  She was found to be hypoxic in the 70s. Admitted to ICU, intubated, started on antibiotics for pneumonia.  12/31: extubated. ID consulted.  MRI brain with advanced cerebral atrophy.  Psych evaluated - "Patient is lacking and reasoning in her medical care for example she is unable to describe the consequence that she will have if she does not take her medications or has complications with her chronic conditions" and needs assistance with housing and finances due to cognitive decline.  APS/DSS pending guardianship and will need placement.     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) Active Problems:   Abdominal pain   Hypotension   Cocaine abuse (HCC)   Alcohol abuse   AIDS (acquired immune deficiency syndrome) (HCC)   Assessment and Plan:  HIV Associated Neurocognitive Disorder HIV w/ progression to AIDS  Mental status improved from admission but patient is a poor historian and does not have capacity to make decisions at this time per psychiatry DSS referral by [facility] MRI did not show any enhancing lesions but did show severe cerebral atrophy - likely HIV associated neurocognitive disorder Viral load 2 million, CD4 14 on admission Infectious disease following, started Biktarvy Continue on PCP and MAI prophylaxis with atovaquone and Zithromax Toxoplasma antibody IgG came back elevated but MRI of the brain does not show any enhancing lesions Repeat viral load down to 34,300   Generalized pain. Right hip pain. Right lower quadrant  abdominal pain. Has generalized pain, extensive imaging study did not show significant abnormalities.  Patient also complained of right hip pain, MRI of the hip did not show any fracture but My examination reviewed right lower quadrant abdominal tenderness with pain, will obtain CT with contrast to rule out appendicitis.     Diarrhea Most likely secondary to malabsorption, start Creon.   Hypotension Continue midodrine    Pancytopenia  Secondary to HIV Platelet count and WBC have normalized Hgb is stable at last check Monitor CBC periodically   Cocaine abuse  Patient has been counseled on cessation of cocaine use   Alcohol abuse Continue thiamine, multivitamin, and folic acid     Subjective:  Patient complains of right groin pain, right lower quadrant abdominal pain.  No nausea vomiting, some diarrhea.  Physical Exam: Vitals:   02/26/24 1627 02/26/24 2026 02/27/24 0424 02/27/24 0820  BP: 92/68 102/70 95/70 97/73   Pulse: 86 83 100 98  Resp: 16 16 20 18   Temp: 98.6 F (37 C) 98.1 F (36.7 C) 98 F (36.7 C) 98.7 F (37.1 C)  TempSrc: Oral Oral Oral Oral  SpO2: 100% 100% 100% 100%  Weight:   69.5 kg   Height:       General exam: Appears calm and comfortable  Respiratory system: Clear to auscultation. Respiratory effort normal. Cardiovascular system: S1 & S2 heard, RRR. No JVD, murmurs, rubs, gallops or clicks. No pedal edema. Gastrointestinal system: Abdomen is nondistended, soft and nontender. No organomegaly or masses felt. Normal bowel sounds heard. Central nervous system: Alert and oriented. No focal neurological deficits. Extremities: Symmetric 5 x  5 power. Skin: No rashes, lesions or ulcers Psychiatry: Judgement and insight appear normal. Mood & affect appropriate.    Data Reviewed:  MRI, lab results reviewed.  Family Communication: Fianc at bedside.  Disposition: Status is: Inpatient Remains inpatient appropriate because: Disease secerity.  Unsafe  discharge option.     Time spent: 35 minutes  Author: Marrion Coy, MD 02/27/2024 2:03 PM  For on call review [email or web address].

## 2024-02-27 NOTE — Plan of Care (Signed)
  Problem: Coping: Goal: Ability to adjust to condition or change in health will improve Outcome: Progressing   Problem: Fluid Volume: Goal: Ability to maintain a balanced intake and output will improve Outcome: Progressing   Problem: Health Behavior/Discharge Planning: Goal: Ability to identify and utilize available resources and services will improve Outcome: Progressing Goal: Ability to manage health-related needs will improve Outcome: Progressing   Problem: Metabolic: Goal: Ability to maintain appropriate glucose levels will improve Outcome: Progressing   Problem: Skin Integrity: Goal: Risk for impaired skin integrity will decrease Outcome: Progressing   Problem: Nutritional: Goal: Maintenance of adequate nutrition will improve Outcome: Progressing Goal: Progress toward achieving an optimal weight will improve Outcome: Progressing   Problem: Tissue Perfusion: Goal: Adequacy of tissue perfusion will improve Outcome: Progressing   Problem: Education: Goal: Knowledge of General Education information will improve Description: Including pain rating scale, medication(s)/side effects and non-pharmacologic comfort measures Outcome: Progressing   Problem: Health Behavior/Discharge Planning: Goal: Ability to manage health-related needs will improve Outcome: Progressing

## 2024-02-28 DIAGNOSIS — B20 Human immunodeficiency virus [HIV] disease: Secondary | ICD-10-CM | POA: Diagnosis not present

## 2024-02-28 DIAGNOSIS — F02818 Dementia in other diseases classified elsewhere, unspecified severity, with other behavioral disturbance: Secondary | ICD-10-CM | POA: Diagnosis not present

## 2024-02-28 DIAGNOSIS — R1084 Generalized abdominal pain: Secondary | ICD-10-CM | POA: Diagnosis not present

## 2024-02-28 NOTE — Progress Notes (Signed)
 Progress Note   Patient: Meghan Jarvis [id number]:[id number] DOB: [birth year] DOA: 12/08/2023     82 DOS: the patient was seen and examined on 02/28/2024   Brief hospital course:  48yo with h/o polysubstance (ETOH, cocaine) abuse, afib, homelessness, AIDS, hypothyroidism, and DM who presented on 12/08/23 with acute respiratory failure.  She was found down by a friend and could not wake her up.  She was found to be hypoxic in the 70s. Admitted to ICU, intubated, started on antibiotics for pneumonia.  12/31: extubated. ID consulted.  MRI brain with advanced cerebral atrophy.  Psych evaluated - "Patient is lacking and reasoning in her medical care for example she is unable to describe the consequence that she will have if she does not take her medications or has complications with her chronic conditions" and needs assistance with housing and finances due to cognitive decline.  APS/DSS pending guardianship and will need placement.     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) Active Problems:   Abdominal pain   Hypotension   Cocaine abuse (HCC)   Alcohol abuse   AIDS (acquired immune deficiency syndrome) (HCC)   RLQ abdominal pain   Assessment and Plan: HIV Associated Neurocognitive Disorder HIV w/ progression to AIDS  Mental status improved from admission but patient is a poor historian and does not have capacity to make decisions at this time per psychiatry DSS referral by [facility] MRI did not show any enhancing lesions but did show severe cerebral atrophy - likely HIV associated neurocognitive disorder Viral load 2 million, CD4 14 on admission Infectious disease following, started Biktarvy Continue on PCP and MAI prophylaxis with atovaquone and Zithromax Toxoplasma antibody IgG came back elevated but MRI of the brain does not show any enhancing lesions Repeat viral load down to 34,300   Generalized pain. Right hip pain. Right  lower quadrant abdominal pain. Has generalized pain, extensive imaging study did not show significant abnormalities.  Patient also complained of right hip pain, MRI of the hip did not show any fracture. CT scan of the abdomen did not show any acute changes, apparently patient had a history of appendectomy, but patient does not have any memory of it. Continue symptomatic treatment.     Diarrhea Most likely secondary to malabsorption, continue Creon.  Hypotension Continue midodrine        Subjective: Still complaining some pain in the right lower quadrant and the right groin, but diarrhea seems to be better.  Physical Exam: Vitals:   02/27/24 1527 02/27/24 2053 02/28/24 0400 02/28/24 0759  BP: 103/74 101/67 103/70 102/67  Pulse: 91 87 84 92  Resp: 20 16 16 18   Temp: 98.3 F (36.8 C) 97.8 F (36.6 C) 98.6 F (37 C) 98.5 F (36.9 C)  TempSrc: Oral     SpO2: 100% 99% 100% 98%  Weight:      Height:       General exam: Appears calm and comfortable  Respiratory system: Clear to auscultation. Respiratory effort normal. Cardiovascular system: S1 & S2 heard, RRR. No JVD, murmurs, rubs, gallops or clicks. No pedal edema. Gastrointestinal system: Abdomen is nondistended, soft and nontender. No organomegaly or masses felt. Normal bowel sounds heard. Central nervous system: Alert and oriented. No focal neurological deficits. Extremities: Symmetric 5 x 5 power. Skin: No rashes, lesions or ulcers Psychiatry: Flat affect.   Data Reviewed:  CT scan results reviewed.  Family Communication: Boyfriend in the room.  Disposition: Status is:  Inpatient Remains inpatient appropriate because: No safe discharge option.     Time spent: 35 minutes  Author: Marrion Coy, MD 02/28/2024 12:45 PM  For on call review [email or web address].

## 2024-02-28 NOTE — Plan of Care (Signed)
   Problem: Coping: Goal: Ability to adjust to condition or change in health will improve Outcome: Progressing   Problem: Nutritional: Goal: Maintenance of adequate nutrition will improve Outcome: Progressing

## 2024-02-28 NOTE — Progress Notes (Signed)
 Patient called twice request to have iv/saline lock removed from left for arm.  Writer able to educate patient on need for care.  Second call patient called nurse's station states "I want this thing out of my arm", writer again attempt to educate patient on possible need for iv pending care, patient states " I want is out".  Saline lock removed for left, forearm, cannula intact, dressing applied, watch site for bleeding

## 2024-02-29 DIAGNOSIS — R1084 Generalized abdominal pain: Secondary | ICD-10-CM | POA: Diagnosis not present

## 2024-02-29 DIAGNOSIS — F02818 Dementia in other diseases classified elsewhere, unspecified severity, with other behavioral disturbance: Secondary | ICD-10-CM | POA: Diagnosis not present

## 2024-02-29 DIAGNOSIS — B20 Human immunodeficiency virus [HIV] disease: Secondary | ICD-10-CM | POA: Diagnosis not present

## 2024-02-29 MED ORDER — SENNOSIDES-DOCUSATE SODIUM 8.6-50 MG PO TABS
2.0000 | ORAL_TABLET | Freq: Two times a day (BID) | ORAL | Status: DC
  Administered 2024-03-01 – 2024-05-07 (×112): 2 via ORAL
  Filled 2024-02-29 (×132): qty 2

## 2024-02-29 NOTE — Progress Notes (Signed)
 Progress Note   Patient: Meghan Jarvis [id number]:[id number] DOB: [birth year] DOA: 12/08/2023     50 DOS: the patient was seen and examined on 02/29/2024   Brief hospital course:  48yo with h/o polysubstance (ETOH, cocaine) abuse, afib, homelessness, AIDS, hypothyroidism, and DM who presented on 12/08/23 with acute respiratory failure.  She was found down by a friend and could not wake her up.  She was found to be hypoxic in the 70s. Admitted to ICU, intubated, started on antibiotics for pneumonia.  12/31: extubated. ID consulted.  MRI brain with advanced cerebral atrophy.  Psych evaluated - "Patient is lacking and reasoning in her medical care for example she is unable to describe the consequence that she will have if she does not take her medications or has complications with her chronic conditions" and needs assistance with housing and finances due to cognitive decline.  APS/DSS pending guardianship and will need placement.     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) Active Problems:   Abdominal pain   Hypotension   Cocaine abuse (HCC)   Alcohol abuse   AIDS (acquired immune deficiency syndrome) (HCC)   RLQ abdominal pain   Assessment and Plan: HIV Associated Neurocognitive Disorder HIV w/ progression to AIDS  Mental status improved from admission but patient is a poor historian and does not have capacity to make decisions at this time per psychiatry DSS referral by [facility] MRI did not show any enhancing lesions but did show severe cerebral atrophy - likely HIV associated neurocognitive disorder Viral load 2 million, CD4 14 on admission Infectious disease following, started Biktarvy Continue on PCP and MAI prophylaxis with atovaquone and Zithromax Toxoplasma antibody IgG came back elevated but MRI of the brain does not show any enhancing lesions Repeat viral load down to 34,300   Generalized pain. Right hip pain. Right  lower quadrant abdominal pain. Has generalized pain, extensive imaging study did not show significant abnormalities.  Patient also complained of right hip pain, MRI of the hip did not show any fracture. CT scan of the abdomen did not show any acute changes, apparently patient had a history of appendectomy, but patient does not have any memory of it. Continue symptomatic treatment.     Diarrhea due to malabsorption. Diarrhea resolved after starting Creon, now starting to have constipation, added senna   Hypotension Continue midodrine      Subjective:  Patient still complaining of right lower quadrant abdominal pain, constipated.  No nausea vomiting.  Physical Exam: Vitals:   02/28/24 1748 02/28/24 2044 02/29/24 0356 02/29/24 0414  BP: 103/72 100/72 107/73   Pulse: 82 80 87   Resp: 18 20 20    Temp: 98.4 F (36.9 C) 97.6 F (36.4 C) 98.3 F (36.8 C)   TempSrc: Oral Oral    SpO2: 99% 100% 100%   Weight:    68.6 kg  Height:       General exam: Appears calm and comfortable  Respiratory system: Clear to auscultation. Respiratory effort normal. Cardiovascular system: S1 & S2 heard, RRR. No JVD, murmurs, rubs, gallops or clicks. No pedal edema. Gastrointestinal system: Abdomen is nondistended, soft and nontender. No organomegaly or masses felt. Normal bowel sounds heard. Central nervous system: Alert and oriented. No focal neurological deficits. Extremities: Symmetric 5 x 5 power. Skin: No rashes, lesions or ulcers Psychiatry: Judgement and insight appear normal. Mood & affect appropriate.    Data Reviewed:  There are no new results to  review at this time.  Family Communication: Boyfriend at bedside.  Disposition: Status is: Inpatient Remains inpatient appropriate because: Unsafe discharge option.     Time spent: 35 minutes  Author: Marrion Coy, MD 02/29/2024 1:27 PM  For on call review [email or web address].

## 2024-02-29 NOTE — Progress Notes (Signed)
 Mobility Specialist - Progress Note   02/29/24 1100  Mobility  Activity Refused mobility     Pt declined mobility; no reason specified. Will attempt another date/time.    Filiberto Pinks Mobility Specialist 02/29/24, 11:35 AM

## 2024-02-29 NOTE — TOC Progression Note (Signed)
 Transition of Care [facility]) - Progression Note    Patient Details  Name: ROSLIND MICHAUX MRN: [id number] Date of Birth: [birth year]  Transition of Care [facility]) CM/SW Contact  Chapman Fitch, RN Phone Number: 02/29/2024, 4:38 PM  Clinical Narrative:     Dss guardianship pending        Expected Discharge Plan and Services         Expected Discharge Date: 01/10/24                                     Social Determinants of Health (SDOH) Interventions SDOH Screenings   Food Insecurity: Patient Unable To Answer (12/09/2023)  Recent Concern: Food Insecurity - Food Insecurity Present (09/26/2023)  Housing: Patient Unable To Answer (12/09/2023)  Recent Concern: Housing - Medium Risk (09/26/2023)  Transportation Needs: Patient Unable To Answer (12/09/2023)  Recent Concern: Transportation Needs - Unmet Transportation Needs (10/11/2023)  Utilities: Patient Unable To Answer (12/09/2023)  Recent Concern: Utilities - At Risk (09/25/2023)  Tobacco Use: High Risk (12/08/2023)    Readmission Risk Interventions     No data to display

## 2024-02-29 NOTE — Progress Notes (Signed)
 Brief Nutrition Follow-Up Note  Wt Readings from Last 15 Encounters:  02/29/24 68.6 kg  12/06/23 45.4 kg  11/28/23 49 kg  11/04/23 49 kg  09/23/23 49.9 kg  07/12/22 90.7 kg  01/30/22 90.7 kg  06/07/21 95 kg  06/06/21 95.3 kg  03/22/21 95.3 kg  01/27/21 72.6 kg  08/15/20 90.7 kg  05/09/20 90.7 kg  09/29/19 81.6 kg  11/23/18 99.8 kg   47 y/o female with h/o homelessness, HIV, substance abuse, Afib, mood disorder, hypothyroidism, type 2 diabetes mellitus and depression who is admitted with aspiration PNA.   Per psych note on 01/28/24, pt does not have capacity to make medical decisions. Per TOC notes, DSS guardianship pending.   Pt remains ion a dysphagia 3 diet with improved oral intake. Noted meal completions 100%. Ensure supplements have been d/c- RD agrees due to improved oral intake.   Wt has been stable over the past month.   Medications reviewed and include biktarvy, colace, folic acid, lovenox, creon, protonix, and thiamine.   Labs reviewed: CBGS: 103.  INTERVENTION:   -Continue MVI with minerals daily -Continue 1 mg folic acid daily -Continue 161 mg thiamine daily -Continue Magic cup TID with meals, each supplement provides 290 kcal and 9 grams of protein    NUTRITION DIAGNOSIS:    Severe Malnutrition related to chronic illness (HIV, homelessness, substance abuse) as evidenced by percent weight loss, severe fat depletion, severe muscle depletion.   Ongoing   GOAL:    Patient will meet greater than or equal to 90% of their needs   Progressing  No further nutrition interventions warranted at this time. If nutrition issues arise, please consult RD.   Levada Schilling, RD, LDN, CDCES Registered Dietitian III Certified Diabetes Care and Education Specialist If unable to reach this RD, please use "RD Inpatient" group chat on secure chat between hours of 8am-4 pm daily

## 2024-03-01 DIAGNOSIS — R1031 Right lower quadrant pain: Secondary | ICD-10-CM | POA: Diagnosis not present

## 2024-03-01 DIAGNOSIS — B20 Human immunodeficiency virus [HIV] disease: Secondary | ICD-10-CM | POA: Diagnosis not present

## 2024-03-01 DIAGNOSIS — F02818 Dementia in other diseases classified elsewhere, unspecified severity, with other behavioral disturbance: Secondary | ICD-10-CM | POA: Diagnosis not present

## 2024-03-01 NOTE — Plan of Care (Signed)
  Problem: Fluid Volume: Goal: Ability to maintain a balanced intake and output will improve Outcome: Progressing   Problem: Tissue Perfusion: Goal: Adequacy of tissue perfusion will improve Outcome: Progressing   Problem: Health Behavior/Discharge Planning: Goal: Ability to manage health-related needs will improve Outcome: Progressing

## 2024-03-01 NOTE — Progress Notes (Signed)
 Progress Note   Patient: Meghan Jarvis BJY:[id number] DOB: [birth year] DOA: 12/08/2023     84 DOS: the patient was seen and examined on 03/01/2024   Brief hospital course:  47yo with h/o polysubstance (ETOH, cocaine) abuse, afib, homelessness, AIDS, hypothyroidism, and DM who presented on 12/08/23 with acute respiratory failure.  She was found down by a friend and could not wake her up.  She was found to be hypoxic in the 70s. Admitted to ICU, intubated, started on antibiotics for pneumonia.  12/31: extubated. ID consulted.  MRI brain with advanced cerebral atrophy.  Psych evaluated - "Patient is lacking and reasoning in her medical care for example she is unable to describe the consequence that she will have if she does not take her medications or has complications with her chronic conditions" and needs assistance with housing and finances due to cognitive decline.  APS/DSS pending guardianship and will need placement.     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) Active Problems:   Abdominal pain   Hypotension   Cocaine abuse (HCC)   Alcohol abuse   AIDS (acquired immune deficiency syndrome) (HCC)   RLQ abdominal pain   Assessment and Plan: HIV Associated Neurocognitive Disorder HIV w/ progression to AIDS  Mental status improved from admission but patient is a poor historian and does not have capacity to make decisions at this time per psychiatry DSS referral by [facility] MRI did not show any enhancing lesions but did show severe cerebral atrophy - likely HIV associated neurocognitive disorder Viral load 2 million, CD4 14 on admission Infectious disease following, started Biktarvy Continue on PCP and MAI prophylaxis with atovaquone and Zithromax Toxoplasma antibody IgG came back elevated but MRI of the brain does not show any enhancing lesions Repeat viral load down to 34,300   Generalized pain. Right hip pain. Right  lower quadrant abdominal pain. Has generalized pain, extensive imaging study did not show significant abnormalities.  Patient also complained of right hip pain, MRI of the hip did not show any fracture. CT scan of the abdomen did not show any acute changes, apparently patient had a history of appendectomy, but patient does not have any memory of it. Continue symptomatic treatment.     Diarrhea due to malabsorption. Diarrhea resolved after starting Creon, now starting to have constipation, added senna.  Had 2 normal bowel movements yesterday.   Hypotension Continue midodrine      Subjective:  Still complaining of right groin pain, right lower abdominal pain.  Had 2 normal bowel movements yesterday after stool softener.  No fever or chills.  Physical Exam: Vitals:   02/29/24 1953 03/01/24 0500 03/01/24 0512 03/01/24 1100  BP: 103/75  (!) 95/48 (!) 102/50  Pulse: 84  90 95  Resp: 18  18 20   Temp: 98.6 F (37 C)  98.4 F (36.9 C) 98.2 F (36.8 C)  TempSrc: Oral  Oral Oral  SpO2: 99%  100% 98%  Weight:  69.7 kg    Height:       General exam: Appears calm and comfortable  Respiratory system: Clear to auscultation. Respiratory effort normal. Cardiovascular system: S1 & S2 heard, RRR. No JVD, murmurs, rubs, gallops or clicks. No pedal edema. Gastrointestinal system: Abdomen is nondistended, soft and nontender. No organomegaly or masses felt. Normal bowel sounds heard. Central nervous system: Alert and oriented. No focal neurological deficits. Extremities: Symmetric 5 x 5 power. Skin: No rashes, lesions or ulcers Psychiatry: Judgement  and insight appear normal. Mood & affect appropriate.    Data Reviewed:  There are no new results to review at this time.  Family Communication: None  Disposition: Status is: Inpatient Remains inpatient appropriate because: Unsafe discharge option.     Time spent: 25 minutes  Author: Marrion Coy, MD 03/01/2024 12:27 PM  For on call  review [email or web address].

## 2024-03-02 DIAGNOSIS — B20 Human immunodeficiency virus [HIV] disease: Secondary | ICD-10-CM | POA: Diagnosis not present

## 2024-03-02 DIAGNOSIS — R1031 Right lower quadrant pain: Secondary | ICD-10-CM | POA: Diagnosis not present

## 2024-03-02 DIAGNOSIS — F02818 Dementia in other diseases classified elsewhere, unspecified severity, with other behavioral disturbance: Secondary | ICD-10-CM | POA: Diagnosis not present

## 2024-03-02 NOTE — Plan of Care (Signed)
  Problem: Coping: Goal: Ability to adjust to condition or change in health will improve Outcome: Progressing   Problem: Fluid Volume: Goal: Ability to maintain a balanced intake and output will improve Outcome: Progressing   Problem: Health Behavior/Discharge Planning: Goal: Ability to identify and utilize available resources and services will improve Outcome: Progressing Goal: Ability to manage health-related needs will improve Outcome: Progressing   Problem: Metabolic: Goal: Ability to maintain appropriate glucose levels will improve Outcome: Progressing   Problem: Nutritional: Goal: Maintenance of adequate nutrition will improve Outcome: Progressing Goal: Progress toward achieving an optimal weight will improve Outcome: Progressing   Problem: Skin Integrity: Goal: Risk for impaired skin integrity will decrease Outcome: Progressing   Problem: Tissue Perfusion: Goal: Adequacy of tissue perfusion will improve Outcome: Progressing   Problem: Education: Goal: Knowledge of General Education information will improve Description: Including pain rating scale, medication(s)/side effects and non-pharmacologic comfort measures Outcome: Progressing   Problem: Health Behavior/Discharge Planning: Goal: Ability to manage health-related needs will improve Outcome: Progressing   Problem: Clinical Measurements: Goal: Ability to maintain clinical measurements within normal limits will improve Outcome: Progressing Goal: Will remain free from infection Outcome: Progressing Goal: Diagnostic test results will improve Outcome: Progressing Goal: Respiratory complications will improve Outcome: Progressing Goal: Cardiovascular complication will be avoided Outcome: Progressing   Problem: Activity: Goal: Risk for activity intolerance will decrease Outcome: Progressing   Problem: Nutrition: Goal: Adequate nutrition will be maintained Outcome: Progressing   Problem: Coping: Goal:  Level of anxiety will decrease Outcome: Progressing   Problem: Elimination: Goal: Will not experience complications related to bowel motility Outcome: Progressing Goal: Will not experience complications related to urinary retention Outcome: Progressing   Problem: Pain Management: Goal: General experience of comfort will improve Outcome: Progressing   Problem: Safety: Goal: Ability to remain free from injury will improve Outcome: Progressing   Problem: Skin Integrity: Goal: Risk for impaired skin integrity will decrease Outcome: Progressing   Problem: Activity: Goal: Ability to tolerate increased activity will improve Outcome: Progressing   Problem: Respiratory: Goal: Ability to maintain a clear airway and adequate ventilation will improve Outcome: Progressing

## 2024-03-02 NOTE — Plan of Care (Signed)
  Problem: Coping: Goal: Ability to adjust to condition or change in health will improve Outcome: Adequate for Discharge   

## 2024-03-02 NOTE — Progress Notes (Signed)
 Progress Note   Patient: Meghan Jarvis WUJ:[id number] DOB: 11-25-1976 DOA: 12/08/2023     85 DOS: the patient was seen and examined on 03/02/2024   Brief hospital course:  48yo with h/o polysubstance (ETOH, cocaine) abuse, afib, homelessness, AIDS, hypothyroidism, and DM who presented on 12/08/23 with acute respiratory failure.  She was found down by a friend and could not wake her up.  She was found to be hypoxic in the 70s. Admitted to ICU, intubated, started on antibiotics for pneumonia.  12/31: extubated. ID consulted.  MRI brain with advanced cerebral atrophy.  Psych evaluated - "Patient is lacking and reasoning in her medical care for example she is unable to describe the consequence that she will have if she does not take her medications or has complications with her chronic conditions" and needs assistance with housing and finances due to cognitive decline.  APS/DSS pending guardianship and will need placement.     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) Active Problems:   Abdominal pain   Hypotension   Cocaine abuse (HCC)   Alcohol abuse   AIDS (acquired immune deficiency syndrome) (HCC)   RLQ abdominal pain   Assessment and Plan: HIV Associated Neurocognitive Disorder HIV w/ progression to AIDS  Mental status improved from admission but patient is a poor historian and does not have capacity to make decisions at this time per psychiatry DSS referral by [facility] MRI did not show any enhancing lesions but did show severe cerebral atrophy - likely HIV associated neurocognitive disorder Viral load 2 million, CD4 14 on admission Infectious disease following, started Biktarvy Continue on PCP and MAI prophylaxis with atovaquone and Zithromax Toxoplasma antibody IgG came back elevated but MRI of the brain does not show any enhancing lesions Repeat viral load down to 34,300   Generalized pain. Right hip pain. Right  lower quadrant abdominal pain. Has generalized pain, extensive imaging study did not show significant abnormalities.  Patient also complained of right hip pain, MRI of the hip did not show any fracture but My examination reviewed right lower quadrant abdominal tenderness with pain, will obtain CT with contrast to rule out appendicitis.     Diarrhea Most likely secondary to malabsorption, start Creon.   Hypotension Continue midodrine     Pancytopenia  Secondary to HIV Platelet count and WBC have normalized Hemoglobin has been stable.   Cocaine abuse  Patient has been counseled on cessation of cocaine use   Alcohol abuse Continue thiamine, multivitamin, and folic acid        Subjective:  Patient still complaining of right lower quadrant and right groin pain.  Has normal bowel movements.  Physical Exam: Vitals:   03/01/24 1100 03/01/24 2011 03/02/24 0333 03/02/24 0808  BP: (!) 102/50 111/73 110/70 104/83  Pulse: 95 81 80 83  Resp: 20 18 16 16   Temp: 98.2 F (36.8 C) 98.5 F (36.9 C) 98.6 F (37 C) 97.9 F (36.6 C)  TempSrc: Oral Oral Oral   SpO2: 98% 100% 99% 100%  Weight:      Height:       General exam: Appears calm and comfortable  Respiratory system: Clear to auscultation. Respiratory effort normal. Cardiovascular system: S1 & S2 heard, RRR. No JVD, murmurs, rubs, gallops or clicks. No pedal edema. Gastrointestinal system: Abdomen is nondistended, soft and nontender. No organomegaly or masses felt. Normal bowel sounds heard. Central nervous system: Alert and oriented. No focal neurological deficits. Extremities: Symmetric  5 x 5 power. Skin: No rashes, lesions or ulcers Psychiatry: Judgement and insight appear normal. Mood & affect appropriate.    Data Reviewed:  Reviewed prior labs.  Family Communication: None  Disposition: Status is: Inpatient Remains inpatient appropriate because: Unsafe discharge option.     Time spent: 25  minutes  Author: Marrion Coy, MD 03/02/2024 11:25 AM  For on call review [email or web address].

## 2024-03-03 ENCOUNTER — Other Ambulatory Visit: Payer: Self-pay

## 2024-03-03 DIAGNOSIS — R1031 Right lower quadrant pain: Secondary | ICD-10-CM | POA: Diagnosis not present

## 2024-03-03 DIAGNOSIS — F02818 Dementia in other diseases classified elsewhere, unspecified severity, with other behavioral disturbance: Secondary | ICD-10-CM | POA: Diagnosis not present

## 2024-03-03 DIAGNOSIS — B20 Human immunodeficiency virus [HIV] disease: Secondary | ICD-10-CM | POA: Diagnosis not present

## 2024-03-03 NOTE — TOC Progression Note (Signed)
 Transition of Care [facility]) - Progression Note    Patient Details  Name: Meghan Jarvis MRN: [id number] Date of Birth: 1976/08/18  [facility] of [facility] CM/SW Contact  Chapman Fitch, RN Phone Number: 03/03/2024, 4:02 PM  Clinical Narrative:     Secure email sent to [facility] with [facility] dss to determine if there is an update    Per [place] she is to discuss with her supervisor today and provide update to [facility] tomorrow      Expected Discharge Plan and Services         Expected Discharge Date: 01/10/24                                     Social Determinants of Health (SDOH) Interventions SDOH Screenings   Food Insecurity: Patient Unable To Answer (12/09/2023)  Recent Concern: Food Insecurity - Food Insecurity Present (09/26/2023)  Housing: Patient Unable To Answer (12/09/2023)  Recent Concern: Housing - Medium Risk (09/26/2023)  Transportation Needs: Patient Unable To Answer (12/09/2023)  Recent Concern: Transportation Needs - Unmet Transportation Needs (10/11/2023)  Utilities: Patient Unable To Answer (12/09/2023)  Recent Concern: Utilities - At Risk (09/25/2023)  Tobacco Use: High Risk (12/08/2023)    Readmission Risk Interventions     No data to display

## 2024-03-03 NOTE — Progress Notes (Signed)
 Progress Note   Patient: Meghan Jarvis [id number] DOB: Aug 15, 1976 DOA: 12/08/2023     86 DOS: the patient was seen and examined on 03/03/2024   Brief hospital course:  47yo with h/o polysubstance (ETOH, cocaine) abuse, afib, homelessness, AIDS, hypothyroidism, and DM who presented on 12/08/23 with acute respiratory failure.  She was found down by a friend and could not wake her up.  She was found to be hypoxic in the 70s. Admitted to ICU, intubated, started on antibiotics for pneumonia.  12/31: extubated. ID consulted.  MRI brain with advanced cerebral atrophy.  Psych evaluated - "Patient is lacking and reasoning in her medical care for example she is unable to describe the consequence that she will have if she does not take her medications or has complications with her chronic conditions" and needs assistance with housing and finances due to cognitive decline.  APS/DSS pending guardianship and will need placement.     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) Active Problems:   Abdominal pain   Hypotension   Cocaine abuse (HCC)   Alcohol abuse   AIDS (acquired immune deficiency syndrome) (HCC)   RLQ abdominal pain   Assessment and Plan: HIV Associated Neurocognitive Disorder HIV w/ progression to AIDS  Mental status improved from admission but patient is a poor historian and does not have capacity to make decisions at this time per psychiatry DSS referral by [facility] MRI did not show any enhancing lesions but did show severe cerebral atrophy - likely HIV associated neurocognitive disorder Viral load 2 million, CD4 14 on admission Infectious disease following, started Biktarvy Continue on PCP and MAI prophylaxis with atovaquone and Zithromax Toxoplasma antibody IgG came back elevated but MRI of the brain does not show any enhancing lesions Repeat viral load down to 34,300   Generalized pain. Right hip pain. Right  lower quadrant abdominal pain. Has generalized pain, extensive imaging study did not show significant abnormalities.  Patient also complained of right hip pain, MRI of the hip did not show any fracture but My examination reviewed right lower quadrant abdominal tenderness with pain, will obtain CT with contrast to rule out appendicitis.     Diarrhea Most likely secondary to malabsorption, start Creon.   Hypotension Continue midodrine  Condition stable, no change in treatment plan.    Subjective:  Patient doing well, still complaining of right lower quadrant abdominal pain and a right hip pain.  Normal bowel movement.  Physical Exam: Vitals:   03/02/24 1605 03/02/24 2024 03/03/24 0423 03/03/24 0843  BP: (!) 85/55 105/71 100/74 95/62  Pulse: 78 83 81 100  Resp: 16 20 20 14   Temp: 98 F (36.7 C) 98.3 F (36.8 C) 97.7 F (36.5 C) 98.1 F (36.7 C)  TempSrc:  Oral Oral Oral  SpO2: 100% 100% 100% 100%  Weight:   68.6 kg   Height:       General exam: Appears calm and comfortable  Respiratory system: Clear to auscultation. Respiratory effort normal. Cardiovascular system: S1 & S2 heard, RRR. No JVD, murmurs, rubs, gallops or clicks. No pedal edema. Gastrointestinal system: Abdomen is nondistended, soft and nontender. No organomegaly or masses felt. Normal bowel sounds heard. Central nervous system: Alert and oriented. No focal neurological deficits. Extremities: Symmetric 5 x 5 power. Skin: No rashes, lesions or ulcers Psychiatry: Judgement and insight appear normal. Mood & affect appropriate.    Data Reviewed:  There are no new results to review at  this time.  Family Communication: None  Disposition: Status is: Inpatient Remains inpatient appropriate because: Unsafe discharge option.     Time spent: 25 minutes  Author: Marrion Coy, MD 03/03/2024 12:30 PM  For on call review [email or web address].

## 2024-03-04 DIAGNOSIS — F02818 Dementia in other diseases classified elsewhere, unspecified severity, with other behavioral disturbance: Secondary | ICD-10-CM | POA: Diagnosis not present

## 2024-03-04 DIAGNOSIS — B20 Human immunodeficiency virus [HIV] disease: Secondary | ICD-10-CM | POA: Diagnosis not present

## 2024-03-04 DIAGNOSIS — R1031 Right lower quadrant pain: Secondary | ICD-10-CM | POA: Diagnosis not present

## 2024-03-04 MED ORDER — MIDODRINE HCL 5 MG PO TABS
5.0000 mg | ORAL_TABLET | Freq: Three times a day (TID) | ORAL | Status: DC
  Administered 2024-03-04 – 2024-03-21 (×49): 5 mg via ORAL
  Filled 2024-03-04 (×48): qty 1

## 2024-03-04 NOTE — Progress Notes (Signed)
 Progress Note   Patient: Meghan Jarvis [id number] DOB: 1976/10/04 DOA: 12/08/2023     87 DOS: the patient was seen and examined on 03/04/2024   Brief hospital course:  48yo with h/o polysubstance (ETOH, cocaine) abuse, afib, homelessness, AIDS, hypothyroidism, and DM who presented on 12/08/23 with acute respiratory failure.  She was found down by a friend and could not wake her up.  She was found to be hypoxic in the 70s. Admitted to ICU, intubated, started on antibiotics for pneumonia.  12/31: extubated. ID consulted.  MRI brain with advanced cerebral atrophy.  Psych evaluated - "Patient is lacking and reasoning in her medical care for example she is unable to describe the consequence that she will have if she does not take her medications or has complications with her chronic conditions" and needs assistance with housing and finances due to cognitive decline.  APS/DSS pending guardianship and will need placement.     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) Active Problems:   Abdominal pain   Hypotension   Cocaine abuse (HCC)   Alcohol abuse   AIDS (acquired immune deficiency syndrome) (HCC)   RLQ abdominal pain   Assessment and Plan: HIV Associated Neurocognitive Disorder HIV w/ progression to AIDS  Mental status improved from admission but patient is a poor historian and does not have capacity to make decisions at this time per psychiatry DSS referral by [facility] MRI did not show any enhancing lesions but did show severe cerebral atrophy - likely HIV associated neurocognitive disorder Viral load 2 million, CD4 14 on admission Infectious disease following, started Biktarvy Continue on PCP and MAI prophylaxis with atovaquone and Zithromax Toxoplasma antibody IgG came back elevated but MRI of the brain does not show any enhancing lesions Repeat viral load down to 34,300   Generalized pain. Right hip pain. Right  lower quadrant abdominal pain. Has generalized pain, extensive imaging study did not show significant abnormalities.  Patient also complained of right hip pain, MRI of the hip did not show any fracture but My examination reviewed right lower quadrant abdominal tenderness with pain, will obtain CT with contrast to rule out appendicitis.     Diarrhea Most likely secondary to malabsorption, started Creon.  Diarrhea.  Resolved.  Now has constipation, added stool softener.   Hypotension Pressures better, reduce dose of midodrine.      Subjective:  Complaining of right lower quadrant and right hip pain.  Physical Exam: Vitals:   03/03/24 1947 03/04/24 0331 03/04/24 0500 03/04/24 0839  BP: 103/70 110/82  114/79  Pulse: 89 89  94  Resp: 20 18  17   Temp: 98.5 F (36.9 C) 98.1 F (36.7 C)  97.7 F (36.5 C)  TempSrc: Oral Oral    SpO2: 100% 100%  100%  Weight:   72.1 kg   Height:       General exam: Appears calm and comfortable  Respiratory system: Clear to auscultation. Respiratory effort normal. Cardiovascular system: S1 & S2 heard, RRR. No JVD, murmurs, rubs, gallops or clicks. No pedal edema. Gastrointestinal system: Abdomen is nondistended, soft and nontender. No organomegaly or masses felt. Normal bowel sounds heard. Central nervous system: Alert and oriented. No focal neurological deficits. Extremities: Symmetric 5 x 5 power. Skin: No rashes, lesions or ulcers Psychiatry: Judgement and insight appear normal. Mood & affect appropriate.    Data Reviewed:  There are no new results to review at this time.  Family Communication: None  Disposition: Status is: Inpatient Remains inpatient appropriate because: Unsafe discharge.     Time spent: 35 minutes  Author: Marrion Coy, MD 03/04/2024 3:09 PM  For on call review [email or web address].

## 2024-03-05 DIAGNOSIS — F02818 Dementia in other diseases classified elsewhere, unspecified severity, with other behavioral disturbance: Secondary | ICD-10-CM | POA: Diagnosis not present

## 2024-03-05 DIAGNOSIS — B20 Human immunodeficiency virus [HIV] disease: Secondary | ICD-10-CM | POA: Diagnosis not present

## 2024-03-05 LAB — URINE DRUG SCREEN, QUALITATIVE (ARMC ONLY)
Amphetamines, Ur Screen: NOT DETECTED
Barbiturates, Ur Screen: NOT DETECTED
Benzodiazepine, Ur Scrn: NOT DETECTED
Cannabinoid 50 Ng, Ur ~~LOC~~: NOT DETECTED
Cocaine Metabolite,Ur ~~LOC~~: NOT DETECTED
MDMA (Ecstasy)Ur Screen: NOT DETECTED
Methadone Scn, Ur: NOT DETECTED
Opiate, Ur Screen: NOT DETECTED
Phencyclidine (PCP) Ur S: NOT DETECTED
Tricyclic, Ur Screen: POSITIVE — AB

## 2024-03-05 LAB — CBC WITH DIFFERENTIAL/PLATELET
Abs Immature Granulocytes: 0 10*3/uL (ref 0.00–0.07)
Basophils Absolute: 0 10*3/uL (ref 0.0–0.1)
Basophils Relative: 1 %
Eosinophils Absolute: 0.4 10*3/uL (ref 0.0–0.5)
Eosinophils Relative: 9 %
HCT: 29.7 % — ABNORMAL LOW (ref 36.0–46.0)
Hemoglobin: 9.6 g/dL — ABNORMAL LOW (ref 12.0–15.0)
Immature Granulocytes: 0 %
Lymphocytes Relative: 44 %
Lymphs Abs: 1.8 10*3/uL (ref 0.7–4.0)
MCH: 27.4 pg (ref 26.0–34.0)
MCHC: 32.3 g/dL (ref 30.0–36.0)
MCV: 84.9 fL (ref 80.0–100.0)
Monocytes Absolute: 0.4 10*3/uL (ref 0.1–1.0)
Monocytes Relative: 10 %
Neutro Abs: 1.5 10*3/uL — ABNORMAL LOW (ref 1.7–7.7)
Neutrophils Relative %: 36 %
Platelets: 316 10*3/uL (ref 150–400)
RBC: 3.5 MIL/uL — ABNORMAL LOW (ref 3.87–5.11)
RDW: 14.6 % (ref 11.5–15.5)
WBC: 4.1 10*3/uL (ref 4.0–10.5)
nRBC: 0 % (ref 0.0–0.2)

## 2024-03-05 LAB — COMPREHENSIVE METABOLIC PANEL
ALT: 33 U/L (ref 0–44)
AST: 33 U/L (ref 15–41)
Albumin: 3.2 g/dL — ABNORMAL LOW (ref 3.5–5.0)
Alkaline Phosphatase: 59 U/L (ref 38–126)
Anion gap: 6 (ref 5–15)
BUN: 13 mg/dL (ref 6–20)
CO2: 24 mmol/L (ref 22–32)
Calcium: 9.1 mg/dL (ref 8.9–10.3)
Chloride: 109 mmol/L (ref 98–111)
Creatinine, Ser: 0.72 mg/dL (ref 0.44–1.00)
GFR, Estimated: >60 mL/min (ref >60)
Glucose, Bld: 94 mg/dL (ref 70–99)
Potassium: 4.1 mmol/L (ref 3.5–5.1)
Sodium: 139 mmol/L (ref 135–145)
Total Bilirubin: 0.4 mg/dL (ref 0.0–1.2)
Total Protein: 7.5 g/dL (ref 6.5–8.1)
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
## 2024-03-05 NOTE — Plan of Care (Signed)
  Problem: Coping: Goal: Ability to adjust to condition or change in health will improve Outcome: Progressing   Problem: Fluid Volume: Goal: Ability to maintain a balanced intake and output will improve Outcome: Progressing   Problem: Health Behavior/Discharge Planning: Goal: Ability to identify and utilize available resources and services will improve Outcome: Progressing Goal: Ability to manage health-related needs will improve Outcome: Progressing   Problem: Metabolic: Goal: Ability to maintain appropriate glucose levels will improve Outcome: Progressing   Problem: Nutritional: Goal: Maintenance of adequate nutrition will improve Outcome: Progressing Goal: Progress toward achieving an optimal weight will improve Outcome: Progressing   Problem: Skin Integrity: Goal: Risk for impaired skin integrity will decrease Outcome: Progressing   Problem: Education: Goal: Knowledge of General Education information will improve Description: Including pain rating scale, medication(s)/side effects and non-pharmacologic comfort measures Outcome: Progressing   Problem: Health Behavior/Discharge Planning: Goal: Ability to manage health-related needs will improve Outcome: Progressing   Problem: Tissue Perfusion: Goal: Adequacy of tissue perfusion will improve Outcome: Progressing   Problem: Clinical Measurements: Goal: Ability to maintain clinical measurements within normal limits will improve Outcome: Progressing Goal: Will remain free from infection Outcome: Progressing Goal: Diagnostic test results will improve Outcome: Progressing Goal: Respiratory complications will improve Outcome: Progressing Goal: Cardiovascular complication will be avoided Outcome: Progressing   Problem: Nutrition: Goal: Adequate nutrition will be maintained Outcome: Progressing   Problem: Coping: Goal: Level of anxiety will decrease Outcome: Progressing   Problem: Elimination: Goal: Will not  experience complications related to bowel motility Outcome: Progressing Goal: Will not experience complications related to urinary retention Outcome: Progressing   Problem: Safety: Goal: Ability to remain free from injury will improve Outcome: Progressing   Problem: Pain Management: Goal: General experience of comfort will improve Outcome: Progressing   Problem: Activity: Goal: Ability to tolerate increased activity will improve Outcome: Progressing   Problem: Respiratory: Goal: Ability to maintain a clear airway and adequate ventilation will improve Outcome: Progressing

## 2024-03-05 NOTE — Progress Notes (Signed)
 Progress Note    Meghan Jarvis  WUJ:[id number] DOB: 1976-05-03  DOA: 12/08/2023 [name]      Brief Narrative:    Medical records reviewed and are as summarized below:  Meghan Jarvis is a 48 y.o. female with polysubstance use disorder (ETOH, cocaine), atrial fibrillation,, homelessness, AIDS, hypothyroidism, and DM who presented on 12/08/23 with acute respiratory failure.  She was found down by a friend and could not wake her up.  She was found to be hypoxic in the 70s. Admitted to ICU, intubated, started on antibiotics for pneumonia.  12/31: extubated. ID consulted.  MRI brain with advanced cerebral atrophy.  Psych evaluated - "Patient is lacking and reasoning in her medical care for example she is unable to describe the consequence that she will have if she does not take her medications or has complications with her chronic conditions" and needs assistance with housing and finances due to cognitive decline.  APS/DSS pending guardianship and will need placement.        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   HIV Associated Neurocognitive Disorder HIV w/ progression to AIDS  Mental status improved from admission but patient is a poor historian and does not have capacity to make decisions at this time per psychiatry DSS referral by [facility] MRI did not show any enhancing lesions but did show severe cerebral atrophy - likely HIV associated neurocognitive disorder Viral  load 2 million, CD4 14 on admission Infectious disease following, started Biktarvy Continue on PCP and MAI prophylaxis with atovaquone and Zithromax Toxoplasma antibody IgG came back elevated but MRI of the brain does not show any enhancing lesions Repeat viral load down to 34,300   Generalized pain. Right hip pain. Right lower quadrant abdominal pain. Has generalized pain, extensive imaging study did not show significant abnormalities.   MRI right hip showed intramuscular edema enhancement within the right gluteus medius and minimus muscles and lateral aspect of right gluteus maximus muscle.  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  There is also evidence of moderate osteoarthritis of the right hip. Consulted Dr. Dallas Schimke, orthopedic surgeon, to assist with management    Diarrhea Improved    Hypotension BP stable.  Continue midodrine      Diet Order             DIET DYS 3 Room service appropriate? Yes; Fluid consistency: Thin  Diet effective now                            Consultants: ID specialist Orthopedic surgery  Procedures: None    Medications:    atovaquone  1,500 mg Oral Q breakfast   azithromycin  1,200 mg Oral Weekly   bictegravir-emtricitabine-tenofovir AF  1 tablet Oral Daily   DULoxetine  30 mg Oral QHS   enoxaparin (LOVENOX) injection  40 mg Subcutaneous QHS   folic acid  1 mg Oral Daily   hydrocerin   Topical BID  lidocaine  1 patch Transdermal Q24H   lipase/protease/amylase  24,000 Units Oral TID AC   midodrine  5 mg Oral TID WC   multivitamin with minerals  1 tablet Oral QHS   pantoprazole  40 mg Oral BID   senna-docusate  2 tablet Oral BID   thiamine  100 mg Oral Daily   Continuous Infusions:   Anti-infectives (From admission, onward)    Start     Dose/Rate Route Frequency Ordered Stop   01/25/24 0000  azithromycin (ZITHROMAX) 600 MG tablet        1,200 mg Oral Weekly 01/25/24 1610      01/25/24 0000  bictegravir-emtricitabine-tenofovir AF (BIKTARVY) 50-200-25 MG TABS tablet        1 tablet Oral Daily 01/25/24 1610     01/25/24 0000  atovaquone (MEPRON) 750 MG/5ML suspension        1,500 mg Oral Daily with breakfast 01/25/24 1610     12/25/23 0800  atovaquone (MEPRON) 750 MG/5ML suspension 1,500 mg        1,500 mg Oral Daily with breakfast 12/24/23 1558     12/24/23 1000  sulfamethoxazole-trimethoprim (BACTRIM) 400-80 MG per tablet 1 tablet  Status:  Discontinued        1 tablet Oral Daily 12/24/23 0749 12/24/23 1557   12/19/23 1400  azithromycin (ZITHROMAX) tablet 1,200 mg        1,200 mg Oral Weekly 12/18/23 1346     12/18/23 1000  sulfamethoxazole-trimethoprim (BACTRIM DS) 800-160 MG per tablet 1 tablet  Status:  Discontinued        1 tablet Oral Daily 12/17/23 1621 12/24/23 0748   12/14/23 1530  bictegravir-emtricitabine-tenofovir AF (BIKTARVY) 50-200-25 MG per tablet 1 tablet        1 tablet Oral Daily 12/14/23 1442     12/12/23 1000  amoxicillin-clavulanate (AUGMENTIN) 875-125 MG per tablet 1 tablet        1 tablet Oral Every 12 hours 12/11/23 1345 12/12/23 2131   12/12/23 1000  azithromycin (ZITHROMAX) tablet 1,250 mg  Status:  Discontinued        1,200 mg Oral Weekly 12/11/23 1618 12/18/23 1346   12/11/23 2000  sulfamethoxazole-trimethoprim (BACTRIM) 400-80 MG per tablet 1 tablet  Status:  Discontinued        1 tablet Oral Daily 12/11/23 1618 12/17/23 1621   12/09/23 2200  azithromycin (ZITHROMAX) 500 mg in sodium chloride 0.9 % 250 mL IVPB  Status:  Discontinued        500 mg 250 mL/hr over 60 Minutes Intravenous Every 24 hours 12/09/23 0734 12/11/23 1618   12/09/23 2000  cefTRIAXone (ROCEPHIN) 2 g in sodium chloride 0.9 % 100 mL IVPB  Status:  Discontinued        2 g 200 mL/hr over 30 Minutes Intravenous Every 24 hours 12/09/23 0953 12/09/23 1840   12/09/23 2000  piperacillin-tazobactam (ZOSYN) IVPB 3.375 g        3.375 g 12.5 mL/hr over 240 Minutes Intravenous  Every 8 hours 12/09/23 1850 12/12/23 0037   12/09/23 0600  Ampicillin-Sulbactam (UNASYN) 3 g in sodium chloride 0.9 % 100 mL IVPB  Status:  Discontinued        3 g 200 mL/hr over 30 Minutes Intravenous Every 6 hours 12/09/23 0442 12/09/23 0953   12/08/23 2215  cefTRIAXone (ROCEPHIN) 2 g in sodium chloride 0.9 % 100 mL IVPB        2 g 200 mL/hr over 30 Minutes Intravenous Once 12/08/23 2214 12/08/23 2308  12/08/23 2215  azithromycin (ZITHROMAX) 500 mg in sodium chloride 0.9 % 250 mL IVPB        500 mg 250 mL/hr over 60 Minutes Intravenous  Once 12/08/23 2214 12/09/23 0000              Family Communication/Anticipated D/C date and plan/Code Status   DVT prophylaxis: enoxaparin (LOVENOX) injection 40 mg Start: 12/11/23 2200     Code Status: Full Code  Family Communication: Plan discussed with fianc at the bedside Disposition Plan: To be determined   Status is: Inpatient Remains inpatient appropriate because: Awaiting legal guardianship       Subjective:   Interval events noted.  She complains of pain in the right hip.  Her fianc was at the bedside.  Meghan Lack, RN, was also at the bedside.  Objective:    Vitals:   03/04/24 2043 03/05/24 0440 03/05/24 0507 03/05/24 0730  BP: 110/78  103/78 (!) 114/97  Pulse: 88  95 93  Resp: 20  16 18   Temp: 98.2 F (36.8 C)  98.3 F (36.8 C) 98.2 F (36.8 C)  TempSrc: Oral     SpO2: 100%  100% 100%  Weight:  70.9 kg    Height:       No data found.   Intake/Output Summary (Last 24 hours) at 03/05/2024 1157 Last data filed at 03/05/2024 1046 Gross per 24 hour  Intake 720 ml  Output --  Net 720 ml   Filed Weights   03/03/24 0423 03/04/24 0500 03/05/24 0440  Weight: 68.6 kg 72.1 kg 70.9 kg    Exam:  GEN: NAD SKIN: Warm and dry EYES: No pallor or icterus ENT: MMM CV: RRR PULM: CTA B ABD: soft, ND, NT, +BS CNS: AAO x 3, non focal EXT: Right hip and upper lateral thigh tenderness.  Mild swelling noted of the  right upper lateral thigh.  No erythema noted.        Data Reviewed:   I have personally reviewed following labs and imaging studies:  Labs: Labs show the following:   Basic Metabolic Panel: No results for input(s): "NA", "K", "CL", "CO2", "GLUCOSE", "BUN", "CREATININE", "CALCIUM", "MG", "PHOS" in the last 168 hours. GFR Estimated Creatinine Clearance: 85.8 mL/min (by C-G formula based on SCr of 0.67 mg/dL). Liver Function Tests: No results for input(s): "AST", "ALT", "ALKPHOS", "BILITOT", "PROT", "ALBUMIN" in the last 168 hours. No results for input(s): "LIPASE", "AMYLASE" in the last 168 hours. No results for input(s): "AMMONIA" in the last 168 hours. Coagulation profile No results for input(s): "INR", "PROTIME" in the last 168 hours.  CBC: No results for input(s): "WBC", "NEUTROABS", "HGB", "HCT", "MCV", "PLT" in the last 168 hours. Cardiac Enzymes: No results for input(s): "CKTOTAL", "CKMB", "CKMBINDEX", "TROPONINI" in the last 168 hours. BNP (last 3 results) No results for input(s): "PROBNP" in the last 8760 hours. CBG: No results for input(s): "GLUCAP" in the last 168 hours. D-Dimer: No results for input(s): "DDIMER" in the last 72 hours. Hgb A1c: No results for input(s): "HGBA1C" in the last 72 hours. Lipid Profile: No results for input(s): "CHOL", "HDL", "LDLCALC", "TRIG", "CHOLHDL", "LDLDIRECT" in the last 72 hours. Thyroid function studies: No results for input(s): "TSH", "T4TOTAL", "T3FREE", "THYROIDAB" in the last 72 hours.  Invalid input(s): "FREET3" Anemia work up: No results for input(s): "VITAMINB12", "FOLATE", "FERRITIN", "TIBC", "IRON", "RETICCTPCT" in the last 72 hours. Sepsis Labs: No results for input(s): "PROCALCITON", "WBC", "LATICACIDVEN" in the last 168 hours.  Microbiology No results found for this  or any previous visit (from the past 240 hours).  Procedures and diagnostic studies:  No results found.             LOS: 88 days    Meghan Jarvis  [employer] loss adjuster on [email or web address]. If 7PM-7AM, please contact night-coverage at [email or web address]     03/05/2024, 11:57 AM

## 2024-03-06 DIAGNOSIS — B20 Human immunodeficiency virus [HIV] disease: Secondary | ICD-10-CM | POA: Diagnosis not present

## 2024-03-06 DIAGNOSIS — M60851 Other myositis, right thigh: Secondary | ICD-10-CM | POA: Diagnosis not present

## 2024-03-06 DIAGNOSIS — F02818 Dementia in other diseases classified elsewhere, unspecified severity, with other behavioral disturbance: Secondary | ICD-10-CM | POA: Diagnosis not present

## 2024-03-06 NOTE — Plan of Care (Signed)
  Problem: Coping: Goal: Ability to adjust to condition or change in health will improve Outcome: Progressing   Problem: Fluid Volume: Goal: Ability to maintain a balanced intake and output will improve Outcome: Progressing   Problem: Health Behavior/Discharge Planning: Goal: Ability to identify and utilize available resources and services will improve Outcome: Progressing Goal: Ability to manage health-related needs will improve Outcome: Progressing   Problem: Metabolic: Goal: Ability to maintain appropriate glucose levels will improve Outcome: Progressing   Problem: Nutritional: Goal: Maintenance of adequate nutrition will improve Outcome: Progressing Goal: Progress toward achieving an optimal weight will improve Outcome: Progressing   Problem: Skin Integrity: Goal: Risk for impaired skin integrity will decrease Outcome: Progressing   Problem: Tissue Perfusion: Goal: Adequacy of tissue perfusion will improve Outcome: Progressing   Problem: Education: Goal: Knowledge of General Education information will improve Description: Including pain rating scale, medication(s)/side effects and non-pharmacologic comfort measures Outcome: Progressing   Problem: Health Behavior/Discharge Planning: Goal: Ability to manage health-related needs will improve Outcome: Progressing   Problem: Clinical Measurements: Goal: Ability to maintain clinical measurements within normal limits will improve Outcome: Progressing Goal: Will remain free from infection Outcome: Progressing Goal: Diagnostic test results will improve Outcome: Progressing Goal: Respiratory complications will improve Outcome: Progressing Goal: Cardiovascular complication will be avoided Outcome: Progressing   Problem: Activity: Goal: Risk for activity intolerance will decrease Outcome: Progressing   Problem: Nutrition: Goal: Adequate nutrition will be maintained Outcome: Progressing   Problem: Coping: Goal:  Level of anxiety will decrease Outcome: Progressing   Problem: Elimination: Goal: Will not experience complications related to bowel motility Outcome: Progressing Goal: Will not experience complications related to urinary retention Outcome: Progressing   Problem: Pain Management: Goal: General experience of comfort will improve Outcome: Progressing   Problem: Safety: Goal: Ability to remain free from injury will improve Outcome: Progressing   Problem: Skin Integrity: Goal: Risk for impaired skin integrity will decrease Outcome: Progressing   Problem: Activity: Goal: Ability to tolerate increased activity will improve Outcome: Progressing   Problem: Respiratory: Goal: Ability to maintain a clear airway and adequate ventilation will improve Outcome: Progressing

## 2024-03-06 NOTE — Consult Note (Signed)
 ORTHOPAEDIC CONSULTATION  REQUESTING PHYSICIAN: Lurene Shadow, MD  ASSESSMENT AND PLAN: 48 y.o. 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  PT with WBAT.  Consider NSAIDs for pain control if tolerated.  Upon my initial evaluation, patient was lying comfortably on her right side.  She is not complaining of pain.  However, upon physical exam, she started to complain of pain.  There does not appear to be any redness.  No areas of fluctuance.  No specific point tenderness.  Continue with symptomatic treatment.  - Weight Bearing Status/Activity: Weightbearing as tolerated  - Additional recommended labs/tests: None  -VTE Prophylaxis: As needed.  No orthopedic contraindications.  - Pain control: As needed.  NSAIDs can be effective.  - Follow-up plan: As needed  -Procedures: None planned  Chief Complaint: Right hip pain  HPI: Meghan Jarvis is a 48 y.o. female with past medical history as listed below.  She has been admitted for several months.  She states that for the past month or so, she started to have pain in the lateral right hip.  Pain is also within the buttock.  No specific injury.  She denies fevers or chills.  She states that her pain is not being managed.  No prior issues with the right hip.  Past Medical History:  Diagnosis Date   Asthma    Depression    HIV (human immunodeficiency virus infection) (HCC)    Past Surgical History:  Procedure Laterality Date   ABDOMINAL HYSTERECTOMY     APPENDECTOMY     Social History   Socioeconomic History   Marital status: Divorced    Spouse name: Not on file   Number of children: Not on file   Years of education: Not on file   Highest education level: Not on file  Occupational History   Not on file  Tobacco Use   Smoking status: Every Day    Current packs/day: 1.00    Types: Cigarettes   Smokeless tobacco: Never  Substance and Sexual Activity    Alcohol use: Yes   Drug use: Yes    Types: Cocaine   Sexual activity: Yes    Birth control/protection: Surgical  Other Topics Concern   Not on file  Social History Narrative   Not on file   Social Drivers of Health   Financial Resource Strain: Not on file  Food Insecurity: Patient Unable To Answer (12/09/2023)   Hunger Vital Sign    Worried About Running Out of Food in the Last Year: Patient unable to answer    Ran Out of Food in the Last Year: Patient unable to answer  Recent Concern: Food Insecurity - Food Insecurity Present (09/26/2023)   Hunger Vital Sign    Worried About Running Out of Food in the Last Year: Often true    Ran Out of Food in the Last Year: Sometimes true  Transportation Needs: Patient Unable To Answer (12/09/2023)   PRAPARE - Transportation    Lack of Transportation (Medical): Patient unable to answer    Lack of Transportation (Non-Medical): Patient unable to answer  Recent Concern: Transportation Needs - Unmet Transportation Needs (10/11/2023)   PRAPARE - Administrator, Civil Service (Medical): Yes    Lack of Transportation (Non-Medical): Patient unable to answer  Physical Activity: Not on file  Stress: Not on file  Social Connections: Not on file   Family History  Family history unknown: Yes  Allergies  Allergen Reactions   Fish Allergy Other (See Comments)    Crab legs result in itching  Crab legs result in itching  Crab legs result in itching   Shellfish Allergy Other (See Comments)    Crab legs result in itching   Buprenorphine Hcl     Pt states she not allergic   Morphine And Codeine Itching    hives   Sulfa Antibiotics Rash   Prior to Admission medications   Medication Sig Start Date End Date Taking? Authorizing Provider  azithromycin (ZITHROMAX) 600 MG tablet Take 2 tablets (1,200 mg total) by mouth once a week. 01/25/24  Yes Loyce Dys, MD  atovaquone [facility]) 750 MG/5ML suspension Take 10 mLs (1,500 mg total) by  mouth daily with breakfast. 01/25/24   Loyce Dys, MD  bictegravir-emtricitabine-tenofovir AF (BIKTARVY) 50-200-25 MG TABS tablet Take 1 tablet by mouth daily. 01/25/24   Loyce Dys, MD  docusate sodium (COLACE) 100 MG capsule Take 1 capsule (100 mg total) by mouth 2 (two) times daily. 01/25/24   Loyce Dys, MD  folic acid (FOLVITE) 1 MG tablet Take 1 tablet (1 mg total) by mouth daily. 01/25/24   Loyce Dys, MD  midodrine (PROAMATINE) 10 MG tablet Take 1 tablet (10 mg total) by mouth 3 (three) times daily with meals. 01/25/24   Loyce Dys, MD  pantoprazole (PROTONIX) 40 MG tablet Take 1 tablet (40 mg total) by mouth 2 (two) times daily. 01/25/24   Loyce Dys, MD  thiamine (VITAMIN B-1) 100 MG tablet Take 1 tablet (100 mg total) by mouth daily. 01/26/24   Loyce Dys, MD   No results found. Family History Reviewed and non-contributory, no pertinent history of problems with bleeding or anesthesia    Review of Systems No fevers or chills No numbness or tingling No chest pain No shortness of breath No bowel or bladder dysfunction No GI distress No headaches    OBJECTIVE  Vitals:Patient Vitals for the past 8 hrs:  BP Temp Pulse Resp SpO2  03/06/24 0941 91/69 98.1 F (36.7 C) 100 18 100 %   General: Alert, no acute distress Cardiovascular: Warm extremities noted Respiratory: No cyanosis, no use of accessory musculature GI: No organomegaly, abdomen is soft and non-tender Skin: No lesions in the area of chief complaint other than those listed below in MSK exam.  Neurologic: Sensation intact distally save for the below mentioned MSK exam Psychiatric: Patient is competent for consent with normal mood and affect Lymphatic: No swelling obvious and reported other than the area involved in the exam below Extremities   RLE: Evaluation of the right hip demonstrates no deformity.  No redness.  No fluctuance is appreciated.  She does have some tenderness to palpation  within the lateral hip, as well as the right buttock.  She is able to maintain a straight leg raise.  Upon entry into the room, she was laying directly on her right hip, and appeared to be very comfortable.  Sensation is intact distally.    Test Results Imaging MRI right hip  IMPRESSION: 1. Intramuscular edema and enhancement within the right gluteus medius and minimus muscles with a central area of non-enhancement in the posterior right gluteus minimus muscle adjacent to the posterior acetabular rim. Similar findings are present at the lateral aspect of the right gluteus maximus muscle adjacent to the greater trochanter. 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 No fluid signal at these locations  to suggest abscess formation. 2. No acute osseous abnormality of the pelvis or right hip. No evidence of septic arthritis or osteomyelitis. 3. Moderate osteoarthritis of the right hip. 4. Mildly enlarged bilateral inguinal and external iliac chain lymph nodes, likely reactive.  Labs cbc Recent Labs    03/05/24 1246  WBC 4.1  HGB 9.6*  HCT 29.7*  PLT 316     Recent Labs    03/05/24 1246  NA 139  K 4.1  CL 109  CO2 24  GLUCOSE 94  BUN 13  CREATININE 0.72  CALCIUM 9.1

## 2024-03-06 NOTE — Progress Notes (Signed)
 This nurse manager talked with patient regarding the incident that occurred on 03/05/24. The patient was agreeable to discuss the situation.  Nurse manager educated patient on hospital policy regarding no alcohol or drugs are allowed on the premises and visitors are not permitted to bring such named substances to this hospital.  The patient continued to say "I have not done any drugs, I'm clean"  Nurse manager reminded patient of the concern the remotely monitored telesitter saw and heard while watching the patient. Nurse manager also informed patient that due to her visitor bringing her unknown substances that he is not permitted to visit while she is an inpatient.  Patient verbalized understanding and had no further questions.  Telesitter monitor remains at bedside.
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
## 2024-03-07 DIAGNOSIS — R419 Unspecified symptoms and signs involving cognitive functions and awareness: Secondary | ICD-10-CM

## 2024-03-07 DIAGNOSIS — M60851 Other myositis, right thigh: Secondary | ICD-10-CM

## 2024-03-07 DIAGNOSIS — F02818 Dementia in other diseases classified elsewhere, unspecified severity, with other behavioral disturbance: Secondary | ICD-10-CM | POA: Diagnosis not present

## 2024-03-07 DIAGNOSIS — B20 Human immunodeficiency virus [HIV] disease: Secondary | ICD-10-CM | POA: Diagnosis not present

## 2024-03-07 DIAGNOSIS — A480 Gas gangrene: Secondary | ICD-10-CM

## 2024-03-07 MED ORDER — IBUPROFEN 400 MG PO TABS
400.0000 mg | ORAL_TABLET | Freq: Four times a day (QID) | ORAL | Status: DC | PRN
  Administered 2024-03-07 – 2024-05-07 (×43): 400 mg via ORAL
  Filled 2024-03-07 (×43): qty 1

## 2024-03-07 MED ORDER — ACETAMINOPHEN 325 MG PO TABS
650.0000 mg | ORAL_TABLET | Freq: Four times a day (QID) | ORAL | Status: DC | PRN
  Administered 2024-03-09 – 2024-05-06 (×58): 650 mg via ORAL
  Filled 2024-03-07 (×60): qty 2

## 2024-03-07 NOTE — Plan of Care (Signed)
  Problem: Coping: Goal: Ability to adjust to condition or change in health will improve Outcome: Progressing   Problem: Fluid Volume: Goal: Ability to maintain a balanced intake and output will improve Outcome: Progressing   Problem: Skin Integrity: Goal: Risk for impaired skin integrity will decrease Outcome: Progressing   Problem: Activity: Goal: Risk for activity intolerance will decrease Outcome: Progressing
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
## 2024-03-07 NOTE — Plan of Care (Signed)
   Problem: Fluid Volume: Goal: Ability to maintain a balanced intake and output will improve Outcome: Progressing   Problem: Metabolic: Goal: Ability to maintain appropriate glucose levels will improve Outcome: Progressing   Problem: Nutritional: Goal: Maintenance of adequate nutrition will improve Outcome: Progressing Goal: Progress toward achieving an optimal weight will improve Outcome: Progressing   Problem: Skin Integrity: Goal: Risk for impaired skin integrity will decrease Outcome: Progressing   Problem: Tissue Perfusion: Goal: Adequacy of tissue perfusion will improve Outcome: Progressing

## 2024-03-07 NOTE — Progress Notes (Signed)
 Progress Note    Meghan Jarvis  BJY:[id number] DOB: 1976-10-14  DOA: 12/08/2023 [name]      Brief Narrative:    Medical records reviewed and are as summarized below:  Meghan Jarvis is a 48 y.o. female with polysubstance use disorder (ETOH, cocaine), atrial fibrillation,, homelessness, AIDS, hypothyroidism, and DM who presented on 12/08/23 with acute respiratory failure.  She was found down by a friend and could not wake her up.  She was found to be hypoxic in the 70s. Admitted to ICU, intubated, started on antibiotics for pneumonia.  12/31: extubated. ID consulted.  MRI brain with advanced cerebral atrophy.  Psych evaluated - "Patient is lacking and reasoning in her medical care for example she is unable to describe the consequence that she will have if she does not take her medications or has complications with her chronic conditions" and needs assistance with housing and finances due to cognitive decline.  APS/DSS pending guardianship and will need placement.        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   HIV Associated Neurocognitive Disorder HIV w/ progression to AIDS  Mental status improved from admission but patient is a poor historian and does not have capacity to make decisions at this time per psychiatry DSS referral by [facility] MRI did not show any enhancing lesions but did show severe cerebral atrophy - likely HIV associated neurocognitive disorder Viral  load 2 million, CD4 14 on admission ID recommended Biktarvy ( 50-200-25 mg) and azithromycin 1200mg  weekly for MAI prophylaxis and Atovaquone for PCP and toxo prophylaxis at discharge. Toxoplasma antibody IgG came back elevated but MRI of the brain does not show any enhancing lesions Repeat viral load down to 34,300    Right hip pain. Right lower quadrant abdominal pain. MRI right hip showed intramuscular edema enhancement within the right gluteus medius and minimus muscles and lateral aspect of right gluteus maximus muscle.  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  There is also evidence of moderate osteoarthritis of the right hip. He was evaluated by Dr. Dallas Schimke, orthopedic surgeon, on 03/06/2024.  Conservative management with analgesics and PT recommended.    Diarrhea Improved    Hypotension BP stable.  Continue midodrine     Diet Order             DIET DYS 3 Room service appropriate? Yes; Fluid consistency: Thin  Diet effective now                            Consultants: ID specialist Orthopedic surgery  Procedures: None    Medications:    atovaquone  1,500 mg Oral Q breakfast   azithromycin  1,200 mg Oral Weekly   bictegravir-emtricitabine-tenofovir AF  1 tablet Oral Daily   DULoxetine  30 mg Oral QHS   enoxaparin (LOVENOX) injection  40 mg Subcutaneous QHS   folic acid  1 mg Oral Daily   hydrocerin   Topical BID  lidocaine  1 patch Transdermal Q24H   lipase/protease/amylase  24,000 Units Oral TID AC   midodrine  5 mg Oral TID WC   multivitamin with minerals  1 tablet Oral QHS   pantoprazole  40 mg Oral BID   senna-docusate  2 tablet Oral BID   thiamine  100 mg Oral Daily   Continuous Infusions:   Anti-infectives (From admission, onward)    Start     Dose/Rate Route Frequency Ordered Stop   01/25/24 0000  azithromycin (ZITHROMAX) 600 MG tablet        1,200 mg Oral Weekly 01/25/24 1610     01/25/24 0000   bictegravir-emtricitabine-tenofovir AF (BIKTARVY) 50-200-25 MG TABS tablet        1 tablet Oral Daily 01/25/24 1610     01/25/24 0000  atovaquone (MEPRON) 750 MG/5ML suspension        1,500 mg Oral Daily with breakfast 01/25/24 1610     12/25/23 0800  atovaquone (MEPRON) 750 MG/5ML suspension 1,500 mg        1,500 mg Oral Daily with breakfast 12/24/23 1558     12/24/23 1000  sulfamethoxazole-trimethoprim (BACTRIM) 400-80 MG per tablet 1 tablet  Status:  Discontinued        1 tablet Oral Daily 12/24/23 0749 12/24/23 1557   12/19/23 1400  azithromycin (ZITHROMAX) tablet 1,200 mg        1,200 mg Oral Weekly 12/18/23 1346     12/18/23 1000  sulfamethoxazole-trimethoprim (BACTRIM DS) 800-160 MG per tablet 1 tablet  Status:  Discontinued        1 tablet Oral Daily 12/17/23 1621 12/24/23 0748   12/14/23 1530  bictegravir-emtricitabine-tenofovir AF (BIKTARVY) 50-200-25 MG per tablet 1 tablet        1 tablet Oral Daily 12/14/23 1442     12/12/23 1000  amoxicillin-clavulanate (AUGMENTIN) 875-125 MG per tablet 1 tablet        1 tablet Oral Every 12 hours 12/11/23 1345 12/12/23 2131   12/12/23 1000  azithromycin (ZITHROMAX) tablet 1,250 mg  Status:  Discontinued        1,200 mg Oral Weekly 12/11/23 1618 12/18/23 1346   12/11/23 2000  sulfamethoxazole-trimethoprim (BACTRIM) 400-80 MG per tablet 1 tablet  Status:  Discontinued        1 tablet Oral Daily 12/11/23 1618 12/17/23 1621   12/09/23 2200  azithromycin (ZITHROMAX) 500 mg in sodium chloride 0.9 % 250 mL IVPB  Status:  Discontinued        500 mg 250 mL/hr over 60 Minutes Intravenous Every 24 hours 12/09/23 0734 12/11/23 1618   12/09/23 2000  cefTRIAXone (ROCEPHIN) 2 g in sodium chloride 0.9 % 100 mL IVPB  Status:  Discontinued        2 g 200 mL/hr over 30 Minutes Intravenous Every 24 hours 12/09/23 0953 12/09/23 1840   12/09/23 2000  piperacillin-tazobactam (ZOSYN) IVPB 3.375 g        3.375 g 12.5 mL/hr over 240 Minutes Intravenous Every 8 hours  12/09/23 1850 12/12/23 0037   12/09/23 0600  Ampicillin-Sulbactam (UNASYN) 3 g in sodium chloride 0.9 % 100 mL IVPB  Status:  Discontinued        3 g 200 mL/hr over 30 Minutes Intravenous Every 6 hours 12/09/23 0442 12/09/23 0953   12/08/23 2215  cefTRIAXone (ROCEPHIN) 2 g in sodium chloride 0.9 % 100 mL IVPB        2 g 200 mL/hr over 30 Minutes Intravenous Once 12/08/23 2214 12/08/23 2308  12/08/23 2215  azithromycin (ZITHROMAX) 500 mg in sodium chloride 0.9 % 250 mL IVPB        500 mg 250 mL/hr over 60 Minutes Intravenous  Once 12/08/23 2214 12/09/23 0000              Family Communication/Anticipated D/C date and plan/Code Status   DVT prophylaxis: enoxaparin (LOVENOX) injection 40 mg Start: 12/11/23 2200     Code Status: Full Code  Family Communication: None Disposition Plan: To be determined   Status is: Inpatient Remains inpatient appropriate because: Awaiting legal guardianship       Subjective:   Interval events noted.  No complaints.  Objective:    Vitals:   03/07/24 0338 03/07/24 0345 03/07/24 0842 03/07/24 1526  BP: 108/76  106/79 102/69  Pulse: 88  (!) 103 100  Resp: 20  16 (!) 21  Temp: 98.1 F (36.7 C)  98.3 F (36.8 C) 98.8 F (37.1 C)  TempSrc:   Oral Oral  SpO2: 100%  99% 99%  Weight:  72.9 kg    Height:       No data found.   Intake/Output Summary (Last 24 hours) at 03/07/2024 1626 Last data filed at 03/07/2024 1058 Gross per 24 hour  Intake 240 ml  Output --  Net 240 ml   Filed Weights   03/05/24 0440 03/06/24 0400 03/07/24 0345  Weight: 70.9 kg 71.3 kg 72.9 kg    Exam:  GEN: NAD SKIN: Warm and dry EYES: No pallor or icterus ENT: MMM CV: RRR PULM: CTA B ABD: soft, ND, NT, +BS CNS: AAO x 3, non focal EXT: Mild right hip tenderness    Data Reviewed:   I have personally reviewed following labs and imaging studies:  Labs: Labs show the following:   Basic Metabolic Panel: Recent Labs  Lab 03/05/24 1246   NA 139  K 4.1  CL 109  CO2 24  GLUCOSE 94  BUN 13  CREATININE 0.72  CALCIUM 9.1   GFR Estimated Creatinine Clearance: 85.8 mL/min (by C-G formula based on SCr of 0.72 mg/dL). Liver Function Tests: Recent Labs  Lab 03/05/24 1246  AST 33  ALT 33  ALKPHOS 59  BILITOT 0.4  PROT 7.5  ALBUMIN 3.2*   No results for input(s): "LIPASE", "AMYLASE" in the last 168 hours. No results for input(s): "AMMONIA" in the last 168 hours. Coagulation profile No results for input(s): "INR", "PROTIME" in the last 168 hours.  CBC: Recent Labs  Lab 03/05/24 1246  WBC 4.1  NEUTROABS 1.5*  HGB 9.6*  HCT 29.7*  MCV 84.9  PLT 316   Cardiac Enzymes: No results for input(s): "CKTOTAL", "CKMB", "CKMBINDEX", "TROPONINI" in the last 168 hours. BNP (last 3 results) No results for input(s): "PROBNP" in the last 8760 hours. CBG: No results for input(s): "GLUCAP" in the last 168 hours. D-Dimer: No results for input(s): "DDIMER" in the last 72 hours. Hgb A1c: No results for input(s): "HGBA1C" in the last 72 hours. Lipid Profile: No results for input(s): "CHOL", "HDL", "LDLCALC", "TRIG", "CHOLHDL", "LDLDIRECT" in the last 72 hours. Thyroid function studies: No results for input(s): "TSH", "T4TOTAL", "T3FREE", "THYROIDAB" in the last 72 hours.  Invalid input(s): "FREET3" Anemia work up: No results for input(s): "VITAMINB12", "FOLATE", "FERRITIN", "TIBC", "IRON", "RETICCTPCT" in the last 72 hours. Sepsis Labs: Recent Labs  Lab 03/05/24 1246  WBC 4.1    Microbiology No results found for this or any previous visit (from the past 240 hours).  Procedures and diagnostic  studies:  No results found.             LOS: 90 days   Anish Vana  [employer] loss adjuster on [email or web address]. If 7PM-7AM, please contact night-coverage at [email or web address]     03/07/2024, 4:26 PM

## 2024-03-07 NOTE — Progress Notes (Addendum)
 Date of Admission:  12/08/2023     ID: Meghan Jarvis is a 48 y.o. 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 Meghan Jarvis is a 48 y.o. with a history of AIDS, not compliant with meds or visits ,  , cocaine use was brought in by EMS after being found unresponsive on the couch  in an aquaintance place-   Subjective: C/o rt hip pain Rt thigh pain  Medications:   atovaquone  1,500 mg Oral Q breakfast   azithromycin  1,200 mg Oral Weekly   bictegravir-emtricitabine-tenofovir AF  1 tablet Oral Daily   DULoxetine  30 mg Oral QHS   enoxaparin (LOVENOX) injection  40 mg Subcutaneous QHS   folic acid  1 mg Oral Daily   hydrocerin   Topical BID   lidocaine  1 patch Transdermal Q24H   lipase/protease/amylase  24,000 Units Oral TID AC   midodrine  5 mg Oral TID WC   multivitamin with minerals  1 tablet Oral QHS   pantoprazole  40 mg Oral BID   senna-docusate  2 tablet Oral BID   thiamine  100 mg Oral Daily    Objective: Vital signs in last 24 hours: Patient Vitals for the past 24 hrs:  BP Temp Temp src Pulse Resp SpO2 Weight  03/07/24 1526 102/69 98.8 F (37.1 C) Oral 100 (!) 21 99 % --  03/07/24 0842 106/79 98.3 F (36.8 C) Oral (!) 103 16 99 % --  03/07/24 0345 -- -- -- -- -- -- 72.9 kg  03/07/24 0338 108/76 98.1 F (36.7 C) -- 88 20 100 % --  03/06/24 2112 103/81 97.9 F (36.6 C) -- 99 20 100 % --  03/06/24 1807 105/72 98 F (36.7 C) -- 91 16 100 % --  Awake and alert Hss1s2 Lungs clear to auscultation Abdomen soft Skin- hyperpigmentation, dryness, scaling has improved significantly. CNS grossly non focal  Rt thigh hip area slightly swollen Movt painful  Lab Results    Latest Ref Rng & Units 03/05/2024   12:46 PM 02/27/2024     5:57 AM 02/20/2024    5:57 AM  CBC  WBC 4.0 - 10.5 K/uL 4.1  5.5  4.9   Hemoglobin 12.0 - 15.0 g/dL 9.6  9.6  41.3   Hematocrit 36.0 - 46.0 % 29.7  29.7  31.4   Platelets 150 - 400 K/uL 316  311  254        Latest Ref Rng & Units 03/05/2024   12:46 PM 02/27/2024    5:57 AM 02/20/2024    5:57 AM  CMP  Glucose 70 - 99 mg/dL 94  86  96   BUN 6 - 20 mg/dL 13  13  12    Creatinine 0.44 - 1.00 mg/dL 2.44  0.10  2.72   Sodium 135 - 145 mmol/L 139  139  136   Potassium 3.5 - 5.1 mmol/L 4.1  3.7  3.6   Chloride 98 - 111 mmol/L 109  113  108   CO2 22 - 32 mmol/L 24  23  20    Calcium 8.9 - 10.3 mg/dL 9.1  8.8  9.3   Total Protein 6.5 - 8.1 g/dL 7.5     Total Bilirubin 0.0 -  1.2 mg/dL 0.4     Alkaline Phos 38 - 126 U/L 59     AST 15 - 41 U/L 33     ALT 0 - 44 U/L 33         Microbiology: 12/28 BC NG  Toxo IgG > 400 Toxo PCR neg Toxo igM neg RPR NR CMV DNA neg Crypto neg HIV RNA 2 million>> 34, 000 Cd4 is 14 ( 2.4%) repeat on 1/29 is 416 ( 23%) Beta D glucan > 500 (  repeated) Histoplasma neg Fungal antibodies negative Genosure prime- no resistant mutations QuantiFERON gold indeterminate.  No treatment needed now.  Can repeat it later.  Radiology MRI hip rt done on 02/25/24 Reviewed personally     Intramuscular edema and enhancement within the right gluteus medius and minimus muscles with a central area of non-enhancement in the posterior right gluteus minimus muscle adjacent to the posterior acetabular rim. Similar findings are present at the lateral aspect of the right gluteus maximus muscle adjacent to the greater trochanter. 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 No fluid signal at these locations to suggest abscess formation.   Assessment/Plan:  Persistent Rt thigh, rt hip pain MRI shows myositis , myonecrosis of the gluteus medius , minimus  What is the cause Could it be related to Toxo ( has IgG >400 Will repeat toxo  Serology and beta d glucan which was elevated  She was assesed by ortho and no concern Pt may need IR guided muscle biopsy   Encephalopathy  has resolved ( Likely related to cocaine use on presentation)  Aspiration leading to acute hypoxic respiratory failure with right middle lobe and lower lobe consolidation.  Status post intubation and was mechanically ventilated in the early part of the admission.  Now has been extubated for many days Took treatment with Zosyn completed treatment repeat chest x-ray is clear      Rt sided  flank, chest pain-intermittent-- has resolved  CT abd ok- MRI lumbar spine deg changes- no acute findings other than degeneration    Rash- resolved likely was due to bactrim- changed to Atovaquone- Mepron   H/o fall with recurrent ED presentation-      Pruritus with excoriations and hyperpigmentiaon resolved ? Bactrim related rash on basleine dry and excoriated skin    AIDS- non compliant with meds or visits to her Provider- not been in care since 2021- used to be followed at [facility] before Was on Biktarvy at one time and was non compliant then VL now is 2 million and cd4 is 11  Started Biktarvy on 12/14/23 . not sure how much compliance to be expected as her social situation is not conducive and so is her substance use- watch closely for Immune reconstitution inflammatory syndrome Genotype no resistance to NRTI, NNRTI, Integrase inhibitor and PI Repeat Vl shows a significant drop in 3 weeks from 2 million to 34,000 . Latest vl is 60 and CD4 is 301 ( 18.8%) from 02/14/24  RISK for IRIS- watching  closely No evidence of IRIS   Toxo IgG very high-  MRI brain no CNS lesions  . TOXO PCR negative On  PCP/TOXO  and MAI prophylaxis. Was On bactrim for PCP and Toxo prophylaxis- because of new rash concerning for bactrim allergy was changed to atovaquone Rash resolved  Beta D glucan high > 500 (fungal antibodies negative Unclear why itis high Will repeat    Active  cocaine use  ETOH abuse  Failure to  thrive- combination of drug use and AIDS Anemia   leucopenia/thrombocytopenia could be due to AIDS ETOH abuse   resolved  CMV DNA neg , AFB blood culture sent-      HIV associated neurocognitive disorder compounded by substance use with no insight in her medical condition     She has no capacity , and waiting for placement- DSS involved   On discharge she will have to go on Biktarvy ( 50-200-25 mg) and azithromycin 1200mg  weekly for MAI prophylaxis and Atovaquone for PCP and toxo prophylaxis  Discussed the management with patient .  ID will not see her this weekend- call if needed

## 2024-03-08 DIAGNOSIS — F02818 Dementia in other diseases classified elsewhere, unspecified severity, with other behavioral disturbance: Secondary | ICD-10-CM | POA: Diagnosis not present

## 2024-03-08 DIAGNOSIS — B20 Human immunodeficiency virus [HIV] disease: Secondary | ICD-10-CM | POA: Diagnosis not present

## 2024-03-08 LAB — CK: Total CK: 90 U/L (ref 38–234)

## 2024-03-08 NOTE — Plan of Care (Signed)
  Problem: Coping: Goal: Ability to adjust to condition or change in health will improve Outcome: Progressing   Problem: Fluid Volume: Goal: Ability to maintain a balanced intake and output will improve Outcome: Progressing   Problem: Health Behavior/Discharge Planning: Goal: Ability to identify and utilize available resources and services will improve Outcome: Progressing Goal: Ability to manage health-related needs will improve Outcome: Progressing   Problem: Metabolic: Goal: Ability to maintain appropriate glucose levels will improve Outcome: Progressing   Problem: Nutritional: Goal: Maintenance of adequate nutrition will improve Outcome: Progressing Goal: Progress toward achieving an optimal weight will improve Outcome: Progressing   Problem: Skin Integrity: Goal: Risk for impaired skin integrity will decrease Outcome: Progressing   Problem: Tissue Perfusion: Goal: Adequacy of tissue perfusion will improve Outcome: Progressing   Problem: Education: Goal: Knowledge of General Education information will improve Description: Including pain rating scale, medication(s)/side effects and non-pharmacologic comfort measures Outcome: Progressing   Problem: Health Behavior/Discharge Planning: Goal: Ability to manage health-related needs will improve Outcome: Progressing   Problem: Clinical Measurements: Goal: Ability to maintain clinical measurements within normal limits will improve Outcome: Progressing Goal: Will remain free from infection Outcome: Progressing Goal: Diagnostic test results will improve Outcome: Progressing Goal: Respiratory complications will improve Outcome: Progressing Goal: Cardiovascular complication will be avoided Outcome: Progressing   Problem: Activity: Goal: Risk for activity intolerance will decrease Outcome: Progressing   Problem: Nutrition: Goal: Adequate nutrition will be maintained Outcome: Progressing   Problem: Coping: Goal:  Level of anxiety will decrease Outcome: Progressing   Problem: Elimination: Goal: Will not experience complications related to bowel motility Outcome: Progressing Goal: Will not experience complications related to urinary retention Outcome: Progressing   Problem: Pain Management: Goal: General experience of comfort will improve Outcome: Progressing   Problem: Safety: Goal: Ability to remain free from injury will improve Outcome: Progressing   Problem: Skin Integrity: Goal: Risk for impaired skin integrity will decrease Outcome: Progressing   Problem: Activity: Goal: Ability to tolerate increased activity will improve Outcome: Progressing   Problem: Respiratory: Goal: Ability to maintain a clear airway and adequate ventilation will improve Outcome: Progressing

## 2024-03-08 NOTE — Progress Notes (Signed)
 Progress Note    Meghan Jarvis  ZOX:[id number] DOB: [birth year]  DOA: 12/08/2023 [name]      Brief Narrative:    Medical records reviewed and are as summarized below:  Meghan Jarvis is a 48 y.o. female with polysubstance use disorder (ETOH, cocaine), atrial fibrillation,, homelessness, AIDS, hypothyroidism, and DM who presented on 12/08/23 with acute respiratory failure.  She was found down by a friend and could not wake her up.  She was found to be hypoxic in the 70s. Admitted to ICU, intubated, started on antibiotics for pneumonia.  12/31: extubated. ID consulted.  MRI brain with advanced cerebral atrophy.  Psych evaluated - "Patient is lacking and reasoning in her medical care for example she is unable to describe the consequence that she will have if she does not take her medications or has complications with her chronic conditions" and needs assistance with housing and finances due to cognitive decline.  APS/DSS pending guardianship and will need placement.        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   HIV Associated Neurocognitive Disorder HIV w/ progression to AIDS  Mental status improved from admission but patient is a poor historian and does not have capacity to make decisions at this time per psychiatry DSS referral by [facility] MRI did not show any enhancing lesions but did show severe cerebral atrophy - likely HIV associated neurocognitive disorder Viral  load 2 million, CD4 14 on admission ID recommended Biktarvy ( 50-200-25 mg) and azithromycin 1200mg  weekly for MAI prophylaxis and Atovaquone for PCP and toxo prophylaxis at discharge. Toxoplasma antibody IgG came back elevated but MRI of the brain does not show any enhancing lesions Repeat viral load down to 34,300    Right hip pain. Right lower quadrant abdominal pain. MRI right hip (on 02/25/2024) showed intramuscular edema enhancement within the right gluteus medius and minimus muscles and lateral aspect of right gluteus maximus muscle.  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  There is also evidence of moderate osteoarthritis of the right hip. He was evaluated by Dr. Dallas Schimke, orthopedic surgeon, on 03/06/2024.  Conservative management with analgesics and PT recommended.    Diarrhea Improved    Hypotension BP is okay.  Continue midodrine     Diet Order             DIET DYS 3 Room service appropriate? Yes; Fluid consistency: Thin  Diet effective now                            Consultants: ID specialist Orthopedic surgery  Procedures: None    Medications:    atovaquone  1,500 mg Oral Q breakfast   azithromycin  1,200 mg Oral Weekly   bictegravir-emtricitabine-tenofovir AF  1 tablet Oral Daily   DULoxetine  30 mg Oral QHS   enoxaparin (LOVENOX) injection  40 mg Subcutaneous QHS   folic acid  1 mg Oral Daily   hydrocerin  Topical BID   lidocaine  1 patch Transdermal Q24H   lipase/protease/amylase  24,000 Units Oral TID AC   midodrine  5 mg Oral TID WC   multivitamin with minerals  1 tablet Oral QHS   pantoprazole  40 mg Oral BID   senna-docusate  2 tablet Oral BID   thiamine  100 mg Oral Daily   Continuous Infusions:   Anti-infectives (From admission, onward)    Start     Dose/Rate Route Frequency Ordered Stop   01/25/24 0000  azithromycin (ZITHROMAX) 600 MG tablet        1,200 mg Oral Weekly 01/25/24 1610      01/25/24 0000  bictegravir-emtricitabine-tenofovir AF (BIKTARVY) 50-200-25 MG TABS tablet        1 tablet Oral Daily 01/25/24 1610     01/25/24 0000  atovaquone (MEPRON) 750 MG/5ML suspension        1,500 mg Oral Daily with breakfast 01/25/24 1610     12/25/23 0800  atovaquone (MEPRON) 750 MG/5ML suspension 1,500 mg        1,500 mg Oral Daily with breakfast 12/24/23 1558     12/24/23 1000  sulfamethoxazole-trimethoprim (BACTRIM) 400-80 MG per tablet 1 tablet  Status:  Discontinued        1 tablet Oral Daily 12/24/23 0749 12/24/23 1557   12/19/23 1400  azithromycin (ZITHROMAX) tablet 1,200 mg        1,200 mg Oral Weekly 12/18/23 1346     12/18/23 1000  sulfamethoxazole-trimethoprim (BACTRIM DS) 800-160 MG per tablet 1 tablet  Status:  Discontinued        1 tablet Oral Daily 12/17/23 1621 12/24/23 0748   12/14/23 1530  bictegravir-emtricitabine-tenofovir AF (BIKTARVY) 50-200-25 MG per tablet 1 tablet        1 tablet Oral Daily 12/14/23 1442     12/12/23 1000  amoxicillin-clavulanate (AUGMENTIN) 875-125 MG per tablet 1 tablet        1 tablet Oral Every 12 hours 12/11/23 1345 12/12/23 2131   12/12/23 1000  azithromycin (ZITHROMAX) tablet 1,250 mg  Status:  Discontinued        1,200 mg Oral Weekly 12/11/23 1618 12/18/23 1346   12/11/23 2000  sulfamethoxazole-trimethoprim (BACTRIM) 400-80 MG per tablet 1 tablet  Status:  Discontinued        1 tablet Oral Daily 12/11/23 1618 12/17/23 1621   12/09/23 2200  azithromycin (ZITHROMAX) 500 mg in sodium chloride 0.9 % 250 mL IVPB  Status:  Discontinued        500 mg 250 mL/hr over 60 Minutes Intravenous Every 24 hours 12/09/23 0734 12/11/23 1618   12/09/23 2000  cefTRIAXone (ROCEPHIN) 2 g in sodium chloride 0.9 % 100 mL IVPB  Status:  Discontinued        2 g 200 mL/hr over 30 Minutes Intravenous Every 24 hours 12/09/23 0953 12/09/23 1840   12/09/23 2000  piperacillin-tazobactam (ZOSYN) IVPB 3.375 g        3.375 g 12.5 mL/hr over 240 Minutes Intravenous  Every 8 hours 12/09/23 1850 12/12/23 0037   12/09/23 0600  Ampicillin-Sulbactam (UNASYN) 3 g in sodium chloride 0.9 % 100 mL IVPB  Status:  Discontinued        3 g 200 mL/hr over 30 Minutes Intravenous Every 6 hours 12/09/23 0442 12/09/23 0953   12/08/23 2215  cefTRIAXone (ROCEPHIN) 2 g in sodium chloride 0.9 % 100 mL IVPB        2 g 200 mL/hr over 30 Minutes Intravenous Once 12/08/23  2214 12/08/23 2308   12/08/23 2215  azithromycin (ZITHROMAX) 500 mg in sodium chloride 0.9 % 250 mL IVPB        500 mg 250 mL/hr over 60 Minutes Intravenous  Once 12/08/23 2214 12/09/23 0000              Family Communication/Anticipated D/C date and plan/Code Status   DVT prophylaxis: enoxaparin (LOVENOX) injection 40 mg Start: 12/11/23 2200     Code Status: Full Code  Family Communication: None Disposition Plan: To be determined   Status is: Inpatient Remains inpatient appropriate because: Awaiting legal guardianship       Subjective:   Interval events noted.  She has no complaints.  She feels better.  Objective:    Vitals:   03/07/24 1940 03/08/24 0402 03/08/24 0500 03/08/24 0838  BP: 105/74 106/75  105/68  Pulse: 90 89  99  Resp:    14  Temp: 98.3 F (36.8 C) 97.9 F (36.6 C)  98.8 F (37.1 C)  TempSrc: Oral     SpO2: 100% 100%  100%  Weight:   72.9 kg   Height:       No data found.   Intake/Output Summary (Last 24 hours) at 03/08/2024 1504 Last data filed at 03/08/2024 1100 Gross per 24 hour  Intake 120 ml  Output --  Net 120 ml   Filed Weights   03/06/24 0400 03/07/24 0345 03/08/24 0500  Weight: 71.3 kg 72.9 kg 72.9 kg    Exam:  GEN: NAD, sitting up in bed SKIN: Warm and dry EYES: No pallor or icterus ENT: MMM CV: RRR PULM: CTA B ABD: soft, ND, NT, +BS CNS: AAO x 3, non focal EXT: No edema or tenderness     Data Reviewed:   I have personally reviewed following labs and imaging studies:  Labs: Labs show the following:   Basic Metabolic  Panel: Recent Labs  Lab 03/05/24 1246  NA 139  K 4.1  CL 109  CO2 24  GLUCOSE 94  BUN 13  CREATININE 0.72  CALCIUM 9.1   GFR Estimated Creatinine Clearance: 85.8 mL/min (by C-G formula based on SCr of 0.72 mg/dL). Liver Function Tests: Recent Labs  Lab 03/05/24 1246  AST 33  ALT 33  ALKPHOS 59  BILITOT 0.4  PROT 7.5  ALBUMIN 3.2*   No results for input(s): "LIPASE", "AMYLASE" in the last 168 hours. No results for input(s): "AMMONIA" in the last 168 hours. Coagulation profile No results for input(s): "INR", "PROTIME" in the last 168 hours.  CBC: Recent Labs  Lab 03/05/24 1246  WBC 4.1  NEUTROABS 1.5*  HGB 9.6*  HCT 29.7*  MCV 84.9  PLT 316   Cardiac Enzymes: Recent Labs  Lab 03/08/24 0557  CKTOTAL 90   BNP (last 3 results) No results for input(s): "PROBNP" in the last 8760 hours. CBG: No results for input(s): "GLUCAP" in the last 168 hours. D-Dimer: No results for input(s): "DDIMER" in the last 72 hours. Hgb A1c: No results for input(s): "HGBA1C" in the last 72 hours. Lipid Profile: No results for input(s): "CHOL", "HDL", "LDLCALC", "TRIG", "CHOLHDL", "LDLDIRECT" in the last 72 hours. Thyroid function studies: No results for input(s): "TSH", "T4TOTAL", "T3FREE", "THYROIDAB" in the last 72 hours.  Invalid input(s): "FREET3" Anemia work up: No results for input(s): "VITAMINB12", "FOLATE", "FERRITIN", "TIBC", "IRON", "RETICCTPCT" in the last 72 hours. Sepsis Labs: Recent Labs  Lab 03/05/24 1246  WBC 4.1    Microbiology No results found for this or  any previous visit (from the past 240 hours).  Procedures and diagnostic studies:  No results found.             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 If 7PM-7AM, please contact night-coverage at [email or web address]     03/08/2024, 3:04 PM

## 2024-03-09 DIAGNOSIS — F02818 Dementia in other diseases classified elsewhere, unspecified severity, with other behavioral disturbance: Secondary | ICD-10-CM | POA: Diagnosis not present

## 2024-03-09 DIAGNOSIS — B20 Human immunodeficiency virus [HIV] disease: Secondary | ICD-10-CM | POA: Diagnosis not present

## 2024-03-09 LAB — GLUCOSE, CAPILLARY: Glucose-Capillary: 117 mg/dL — ABNORMAL HIGH (ref 70–99)

## 2024-03-09 LAB — ALDOLASE: Aldolase: 3.2 U/L — ABNORMAL LOW (ref 3.3–10.3)

## 2024-03-09 NOTE — Progress Notes (Addendum)
 Progress Note    Meghan Jarvis  UJW:[id number] DOB: 1976/01/04  DOA: 12/08/2023 [name]      Brief Narrative:    Medical records reviewed and are as summarized below:  Meghan Jarvis is a 48 y.o. female with polysubstance use disorder (ETOH, cocaine), atrial fibrillation,, homelessness, AIDS, hypothyroidism, and DM who presented on 12/08/23 with acute respiratory failure.  She was found down by a friend and could not wake her up.  She was found to be hypoxic in the 70s. Admitted to ICU, intubated, started on antibiotics for pneumonia.  12/31: extubated. ID consulted.  MRI brain with advanced cerebral atrophy.  Psych evaluated and determined that patient has poor judgment and lacks capacity.  She needs assistance with housing and finances due to cognitive decline.  APS/DSS pending guardianship and will need placement.        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   HIV Associated Neurocognitive Disorder HIV w/ progression to AIDS  Mental status improved from admission but patient is a poor historian and does not have capacity to make decisions at this time per psychiatry DSS referral by [facility] MRI did not show any enhancing lesions but did show severe cerebral atrophy - likely HIV associated neurocognitive disorder Viral load 2 million, CD4 14 on admission ID recommended Biktarvy ( 50-200-25 mg) and azithromycin 1200mg  weekly for MAI prophylaxis and Atovaquone for  PCP and toxo prophylaxis at discharge. Toxoplasma antibody IgG came back elevated but MRI of the brain does not show any enhancing lesions Repeat viral load down to 34,300    Right hip pain. Right lower quadrant abdominal pain. MRI right hip (on 02/25/2024) showed intramuscular edema enhancement within the right gluteus medius and minimus muscles and lateral aspect of right gluteus maximus muscle.  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  There is also evidence of moderate osteoarthritis of the right hip. He was evaluated by Dr. Dallas Schimke, orthopedic surgeon, on 03/06/2024.  Conservative management with analgesics and PT recommended. Analgesics as needed for pain Apply cold compresses as needed to right hip/right buttock    Diarrhea Improved    Hypotension BP is okay.  Continue midodrine   Social issues Patient is homeless and has polysubstance use disorder (alcohol, cocaine).  No safe discharge plan at the moment. Awaiting legal guardianship    Diet Order             DIET DYS 3 Room service appropriate? Yes; Fluid consistency: Thin  Diet effective now                            Consultants: ID specialist Orthopedic surgeon  Procedures: None    Medications:    atovaquone  1,500 mg Oral Q breakfast   azithromycin  1,200 mg Oral Weekly   bictegravir-emtricitabine-tenofovir AF  1 tablet Oral Daily   DULoxetine  30 mg Oral QHS   enoxaparin (LOVENOX) injection  40 mg Subcutaneous QHS  folic acid  1 mg Oral Daily   hydrocerin   Topical BID   lidocaine  1 patch Transdermal Q24H   lipase/protease/amylase  24,000 Units Oral TID AC   midodrine  5 mg Oral TID WC   multivitamin with minerals  1 tablet Oral QHS   pantoprazole  40 mg Oral BID   senna-docusate  2 tablet Oral BID   thiamine  100 mg Oral Daily   Continuous Infusions:   Anti-infectives (From admission, onward)    Start     Dose/Rate Route Frequency Ordered Stop    01/25/24 0000  azithromycin (ZITHROMAX) 600 MG tablet        1,200 mg Oral Weekly 01/25/24 1610     01/25/24 0000  bictegravir-emtricitabine-tenofovir AF (BIKTARVY) 50-200-25 MG TABS tablet        1 tablet Oral Daily 01/25/24 1610     01/25/24 0000  atovaquone (MEPRON) 750 MG/5ML suspension        1,500 mg Oral Daily with breakfast 01/25/24 1610     12/25/23 0800  atovaquone (MEPRON) 750 MG/5ML suspension 1,500 mg        1,500 mg Oral Daily with breakfast 12/24/23 1558     12/24/23 1000  sulfamethoxazole-trimethoprim (BACTRIM) 400-80 MG per tablet 1 tablet  Status:  Discontinued        1 tablet Oral Daily 12/24/23 0749 12/24/23 1557   12/19/23 1400  azithromycin (ZITHROMAX) tablet 1,200 mg        1,200 mg Oral Weekly 12/18/23 1346     12/18/23 1000  sulfamethoxazole-trimethoprim (BACTRIM DS) 800-160 MG per tablet 1 tablet  Status:  Discontinued        1 tablet Oral Daily 12/17/23 1621 12/24/23 0748   12/14/23 1530  bictegravir-emtricitabine-tenofovir AF (BIKTARVY) 50-200-25 MG per tablet 1 tablet        1 tablet Oral Daily 12/14/23 1442     12/12/23 1000  amoxicillin-clavulanate (AUGMENTIN) 875-125 MG per tablet 1 tablet        1 tablet Oral Every 12 hours 12/11/23 1345 12/12/23 2131   12/12/23 1000  azithromycin (ZITHROMAX) tablet 1,250 mg  Status:  Discontinued        1,200 mg Oral Weekly 12/11/23 1618 12/18/23 1346   12/11/23 2000  sulfamethoxazole-trimethoprim (BACTRIM) 400-80 MG per tablet 1 tablet  Status:  Discontinued        1 tablet Oral Daily 12/11/23 1618 12/17/23 1621   12/09/23 2200  azithromycin (ZITHROMAX) 500 mg in sodium chloride 0.9 % 250 mL IVPB  Status:  Discontinued        500 mg 250 mL/hr over 60 Minutes Intravenous Every 24 hours 12/09/23 0734 12/11/23 1618   12/09/23 2000  cefTRIAXone (ROCEPHIN) 2 g in sodium chloride 0.9 % 100 mL IVPB  Status:  Discontinued        2 g 200 mL/hr over 30 Minutes Intravenous Every 24 hours 12/09/23 0953 12/09/23 1840   12/09/23  2000  piperacillin-tazobactam (ZOSYN) IVPB 3.375 g        3.375 g 12.5 mL/hr over 240 Minutes Intravenous Every 8 hours 12/09/23 1850 12/12/23 0037   12/09/23 0600  Ampicillin-Sulbactam (UNASYN) 3 g in sodium chloride 0.9 % 100 mL IVPB  Status:  Discontinued        3 g 200 mL/hr over 30 Minutes Intravenous Every 6 hours 12/09/23 0442 12/09/23 0953   12/08/23 2215  cefTRIAXone (ROCEPHIN) 2 g in sodium chloride 0.9 % 100 mL IVPB  2 g 200 mL/hr over 30 Minutes Intravenous Once 12/08/23 2214 12/08/23 2308   12/08/23 2215  azithromycin (ZITHROMAX) 500 mg in sodium chloride 0.9 % 250 mL IVPB        500 mg 250 mL/hr over 60 Minutes Intravenous  Once 12/08/23 2214 12/09/23 0000              Family Communication/Anticipated D/C date and plan/Code Status   DVT prophylaxis: enoxaparin (LOVENOX) injection 40 mg Start: 12/11/23 2200     Code Status: Full Code  Family Communication: None Disposition Plan: To be determined   Status is: Inpatient Remains inpatient appropriate because: Awaiting legal guardianship       Subjective:   Interval events noted.  She complains of pain in the right hip.  No other complaints.  Objective:    Vitals:   03/08/24 1945 03/09/24 0350 03/09/24 0500 03/09/24 0757  BP: 106/74 107/79  107/80  Pulse: 91 90  (!) 106  Resp: 18 18    Temp: 98.2 F (36.8 C) 98.7 F (37.1 C)  98.4 F (36.9 C)  TempSrc: Oral Oral  Oral  SpO2: 99% 100%  99%  Weight:   73 kg   Height:       No data found.   Intake/Output Summary (Last 24 hours) at 03/09/2024 1429 Last data filed at 03/09/2024 0800 Gross per 24 hour  Intake 240 ml  Output --  Net 240 ml   Filed Weights   03/07/24 0345 03/08/24 0500 03/09/24 0500  Weight: 72.9 kg 72.9 kg 73 kg    Exam:  GEN: NAD SKIN: Warm and dry EYES: No pallor or icterus ENT: MMM CV: RRR PULM: CTA B ABD: soft, ND, NT, +BS CNS: AAO x 3, non focal EXT: Mild right hip swelling and tenderness.  No  erythema     Data Reviewed:   I have personally reviewed following labs and imaging studies:  Labs: Labs show the following:   Basic Metabolic Panel: Recent Labs  Lab 03/05/24 1246  NA 139  K 4.1  CL 109  CO2 24  GLUCOSE 94  BUN 13  CREATININE 0.72  CALCIUM 9.1   GFR Estimated Creatinine Clearance: 85.8 mL/min (by C-G formula based on SCr of 0.72 mg/dL). Liver Function Tests: Recent Labs  Lab 03/05/24 1246  AST 33  ALT 33  ALKPHOS 59  BILITOT 0.4  PROT 7.5  ALBUMIN 3.2*   No results for input(s): "LIPASE", "AMYLASE" in the last 168 hours. No results for input(s): "AMMONIA" in the last 168 hours. Coagulation profile No results for input(s): "INR", "PROTIME" in the last 168 hours.  CBC: Recent Labs  Lab 03/05/24 1246  WBC 4.1  NEUTROABS 1.5*  HGB 9.6*  HCT 29.7*  MCV 84.9  PLT 316   Cardiac Enzymes: Recent Labs  Lab 03/08/24 0557  CKTOTAL 90   BNP (last 3 results) No results for input(s): "PROBNP" in the last 8760 hours. CBG: Recent Labs  Lab 03/09/24 0759  GLUCAP 117*   D-Dimer: No results for input(s): "DDIMER" in the last 72 hours. Hgb A1c: No results for input(s): "HGBA1C" in the last 72 hours. Lipid Profile: No results for input(s): "CHOL", "HDL", "LDLCALC", "TRIG", "CHOLHDL", "LDLDIRECT" in the last 72 hours. Thyroid function studies: No results for input(s): "TSH", "T4TOTAL", "T3FREE", "THYROIDAB" in the last 72 hours.  Invalid input(s): "FREET3" Anemia work up: No results for input(s): "VITAMINB12", "FOLATE", "FERRITIN", "TIBC", "IRON", "RETICCTPCT" in the last 72 hours. Sepsis Labs: Recent Labs  Lab 03/05/24 1246  WBC 4.1    Microbiology No results found for this or any previous visit (from the past 240 hours).  Procedures and diagnostic studies:  No results found.             LOS: 92 days   Marten Iles  [employer] loss adjuster on [email or web address]. If 7PM-7AM, please contact night-coverage at  [email or web address]     03/09/2024, 2:29 PM

## 2024-03-10 DIAGNOSIS — B20 Human immunodeficiency virus [HIV] disease: Secondary | ICD-10-CM | POA: Diagnosis not present

## 2024-03-10 DIAGNOSIS — F02818 Dementia in other diseases classified elsewhere, unspecified severity, with other behavioral disturbance: Secondary | ICD-10-CM | POA: Diagnosis not present

## 2024-03-10 LAB — TOXOPLASMA ANTIBODIES- IGG AND  IGM
Toxoplasma Antibody- IgM: <3 [AU]/ml (ref 0.0–7.9)
Toxoplasma IgG Ratio: >400 [IU]/mL — ABNORMAL HIGH (ref 0.0–7.1)

## 2024-03-10 NOTE — Progress Notes (Signed)
 Progress Note    Meghan Jarvis  ZDG:[id number] DOB: 1976-08-28  DOA: 12/08/2023 [name]      Brief Narrative:    Medical records reviewed and are as summarized below:  Meghan Jarvis is a 48 y.o. female with polysubstance use disorder (ETOH, cocaine), atrial fibrillation,, homelessness, AIDS, hypothyroidism, and DM who presented on 12/08/23 with acute respiratory failure.  She was found down by a friend and could not wake her up.  She was found to be hypoxic in the 70s. Admitted to ICU, intubated, started on antibiotics for pneumonia.  12/31: extubated. ID consulted.  MRI brain with advanced cerebral atrophy.  Psych evaluated and determined that patient has poor judgment and lacks capacity.  She needs assistance with housing and finances due to cognitive decline.  APS/DSS pending guardianship and will need placement.        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   HIV Associated Neurocognitive Disorder HIV w/ progression to AIDS  Mental status improved from admission but patient is a poor historian and does not have capacity to make decisions at this time per psychiatry DSS referral by [facility] MRI did not show any enhancing lesions but did show severe cerebral atrophy - likely HIV associated neurocognitive disorder Viral load 2 million, CD4 14 on admission ID recommended Biktarvy ( 50-200-25 mg) and azithromycin 1200mg  weekly for MAI prophylaxis and Atovaquone for  PCP and toxo prophylaxis at discharge. Toxoplasma antibody IgG came back elevated but MRI of the brain does not show any enhancing lesions Repeat viral load down to 34,300    Right hip pain. Right lower quadrant abdominal pain. MRI right hip (on 02/25/2024) showed intramuscular edema enhancement within the right gluteus medius and minimus muscles and lateral aspect of right gluteus maximus muscle.  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  There is also evidence of moderate osteoarthritis of the right hip. He was evaluated by Dr. Dallas Schimke, orthopedic surgeon, on 03/06/2024.  Conservative management with analgesics and PT recommended. Tylenol and ibuprofen as needed for pain Apply cold compresses as needed to right hip/right buttock    Diarrhea Improved    Hypotension BP stable.  Continue midodrine   Social issues Patient is homeless and has polysubstance use disorder (alcohol, cocaine).  No safe discharge plan at the moment. Awaiting legal guardianship    Diet Order             DIET DYS 3 Room service appropriate? Yes; Fluid consistency: Thin  Diet effective now                            Consultants: ID specialist Orthopedic surgeon  Procedures: None    Medications:    atovaquone  1,500 mg Oral Q breakfast   azithromycin  1,200 mg Oral Weekly   bictegravir-emtricitabine-tenofovir AF  1 tablet Oral Daily   DULoxetine  30 mg Oral QHS   enoxaparin (LOVENOX) injection  40 mg Subcutaneous QHS  folic acid  1 mg Oral Daily   hydrocerin   Topical BID   lidocaine  1 patch Transdermal Q24H   lipase/protease/amylase  24,000 Units Oral TID AC   midodrine  5 mg Oral TID WC   multivitamin with minerals  1 tablet Oral QHS   pantoprazole  40 mg Oral BID   senna-docusate  2 tablet Oral BID   thiamine  100 mg Oral Daily   Continuous Infusions:   Anti-infectives (From admission, onward)    Start     Dose/Rate Route Frequency  Ordered Stop   01/25/24 0000  azithromycin (ZITHROMAX) 600 MG tablet        1,200 mg Oral Weekly 01/25/24 1610     01/25/24 0000  bictegravir-emtricitabine-tenofovir AF (BIKTARVY) 50-200-25 MG TABS tablet        1 tablet Oral Daily 01/25/24 1610     01/25/24 0000  atovaquone (MEPRON) 750 MG/5ML suspension        1,500 mg Oral Daily with breakfast 01/25/24 1610     12/25/23 0800  atovaquone (MEPRON) 750 MG/5ML suspension 1,500 mg        1,500 mg Oral Daily with breakfast 12/24/23 1558     12/24/23 1000  sulfamethoxazole-trimethoprim (BACTRIM) 400-80 MG per tablet 1 tablet  Status:  Discontinued        1 tablet Oral Daily 12/24/23 0749 12/24/23 1557   12/19/23 1400  azithromycin (ZITHROMAX) tablet 1,200 mg        1,200 mg Oral Weekly 12/18/23 1346     12/18/23 1000  sulfamethoxazole-trimethoprim (BACTRIM DS) 800-160 MG per tablet 1 tablet  Status:  Discontinued        1 tablet Oral Daily 12/17/23 1621 12/24/23 0748   12/14/23 1530  bictegravir-emtricitabine-tenofovir AF (BIKTARVY) 50-200-25 MG per tablet 1 tablet        1 tablet Oral Daily 12/14/23 1442     12/12/23 1000  amoxicillin-clavulanate (AUGMENTIN) 875-125 MG per tablet 1 tablet        1 tablet Oral Every 12 hours 12/11/23 1345 12/12/23 2131   12/12/23 1000  azithromycin (ZITHROMAX) tablet 1,250 mg  Status:  Discontinued        1,200 mg Oral Weekly 12/11/23 1618 12/18/23 1346   12/11/23 2000  sulfamethoxazole-trimethoprim (BACTRIM) 400-80 MG per tablet 1 tablet  Status:  Discontinued        1 tablet Oral Daily 12/11/23 1618 12/17/23 1621   12/09/23 2200  azithromycin (ZITHROMAX) 500 mg in sodium chloride 0.9 % 250 mL IVPB  Status:  Discontinued        500 mg 250 mL/hr over 60 Minutes Intravenous Every 24 hours 12/09/23 0734 12/11/23 1618   12/09/23 2000  cefTRIAXone (ROCEPHIN) 2 g in sodium chloride 0.9 % 100 mL IVPB  Status:  Discontinued        2 g 200 mL/hr over 30 Minutes Intravenous Every 24 hours 12/09/23 0953 12/09/23 1840    12/09/23 2000  piperacillin-tazobactam (ZOSYN) IVPB 3.375 g        3.375 g 12.5 mL/hr over 240 Minutes Intravenous Every 8 hours 12/09/23 1850 12/12/23 0037   12/09/23 0600  Ampicillin-Sulbactam (UNASYN) 3 g in sodium chloride 0.9 % 100 mL IVPB  Status:  Discontinued        3 g 200 mL/hr over 30 Minutes Intravenous Every 6 hours 12/09/23 0442 12/09/23 0953   12/08/23 2215  cefTRIAXone (ROCEPHIN) 2 g in sodium chloride 0.9 % 100 mL IVPB  2 g 200 mL/hr over 30 Minutes Intravenous Once 12/08/23 2214 12/08/23 2308   12/08/23 2215  azithromycin (ZITHROMAX) 500 mg in sodium chloride 0.9 % 250 mL IVPB        500 mg 250 mL/hr over 60 Minutes Intravenous  Once 12/08/23 2214 12/09/23 0000              Family Communication/Anticipated D/C date and plan/Code Status   DVT prophylaxis: enoxaparin (LOVENOX) injection 40 mg Start: 12/11/23 2200     Code Status: Full Code  Family Communication: None Disposition Plan: To be determined   Status is: Inpatient Remains inpatient appropriate because: Awaiting legal guardianship       Subjective:   Interval events noted.  She complains of pain in the right hip.  Objective:    Vitals:   03/09/24 1537 03/09/24 1945 03/10/24 0407 03/10/24 0847  BP: 99/66 101/69 113/83 110/77  Pulse: 99 98 84 (!) 102  Resp: 15 18 18 18   Temp: 98 F (36.7 C) 97.8 F (36.6 C) 97.9 F (36.6 C) 98.2 F (36.8 C)  TempSrc: Oral Oral Oral   SpO2: 99% 100% 100% 100%  Weight:    74.7 kg  Height:       No data found.   Intake/Output Summary (Last 24 hours) at 03/10/2024 1453 Last data filed at 03/10/2024 0900 Gross per 24 hour  Intake 360 ml  Output 1 ml  Net 359 ml   Filed Weights   03/08/24 0500 03/09/24 0500 03/10/24 0847  Weight: 72.9 kg 73 kg 74.7 kg    Exam:  GEN: NAD SKIN: Warm and dry EYES: EOMI ENT: MMM CV: RRR PULM: CTA B ABD: soft, ND, NT, +BS CNS: AAO x 3, non focal EXT: Mild right hip tenderness       Data  Reviewed:   I have personally reviewed following labs and imaging studies:  Labs: Labs show the following:   Basic Metabolic Panel: Recent Labs  Lab 03/05/24 1246  NA 139  K 4.1  CL 109  CO2 24  GLUCOSE 94  BUN 13  CREATININE 0.72  CALCIUM 9.1   GFR Estimated Creatinine Clearance: 85.8 mL/min (by C-G formula based on SCr of 0.72 mg/dL). Liver Function Tests: Recent Labs  Lab 03/05/24 1246  AST 33  ALT 33  ALKPHOS 59  BILITOT 0.4  PROT 7.5  ALBUMIN 3.2*   No results for input(s): "LIPASE", "AMYLASE" in the last 168 hours. No results for input(s): "AMMONIA" in the last 168 hours. Coagulation profile No results for input(s): "INR", "PROTIME" in the last 168 hours.  CBC: Recent Labs  Lab 03/05/24 1246  WBC 4.1  NEUTROABS 1.5*  HGB 9.6*  HCT 29.7*  MCV 84.9  PLT 316   Cardiac Enzymes: Recent Labs  Lab 03/08/24 0557  CKTOTAL 90   BNP (last 3 results) No results for input(s): "PROBNP" in the last 8760 hours. CBG: Recent Labs  Lab 03/09/24 0759  GLUCAP 117*   D-Dimer: No results for input(s): "DDIMER" in the last 72 hours. Hgb A1c: No results for input(s): "HGBA1C" in the last 72 hours. Lipid Profile: No results for input(s): "CHOL", "HDL", "LDLCALC", "TRIG", "CHOLHDL", "LDLDIRECT" in the last 72 hours. Thyroid function studies: No results for input(s): "TSH", "T4TOTAL", "T3FREE", "THYROIDAB" in the last 72 hours.  Invalid input(s): "FREET3" Anemia work up: No results for input(s): "VITAMINB12", "FOLATE", "FERRITIN", "TIBC", "IRON", "RETICCTPCT" in the last 72 hours. Sepsis Labs: Recent Labs  Lab 03/05/24 1246  WBC  4.1    Microbiology No results found for this or any previous visit (from the past 240 hours).  Procedures and diagnostic studies:  No results found.             LOS: 93 days   Chrishauna Mee  [employer] loss adjuster on [email or web address]. If 7PM-7AM, please contact night-coverage at  [email or web address]     03/10/2024, 2:53 PM

## 2024-03-11 DIAGNOSIS — B20 Human immunodeficiency virus [HIV] disease: Secondary | ICD-10-CM | POA: Diagnosis not present

## 2024-03-11 DIAGNOSIS — F02818 Dementia in other diseases classified elsewhere, unspecified severity, with other behavioral disturbance: Secondary | ICD-10-CM | POA: Diagnosis not present

## 2024-03-11 NOTE — Progress Notes (Signed)
 Progress Note    Meghan Jarvis  WGN:[id number] DOB: 1976/08/23  DOA: 12/08/2023 [name]      Brief Narrative:    Medical records reviewed and are as summarized below:  Meghan Jarvis is a 48 y.o. female with polysubstance use disorder (ETOH, cocaine), atrial fibrillation,, homelessness, AIDS, hypothyroidism, and DM who presented on 12/08/23 with acute respiratory failure.  She was found down by a friend and could not wake her up.  She was found to be hypoxic in the 70s. Admitted to ICU, intubated, started on antibiotics for pneumonia.  12/31: extubated. ID consulted.  MRI brain with advanced cerebral atrophy.  Psych evaluated and determined that patient has poor judgment and lacks capacity.  She needs assistance with housing and finances due to cognitive decline.  APS/DSS pending guardianship and will need placement.        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   HIV Associated Neurocognitive Disorder HIV w/ progression to AIDS  Mental status improved from admission but patient is a poor historian and does not have capacity to make decisions at this time per psychiatry DSS referral by [facility] MRI did not show any enhancing lesions but did show severe cerebral atrophy - likely HIV associated neurocognitive disorder Viral load 2 million, CD4 14 on admission ID recommended Biktarvy ( 50-200-25 mg) and azithromycin 1200mg  weekly for MAI prophylaxis and Atovaquone for  PCP and toxo prophylaxis at discharge. Toxoplasma antibody IgG came back elevated but MRI of the brain does not show any enhancing lesions Repeat viral load down to 34,300    Right hip pain. Right lower quadrant abdominal pain. MRI right hip (on 02/25/2024) showed intramuscular edema enhancement within the right gluteus medius and minimus muscles and lateral aspect of right gluteus maximus muscle.  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  There is also evidence of moderate osteoarthritis of the right hip. He was evaluated by Dr. Dallas Schimke, orthopedic surgeon, on 03/06/2024.  Conservative management with analgesics and PT recommended. Tylenol and ibuprofen as needed for pain Apply cold compresses as needed to right hip/right buttock    Diarrhea Improved    Hypotension BP stable.  Continue midodrine   Social issues Patient is homeless and has polysubstance use disorder (alcohol, cocaine).  No safe discharge plan at the moment. Awaiting legal guardianship    Diet Order             DIET DYS 3 Room service appropriate? Yes; Fluid consistency: Thin  Diet effective now                            Consultants: ID specialist Orthopedic surgeon  Procedures: None    Medications:    atovaquone  1,500 mg Oral Q breakfast   azithromycin  1,200 mg Oral Weekly   bictegravir-emtricitabine-tenofovir AF  1 tablet Oral Daily   DULoxetine  30 mg Oral QHS   enoxaparin (LOVENOX) injection  40 mg Subcutaneous QHS  folic acid  1 mg Oral Daily   hydrocerin   Topical BID   lidocaine  1 patch Transdermal Q24H   lipase/protease/amylase  24,000 Units Oral TID AC   midodrine  5 mg Oral TID WC   multivitamin with minerals  1 tablet Oral QHS   pantoprazole  40 mg Oral BID   senna-docusate  2 tablet Oral BID   thiamine  100 mg Oral Daily   Continuous Infusions:   Anti-infectives (From admission, onward)    Start     Dose/Rate Route Frequency  Ordered Stop   01/25/24 0000  azithromycin (ZITHROMAX) 600 MG tablet        1,200 mg Oral Weekly 01/25/24 1610     01/25/24 0000  bictegravir-emtricitabine-tenofovir AF (BIKTARVY) 50-200-25 MG TABS tablet        1 tablet Oral Daily 01/25/24 1610     01/25/24 0000  atovaquone (MEPRON) 750 MG/5ML suspension        1,500 mg Oral Daily with breakfast 01/25/24 1610     12/25/23 0800  atovaquone (MEPRON) 750 MG/5ML suspension 1,500 mg        1,500 mg Oral Daily with breakfast 12/24/23 1558     12/24/23 1000  sulfamethoxazole-trimethoprim (BACTRIM) 400-80 MG per tablet 1 tablet  Status:  Discontinued        1 tablet Oral Daily 12/24/23 0749 12/24/23 1557   12/19/23 1400  azithromycin (ZITHROMAX) tablet 1,200 mg        1,200 mg Oral Weekly 12/18/23 1346     12/18/23 1000  sulfamethoxazole-trimethoprim (BACTRIM DS) 800-160 MG per tablet 1 tablet  Status:  Discontinued        1 tablet Oral Daily 12/17/23 1621 12/24/23 0748   12/14/23 1530  bictegravir-emtricitabine-tenofovir AF (BIKTARVY) 50-200-25 MG per tablet 1 tablet        1 tablet Oral Daily 12/14/23 1442     12/12/23 1000  amoxicillin-clavulanate (AUGMENTIN) 875-125 MG per tablet 1 tablet        1 tablet Oral Every 12 hours 12/11/23 1345 12/12/23 2131   12/12/23 1000  azithromycin (ZITHROMAX) tablet 1,250 mg  Status:  Discontinued        1,200 mg Oral Weekly 12/11/23 1618 12/18/23 1346   12/11/23 2000  sulfamethoxazole-trimethoprim (BACTRIM) 400-80 MG per tablet 1 tablet  Status:  Discontinued        1 tablet Oral Daily 12/11/23 1618 12/17/23 1621   12/09/23 2200  azithromycin (ZITHROMAX) 500 mg in sodium chloride 0.9 % 250 mL IVPB  Status:  Discontinued        500 mg 250 mL/hr over 60 Minutes Intravenous Every 24 hours 12/09/23 0734 12/11/23 1618   12/09/23 2000  cefTRIAXone (ROCEPHIN) 2 g in sodium chloride 0.9 % 100 mL IVPB  Status:  Discontinued        2 g 200 mL/hr over 30 Minutes Intravenous Every 24 hours 12/09/23 0953 12/09/23 1840    12/09/23 2000  piperacillin-tazobactam (ZOSYN) IVPB 3.375 g        3.375 g 12.5 mL/hr over 240 Minutes Intravenous Every 8 hours 12/09/23 1850 12/12/23 0037   12/09/23 0600  Ampicillin-Sulbactam (UNASYN) 3 g in sodium chloride 0.9 % 100 mL IVPB  Status:  Discontinued        3 g 200 mL/hr over 30 Minutes Intravenous Every 6 hours 12/09/23 0442 12/09/23 0953   12/08/23 2215  cefTRIAXone (ROCEPHIN) 2 g in sodium chloride 0.9 % 100 mL IVPB  2 g 200 mL/hr over 30 Minutes Intravenous Once 12/08/23 2214 12/08/23 2308   12/08/23 2215  azithromycin (ZITHROMAX) 500 mg in sodium chloride 0.9 % 250 mL IVPB        500 mg 250 mL/hr over 60 Minutes Intravenous  Once 12/08/23 2214 12/09/23 0000              Family Communication/Anticipated D/C date and plan/Code Status   DVT prophylaxis: enoxaparin (LOVENOX) injection 40 mg Start: 12/11/23 2200     Code Status: Full Code  Family Communication: None Disposition Plan: To be determined   Status is: Inpatient Remains inpatient appropriate because: Awaiting legal guardianship       Subjective:   Interval events noted.  She said she wants to go home.  She is planning to go live with a friend. Myrlene Broker, RN, was at the bedside  Objective:    Vitals:   03/10/24 2039 03/11/24 0339 03/11/24 0344 03/11/24 0824  BP: 110/80 111/78  117/89  Pulse: (!) 106 97  91  Resp: 18 19  18   Temp: 98.5 F (36.9 C) 98.4 F (36.9 C)  98.5 F (36.9 C)  TempSrc: Oral Oral  Oral  SpO2: 100% 99%  100%  Weight:   77.1 kg   Height:       No data found.   Intake/Output Summary (Last 24 hours) at 03/11/2024 1259 Last data filed at 03/11/2024 1057 Gross per 24 hour  Intake 480 ml  Output --  Net 480 ml   Filed Weights   03/09/24 0500 03/10/24 0847 03/11/24 0344  Weight: 73 kg 74.7 kg 77.1 kg    Exam:  GEN: NAD SKIN: Warm and dry EYES: No pallor or icterus ENT: MMM CV: RRR PULM: CTA B ABD: soft, ND, NT, +BS CNS: AAO x 3, non  focal EXT: No edema or tenderness     Data Reviewed:   I have personally reviewed following labs and imaging studies:  Labs: Labs show the following:   Basic Metabolic Panel: Recent Labs  Lab 03/05/24 1246  NA 139  K 4.1  CL 109  CO2 24  GLUCOSE 94  BUN 13  CREATININE 0.72  CALCIUM 9.1   GFR Estimated Creatinine Clearance: 92.9 mL/min (by C-G formula based on SCr of 0.72 mg/dL). Liver Function Tests: Recent Labs  Lab 03/05/24 1246  AST 33  ALT 33  ALKPHOS 59  BILITOT 0.4  PROT 7.5  ALBUMIN 3.2*   No results for input(s): "LIPASE", "AMYLASE" in the last 168 hours. No results for input(s): "AMMONIA" in the last 168 hours. Coagulation profile No results for input(s): "INR", "PROTIME" in the last 168 hours.  CBC: Recent Labs  Lab 03/05/24 1246  WBC 4.1  NEUTROABS 1.5*  HGB 9.6*  HCT 29.7*  MCV 84.9  PLT 316   Cardiac Enzymes: Recent Labs  Lab 03/08/24 0557  CKTOTAL 90   BNP (last 3 results) No results for input(s): "PROBNP" in the last 8760 hours. CBG: Recent Labs  Lab 03/09/24 0759  GLUCAP 117*   D-Dimer: No results for input(s): "DDIMER" in the last 72 hours. Hgb A1c: No results for input(s): "HGBA1C" in the last 72 hours. Lipid Profile: No results for input(s): "CHOL", "HDL", "LDLCALC", "TRIG", "CHOLHDL", "LDLDIRECT" in the last 72 hours. Thyroid function studies: No results for input(s): "TSH", "T4TOTAL", "T3FREE", "THYROIDAB" in the last 72 hours.  Invalid input(s): "FREET3" Anemia work up: No results for input(s): "VITAMINB12", "FOLATE", "FERRITIN", "TIBC", "IRON", "RETICCTPCT" in the last  72 hours. Sepsis Labs: Recent Labs  Lab 03/05/24 1246  WBC 4.1    Microbiology No results found for this or any previous visit (from the past 240 hours).  Procedures and diagnostic studies:  No results found.             LOS: 94 days   Cyndi Montejano  [facility] [employer] loss adjuster on [email or web address]. If 7PM-7AM, please contact  night-coverage at [email or web address]     03/11/2024, 12:59 PM

## 2024-03-11 NOTE — TOC Progression Note (Signed)
 Transition of Care [facility]) - Progression Note    Patient Details  Name: Meghan Jarvis MRN: [id number] Date of Birth: [birth year]  Transition of [facility] CM/SW Contact  Margarito Liner, LCSW Phone Number: 03/11/2024, 12:14 PM  Clinical Narrative:   TOC continues to follow.  Expected Discharge Plan and Services         Expected Discharge Date: 01/10/24                                     Social Determinants of Health (SDOH) Interventions SDOH Screenings   Food Insecurity: Patient Unable To Answer (12/09/2023)  Recent Concern: Food Insecurity - Food Insecurity Present (09/26/2023)  Housing: Patient Unable To Answer (12/09/2023)  Recent Concern: Housing - Medium Risk (09/26/2023)  Transportation Needs: Patient Unable To Answer (12/09/2023)  Recent Concern: Transportation Needs - Unmet Transportation Needs (10/11/2023)  Utilities: Patient Unable To Answer (12/09/2023)  Recent Concern: Utilities - At Risk (09/25/2023)  Tobacco Use: High Risk (12/08/2023)    Readmission Risk Interventions     No data to display

## 2024-03-11 NOTE — Plan of Care (Signed)
  Problem: Coping: Goal: Ability to adjust to condition or change in health will improve Outcome: Progressing   Problem: Fluid Volume: Goal: Ability to maintain a balanced intake and output will improve Outcome: Progressing   Problem: Health Behavior/Discharge Planning: Goal: Ability to identify and utilize available resources and services will improve Outcome: Progressing Goal: Ability to manage health-related needs will improve Outcome: Progressing   Problem: Metabolic: Goal: Ability to maintain appropriate glucose levels will improve Outcome: Progressing   Problem: Nutritional: Goal: Maintenance of adequate nutrition will improve Outcome: Progressing Goal: Progress toward achieving an optimal weight will improve Outcome: Progressing   Problem: Skin Integrity: Goal: Risk for impaired skin integrity will decrease Outcome: Progressing   Problem: Tissue Perfusion: Goal: Adequacy of tissue perfusion will improve Outcome: Progressing   Problem: Education: Goal: Knowledge of General Education information will improve Description: Including pain rating scale, medication(s)/side effects and non-pharmacologic comfort measures Outcome: Progressing   Problem: Health Behavior/Discharge Planning: Goal: Ability to manage health-related needs will improve Outcome: Progressing   Problem: Clinical Measurements: Goal: Ability to maintain clinical measurements within normal limits will improve Outcome: Progressing Goal: Will remain free from infection Outcome: Progressing Goal: Diagnostic test results will improve Outcome: Progressing Goal: Respiratory complications will improve Outcome: Progressing Goal: Cardiovascular complication will be avoided Outcome: Progressing   Problem: Activity: Goal: Risk for activity intolerance will decrease Outcome: Progressing   Problem: Nutrition: Goal: Adequate nutrition will be maintained Outcome: Progressing   Problem: Coping: Goal:  Level of anxiety will decrease Outcome: Progressing   Problem: Elimination: Goal: Will not experience complications related to bowel motility Outcome: Progressing Goal: Will not experience complications related to urinary retention Outcome: Progressing   Problem: Pain Management: Goal: General experience of comfort will improve Outcome: Progressing   Problem: Safety: Goal: Ability to remain free from injury will improve Outcome: Progressing   Problem: Skin Integrity: Goal: Risk for impaired skin integrity will decrease Outcome: Progressing   Problem: Activity: Goal: Ability to tolerate increased activity will improve Outcome: Progressing   Problem: Respiratory: Goal: Ability to maintain a clear airway and adequate ventilation will improve Outcome: Progressing

## 2024-03-12 DIAGNOSIS — B20 Human immunodeficiency virus [HIV] disease: Secondary | ICD-10-CM | POA: Diagnosis not present

## 2024-03-12 DIAGNOSIS — F02818 Dementia in other diseases classified elsewhere, unspecified severity, with other behavioral disturbance: Secondary | ICD-10-CM | POA: Diagnosis not present

## 2024-03-12 LAB — CBC
HCT: 27.4 % — ABNORMAL LOW (ref 36.0–46.0)
Hemoglobin: 8.9 g/dL — ABNORMAL LOW (ref 12.0–15.0)
MCH: 27.2 pg (ref 26.0–34.0)
MCHC: 32.5 g/dL (ref 30.0–36.0)
MCV: 83.8 fL (ref 80.0–100.0)
Platelets: 252 10*3/uL (ref 150–400)
RBC: 3.27 MIL/uL — ABNORMAL LOW (ref 3.87–5.11)
RDW: 14.8 % (ref 11.5–15.5)
WBC: 4.5 10*3/uL (ref 4.0–10.5)
nRBC: 0 % (ref 0.0–0.2)

## 2024-03-12 LAB — FUNGITELL BETA-D-GLUCAN
Fungitell Value:: 79.276 pg/mL
Result Name:: POSITIVE — AB

## 2024-03-12 LAB — CREATININE, SERUM
Creatinine, Ser: 0.71 mg/dL (ref 0.44–1.00)
GFR, Estimated: >60 mL/min (ref >60)

## 2024-03-12 NOTE — Plan of Care (Signed)
  Problem: Coping: Goal: Ability to adjust to condition or change in health will improve Outcome: Progressing   Problem: Fluid Volume: Goal: Ability to maintain a balanced intake and output will improve Outcome: Progressing   Problem: Health Behavior/Discharge Planning: Goal: Ability to identify and utilize available resources and services will improve Outcome: Progressing Goal: Ability to manage health-related needs will improve Outcome: Progressing   Problem: Metabolic: Goal: Ability to maintain appropriate glucose levels will improve Outcome: Progressing   Problem: Nutritional: Goal: Maintenance of adequate nutrition will improve Outcome: Progressing Goal: Progress toward achieving an optimal weight will improve Outcome: Progressing   Problem: Skin Integrity: Goal: Risk for impaired skin integrity will decrease Outcome: Progressing   Problem: Tissue Perfusion: Goal: Adequacy of tissue perfusion will improve Outcome: Progressing   Problem: Education: Goal: Knowledge of General Education information will improve Description: Including pain rating scale, medication(s)/side effects and non-pharmacologic comfort measures Outcome: Progressing   Problem: Health Behavior/Discharge Planning: Goal: Ability to manage health-related needs will improve Outcome: Progressing   Problem: Clinical Measurements: Goal: Ability to maintain clinical measurements within normal limits will improve Outcome: Progressing Goal: Will remain free from infection Outcome: Progressing Goal: Diagnostic test results will improve Outcome: Progressing Goal: Respiratory complications will improve Outcome: Progressing Goal: Cardiovascular complication will be avoided Outcome: Progressing   Problem: Activity: Goal: Risk for activity intolerance will decrease Outcome: Progressing   Problem: Nutrition: Goal: Adequate nutrition will be maintained Outcome: Progressing   Problem: Coping: Goal:  Level of anxiety will decrease Outcome: Progressing   Problem: Elimination: Goal: Will not experience complications related to bowel motility Outcome: Progressing Goal: Will not experience complications related to urinary retention Outcome: Progressing   Problem: Pain Management: Goal: General experience of comfort will improve Outcome: Progressing   Problem: Safety: Goal: Ability to remain free from injury will improve Outcome: Progressing   Problem: Skin Integrity: Goal: Risk for impaired skin integrity will decrease Outcome: Progressing   Problem: Activity: Goal: Ability to tolerate increased activity will improve Outcome: Progressing   Problem: Respiratory: Goal: Ability to maintain a clear airway and adequate ventilation will improve Outcome: Progressing

## 2024-03-12 NOTE — Progress Notes (Signed)
 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 Poor historian and does not have capacity to make decisions as per psych.  Awaiting guardianship. Continue on biktarvy. Continue on atovaquone for PCP ppx.  HIV associated neurocognitive disorder: MRI did not show any enhancing lesions but did show severe cerebral atrophy likely secondary to HIV/AIDS.    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 Continue w/ conservative management as per ortho surg.    Diarrhea: imodium prn. Resolved    Hypotension: continue on midodrine    Social issues: homeless and has polysubstance use disorder (alcohol, cocaine).  No safe discharge plan. Awaiting guardianship       DVT prophylaxis:  lovenox Code Status: full  Family Communication:  Disposition Plan: no safe d/c plan. Waiting on guardianship  Level of care: Med-Surg  Status is: Inpatient Remains inpatient appropriate because: medically stable. Waiting on safe d/c plan & guardianship     Consultants:    Procedures:   Antimicrobials:  Subjective: Pt c/o hip pain   Objective: Vitals:   03/11/24 0824 03/11/24 1521 03/11/24 2058 03/12/24 0404  BP: 117/89 112/75 104/80 102/69  Pulse: 91 95 94 91  Resp: 18 18 18 20   Temp: 98.5 F (36.9 C) 98.3 F (36.8 C) 98.6 F (37 C) 98.4 F (36.9 C)  TempSrc: Oral Oral    SpO2: 100% 98% 100% 100%  Weight:      Height:        Intake/Output Summary (Last 24 hours) at 03/12/2024 0818 Last data filed at 03/11/2024 1057 Gross per 24 hour  Intake 240 ml  Output --  Net 240 ml   Filed  Weights   03/09/24 0500 03/10/24 0847 03/11/24 0344  Weight: 73 kg 74.7 kg 77.1 kg    Examination:  General exam: Appears uncomfortable   Respiratory system: Clear to auscultation. Respiratory effort normal. Cardiovascular system: S1 & S2 +. No rubs, gallops or clicks.  Gastrointestinal system: Abdomen is nondistended, soft and nontender.Normal bowel sounds heard. Central nervous system: Alert and awake.  Psychiatry: Judgement and insight appears poor. Agitated mood and affect     Data Reviewed: I have personally reviewed following labs and imaging studies  CBC: Recent Labs  Lab 03/05/24 1246 03/12/24 0459  WBC 4.1 4.5  NEUTROABS 1.5*  --   HGB 9.6* 8.9*  HCT 29.7* 27.4*  MCV 84.9 83.8  PLT 316 252   Basic Metabolic Panel: Recent Labs  Lab 03/05/24 1246 03/12/24 0459  NA 139  --   K 4.1  --   CL 109  --   CO2 24  --   GLUCOSE 94  --   BUN 13  --   CREATININE 0.72 0.71  CALCIUM 9.1  --    GFR: Estimated Creatinine Clearance: 92.9 mL/min (by C-G formula based on SCr of 0.71 mg/dL). Liver Function Tests: Recent Labs  Lab 03/05/24 1246  AST 33  ALT 33  ALKPHOS 59  BILITOT 0.4  PROT 7.5  ALBUMIN 3.2*   No results for input(s): "LIPASE", "AMYLASE" in the last 168  hours. No results for input(s): "AMMONIA" in the last 168 hours. Coagulation Profile: No results for input(s): "INR", "PROTIME" in the last 168 hours. Cardiac Enzymes: Recent Labs  Lab 03/08/24 0557  CKTOTAL 90   BNP (last 3 results) No results for input(s): "PROBNP" in the last 8760 hours. HbA1C: No results for input(s): "HGBA1C" in the last 72 hours. CBG: Recent Labs  Lab 03/09/24 0759  GLUCAP 117*   Lipid Profile: No results for input(s): "CHOL", "HDL", "LDLCALC", "TRIG", "CHOLHDL", "LDLDIRECT" in the last 72 hours. Thyroid Function Tests: No results for input(s): "TSH", "T4TOTAL", "FREET4", "T3FREE", "THYROIDAB" in the last 72 hours. Anemia Panel: No results for input(s):  "VITAMINB12", "FOLATE", "FERRITIN", "TIBC", "IRON", "RETICCTPCT" in the last 72 hours. Sepsis Labs: No results for input(s): "PROCALCITON", "LATICACIDVEN" in the last 168 hours.  No results found for this or any previous visit (from the past 240 hours).       Radiology Studies: No results found.      Scheduled Meds:  atovaquone  1,500 mg Oral Q breakfast   azithromycin  1,200 mg Oral Weekly   bictegravir-emtricitabine-tenofovir AF  1 tablet Oral Daily   DULoxetine  30 mg Oral QHS   enoxaparin (LOVENOX) injection  40 mg Subcutaneous QHS   folic acid  1 mg Oral Daily   hydrocerin   Topical BID   lidocaine  1 patch Transdermal Q24H   lipase/protease/amylase  24,000 Units Oral TID AC   midodrine  5 mg Oral TID WC   multivitamin with minerals  1 tablet Oral QHS   pantoprazole  40 mg Oral BID   senna-docusate  2 tablet Oral BID   thiamine  100 mg Oral Daily   Continuous Infusions:   LOS: 95 days       Charise Killian, MD [facility] Pager [phone]  If 7PM-7AM, please contact night-coverage [email or web address] Password [facility] 03/12/2024, 8:18 AM

## 2024-03-13 DIAGNOSIS — B20 Human immunodeficiency virus [HIV] disease: Secondary | ICD-10-CM | POA: Diagnosis not present

## 2024-03-13 DIAGNOSIS — F02818 Dementia in other diseases classified elsewhere, unspecified severity, with other behavioral disturbance: Secondary | ICD-10-CM | POA: Diagnosis not present

## 2024-03-13 NOTE — Progress Notes (Signed)
 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 Poor historian and does not have capacity to make decisions as per psych. Still awaiting guardianship. Continue on biktarvy. Continue on atovaquone for PCP ppx  HIV associated neurocognitive disorder: MRI did not show any enhancing lesions but did show severe cerebral atrophy likely secondary to HIV/AIDS.    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 Continue w/ conservative management as per ortho    Diarrhea: imodium prn. Resolved    Hypotension: continue on midodrine    Social issues: homeless and has polysubstance use disorder (alcohol, cocaine).  No safe discharge plan. Awaiting guardianship       DVT prophylaxis:  lovenox Code Status: full  Family Communication:  Disposition Plan: no safe d/c plan. Waiting on guardianship  Level of care: Med-Surg  Status is: Inpatient Remains inpatient appropriate because: medically stable. Still waiting on guardianship and safe d/c plan    Consultants:    Procedures:   Antimicrobials:  Subjective: Pt c/o "wanting to go home."  Objective: Vitals:   03/12/24 2025 03/13/24 0338 03/13/24 0427 03/13/24 0808  BP: 103/74 112/76  115/77  Pulse: (!) 101 96  91  Resp: 20 (!) 22  18  Temp: 98.6 F (37 C) 98.5 F (36.9 C)  98 F (36.7 C)  TempSrc: Oral Oral    SpO2: 100% 100%  100%  Weight:   77.7 kg   Height:        Intake/Output Summary (Last 24 hours) at 03/13/2024 1350 Last data filed at 03/13/2024 0900 Gross per 24 hour  Intake 240 ml  Output --  Net 240 ml    Filed Weights   03/10/24 0847 03/11/24 0344 03/13/24 0427  Weight: 74.7 kg 77.1 kg 77.7 kg    Examination:  General exam: Appears agitated & frustrated  Respiratory system: clear breath sounds b/l  Cardiovascular system: S1/S2+. No rubs or clicks  Gastrointestinal system: abd is soft, NT, ND & hypoactive bowel sounds  Central nervous system: Alert and awake.  Psychiatry: judgement and insight appears poor. Agitated and frustrated mood     Data Reviewed: I have personally reviewed following labs and imaging studies  CBC: Recent Labs  Lab 03/12/24 0459  WBC 4.5  HGB 8.9*  HCT 27.4*  MCV 83.8  PLT 252   Basic Metabolic Panel: Recent Labs  Lab 03/12/24 0459  CREATININE 0.71   GFR: Estimated Creatinine Clearance: 93.2 mL/min (by C-G formula based on SCr of 0.71 mg/dL). Liver Function Tests: No results for input(s): "AST", "ALT", "ALKPHOS", "BILITOT", "PROT", "ALBUMIN" in the last 168 hours.  No results for input(s): "LIPASE", "AMYLASE" in the last 168 hours. No results for input(s): "AMMONIA" in the last 168 hours. Coagulation Profile: No results for input(s): "INR", "PROTIME" in the last 168 hours. Cardiac Enzymes: Recent Labs  Lab 03/08/24 0557  CKTOTAL 90   BNP (last 3 results) No results for input(s): "PROBNP" in the last 8760 hours. HbA1C: No results for input(s): "HGBA1C" in the last 72 hours. CBG: Recent Labs  Lab 03/09/24 0759  GLUCAP  117*   Lipid Profile: No results for input(s): "CHOL", "HDL", "LDLCALC", "TRIG", "CHOLHDL", "LDLDIRECT" in the last 72 hours. Thyroid Function Tests: No results for input(s): "TSH", "T4TOTAL", "FREET4", "T3FREE", "THYROIDAB" in the last 72 hours. Anemia Panel: No results for input(s): "VITAMINB12", "FOLATE", "FERRITIN", "TIBC", "IRON", "RETICCTPCT" in the last 72 hours. Sepsis Labs: No results for input(s): "PROCALCITON", "LATICACIDVEN" in the last 168 hours.  No results found for this or any previous visit  (from the past 240 hours).       Radiology Studies: No results found.      Scheduled Meds:  atovaquone  1,500 mg Oral Q breakfast   azithromycin  1,200 mg Oral Weekly   bictegravir-emtricitabine-tenofovir AF  1 tablet Oral Daily   DULoxetine  30 mg Oral QHS   enoxaparin (LOVENOX) injection  40 mg Subcutaneous QHS   folic acid  1 mg Oral Daily   hydrocerin   Topical BID   lidocaine  1 patch Transdermal Q24H   lipase/protease/amylase  24,000 Units Oral TID AC   midodrine  5 mg Oral TID WC   multivitamin with minerals  1 tablet Oral QHS   pantoprazole  40 mg Oral BID   senna-docusate  2 tablet Oral BID   thiamine  100 mg Oral Daily   Continuous Infusions:   LOS: 96 days       Charise Killian, MD [facility] Pager [phone]  If 7PM-7AM, please contact night-coverage [email or web address] Password [email or web address] 03/13/2024, 1:50 PM

## 2024-03-13 NOTE — Plan of Care (Signed)
  Problem: Coping: Goal: Ability to adjust to condition or change in health will improve Outcome: Progressing   Problem: Fluid Volume: Goal: Ability to maintain a balanced intake and output will improve Outcome: Progressing   Problem: Health Behavior/Discharge Planning: Goal: Ability to identify and utilize available resources and services will improve Outcome: Progressing Goal: Ability to manage health-related needs will improve Outcome: Progressing   Problem: Metabolic: Goal: Ability to maintain appropriate glucose levels will improve Outcome: Progressing   Problem: Nutritional: Goal: Maintenance of adequate nutrition will improve Outcome: Progressing Goal: Progress toward achieving an optimal weight will improve Outcome: Progressing   Problem: Skin Integrity: Goal: Risk for impaired skin integrity will decrease Outcome: Progressing   Problem: Tissue Perfusion: Goal: Adequacy of tissue perfusion will improve Outcome: Progressing   Problem: Education: Goal: Knowledge of General Education information will improve Description: Including pain rating scale, medication(s)/side effects and non-pharmacologic comfort measures Outcome: Progressing   Problem: Health Behavior/Discharge Planning: Goal: Ability to manage health-related needs will improve Outcome: Progressing   Problem: Clinical Measurements: Goal: Ability to maintain clinical measurements within normal limits will improve Outcome: Progressing Goal: Will remain free from infection Outcome: Progressing Goal: Diagnostic test results will improve Outcome: Progressing Goal: Respiratory complications will improve Outcome: Progressing Goal: Cardiovascular complication will be avoided Outcome: Progressing   Problem: Activity: Goal: Risk for activity intolerance will decrease Outcome: Progressing   Problem: Nutrition: Goal: Adequate nutrition will be maintained Outcome: Progressing   Problem: Coping: Goal:  Level of anxiety will decrease Outcome: Progressing   Problem: Elimination: Goal: Will not experience complications related to bowel motility Outcome: Progressing Goal: Will not experience complications related to urinary retention Outcome: Progressing   Problem: Pain Management: Goal: General experience of comfort will improve Outcome: Progressing   Problem: Safety: Goal: Ability to remain free from injury will improve Outcome: Progressing   Problem: Skin Integrity: Goal: Risk for impaired skin integrity will decrease Outcome: Progressing   Problem: Activity: Goal: Ability to tolerate increased activity will improve Outcome: Progressing   Problem: Respiratory: Goal: Ability to maintain a clear airway and adequate ventilation will improve Outcome: Progressing

## 2024-03-14 DIAGNOSIS — B20 Human immunodeficiency virus [HIV] disease: Secondary | ICD-10-CM | POA: Diagnosis not present

## 2024-03-14 DIAGNOSIS — F02818 Dementia in other diseases classified elsewhere, unspecified severity, with other behavioral disturbance: Secondary | ICD-10-CM | POA: Diagnosis not present

## 2024-03-14 LAB — QUANTIFERON-TB GOLD PLUS: QuantiFERON-TB Gold Plus: NEGATIVE

## 2024-03-14 LAB — QUANTIFERON-TB GOLD PLUS (RQFGPL)
QuantiFERON Mitogen Value: >10 [IU]/mL
QuantiFERON Nil Value: 0.08 [IU]/mL
QuantiFERON TB1 Ag Value: 0.05 [IU]/mL
QuantiFERON TB2 Ag Value: 0.07 [IU]/mL

## 2024-03-14 NOTE — Progress Notes (Signed)
 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 Poor historian and does not have capacity to make decisions as per psych. Still awaiting guardianship. Continue on biktarvy. Continue on atovaquone for PCP ppx.   HIV associated neurocognitive disorder: MRI did not show any enhancing lesions but did show severe cerebral atrophy likely secondary to HIV/AIDS.    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 Continue w/ conservative management as per ortho    Diarrhea: imodium prn. Resolved    Hypotension: continue on midodrine    Social issues: homeless and has polysubstance use disorder (alcohol, cocaine).  Pt's boyfriend/fiance' is not able to visit the pt anymore b/c concern of him bringing the pt alcohol and/or illicit drugs. Awaiting guardianship. No safe d/c plan       DVT prophylaxis:  lovenox Code Status: full  Family Communication:  Disposition Plan: no safe d/c plan. Waiting on guardianship  Level of care: Med-Surg  Status is: Inpatient Remains inpatient appropriate because: medically stable. Still waiting on guardianship and safe d/c plan    Consultants:    Procedures:   Antimicrobials:  Subjective: Pt c/o "wanting to go home" still   Objective: Vitals:   03/13/24 0808 03/13/24 1955 03/14/24 0250 03/14/24 0603  BP: 115/77 120/83 111/78   Pulse: 91 95 100   Resp: 18 20 20    Temp: 98 F (36.7 C) 98.2 F (36.8 C) 98.4 F (36.9 C)   TempSrc:  Oral Oral   SpO2: 100% 100% 100%   Weight:    78.1 kg  Height:        Intake/Output Summary (Last 24 hours)  at 03/14/2024 0817 Last data filed at 03/13/2024 1300 Gross per 24 hour  Intake 480 ml  Output --  Net 480 ml   Filed Weights   03/11/24 0344 03/13/24 0427 03/14/24 0603  Weight: 77.1 kg 77.7 kg 78.1 kg    Examination:  General exam: Appears frustrated  Respiratory system: clear breath sounds b/l  Cardiovascular system: S1 & S2+. No rubs or gallops Gastrointestinal system: abd is soft, NT, ND & hypoactive bowel sounds  Central nervous system: alert & awake Psychiatry: judgement and insight appears poor. Frustrated mood and affect    Data Reviewed: I have personally reviewed following labs and imaging studies  CBC: Recent Labs  Lab 03/12/24 0459  WBC 4.5  HGB 8.9*  HCT 27.4*  MCV 83.8  PLT 252   Basic Metabolic Panel: Recent Labs  Lab 03/12/24 0459  CREATININE 0.71   GFR: Estimated Creatinine Clearance: 93.5 mL/min (by C-G formula based on SCr of 0.71 mg/dL). Liver Function Tests: No results for input(s): "AST", "ALT", "ALKPHOS", "BILITOT", "PROT", "ALBUMIN" in the last 168 hours.  No results for input(s): "LIPASE", "AMYLASE" in the last 168 hours. No results for input(s): "AMMONIA" in the last 168 hours. Coagulation Profile: No results for input(s): "INR", "PROTIME" in the last 168 hours. Cardiac Enzymes: Recent Labs  Lab 03/08/24 0557  CKTOTAL 90   BNP (last 3 results) No results for input(s): "PROBNP" in the last 8760 hours. HbA1C:  No results for input(s): "HGBA1C" in the last 72 hours. CBG: Recent Labs  Lab 03/09/24 0759  GLUCAP 117*   Lipid Profile: No results for input(s): "CHOL", "HDL", "LDLCALC", "TRIG", "CHOLHDL", "LDLDIRECT" in the last 72 hours. Thyroid Function Tests: No results for input(s): "TSH", "T4TOTAL", "FREET4", "T3FREE", "THYROIDAB" in the last 72 hours. Anemia Panel: No results for input(s): "VITAMINB12", "FOLATE", "FERRITIN", "TIBC", "IRON", "RETICCTPCT" in the last 72 hours. Sepsis Labs: No results for input(s):  "PROCALCITON", "LATICACIDVEN" in the last 168 hours.  No results found for this or any previous visit (from the past 240 hours).       Radiology Studies: No results found.      Scheduled Meds:  atovaquone  1,500 mg Oral Q breakfast   azithromycin  1,200 mg Oral Weekly   bictegravir-emtricitabine-tenofovir AF  1 tablet Oral Daily   DULoxetine  30 mg Oral QHS   enoxaparin (LOVENOX) injection  40 mg Subcutaneous QHS   folic acid  1 mg Oral Daily   hydrocerin   Topical BID   lidocaine  1 patch Transdermal Q24H   lipase/protease/amylase  24,000 Units Oral TID AC   midodrine  5 mg Oral TID WC   multivitamin with minerals  1 tablet Oral QHS   pantoprazole  40 mg Oral BID   senna-docusate  2 tablet Oral BID   thiamine  100 mg Oral Daily   Continuous Infusions:   LOS: 97 days       Charise Killian, MD [facility] Pager [phone]  If 7PM-7AM, please contact night-coverage [email or web address] Password [facility] 03/14/2024, 8:17 AM

## 2024-03-14 NOTE — Plan of Care (Signed)
   Problem: Coping: Goal: Ability to adjust to condition or change in health will improve Outcome: Progressing   Problem: Nutritional: Goal: Progress toward achieving an optimal weight will improve Outcome: Progressing

## 2024-03-15 DIAGNOSIS — B20 Human immunodeficiency virus [HIV] disease: Secondary | ICD-10-CM | POA: Diagnosis not present

## 2024-03-15 DIAGNOSIS — F02818 Dementia in other diseases classified elsewhere, unspecified severity, with other behavioral disturbance: Secondary | ICD-10-CM | POA: Diagnosis not present

## 2024-03-15 NOTE — Progress Notes (Signed)
 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 Poor historian and does not have capacity to make decisions as per psych. Still awaiting guardianship. Continue on biktarvy. Continue on atovaquone for PCP ppx   HIV associated neurocognitive disorder: MRI did not show any enhancing lesions but did show severe cerebral atrophy likely secondary to HIV/AIDS.    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 Continue w/ conservative management as per ortho    Diarrhea: imodium prn. Resolved    Hypotension: continue on midodrine    Social issues: homeless and has polysubstance use disorder (alcohol, cocaine).  Pt's boyfriend/fiance' is not able to visit the pt anymore b/c concern of him bringing the pt alcohol and/or illicit drugs. Awaiting guardianship. No safe d/c plan       DVT prophylaxis:  lovenox Code Status: full  Family Communication:  Disposition Plan: no safe d/c plan. Waiting on guardianship  Level of care: Med-Surg  Status is: Inpatient Remains inpatient appropriate because: medically stable. Still waiting on guardianship and safe d/c plan    Consultants:    Procedures:   Antimicrobials:  Subjective: Pt c/o fatigue   Objective: Vitals:   03/14/24 1958 03/15/24 0412 03/15/24 0444 03/15/24 0810  BP: 117/79 114/83  115/85  Pulse: 99 95  94  Resp: 20 20  20   Temp: (!) 97.4 F (36.3 C) 97.9 F (36.6 C)  97.9 F (36.6 C)  TempSrc: Oral     SpO2: 100% 100%  100%  Weight:   78.2 kg   Height:        Intake/Output Summary (Last 24 hours) at 03/15/2024  0850 Last data filed at 03/14/2024 1541 Gross per 24 hour  Intake 240 ml  Output --  Net 240 ml   Filed Weights   03/13/24 0427 03/14/24 0603 03/15/24 0444  Weight: 77.7 kg 78.1 kg 78.2 kg    Examination:  General exam: appears calm  Respiratory system: clear breath sounds b/l Cardiovascular system: S1/S2+. No rubs or clicks  Gastrointestinal system: abd is soft, NT, ND & hypoactive bowel sounds Central nervous system: alert & awake. Moves all extremities  Psychiatry: judgement and insight appears poor. Flat mood and affect    Data Reviewed: I have personally reviewed following labs and imaging studies  CBC: Recent Labs  Lab 03/12/24 0459  WBC 4.5  HGB 8.9*  HCT 27.4*  MCV 83.8  PLT 252   Basic Metabolic Panel: Recent Labs  Lab 03/12/24 0459  CREATININE 0.71   GFR: Estimated Creatinine Clearance: 93.5 mL/min (by C-G formula based on SCr of 0.71 mg/dL). Liver Function Tests: No results for input(s): "AST", "ALT", "ALKPHOS", "BILITOT", "PROT", "ALBUMIN" in the last 168 hours.  No results for input(s): "LIPASE", "AMYLASE" in the last 168 hours. No results for input(s): "AMMONIA" in the last 168 hours. Coagulation Profile: No results for input(s): "INR", "PROTIME" in the last 168 hours. Cardiac Enzymes: No results for input(s): "CKTOTAL", "CKMB", "CKMBINDEX", "TROPONINI" in the last 168 hours.  BNP (last 3 results) No results for input(s): "PROBNP" in the last 8760 hours.  HbA1C: No results for input(s): "HGBA1C" in the last 72 hours. CBG: Recent Labs  Lab 03/09/24 0759  GLUCAP 117*   Lipid Profile: No results for input(s): "CHOL", "HDL", "LDLCALC", "TRIG", "CHOLHDL", "LDLDIRECT" in the last 72 hours. Thyroid Function Tests: No results for input(s): "TSH", "T4TOTAL", "FREET4", "T3FREE", "THYROIDAB" in the last 72 hours. Anemia Panel: No results for input(s): "VITAMINB12", "FOLATE", "FERRITIN", "TIBC", "IRON", "RETICCTPCT" in the last 72 hours. Sepsis  Labs: No results for input(s): "PROCALCITON", "LATICACIDVEN" in the last 168 hours.  No results found for this or any previous visit (from the past 240 hours).       Radiology Studies: No results found.      Scheduled Meds:  atovaquone  1,500 mg Oral Q breakfast   azithromycin  1,200 mg Oral Weekly   bictegravir-emtricitabine-tenofovir AF  1 tablet Oral Daily   DULoxetine  30 mg Oral QHS   enoxaparin (LOVENOX) injection  40 mg Subcutaneous QHS   folic acid  1 mg Oral Daily   hydrocerin   Topical BID   lidocaine  1 patch Transdermal Q24H   lipase/protease/amylase  24,000 Units Oral TID AC   midodrine  5 mg Oral TID WC   multivitamin with minerals  1 tablet Oral QHS   pantoprazole  40 mg Oral BID   senna-docusate  2 tablet Oral BID   thiamine  100 mg Oral Daily   Continuous Infusions:   LOS: 98 days       Charise Killian, MD [facility] Pager [phone]  If 7PM-7AM, please contact night-coverage [email or web address] Password [email or web address] 03/15/2024, 8:50 AM

## 2024-03-15 NOTE — Progress Notes (Signed)
 0730 patient alert x2 able to make most needs known ambulated in room and hall. Patient elopement risk monitored when not in room has tele sitter while in room.

## 2024-03-15 NOTE — Plan of Care (Signed)
  Problem: Coping: Goal: Ability to adjust to condition or change in health will improve Outcome: Progressing   Problem: Fluid Volume: Goal: Ability to maintain a balanced intake and output will improve Outcome: Progressing   Problem: Health Behavior/Discharge Planning: Goal: Ability to identify and utilize available resources and services will improve Outcome: Not Progressing Goal: Ability to manage health-related needs will improve Outcome: Not Progressing   Problem: Metabolic: Goal: Ability to maintain appropriate glucose levels will improve Outcome: Not Progressing   Problem: Nutritional: Goal: Maintenance of adequate nutrition will improve Outcome: Progressing Goal: Progress toward achieving an optimal weight will improve Outcome: Progressing   Problem: Skin Integrity: Goal: Risk for impaired skin integrity will decrease Outcome: Not Progressing   Problem: Tissue Perfusion: Goal: Adequacy of tissue perfusion will improve Outcome: Not Progressing   Problem: Education: Goal: Knowledge of General Education information will improve Description: Including pain rating scale, medication(s)/side effects and non-pharmacologic comfort measures Outcome: Progressing   Problem: Health Behavior/Discharge Planning: Goal: Ability to manage health-related needs will improve Outcome: Progressing   Problem: Clinical Measurements: Goal: Ability to maintain clinical measurements within normal limits will improve Outcome: Progressing Goal: Will remain free from infection Outcome: Progressing Goal: Diagnostic test results will improve Outcome: Progressing Goal: Respiratory complications will improve Outcome: Progressing Goal: Cardiovascular complication will be avoided Outcome: Progressing   Problem: Activity: Goal: Risk for activity intolerance will decrease Outcome: Not Progressing   Problem: Nutrition: Goal: Adequate nutrition will be maintained Outcome: Not Progressing    Problem: Coping: Goal: Level of anxiety will decrease Outcome: Progressing   Problem: Elimination: Goal: Will not experience complications related to bowel motility Outcome: Progressing Goal: Will not experience complications related to urinary retention Outcome: Progressing   Problem: Pain Management: Goal: General experience of comfort will improve Outcome: Not Progressing Note: Patient complains of pain at all the time    Problem: Safety: Goal: Ability to remain free from injury will improve Outcome: Progressing   Problem: Skin Integrity: Goal: Risk for impaired skin integrity will decrease Outcome: Progressing   Problem: Activity: Goal: Ability to tolerate increased activity will improve Outcome: Progressing   Problem: Respiratory: Goal: Ability to maintain a clear airway and adequate ventilation will improve Outcome: Progressing

## 2024-03-16 DIAGNOSIS — F02818 Dementia in other diseases classified elsewhere, unspecified severity, with other behavioral disturbance: Secondary | ICD-10-CM | POA: Diagnosis not present

## 2024-03-16 DIAGNOSIS — B20 Human immunodeficiency virus [HIV] disease: Secondary | ICD-10-CM | POA: Diagnosis not present

## 2024-03-16 NOTE — Plan of Care (Signed)
 Pt alert, c/o abd pain right side, needed something to eat; vital statble; no significant changes through the night. Pt brushed teeth, sleep through the night. Problem: Coping: Goal: Ability to adjust to condition or change in health will improve Outcome: Progressing   Problem: Fluid Volume: Goal: Ability to maintain a balanced intake and output will improve Outcome: Progressing   Problem: Health Behavior/Discharge Planning: Goal: Ability to identify and utilize available resources and services will improve Outcome: Progressing   Problem: Nutritional: Goal: Maintenance of adequate nutrition will improve Outcome: Progressing   Problem: Skin Integrity: Goal: Risk for impaired skin integrity will decrease Outcome: Progressing   Problem: Tissue Perfusion: Goal: Adequacy of tissue perfusion will improve Outcome: Progressing   Problem: Clinical Measurements: Goal: Ability to maintain clinical measurements within normal limits will improve Outcome: Progressing Goal: Will remain free from infection Outcome: Progressing Goal: Diagnostic test results will improve Outcome: Progressing Goal: Respiratory complications will improve Outcome: Progressing   Problem: Activity: Goal: Risk for activity intolerance will decrease Outcome: Progressing   Problem: Nutrition: Goal: Adequate nutrition will be maintained Outcome: Progressing   Problem: Coping: Goal: Level of anxiety will decrease Outcome: Progressing   Problem: Elimination: Goal: Will not experience complications related to bowel motility Outcome: Progressing Goal: Will not experience complications related to urinary retention Outcome: Progressing   Problem: Respiratory: Goal: Ability to maintain a clear airway and adequate ventilation will improve Outcome: Progressing

## 2024-03-16 NOTE — Progress Notes (Signed)
 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 Poor historian and does not have capacity to make decisions as per psych. Still awaiting guardianship. Continue on biktarvy. Continue on atovaquone for PCP ppx  HIV associated neurocognitive disorder: MRI did not show any enhancing lesions but did show severe cerebral atrophy likely secondary to HIV/AIDS.    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 Continue w/ conservative management as per ortho    Diarrhea: imodium prn. Resolved    Hypotension: continue on midodrine     Social issues: homeless and has polysubstance use disorder (alcohol, cocaine).  Pt's boyfriend/fiance' is not able to visit the pt anymore b/c concern of him bringing the pt alcohol and/or illicit drugs. Awaiting guardianship. No safe d/c plan       DVT prophylaxis:  lovenox Code Status: full  Family Communication:  Disposition Plan: no safe d/c plan. Waiting on guardianship  Level of care: Med-Surg  Status is: Inpatient Remains inpatient appropriate because: medically stable. Still waiting on guardianship and safe d/c plan    Consultants:    Procedures:   Antimicrobials:  Subjective: Pt c/o hip pain   Objective: Vitals:   03/15/24 1515 03/15/24 2106 03/16/24 0406 03/16/24 0436  BP: 121/84 106/73 114/83   Pulse: 86 (!) 101 92   Resp: 18 20 20    Temp: 98 F (36.7 C) 98.4 F (36.9 C) 98.4 F (36.9 C)   TempSrc:  Oral Oral   SpO2: 99% 99% 99%   Weight:    78.6 kg  Height:        Intake/Output Summary (Last 24 hours) at 03/16/2024  0815 Last data filed at 03/15/2024 1549 Gross per 24 hour  Intake 480 ml  Output --  Net 480 ml   Filed Weights   03/14/24 0603 03/15/24 0444 03/16/24 0436  Weight: 78.1 kg 78.2 kg 78.6 kg    Examination:  General exam: appears frustrated  Respiratory system: clear breath sounds b/l  Cardiovascular system: S1 & S2+. No rubs or clicks Gastrointestinal system: abd is soft, NT, ND & hypoactive bowel sounds Central nervous system: alert & awake. Moves all extremities  Psychiatry: judgement and insight appears poor. Frustrated mood and affect    Data Reviewed: I have personally reviewed following labs and imaging studies  CBC: Recent Labs  Lab 03/12/24 0459  WBC 4.5  HGB 8.9*  HCT 27.4*  MCV 83.8  PLT 252   Basic Metabolic Panel: Recent Labs  Lab 03/12/24 0459  CREATININE 0.71   GFR: Estimated Creatinine Clearance: 93.7 mL/min (by C-G formula based on SCr of 0.71 mg/dL). Liver Function Tests: No results for input(s): "AST", "ALT", "ALKPHOS", "BILITOT", "PROT", "ALBUMIN" in the last 168 hours.  No results for input(s): "LIPASE", "AMYLASE" in the last 168 hours. No results for input(s): "AMMONIA" in the last 168 hours. Coagulation Profile: No results for input(s): "INR", "PROTIME" in the last 168 hours. Cardiac Enzymes: No results for input(s): "CKTOTAL", "CKMB", "CKMBINDEX", "TROPONINI" in the last 168 hours.  BNP (last 3 results) No results for input(s): "PROBNP" in the  last 8760 hours. HbA1C: No results for input(s): "HGBA1C" in the last 72 hours. CBG: No results for input(s): "GLUCAP" in the last 168 hours.  Lipid Profile: No results for input(s): "CHOL", "HDL", "LDLCALC", "TRIG", "CHOLHDL", "LDLDIRECT" in the last 72 hours. Thyroid Function Tests: No results for input(s): "TSH", "T4TOTAL", "FREET4", "T3FREE", "THYROIDAB" in the last 72 hours. Anemia Panel: No results for input(s): "VITAMINB12", "FOLATE", "FERRITIN", "TIBC", "IRON", "RETICCTPCT" in the last  72 hours. Sepsis Labs: No results for input(s): "PROCALCITON", "LATICACIDVEN" in the last 168 hours.  No results found for this or any previous visit (from the past 240 hours).       Radiology Studies: No results found.      Scheduled Meds:  atovaquone  1,500 mg Oral Q breakfast   azithromycin  1,200 mg Oral Weekly   bictegravir-emtricitabine-tenofovir AF  1 tablet Oral Daily   DULoxetine  30 mg Oral QHS   enoxaparin (LOVENOX) injection  40 mg Subcutaneous QHS   folic acid  1 mg Oral Daily   hydrocerin   Topical BID   lidocaine  1 patch Transdermal Q24H   lipase/protease/amylase  24,000 Units Oral TID AC   midodrine  5 mg Oral TID WC   multivitamin with minerals  1 tablet Oral QHS   pantoprazole  40 mg Oral BID   senna-docusate  2 tablet Oral BID   thiamine  100 mg Oral Daily   Continuous Infusions:   LOS: 99 days       Charise Killian, MD [facility] Pager [phone]  If 7PM-7AM, please contact night-coverage [email or web address] Password [email or web address] 03/16/2024, 8:15 AM

## 2024-03-17 DIAGNOSIS — F02818 Dementia in other diseases classified elsewhere, unspecified severity, with other behavioral disturbance: Secondary | ICD-10-CM | POA: Diagnosis not present

## 2024-03-17 DIAGNOSIS — B20 Human immunodeficiency virus [HIV] disease: Secondary | ICD-10-CM | POA: Diagnosis not present

## 2024-03-17 LAB — BASIC METABOLIC PANEL WITH GFR
Anion gap: 6 (ref 5–15)
BUN: 12 mg/dL (ref 6–20)
CO2: 24 mmol/L (ref 22–32)
Calcium: 8.8 mg/dL — ABNORMAL LOW (ref 8.9–10.3)
Chloride: 109 mmol/L (ref 98–111)
Creatinine, Ser: 0.66 mg/dL (ref 0.44–1.00)
GFR, Estimated: >60 mL/min (ref >60)
Glucose, Bld: 92 mg/dL (ref 70–99)
Potassium: 3.7 mmol/L (ref 3.5–5.1)
Sodium: 139 mmol/L (ref 135–145)

## 2024-03-17 LAB — CBC
HCT: 28.4 % — ABNORMAL LOW (ref 36.0–46.0)
Hemoglobin: 9.2 g/dL — ABNORMAL LOW (ref 12.0–15.0)
MCH: 27.3 pg (ref 26.0–34.0)
MCHC: 32.4 g/dL (ref 30.0–36.0)
MCV: 84.3 fL (ref 80.0–100.0)
Platelets: 246 10*3/uL (ref 150–400)
RBC: 3.37 MIL/uL — ABNORMAL LOW (ref 3.87–5.11)
RDW: 14.9 % (ref 11.5–15.5)
WBC: 4.7 10*3/uL (ref 4.0–10.5)
nRBC: 0 % (ref 0.0–0.2)

## 2024-03-17 NOTE — Consult Note (Signed)
 [facility] Psychiatric Consult Follow up  Patient Name: .Meghan Jarvis  MRN: [id number]  DOB: [birth year]  Consult Order details:  Orders (From admission, onward)     Start     Ordered   12/24/23 0739  IP CONSULT TO PSYCHIATRY       Ordering Provider: Debarah Crape, DO  Provider:  (Not yet assigned)  Question Answer Comment  Location [facility]   Reason for Consult? capacity assessment      12/24/23 0738             Mode of Visit: In person, I spent 45 min on this consult.     Psychiatry Consult Evaluation  Service Date: March 17, 2024 LOS:  LOS: 100 days  Chief Complaint "I don't know what I am going to do after I leave the hospital".   Primary Psychiatric Diagnoses  Major neurocognitive disorder  Assessment  KARISHMA UNREIN is a 48 y.o. female admitted: Medicallyfor 12/08/2023  9:57 PM for abdominal pain. She carries the psychiatric diagnoses of substance use and has a past medical history of HIV, acute metabolic encephalopathy, pyelonephritis, hypertension, migraine, atrial fibrillation.   Her current presentation of encounter for psychiatric assessment is requested by the attending to assess capacity.  On today's assessment patient is unable to identify and discuss her medical problems unable to give details about her treatments that she received in the hospital.  Patient's cognitive status has declined since January 2025 to January 28, 2024 when the Last assessment showed patient lacks capacity.  Even today's assessment reflects patient's lacking capacity due to major neurocognitive disorder.  Recommend to continue with legal guardianship and further assistance. 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  Patient appears to be unable to recall the medical problems that she is presenting with and states that she is unaware and believes that the doctors are incorrect and the diagnoses for example her HIV in which she states that the medical doctors got all wrong.   she does not reflect appreciation in her care due not understanding because nor the treatment plan for current medical problems.when asked about expressing a choice in her medical care it seems that she becomes easily frustrated and is has poor attention in which she gets easily distracted when asked about her decisions and her care for example being asked what she would do if she ran out of medications she was unable to answer to questions in which she became distracted stating that she will find a place to live and I will provide all of her meds.  She seems to be only aware of her current situation and is able to have a conversation with the provider but unable to logically think deeper in her medical needs.  Patient is lacking and reasoning in her medical care for example she is unable to describe the consequence that she will have if she does not take her medications or has complications with her chronic conditions.  On assessments dated on 01/28/2024 and/7/25 patient continues to display lacking capacity to make  medical decisions.  The [place] cognitive assessment was conducted with the score 13 out of 30 on 01/28/24  ## Further Work-up:  -- No further workup recommended   ## Disposition:-- There are no psychiatric contraindications to discharge at this time.   ## Behavioral / Environmental: - No specific recommendations at this time.    Thank you for this consult request. Recommendations have been communicated to the primary team.   We will clear the patient from psych, with Capacity screening completed at this time.   Verner Chol, MD       History of Present Illness   48 year old female requested for capacity screening by the attending, patient lacks insight into her medical and mental health problems and continues to say that she needs to get out of the hospital and she wants to go home.  She was oriented to month and the year but not to the president.  .  When asked about her medical problems patient reports that she is in the hospital because of her drug use and that she is okay to do some rehabs later when she goes home.  She reports that she has a fianc and he will take care of her.  When asked about the last time she saw him her fianc she was unable to recall.  When asked about any medical problems related to her heart lungs, HIV etc. she is unable to acknowledge any of the problems.  She is unable to recall any of her medications and how to take them.  She remains very impulsive stating she will figure out how to take care of herself when she goes home.  She denies feeling depressed, anxious.  She denies hopelessness and worthlessness.  She denies suicidal/homicidal ideation/intent/plan.  She denies auditory/visual hallucinations.  She reports fair appetite and sleep.  Psych ROS:  Depression:negative Anxiety:  negatives Mania (lifetime and current): denies Psychosis: (lifetime and current): denies   Review of Systems  Constitutional:  Positive for malaise/fatigue.  HENT: Negative.    Eyes: Negative.   Respiratory: Negative.    Cardiovascular: Negative.   Genitourinary: Negative.   Neurological: Negative.   Endo/Heme/Allergies: Negative.      Psychiatric and Social History  Psychiatric History:  Information collected from Pt  Prev Dx/Sx: Substance use Current Psych Provider: Denies Home Meds (current): See Attending notes Family Psych History: Denies Family Hx suicide: Denies  Social History:    Living Situation: Homeless Spiritual Hx: Denies Access to weapons/lethal means: Denies   Substance History Alcohol: Endorses, 3 or more drinks a day  Tobacco: Denies Illicit drugs: Cocaine   Exam Findings   Vital Signs:  Temp:  [97.4 F (36.3 C)-98.7 F (37.1 C)] 97.4 F (36.3 C) (04/07 2002) Pulse Rate:  [83-99] 99 (04/07 2002) Resp:  [16-19] 16 (04/07 2002) BP: (109-118)/(69-80) 118/77 (04/07 2002) SpO2:  [99 %-100 %] 99 % (04/07 2002) Weight:  [75.5 kg] 75.5 kg (04/07 0329) Blood pressure 118/77, pulse 99, temperature (!) 97.4 F (36.3 C), temperature source Oral, resp. rate 16, height 5' 7.99" (1.727 m), weight 75.5 kg, SpO2 99%. Body mass index is 25.31 kg/m.  Physical Exam HENT:     Head: Normocephalic.     Mouth/Throat:     Mouth: Mucous membranes are moist.  Cardiovascular:     Rate and Rhythm: Normal rate.  Pulmonary:     Effort: Pulmonary effort is normal.  Musculoskeletal:     Cervical back: Normal range of motion.  Skin:  General: Skin is warm and dry.  Neurological:     Mental Status: She is alert.     Mental Status Exam: General Appearance: Casual and Disheveled  Orientation:  Full (Time, Place, and Person)  Memory:  Immediate;   Good Recent;   Good Remote;   Good  Concentration:  Concentration: Good and Attention Span: Good  Recall:  Good  Attention  Fair  Eye Contact:  Good  Speech:  Clear and Coherent  Language:  Good  Volume:  Normal  Mood: fine  Affect:  euthymic  Thought Process:  Coherent  Thought Content:  Logical  Suicidal Thoughts:  No  Homicidal Thoughts:  No  Judgement:  Good  Insight:  Good  Psychomotor Activity:  Normal  Akathisia:  No  Fund of Knowledge:  Good      Assets:  Others:  None identifies.   Cognition:  WNL  ADL's:  Intact  AIMS (if indicated):        Other History   These have been pulled in through the EMR, reviewed, and updated if appropriate.  Family History:  The patient's Family history  is unknown by patient.  Medical History: Past Medical History:  Diagnosis Date   Asthma    Depression    HIV (human immunodeficiency virus infection) (HCC)     Surgical History: Past Surgical History:  Procedure Laterality Date   ABDOMINAL HYSTERECTOMY     APPENDECTOMY       Medications:   Current Facility-Administered Medications:    acetaminophen (TYLENOL) tablet 650 mg, 650 mg, Oral, Q6H PRN, Lurene Shadow, MD, 650 mg at 03/17/24 0325   atovaquone (MEPRON) 750 MG/5ML suspension 1,500 mg, 1,500 mg, Oral, Q breakfast, Ravishankar, Jayashree, MD, 1,500 mg at 03/17/24 0917   azithromycin (ZITHROMAX) tablet 1,200 mg, 1,200 mg, Oral, Weekly, Aleda Grana, RPH, 1,200 mg at 03/12/24 0817   bictegravir-emtricitabine-tenofovir AF (BIKTARVY) 50-200-25 MG per tablet 1 tablet, 1 tablet, Oral, Daily, Ravishankar, Rhodia Albright, MD, 1 tablet at 03/17/24 0917   cyclobenzaprine (FLEXERIL) tablet 5 mg, 5 mg, Oral, TID PRN, Alford Highland, MD, 5 mg at 03/17/24 1125   dicyclomine (BENTYL) tablet 20 mg, 20 mg, Oral, TID PRN, Sunnie Nielsen, DO, 20 mg at 03/02/24 2131   diphenhydrAMINE (BENADRYL) capsule 25 mg, 25 mg, Oral, Q8H PRN, Dezii, Alexandra, DO, 25 mg at 02/29/24 2339   DULoxetine (CYMBALTA) DR capsule 30 mg, 30 mg, Oral, QHS, Dezii, Alexandra, DO, 30 mg at 03/17/24 2100   enoxaparin (LOVENOX) injection 40 mg, 40 mg, Subcutaneous, QHS, Aleskerov, Fuad, MD, 40 mg at 03/17/24 2101   folic acid (FOLVITE) tablet 1 mg, 1 mg, Oral, Daily, Karna Christmas, Fuad, MD, 1 mg at 03/17/24 8295   hydrocerin (EUCERIN) cream, , Topical, BID, Alford Highland, MD, Given at 03/17/24 2106   hydrocortisone (ANUSOL-HC) 2.5 % rectal cream, , Rectal, QID PRN, Sunnie Nielsen, DO, Given at 02/05/24 2106   ibuprofen (ADVIL) tablet 400 mg, 400 mg, Oral, Q6H PRN, Lurene Shadow, MD, 400 mg at 03/17/24 2117   ipratropium-albuterol (DUONEB) 0.5-2.5 (3) MG/3ML nebulizer solution 3 mL, 3 mL, Nebulization, Q6H PRN,  Wieting, Richard, MD   lactulose (CHRONULAC) 10 GM/15ML solution 20 g, 20 g, Oral, Daily PRN, Arnetha Courser, MD   lidocaine (LIDODERM) 5 % 1 patch, 1 patch, Transdermal, Q24H, Wieting, Richard, MD, 1 patch at 03/17/24 1908   lipase/protease/amylase (CREON) capsule 24,000 Units, 24,000 Units, Oral, TID Valentino Hue, MD, 24,000 Units at 03/17/24 1909   loperamide (IMODIUM) capsule 4  mg, 4 mg, Oral, PRN, Sunnie Nielsen, DO, 4 mg at 02/17/24 1833   midodrine (PROAMATINE) tablet 5 mg, 5 mg, Oral, TID WC, Marrion Coy, MD, 5 mg at 03/17/24 1909   multivitamin with minerals tablet 1 tablet, 1 tablet, Oral, QHS, Aleda Grana, RPH, 1 tablet at 03/17/24 2059   nicotine polacrilex (NICORETTE) gum 2 mg, 2 mg, Oral, PRN, Manuela Schwartz, NP   ondansetron (ZOFRAN-ODT) disintegrating tablet 4 mg, 4 mg, Oral, Q6H PRN, Loyce Dys, MD, 4 mg at 03/09/24 2956   Oral care mouth rinse, 15 mL, Mouth Rinse, PRN, Jonah Blue, MD   pantoprazole (PROTONIX) EC tablet 40 mg, 40 mg, Oral, BID, Renae Gloss, Richard, MD, 40 mg at 03/17/24 2100   polyethylene glycol (MIRALAX / GLYCOLAX) packet 17 g, 17 g, Oral, Daily PRN, Alford Highland, MD, 17 g at 01/28/24 0959   senna-docusate (Senokot-S) tablet 2 tablet, 2 tablet, Oral, BID, Marrion Coy, MD, 2 tablet at 03/17/24 2130   thiamine (VITAMIN B1) tablet 100 mg, 100 mg, Oral, Daily, Vida Rigger, MD, 100 mg at 03/17/24 8657  Allergies: Allergies  Allergen Reactions   Fish Allergy Other (See Comments)    Crab legs result in itching  Crab legs result in itching  Crab legs result in itching   Shellfish Allergy Other (See Comments)    Crab legs result in itching   Buprenorphine Hcl     Pt states she not allergic   Morphine And Codeine Itching    hives   Sulfa Antibiotics Rash    Verner Chol, MD

## 2024-03-17 NOTE — Progress Notes (Signed)
 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 Poor historian and does not have capacity to make decisions as per psych. Still waiting on guardianship. Continue on biktarvy. Continue on atovaquone for PCP ppx   HIV associated neurocognitive disorder: MRI did not show any enhancing lesions but did show severe cerebral atrophy likely secondary to HIV/AIDS.    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 Continue w/ conservative management as per ortho  Diarrhea: imodium prn. Resolved    Hypotension: continue on midodrine    Social issues: homeless and has polysubstance use disorder (alcohol, cocaine).  Pt's boyfriend/fiance' is not able to visit the pt anymore b/c concern of him bringing the pt alcohol and/or illicit drugs. Awaiting guardianship still. No safe d/c plan       DVT prophylaxis:  lovenox Code Status: full  Family Communication:  Disposition Plan: no safe d/c plan. Waiting on guardianship  Level of care: Med-Surg  Status is: Inpatient Remains inpatient appropriate because: medically stable. Still waiting on guardianship and safe d/c plan    Consultants:    Procedures:   Antimicrobials:  Subjective: Pt c/o wanting to go "home."  Objective: Vitals:   03/16/24 1755 03/16/24 2235 03/17/24 0324 03/17/24 0329  BP: 115/80 111/79 109/69   Pulse: 93 83 94   Resp: 18 19 18    Temp:  97.8 F (36.6 C) 98.3 F (36.8 C)   TempSrc:  Oral Oral   SpO2: 98% 99% 99%   Weight:    75.5 kg  Height:       No intake or output data in the 24 hours ending 03/17/24  0831  Filed Weights   03/15/24 0444 03/16/24 0436 03/17/24 0329  Weight: 78.2 kg 78.6 kg 75.5 kg    Examination:  General exam: appears calm Respiratory system: clear breath sounds b/l  Cardiovascular system: S1/S2+. No rubs or gallops  Gastrointestinal system: abd is soft, NT, ND & normal bowel sounds  Central nervous system: alert & awake. Moves all extremities  Psychiatry: judgement and insight appears poor. Flat mood and affect    Data Reviewed: I have personally reviewed following labs and imaging studies  CBC: Recent Labs  Lab 03/12/24 0459 03/17/24 0525  WBC 4.5 4.7  HGB 8.9* 9.2*  HCT 27.4* 28.4*  MCV 83.8 84.3  PLT 252 246   Basic Metabolic Panel: Recent Labs  Lab 03/12/24 0459 03/17/24 0525  NA  --  139  K  --  3.7  CL  --  109  CO2  --  24  GLUCOSE  --  92  BUN  --  12  CREATININE 0.71 0.66  CALCIUM  --  8.8*   GFR: Estimated Creatinine Clearance: 85.8 mL/min (by C-G formula based on SCr of 0.66 mg/dL). Liver Function Tests: No results for input(s): "AST", "ALT", "ALKPHOS", "BILITOT", "PROT", "ALBUMIN" in the last 168 hours.  No results for input(s): "LIPASE", "AMYLASE" in the last 168 hours. No results for input(s): "AMMONIA" in the last 168 hours. Coagulation Profile: No results for input(s): "INR", "PROTIME" in the last  168 hours. Cardiac Enzymes: No results for input(s): "CKTOTAL", "CKMB", "CKMBINDEX", "TROPONINI" in the last 168 hours.  BNP (last 3 results) No results for input(s): "PROBNP" in the last 8760 hours. HbA1C: No results for input(s): "HGBA1C" in the last 72 hours. CBG: No results for input(s): "GLUCAP" in the last 168 hours.  Lipid Profile: No results for input(s): "CHOL", "HDL", "LDLCALC", "TRIG", "CHOLHDL", "LDLDIRECT" in the last 72 hours. Thyroid Function Tests: No results for input(s): "TSH", "T4TOTAL", "FREET4", "T3FREE", "THYROIDAB" in the last 72 hours. Anemia Panel: No results for input(s): "VITAMINB12",  "FOLATE", "FERRITIN", "TIBC", "IRON", "RETICCTPCT" in the last 72 hours. Sepsis Labs: No results for input(s): "PROCALCITON", "LATICACIDVEN" in the last 168 hours.  No results found for this or any previous visit (from the past 240 hours).       Radiology Studies: No results found.      Scheduled Meds:  atovaquone  1,500 mg Oral Q breakfast   azithromycin  1,200 mg Oral Weekly   bictegravir-emtricitabine-tenofovir AF  1 tablet Oral Daily   DULoxetine  30 mg Oral QHS   enoxaparin (LOVENOX) injection  40 mg Subcutaneous QHS   folic acid  1 mg Oral Daily   hydrocerin   Topical BID   lidocaine  1 patch Transdermal Q24H   lipase/protease/amylase  24,000 Units Oral TID AC   midodrine  5 mg Oral TID WC   multivitamin with minerals  1 tablet Oral QHS   pantoprazole  40 mg Oral BID   senna-docusate  2 tablet Oral BID   thiamine  100 mg Oral Daily   Continuous Infusions:   LOS: 100 days       Charise Killian, MD [facility] Pager [phone]  If 7PM-7AM, please contact night-coverage [email or web address] Password [facility] 03/17/2024, 8:31 AM

## 2024-03-17 NOTE — Plan of Care (Signed)
  Problem: Coping: Goal: Ability to adjust to condition or change in health will improve Outcome: Progressing   Problem: Fluid Volume: Goal: Ability to maintain a balanced intake and output will improve Outcome: Progressing   Problem: Health Behavior/Discharge Planning: Goal: Ability to identify and utilize available resources and services will improve Outcome: Progressing Goal: Ability to manage health-related needs will improve Outcome: Progressing   Problem: Metabolic: Goal: Ability to maintain appropriate glucose levels will improve Outcome: Progressing   Problem: Nutritional: Goal: Maintenance of adequate nutrition will improve Outcome: Progressing Goal: Progress toward achieving an optimal weight will improve Outcome: Progressing   Problem: Skin Integrity: Goal: Risk for impaired skin integrity will decrease Outcome: Progressing   Problem: Tissue Perfusion: Goal: Adequacy of tissue perfusion will improve Outcome: Progressing   Problem: Education: Goal: Knowledge of General Education information will improve Description: Including pain rating scale, medication(s)/side effects and non-pharmacologic comfort measures Outcome: Progressing   Problem: Health Behavior/Discharge Planning: Goal: Ability to manage health-related needs will improve Outcome: Progressing   Problem: Clinical Measurements: Goal: Ability to maintain clinical measurements within normal limits will improve Outcome: Progressing Goal: Will remain free from infection Outcome: Progressing Goal: Diagnostic test results will improve Outcome: Progressing Goal: Respiratory complications will improve Outcome: Progressing Goal: Cardiovascular complication will be avoided Outcome: Progressing   Problem: Activity: Goal: Risk for activity intolerance will decrease Outcome: Progressing   Problem: Nutrition: Goal: Adequate nutrition will be maintained Outcome: Progressing   Problem: Coping: Goal:  Level of anxiety will decrease Outcome: Progressing   Problem: Elimination: Goal: Will not experience complications related to bowel motility Outcome: Progressing Goal: Will not experience complications related to urinary retention Outcome: Progressing   Problem: Pain Management: Goal: General experience of comfort will improve Outcome: Progressing   Problem: Safety: Goal: Ability to remain free from injury will improve Outcome: Progressing   Problem: Skin Integrity: Goal: Risk for impaired skin integrity will decrease Outcome: Progressing   Problem: Activity: Goal: Ability to tolerate increased activity will improve Outcome: Progressing   Problem: Respiratory: Goal: Ability to maintain a clear airway and adequate ventilation will improve Outcome: Progressing

## 2024-03-17 NOTE — TOC Progression Note (Addendum)
 Transition of Care [facility]) - Progression Note    Patient Details  Name: Meghan Jarvis MRN: [id number] Date of Birth: November 12, 1976  [facility] CM/SW Contact  Margarito Liner, LCSW Phone Number: 03/17/2024, 3:46 PM  Clinical Narrative:   CSW left voicemail for patient's assigned DSS social worker.  4:09 pm: Date for court hearing is pending. DSS social worker asked for a more recent capacity evaluation. Per MD, psych saw her today.  Expected Discharge Plan and Services         Expected Discharge Date: 01/10/24                                     Social Determinants of Health (SDOH) Interventions SDOH Screenings   Food Insecurity: Patient Unable To Answer (12/09/2023)  Recent Concern: Food Insecurity - Food Insecurity Present (09/26/2023)  Housing: Patient Unable To Answer (12/09/2023)  Recent Concern: Housing - Medium Risk (09/26/2023)  Transportation Needs: Patient Unable To Answer (12/09/2023)  Recent Concern: Transportation Needs - Unmet Transportation Needs (10/11/2023)  Utilities: Patient Unable To Answer (12/09/2023)  Recent Concern: Utilities - At Risk (09/25/2023)  Tobacco Use: High Risk (12/08/2023)    Readmission Risk Interventions     No data to display

## 2024-03-18 ENCOUNTER — Inpatient Hospital Stay: Payer: MEDICAID | Admitting: Infectious Diseases

## 2024-03-18 DIAGNOSIS — B20 Human immunodeficiency virus [HIV] disease: Secondary | ICD-10-CM | POA: Diagnosis not present

## 2024-03-18 DIAGNOSIS — F02818 Dementia in other diseases classified elsewhere, unspecified severity, with other behavioral disturbance: Secondary | ICD-10-CM | POA: Diagnosis not present

## 2024-03-18 NOTE — Plan of Care (Signed)
  Problem: Coping: Goal: Ability to adjust to condition or change in health will improve Outcome: Progressing   Problem: Fluid Volume: Goal: Ability to maintain a balanced intake and output will improve Outcome: Progressing   Problem: Health Behavior/Discharge Planning: Goal: Ability to identify and utilize available resources and services will improve Outcome: Progressing Goal: Ability to manage health-related needs will improve Outcome: Progressing   Problem: Metabolic: Goal: Ability to maintain appropriate glucose levels will improve Outcome: Progressing   Problem: Skin Integrity: Goal: Risk for impaired skin integrity will decrease Outcome: Progressing   Problem: Tissue Perfusion: Goal: Adequacy of tissue perfusion will improve Outcome: Progressing   Problem: Clinical Measurements: Goal: Ability to maintain clinical measurements within normal limits will improve Outcome: Progressing Goal: Will remain free from infection Outcome: Progressing Goal: Diagnostic test results will improve Outcome: Progressing Goal: Respiratory complications will improve Outcome: Progressing Goal: Cardiovascular complication will be avoided Outcome: Progressing   Problem: Nutrition: Goal: Adequate nutrition will be maintained Outcome: Progressing   Problem: Activity: Goal: Risk for activity intolerance will decrease Outcome: Progressing   Problem: Coping: Goal: Level of anxiety will decrease Outcome: Progressing   Problem: Elimination: Goal: Will not experience complications related to bowel motility Outcome: Progressing Goal: Will not experience complications related to urinary retention Outcome: Progressing

## 2024-03-18 NOTE — Progress Notes (Signed)
 PROGRESS NOTE   HPI was taken from NP [name]: 48 y.o with significant PMH of EtOH abuse ,Cocaine use, Homelessness, Uncontrolled HIV, on BIKTARVY, A-fib, GERD, Hypothyroidism, Diabetes, who presented to the ED with chief complaints of unresponsiveness.   Per ED reports, EMS report that patient was boarding at a friend's house who noticed that she had not woken up the whole day. Patient's friend attempted to wake her up but noticed that she was unresponsive and making "gurgling" sound so they called EMS. On EMS arrival, patient was found to be unconscious and incontinent of urine and feces.     ED Course: Initial vital signs showed HR of 118 beats/minute, BP 90/60 mm Hg, the RR 25 breaths/minute, and the oxygen saturation 98% on NRB and a temperature of 100.70F. Patient was obtunded with no gag reflex with debris around the mouth and nose concerning for stomach contents. She was emergently intubated for airway protection. Pertinent Labs/Diagnostics Findings: Na+/ K+:140/2.6  Glucose: 117 BUN/Cr.:33/1.18 AST/ALT:45/17 WBC:9.6 K/L PCT: 2.2 Lactic acid: 1.1 COVID PCR: Negative,  ABG: pO2 139; pCO2 27; pH 7.51;  HCO3 21.5, %O2 Sat 100.  CXR> see full report  Medication administered in the ED: Patient given 30 cc/kg of fluids and started on broad-spectrum antibiotics Ceftriaxone and Azithromycin for suspected sepsis Disposition:ICU  As per Dr. Mayford Knife 4/2-03/18/24: Pt has remained medically stable. Pt still lacks capacity to make medical decisions for herself as per psych. Guardianship is pending still. Unsafe d/c plan.    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 Poor historian and does not have capacity to make decisions as per psych. Still waiting on guardianship. Continue on biktarvy. Continue on atovaquone for PCP ppx   HIV associated neurocognitive disorder: MRI did not show any enhancing lesions but did show severe cerebral atrophy likely secondary to HIV/AIDS.    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 Continue w/ conservative management as per ortho surg   Diarrhea: imodium prn. Resolved    Hypotension: continue on midodrine    Social issues: homeless and has polysubstance use disorder (alcohol, cocaine).  Pt's boyfriend/fiance' is not able to visit the pt anymore b/c concern of him bringing the pt alcohol and/or illicit drugs. Awaiting guardianship still. Unsafe d/c plan       DVT prophylaxis:  lovenox Code Status: full  Family Communication:  Disposition Plan: no safe d/c plan. Waiting on guardianship  Level of care: Med-Surg  Status is: Inpatient Remains inpatient appropriate because: medically stable. Still waiting on guardianship and safe d/c plan    Consultants:    Procedures:   Antimicrobials:  Subjective: Pt c/o hip pain  Objective: Vitals:   03/17/24 0935 03/17/24 2002 03/18/24 0034 03/18/24 0409  BP: 114/80 118/77  113/76  Pulse: 96 99  91  Resp: 18 16  18   Temp: 98.7 F (37.1 C) (!) 97.4 F (36.3 C)  97.9 F (36.6 C)  TempSrc: Oral Oral    SpO2: 100% 99%  100%  Weight:   80.7 kg   Height:        Intake/Output Summary (  Last 24 hours) at 03/18/2024 0820 Last data filed at 03/17/2024 2100 Gross per 24 hour  Intake 480 ml  Output --  Net 480 ml    Filed Weights   03/16/24 0436 03/17/24 0329 03/18/24 0034  Weight: 78.6 kg 75.5 kg 80.7 kg    Examination:  General exam: appears calm & comfortable  Respiratory system: clear breath sounds b/l  Cardiovascular system: S1 & S2+. No rubs or gallops  Gastrointestinal system: abd  is soft, NT, ND & hypoactive bowel sounds Central nervous system: alert & awake. Moves all extremities  Psychiatry: judgement and insight appears at baseline. Flat mood and affect    Data Reviewed: I have personally reviewed following labs and imaging studies  CBC: Recent Labs  Lab 03/12/24 0459 03/17/24 0525  WBC 4.5 4.7  HGB 8.9* 9.2*  HCT 27.4* 28.4*  MCV 83.8 84.3  PLT 252 246   Basic Metabolic Panel: Recent Labs  Lab 03/12/24 0459 03/17/24 0525  NA  --  139  K  --  3.7  CL  --  109  CO2  --  24  GLUCOSE  --  92  BUN  --  12  CREATININE 0.71 0.66  CALCIUM  --  8.8*   GFR: Estimated Creatinine Clearance: 94.8 mL/min (by C-G formula based on SCr of 0.66 mg/dL). Liver Function Tests: No results for input(s): "AST", "ALT", "ALKPHOS", "BILITOT", "PROT", "ALBUMIN" in the last 168 hours.  No results for input(s): "LIPASE", "AMYLASE" in the last 168 hours. No results for input(s): "AMMONIA" in the last 168 hours. Coagulation Profile: No results for input(s): "INR", "PROTIME" in the last 168 hours. Cardiac Enzymes: No results for input(s): "CKTOTAL", "CKMB", "CKMBINDEX", "TROPONINI" in the last 168 hours.  BNP (last 3 results) No results for input(s): "PROBNP" in the last 8760 hours. HbA1C: No results for input(s): "HGBA1C" in the last 72 hours. CBG: No results for input(s): "GLUCAP" in the last 168 hours.  Lipid Profile: No results for input(s): "CHOL", "HDL", "LDLCALC", "TRIG", "CHOLHDL", "LDLDIRECT" in the last 72 hours. Thyroid Function Tests: No results for input(s): "TSH", "T4TOTAL", "FREET4", "T3FREE", "THYROIDAB" in the last 72 hours. Anemia Panel: No results for input(s): "VITAMINB12", "FOLATE", "FERRITIN", "TIBC", "IRON", "RETICCTPCT" in the last 72 hours. Sepsis Labs: No results for input(s): "PROCALCITON", "LATICACIDVEN" in the last 168 hours.  No results found for this or any previous visit (from the past 240 hours).       Radiology  Studies: No results found.      Scheduled Meds:  atovaquone  1,500 mg Oral Q breakfast   azithromycin  1,200 mg Oral Weekly   bictegravir-emtricitabine-tenofovir AF  1 tablet Oral Daily   DULoxetine  30 mg Oral QHS   enoxaparin (LOVENOX) injection  40 mg Subcutaneous QHS   folic acid  1 mg Oral Daily   hydrocerin   Topical BID   lidocaine  1 patch Transdermal Q24H   lipase/protease/amylase  24,000 Units Oral TID AC   midodrine  5 mg Oral TID WC   multivitamin with minerals  1 tablet Oral QHS   pantoprazole  40 mg Oral BID   senna-docusate  2 tablet Oral BID   thiamine  100 mg Oral Daily   Continuous Infusions:   LOS: 101 days       Charise Killian, MD [facility] Pager [phone]  If 7PM-7AM, please contact night-coverage [email or web address] Password [email or web address] 03/18/2024, 8:20 AM

## 2024-03-18 NOTE — Plan of Care (Signed)
  Problem: Coping: Goal: Level of anxiety will decrease 03/18/2024 0550 by Cornelia Copa, RN Outcome: Progressing 03/18/2024 0550 by Cornelia Copa, RN Outcome: Progressing 03/17/2024 2328 by Cornelia Copa, RN Outcome: Progressing   Problem: Elimination: Goal: Will not experience complications related to bowel motility 03/18/2024 0550 by Cornelia Copa, RN Outcome: Progressing 03/18/2024 0550 by Cornelia Copa, RN Outcome: Progressing 03/17/2024 2328 by Cornelia Copa, RN Outcome: Progressing   Problem: Activity: Goal: Risk for activity intolerance will decrease 03/18/2024 0550 by Cornelia Copa, RN Outcome: Progressing 03/18/2024 0550 by Cornelia Copa, RN Outcome: Progressing 03/17/2024 2328 by Cornelia Copa, RN Outcome: Progressing   Problem: Clinical Measurements: Goal: Will remain free from infection 03/18/2024 0550 by Cornelia Copa, RN Outcome: Progressing 03/18/2024 0550 by Cornelia Copa, RN Outcome: Progressing 03/17/2024 2328 by Cornelia Copa, RN Outcome: Progressing

## 2024-03-18 NOTE — TOC Progression Note (Signed)
 Transition of Care [facility]) - Progression Note    Patient Details  Name: Meghan Jarvis MRN: [id number] Date of Birth: 03-13-1976  Transition of Care [facility]) CM/SW Contact  Margarito Liner, LCSW Phone Number: 03/18/2024, 11:00 AM  Clinical Narrative:   CSW faxed yesterday's psych note to [facility] social worker.  Expected Discharge Plan and Services         Expected Discharge Date: 01/10/24                                     Social Determinants of Health (SDOH) Interventions SDOH Screenings   Food Insecurity: Patient Unable To Answer (12/09/2023)  Recent Concern: Food Insecurity - Food Insecurity Present (09/26/2023)  Housing: Patient Unable To Answer (12/09/2023)  Recent Concern: Housing - Medium Risk (09/26/2023)  Transportation Needs: Patient Unable To Answer (12/09/2023)  Recent Concern: Transportation Needs - Unmet Transportation Needs (10/11/2023)  Utilities: Patient Unable To Answer (12/09/2023)  Recent Concern: Utilities - At Risk (09/25/2023)  Tobacco Use: High Risk (12/08/2023)    Readmission Risk Interventions     No data to display

## 2024-03-19 DIAGNOSIS — F02818 Dementia in other diseases classified elsewhere, unspecified severity, with other behavioral disturbance: Secondary | ICD-10-CM | POA: Diagnosis not present

## 2024-03-19 DIAGNOSIS — B20 Human immunodeficiency virus [HIV] disease: Secondary | ICD-10-CM | POA: Diagnosis not present

## 2024-03-19 LAB — HIV-1 RNA QUANT-NO REFLEX-BLD
HIV 1 RNA Quant: 60 {copies}/mL
LOG10 HIV-1 RNA: 1.778 {Log_copies}/mL

## 2024-03-19 NOTE — Progress Notes (Signed)
 PROGRESS NOTE   HPI was taken from NP [name]: 49 y.o with significant PMH of EtOH abuse ,Cocaine use, Homelessness, Uncontrolled HIV, on BIKTARVY, A-fib, GERD, Hypothyroidism, Diabetes, who presented to the ED with chief complaints of unresponsiveness.   Per ED reports, EMS report that patient was boarding at a friend's house who noticed that she had not woken up the whole day. Patient's friend attempted to wake her up but noticed that she was unresponsive and making "gurgling" sound so they called EMS. On EMS arrival, patient was found to be unconscious and incontinent of urine and feces.     ED Course: Initial vital signs showed HR of 118 beats/minute, BP 90/60 mm Hg, the RR 25 breaths/minute, and the oxygen saturation 98% on NRB and a temperature of 100.21F. Patient was obtunded with no gag reflex with debris around the mouth and nose concerning for stomach contents. She was emergently intubated for airway protection. Pertinent Labs/Diagnostics Findings: Na+/ K+:140/2.6  Glucose: 117 BUN/Cr.:33/1.18 AST/ALT:45/17 WBC:9.6 K/L PCT: 2.2 Lactic acid: 1.1 COVID PCR: Negative,  ABG: pO2 139; pCO2 27; pH 7.51;  HCO3 21.5, %O2 Sat 100.  CXR> see full report  Medication administered in the ED: Patient given 30 cc/kg of fluids and started on broad-spectrum antibiotics Ceftriaxone and Azithromycin for suspected sepsis Disposition:ICU  As per Dr. Mayford Knife 4/2-03/18/24: Pt has remained medically stable. Pt still lacks capacity to make medical decisions for herself as per psych. Guardianship is pending still. Unsafe d/c plan.    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 Poor historian and does not have capacity to make decisions as per psych. Still waiting on guardianship. Continue on biktarvy. Continue on atovaquone for PCP ppx, weekly azithromycin  HIV associated neurocognitive disorder: MRI did not show any enhancing lesions but did show severe cerebral atrophy likely secondary to HIV/AIDS.    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 Continue w/ conservative management as per ortho surg. Continues to complain of pain, will reach out to ortho - via secure chat today dr. Dallas Schimke says no additional w/u unless visible signs of infection  Diarrhea: Resolved    Hypotension: continue on midodrine    Social issues: homeless and has polysubstance use disorder (alcohol, cocaine).  Pt's boyfriend/fiance' is not able to visit the pt anymore b/c concern of him bringing the pt alcohol and/or illicit drugs. Awaiting guardianship still. Unsafe d/c plan       DVT prophylaxis:  lovenox Code Status: full  Family Communication:  Disposition Plan: no safe d/c plan. Waiting on guardianship  Level of care: Med-Surg  Status is: Inpatient Remains inpatient appropriate because: medically stable. Still waiting on guardianship and safe d/c plan    Consultants:    Procedures:   Antimicrobials:  Subjective: Pt c/o hip pain, ongoing  Objective: Vitals:   03/18/24 2047 03/19/24 0455 03/19/24 0500 03/19/24 0813  BP: 113/84 111/78  128/85  Pulse: 97 89  90  Resp: 20 20  18   Temp: 98.5 F (36.9 C) 98.3 F (36.8 C)  98 F (36.7 C)  TempSrc: Oral Oral  Oral  SpO2: 99% 99%  100%  Weight:   80.8 kg   Height:        Intake/Output Summary (Last 24 hours) at 03/19/2024 1340 Last data filed at 03/19/2024 1053 Gross per 24 hour  Intake 600 ml  Output --  Net 600 ml    Filed Weights   03/17/24 0329 03/18/24 0034 03/19/24 0500  Weight: 75.5 kg 80.7 kg 80.8 kg    Examination:  General exam:  appears calm & comfortable  Respiratory system: clear breath sounds b/l  Cardiovascular system: S1 & S2+. No rubs or gallops  Gastrointestinal system: abd is soft, NT, ND & hypoactive bowel sounds Central nervous system: alert & awake. Moves all extremities  Psychiatry: judgement and insight appears at baseline. Flat mood and affect    Data Reviewed: I have personally reviewed following labs and imaging studies  CBC: Recent Labs  Lab 03/17/24 0525  WBC 4.7  HGB 9.2*  HCT 28.4*  MCV 84.3  PLT 246   Basic Metabolic Panel: Recent Labs  Lab 03/17/24 0525  NA 139  K 3.7  CL 109  CO2 24  GLUCOSE 92  BUN 12  CREATININE 0.66  CALCIUM 8.8*   GFR: Estimated Creatinine Clearance: 94.9 mL/min (by C-G formula based on SCr of 0.66 mg/dL). Liver Function Tests: No results for input(s): "AST", "ALT", "ALKPHOS", "BILITOT", "PROT", "ALBUMIN" in the last 168 hours.  No results for input(s): "LIPASE", "AMYLASE" in the last 168 hours. No results for input(s): "AMMONIA" in the last 168 hours. Coagulation Profile: No results for input(s): "INR", "PROTIME" in the last 168 hours. Cardiac Enzymes: No results for input(s): "CKTOTAL", "CKMB", "CKMBINDEX", "TROPONINI" in the last 168 hours.  BNP (last 3 results) No results for input(s): "PROBNP" in the last 8760 hours. HbA1C: No results for input(s): "HGBA1C" in the last 72 hours. CBG: No results for input(s): "GLUCAP" in the last 168 hours.  Lipid Profile: No results for input(s): "CHOL", "HDL", "LDLCALC", "TRIG", "CHOLHDL", "LDLDIRECT" in the last 72 hours. Thyroid Function Tests: No results for input(s): "TSH", "T4TOTAL", "FREET4", "T3FREE", "THYROIDAB" in the last 72 hours. Anemia Panel: No results for input(s): "VITAMINB12", "FOLATE", "FERRITIN", "TIBC", "IRON", "RETICCTPCT" in the last 72 hours. Sepsis Labs: No results for input(s): "PROCALCITON", "LATICACIDVEN" in the last 168 hours.  No results found for this or any previous  visit (from the past 240 hours).       Radiology Studies: No results found.      Scheduled Meds:  atovaquone  1,500 mg Oral Q breakfast   azithromycin  1,200 mg Oral Weekly   bictegravir-emtricitabine-tenofovir AF  1 tablet Oral Daily   DULoxetine  30 mg Oral QHS   enoxaparin (LOVENOX) injection  40 mg Subcutaneous QHS   folic acid  1 mg Oral Daily   hydrocerin   Topical BID   lidocaine  1 patch Transdermal Q24H   lipase/protease/amylase  24,000 Units Oral TID AC   midodrine  5 mg Oral TID WC   multivitamin with minerals  1 tablet Oral QHS   pantoprazole  40 mg Oral BID   senna-docusate  2 tablet Oral BID   thiamine  100 mg Oral Daily   Continuous Infusions:   LOS: 102 days       Silvano Bilis, MD [facility]  If 7PM-7AM, please contact night-coverage [email or web address] Password [email or web address] 03/19/2024, 1:40 PM

## 2024-03-20 DIAGNOSIS — F02818 Dementia in other diseases classified elsewhere, unspecified severity, with other behavioral disturbance: Secondary | ICD-10-CM | POA: Diagnosis not present

## 2024-03-20 DIAGNOSIS — B20 Human immunodeficiency virus [HIV] disease: Secondary | ICD-10-CM | POA: Diagnosis not present

## 2024-03-20 LAB — HELPER T-LYMPH-CD4 (ARMC ONLY)
% CD 4 Pos. Lymph.: 18.9 % — ABNORMAL LOW (ref 30.8–58.5)
Absolute CD 4 Helper: 416 /uL (ref 359–1519)
Basophils Absolute: 0.1 10*3/uL (ref 0.0–0.2)
Basos: 1 %
EOS (ABSOLUTE): 0.5 10*3/uL — ABNORMAL HIGH (ref 0.0–0.4)
Eos: 9 %
Hematocrit: 34 % (ref 34.0–46.6)
Hemoglobin: 10.9 g/dL — ABNORMAL LOW (ref 11.1–15.9)
Immature Grans (Abs): 0 10*3/uL (ref 0.0–0.1)
Immature Granulocytes: 0 %
Lymphocytes Absolute: 2.2 10*3/uL (ref 0.7–3.1)
Lymphs: 41 %
MCH: 26.8 pg (ref 26.6–33.0)
MCHC: 32.1 g/dL (ref 31.5–35.7)
MCV: 84 fL (ref 79–97)
Monocytes Absolute: 0.6 10*3/uL (ref 0.1–0.9)
Monocytes: 11 %
Neutrophils Absolute: 2.1 10*3/uL (ref 1.4–7.0)
Neutrophils: 38 %
Platelets: 272 10*3/uL (ref 150–450)
RBC: 4.07 x10E6/uL (ref 3.77–5.28)
RDW: 13.3 % (ref 11.7–15.4)
WBC: 5.4 10*3/uL (ref 3.4–10.8)

## 2024-03-20 NOTE — Progress Notes (Signed)
 PROGRESS NOTE   HPI was taken from NP [name]: 48 y.o with significant PMH of EtOH abuse ,Cocaine use, Homelessness, Uncontrolled HIV, on BIKTARVY, A-fib, GERD, Hypothyroidism, Diabetes, who presented to the ED with chief complaints of unresponsiveness.   Per ED reports, EMS report that patient was boarding at a friend's house who noticed that she had not woken up the whole day. Patient's friend attempted to wake her up but noticed that she was unresponsive and making "gurgling" sound so they called EMS. On EMS arrival, patient was found to be unconscious and incontinent of urine and feces.     ED Course: Initial vital signs showed HR of 118 beats/minute, BP 90/60 mm Hg, the RR 25 breaths/minute, and the oxygen saturation 98% on NRB and a temperature of 100.66F. Patient was obtunded with no gag reflex with debris around the mouth and nose concerning for stomach contents. She was emergently intubated for airway protection. Pertinent Labs/Diagnostics Findings: Na+/ K+:140/2.6  Glucose: 117 BUN/Cr.:33/1.18 AST/ALT:45/17 WBC:9.6 K/L PCT: 2.2 Lactic acid: 1.1 COVID PCR: Negative,  ABG: pO2 139; pCO2 27; pH 7.51;  HCO3 21.5, %O2 Sat 100.  CXR> see full report  Medication administered in the ED: Patient given 30 cc/kg of fluids and started on broad-spectrum antibiotics Ceftriaxone and Azithromycin for suspected sepsis Disposition:ICU  As per Dr. Mayford Knife 4/2-03/18/24: Pt has remained medically stable. Pt still lacks capacity to make medical decisions for herself as per psych. Guardianship is pending still. Unsafe d/c plan.    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 Poor historian and does not have capacity to make decisions as per psych. Still waiting on guardianship. Continue on biktarvy. Continue on atovaquone for PCP ppx, weekly azithromycin  HIV associated neurocognitive disorder: MRI did not show any enhancing lesions but did show severe cerebral atrophy likely secondary to HIV/AIDS.    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 Continue w/ conservative management as per ortho surg. Continues to complain of pain, will reach out to ortho - via secure chat 4/9 dr. Dallas Schimke says no additional w/u unless visible signs of infection, of which there are none currently  Diarrhea: Resolved    Hypotension: continue on midodrine    Social issues: homeless and has polysubstance use disorder (alcohol, cocaine).  Pt's boyfriend/fiance' is not able to visit the pt anymore b/c concern of him bringing the pt alcohol and/or illicit drugs. Awaiting guardianship still. Unsafe d/c plan       DVT prophylaxis:  lovenox Code Status: full  Family Communication: none Disposition Plan: no safe d/c plan. Waiting on guardianship  Level of care: Med-Surg  Status is: Inpatient Remains inpatient appropriate because: medically stable. Still waiting on guardianship and safe d/c plan    Consultants:  ID, orthopedics  Procedures:   Antimicrobials:  Subjective: Pt c/o hip pain, ongoing, stable. Tolerating diet.  Objective: Vitals:   03/19/24 2003 03/20/24 0011 03/20/24 0500 03/20/24 0826  BP: 102/72   124/87  Pulse: 95   93  Resp: 18   18  Temp: (!) 97.5 F (36.4 C)   98.1 F (  36.7 C)  TempSrc:      SpO2: 99%   99%  Weight:  81.3 kg 81.3 kg   Height:       No intake or output data in the 24 hours ending 03/20/24 1308   Filed Weights   03/19/24 0500 03/20/24 0011 03/20/24 0500  Weight: 80.8 kg 81.3 kg 81.3 kg    Examination:  General exam: appears calm & comfortable  Respiratory  system: clear breath sounds b/l  Cardiovascular system: S1 & S2+. No rubs or gallops  Gastrointestinal system: abd is soft, NT, ND & hypoactive bowel sounds Central nervous system: alert & awake. Moves all extremities  Psychiatry: judgement and insight appears at baseline. Flat mood and affect    Data Reviewed: I have personally reviewed following labs and imaging studies  CBC: Recent Labs  Lab 03/17/24 0525 03/18/24 0503  WBC 4.7 5.4  NEUTROABS  --  2.1  HGB 9.2* 10.9*  HCT 28.4* 34.0  MCV 84.3 84  PLT 246 272   Basic Metabolic Panel: Recent Labs  Lab 03/17/24 0525  NA 139  K 3.7  CL 109  CO2 24  GLUCOSE 92  BUN 12  CREATININE 0.66  CALCIUM 8.8*   GFR: Estimated Creatinine Clearance: 95.2 mL/min (by C-G formula based on SCr of 0.66 mg/dL). Liver Function Tests: No results for input(s): "AST", "ALT", "ALKPHOS", "BILITOT", "PROT", "ALBUMIN" in the last 168 hours.  No results for input(s): "LIPASE", "AMYLASE" in the last 168 hours. No results for input(s): "AMMONIA" in the last 168 hours. Coagulation Profile: No results for input(s): "INR", "PROTIME" in the last 168 hours. Cardiac Enzymes: No results for input(s): "CKTOTAL", "CKMB", "CKMBINDEX", "TROPONINI" in the last 168 hours.  BNP (last 3 results) No results for input(s): "PROBNP" in the last 8760 hours. HbA1C: No results for input(s): "HGBA1C" in the last 72 hours. CBG: No results for input(s): "GLUCAP" in the last 168 hours.  Lipid Profile: No results for input(s): "CHOL", "HDL", "LDLCALC", "TRIG", "CHOLHDL", "LDLDIRECT" in the last 72 hours. Thyroid Function Tests: No results for input(s): "TSH", "T4TOTAL", "FREET4", "T3FREE", "THYROIDAB" in the last 72 hours. Anemia Panel: No results for input(s): "VITAMINB12", "FOLATE", "FERRITIN", "TIBC", "IRON", "RETICCTPCT" in the last 72 hours. Sepsis Labs: No results for input(s): "PROCALCITON", "LATICACIDVEN" in the last 168 hours.  No results found for this  or any previous visit (from the past 240 hours).       Radiology Studies: No results found.      Scheduled Meds:  atovaquone  1,500 mg Oral Q breakfast   azithromycin  1,200 mg Oral Weekly   bictegravir-emtricitabine-tenofovir AF  1 tablet Oral Daily   DULoxetine  30 mg Oral QHS   enoxaparin (LOVENOX) injection  40 mg Subcutaneous QHS   folic acid  1 mg Oral Daily   hydrocerin   Topical BID   lidocaine  1 patch Transdermal Q24H   lipase/protease/amylase  24,000 Units Oral TID AC   midodrine  5 mg Oral TID WC   multivitamin with minerals  1 tablet Oral QHS   pantoprazole  40 mg Oral BID   senna-docusate  2 tablet Oral BID   thiamine  100 mg Oral Daily   Continuous Infusions:   LOS: 103 days       Silvano Bilis, MD [facility]  If 7PM-7AM, please contact night-coverage [email or web address] Password [email or web address] 03/20/2024, 1:08 PM

## 2024-03-21 DIAGNOSIS — B589 Toxoplasmosis, unspecified: Secondary | ICD-10-CM | POA: Diagnosis not present

## 2024-03-21 DIAGNOSIS — M609 Myositis, unspecified: Secondary | ICD-10-CM

## 2024-03-21 DIAGNOSIS — B20 Human immunodeficiency virus [HIV] disease: Secondary | ICD-10-CM | POA: Diagnosis not present

## 2024-03-21 DIAGNOSIS — F02818 Dementia in other diseases classified elsewhere, unspecified severity, with other behavioral disturbance: Secondary | ICD-10-CM | POA: Diagnosis not present

## 2024-03-21 DIAGNOSIS — A480 Gas gangrene: Secondary | ICD-10-CM | POA: Diagnosis not present

## 2024-03-21 NOTE — Plan of Care (Signed)

## 2024-03-21 NOTE — Progress Notes (Addendum)
 PROGRESS NOTE   HPI was taken from NP [name]: 48 y.o with significant PMH of EtOH abuse ,Cocaine use, Homelessness, Uncontrolled HIV, on BIKTARVY, A-fib, GERD, Hypothyroidism, Diabetes, who presented to the ED with chief complaints of unresponsiveness.   Per ED reports, EMS report that patient was boarding at a friend's house who noticed that she had not woken up the whole day. Patient's friend attempted to wake her up but noticed that she was unresponsive and making "gurgling" sound so they called EMS. On EMS arrival, patient was found to be unconscious and incontinent of urine and feces.     ED Course: Initial vital signs showed HR of 118 beats/minute, BP 90/60 mm Hg, the RR 25 breaths/minute, and the oxygen saturation 98% on NRB and a temperature of 100.26F. Patient was obtunded with no gag reflex with debris around the mouth and nose concerning for stomach contents. She was emergently intubated for airway protection. Pertinent Labs/Diagnostics Findings: Na+/ K+:140/2.6  Glucose: 117 BUN/Cr.:33/1.18 AST/ALT:45/17 WBC:9.6 K/L PCT: 2.2 Lactic acid: 1.1 COVID PCR: Negative,  ABG: pO2 139; pCO2 27; pH 7.51;  HCO3 21.5, %O2 Sat 100.  CXR> see full report  Medication administered in the ED: Patient given 30 cc/kg of fluids and started on broad-spectrum antibiotics Ceftriaxone and Azithromycin for suspected sepsis Disposition:ICU  As per Dr. Mayford Knife 4/2-03/18/24: Pt has remained medically stable. Pt still lacks capacity to make medical decisions for herself as per psych. Guardianship is pending still. Unsafe d/c plan.    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 Poor historian and does not have capacity to make decisions as per psych. Still waiting on guardianship. Continue on biktarvy. Continue on atovaquone for PCP ppx, weekly azithromycin  HIV associated neurocognitive disorder: MRI did not show any enhancing lesions but did show severe cerebral atrophy likely secondary to HIV/AIDS.    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 Continue w/ conservative management as per ortho surg. Continues to complain of pain, will reach out to ortho - via secure chat 4/9 dr. Dallas Schimke says no additional w/u unless visible signs of infection, of which there are none currently  Diarrhea: Resolved    Hypotension: BPs stable and wnl, will hold midodrine and monitor   Social issues: homeless and has polysubstance use disorder (alcohol, cocaine).  Pt's boyfriend/fiance' is not able to visit the pt anymore b/c concern of him bringing the pt alcohol and/or illicit drugs. Awaiting guardianship still. Unsafe d/c plan       DVT prophylaxis:  lovenox Code Status: full  Family Communication: none Disposition Plan: no safe d/c plan. Waiting on guardianship  Level of care: Med-Surg  Status is: Inpatient Remains inpatient appropriate because: medically stable. Still waiting on guardianship and safe d/c plan    Consultants:  ID, orthopedics  Procedures:   Antimicrobials:  Subjective: Pt c/o hip pain, ongoing, stable. Tolerating diet. BM earlier today.  Objective: Vitals:   03/20/24 2002 03/21/24 0353 03/21/24 0449 03/21/24 0759  BP: 96/76 124/87  119/84  Pulse: 100 94  84  Resp: 16 16  16   Temp: 98.1  F (36.7 C) 97.9 F (36.6 C)  98.4 F (36.9 C)  TempSrc: Oral Oral    SpO2: 99% 100%  100%  Weight:   82.3 kg   Height:        Intake/Output Summary (Last 24 hours) at 03/21/2024 1251 Last data filed at 03/21/2024 1141 Gross per 24 hour  Intake 480 ml  Output --  Net 480 ml      Filed Weights   03/20/24 0011 03/20/24 0500 03/21/24 0449  Weight: 81.3 kg 81.3 kg 82.3 kg    Examination:  General exam: appears calm & comfortable  Respiratory system: clear breath sounds b/l  Cardiovascular system: S1 & S2+. No rubs or gallops  Gastrointestinal system: abd is soft, NT, ND & hypoactive bowel sounds Central nervous system: alert & awake. Moves all extremities  Psychiatry: judgement and insight appears at baseline. Flat mood and affect    Data Reviewed: I have personally reviewed following labs and imaging studies  CBC: Recent Labs  Lab 03/17/24 0525 03/18/24 0503  WBC 4.7 5.4  NEUTROABS  --  2.1  HGB 9.2* 10.9*  HCT 28.4* 34.0  MCV 84.3 84  PLT 246 272   Basic Metabolic Panel: Recent Labs  Lab 03/17/24 0525  NA 139  K 3.7  CL 109  CO2 24  GLUCOSE 92  BUN 12  CREATININE 0.66  CALCIUM 8.8*   GFR: Estimated Creatinine Clearance: 95.7 mL/min (by C-G formula based on SCr of 0.66 mg/dL). Liver Function Tests: No results for input(s): "AST", "ALT", "ALKPHOS", "BILITOT", "PROT", "ALBUMIN" in the last 168 hours.  No results for input(s): "LIPASE", "AMYLASE" in the last 168 hours. No results for input(s): "AMMONIA" in the last 168 hours. Coagulation Profile: No results for input(s): "INR", "PROTIME" in the last 168 hours. Cardiac Enzymes: No results for input(s): "CKTOTAL", "CKMB", "CKMBINDEX", "TROPONINI" in the last 168 hours.  BNP (last 3 results) No results for input(s): "PROBNP" in the last 8760 hours. HbA1C: No results for input(s): "HGBA1C" in the last 72 hours. CBG: No results for input(s): "GLUCAP" in the last 168 hours.  Lipid Profile: No results for input(s): "CHOL", "HDL", "LDLCALC", "TRIG", "CHOLHDL", "LDLDIRECT" in the last 72 hours. Thyroid Function Tests: No results for input(s): "TSH", "T4TOTAL", "FREET4", "T3FREE", "THYROIDAB" in the last 72 hours. Anemia Panel: No results for input(s): "VITAMINB12", "FOLATE",  "FERRITIN", "TIBC", "IRON", "RETICCTPCT" in the last 72 hours. Sepsis Labs: No results for input(s): "PROCALCITON", "LATICACIDVEN" in the last 168 hours.  No results found for this or any previous visit (from the past 240 hours).       Radiology Studies: No results found.      Scheduled Meds:  atovaquone  1,500 mg Oral Q breakfast   azithromycin  1,200 mg Oral Weekly   bictegravir-emtricitabine-tenofovir AF  1 tablet Oral Daily   DULoxetine  30 mg Oral QHS   enoxaparin (LOVENOX) injection  40 mg Subcutaneous QHS   folic acid  1 mg Oral Daily   hydrocerin   Topical BID   lidocaine  1 patch Transdermal Q24H   lipase/protease/amylase  24,000 Units Oral TID AC   multivitamin with minerals  1 tablet Oral QHS   pantoprazole  40 mg Oral BID   senna-docusate  2 tablet Oral BID   thiamine  100 mg Oral Daily   Continuous Infusions:   LOS: 104 days       Meghan Bilis, MD [facility]  If 7PM-7AM, please contact night-coverage [email or web address] Password [facility] 03/21/2024,  12:51 PM

## 2024-03-21 NOTE — Progress Notes (Signed)
 Date of Admission:  12/08/2023     ID: Meghan Jarvis is a 48 y.o. 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 Meghan Jarvis is a 48 y.o. with a history of AIDS, not compliant with meds or visits ,  , cocaine use was brought in by EMS after being found unresponsive on the couch  in an aquaintance place-   Subjective: Walking in the corridor with walker Says she has low back pain  Medications:   atovaquone  1,500 mg Oral Q breakfast   azithromycin  1,200 mg Oral Weekly   bictegravir-emtricitabine-tenofovir AF  1 tablet Oral Daily   DULoxetine  30 mg Oral QHS   enoxaparin (LOVENOX) injection  40 mg Subcutaneous QHS   folic acid  1 mg Oral Daily   hydrocerin   Topical BID   lidocaine  1 patch Transdermal Q24H   lipase/protease/amylase  24,000 Units Oral TID AC   multivitamin with minerals  1 tablet Oral QHS   pantoprazole  40 mg Oral BID   senna-docusate  2 tablet Oral BID   thiamine  100 mg Oral Daily    Objective: Vital signs in last 24 hours: Patient Vitals for the past 24 hrs:  BP Temp Temp src Pulse Resp SpO2 Weight  03/21/24 1526 129/80 98.4 F (36.9 C) -- (!) 102 16 99 % --  03/21/24 0759 119/84 98.4 F (36.9 C) -- 84 16 100 % --  03/21/24 0449 -- -- -- -- -- -- 82.3 kg  03/21/24 0353 124/87 97.9 F (36.6 C) Oral 94 16 100 % --  03/20/24 2002 96/76 98.1 F (36.7 C) Oral 100 16 99 % --  ambulating Hss1s2 Lungs clear to auscultation CNS grossly non focal   Lab Results    Latest Ref Rng & Units 03/18/2024    5:03 AM 03/17/2024    5:25 AM 03/12/2024    4:59 AM  CBC  WBC 3.4 - 10.8 x10E3/uL 5.4  4.7  4.5   Hemoglobin 11.1 - 15.9 g/dL 16.1  9.2  8.9   Hematocrit 34.0 - 46.6 % 34.0  28.4  27.4   Platelets 150 - 450 x10E3/uL 272   246  252        Latest Ref Rng & Units 03/17/2024    5:25 AM 03/12/2024    4:59 AM 03/05/2024   12:46 PM  CMP  Glucose 70 - 99 mg/dL 92   94   BUN 6 - 20 mg/dL 12   13   Creatinine 0.96 - 1.00 mg/dL 0.45  4.09  8.11   Sodium 135 - 145 mmol/L 139   139   Potassium 3.5 - 5.1 mmol/L 3.7   4.1   Chloride 98 - 111 mmol/L 109   109   CO2 22 - 32 mmol/L 24   24   Calcium 8.9 - 10.3 mg/dL 8.8   9.1   Total Protein 6.5 - 8.1 g/dL   7.5   Total Bilirubin 0.0 - 1.2 mg/dL   0.4   Alkaline Phos 38 - 126 U/L   59   AST 15 - 41 U/L   33   ALT 0 - 44 U/L   33       Microbiology:  12/28 BC NG  Toxo IgG > 400 Toxo PCR neg Toxo igM neg RPR NR CMV DNA neg Crypto neg HIV RNA 2 million>> 34, 000 Cd4 is 14 ( 2.4%) repeat on 1/29 is 416 ( 23%) Beta D glucan > 500 (  repeated) Histoplasma neg Fungal antibodies negative Genosure prime- no resistant mutations QuantiFERON gold indeterminate.  No treatment needed now.  Can repeat it later.  Radiology MRI hip rt done on 02/25/24 Reviewed personally     Intramuscular edema and enhancement within the right gluteus medius and minimus muscles with a central area of non-enhancement in the posterior right gluteus minimus muscle adjacent to the posterior acetabular rim. Similar findings are present at the lateral aspect of the right gluteus maximus muscle adjacent to the greater trochanter. 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 No fluid signal at these locations to suggest abscess formation.   Assessment/Plan: Lumbar pain-  Persistent Rt thigh, rt hip pain MRI shows myositis , myonecrosis of the gluteus medius , minimus May repeat MRI  What is the cause Could it be related to Toxo ( has IgG >400 She was assesed by ortho and no concern   Encephalopathy  has resolved ( Likely related to cocaine use on presentation)  Aspiration leading to acute hypoxic respiratory failure with right middle lobe and lower  lobe consolidation.  Status post intubation and was mechanically ventilated in the early part of the admission.  Now has been extubated for many days Took treatment with Zosyn completed treatment repeat chest x-ray is clear      Rt sided  flank, chest pain-intermittent-- has resolved  CT abd ok- MRI lumbar spine deg changes- no acute findings other than degeneration    Rash- resolved likely was due to bactrim- changed to Atovaquone- Mepron   H/o fall with recurrent ED presentation-      Pruritus with excoriations and hyperpigmentiaon resolved ? Bactrim related rash on basleine dry and excoriated skin    AIDS- non compliant with meds or visits to her Provider- not been in care since 2021- used to be followed at [facility] before Was on Biktarvy at one time and was non compliant then VL now is 2 million and cd4 is 11  Started Biktarvy on 12/14/23 . not sure how much compliance to be expected as her social situation is not conducive and so is her substance use- watch closely for Immune reconstitution inflammatory syndrome Genotype no resistance to NRTI, NNRTI, Integrase inhibitor and PI Repeat Vl shows a significant drop in 3 weeks from 2 million to 34,000 . Latest vl is 60 and CD4 is 416 ( 18.8%) from 03/18/24 Will Dc azithromycin as no need for MAI prophylaxis  No evidence of IRIS   Toxo IgG very high-  MRI brain no CNS lesions  . TOXO PCR negative On  PCP/TOXO  and MAI prophylaxis. Was On bactrim for PCP and Toxo prophylaxis- because of new rash concerning for bactrim allergy was changed to atovaquone Rash resolved  Beta D glucan high > 500 (fungal antibodies negative-  Normalized on repeat testing     Active cocaine use  ETOH abuse  Failure to thrive- combination of drug use and AIDS Anemia   leucopenia/thrombocytopenia could be due to AIDS ETOH abuse   resolved  CMV DNA neg , AFB blood culture sent-      HIV associated neurocognitive disorder compounded by substance use  with no insight in her medical condition  She has no capacity , and waiting for placement- DSS involved     Discussed the management with patient .

## 2024-03-21 NOTE — TOC Progression Note (Signed)
 Transition of Care [facility]) - Progression Note    Patient Details  Name: Meghan Jarvis MRN: [id number] Date of Birth: [birth year]  [facility] CM/SW Contact  Margarito Liner, LCSW Phone Number: 03/21/2024, 11:24 AM  Clinical Narrative:   TOC continues to follow progress.  Expected Discharge Plan and Services         Expected Discharge Date: 01/10/24                                     Social Determinants of Health (SDOH) Interventions SDOH Screenings   Food Insecurity: Patient Unable To Answer (12/09/2023)  Recent Concern: Food Insecurity - Food Insecurity Present (09/26/2023)  Housing: Patient Unable To Answer (12/09/2023)  Recent Concern: Housing - Medium Risk (09/26/2023)  Transportation Needs: Patient Unable To Answer (12/09/2023)  Recent Concern: Transportation Needs - Unmet Transportation Needs (10/11/2023)  Utilities: Patient Unable To Answer (12/09/2023)  Recent Concern: Utilities - At Risk (09/25/2023)  Tobacco Use: High Risk (12/08/2023)    Readmission Risk Interventions     No data to display

## 2024-03-21 NOTE — Plan of Care (Signed)
   Problem: Coping: Goal: Ability to adjust to condition or change in health will improve Outcome: Progressing

## 2024-03-22 DIAGNOSIS — F1410 Cocaine abuse, uncomplicated: Secondary | ICD-10-CM | POA: Diagnosis not present

## 2024-03-22 DIAGNOSIS — F02818 Dementia in other diseases classified elsewhere, unspecified severity, with other behavioral disturbance: Secondary | ICD-10-CM | POA: Diagnosis not present

## 2024-03-22 DIAGNOSIS — Z008 Encounter for other general examination: Secondary | ICD-10-CM

## 2024-03-22 DIAGNOSIS — B20 Human immunodeficiency virus [HIV] disease: Secondary | ICD-10-CM | POA: Diagnosis not present

## 2024-03-22 NOTE — Progress Notes (Addendum)
 PROGRESS NOTE    Meghan Jarvis  YQI:[id number] DOB: Jul 01, 1976 DOA: 12/08/2023 [name]  Subjective: Pt seen and examined. Has lunch at her bedside. She ordered breakfast food including pancakes and grits with cheese. No complaints.   Hospital Course: HPI/Hospital Course: 48yo with h/o polysubstance (ETOH, cocaine) abuse, afib, homelessness, AIDS, hypothyroidism, and DM who presented on 12/08/23 with acute respiratory failure.  She was found down by a friend and could not wake her up.  She was found to be hypoxic in the 70s. Admitted to ICU, intubated, started on antibiotics for pneumonia.  12/31: extubated. ID consulted.  MRI brain with advanced cerebral atrophy.  Psych evaluated - "Patient is lacking and reasoning in her medical care for example she is unable to describe the consequence that she will have if she does not take her medications or has complications with her chronic conditions" and needs assistance with housing and finances due to cognitive decline.  APS/DSS pending guardianship and will need placement.  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 On Mepron for PCP prophylaxis.  Encounter for assessment of decision-making capacity 03-22-2024 repeated psychiatric evaluations on 03-17-2024 and  01-28-2024 that patient continues to display lacking capacity to make medical decisions. TOC still attempting to pursue legal guardianship with the court system.  AIDS (acquired immune deficiency syndrome) (HCC) 03-22-2024 pt remains on Biktarvy. On Mepron for PCP prophylaxis.  Lab Results  Component Value Date/Time   HIV1RNAQUANT 60 03/18/2024 05:03 AM   Last CD4 count of 03-18-2024 was 416  Cocaine abuse (HCC) 03-22-2024 pt's boyfriend/fiance has been banned from hospital due to allegedly bringing in illicit  substance/alcohol to patient on evening of 03-05-2024.   RESOLVED Hospital Problems:  AKI (acute kidney injury) (HCC)-resolved as of 03/22/2024 Creatinine went up to 1.38 on 1/13.  Creatinine down to 0.75 on 1/20.  Right knee pain-resolved as of 03/22/2024 Patient asked for Ace bandage.  Right knee x-ray negative.  Uric acid 4.4.  Continue to monitor.  Abdominal pain-resolved as of 03/22/2024 Patient intermittently complains of abdominal pain.  Prior imaging is negative.  Patient having bowel movements.  Taper pain medication.  Trial of Flexeril.  Hypotension-resolved as of 03/22/2024 Resolved. No longer on midodrine to 2.5 mg 3 times daily.  Underweight (BMI < 18.5)-resolved as of 03/22/2024 Last BMI 18.54  Headache-resolved as of 03/22/2024 Tylenol as needed.  IV magnesium earlier in hospital course.  Cryptococcus antigen negative.  Diarrhea-resolved as of 03/22/2024 Likely overflow constipation causing her diarrhea.  Patient did not like lactulose or MiraLAX.  Continue Colace.  Generalized weakness-resolved as of 03/22/2024 Physical therapy recommending rehab  Pancytopenia (HCC)-resolved as of 03/22/2024 Secondary to HIV.  Platelet platelet count up in the normal range at 285, white blood cell count up in the normal range at 6.6, hemoglobin 8.1.  Chest pain-resolved as of 03/22/2024 Atypical in nature.  Troponin x 2 negative.  EKG did not show any ST elevations.  Repeat EKG on 1/17 did not show any ST elevations.  Severe sepsis (HCC)-resolved as of 03/22/2024 Present on admission with tachycardia, tachypnea, fever, pneumonia, acute respiratory failure and acute metabolic encephalopathy.  Patient completed antibiotics.  Uncontrolled type 2 diabetes mellitus with hypoglycemia, without long-term current use of insulin (HCC)-resolved as of 03/22/2024 Hemoglobin A1c 6.0.   Acute metabolic encephalopathy-resolved as of 03/22/2024 Mental status improved from admission but patient is not  the  best historian and does not capacity to make decisions at this time.  DSS referral by TOC.  MRI did not show any enhancing lesions but did show severe cerebral atrophy.  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  Protein-calorie malnutrition, severe-resolved as of 03/22/2024 Continue supplements  Pneumonia of right lower lobe due to infectious organism-resolved as of 03/22/2024 Completed 5 days of antibiotic.  Acute hypoxic respiratory failure (HCC)-resolved as of 03/22/2024 Resolved.  Patient extubated on 12/30.  Patient currently breathing on room air.  Alcohol abuse-resolved as of 03/22/2024 Continue thiamine multivitamin and folic acid   DVT prophylaxis: enoxaparin (LOVENOX) injection 40 mg Start: 12/11/23 2200    Code Status: Full Code Family Communication: no family at bedside Disposition Plan: unknown. Reason for continuing need for hospitalization: medically stable for DC. Awaiting guardianship proceedings.  Objective: Vitals:   03/21/24 1526 03/21/24 2031 03/22/24 0405 03/22/24 0751  BP: 129/80 109/74 111/80 108/69  Pulse: (!) 102 (!) 107 96 100  Resp: 16 18 18 16   Temp: 98.4 F (36.9 C) 98.2 F (36.8 C) 98.3 F (36.8 C) 98.6 F (37 C)  TempSrc:    Oral  SpO2: 99% 98% 99% 100%  Weight:      Height:        Intake/Output Summary (Last 24 hours) at 03/22/2024 1317 Last data filed at 03/22/2024 0900 Gross per 24 hour  Intake 840 ml  Output --  Net 840 ml   Filed Weights   03/20/24 0011 03/20/24 0500 03/21/24 0449  Weight: 81.3 kg 81.3 kg 82.3 kg    Examination:  Physical Exam Vitals and nursing note reviewed.  Constitutional:      General: She is not in acute distress.    Appearance: She is not toxic-appearing or diaphoretic.  HENT:     Head: Normocephalic and atraumatic.     Nose: Nose normal.  Eyes:     General: No scleral icterus. Cardiovascular:     Rate and Rhythm: Normal rate and regular rhythm.  Pulmonary:     Effort: Pulmonary effort is  normal.     Breath sounds: Normal breath sounds.  Abdominal:     General: Bowel sounds are normal.     Palpations: Abdomen is soft.  Skin:    General: Skin is warm and dry.     Capillary Refill: Capillary refill takes less than 2 seconds.  Neurological:     Mental Status: She is alert.     Comments: Not oriented to situation. Knows she is in the hospital but not why. Able to state her name and SOB.     Data Reviewed: I have personally reviewed following labs and imaging studies  CBC: Recent Labs  Lab 03/17/24 0525 03/18/24 0503  WBC 4.7 5.4  NEUTROABS  --  2.1  HGB 9.2* 10.9*  HCT 28.4* 34.0  MCV 84.3 84  PLT 246 272   Basic Metabolic Panel: Recent Labs  Lab 03/17/24 0525  NA 139  K 3.7  CL 109  CO2 24  GLUCOSE 92  BUN 12  CREATININE 0.66  CALCIUM 8.8*   GFR: Estimated Creatinine Clearance: 95.7 mL/min (by C-G formula based on SCr of 0.66 mg/dL).  BNP (last 3 results) Recent Labs    12/08/23 2213  BNP 62.7   Scheduled Meds:  atovaquone  1,500 mg Oral Q breakfast   bictegravir-emtricitabine-tenofovir AF  1 tablet Oral Daily   DULoxetine  30 mg Oral QHS   enoxaparin (LOVENOX) injection  40 mg Subcutaneous  QHS   folic acid  1 mg Oral Daily   hydrocerin   Topical BID   lidocaine  1 patch Transdermal Q24H   lipase/protease/amylase  24,000 Units Oral TID AC   multivitamin with minerals  1 tablet Oral QHS   pantoprazole  40 mg Oral BID   senna-docusate  2 tablet Oral BID   thiamine  100 mg Oral Daily   Continuous Infusions:   LOS: 105 days   Time spent: 45 minutes  Unk Garb, DO  [facility]  03/22/2024, 1:17 PM

## 2024-03-22 NOTE — Assessment & Plan Note (Signed)
 03-22-2024 pt remains on Biktarvy. On Mepron for PCP prophylaxis.  Lab Results  Component Value Date/Time   HIV1RNAQUANT 60 03/18/2024 05:03 AM   Last CD4 count of 03-18-2024 was 416

## 2024-03-22 NOTE — Assessment & Plan Note (Signed)
 03-22-2024 repeated psychiatric evaluations on 03-17-2024 and  01-28-2024 that patient continues to display lacking capacity to make medical decisions. TOC still attempting to pursue legal guardianship with the court system.

## 2024-03-22 NOTE — Subjective & Objective (Signed)
 Pt seen and examined. Has lunch at her bedside. She ordered breakfast food including pancakes and grits with cheese. No complaints.

## 2024-03-23 DIAGNOSIS — E43 Unspecified severe protein-calorie malnutrition: Secondary | ICD-10-CM | POA: Insufficient documentation

## 2024-03-23 DIAGNOSIS — F02818 Dementia in other diseases classified elsewhere, unspecified severity, with other behavioral disturbance: Secondary | ICD-10-CM | POA: Diagnosis not present

## 2024-03-23 DIAGNOSIS — B20 Human immunodeficiency virus [HIV] disease: Secondary | ICD-10-CM | POA: Diagnosis not present

## 2024-03-23 NOTE — Plan of Care (Signed)
  Problem: Coping: Goal: Ability to adjust to condition or change in health will improve Outcome: Progressing   Problem: Health Behavior/Discharge Planning: Goal: Ability to identify and utilize available resources and services will improve Outcome: Progressing   

## 2024-03-23 NOTE — Progress Notes (Signed)
 PROGRESS NOTE   HPI was taken from NP Meghan Jarvis: 48 y.o with significant PMH of EtOH abuse ,Cocaine use, Homelessness, Uncontrolled HIV, on BIKTARVY, A-fib, GERD, Hypothyroidism, Diabetes, who presented to the ED with chief complaints of unresponsiveness.   Per ED reports, EMS report that patient was boarding at a friend's house who noticed that she had not woken up the whole day. Patient's friend attempted to wake her up but noticed that she was unresponsive and making "gurgling" sound so they called EMS. On EMS arrival, patient was found to be unconscious and incontinent of urine and feces.     ED Course: Initial vital signs showed HR of 118 beats/minute, BP 90/60 mm Hg, the RR 25 breaths/minute, and the oxygen saturation 98% on NRB and a temperature of 100.23F. Patient was obtunded with no gag reflex with debris around the mouth and nose concerning for stomach contents. She was emergently intubated for airway protection. Pertinent Labs/Diagnostics Findings: Na+/ K+:140/2.6  Glucose: 117 BUN/Cr.:33/1.18 AST/ALT:45/17 WBC:9.6 K/L PCT: 2.2 Lactic acid: 1.1 COVID PCR: Negative,  ABG: pO2 139; pCO2 27; pH 7.51;  HCO3 21.5, %O2 Sat 100.  CXR> see full report  Medication administered in the ED: Patient given 30 cc/kg of fluids and started on broad-spectrum antibiotics Ceftriaxone and Azithromycin for suspected sepsis Disposition:ICU  As per Dr. Broadus Canes 4/2-03/18/24: Pt has remained medically stable. Pt still lacks capacity to make medical decisions for herself as per psych. Guardianship is pending still. Unsafe d/c plan.    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 Poor historian and does not have capacity to make decisions as per psych. Still waiting on guardianship. Continue on biktarvy. Continue on atovaquone for PCP ppx  HIV associated neurocognitive disorder: MRI did not show any enhancing lesions but did show severe cerebral atrophy likely secondary to HIV/AIDS.    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 Continue w/ conservative management as per ortho surg. Continues to complain of pain, will reach out to ortho - via secure chat 4/9 dr. Ernesta Heading says no additional w/u unless visible signs of infection, of which there are none currently  Diarrhea: Resolved    Hypotension: BPs stable and wnl, will hold midodrine and monitor   Social issues: homeless and has polysubstance use disorder (alcohol, cocaine).  Pt's boyfriend/fiance' is not able to visit the pt anymore b/c concern of him bringing the pt alcohol and/or illicit drugs. Awaiting guardianship still. Unsafe d/c plan       DVT prophylaxis:  lovenox Code Status: full  Family Communication: none Disposition Plan: no safe d/c plan. Waiting on guardianship  Level of care: Med-Surg  Status is: Inpatient Remains inpatient appropriate because: medically stable. Still waiting on guardianship and safe d/c plan    Consultants:  ID, orthopedics  Procedures:   Antimicrobials:  Subjective: Pt c/o hip pain, ongoing, stable. Tolerating diet.   Objective: Vitals:   03/22/24 1958 03/23/24 0330 03/23/24 0500 03/23/24 0845  BP: 104/75 118/79  122/84  Pulse: 93 94  98  Resp: 16 16  17   Temp: 98.2 F (  36.8 C) 98 F (36.7 C)  98.7 F (37.1 C)  TempSrc:      SpO2: 100% 100%  100%  Weight:   84.5 kg   Height:       No intake or output data in the 24 hours ending 03/23/24 1408    Filed Weights   03/20/24 0500 03/21/24 0449 03/23/24 0500  Weight: 81.3 kg  82.3 kg 84.5 kg    Examination:  General exam: appears calm & comfortable  Respiratory system: clear breath sounds b/l  Cardiovascular system: S1 & S2+. No rubs or gallops  Gastrointestinal system: abd is soft, NT, ND & hypoactive bowel sounds Central nervous system: alert & awake. Moves all extremities  Psychiatry: judgement and insight appears at baseline. Flat mood and affect    Data Reviewed: I have personally reviewed following labs and imaging studies  CBC: Recent Labs  Lab 03/17/24 0525 03/18/24 0503  WBC 4.7 5.4  NEUTROABS  --  2.1  HGB 9.2* 10.9*  HCT 28.4* 34.0  MCV 84.3 84  PLT 246 272   Basic Metabolic Panel: Recent Labs  Lab 03/17/24 0525  NA 139  K 3.7  CL 109  CO2 24  GLUCOSE 92  BUN 12  CREATININE 0.66  CALCIUM 8.8*   GFR: Estimated Creatinine Clearance: 96.8 mL/min (by C-G formula based on SCr of 0.66 mg/dL). Liver Function Tests: No results for input(s): "AST", "ALT", "ALKPHOS", "BILITOT", "PROT", "ALBUMIN" in the last 168 hours.  No results for input(s): "LIPASE", "AMYLASE" in the last 168 hours. No results for input(s): "AMMONIA" in the last 168 hours. Coagulation Profile: No results for input(s): "INR", "PROTIME" in the last 168 hours. Cardiac Enzymes: No results for input(s): "CKTOTAL", "CKMB", "CKMBINDEX", "TROPONINI" in the last 168 hours.  BNP (last 3 results) No results for input(s): "PROBNP" in the last 8760 hours. HbA1C: No results for input(s): "HGBA1C" in the last 72 hours. CBG: No results for input(s): "GLUCAP" in the last 168 hours.  Lipid Profile: No results for input(s): "CHOL", "HDL", "LDLCALC", "TRIG", "CHOLHDL", "LDLDIRECT" in the last 72 hours. Thyroid Function Tests: No results for input(s): "TSH", "T4TOTAL", "FREET4", "T3FREE", "THYROIDAB" in the last 72 hours. Anemia Panel: No results for input(s): "VITAMINB12", "FOLATE", "FERRITIN", "TIBC", "IRON", "RETICCTPCT" in the last 72 hours. Sepsis Labs: No results  for input(s): "PROCALCITON", "LATICACIDVEN" in the last 168 hours.  No results found for this or any previous visit (from the past 240 hours).       Radiology Studies: No results found.      Scheduled Meds:  atovaquone  1,500 mg Oral Q breakfast   bictegravir-emtricitabine-tenofovir AF  1 tablet Oral Daily   DULoxetine  30 mg Oral QHS   enoxaparin (LOVENOX) injection  40 mg Subcutaneous QHS   folic acid  1 mg Oral Daily   hydrocerin   Topical BID   lidocaine  1 patch Transdermal Q24H   lipase/protease/amylase  24,000 Units Oral TID AC   multivitamin with minerals  1 tablet Oral QHS   pantoprazole  40 mg Oral BID   senna-docusate  2 tablet Oral BID   thiamine  100 mg Oral Daily   Continuous Infusions:   LOS: 106 days       Meghan Calico, MD [facility]  If 7PM-7AM, please contact night-coverage [email or web address] Password [facility] 03/23/2024, 2:08 PM

## 2024-03-24 DIAGNOSIS — F02818 Dementia in other diseases classified elsewhere, unspecified severity, with other behavioral disturbance: Secondary | ICD-10-CM | POA: Diagnosis not present

## 2024-03-24 DIAGNOSIS — B20 Human immunodeficiency virus [HIV] disease: Secondary | ICD-10-CM | POA: Diagnosis not present

## 2024-03-24 NOTE — Plan of Care (Signed)
  Problem: Coping: Goal: Ability to adjust to condition or change in health will improve Outcome: Progressing   Problem: Fluid Volume: Goal: Ability to maintain a balanced intake and output will improve Outcome: Progressing   Problem: Health Behavior/Discharge Planning: Goal: Ability to identify and utilize available resources and services will improve Outcome: Progressing Goal: Ability to manage health-related needs will improve Outcome: Progressing   Problem: Metabolic: Goal: Ability to maintain appropriate glucose levels will improve Outcome: Progressing   Problem: Nutritional: Goal: Maintenance of adequate nutrition will improve Outcome: Progressing Goal: Progress toward achieving an optimal weight will improve Outcome: Progressing   Problem: Skin Integrity: Goal: Risk for impaired skin integrity will decrease Outcome: Progressing   Problem: Tissue Perfusion: Goal: Adequacy of tissue perfusion will improve Outcome: Progressing   Problem: Education: Goal: Knowledge of General Education information will improve Description: Including pain rating scale, medication(s)/side effects and non-pharmacologic comfort measures Outcome: Progressing   Problem: Health Behavior/Discharge Planning: Goal: Ability to manage health-related needs will improve Outcome: Progressing   Problem: Clinical Measurements: Goal: Ability to maintain clinical measurements within normal limits will improve Outcome: Progressing Goal: Will remain free from infection Outcome: Progressing Goal: Diagnostic test results will improve Outcome: Progressing Goal: Respiratory complications will improve Outcome: Progressing Goal: Cardiovascular complication will be avoided Outcome: Progressing   Problem: Activity: Goal: Risk for activity intolerance will decrease Outcome: Progressing   Problem: Nutrition: Goal: Adequate nutrition will be maintained Outcome: Progressing   Problem: Coping: Goal:  Level of anxiety will decrease Outcome: Progressing   Problem: Elimination: Goal: Will not experience complications related to bowel motility Outcome: Progressing Goal: Will not experience complications related to urinary retention Outcome: Progressing   Problem: Pain Management: Goal: General experience of comfort will improve Outcome: Progressing   Problem: Safety: Goal: Ability to remain free from injury will improve Outcome: Progressing   Problem: Skin Integrity: Goal: Risk for impaired skin integrity will decrease Outcome: Progressing   Problem: Activity: Goal: Ability to tolerate increased activity will improve Outcome: Progressing   Problem: Respiratory: Goal: Ability to maintain a clear airway and adequate ventilation will improve Outcome: Progressing

## 2024-03-24 NOTE — Progress Notes (Signed)
 PROGRESS NOTE   HPI was taken from NP [name]: 48 y.o with significant PMH of EtOH abuse ,Cocaine use, Homelessness, Uncontrolled HIV, on BIKTARVY, A-fib, GERD, Hypothyroidism, Diabetes, who presented to the ED with chief complaints of unresponsiveness.   Per ED reports, EMS report that patient was boarding at a friend's house who noticed that she had not woken up the whole day. Patient's friend attempted to wake her up but noticed that she was unresponsive and making "gurgling" sound so they called EMS. On EMS arrival, patient was found to be unconscious and incontinent of urine and feces.     ED Course: Initial vital signs showed HR of 118 beats/minute, BP 90/60 mm Hg, the RR 25 breaths/minute, and the oxygen saturation 98% on NRB and a temperature of 100.69F. Patient was obtunded with no gag reflex with debris around the mouth and nose concerning for stomach contents. She was emergently intubated for airway protection. Pertinent Labs/Diagnostics Findings: Na+/ K+:140/2.6  Glucose: 117 BUN/Cr.:33/1.18 AST/ALT:45/17 WBC:9.6 K/L PCT: 2.2 Lactic acid: 1.1 COVID PCR: Negative,  ABG: pO2 139; pCO2 27; pH 7.51;  HCO3 21.5, %O2 Sat 100.  CXR> see full report  Medication administered in the ED: Patient given 30 cc/kg of fluids and started on broad-spectrum antibiotics Ceftriaxone and Azithromycin for suspected sepsis Disposition:ICU  As per Dr. Mayford Knife 4/2-03/18/24: Pt has remained medically stable. Pt still lacks capacity to make medical decisions for herself as per psych. Guardianship is pending still. Unsafe d/c plan.    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 Poor historian and does not have capacity to make decisions as per psych. Still waiting on guardianship. Continue on biktarvy. Continue on atovaquone for PCP ppx  HIV associated neurocognitive disorder: MRI did not show any enhancing lesions but did show severe cerebral atrophy likely secondary to HIV/AIDS.    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 Continue w/ conservative management as per ortho surg. Continues to complain of pain, will reach out to ortho - via secure chat 4/9 dr. Dallas Schimke says no additional w/u unless visible signs of infection, of which there are none currently  Diarrhea: Resolved    Hypotension: BPs stable and wnl, will continue to hold midodrine and monitor   Social issues: homeless and has polysubstance use disorder (alcohol, cocaine).  Pt's boyfriend/fiance' is not able to visit the pt anymore b/c concern of him bringing the pt alcohol and/or illicit drugs. Awaiting guardianship still. Unsafe d/c plan    DVT prophylaxis:  lovenox Code Status: full  Family Communication: none Disposition Plan: no safe d/c plan. Waiting on guardianship  Level of care: Med-Surg  Status is: Inpatient Remains inpatient appropriate because: medically stable. Still waiting on guardianship and safe d/c plan    Consultants:  ID, orthopedics  Procedures:   Antimicrobials:  Subjective: Pt c/o hip pain, ongoing, stable. No other concerns today  Objective: Vitals:   03/24/24 0500 03/24/24 0505 03/24/24 0803 03/24/24 1507  BP:  113/79 100/70 102/64  Pulse:  90 91 (!) 102  Resp:  19 16 16   Temp:  98.1 F (36.7 C) 98.4 F (36.9 C) 97.9 F (36.6 C)  TempSrc:  Oral    SpO2:  100% 100% 98%  Weight: 84.1 kg     Height:        Intake/Output Summary (Last 24 hours) at 03/24/2024 1623 Last data filed at 03/24/2024 1300 Gross per 24 hour  Intake 240  ml  Output --  Net 240 ml      Filed Weights   03/21/24 0449 03/23/24 0500 03/24/24 0500  Weight: 82.3 kg 84.5 kg 84.1 kg    Examination:  General exam: appears calm & comfortable  Respiratory system: clear breath sounds b/l  Cardiovascular system: S1 & S2+. No rubs or gallops  Gastrointestinal system: abd is soft, NT, ND & hypoactive bowel sounds Central nervous system: alert & awake. Moves all extremities  Psychiatry: judgement and insight appears at baseline. Flat mood and affect    Data Reviewed: I have personally reviewed following labs and imaging studies  CBC: Recent Labs  Lab 03/18/24 0503  WBC 5.4  NEUTROABS 2.1  HGB 10.9*  HCT 34.0  MCV 84  PLT 272   Basic Metabolic Panel: No results for input(s): "NA", "K", "CL", "CO2", "GLUCOSE", "BUN", "CREATININE", "CALCIUM", "MG", "PHOS" in the last 168 hours.  GFR: Estimated Creatinine Clearance: 96.7 mL/min (by C-G formula based on SCr of 0.66 mg/dL). Liver Function Tests: No results for input(s): "AST", "ALT", "ALKPHOS", "BILITOT", "PROT", "ALBUMIN" in the last 168 hours.  No results for input(s): "LIPASE", "AMYLASE" in the last 168 hours. No results for input(s): "AMMONIA" in the last 168 hours. Coagulation Profile: No results for input(s): "INR", "PROTIME" in the last 168 hours. Cardiac Enzymes: No results for input(s): "CKTOTAL", "CKMB", "CKMBINDEX", "TROPONINI" in the last 168 hours.  BNP (last 3 results) No results for input(s): "PROBNP" in the last 8760 hours. HbA1C: No results for input(s): "HGBA1C" in the last 72 hours. CBG: No results for input(s): "GLUCAP" in the last 168 hours.  Lipid Profile: No results for input(s): "CHOL", "HDL", "LDLCALC", "TRIG", "CHOLHDL", "LDLDIRECT" in the last 72 hours. Thyroid Function Tests: No results for input(s): "TSH", "T4TOTAL", "FREET4", "T3FREE", "THYROIDAB" in the last 72 hours. Anemia Panel: No results for input(s): "VITAMINB12", "FOLATE", "FERRITIN",  "TIBC", "IRON", "RETICCTPCT" in the last 72 hours. Sepsis Labs: No results for input(s): "PROCALCITON", "LATICACIDVEN" in the last 168 hours.  No results found for this or any previous visit (from the past 240 hours).       Radiology Studies: No results found.      Scheduled Meds:  atovaquone  1,500 mg Oral Q breakfast   bictegravir-emtricitabine-tenofovir AF  1 tablet Oral Daily   DULoxetine  30 mg Oral QHS   enoxaparin (LOVENOX) injection  40 mg Subcutaneous QHS   folic acid  1 mg Oral Daily   hydrocerin   Topical BID   lidocaine  1 patch Transdermal Q24H   lipase/protease/amylase  24,000 Units Oral TID AC   multivitamin with minerals  1 tablet Oral QHS   pantoprazole  40 mg Oral BID   senna-docusate  2 tablet Oral BID   thiamine  100 mg Oral Daily   Continuous Infusions:   LOS: 107 days       Silvano Bilis, MD [facility]  If 7PM-7AM, please contact night-coverage [email or web address] Password [facility] 03/24/2024, 4:23 PM

## 2024-03-24 NOTE — TOC Progression Note (Signed)
 Transition of Care [facility]) - Progression Note    Patient Details  Name: BOYD LITAKER MRN: [id number] Date of Birth: 1976/01/26  [facility] CM/SW Contact  Odilia Bennett, LCSW Phone Number: 03/24/2024, 12:51 PM  Clinical Narrative:  CSW left voicemail for APS social worker to see if they have a date for the guardianship hearing.  Expected Discharge Plan and Services         Expected Discharge Date: 01/10/24                                     Social Determinants of Health (SDOH) Interventions SDOH Screenings   Food Insecurity: Patient Unable To Answer (12/09/2023)  Recent Concern: Food Insecurity - Food Insecurity Present (09/26/2023)  Housing: Patient Unable To Answer (12/09/2023)  Recent Concern: Housing - Medium Risk (09/26/2023)  Transportation Needs: Patient Unable To Answer (12/09/2023)  Recent Concern: Transportation Needs - Unmet Transportation Needs (10/11/2023)  Utilities: Patient Unable To Answer (12/09/2023)  Recent Concern: Utilities - At Risk (09/25/2023)  Tobacco Use: High Risk (12/08/2023)    Readmission Risk Interventions     No data to display

## 2024-03-25 DIAGNOSIS — B20 Human immunodeficiency virus [HIV] disease: Secondary | ICD-10-CM | POA: Diagnosis not present

## 2024-03-25 DIAGNOSIS — F02818 Dementia in other diseases classified elsewhere, unspecified severity, with other behavioral disturbance: Secondary | ICD-10-CM | POA: Diagnosis not present

## 2024-03-25 LAB — RESP PANEL BY RT-PCR (RSV, FLU A&B, COVID)  RVPGX2
Influenza A by PCR: NEGATIVE
Influenza B by PCR: NEGATIVE
Resp Syncytial Virus by PCR: NEGATIVE
SARS Coronavirus 2 by RT PCR: NEGATIVE

## 2024-03-25 NOTE — Plan of Care (Signed)
  Problem: Coping: Goal: Ability to adjust to condition or change in health will improve Outcome: Progressing   Problem: Fluid Volume: Goal: Ability to maintain a balanced intake and output will improve Outcome: Progressing   Problem: Health Behavior/Discharge Planning: Goal: Ability to identify and utilize available resources and services will improve Outcome: Progressing Goal: Ability to manage health-related needs will improve Outcome: Progressing   Problem: Metabolic: Goal: Ability to maintain appropriate glucose levels will improve Outcome: Progressing   Problem: Nutritional: Goal: Maintenance of adequate nutrition will improve Outcome: Progressing Goal: Progress toward achieving an optimal weight will improve Outcome: Progressing   Problem: Skin Integrity: Goal: Risk for impaired skin integrity will decrease Outcome: Progressing   Problem: Tissue Perfusion: Goal: Adequacy of tissue perfusion will improve Outcome: Progressing   Problem: Education: Goal: Knowledge of General Education information will improve Description: Including pain rating scale, medication(s)/side effects and non-pharmacologic comfort measures Outcome: Progressing   Problem: Health Behavior/Discharge Planning: Goal: Ability to manage health-related needs will improve Outcome: Progressing   Problem: Clinical Measurements: Goal: Ability to maintain clinical measurements within normal limits will improve Outcome: Progressing Goal: Will remain free from infection Outcome: Progressing Goal: Diagnostic test results will improve Outcome: Progressing Goal: Respiratory complications will improve Outcome: Progressing Goal: Cardiovascular complication will be avoided Outcome: Progressing   Problem: Activity: Goal: Risk for activity intolerance will decrease Outcome: Progressing   Problem: Nutrition: Goal: Adequate nutrition will be maintained Outcome: Progressing   Problem: Coping: Goal:  Level of anxiety will decrease Outcome: Progressing   Problem: Elimination: Goal: Will not experience complications related to bowel motility Outcome: Progressing Goal: Will not experience complications related to urinary retention Outcome: Progressing   Problem: Pain Management: Goal: General experience of comfort will improve Outcome: Progressing   Problem: Safety: Goal: Ability to remain free from injury will improve Outcome: Progressing   Problem: Skin Integrity: Goal: Risk for impaired skin integrity will decrease Outcome: Progressing   Problem: Activity: Goal: Ability to tolerate increased activity will improve Outcome: Progressing   Problem: Respiratory: Goal: Ability to maintain a clear airway and adequate ventilation will improve Outcome: Progressing

## 2024-03-25 NOTE — Progress Notes (Signed)
 PROGRESS NOTE   HPI was taken from NP [name]: 48 y.o with significant PMH of EtOH abuse ,Cocaine use, Homelessness, Uncontrolled HIV, on BIKTARVY, A-fib, GERD, Hypothyroidism, Diabetes, who presented to the ED with chief complaints of unresponsiveness.   Per ED reports, EMS report that patient was boarding at a friend's house who noticed that she had not woken up the whole day. Patient's friend attempted to wake her up but noticed that she was unresponsive and making "gurgling" sound so they called EMS. On EMS arrival, patient was found to be unconscious and incontinent of urine and feces.     ED Course: Initial vital signs showed HR of 118 beats/minute, BP 90/60 mm Hg, the RR 25 breaths/minute, and the oxygen saturation 98% on NRB and a temperature of 100.41F. Patient was obtunded with no gag reflex with debris around the mouth and nose concerning for stomach contents. She was emergently intubated for airway protection. Pertinent Labs/Diagnostics Findings: Na+/ K+:140/2.6  Glucose: 117 BUN/Cr.:33/1.18 AST/ALT:45/17 WBC:9.6 K/L PCT: 2.2 Lactic acid: 1.1 COVID PCR: Negative,  ABG: pO2 139; pCO2 27; pH 7.51;  HCO3 21.5, %O2 Sat 100.  CXR> see full report  Medication administered in the ED: Patient given 30 cc/kg of fluids and started on broad-spectrum antibiotics Ceftriaxone and Azithromycin for suspected sepsis Disposition:ICU  As per Dr. Mayford Knife 4/2-03/18/24: Pt has remained medically stable. Pt still lacks capacity to make medical decisions for herself as per psych. Guardianship is pending still. Unsafe d/c plan.    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 Poor historian and does not have capacity to make decisions as per psych. Still waiting on guardianship. Continue on biktarvy. Continue on atovaquone for PCP ppx  Nasal congestion: new today, lungs clear no hypoxia, says she feels like she has a "cold." Will check covid/flu/rsv, monitor.   HIV associated neurocognitive disorder: MRI did not show any enhancing lesions but did show severe cerebral atrophy likely secondary to HIV/AIDS.    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 Continue w/ conservative management as per ortho surg. Continues to complain of pain, will reach out to ortho - via secure chat 4/9 dr. Dallas Schimke says no additional w/u unless visible signs of infection, of which there are none currently  Diarrhea: Resolved    Hypotension: BPs stable and wnl, will continue to hold midodrine and monitor   Social issues: homeless and has polysubstance use disorder (alcohol, cocaine).  Pt's boyfriend/fiance' is not able to visit the pt anymore b/c concern of him bringing the pt alcohol and/or illicit drugs. Awaiting guardianship still. Unsafe d/c plan    DVT prophylaxis:  lovenox Code Status: full  Family Communication: none Disposition Plan: no safe d/c plan. Waiting on guardianship  Level of care: Med-Surg  Status is: Inpatient Remains inpatient appropriate because: medically stable. Still waiting on guardianship and safe d/c plan    Consultants:  ID, orthopedics  Procedures:   Antimicrobials:  Subjective: Pt c/o hip pain, ongoing, stable. New stuffy nose, feels congested  Objective: Vitals:   03/25/24 0237 03/25/24 0500 03/25/24 0759 03/25/24  0800  BP: 118/70  112/80   Pulse: 92  91 90  Resp: 16  16 (!) 22  Temp: 97.9 F (36.6 C)   98.4 F (36.9 C)  TempSrc:    Oral  SpO2: 97%  100% 99%  Weight:  85.5 kg    Height:         Intake/Output Summary (Last 24 hours) at 03/25/2024 1351 Last data filed at 03/25/2024 1054 Gross per 24 hour  Intake 0 ml  Output --  Net 0 ml      Filed Weights   03/23/24 0500 03/24/24 0500 03/25/24 0500  Weight: 84.5 kg 84.1 kg 85.5 kg    Examination:  General exam: appears calm & comfortable  Respiratory system: clear breath sounds b/l  Cardiovascular system: S1 & S2+. No rubs or gallops  Gastrointestinal system: abd is soft, NT, ND & hypoactive bowel sounds Central nervous system: alert & awake. Moves all extremities  Psychiatry: judgement and insight appears at baseline. Flat mood and affect    Data Reviewed: I have personally reviewed following labs and imaging studies  CBC: No results for input(s): "WBC", "NEUTROABS", "HGB", "HCT", "MCV", "PLT" in the last 168 hours.  Basic Metabolic Panel: No results for input(s): "NA", "K", "CL", "CO2", "GLUCOSE", "BUN", "CREATININE", "CALCIUM", "MG", "PHOS" in the last 168 hours.  GFR: Estimated Creatinine Clearance: 97.4 mL/min (by C-G formula based on SCr of 0.66 mg/dL). Liver Function Tests: No results for input(s): "AST", "ALT", "ALKPHOS", "BILITOT", "PROT", "ALBUMIN" in the last 168 hours.  No results for input(s): "LIPASE", "AMYLASE" in the last 168 hours. No results for input(s): "AMMONIA" in the last 168 hours. Coagulation Profile: No results for input(s): "INR", "PROTIME" in the last 168 hours. Cardiac Enzymes: No results for input(s): "CKTOTAL", "CKMB", "CKMBINDEX", "TROPONINI" in the last 168 hours.  BNP (last 3 results) No results for input(s): "PROBNP" in the last 8760 hours. HbA1C: No results for input(s): "HGBA1C" in the last 72 hours. CBG: No results for input(s): "GLUCAP" in the last 168 hours.  Lipid Profile: No results for input(s): "CHOL", "HDL", "LDLCALC", "TRIG", "CHOLHDL", "LDLDIRECT" in the last 72 hours. Thyroid Function Tests: No results for input(s): "TSH", "T4TOTAL", "FREET4",  "T3FREE", "THYROIDAB" in the last 72 hours. Anemia Panel: No results for input(s): "VITAMINB12", "FOLATE", "FERRITIN", "TIBC", "IRON", "RETICCTPCT" in the last 72 hours. Sepsis Labs: No results for input(s): "PROCALCITON", "LATICACIDVEN" in the last 168 hours.  No results found for this or any previous visit (from the past 240 hours).       Radiology Studies: No results found.      Scheduled Meds:  atovaquone  1,500 mg Oral Q breakfast   bictegravir-emtricitabine-tenofovir AF  1 tablet Oral Daily   DULoxetine  30 mg Oral QHS   enoxaparin (LOVENOX) injection  40 mg Subcutaneous QHS   folic acid  1 mg Oral Daily   hydrocerin   Topical BID   lidocaine  1 patch Transdermal Q24H   lipase/protease/amylase  24,000 Units Oral TID AC   multivitamin with minerals  1 tablet Oral QHS   pantoprazole  40 mg Oral BID   senna-docusate  2 tablet Oral BID   thiamine  100 mg Oral Daily   Continuous Infusions:   LOS: 108 days       Silvano Bilis, MD [facility]  If 7PM-7AM, please contact night-coverage [email or web address] Password [email or web address] 03/25/2024, 1:51 PM

## 2024-03-26 DIAGNOSIS — F02818 Dementia in other diseases classified elsewhere, unspecified severity, with other behavioral disturbance: Secondary | ICD-10-CM | POA: Diagnosis not present

## 2024-03-26 DIAGNOSIS — B20 Human immunodeficiency virus [HIV] disease: Secondary | ICD-10-CM | POA: Diagnosis not present

## 2024-03-26 NOTE — Plan of Care (Signed)
  Problem: Coping: Goal: Ability to adjust to condition or change in health will improve Outcome: Progressing   Problem: Fluid Volume: Goal: Ability to maintain a balanced intake and output will improve Outcome: Progressing   Problem: Health Behavior/Discharge Planning: Goal: Ability to identify and utilize available resources and services will improve Outcome: Progressing Goal: Ability to manage health-related needs will improve Outcome: Progressing   Problem: Metabolic: Goal: Ability to maintain appropriate glucose levels will improve Outcome: Progressing   Problem: Nutritional: Goal: Maintenance of adequate nutrition will improve Outcome: Progressing Goal: Progress toward achieving an optimal weight will improve Outcome: Progressing   Problem: Skin Integrity: Goal: Risk for impaired skin integrity will decrease Outcome: Progressing   Problem: Tissue Perfusion: Goal: Adequacy of tissue perfusion will improve Outcome: Progressing   Problem: Education: Goal: Knowledge of General Education information will improve Description: Including pain rating scale, medication(s)/side effects and non-pharmacologic comfort measures Outcome: Progressing   Problem: Health Behavior/Discharge Planning: Goal: Ability to manage health-related needs will improve Outcome: Progressing   Problem: Clinical Measurements: Goal: Ability to maintain clinical measurements within normal limits will improve Outcome: Progressing Goal: Will remain free from infection Outcome: Progressing Goal: Diagnostic test results will improve Outcome: Progressing Goal: Respiratory complications will improve Outcome: Progressing Goal: Cardiovascular complication will be avoided Outcome: Progressing   Problem: Activity: Goal: Risk for activity intolerance will decrease Outcome: Progressing   Problem: Nutrition: Goal: Adequate nutrition will be maintained Outcome: Progressing   Problem: Coping: Goal:  Level of anxiety will decrease Outcome: Progressing   Problem: Elimination: Goal: Will not experience complications related to bowel motility Outcome: Progressing Goal: Will not experience complications related to urinary retention Outcome: Progressing   Problem: Pain Management: Goal: General experience of comfort will improve Outcome: Progressing   Problem: Safety: Goal: Ability to remain free from injury will improve Outcome: Progressing   Problem: Skin Integrity: Goal: Risk for impaired skin integrity will decrease Outcome: Progressing   Problem: Activity: Goal: Ability to tolerate increased activity will improve Outcome: Progressing   Problem: Respiratory: Goal: Ability to maintain a clear airway and adequate ventilation will improve Outcome: Progressing

## 2024-03-26 NOTE — Progress Notes (Signed)
 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 Poor historian and does not have capacity to make decisions as per psych. Continue on biktarvy. Continue on atovaquone for PCP ppx   HIV associated neurocognitive disorder: MRI did not show any enhancing lesions but did show severe cerebral atrophy likely secondary to HIV/AIDS.    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 Continue w/ conservative management as per ortho surg   Diarrhea: Resolved    Hypotension: resolved. Holding midodrine    Social issues: homeless and has polysubstance use disorder (alcohol, cocaine).  Pt's boyfriend/fiance' is not able to visit the pt anymore b/c concern of him bringing the pt alcohol and/or illicit drugs. Still awaiting guardianship. Unsafe d/c plan      DVT prophylaxis: lovenox Code Status: full  Family Communication:  Disposition Plan: awaiting guardianship still. Unsafe d/c plan  Level of care: Med-Surg Consultants:  Psych  Ortho surg    Procedures:  Antimicrobials:   Subjective: Pt c/o pain   Objective: Vitals:   03/25/24 1644 03/25/24 2020 03/26/24 0535 03/26/24 0811  BP: 111/77 116/80 104/73 115/85  Pulse: (!) 102 (!) 102 89 85  Resp: 16 20 20 18   Temp: 99 F (37.2 C) 98.8 F (37.1 C) 98.3 F (36.8 C) 98.2 F (36.8 C)  TempSrc:  Oral Oral   SpO2: 98% 98% 100% 100%  Weight:      Height:        Intake/Output Summary (Last 24 hours) at 03/26/2024 0812 Last data filed at 03/26/2024 0630 Gross per 24  hour  Intake 960 ml  Output 0 ml  Net 960 ml   Filed Weights   03/23/24 0500 03/24/24 0500 03/25/24 0500  Weight: 84.5 kg 84.1 kg 85.5 kg    Examination:  General exam: Appears calm and comfortable  Respiratory system: Clear to auscultation. Respiratory effort normal. Cardiovascular system: S1 & +. No rubs, gallops or clicks.  Gastrointestinal system: Abdomen is nondistended, soft and nontender. Normal bowel sounds heard. Central nervous system: Alert and awake. Moves all extremities  Psychiatry: Judgement and insight appears poor. Flat mood and affect    Data Reviewed: I have personally reviewed following labs and imaging studies  CBC: No results for input(s): "WBC", "NEUTROABS", "HGB", "HCT", "MCV", "PLT" in the last 168 hours. Basic Metabolic Panel: No results for input(s): "NA", "K", "CL", "CO2", "GLUCOSE", "BUN", "CREATININE", "CALCIUM", "MG", "PHOS" in the last 168 hours. GFR: Estimated Creatinine Clearance: 97.4 mL/min (by C-G formula based on SCr of 0.66 mg/dL). Liver Function Tests: No results for input(s): "AST", "ALT", "ALKPHOS", "BILITOT", "PROT", "ALBUMIN" in the last 168 hours. No results for input(s): "LIPASE", "AMYLASE" in the last 168 hours. No results for input(s): "AMMONIA" in the last 168 hours. Coagulation Profile: No results for input(s): "INR", "PROTIME" in the last 168 hours. Cardiac Enzymes: No results for input(s): "CKTOTAL", "CKMB", "CKMBINDEX", "TROPONINI" in the last 168 hours. BNP (last 3 results) No results for input(s): "PROBNP" in the last 8760 hours. HbA1C: No results for input(s): "HGBA1C" in the last 72  hours. CBG: No results for input(s): "GLUCAP" in the last 168 hours. Lipid Profile: No results for input(s): "CHOL", "HDL", "LDLCALC", "TRIG", "CHOLHDL", "LDLDIRECT" in the last 72 hours. Thyroid Function Tests: No results for input(s): "TSH", "T4TOTAL", "FREET4", "T3FREE", "THYROIDAB" in the last 72 hours. Anemia Panel: No results  for input(s): "VITAMINB12", "FOLATE", "FERRITIN", "TIBC", "IRON", "RETICCTPCT" in the last 72 hours. Sepsis Labs: No results for input(s): "PROCALCITON", "LATICACIDVEN" in the last 168 hours.  Recent Results (from the past 240 hours)  Resp panel by RT-PCR (RSV, Flu A&B, Covid) Anterior Nasal Swab     Status: None   Collection Time: 03/25/24  1:55 PM   Specimen: Anterior Nasal Swab  Result Value Ref Range Status   SARS Coronavirus 2 by RT PCR NEGATIVE NEGATIVE Final    Comment: (NOTE) SARS-CoV-2 target nucleic acids are NOT DETECTED.  The SARS-CoV-2 RNA is generally detectable in upper respiratory specimens during the acute phase of infection. The lowest concentration of SARS-CoV-2 viral copies this assay can detect is 138 copies/mL. A negative result does not preclude SARS-Cov-2 infection and should not be used as the sole basis for treatment or other patient management decisions. A negative result may occur with  improper specimen collection/handling, submission of specimen other than nasopharyngeal swab, presence of viral mutation(s) within the areas targeted by this assay, and inadequate number of viral copies(<138 copies/mL). A negative result must be combined with clinical observations, patient history, and epidemiological information. The expected result is Negative.  Fact Sheet for Patients:  BloggerCourse.com  Fact Sheet for Healthcare Providers:  SeriousBroker.it  This test is no t yet approved or cleared by the [place]  FDA and  has been authorized for detection and/or diagnosis of SARS-CoV-2 by FDA under an Emergency Use Authorization (EUA). This EUA will remain  in effect (meaning this test can be used) for the duration of the COVID-19 declaration under Section 564(b)(1) of the Act, 21 U.S.C.section 360bbb-3(b)(1), unless the authorization is terminated  or revoked sooner.       Influenza A by PCR NEGATIVE  NEGATIVE Final   Influenza B by PCR NEGATIVE NEGATIVE Final    Comment: (NOTE) The Xpert Xpress SARS-CoV-2/FLU/RSV plus assay is intended as an aid in the diagnosis of influenza from Nasopharyngeal swab specimens and should not be used as a sole basis for treatment. Nasal washings and aspirates are unacceptable for Xpert Xpress SARS-CoV-2/FLU/RSV testing.  Fact Sheet for Patients: BloggerCourse.com  Fact Sheet for Healthcare Providers: SeriousBroker.it  This test is not yet approved or cleared by the [place]  FDA and has been authorized for detection and/or diagnosis of SARS-CoV-2 by FDA under an Emergency Use Authorization (EUA). This EUA will remain in effect (meaning this test can be used) for the duration of the COVID-19 declaration under Section 564(b)(1) of the Act, 21 U.S.C. section 360bbb-3(b)(1), unless the authorization is terminated or revoked.     Resp Syncytial Virus by PCR NEGATIVE NEGATIVE Final    Comment: (NOTE) Fact Sheet for Patients: BloggerCourse.com  Fact Sheet for Healthcare Providers: SeriousBroker.it  This test is not yet approved or cleared by the [place]  FDA and has been authorized for detection and/or diagnosis of SARS-CoV-2 by FDA under an Emergency Use Authorization (EUA). This EUA will remain in effect (meaning this test can be used) for the duration of the COVID-19 declaration under Section 564(b)(1) of the Act, 21 U.S.C. section 360bbb-3(b)(1), unless the authorization is terminated or revoked.  Performed at [facility], [address]  [place], [place] [address]          Radiology Studies: No results found.      Scheduled Meds:  atovaquone  1,500 mg Oral Q breakfast   bictegravir-emtricitabine-tenofovir AF  1 tablet Oral Daily   DULoxetine  30 mg Oral QHS   enoxaparin (LOVENOX) injection  40 mg  Subcutaneous QHS   folic acid  1 mg Oral Daily   hydrocerin   Topical BID   lidocaine  1 patch Transdermal Q24H   lipase/protease/amylase  24,000 Units Oral TID AC   multivitamin with minerals  1 tablet Oral QHS   pantoprazole  40 mg Oral BID   senna-docusate  2 tablet Oral BID   thiamine  100 mg Oral Daily   Continuous Infusions:   LOS: 109 days       Alphonsus Jeans, MD [facility] Pager [phone]  If 7PM-7AM, please contact night-coverage [email or web address] 03/26/2024, 8:12 AM

## 2024-03-26 NOTE — TOC Progression Note (Signed)
 Transition of Care [facility]) - Progression Note    Patient Details  Name: Meghan Jarvis MRN: [id number] Date of Birth: 09-06-1976  [facility] CM/SW Contact  Odilia Bennett, LCSW Phone Number: 03/26/2024, 10:24 AM  Clinical Narrative:   DSS does not have a date for the court hearing yet. Social worker stated she did speak to patient's two children but they have not seen her in quite some time.  Expected Discharge Plan and Services         Expected Discharge Date: 01/10/24                                     Social Determinants of Health (SDOH) Interventions SDOH Screenings   Food Insecurity: Patient Unable To Answer (12/09/2023)  Recent Concern: Food Insecurity - Food Insecurity Present (09/26/2023)  Housing: Patient Unable To Answer (12/09/2023)  Recent Concern: Housing - Medium Risk (09/26/2023)  Transportation Needs: Patient Unable To Answer (12/09/2023)  Recent Concern: Transportation Needs - Unmet Transportation Needs (10/11/2023)  Utilities: Patient Unable To Answer (12/09/2023)  Recent Concern: Utilities - At Risk (09/25/2023)  Tobacco Use: High Risk (12/08/2023)    Readmission Risk Interventions     No data to display

## 2024-03-27 DIAGNOSIS — B20 Human immunodeficiency virus [HIV] disease: Secondary | ICD-10-CM | POA: Diagnosis not present

## 2024-03-27 DIAGNOSIS — F02818 Dementia in other diseases classified elsewhere, unspecified severity, with other behavioral disturbance: Secondary | ICD-10-CM | POA: Diagnosis not present

## 2024-03-27 LAB — C-REACTIVE PROTEIN: CRP: 0.7 mg/dL (ref <1.0)

## 2024-03-27 LAB — SEDIMENTATION RATE: Sed Rate: 35 mm/h — ABNORMAL HIGH (ref 0–20)

## 2024-03-27 NOTE — Progress Notes (Signed)
 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 Poor historian and does not have capacity to make decisions as per psych. Continue on biktarvy. Continue on atovaquone for PCP ppx   HIV associated neurocognitive disorder: MRI did not show any enhancing lesions but did show severe cerebral atrophy likely secondary to HIV/AIDS.    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 Continue w/ conservative management as per ortho surg   Diarrhea: Resolved    Hypotension: resolved. Holding midodrine    Social issues: homeless and has polysubstance use disorder (alcohol, cocaine).  Pt's boyfriend/fiance' is not able to visit the pt anymore b/c concern of him bringing the pt alcohol and/or illicit drugs. Still awaiting guardianship. Unsafe d/c plan      DVT prophylaxis: lovenox Code Status: full  Family Communication:  Disposition Plan: awaiting guardianship still. Unsafe d/c plan   Level of care: Med-Surg Consultants:  Psych  Ortho surg    Procedures:  Antimicrobials:   Subjective: Pt c/o fatigue  Objective: Vitals:   03/26/24 0811 03/26/24 1505 03/26/24 2257 03/27/24 0330  BP: 115/85 124/87 99/84 105/72  Pulse: 85 96 88 94  Resp: 18 20 16 16   Temp: 98.2 F (36.8 C) 97.8 F (36.6 C) 98 F (36.7 C) 97.6 F (36.4 C)  TempSrc:  Oral Oral Oral  SpO2: 100% 100% 100% 99%  Weight:      Height:        Intake/Output Summary (Last 24 hours) at 03/27/2024 0824 Last data filed at 03/26/2024 1043 Gross per 24 hour   Intake 240 ml  Output --  Net 240 ml   Filed Weights   03/23/24 0500 03/24/24 0500 03/25/24 0500  Weight: 84.5 kg 84.1 kg 85.5 kg    Examination:  General exam: Appears frustrated Respiratory system: clear breath sounds b/l  Cardiovascular system: S1/S2+. No rubs or clicks Gastrointestinal system: Abd is soft, NT, ND & hypoactive bowel sounds  Central nervous system: Alert & awake. Moves all extremities  Psychiatry: judgement and insight appears poor. Flat mood and affect     Data Reviewed: I have personally reviewed following labs and imaging studies  CBC: No results for input(s): "WBC", "NEUTROABS", "HGB", "HCT", "MCV", "PLT" in the last 168 hours. Basic Metabolic Panel: No results for input(s): "NA", "K", "CL", "CO2", "GLUCOSE", "BUN", "CREATININE", "CALCIUM", "MG", "PHOS" in the last 168 hours. GFR: Estimated Creatinine Clearance: 97.4 mL/min (by C-G formula based on SCr of 0.66 mg/dL). Liver Function Tests: No results for input(s): "AST", "ALT", "ALKPHOS", "BILITOT", "PROT", "ALBUMIN" in the last 168 hours. No results for input(s): "LIPASE", "AMYLASE" in the last 168 hours. No results for input(s): "AMMONIA" in the last 168 hours. Coagulation Profile: No results for input(s): "INR", "PROTIME" in the last 168 hours. Cardiac Enzymes: No results for input(s): "CKTOTAL", "CKMB", "CKMBINDEX", "TROPONINI" in the last 168 hours. BNP (last 3 results) No results for input(s): "PROBNP" in the last 8760 hours. HbA1C: No results for input(s): "HGBA1C" in the last 72 hours. CBG: No results for input(s): "GLUCAP" in the last  168 hours. Lipid Profile: No results for input(s): "CHOL", "HDL", "LDLCALC", "TRIG", "CHOLHDL", "LDLDIRECT" in the last 72 hours. Thyroid Function Tests: No results for input(s): "TSH", "T4TOTAL", "FREET4", "T3FREE", "THYROIDAB" in the last 72 hours. Anemia Panel: No results for input(s): "VITAMINB12", "FOLATE", "FERRITIN", "TIBC", "IRON", "RETICCTPCT" in  the last 72 hours. Sepsis Labs: No results for input(s): "PROCALCITON", "LATICACIDVEN" in the last 168 hours.  Recent Results (from the past 240 hours)  Resp panel by RT-PCR (RSV, Flu A&B, Covid) Anterior Nasal Swab     Status: None   Collection Time: 03/25/24  1:55 PM   Specimen: Anterior Nasal Swab  Result Value Ref Range Status   SARS Coronavirus 2 by RT PCR NEGATIVE NEGATIVE Final    Comment: (NOTE) SARS-CoV-2 target nucleic acids are NOT DETECTED.  The SARS-CoV-2 RNA is generally detectable in upper respiratory specimens during the acute phase of infection. The lowest concentration of SARS-CoV-2 viral copies this assay can detect is 138 copies/mL. A negative result does not preclude SARS-Cov-2 infection and should not be used as the sole basis for treatment or other patient management decisions. A negative result may occur with  improper specimen collection/handling, submission of specimen other than nasopharyngeal swab, presence of viral mutation(s) within the areas targeted by this assay, and inadequate number of viral copies(<138 copies/mL). A negative result must be combined with clinical observations, patient history, and epidemiological information. The expected result is Negative.  Fact Sheet for Patients:  BloggerCourse.com  Fact Sheet for Healthcare Providers:  SeriousBroker.it  This test is no t yet approved or cleared by the [place] FDA and  has been authorized for detection and/or diagnosis of SARS-CoV-2 by FDA under an Emergency Use Authorization (EUA). This EUA will remain  in effect (meaning this test can be used) for the duration of the COVID-19 declaration under Section 564(b)(1) of the Act, 21 U.S.C.section 360bbb-3(b)(1), unless the authorization is terminated  or revoked sooner.       Influenza A by PCR NEGATIVE NEGATIVE Final   Influenza B by PCR NEGATIVE NEGATIVE Final    Comment: (NOTE) The  Xpert Xpress SARS-CoV-2/FLU/RSV plus assay is intended as an aid in the diagnosis of influenza from Nasopharyngeal swab specimens and should not be used as a sole basis for treatment. Nasal washings and aspirates are unacceptable for Xpert Xpress SARS-CoV-2/FLU/RSV testing.  Fact Sheet for Patients: BloggerCourse.com  Fact Sheet for Healthcare Providers: SeriousBroker.it  This test is not yet approved or cleared by the [place] FDA and has been authorized for detection and/or diagnosis of SARS-CoV-2 by FDA under an Emergency Use Authorization (EUA). This EUA will remain in effect (meaning this test can be used) for the duration of the COVID-19 declaration under Section 564(b)(1) of the Act, 21 U.S.C. section 360bbb-3(b)(1), unless the authorization is terminated or revoked.     Resp Syncytial Virus by PCR NEGATIVE NEGATIVE Final    Comment: (NOTE) Fact Sheet for Patients: BloggerCourse.com  Fact Sheet for Healthcare Providers: SeriousBroker.it  This test is not yet approved or cleared by the [place] FDA and has been authorized for detection and/or diagnosis of SARS-CoV-2 by FDA under an Emergency Use Authorization (EUA). This EUA will remain in effect (meaning this test can be used) for the duration of the COVID-19 declaration under Section 564(b)(1) of the Act, 21 U.S.C. section 360bbb-3(b)(1), unless the authorization is terminated or revoked.  Performed at [facility], [address], [place], [place] [address]  Radiology Studies: No results found.      Scheduled Meds:  atovaquone  1,500 mg Oral Q breakfast   bictegravir-emtricitabine-tenofovir AF  1 tablet Oral Daily   DULoxetine  30 mg Oral QHS   enoxaparin (LOVENOX) injection  40 mg Subcutaneous QHS   folic acid  1 mg Oral Daily   hydrocerin   Topical BID   lidocaine  1 patch  Transdermal Q24H   lipase/protease/amylase  24,000 Units Oral TID AC   multivitamin with minerals  1 tablet Oral QHS   pantoprazole  40 mg Oral BID   senna-docusate  2 tablet Oral BID   thiamine  100 mg Oral Daily   Continuous Infusions:   LOS: 110 days       Alphonsus Jeans, MD [facility] Pager [phone]  If 7PM-7AM, please contact night-coverage [email or web address] 03/27/2024, 8:24 AM

## 2024-03-27 NOTE — Plan of Care (Signed)
  Problem: Coping: Goal: Ability to adjust to condition or change in health will improve Outcome: Progressing   Problem: Skin Integrity: Goal: Risk for impaired skin integrity will decrease Outcome: Progressing   Pt reporting 8-10/10 pain in right leg that radiates to back. PRN ibuprofen, flexeril, and tylenol administered with some relief.

## 2024-03-28 DIAGNOSIS — F02818 Dementia in other diseases classified elsewhere, unspecified severity, with other behavioral disturbance: Secondary | ICD-10-CM | POA: Diagnosis not present

## 2024-03-28 DIAGNOSIS — B20 Human immunodeficiency virus [HIV] disease: Secondary | ICD-10-CM | POA: Diagnosis not present

## 2024-03-28 NOTE — Progress Notes (Signed)
 Brief Nutrition Follow-Up Note  Wt Readings from Last 15 Encounters:  03/28/24 86.8 kg  12/06/23 45.4 kg  11/28/23 49 kg  11/04/23 49 kg  09/23/23 49.9 kg  07/12/22 90.7 kg  01/30/22 90.7 kg  06/07/21 95 kg  06/06/21 95.3 kg  03/22/21 95.3 kg  01/27/21 72.6 kg  08/15/20 90.7 kg  05/09/20 90.7 kg  09/29/19 81.6 kg  11/23/18 99.8 kg   48 y/o female with h/o homelessness, HIV, substance abuse, Afib, mood disorder, hypothyroidism, type 2 diabetes mellitus and depression who is admitted with aspiration PNA.   Per psych note on 01/28/24 and most recent visit on 03/17/24, pt does not have capacity to make medical decisions. Per TOC notes, DSS guardianship pending.   Current diet order is dysphagia 3, patient is consuming approximately 50-100% of meals at this time.   Medications reviewed and include biktarvy , lovenox , protonix , and senokot.   Noted progressive wt gain over the past month, which is favorable given pt's malnutrition.   Labs reviewed: CBGS: 117.    INTERVENTION:    -Continue MVI with minerals daily -Continue 1 mg folic acid  daily -Continue 100 mg thiamine  daily -Continue Magic cup TID with meals, each supplement provides 290 kcal and 9 grams of protein    NUTRITION DIAGNOSIS:    Severe Malnutrition related to chronic illness (HIV, homelessness, substance abuse) as evidenced by percent weight loss, severe fat depletion, severe muscle depletion.   Ongoing   GOAL:    Patient will meet greater than or equal to 90% of their needs   Progressing  No nutrition interventions warranted at this time. If nutrition issues arise, please consult RD.   Herschel Lords, RD, LDN, CDCES Registered Dietitian III Certified Diabetes Care and Education Specialist If unable to reach this RD, please use "RD Inpatient" group chat on secure chat between hours of 8am-4 pm daily

## 2024-03-28 NOTE — Progress Notes (Signed)
 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 Poor historian and does not have capacity to make decisions as per psych. Continue on biktarvy . Continue on atovaquone  for PCP ppx   HIV associated neurocognitive disorder: MRI did not show any enhancing lesions but did show severe cerebral atrophy likely secondary to HIV/AIDS.    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 Continue w/ conservative management as per ortho surg   Diarrhea: Resolved    Hypotension: resolved. Holding midodrine     Social issues: homeless and has polysubstance use disorder (alcohol, cocaine).  Pt's boyfriend/fiance' is not able to visit the pt anymore b/c concern of him bringing the pt alcohol and/or illicit drugs. Still awaiting guardianship. Unsafe d/c plan      DVT prophylaxis: lovenox  Code Status: full  Family Communication:  Disposition Plan: awaiting guardianship still. Unsafe d/c plan   Level of care: Med-Surg Consultants:  Psych  Ortho surg    Procedures:  Antimicrobials:   Subjective: Pt denies any complaints   Objective: Vitals:   03/27/24 1533 03/27/24 2037 03/28/24 0357 03/28/24 0500  BP: 105/71 107/74 102/68   Pulse: 73 97 97   Resp: 16 20 16    Temp: 98.4 F (36.9 C) 98.4 F (36.9 C) 97.9 F (36.6 C)   TempSrc: Oral Oral    SpO2: 100% 99% 100%   Weight:    86.8 kg  Height:        Intake/Output Summary (Last 24 hours) at 03/28/2024 0830 Last data filed at 03/27/2024 1932 Gross per 24 hour  Intake 480  ml  Output --  Net 480 ml   Filed Weights   03/24/24 0500 03/25/24 0500 03/28/24 0500  Weight: 84.1 kg 85.5 kg 86.8 kg    Examination:  General exam: Appears calm & comfortable  Respiratory system: clear breath sounds b/l  Cardiovascular system: S1 & S2+. No rubs or gallops  Gastrointestinal system: abd is soft, NT, ND & hypoactive bowel sounds  Central nervous system: alert & awake. Moves all extremities Psychiatry: judgement and insight appears poor. Flat mood and affect    Data Reviewed: I have personally reviewed following labs and imaging studies  CBC: No results for input(s): "WBC", "NEUTROABS", "HGB", "HCT", "MCV", "PLT" in the last 168 hours. Basic Metabolic Panel: No results for input(s): "NA", "K", "CL", "CO2", "GLUCOSE", "BUN", "CREATININE", "CALCIUM ", "MG", "PHOS" in the last 168 hours. GFR: Estimated Creatinine Clearance: 98.2 mL/min (by C-G formula based on SCr of 0.66 mg/dL). Liver Function Tests: No results for input(s): "AST", "ALT", "ALKPHOS", "BILITOT", "PROT", "ALBUMIN" in the last 168 hours. No results for input(s): "LIPASE", "AMYLASE" in the last 168 hours. No results for input(s): "AMMONIA" in the last 168 hours. Coagulation Profile: No results for input(s): "INR", "PROTIME" in the last 168 hours. Cardiac Enzymes: No results for input(s): "CKTOTAL", "CKMB", "CKMBINDEX", "TROPONINI" in the last 168 hours. BNP (last 3 results) No results for input(s): "PROBNP" in the last 8760 hours. HbA1C: No results for input(s): "HGBA1C" in the last 72 hours. CBG: No results for input(s): "  GLUCAP" in the last 168 hours. Lipid Profile: No results for input(s): "CHOL", "HDL", "LDLCALC", "TRIG", "CHOLHDL", "LDLDIRECT" in the last 72 hours. Thyroid Function Tests: No results for input(s): "TSH", "T4TOTAL", "FREET4", "T3FREE", "THYROIDAB" in the last 72 hours. Anemia Panel: No results for input(s): "VITAMINB12", "FOLATE", "FERRITIN", "TIBC", "IRON", "RETICCTPCT" in  the last 72 hours. Sepsis Labs: No results for input(s): "PROCALCITON", "LATICACIDVEN" in the last 168 hours.  Recent Results (from the past 240 hours)  Resp panel by RT-PCR (RSV, Flu A&B, Covid) Anterior Nasal Swab     Status: None   Collection Time: 03/25/24  1:55 PM   Specimen: Anterior Nasal Swab  Result Value Ref Range Status   SARS Coronavirus 2 by RT PCR NEGATIVE NEGATIVE Final    Comment: (NOTE) SARS-CoV-2 target nucleic acids are NOT DETECTED.  The SARS-CoV-2 RNA is generally detectable in upper respiratory specimens during the acute phase of infection. The lowest concentration of SARS-CoV-2 viral copies this assay can detect is 138 copies/mL. A negative result does not preclude SARS-Cov-2 infection and should not be used as the sole basis for treatment or other patient management decisions. A negative result may occur with  improper specimen collection/handling, submission of specimen other than nasopharyngeal swab, presence of viral mutation(s) within the areas targeted by this assay, and inadequate number of viral copies(<138 copies/mL). A negative result must be combined with clinical observations, patient history, and epidemiological information. The expected result is Negative.  Fact Sheet for Patients:  BloggerCourse.com  Fact Sheet for Healthcare Providers:  SeriousBroker.it  This test is no t yet approved or cleared by the [place]  FDA and  has been authorized for detection and/or diagnosis of SARS-CoV-2 by FDA under an Emergency Use Authorization (EUA). This EUA will remain  in effect (meaning this test can be used) for the duration of the COVID-19 declaration under Section 564(b)(1) of the Act, 21 U.S.C.section 360bbb-3(b)(1), unless the authorization is terminated  or revoked sooner.       Influenza A by PCR NEGATIVE NEGATIVE Final   Influenza B by PCR NEGATIVE NEGATIVE Final    Comment: (NOTE) The  Xpert Xpress SARS-CoV-2/FLU/RSV plus assay is intended as an aid in the diagnosis of influenza from Nasopharyngeal swab specimens and should not be used as a sole basis for treatment. Nasal washings and aspirates are unacceptable for Xpert Xpress SARS-CoV-2/FLU/RSV testing.  Fact Sheet for Patients: BloggerCourse.com  Fact Sheet for Healthcare Providers: SeriousBroker.it  This test is not yet approved or cleared by the [place]  FDA and has been authorized for detection and/or diagnosis of SARS-CoV-2 by FDA under an Emergency Use Authorization (EUA). This EUA will remain in effect (meaning this test can be used) for the duration of the COVID-19 declaration under Section 564(b)(1) of the Act, 21 U.S.C. section 360bbb-3(b)(1), unless the authorization is terminated or revoked.     Resp Syncytial Virus by PCR NEGATIVE NEGATIVE Final    Comment: (NOTE) Fact Sheet for Patients: BloggerCourse.com  Fact Sheet for Healthcare Providers: SeriousBroker.it  This test is not yet approved or cleared by the [place]  FDA and has been authorized for detection and/or diagnosis of SARS-CoV-2 by FDA under an Emergency Use Authorization (EUA). This EUA will remain in effect (meaning this test can be used) for the duration of the COVID-19 declaration under Section 564(b)(1) of the Act, 21 U.S.C. section 360bbb-3(b)(1), unless the authorization is terminated or revoked.  Performed at [facility], [address], [place], [place] [address]  Radiology Studies: No results found.      Scheduled Meds:  atovaquone   1,500 mg Oral Q breakfast   bictegravir-emtricitabine -tenofovir  AF  1 tablet Oral Daily   DULoxetine   30 mg Oral QHS   enoxaparin  (LOVENOX ) injection  40 mg Subcutaneous QHS   folic acid   1 mg Oral Daily   hydrocerin   Topical BID   lidocaine   1 patch  Transdermal Q24H   lipase/protease/amylase  24,000 Units Oral TID AC   multivitamin with minerals  1 tablet Oral QHS   pantoprazole   40 mg Oral BID   senna-docusate  2 tablet Oral BID   thiamine   100 mg Oral Daily   Continuous Infusions:   LOS: 111 days       Alphonsus Jeans, MD [facility] Pager [phone]  If 7PM-7AM, please contact night-coverage [email or web address] 03/28/2024, 8:30 AM

## 2024-03-29 DIAGNOSIS — F02818 Dementia in other diseases classified elsewhere, unspecified severity, with other behavioral disturbance: Secondary | ICD-10-CM | POA: Diagnosis not present

## 2024-03-29 DIAGNOSIS — B20 Human immunodeficiency virus [HIV] disease: Secondary | ICD-10-CM | POA: Diagnosis not present

## 2024-03-29 NOTE — Plan of Care (Signed)
 Problem: Coping: Goal: Ability to adjust to condition or change in health will improve 03/29/2024 2250 by Rayleen Cal, RN Outcome: Progressing 03/29/2024 2249 by Rayleen Cal, RN Outcome: Progressing   Problem: Fluid Volume: Goal: Ability to maintain a balanced intake and output will improve 03/29/2024 2250 by Rayleen Cal, RN Outcome: Progressing 03/29/2024 2249 by Rayleen Cal, RN Outcome: Progressing   Problem: Health Behavior/Discharge Planning: Goal: Ability to identify and utilize available resources and services will improve 03/29/2024 2250 by Rayleen Cal, RN Outcome: Progressing 03/29/2024 2249 by Rayleen Cal, RN Outcome: Progressing Goal: Ability to manage health-related needs will improve 03/29/2024 2250 by Rayleen Cal, RN Outcome: Progressing 03/29/2024 2249 by Rayleen Cal, RN Outcome: Progressing   Problem: Metabolic: Goal: Ability to maintain appropriate glucose levels will improve 03/29/2024 2250 by Rayleen Cal, RN Outcome: Progressing 03/29/2024 2249 by Rayleen Cal, RN Outcome: Progressing   Problem: Nutritional: Goal: Maintenance of adequate nutrition will improve 03/29/2024 2250 by Rayleen Cal, RN Outcome: Progressing 03/29/2024 2249 by Rayleen Cal, RN Outcome: Progressing Goal: Progress toward achieving an optimal weight will improve 03/29/2024 2250 by Rayleen Cal, RN Outcome: Progressing 03/29/2024 2249 by Rayleen Cal, RN Outcome: Progressing   Problem: Skin Integrity: Goal: Risk for impaired skin integrity will decrease 03/29/2024 2250 by Rayleen Cal, RN Outcome: Progressing 03/29/2024 2249 by Rayleen Cal, RN Outcome: Progressing   Problem: Tissue Perfusion: Goal: Adequacy of tissue perfusion will improve 03/29/2024 2250 by Rayleen Cal, RN Outcome: Progressing 03/29/2024 2249 by Rayleen Cal, RN Outcome: Progressing   Problem: Education: Goal:  Knowledge of General Education information will improve Description: Including pain rating scale, medication(s)/side effects and non-pharmacologic comfort measures 03/29/2024 2250 by Rayleen Cal, RN Outcome: Progressing 03/29/2024 2249 by Rayleen Cal, RN Outcome: Progressing   Problem: Health Behavior/Discharge Planning: Goal: Ability to manage health-related needs will improve 03/29/2024 2250 by Rayleen Cal, RN Outcome: Progressing 03/29/2024 2249 by Rayleen Cal, RN Outcome: Progressing   Problem: Clinical Measurements: Goal: Ability to maintain clinical measurements within normal limits will improve 03/29/2024 2250 by Rayleen Cal, RN Outcome: Progressing 03/29/2024 2249 by Rayleen Cal, RN Outcome: Progressing Goal: Will remain free from infection 03/29/2024 2250 by Rayleen Cal, RN Outcome: Progressing 03/29/2024 2249 by Rayleen Cal, RN Outcome: Progressing Goal: Diagnostic test results will improve 03/29/2024 2250 by Rayleen Cal, RN Outcome: Progressing 03/29/2024 2249 by Rayleen Cal, RN Outcome: Progressing Goal: Respiratory complications will improve 03/29/2024 2250 by Rayleen Cal, RN Outcome: Progressing 03/29/2024 2249 by Rayleen Cal, RN Outcome: Progressing Goal: Cardiovascular complication will be avoided 03/29/2024 2250 by Rayleen Cal, RN Outcome: Progressing 03/29/2024 2249 by Rayleen Cal, RN Outcome: Progressing   Problem: Activity: Goal: Risk for activity intolerance will decrease 03/29/2024 2250 by Rayleen Cal, RN Outcome: Progressing 03/29/2024 2249 by Rayleen Cal, RN Outcome: Progressing   Problem: Nutrition: Goal: Adequate nutrition will be maintained 03/29/2024 2250 by Rayleen Cal, RN Outcome: Progressing 03/29/2024 2249 by Rayleen Cal, RN Outcome: Progressing   Problem: Coping: Goal: Level of anxiety will decrease 03/29/2024 2250 by Rayleen Cal,  RN Outcome: Progressing 03/29/2024 2249 by Rayleen Cal, RN Outcome: Progressing   Problem: Elimination: Goal: Will not experience complications related to bowel motility 03/29/2024 2250 by Rayleen Cal, RN Outcome: Progressing 03/29/2024 2249 by Rayleen Cal, RN Outcome: Progressing Goal: Will not experience  complications related to urinary retention 03/29/2024 2250 by Rayleen Cal, RN Outcome: Progressing 03/29/2024 2249 by Rayleen Cal, RN Outcome: Progressing   Problem: Pain Management: Goal: General experience of comfort will improve 03/29/2024 2250 by Rayleen Cal, RN Outcome: Progressing 03/29/2024 2249 by Rayleen Cal, RN Outcome: Progressing   Problem: Safety: Goal: Ability to remain free from injury will improve 03/29/2024 2250 by Rayleen Cal, RN Outcome: Progressing 03/29/2024 2249 by Rayleen Cal, RN Outcome: Progressing   Problem: Skin Integrity: Goal: Risk for impaired skin integrity will decrease 03/29/2024 2250 by Rayleen Cal, RN Outcome: Progressing 03/29/2024 2249 by Rayleen Cal, RN Outcome: Progressing   Problem: Activity: Goal: Ability to tolerate increased activity will improve 03/29/2024 2250 by Rayleen Cal, RN Outcome: Progressing 03/29/2024 2249 by Rayleen Cal, RN Outcome: Progressing   Problem: Respiratory: Goal: Ability to maintain a clear airway and adequate ventilation will improve 03/29/2024 2250 by Rayleen Cal, RN Outcome: Progressing 03/29/2024 2249 by Rayleen Cal, RN Outcome: Progressing

## 2024-03-29 NOTE — Plan of Care (Signed)
  Problem: Coping: Goal: Ability to adjust to condition or change in health will improve Outcome: Progressing   Problem: Fluid Volume: Goal: Ability to maintain a balanced intake and output will improve Outcome: Progressing   Problem: Health Behavior/Discharge Planning: Goal: Ability to identify and utilize available resources and services will improve Outcome: Progressing Goal: Ability to manage health-related needs will improve Outcome: Progressing   Problem: Metabolic: Goal: Ability to maintain appropriate glucose levels will improve Outcome: Progressing   Problem: Nutritional: Goal: Maintenance of adequate nutrition will improve Outcome: Progressing Goal: Progress toward achieving an optimal weight will improve Outcome: Progressing   Problem: Skin Integrity: Goal: Risk for impaired skin integrity will decrease Outcome: Progressing   Problem: Tissue Perfusion: Goal: Adequacy of tissue perfusion will improve Outcome: Progressing   Problem: Education: Goal: Knowledge of General Education information will improve Description: Including pain rating scale, medication(s)/side effects and non-pharmacologic comfort measures Outcome: Progressing   Problem: Health Behavior/Discharge Planning: Goal: Ability to manage health-related needs will improve Outcome: Progressing   Problem: Clinical Measurements: Goal: Ability to maintain clinical measurements within normal limits will improve Outcome: Progressing Goal: Will remain free from infection Outcome: Progressing Goal: Diagnostic test results will improve Outcome: Progressing Goal: Respiratory complications will improve Outcome: Progressing Goal: Cardiovascular complication will be avoided Outcome: Progressing   Problem: Activity: Goal: Risk for activity intolerance will decrease Outcome: Progressing   Problem: Nutrition: Goal: Adequate nutrition will be maintained Outcome: Progressing   Problem: Coping: Goal:  Level of anxiety will decrease Outcome: Progressing   Problem: Elimination: Goal: Will not experience complications related to bowel motility Outcome: Progressing Goal: Will not experience complications related to urinary retention Outcome: Progressing   Problem: Pain Management: Goal: General experience of comfort will improve Outcome: Progressing   Problem: Safety: Goal: Ability to remain free from injury will improve Outcome: Progressing   Problem: Skin Integrity: Goal: Risk for impaired skin integrity will decrease Outcome: Progressing   Problem: Activity: Goal: Ability to tolerate increased activity will improve Outcome: Progressing   Problem: Respiratory: Goal: Ability to maintain a clear airway and adequate ventilation will improve Outcome: Progressing

## 2024-03-29 NOTE — Progress Notes (Signed)
 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 Poor historian and does not have capacity to make decisions as per psych. Continue on biktarvy . Continue on atovaquone  for PCP ppx   HIV associated neurocognitive disorder: MRI did not show any enhancing lesions but did show severe cerebral atrophy likely secondary to HIV/AIDS.    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 Continue w/ conservative management as per ortho surg   Diarrhea: Resolved    Hypotension: resolved. Holding midodrine     Social issues: homeless and has polysubstance use disorder (alcohol, cocaine).  Pt's boyfriend/fiance' is not able to visit the pt anymore b/c concern of him bringing the pt alcohol and/or illicit drugs. Awaiting guardianship still. Unsafe d/c plan     DVT prophylaxis: lovenox  Code Status: full  Family Communication:  Disposition Plan: awaiting guardianship still. Unsafe d/c plan   Level of care: Med-Surg Consultants:  Psych  Ortho surg    Procedures:  Antimicrobials:   Subjective: Pt stating she wants to go home.   Objective: Vitals:   03/28/24 1517 03/28/24 2119 03/29/24 0424 03/29/24 0426  BP: 122/82 (!) 114/93  116/76  Pulse: (!) 101 97  93  Resp: 16 16  16   Temp: 98.6 F (37 C) 98.3 F (36.8 C)  98.1 F (36.7 C)  TempSrc:      SpO2: 98% 100%  99%  Weight:   84.4 kg   Height:        Intake/Output Summary (Last 24 hours) at 03/29/2024 0829 Last data filed at 03/28/2024 1900 Gross per 24 hour  Intake  480 ml  Output --  Net 480 ml   Filed Weights   03/25/24 0500 03/28/24 0500 03/29/24 0424  Weight: 85.5 kg 86.8 kg 84.4 kg    Examination:  General exam: Appears frustrated  Respiratory system: clear breath sounds b/l  Cardiovascular system: S1/S2+. No rubs or clicks  Gastrointestinal system: abd is soft, NT, ND & hypoactive bowel sounds  Central nervous system: alert & awake. Moves all extremities  Psychiatry: judgement and insight appears poor. Frustrated mood and affect    Data Reviewed: I have personally reviewed following labs and imaging studies  CBC: No results for input(s): "WBC", "NEUTROABS", "HGB", "HCT", "MCV", "PLT" in the last 168 hours. Basic Metabolic Panel: No results for input(s): "NA", "K", "CL", "CO2", "GLUCOSE", "BUN", "CREATININE", "CALCIUM ", "MG", "PHOS" in the last 168 hours. GFR: Estimated Creatinine Clearance: 96.8 mL/min (by C-G formula based on SCr of 0.66 mg/dL). Liver Function Tests: No results for input(s): "AST", "ALT", "ALKPHOS", "BILITOT", "PROT", "ALBUMIN" in the last 168 hours. No results for input(s): "LIPASE", "AMYLASE" in the last 168 hours. No results for input(s): "AMMONIA" in the last 168 hours. Coagulation Profile: No results for input(s): "INR", "PROTIME" in the last 168 hours. Cardiac Enzymes: No results for input(s): "CKTOTAL", "CKMB", "CKMBINDEX", "TROPONINI" in the last 168 hours. BNP (last 3 results) No results for input(s): "PROBNP" in the last 8760 hours. HbA1C: No results for input(s): "HGBA1C" in the last 72 hours. CBG: No results for  input(s): "GLUCAP" in the last 168 hours. Lipid Profile: No results for input(s): "CHOL", "HDL", "LDLCALC", "TRIG", "CHOLHDL", "LDLDIRECT" in the last 72 hours. Thyroid Function Tests: No results for input(s): "TSH", "T4TOTAL", "FREET4", "T3FREE", "THYROIDAB" in the last 72 hours. Anemia Panel: No results for input(s): "VITAMINB12", "FOLATE", "FERRITIN", "TIBC", "IRON", "RETICCTPCT" in  the last 72 hours. Sepsis Labs: No results for input(s): "PROCALCITON", "LATICACIDVEN" in the last 168 hours.  Recent Results (from the past 240 hours)  Resp panel by RT-PCR (RSV, Flu A&B, Covid) Anterior Nasal Swab     Status: None   Collection Time: 03/25/24  1:55 PM   Specimen: Anterior Nasal Swab  Result Value Ref Range Status   SARS Coronavirus 2 by RT PCR NEGATIVE NEGATIVE Final    Comment: (NOTE) SARS-CoV-2 target nucleic acids are NOT DETECTED.  The SARS-CoV-2 RNA is generally detectable in upper respiratory specimens during the acute phase of infection. The lowest concentration of SARS-CoV-2 viral copies this assay can detect is 138 copies/mL. A negative result does not preclude SARS-Cov-2 infection and should not be used as the sole basis for treatment or other patient management decisions. A negative result may occur with  improper specimen collection/handling, submission of specimen other than nasopharyngeal swab, presence of viral mutation(s) within the areas targeted by this assay, and inadequate number of viral copies(<138 copies/mL). A negative result must be combined with clinical observations, patient history, and epidemiological information. The expected result is Negative.  Fact Sheet for Patients:  BloggerCourse.com  Fact Sheet for Healthcare Providers:  SeriousBroker.it  This test is no t yet approved or cleared by the [place]  FDA and  has been authorized for detection and/or diagnosis of SARS-CoV-2 by FDA under an Emergency Use Authorization (EUA). This EUA will remain  in effect (meaning this test can be used) for the duration of the COVID-19 declaration under Section 564(b)(1) of the Act, 21 U.S.C.section 360bbb-3(b)(1), unless the authorization is terminated  or revoked sooner.       Influenza A by PCR NEGATIVE NEGATIVE Final   Influenza B by PCR NEGATIVE NEGATIVE Final    Comment: (NOTE) The  Xpert Xpress SARS-CoV-2/FLU/RSV plus assay is intended as an aid in the diagnosis of influenza from Nasopharyngeal swab specimens and should not be used as a sole basis for treatment. Nasal washings and aspirates are unacceptable for Xpert Xpress SARS-CoV-2/FLU/RSV testing.  Fact Sheet for Patients: BloggerCourse.com  Fact Sheet for Healthcare Providers: SeriousBroker.it  This test is not yet approved or cleared by the [place]  FDA and has been authorized for detection and/or diagnosis of SARS-CoV-2 by FDA under an Emergency Use Authorization (EUA). This EUA will remain in effect (meaning this test can be used) for the duration of the COVID-19 declaration under Section 564(b)(1) of the Act, 21 U.S.C. section 360bbb-3(b)(1), unless the authorization is terminated or revoked.     Resp Syncytial Virus by PCR NEGATIVE NEGATIVE Final    Comment: (NOTE) Fact Sheet for Patients: BloggerCourse.com  Fact Sheet for Healthcare Providers: SeriousBroker.it  This test is not yet approved or cleared by the [place]  FDA and has been authorized for detection and/or diagnosis of SARS-CoV-2 by FDA under an Emergency Use Authorization (EUA). This EUA will remain in effect (meaning this test can be used) for the duration of the COVID-19 declaration under Section 564(b)(1) of the Act, 21 U.S.C. section 360bbb-3(b)(1), unless the authorization is terminated or revoked.  Performed at [facility], [address] [place], [place] [address]  Radiology Studies: No results found.      Scheduled Meds:  atovaquone   1,500 mg Oral Q breakfast   bictegravir-emtricitabine -tenofovir  AF  1 tablet Oral Daily   DULoxetine   30 mg Oral QHS   enoxaparin  (LOVENOX ) injection  40 mg Subcutaneous QHS   folic acid   1 mg Oral Daily   hydrocerin   Topical BID   lidocaine   1 patch  Transdermal Q24H   lipase/protease/amylase  24,000 Units Oral TID AC   multivitamin with minerals  1 tablet Oral QHS   pantoprazole   40 mg Oral BID   senna-docusate  2 tablet Oral BID   thiamine   100 mg Oral Daily   Continuous Infusions:   LOS: 112 days       Alphonsus Jeans, MD [facility] Pager [phone]  If 7PM-7AM, please contact night-coverage [email or web address] 03/29/2024, 8:29 AM

## 2024-03-30 DIAGNOSIS — B20 Human immunodeficiency virus [HIV] disease: Secondary | ICD-10-CM | POA: Diagnosis not present

## 2024-03-30 DIAGNOSIS — F02818 Dementia in other diseases classified elsewhere, unspecified severity, with other behavioral disturbance: Secondary | ICD-10-CM | POA: Diagnosis not present

## 2024-03-30 NOTE — Plan of Care (Signed)
   Problem: Coping: Goal: Ability to adjust to condition or change in health will improve Outcome: Progressing   Problem: Fluid Volume: Goal: Ability to maintain a balanced intake and output will improve Outcome: Progressing

## 2024-03-30 NOTE — Progress Notes (Signed)
 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 Poor historian and does not have capacity to make decisions as per psych. Continue on biktarvy . Continue on atovaquone  for PCP ppx   HIV associated neurocognitive disorder: MRI did not show any enhancing lesions but did show severe cerebral atrophy likely secondary to HIV/AIDS.    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  Continue w/ conservative management as per ortho surg   Diarrhea: Resolved    Hypotension: resolved. Holding midodrine      Social issues: homeless and has polysubstance use disorder (alcohol, cocaine).  Pt's boyfriend/fiance' is not able to visit the pt anymore b/c concern of him bringing the pt alcohol and/or illicit drugs. Awaiting guardianship still. Unsafe d/c plan      DVT prophylaxis: lovenox  Code Status: full  Family Communication:  Disposition Plan: awaiting guardianship still. Unsafe d/c plan   Level of care: Med-Surg Consultants:  Psych  Ortho surg    Procedures:  Antimicrobials:   Subjective: Pt states daily that she wants to go home.   Objective: Vitals:   03/29/24 0901 03/29/24 1617 03/29/24 2029 03/30/24 0245  BP: 138/84 102/65 109/73 (!) 89/54  Pulse: (!) 102 (!) 104 (!) 108 94  Resp: 18 17 18 16   Temp: 98.8 F (37.1 C) 98.2 F (36.8 C) 98.9 F (37.2 C) 98.4 F (36.9 C)  TempSrc:  Oral    SpO2: 100% 99% 99% 99%  Weight:    86.1 kg  Height:        Intake/Output Summary (Last 24 hours) at 03/30/2024 0813 Last  data filed at 03/30/2024 0320 Gross per 24 hour  Intake 840 ml  Output --  Net 840 ml   Filed Weights   03/28/24 0500 03/29/24 0424 03/30/24 0245  Weight: 86.8 kg 84.4 kg 86.1 kg    Examination:  General exam: Appears frustrated  Respiratory system: clear breath sounds b/l Cardiovascular system: S1 & S2+. No rubs or clicks  Gastrointestinal system: abd is soft, NT, ND, normal bowel sounds  Central nervous system: alert & awake. Moves all extremities  Psychiatry: judgement and insight appears poor. Frustrated mood and affect    Data Reviewed: I have personally reviewed following labs and imaging studies  CBC: No results for input(s): "WBC", "NEUTROABS", "HGB", "HCT", "MCV", "PLT" in the last 168 hours. Basic Metabolic Panel: No results for input(s): "NA", "K", "CL", "CO2", "GLUCOSE", "BUN", "CREATININE", "CALCIUM ", "MG", "PHOS" in the last 168 hours. GFR: Estimated Creatinine Clearance: 97.8 mL/min (by C-G formula based on SCr of 0.66 mg/dL). Liver Function Tests: No results for input(s): "AST", "ALT", "ALKPHOS", "BILITOT", "PROT", "ALBUMIN" in the last 168 hours. No results for input(s): "LIPASE", "AMYLASE" in the last 168 hours. No results for input(s): "AMMONIA" in the last 168 hours. Coagulation Profile: No results for input(s): "INR", "PROTIME" in the last 168 hours. Cardiac Enzymes: No results for input(s): "CKTOTAL", "CKMB", "CKMBINDEX", "TROPONINI" in the last 168 hours. BNP (last 3 results) No results for input(s): "PROBNP" in the last 8760 hours. HbA1C: No results for input(s): "  HGBA1C" in the last 72 hours. CBG: No results for input(s): "GLUCAP" in the last 168 hours. Lipid Profile: No results for input(s): "CHOL", "HDL", "LDLCALC", "TRIG", "CHOLHDL", "LDLDIRECT" in the last 72 hours. Thyroid Function Tests: No results for input(s): "TSH", "T4TOTAL", "FREET4", "T3FREE", "THYROIDAB" in the last 72 hours. Anemia Panel: No results for input(s): "VITAMINB12",  "FOLATE", "FERRITIN", "TIBC", "IRON", "RETICCTPCT" in the last 72 hours. Sepsis Labs: No results for input(s): "PROCALCITON", "LATICACIDVEN" in the last 168 hours.  Recent Results (from the past 240 hours)  Resp panel by RT-PCR (RSV, Flu A&B, Covid) Anterior Nasal Swab     Status: None   Collection Time: 03/25/24  1:55 PM   Specimen: Anterior Nasal Swab  Result Value Ref Range Status   SARS Coronavirus 2 by RT PCR NEGATIVE NEGATIVE Final    Comment: (NOTE) SARS-CoV-2 target nucleic acids are NOT DETECTED.  The SARS-CoV-2 RNA is generally detectable in upper respiratory specimens during the acute phase of infection. The lowest concentration of SARS-CoV-2 viral copies this assay can detect is 138 copies/mL. A negative result does not preclude SARS-Cov-2 infection and should not be used as the sole basis for treatment or other patient management decisions. A negative result may occur with  improper specimen collection/handling, submission of specimen other than nasopharyngeal swab, presence of viral mutation(s) within the areas targeted by this assay, and inadequate number of viral copies(<138 copies/mL). A negative result must be combined with clinical observations, patient history, and epidemiological information. The expected result is Negative.  Fact Sheet for Patients:  BloggerCourse.com  Fact Sheet for Healthcare Providers:  SeriousBroker.it  This test is no t yet approved or cleared by the [place]  FDA and  has been authorized for detection and/or diagnosis of SARS-CoV-2 by FDA under an Emergency Use Authorization (EUA). This EUA will remain  in effect (meaning this test can be used) for the duration of the COVID-19 declaration under Section 564(b)(1) of the Act, 21 U.S.C.section 360bbb-3(b)(1), unless the authorization is terminated  or revoked sooner.       Influenza A by PCR NEGATIVE NEGATIVE Final   Influenza B  by PCR NEGATIVE NEGATIVE Final    Comment: (NOTE) The Xpert Xpress SARS-CoV-2/FLU/RSV plus assay is intended as an aid in the diagnosis of influenza from Nasopharyngeal swab specimens and should not be used as a sole basis for treatment. Nasal washings and aspirates are unacceptable for Xpert Xpress SARS-CoV-2/FLU/RSV testing.  Fact Sheet for Patients: BloggerCourse.com  Fact Sheet for Healthcare Providers: SeriousBroker.it  This test is not yet approved or cleared by the [place]  FDA and has been authorized for detection and/or diagnosis of SARS-CoV-2 by FDA under an Emergency Use Authorization (EUA). This EUA will remain in effect (meaning this test can be used) for the duration of the COVID-19 declaration under Section 564(b)(1) of the Act, 21 U.S.C. section 360bbb-3(b)(1), unless the authorization is terminated or revoked.     Resp Syncytial Virus by PCR NEGATIVE NEGATIVE Final    Comment: (NOTE) Fact Sheet for Patients: BloggerCourse.com  Fact Sheet for Healthcare Providers: SeriousBroker.it  This test is not yet approved or cleared by the [place]  FDA and has been authorized for detection and/or diagnosis of SARS-CoV-2 by FDA under an Emergency Use Authorization (EUA). This EUA will remain in effect (meaning this test can be used) for the duration of the COVID-19 declaration under Section 564(b)(1) of the Act, 21 U.S.C. section 360bbb-3(b)(1), unless the authorization is terminated or revoked.  Performed at [facility]  Lab, [address], [place], [place] [address]          Radiology Studies: No results found.      Scheduled Meds:  atovaquone   1,500 mg Oral Q breakfast   bictegravir-emtricitabine -tenofovir  AF  1 tablet Oral Daily   DULoxetine   30 mg Oral QHS   enoxaparin  (LOVENOX ) injection  40 mg Subcutaneous QHS   folic acid   1 mg Oral  Daily   hydrocerin   Topical BID   lidocaine   1 patch Transdermal Q24H   lipase/protease/amylase  24,000 Units Oral TID AC   multivitamin with minerals  1 tablet Oral QHS   pantoprazole   40 mg Oral BID   senna-docusate  2 tablet Oral BID   thiamine   100 mg Oral Daily   Continuous Infusions:   LOS: 113 days       Alphonsus Jeans, MD [facility] Pager [phone]  If 7PM-7AM, please contact night-coverage [email or web address] 03/30/2024, 8:13 AM

## 2024-03-31 DIAGNOSIS — B20 Human immunodeficiency virus [HIV] disease: Secondary | ICD-10-CM | POA: Diagnosis not present

## 2024-03-31 DIAGNOSIS — F02818 Dementia in other diseases classified elsewhere, unspecified severity, with other behavioral disturbance: Secondary | ICD-10-CM | POA: Diagnosis not present

## 2024-03-31 LAB — ACID FAST CULTURE WITH REFLEXED SENSITIVITIES (MYCOBACTERIA)
Acid Fast Culture: NEGATIVE
Acid Fast Culture: NEGATIVE

## 2024-03-31 NOTE — Progress Notes (Signed)
 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 Poor historian and does not have capacity to make decisions as per psych. Continue on biktarvy . Continue on atovaquone  for PCP ppx   HIV associated neurocognitive disorder: MRI did not show any enhancing lesions but did show severe cerebral atrophy likely secondary to HIV/AIDS.    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 Continue w/ conservative management   Diarrhea: Resolved    Hypotension: resolved. Holding midodrine     Social issues: homeless and has polysubstance use disorder (alcohol, cocaine).  Pt's boyfriend/fiance' is not able to visit the pt anymore b/c concern of him bringing the pt alcohol and/or illicit drugs. Awaiting guardianship still. Unsafe d/c plan      DVT prophylaxis: lovenox  Code Status: full  Family Communication:  Disposition Plan: awaiting guardianship still. Unsafe d/c plan   Level of care: Med-Surg Consultants:  Psych  Ortho surg    Procedures:  Antimicrobials:   Subjective: Pt c/o fatigue and wanting to go home.   Objective: Vitals:   03/31/24 0401 03/31/24 0500 03/31/24 0712 03/31/24 0806  BP: 116/70  103/87 108/86  Pulse: 90  88 96  Resp: 20  20 16   Temp: 98 F (36.7 C)  98 F (36.7 C) 98.3 F (36.8 C)  TempSrc: Oral     SpO2: 98%  99% 99%  Weight:  85.2 kg    Height:        Intake/Output Summary (Last 24 hours) at 03/31/2024 8469 Last data filed at 03/30/2024 1902 Gross per 24 hour  Intake 960 ml  Output --   Net 960 ml   Filed Weights   03/29/24 0424 03/30/24 0245 03/31/24 0500  Weight: 84.4 kg 86.1 kg 85.2 kg    Examination:  General exam: Appears comfortable  Respiratory system: clear breath sounds b/l  Cardiovascular system: S1/S2+. No rubs or gallops  Gastrointestinal system: abd is soft, NT, ND & hypoactive bowel sounds  Central nervous system: alert & awake. Moves all extremities  Psychiatry: judgement and insight appears poor. Flat mood and affect     Data Reviewed: I have personally reviewed following labs and imaging studies  CBC: No results for input(s): "WBC", "NEUTROABS", "HGB", "HCT", "MCV", "PLT" in the last 168 hours. Basic Metabolic Panel: No results for input(s): "NA", "K", "CL", "CO2", "GLUCOSE", "BUN", "CREATININE", "CALCIUM ", "MG", "PHOS" in the last 168 hours. GFR: Estimated Creatinine Clearance: 97.2 mL/min (by C-G formula based on SCr of 0.66 mg/dL). Liver Function Tests: No results for input(s): "AST", "ALT", "ALKPHOS", "BILITOT", "PROT", "ALBUMIN" in the last 168 hours. No results for input(s): "LIPASE", "AMYLASE" in the last 168 hours. No results for input(s): "AMMONIA" in the last 168 hours. Coagulation Profile: No results for input(s): "INR", "PROTIME" in the last 168 hours. Cardiac Enzymes: No results for input(s): "CKTOTAL", "CKMB", "CKMBINDEX", "TROPONINI" in the last 168 hours. BNP (last 3 results) No results for input(s): "PROBNP" in the last 8760 hours. HbA1C: No results for input(s): "HGBA1C" in the last 72 hours. CBG: No results for input(s): "GLUCAP" in  the last 168 hours. Lipid Profile: No results for input(s): "CHOL", "HDL", "LDLCALC", "TRIG", "CHOLHDL", "LDLDIRECT" in the last 72 hours. Thyroid Function Tests: No results for input(s): "TSH", "T4TOTAL", "FREET4", "T3FREE", "THYROIDAB" in the last 72 hours. Anemia Panel: No results for input(s): "VITAMINB12", "FOLATE", "FERRITIN", "TIBC", "IRON", "RETICCTPCT" in the last 72  hours. Sepsis Labs: No results for input(s): "PROCALCITON", "LATICACIDVEN" in the last 168 hours.  Recent Results (from the past 240 hours)  Resp panel by RT-PCR (RSV, Flu A&B, Covid) Anterior Nasal Swab     Status: None   Collection Time: 03/25/24  1:55 PM   Specimen: Anterior Nasal Swab  Result Value Ref Range Status   SARS Coronavirus 2 by RT PCR NEGATIVE NEGATIVE Final    Comment: (NOTE) SARS-CoV-2 target nucleic acids are NOT DETECTED.  The SARS-CoV-2 RNA is generally detectable in upper respiratory specimens during the acute phase of infection. The lowest concentration of SARS-CoV-2 viral copies this assay can detect is 138 copies/mL. A negative result does not preclude SARS-Cov-2 infection and should not be used as the sole basis for treatment or other patient management decisions. A negative result may occur with  improper specimen collection/handling, submission of specimen other than nasopharyngeal swab, presence of viral mutation(s) within the areas targeted by this assay, and inadequate number of viral copies(<138 copies/mL). A negative result must be combined with clinical observations, patient history, and epidemiological information. The expected result is Negative.  Fact Sheet for Patients:  BloggerCourse.com  Fact Sheet for Healthcare Providers:  SeriousBroker.it  This test is no t yet approved or cleared by the [place]  FDA and  has been authorized for detection and/or diagnosis of SARS-CoV-2 by FDA under an Emergency Use Authorization (EUA). This EUA will remain  in effect (meaning this test can be used) for the duration of the COVID-19 declaration under Section 564(b)(1) of the Act, 21 U.S.C.section 360bbb-3(b)(1), unless the authorization is terminated  or revoked sooner.       Influenza A by PCR NEGATIVE NEGATIVE Final   Influenza B by PCR NEGATIVE NEGATIVE Final    Comment: (NOTE) The Xpert  Xpress SARS-CoV-2/FLU/RSV plus assay is intended as an aid in the diagnosis of influenza from Nasopharyngeal swab specimens and should not be used as a sole basis for treatment. Nasal washings and aspirates are unacceptable for Xpert Xpress SARS-CoV-2/FLU/RSV testing.  Fact Sheet for Patients: BloggerCourse.com  Fact Sheet for Healthcare Providers: SeriousBroker.it  This test is not yet approved or cleared by the [place]  FDA and has been authorized for detection and/or diagnosis of SARS-CoV-2 by FDA under an Emergency Use Authorization (EUA). This EUA will remain in effect (meaning this test can be used) for the duration of the COVID-19 declaration under Section 564(b)(1) of the Act, 21 U.S.C. section 360bbb-3(b)(1), unless the authorization is terminated or revoked.     Resp Syncytial Virus by PCR NEGATIVE NEGATIVE Final    Comment: (NOTE) Fact Sheet for Patients: BloggerCourse.com  Fact Sheet for Healthcare Providers: SeriousBroker.it  This test is not yet approved or cleared by the [place]  FDA and has been authorized for detection and/or diagnosis of SARS-CoV-2 by FDA under an Emergency Use Authorization (EUA). This EUA will remain in effect (meaning this test can be used) for the duration of the COVID-19 declaration under Section 564(b)(1) of the Act, 21 U.S.C. section 360bbb-3(b)(1), unless the authorization is terminated or revoked.  Performed at [facility], [address], [place], [place] [address]  Radiology Studies: No results found.      Scheduled Meds:  atovaquone   1,500 mg Oral Q breakfast   bictegravir-emtricitabine -tenofovir  AF  1 tablet Oral Daily   DULoxetine   30 mg Oral QHS   enoxaparin  (LOVENOX ) injection  40 mg Subcutaneous QHS   folic acid   1 mg Oral Daily   hydrocerin   Topical BID   lidocaine   1 patch  Transdermal Q24H   lipase/protease/amylase  24,000 Units Oral TID AC   multivitamin with minerals  1 tablet Oral QHS   pantoprazole   40 mg Oral BID   senna-docusate  2 tablet Oral BID   thiamine   100 mg Oral Daily   Continuous Infusions:   LOS: 114 days       Alphonsus Jeans, MD [facility] Pager [phone]  If 7PM-7AM, please contact night-coverage [email or web address] 03/31/2024, 8:12 AM

## 2024-03-31 NOTE — Plan of Care (Signed)
  Problem: Coping: Goal: Ability to adjust to condition or change in health will improve Outcome: Progressing   Problem: Fluid Volume: Goal: Ability to maintain a balanced intake and output will improve Outcome: Progressing   Problem: Health Behavior/Discharge Planning: Goal: Ability to identify and utilize available resources and services will improve Outcome: Progressing Goal: Ability to manage health-related needs will improve Outcome: Progressing   Problem: Metabolic: Goal: Ability to maintain appropriate glucose levels will improve Outcome: Progressing   Problem: Nutritional: Goal: Maintenance of adequate nutrition will improve Outcome: Progressing Goal: Progress toward achieving an optimal weight will improve Outcome: Progressing   Problem: Skin Integrity: Goal: Risk for impaired skin integrity will decrease Outcome: Progressing   Problem: Tissue Perfusion: Goal: Adequacy of tissue perfusion will improve Outcome: Progressing   Problem: Education: Goal: Knowledge of General Education information will improve Description: Including pain rating scale, medication(s)/side effects and non-pharmacologic comfort measures Outcome: Progressing   Problem: Health Behavior/Discharge Planning: Goal: Ability to manage health-related needs will improve Outcome: Progressing   Problem: Clinical Measurements: Goal: Ability to maintain clinical measurements within normal limits will improve Outcome: Progressing Goal: Will remain free from infection Outcome: Progressing Goal: Diagnostic test results will improve Outcome: Progressing Goal: Respiratory complications will improve Outcome: Progressing Goal: Cardiovascular complication will be avoided Outcome: Progressing   Problem: Activity: Goal: Risk for activity intolerance will decrease Outcome: Progressing   Problem: Nutrition: Goal: Adequate nutrition will be maintained Outcome: Progressing   Problem: Coping: Goal:  Level of anxiety will decrease Outcome: Progressing   Problem: Elimination: Goal: Will not experience complications related to bowel motility Outcome: Progressing Goal: Will not experience complications related to urinary retention Outcome: Progressing   Problem: Pain Management: Goal: General experience of comfort will improve Outcome: Progressing   Problem: Safety: Goal: Ability to remain free from injury will improve Outcome: Progressing   Problem: Skin Integrity: Goal: Risk for impaired skin integrity will decrease Outcome: Progressing   Problem: Activity: Goal: Ability to tolerate increased activity will improve Outcome: Progressing   Problem: Respiratory: Goal: Ability to maintain a clear airway and adequate ventilation will improve Outcome: Progressing

## 2024-04-01 DIAGNOSIS — B20 Human immunodeficiency virus [HIV] disease: Secondary | ICD-10-CM | POA: Diagnosis not present

## 2024-04-01 DIAGNOSIS — F02818 Dementia in other diseases classified elsewhere, unspecified severity, with other behavioral disturbance: Secondary | ICD-10-CM | POA: Diagnosis not present

## 2024-04-01 DIAGNOSIS — A480 Gas gangrene: Secondary | ICD-10-CM | POA: Diagnosis not present

## 2024-04-01 DIAGNOSIS — F03B3 Unspecified dementia, moderate, with mood disturbance: Secondary | ICD-10-CM

## 2024-04-01 DIAGNOSIS — M60851 Other myositis, right thigh: Secondary | ICD-10-CM | POA: Diagnosis not present

## 2024-04-01 LAB — BASIC METABOLIC PANEL WITH GFR
Anion gap: 7 (ref 5–15)
BUN: 12 mg/dL (ref 6–20)
CO2: 26 mmol/L (ref 22–32)
Calcium: 9.3 mg/dL (ref 8.9–10.3)
Chloride: 107 mmol/L (ref 98–111)
Creatinine, Ser: 0.72 mg/dL (ref 0.44–1.00)
GFR, Estimated: >60 mL/min (ref >60)
Glucose, Bld: 115 mg/dL — ABNORMAL HIGH (ref 70–99)
Potassium: 3.8 mmol/L (ref 3.5–5.1)
Sodium: 140 mmol/L (ref 135–145)

## 2024-04-01 LAB — CBC
HCT: 30.4 % — ABNORMAL LOW (ref 36.0–46.0)
Hemoglobin: 9.7 g/dL — ABNORMAL LOW (ref 12.0–15.0)
MCH: 27 pg (ref 26.0–34.0)
MCHC: 31.9 g/dL (ref 30.0–36.0)
MCV: 84.7 fL (ref 80.0–100.0)
Platelets: 298 10*3/uL (ref 150–400)
RBC: 3.59 MIL/uL — ABNORMAL LOW (ref 3.87–5.11)
RDW: 14.7 % (ref 11.5–15.5)
WBC: 4.6 10*3/uL (ref 4.0–10.5)
nRBC: 0 % (ref 0.0–0.2)

## 2024-04-01 NOTE — Plan of Care (Signed)

## 2024-04-01 NOTE — Plan of Care (Signed)
  Problem: Coping: Goal: Ability to adjust to condition or change in health will improve Outcome: Progressing   Problem: Fluid Volume: Goal: Ability to maintain a balanced intake and output will improve Outcome: Progressing   Problem: Health Behavior/Discharge Planning: Goal: Ability to identify and utilize available resources and services will improve Outcome: Progressing Goal: Ability to manage health-related needs will improve Outcome: Progressing   Problem: Metabolic: Goal: Ability to maintain appropriate glucose levels will improve Outcome: Progressing   Problem: Nutritional: Goal: Maintenance of adequate nutrition will improve Outcome: Progressing Goal: Progress toward achieving an optimal weight will improve Outcome: Progressing   Problem: Skin Integrity: Goal: Risk for impaired skin integrity will decrease Outcome: Progressing   Problem: Tissue Perfusion: Goal: Adequacy of tissue perfusion will improve Outcome: Progressing   Problem: Education: Goal: Knowledge of General Education information will improve Description: Including pain rating scale, medication(s)/side effects and non-pharmacologic comfort measures Outcome: Progressing   Problem: Health Behavior/Discharge Planning: Goal: Ability to manage health-related needs will improve Outcome: Progressing   Problem: Clinical Measurements: Goal: Ability to maintain clinical measurements within normal limits will improve Outcome: Progressing Goal: Will remain free from infection Outcome: Progressing Goal: Diagnostic test results will improve Outcome: Progressing Goal: Respiratory complications will improve Outcome: Progressing Goal: Cardiovascular complication will be avoided Outcome: Progressing   Problem: Activity: Goal: Risk for activity intolerance will decrease Outcome: Progressing   Problem: Nutrition: Goal: Adequate nutrition will be maintained Outcome: Progressing   Problem: Coping: Goal:  Level of anxiety will decrease Outcome: Progressing   Problem: Elimination: Goal: Will not experience complications related to bowel motility Outcome: Progressing Goal: Will not experience complications related to urinary retention Outcome: Progressing   Problem: Pain Management: Goal: General experience of comfort will improve Outcome: Progressing   Problem: Safety: Goal: Ability to remain free from injury will improve Outcome: Progressing   Problem: Skin Integrity: Goal: Risk for impaired skin integrity will decrease Outcome: Progressing   Problem: Activity: Goal: Ability to tolerate increased activity will improve Outcome: Progressing   Problem: Respiratory: Goal: Ability to maintain a clear airway and adequate ventilation will improve Outcome: Progressing

## 2024-04-01 NOTE — Progress Notes (Signed)
 PROGRESS NOTE   HPI was taken from NP [name]: 48 y.o with significant PMH of EtOH abuse ,Cocaine use, Homelessness, Uncontrolled HIV, on BIKTARVY , A-fib, GERD, Hypothyroidism, Diabetes, who presented to the ED with chief complaints of unresponsiveness.   Per ED reports, EMS report that patient was boarding at a friend's house who noticed that she had not woken up the whole day. Patient's friend attempted to wake her up but noticed that she was unresponsive and making "gurgling" sound so they called EMS. On EMS arrival, patient was found to be unconscious and incontinent of urine and feces.     ED Course: Initial vital signs showed HR of 118 beats/minute, BP 90/60 mm Hg, the RR 25 breaths/minute, and the oxygen saturation 98% on NRB and a temperature of 100.74F. Patient was obtunded with no gag reflex with debris around the mouth and nose concerning for stomach contents. She was emergently intubated for airway protection. Pertinent Labs/Diagnostics Findings: Na+/ K+:140/2.6  Glucose: 117 BUN/Cr.:33/1.18 AST/ALT:45/17 WBC:9.6 K/L PCT: 2.2 Lactic acid: 1.1 COVID PCR: Negative,  ABG: pO2 139; pCO2 27; pH 7.51;  HCO3 21.5, %O2 Sat 100.  CXR> see full report  Medication administered in the ED: Patient given 30 cc/kg of fluids and started on broad-spectrum antibiotics Ceftriaxone  and Azithromycin  for suspected sepsis Disposition:ICU   As per Dr. Broadus Canes 4/16-4/22/25: Pt has remained medically stable. Still awaiting guardianship hearing date. 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 Poor historian and does not have capacity to make decisions as per psych. Still waiting on guardianship hearing date. Continue on biktarvy . Continue on atovaquone  for PCP ppx  HIV associated neurocognitive disorder: MRI did not show any enhancing lesions but did show severe cerebral atrophy likely secondary to HIV/AIDS.    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 Continue w/ conservative management   Diarrhea: Resolved    Hypotension: resolved. Holding midodrine     Social issues: homeless and has polysubstance use disorder (alcohol, cocaine).  Pt's boyfriend/fiance' is not able to visit the pt anymore b/c concern of him bringing the pt alcohol and/or illicit drugs. Still awaiting guardianship. No safe d/c plan      DVT prophylaxis: lovenox  Code Status: full  Family Communication:  Disposition Plan: awaiting guardianship still. Unsafe d/c plan   Level of care: Med-Surg Consultants:  Psych  Ortho surg    Procedures:  Antimicrobials:   Subjective: Pt denies any complaints today   Objective: Vitals:   03/31/24 1557 03/31/24 2054 04/01/24 0223 04/01/24 0236  BP: 118/82 111/75 108/77   Pulse: (!) 107 (!) 102 100   Resp: 17 16 16    Temp: 97.8 F (36.6 C) 98.2 F (36.8 C) 97.7 F (36.5 C)   TempSrc:  Oral    SpO2: 99% 100% 98%   Weight:    86 kg  Height:        Intake/Output Summary (Last 24 hours) at 04/01/2024 0815 Last data filed at 03/31/2024 1952 Gross per 24 hour  Intake 480 ml  Output --  Net  480 ml   Filed Weights   03/30/24 0245 03/31/24 0500 04/01/24 0236  Weight: 86.1 kg 85.2 kg 86 kg    Examination:  General exam: Appears calm & comfortable  Respiratory system: clear breath sounds b/l  Cardiovascular system: S1 & S2+. No rubs or gallops   Gastrointestinal system: abd is soft, NT, ND & hypoactive bowel sounds  Central nervous system: alert & awake. Moves all extremities   Psychiatry: judgement and insight appears poor. Flat mood and affect    Data Reviewed: I have personally reviewed following labs and imaging studies  CBC: Recent Labs  Lab 04/01/24 0611  WBC 4.6  HGB 9.7*  HCT 30.4*  MCV 84.7  PLT 298   Basic Metabolic Panel: Recent Labs  Lab 04/01/24 0611  NA 140  K 3.8  CL 107  CO2 26  GLUCOSE 115*  BUN 12  CREATININE 0.72  CALCIUM  9.3   GFR: Estimated Creatinine Clearance: 97.6 mL/min (by C-G formula based on SCr of 0.72 mg/dL). Liver Function Tests: No results for input(s): "AST", "ALT", "ALKPHOS", "BILITOT", "PROT", "ALBUMIN" in the last 168 hours. No results for input(s): "LIPASE", "AMYLASE" in the last 168 hours. No results for input(s): "AMMONIA" in the last 168 hours. Coagulation Profile: No results for input(s): "INR", "PROTIME" in the last 168 hours. Cardiac Enzymes: No results for input(s): "CKTOTAL", "CKMB", "CKMBINDEX", "TROPONINI" in the last 168 hours. BNP (last 3 results) No results for input(s): "PROBNP" in the last 8760 hours. HbA1C: No results for input(s): "HGBA1C" in the last 72 hours. CBG: No results for input(s): "GLUCAP" in the last 168 hours. Lipid Profile: No results for input(s): "CHOL", "HDL", "LDLCALC", "TRIG", "CHOLHDL", "LDLDIRECT" in the last 72 hours. Thyroid Function Tests: No results for input(s): "TSH", "T4TOTAL", "FREET4", "T3FREE", "THYROIDAB" in the last 72 hours. Anemia Panel: No results for input(s): "VITAMINB12", "FOLATE", "FERRITIN", "TIBC", "IRON", "RETICCTPCT" in the last 72 hours. Sepsis Labs: No results for input(s): "PROCALCITON", "LATICACIDVEN" in the last 168 hours.  Recent Results (from the past 240 hours)  Resp panel by RT-PCR (RSV, Flu A&B, Covid) Anterior Nasal Swab     Status: None   Collection Time: 03/25/24  1:55 PM   Specimen: Anterior Nasal Swab  Result Value Ref Range Status   SARS Coronavirus 2 by RT PCR NEGATIVE NEGATIVE Final    Comment: (NOTE) SARS-CoV-2  target nucleic acids are NOT DETECTED.  The SARS-CoV-2 RNA is generally detectable in upper respiratory specimens during the acute phase of infection. The lowest concentration of SARS-CoV-2 viral copies this assay can detect is 138 copies/mL. A negative result does not preclude SARS-Cov-2 infection and should not be used as the sole basis for treatment or other patient management decisions. A negative result may occur with  improper specimen collection/handling, submission of specimen other than nasopharyngeal swab, presence of viral mutation(s) within the areas targeted by this assay, and inadequate number of viral copies(<138 copies/mL). A negative result must be combined with clinical observations, patient history, and epidemiological information. The expected result is Negative.  Fact Sheet for Patients:  BloggerCourse.com  Fact Sheet for Healthcare Providers:  SeriousBroker.it  This test is no t yet approved or cleared by the [place]  FDA and  has been authorized for detection and/or diagnosis of SARS-CoV-2 by FDA under an Emergency Use Authorization (EUA). This EUA will remain  in effect (meaning this test can be used) for the duration of the COVID-19 declaration under Section 564(b)(1) of the Act, 21 U.S.C.section 360bbb-3(b)(1), unless the  authorization is terminated  or revoked sooner.       Influenza A by PCR NEGATIVE NEGATIVE Final   Influenza B by PCR NEGATIVE NEGATIVE Final    Comment: (NOTE) The Xpert Xpress SARS-CoV-2/FLU/RSV plus assay is intended as an aid in the diagnosis of influenza from Nasopharyngeal swab specimens and should not be used as a sole basis for treatment. Nasal washings and aspirates are unacceptable for Xpert Xpress SARS-CoV-2/FLU/RSV testing.  Fact Sheet for Patients: BloggerCourse.com  Fact Sheet for Healthcare  Providers: SeriousBroker.it  This test is not yet approved or cleared by the [place]  FDA and has been authorized for detection and/or diagnosis of SARS-CoV-2 by FDA under an Emergency Use Authorization (EUA). This EUA will remain in effect (meaning this test can be used) for the duration of the COVID-19 declaration under Section 564(b)(1) of the Act, 21 U.S.C. section 360bbb-3(b)(1), unless the authorization is terminated or revoked.     Resp Syncytial Virus by PCR NEGATIVE NEGATIVE Final    Comment: (NOTE) Fact Sheet for Patients: BloggerCourse.com  Fact Sheet for Healthcare Providers: SeriousBroker.it  This test is not yet approved or cleared by the [place]  FDA and has been authorized for detection and/or diagnosis of SARS-CoV-2 by FDA under an Emergency Use Authorization (EUA). This EUA will remain in effect (meaning this test can be used) for the duration of the COVID-19 declaration under Section 564(b)(1) of the Act, 21 U.S.C. section 360bbb-3(b)(1), unless the authorization is terminated or revoked.  Performed at [facility], [address] [place], [place] [address]          Radiology Studies: No results found.      Scheduled Meds:  atovaquone   1,500 mg Oral Q breakfast   bictegravir-emtricitabine -tenofovir  AF  1 tablet Oral Daily   DULoxetine   30 mg Oral QHS   enoxaparin  (LOVENOX ) injection  40 mg Subcutaneous QHS   folic acid   1 mg Oral Daily   hydrocerin   Topical BID   lidocaine   1 patch Transdermal Q24H   lipase/protease/amylase  24,000 Units Oral TID AC   multivitamin with minerals  1 tablet Oral QHS   pantoprazole   40 mg Oral BID   senna-docusate  2 tablet Oral BID   thiamine   100 mg Oral Daily   Continuous Infusions:   LOS: 115 days       Alphonsus Jeans, MD [facility] Pager [phone]  If 7PM-7AM, please contact  night-coverage [email or web address] 04/01/2024, 8:15 AM

## 2024-04-01 NOTE — TOC Progression Note (Signed)
 Transition of Care [facility]) - Progression Note    Patient Details  Name: Meghan Jarvis MRN: [id number] Date of Birth: December 07, 1976  [facility] CM/SW Contact  Odilia Bennett, LCSW Phone Number: 04/01/2024, 9:06 AM  Clinical Narrative:   CSW sent secure email to DSS social worker to see if there are any updates regarding court hearing.  Expected Discharge Plan and Services         Expected Discharge Date: 01/10/24                                     Social Determinants of Health (SDOH) Interventions SDOH Screenings   Food Insecurity: Patient Unable To Answer (12/09/2023)  Recent Concern: Food Insecurity - Food Insecurity Present (09/26/2023)  Housing: Patient Unable To Answer (12/09/2023)  Recent Concern: Housing - Medium Risk (09/26/2023)  Transportation Needs: Patient Unable To Answer (12/09/2023)  Recent Concern: Transportation Needs - Unmet Transportation Needs (10/11/2023)  Utilities: Patient Unable To Answer (12/09/2023)  Recent Concern: Utilities - At Risk (09/25/2023)  Tobacco Use: High Risk (12/08/2023)    Readmission Risk Interventions     No data to display

## 2024-04-01 NOTE — Progress Notes (Signed)
 Date of Admission:  12/08/2023     ID: Meghan Jarvis is a 48 y.o. 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 [name] is a 48 y.o. with a history of AIDS, not compliant with meds or visits ,  , cocaine use was brought in by EMS after being found unresponsive on the couch  in an aquaintance place-   Subjective: Continues to c/o of rt hip and low back pain  Medications:   atovaquone   1,500 mg Oral Q breakfast   bictegravir-emtricitabine -tenofovir  AF  1 tablet Oral Daily   DULoxetine   30 mg Oral QHS   enoxaparin  (LOVENOX ) injection  40 mg Subcutaneous QHS   folic acid   1 mg Oral Daily   hydrocerin   Topical BID   lidocaine   1 patch Transdermal Q24H   lipase/protease/amylase  24,000 Units Oral TID AC   multivitamin with minerals  1 tablet Oral QHS   pantoprazole   40 mg Oral BID   senna-docusate  2 tablet Oral BID   thiamine   100 mg Oral Daily    Objective: Vital signs in last 24 hours: Patient Vitals for the past 24 hrs:  BP Temp Temp src Pulse Resp SpO2 Weight  04/01/24 1618 103/71 98.9 F (37.2 C) Oral (!) 108 18 99 % --  04/01/24 0236 -- -- -- -- -- -- 86 kg  04/01/24 0223 108/77 97.7 F (36.5 C) -- 100 16 98 % --  03/31/24 2054 111/75 98.2 F (36.8 C) Oral (!) 102 16 100 % --  ambulating Hss1s2 Lungs clear to auscultation CNS grossly non focal  Examination of MSK- restricted and painful movt rt hip  Lab Results    Latest Ref Rng & Units 04/01/2024    6:11 AM 03/18/2024    5:03 AM 03/17/2024    5:25 AM  CBC  WBC 4.0 - 10.5 K/uL 4.6  5.4  4.7   Hemoglobin 12.0 - 15.0 g/dL 9.7  29.5  9.2   Hematocrit 36.0 - 46.0 % 30.4  34.0  28.4   Platelets 150 - 400 K/uL 298   272  246        Latest Ref Rng & Units 04/01/2024    6:11 AM 03/17/2024    5:25 AM 03/12/2024    4:59 AM  CMP  Glucose 70 - 99 mg/dL 621  92    BUN 6 - 20 mg/dL 12  12    Creatinine 3.08 - 1.00 mg/dL 6.57  8.46  9.62   Sodium 135 - 145 mmol/L 140  139    Potassium 3.5 - 5.1 mmol/L 3.8  3.7    Chloride 98 - 111 mmol/L 107  109    CO2 22 - 32 mmol/L 26  24    Calcium  8.9 - 10.3 mg/dL 9.3  8.8        Microbiology: 12/28 BC NG  Toxo IgG > 400 Toxo PCR neg Toxo igM neg RPR NR CMV DNA neg Crypto neg HIV RNA 2 million>> 34, 000 Cd4 is 14 ( 2.4%) repeat on 1/29 is 416 ( 23%) Beta D glucan > 500 (  repeated) Histoplasma neg Fungal antibodies negative Genosure prime- no resistant mutations QuantiFERON gold indeterminate.  No treatment  needed now.  Can repeat it later.  Radiology MRI hip rt done on 02/25/24 Reviewed personally     Intramuscular edema and enhancement within the right gluteus medius and minimus muscles with a central area of non-enhancement in the posterior right gluteus minimus muscle adjacent to the posterior acetabular rim. Similar findings are present at the lateral aspect of the right gluteus maximus muscle adjacent to the greater trochanter. 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 No fluid signal at these locations to suggest abscess formation.   Assessment/Plan:  AIDS- - not been in care since 2021-  Was on Biktarvy  at one time and was non compliant then VL  on admission was 2 million and cd4 is 11  Started Biktarvy  on 12/14/23 .  Genotype no resistance to NRTI, NNRTI, Integrase inhibitor and PI Repeat Vl showed a significant drop in 3 weeks from 2 million to 34,000 . Latest vl is 60 and CD4 is 416 ( 18.8%) from 03/18/24 Continue atovaquone  for toxo and PCP prophylaxis  Significant weight gain 108>>189 pounds in 4 months   Persistent Rt thigh, rt hip pain MRI shows myositis , myonecrosis of the gluteus medius ,  minimus Will discuss with MSK and repeat MRI  She was assesed by ortho and no intervention recommended   Encephalopathy  has resolved ( Likely related to cocaine use on presentation)  Aspiration leading to acute hypoxic respiratory failure with right middle lobe and lower lobe consolidation in Dec 2024 .  Status post intubation and was mechanically ventilated in the early part of the admission.   Took treatment with Zosyn  completed treatment repeat chest x-ray is clear     Rt sided  flank, chest pain-intermittent-- has resolved   CT abd ok- MRI lumbar spine deg changes- no acute findings other than degeneration    Rash- resolved likely was due to bactrim - changed to Atovaquone - Mepron       Pruritus with excoriations and hyperpigmentiaon resolved ? Bactrim  related rash on baseline dry and excoriated skin      No evidence of IRIS   Toxo IgG very high-  MRI brain no CNS lesions  . TOXO PCR negative On  PCP/TOXO  and MAI prophylaxis. Was On bactrim  for PCP and Toxo prophylaxis- because of new rash concerning for bactrim  allergy was changed to atovaquone  Rash resolved  Beta D glucan high > 500 (fungal antibodies negative-  Normalized on repeat testing     Active cocaine use  ETOH abuse    Anemia   leucopenia/thrombocytopenia could be due to AIDS ETOH abuse   resolved  CMV DNA neg , AFB blood culture sent-NG     HIV associated neurocognitive disorder compounded by substance use with no insight in her medical condition     She has no capacity , and waiting for placement- DSS involved   Discussed the management with patient .

## 2024-04-02 DIAGNOSIS — F02818 Dementia in other diseases classified elsewhere, unspecified severity, with other behavioral disturbance: Secondary | ICD-10-CM | POA: Diagnosis not present

## 2024-04-02 DIAGNOSIS — B20 Human immunodeficiency virus [HIV] disease: Secondary | ICD-10-CM | POA: Diagnosis not present

## 2024-04-02 NOTE — Progress Notes (Signed)
 PROGRESS NOTE   HPI was taken from NP [name]: 48 y.o with significant PMH of EtOH abuse ,Cocaine use, Homelessness, Uncontrolled HIV, on BIKTARVY , A-fib, GERD, Hypothyroidism, Diabetes, who presented to the ED with chief complaints of unresponsiveness.   Per ED reports, EMS report that patient was boarding at a friend's house who noticed that she had not woken up the whole day. Patient's friend attempted to wake her up but noticed that she was unresponsive and making "gurgling" sound so they called EMS. On EMS arrival, patient was found to be unconscious and incontinent of urine and feces.     ED Course: Initial vital signs showed HR of 118 beats/minute, BP 90/60 mm Hg, the RR 25 breaths/minute, and the oxygen saturation 98% on NRB and a temperature of 100.42F. Patient was obtunded with no gag reflex with debris around the mouth and nose concerning for stomach contents. She was emergently intubated for airway protection. Pertinent Labs/Diagnostics Findings: Na+/ K+:140/2.6  Glucose: 117 BUN/Cr.:33/1.18 AST/ALT:45/17 WBC:9.6 K/L PCT: 2.2 Lactic acid: 1.1 COVID PCR: Negative,  ABG: pO2 139; pCO2 27; pH 7.51;  HCO3 21.5, %O2 Sat 100.  CXR> see full report  Medication administered in the ED: Patient given 30 cc/kg of fluids and started on broad-spectrum antibiotics Ceftriaxone  and Azithromycin  for suspected sepsis Disposition:ICU   As per Dr. Broadus Canes 4/16-4/22/25: Pt has remained medically stable. Still awaiting guardianship hearing date. 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 Poor historian and does not have capacity to make decisions as per psych. Still waiting on guardianship hearing date. Continue on biktarvy . Continue on atovaquone  for PCP ppx  HIV associated neurocognitive disorder: MRI did not show any enhancing lesions but did show severe cerebral atrophy likely secondary to HIV/AIDS.    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 Continue w/ conservative management   Diarrhea: Resolved    Hypotension: resolved. Holding midodrine     Social issues: homeless and has polysubstance use disorder (alcohol, cocaine).  Pt's boyfriend/fiance' is not able to visit the pt anymore b/c concern of him bringing the pt alcohol and/or illicit drugs. Still awaiting guardianship. No safe d/c plan      DVT prophylaxis: lovenox  Code Status: full  Family Communication:  Disposition Plan: awaiting guardianship still. Unsafe d/c plan   Level of care: Med-Surg Consultants:  Psych  Ortho surg  ID  Procedures:  Antimicrobials:   Subjective: No significant events overnight, patient was sitting comfortably on the bed.  Patient stated that she is ready to go.  Denied any specific complaints.  Objective: Vitals:   04/01/24 1618 04/01/24 2129 04/01/24 2146 04/02/24 0553  BP: 103/71 118/86  119/81  Pulse: (!) 108 95  98  Resp: 18 20  20   Temp: 98.9 F (37.2 C)  99.6 F (37.6 C) 98.2 F (36.8 C)  TempSrc: Oral  Oral Oral  SpO2: 99% 100%  98%  Weight:      Height:        Intake/Output Summary (Last 24 hours) at 04/02/2024 1449 Last data  filed at 04/02/2024 1300 Gross per 24 hour  Intake 555 ml  Output --  Net 555 ml   Filed Weights   03/30/24 0245 03/31/24 0500 04/01/24 0236  Weight: 86.1 kg 85.2 kg 86 kg    Examination:  General exam: Appears calm & comfortable  Respiratory system: clear breath sounds b/l  Cardiovascular system: S1 & S2+. No rubs or gallops   Gastrointestinal  system: abd is soft, NT, ND & hypoactive bowel sounds  Central nervous system: alert & awake. Moves all extremities  Psychiatry: judgement and insight appears poor. Flat mood and affect    Data Reviewed: I have personally reviewed following labs and imaging studies  CBC: Recent Labs  Lab 04/01/24 0611  WBC 4.6  HGB 9.7*  HCT 30.4*  MCV 84.7  PLT 298   Basic Metabolic Panel: Recent Labs  Lab 04/01/24 0611  NA 140  K 3.8  CL 107  CO2 26  GLUCOSE 115*  BUN 12  CREATININE 0.72  CALCIUM  9.3   GFR: Estimated Creatinine Clearance: 97.6 mL/min (by C-G formula based on SCr of 0.72 mg/dL). Liver Function Tests: No results for input(s): "AST", "ALT", "ALKPHOS", "BILITOT", "PROT", "ALBUMIN" in the last 168 hours. No results for input(s): "LIPASE", "AMYLASE" in the last 168 hours. No results for input(s): "AMMONIA" in the last 168 hours. Coagulation Profile: No results for input(s): "INR", "PROTIME" in the last 168 hours. Cardiac Enzymes: No results for input(s): "CKTOTAL", "CKMB", "CKMBINDEX", "TROPONINI" in the last 168 hours. BNP (last 3 results) No results for input(s): "PROBNP" in the last 8760 hours. HbA1C: No results for input(s): "HGBA1C" in the last 72 hours. CBG: No results for input(s): "GLUCAP" in the last 168 hours. Lipid Profile: No results for input(s): "CHOL", "HDL", "LDLCALC", "TRIG", "CHOLHDL", "LDLDIRECT" in the last 72 hours. Thyroid Function Tests: No results for input(s): "TSH", "T4TOTAL", "FREET4", "T3FREE", "THYROIDAB" in the last 72 hours. Anemia Panel: No results for input(s): "VITAMINB12", "FOLATE", "FERRITIN", "TIBC", "IRON", "RETICCTPCT" in the last 72 hours. Sepsis Labs: No results for input(s): "PROCALCITON", "LATICACIDVEN" in the last 168 hours.  Recent Results (from the past 240 hours)  Resp panel by RT-PCR (RSV, Flu A&B, Covid) Anterior Nasal Swab     Status: None   Collection Time: 03/25/24  1:55 PM   Specimen: Anterior Nasal Swab   Result Value Ref Range Status   SARS Coronavirus 2 by RT PCR NEGATIVE NEGATIVE Final    Comment: (NOTE) SARS-CoV-2 target nucleic acids are NOT DETECTED.  The SARS-CoV-2 RNA is generally detectable in upper respiratory specimens during the acute phase of infection. The lowest concentration of SARS-CoV-2 viral copies this assay can detect is 138 copies/mL. A negative result does not preclude SARS-Cov-2 infection and should not be used as the sole basis for treatment or other patient management decisions. A negative result may occur with  improper specimen collection/handling, submission of specimen other than nasopharyngeal swab, presence of viral mutation(s) within the areas targeted by this assay, and inadequate number of viral copies(<138 copies/mL). A negative result must be combined with clinical observations, patient history, and epidemiological information. The expected result is Negative.  Fact Sheet for Patients:  BloggerCourse.com  Fact Sheet for Healthcare Providers:  SeriousBroker.it  This test is no t yet approved or cleared by the [place]  FDA and  has been authorized for detection and/or diagnosis of SARS-CoV-2 by FDA under an Emergency Use Authorization (EUA). This EUA will remain  in effect (meaning this test can be used) for  the duration of the COVID-19 declaration under Section 564(b)(1) of the Act, 21 U.S.C.section 360bbb-3(b)(1), unless the authorization is terminated  or revoked sooner.       Influenza A by PCR NEGATIVE NEGATIVE Final   Influenza B by PCR NEGATIVE NEGATIVE Final    Comment: (NOTE) The Xpert Xpress SARS-CoV-2/FLU/RSV plus assay is intended as an aid in the diagnosis of influenza from Nasopharyngeal swab specimens and should not be used as a sole basis for treatment. Nasal washings and aspirates are unacceptable for Xpert Xpress SARS-CoV-2/FLU/RSV testing.  Fact Sheet for  Patients: BloggerCourse.com  Fact Sheet for Healthcare Providers: SeriousBroker.it  This test is not yet approved or cleared by the [place]  FDA and has been authorized for detection and/or diagnosis of SARS-CoV-2 by FDA under an Emergency Use Authorization (EUA). This EUA will remain in effect (meaning this test can be used) for the duration of the COVID-19 declaration under Section 564(b)(1) of the Act, 21 U.S.C. section 360bbb-3(b)(1), unless the authorization is terminated or revoked.     Resp Syncytial Virus by PCR NEGATIVE NEGATIVE Final    Comment: (NOTE) Fact Sheet for Patients: BloggerCourse.com  Fact Sheet for Healthcare Providers: SeriousBroker.it  This test is not yet approved or cleared by the [place]  FDA and has been authorized for detection and/or diagnosis of SARS-CoV-2 by FDA under an Emergency Use Authorization (EUA). This EUA will remain in effect (meaning this test can be used) for the duration of the COVID-19 declaration under Section 564(b)(1) of the Act, 21 U.S.C. section 360bbb-3(b)(1), unless the authorization is terminated or revoked.  Performed at [facility], [address] [place], [place] [address]          Radiology Studies: No results found.      Scheduled Meds:  atovaquone   1,500 mg Oral Q breakfast   bictegravir-emtricitabine -tenofovir  AF  1 tablet Oral Daily   DULoxetine   30 mg Oral QHS   enoxaparin  (LOVENOX ) injection  40 mg Subcutaneous QHS   folic acid   1 mg Oral Daily   hydrocerin   Topical BID   lidocaine   1 patch Transdermal Q24H   lipase/protease/amylase  24,000 Units Oral TID AC   multivitamin with minerals  1 tablet Oral QHS   pantoprazole   40 mg Oral BID   senna-docusate  2 tablet Oral BID   thiamine   100 mg Oral Daily   Continuous Infusions:   LOS: 116 days       Meghan Atlas,  MD [facility] Pager [phone]  If 7PM-7AM, please contact night-coverage [email or web address] 04/02/2024, 2:49 PM

## 2024-04-02 NOTE — Progress Notes (Signed)
 While giving patient her night medications. I was cleaning up her recliner and found a tooth sitting on her recliner. Patient stated "oh yea it just fell out it doesn't hurt". Nurse encouraged patient to be brushing her teeth. Patient is in no distress with no active bleeding in mouth.   Concern also for patients weight gain during this admission. 45kg at admission to 86kg on 04/01/24  Will continue to monitor patient.

## 2024-04-03 DIAGNOSIS — B20 Human immunodeficiency virus [HIV] disease: Secondary | ICD-10-CM | POA: Diagnosis not present

## 2024-04-03 DIAGNOSIS — F02818 Dementia in other diseases classified elsewhere, unspecified severity, with other behavioral disturbance: Secondary | ICD-10-CM | POA: Diagnosis not present

## 2024-04-03 LAB — BASIC METABOLIC PANEL WITH GFR
Anion gap: 6 (ref 5–15)
BUN: 14 mg/dL (ref 6–20)
CO2: 25 mmol/L (ref 22–32)
Calcium: 9.2 mg/dL (ref 8.9–10.3)
Chloride: 107 mmol/L (ref 98–111)
Creatinine, Ser: 0.79 mg/dL (ref 0.44–1.00)
GFR, Estimated: >60 mL/min (ref >60)
Glucose, Bld: 116 mg/dL — ABNORMAL HIGH (ref 70–99)
Potassium: 3.8 mmol/L (ref 3.5–5.1)
Sodium: 138 mmol/L (ref 135–145)

## 2024-04-03 LAB — CBC
HCT: 30.1 % — ABNORMAL LOW (ref 36.0–46.0)
Hemoglobin: 9.5 g/dL — ABNORMAL LOW (ref 12.0–15.0)
MCH: 26.7 pg (ref 26.0–34.0)
MCHC: 31.6 g/dL (ref 30.0–36.0)
MCV: 84.6 fL (ref 80.0–100.0)
Platelets: 295 10*3/uL (ref 150–400)
RBC: 3.56 MIL/uL — ABNORMAL LOW (ref 3.87–5.11)
RDW: 14.7 % (ref 11.5–15.5)
WBC: 4.4 10*3/uL (ref 4.0–10.5)
nRBC: 0 % (ref 0.0–0.2)

## 2024-04-03 LAB — PHOSPHORUS: Phosphorus: 4.7 mg/dL — ABNORMAL HIGH (ref 2.5–4.6)

## 2024-04-03 LAB — MAGNESIUM: Magnesium: 1.8 mg/dL (ref 1.7–2.4)

## 2024-04-03 MED ORDER — ORAL CARE MOUTH RINSE
15.0000 mL | OROMUCOSAL | Status: DC
Start: 2024-04-04 — End: 2024-04-03

## 2024-04-03 MED ORDER — BIOTENE DRY MOUTH MT LIQD
15.0000 mL | Freq: Every day | OROMUCOSAL | Status: DC
Start: 2024-04-03 — End: 2024-04-13
  Administered 2024-04-06 – 2024-04-11 (×5): 15 mL via OROMUCOSAL

## 2024-04-03 MED ORDER — ORAL CARE MOUTH RINSE: 15.0000 mL | OROMUCOSAL | Status: DC | PRN

## 2024-04-03 MED ORDER — CHLORHEXIDINE GLUCONATE 0.12 % MT SOLN
15.0000 mL | Freq: Two times a day (BID) | OROMUCOSAL | Status: DC
  Administered 2024-04-04 – 2024-04-24 (×43): 15 mL via OROMUCOSAL
  Filled 2024-04-03 (×42): qty 15

## 2024-04-03 NOTE — TOC Progression Note (Addendum)
 Transition of Care [facility]) - Progression Note    Patient Details  Name: Meghan Jarvis MRN: [id number] Date of Birth: 01-Jul-1976  Transition of Care [facility]) CM/SW Contact  Odilia Bennett, LCSW Phone Number: 04/03/2024, 11:44 AM  Clinical Narrative:   CSW left voicemail for DSS social worker to see if there are any updates on court date.  4:12 pm: Patient asked team to speak to social worker. CSW met with patient and provided update. She stated she doesn't want to be here anymore "period." When asked if she had any plan to hurt herself she said no. When asked if she had any intention to hurt herself she said "I don't want to be here anymore." MD and RN are aware.  Expected Discharge Plan and Services         Expected Discharge Date: 01/10/24                                     Social Determinants of Health (SDOH) Interventions SDOH Screenings   Food Insecurity: Patient Unable To Answer (12/09/2023)  Recent Concern: Food Insecurity - Food Insecurity Present (09/26/2023)  Housing: Patient Unable To Answer (12/09/2023)  Recent Concern: Housing - Medium Risk (09/26/2023)  Transportation Needs: Patient Unable To Answer (12/09/2023)  Recent Concern: Transportation Needs - Unmet Transportation Needs (10/11/2023)  Utilities: Patient Unable To Answer (12/09/2023)  Recent Concern: Utilities - At Risk (09/25/2023)  Tobacco Use: High Risk (12/08/2023)    Readmission Risk Interventions     No data to display

## 2024-04-03 NOTE — Progress Notes (Signed)
 MD made aware the patients statement to Duke Gibbons, LCSW, regarding not wanting to be here anymore. Per MD, Continue with telesitter. No new orders noted.  [name]

## 2024-04-03 NOTE — Progress Notes (Signed)
 PROGRESS NOTE   Meghan Jarvis  ZOX:[id number] DOB: 07/12/1976 DOA: 12/08/2023 [name]    HPI was taken from NP [name]: 48 y.o with significant PMH of EtOH abuse ,Cocaine use, Homelessness, Uncontrolled HIV, on BIKTARVY , A-fib, GERD, Hypothyroidism, Diabetes, who presented to the ED with chief complaints of unresponsiveness.   Per ED reports, EMS report that patient was boarding at a friend's house who noticed that she had not woken up the whole day. Patient's friend attempted to wake her up but noticed that she was unresponsive and making "gurgling" sound so they called EMS. On EMS arrival, patient was found to be unconscious and incontinent of urine and feces.     ED Course: Initial vital signs showed HR of 118 beats/minute, BP 90/60 mm Hg, the RR 25 breaths/minute, and the oxygen saturation 98% on NRB and a temperature of 100.66F. Patient was obtunded with no gag reflex with debris around the mouth and nose concerning for stomach contents. She was emergently intubated for airway protection. Pertinent Labs/Diagnostics Findings: Na+/ K+:140/2.6  Glucose: 117 BUN/Cr.:33/1.18 AST/ALT:45/17 WBC:9.6 K/L PCT: 2.2 Lactic acid: 1.1 COVID PCR: Negative,  ABG: pO2 139; pCO2 27; pH 7.51;  HCO3 21.5, %O2 Sat 100.  CXR> see full report  Medication administered in the ED: Patient given 30 cc/kg of fluids and started on broad-spectrum antibiotics Ceftriaxone  and Azithromycin  for suspected sepsis Disposition:ICU   As per Dr. Broadus Canes 4/16-4/22/25: Pt has remained medically stable. Still awaiting guardianship hearing date. 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 Poor historian and does not have capacity to make decisions as per psych. Still waiting on guardianship hearing date. Continue on biktarvy . Continue on atovaquone  for PCP ppx  HIV associated neurocognitive disorder: MRI did not show any enhancing lesions but did show severe cerebral atrophy likely secondary to HIV/AIDS.    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 Continue w/ conservative management   Diarrhea: Resolved    Hypotension: resolved. Holding midodrine    Poor dental hygiene, one left maxillary tooth fell off on 4/23 Started oral rinse nightly for 2 weeks   Social issues: homeless and has polysubstance use disorder (alcohol, cocaine).  Pt's boyfriend/fiance' is not able to visit the pt anymore b/c concern of him bringing the pt alcohol and/or illicit drugs. Still awaiting guardianship. No safe d/c plan      DVT prophylaxis: lovenox  Code Status: full  Family Communication:  Disposition Plan: awaiting guardianship still. Unsafe d/c plan   Level of care: Med-Surg Consultants:  Psych  Ortho surg  ID  Procedures:  Antimicrobials:   Subjective: No significant events overnight, patient was laying comfortably in the bed, denied any complaints.  She wanted to talk to social worker regarding discharge planning.   Objective: Vitals:   04/02/24 0553 04/02/24 2019 04/03/24 0357 04/03/24 0500  BP: 119/81 116/73 113/73   Pulse: 98 96 89   Resp: 20 20 20    Temp: 98.2 F (36.8 C) 98.2 F (36.8 C) 97.9 F (36.6 C)   TempSrc: Oral Oral Oral   SpO2: 98% 100% 100%   Weight:  88.9 kg  Height:        Intake/Output Summary (Last 24 hours) at 04/03/2024 1733 Last data filed at 04/03/2024 1300 Gross per 24 hour  Intake 0 ml  Output --  Net 0 ml   Filed Weights   03/31/24 0500 04/01/24 0236 04/03/24 0500  Weight: 85.2 kg 86 kg 88.9 kg    Examination:  General exam: Appears calm & comfortable   Respiratory system: clear breath sounds b/l  Cardiovascular system: S1 & S2+. No rubs or gallops   Gastrointestinal system: abd is soft, NT, ND & hypoactive bowel sounds  Central nervous system: alert & awake. Moves all extremities  Psychiatry: judgement and insight appears poor. Flat mood and affect    Data Reviewed: I have personally reviewed following labs and imaging studies  CBC: Recent Labs  Lab 04/01/24 0611 04/03/24 0440  WBC 4.6 4.4  HGB 9.7* 9.5*  HCT 30.4* 30.1*  MCV 84.7 84.6  PLT 298 295   Basic Metabolic Panel: Recent Labs  Lab 04/01/24 0611 04/03/24 0440  NA 140 138  K 3.8 3.8  CL 107 107  CO2 26 25  GLUCOSE 115* 116*  BUN 12 14  CREATININE 0.72 0.79  CALCIUM  9.3 9.2  MG  --  1.8  PHOS  --  4.7*   GFR: Estimated Creatinine Clearance: 99.2 mL/min (by C-G formula based on SCr of 0.79 mg/dL). Liver Function Tests: No results for input(s): "AST", "ALT", "ALKPHOS", "BILITOT", "PROT", "ALBUMIN" in the last 168 hours. No results for input(s): "LIPASE", "AMYLASE" in the last 168 hours. No results for input(s): "AMMONIA" in the last 168 hours. Coagulation Profile: No results for input(s): "INR", "PROTIME" in the last 168 hours. Cardiac Enzymes: No results for input(s): "CKTOTAL", "CKMB", "CKMBINDEX", "TROPONINI" in the last 168 hours. BNP (last 3 results) No results for input(s): "PROBNP" in the last 8760 hours. HbA1C: No results for input(s): "HGBA1C" in the last 72 hours. CBG: No results for input(s): "GLUCAP" in the last 168 hours. Lipid Profile: No results for input(s): "CHOL", "HDL", "LDLCALC", "TRIG", "CHOLHDL", "LDLDIRECT" in the last 72 hours. Thyroid Function Tests: No results for input(s): "TSH", "T4TOTAL", "FREET4", "T3FREE", "THYROIDAB" in the last 72 hours. Anemia Panel: No results for input(s): "VITAMINB12", "FOLATE", "FERRITIN", "TIBC", "IRON", "RETICCTPCT" in the last 72 hours. Sepsis Labs: No results for input(s): "PROCALCITON",  "LATICACIDVEN" in the last 168 hours.  Recent Results (from the past 240 hours)  Resp panel by RT-PCR (RSV, Flu A&B, Covid) Anterior Nasal Swab     Status: None   Collection Time: 03/25/24  1:55 PM   Specimen: Anterior Nasal Swab  Result Value Ref Range Status   SARS Coronavirus 2 by RT PCR NEGATIVE NEGATIVE Final    Comment: (NOTE) SARS-CoV-2 target nucleic acids are NOT DETECTED.  The SARS-CoV-2 RNA is generally detectable in upper respiratory specimens during the acute phase of infection. The lowest concentration of SARS-CoV-2 viral copies this assay can detect is 138 copies/mL. A negative result does not preclude SARS-Cov-2 infection and should not be used as the sole basis for treatment or other patient management decisions. A negative result may occur with  improper specimen collection/handling, submission of specimen other than nasopharyngeal swab, presence of viral mutation(s) within the areas targeted by this assay, and inadequate number of viral copies(<138 copies/mL). A negative result must be combined with clinical observations, patient history, and epidemiological information. The expected result is Negative.  Fact Sheet for Patients:  BloggerCourse.com  Fact Sheet for Healthcare Providers:  SeriousBroker.it  This test is no t yet approved or cleared by the [place]  FDA and  has been authorized for detection and/or diagnosis of SARS-CoV-2 by FDA under an Emergency Use Authorization (EUA). This EUA will remain  in effect (meaning this test can be used) for the duration of the COVID-19 declaration under Section 564(b)(1) of the Act, 21 U.S.C.section 360bbb-3(b)(1), unless the authorization is terminated  or revoked sooner.       Influenza A by PCR NEGATIVE NEGATIVE Final   Influenza B by PCR NEGATIVE NEGATIVE Final    Comment: (NOTE) The Xpert Xpress SARS-CoV-2/FLU/RSV plus assay is intended as an aid in the  diagnosis of influenza from Nasopharyngeal swab specimens and should not be used as a sole basis for treatment. Nasal washings and aspirates are unacceptable for Xpert Xpress SARS-CoV-2/FLU/RSV testing.  Fact Sheet for Patients: BloggerCourse.com  Fact Sheet for Healthcare Providers: SeriousBroker.it  This test is not yet approved or cleared by the [place]  FDA and has been authorized for detection and/or diagnosis of SARS-CoV-2 by FDA under an Emergency Use Authorization (EUA). This EUA will remain in effect (meaning this test can be used) for the duration of the COVID-19 declaration under Section 564(b)(1) of the Act, 21 U.S.C. section 360bbb-3(b)(1), unless the authorization is terminated or revoked.     Resp Syncytial Virus by PCR NEGATIVE NEGATIVE Final    Comment: (NOTE) Fact Sheet for Patients: BloggerCourse.com  Fact Sheet for Healthcare Providers: SeriousBroker.it  This test is not yet approved or cleared by the [place]  FDA and has been authorized for detection and/or diagnosis of SARS-CoV-2 by FDA under an Emergency Use Authorization (EUA). This EUA will remain in effect (meaning this test can be used) for the duration of the COVID-19 declaration under Section 564(b)(1) of the Act, 21 U.S.C. section 360bbb-3(b)(1), unless the authorization is terminated or revoked.  Performed at [facility], [address] [place], [place] [address]          Radiology Studies: No results found.      Scheduled Meds:  antiseptic oral rinse  15 mL Mouth Rinse QPC supper   atovaquone   1,500 mg Oral Q breakfast   bictegravir-emtricitabine -tenofovir  AF  1 tablet Oral Daily   DULoxetine   30 mg Oral QHS   enoxaparin  (LOVENOX ) injection  40 mg Subcutaneous QHS   folic acid   1 mg Oral Daily   hydrocerin   Topical BID   lidocaine   1 patch Transdermal Q24H    lipase/protease/amylase  24,000 Units Oral TID AC   multivitamin with minerals  1 tablet Oral QHS   pantoprazole   40 mg Oral BID   senna-docusate  2 tablet Oral BID   thiamine   100 mg Oral Daily   Continuous Infusions:   LOS: 117 days       Althia Atlas, MD [facility] Pager [phone]  If 7PM-7AM, please contact night-coverage [email or web address] 04/03/2024, 5:33 PM

## 2024-04-04 DIAGNOSIS — M60851 Other myositis, right thigh: Secondary | ICD-10-CM | POA: Diagnosis not present

## 2024-04-04 DIAGNOSIS — B20 Human immunodeficiency virus [HIV] disease: Secondary | ICD-10-CM | POA: Diagnosis not present

## 2024-04-04 DIAGNOSIS — F03B3 Unspecified dementia, moderate, with mood disturbance: Secondary | ICD-10-CM | POA: Diagnosis not present

## 2024-04-04 DIAGNOSIS — A480 Gas gangrene: Secondary | ICD-10-CM | POA: Diagnosis not present

## 2024-04-04 DIAGNOSIS — F02818 Dementia in other diseases classified elsewhere, unspecified severity, with other behavioral disturbance: Secondary | ICD-10-CM | POA: Diagnosis not present

## 2024-04-04 MED ORDER — HYDROXYZINE HCL 25 MG PO TABS
25.0000 mg | ORAL_TABLET | Freq: Three times a day (TID) | ORAL | Status: DC | PRN
  Administered 2024-04-19 – 2024-05-02 (×4): 25 mg via ORAL
  Filled 2024-04-04 (×4): qty 1

## 2024-04-04 MED ORDER — DULOXETINE HCL 30 MG PO CPEP
60.0000 mg | ORAL_CAPSULE | Freq: Every day | ORAL | Status: DC
  Administered 2024-04-04 – 2024-05-07 (×34): 60 mg via ORAL
  Filled 2024-04-04 (×34): qty 2

## 2024-04-04 NOTE — Progress Notes (Signed)
 PROGRESS NOTE   Meghan Jarvis  JWJ:[id number] DOB: November 15, 1976 DOA: 12/08/2023 [name]    HPI was taken from NP [name]: 48 y.o with significant PMH of EtOH abuse ,Cocaine use, Homelessness, Uncontrolled HIV, on BIKTARVY , A-fib, GERD, Hypothyroidism, Diabetes, who presented to the ED with chief complaints of unresponsiveness.   Per ED reports, EMS report that patient was boarding at a friend's house who noticed that she had not woken up the whole day. Patient's friend attempted to wake her up but noticed that she was unresponsive and making "gurgling" sound so they called EMS. On EMS arrival, patient was found to be unconscious and incontinent of urine and feces.     ED Course: Initial vital signs showed HR of 118 beats/minute, BP 90/60 mm Hg, the RR 25 breaths/minute, and the oxygen saturation 98% on NRB and a temperature of 100.27F. Patient was obtunded with no gag reflex with debris around the mouth and nose concerning for stomach contents. She was emergently intubated for airway protection. Pertinent Labs/Diagnostics Findings: Na+/ K+:140/2.6  Glucose: 117 BUN/Cr.:33/1.18 AST/ALT:45/17 WBC:9.6 K/L PCT: 2.2 Lactic acid: 1.1 COVID PCR: Negative,  ABG: pO2 139; pCO2 27; pH 7.51;  HCO3 21.5, %O2 Sat 100.  CXR> see full report  Medication administered in the ED: Patient given 30 cc/kg of fluids and started on broad-spectrum antibiotics Ceftriaxone  and Azithromycin  for suspected sepsis Disposition:ICU   As per Dr. Broadus Canes 4/16-4/22/25: Pt has remained medically stable. Still awaiting guardianship hearing date. 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 Poor historian and does not have capacity to make decisions as per psych. Still waiting on guardianship hearing date. Continue on biktarvy . Continue on atovaquone  for PCP ppx  HIV associated neurocognitive disorder: MRI did not show any enhancing lesions but did show severe cerebral atrophy likely secondary to HIV/AIDS.    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 Continue w/ conservative management   Diarrhea: Resolved    Hypotension: resolved. Holding midodrine    Poor dental hygiene, one left maxillary tooth fell off on 4/23 Started oral rinse nightly for 2 weeks  Depression due to prolonged stay in the hospital and unable to see significant other. Psych consulted for further recommendation.   Social issues: homeless and has polysubstance use disorder (alcohol, cocaine).  Pt's boyfriend/fiance' is not able to visit the pt anymore b/c concern of him bringing the pt alcohol and/or illicit drugs. Still awaiting guardianship. No safe d/c plan      DVT prophylaxis: lovenox  Code Status: full  Family Communication:  Disposition Plan: awaiting guardianship still. Unsafe d/c plan   Level of care: Med-Surg Consultants:  Psych  Ortho surg  ID  Procedures:  Antimicrobials:   Subjective: No significant events overnight, patient was laying comfortably in the bed, denied any complaints.  She wanted to talk to social worker regarding discharge planning.   Objective: Vitals:   04/03/24 2049 04/04/24 0357 04/04/24 0500 04/04/24 0818  BP: 131/88 118/79  (!) 123/90  Pulse: 94 89  (!) 58  Resp: 20 20  18   Temp: 98.2 F (36.8 C) 98.1 F (36.7 C)  98.6 F (37 C)  TempSrc: Oral Oral  Oral  SpO2: 99% 98%  91%  Weight:   89.1 kg   Height:        Intake/Output Summary (Last 24 hours) at 04/04/2024 1441 Last data filed at 04/04/2024 0000 Gross per 24 hour  Intake 750 ml  Output --  Net 750 ml   Filed  Weights   04/01/24 0236 04/03/24 0500 04/04/24 0500  Weight: 86 kg 88.9 kg 89.1 kg    Examination:  General exam: Appears calm & comfortable  Respiratory system: clear breath sounds b/l  Cardiovascular system: S1 & S2+. No rubs or gallops   Gastrointestinal system: abd is soft, NT, ND & hypoactive bowel sounds  Central nervous system: alert & awake. Moves all extremities  Psychiatry: judgement and insight appears poor. Flat mood and affect    Data Reviewed: I have personally reviewed following labs and imaging studies  CBC: Recent Labs  Lab 04/01/24 0611 04/03/24 0440  WBC 4.6 4.4  HGB 9.7* 9.5*  HCT 30.4* 30.1*  MCV 84.7 84.6  PLT 298 295   Basic Metabolic Panel: Recent Labs  Lab 04/01/24 0611 04/03/24 0440  NA 140 138  K 3.8 3.8  CL 107 107  CO2 26 25  GLUCOSE 115* 116*  BUN 12 14  CREATININE 0.72 0.79  CALCIUM  9.3 9.2  MG  --  1.8  PHOS  --  4.7*   GFR: Estimated Creatinine Clearance: 99.4 mL/min (by C-G formula based on SCr of 0.79 mg/dL). Liver Function Tests: No results for input(s): "AST", "ALT", "ALKPHOS", "BILITOT", "PROT", "ALBUMIN" in the last 168 hours. No results for input(s): "LIPASE", "AMYLASE" in the last 168 hours. No results for input(s): "AMMONIA" in the last 168 hours. Coagulation Profile: No results for input(s): "INR", "PROTIME" in the last 168 hours. Cardiac Enzymes: No results for input(s): "CKTOTAL", "CKMB", "CKMBINDEX", "TROPONINI" in the last 168 hours. BNP (last 3 results) No results for input(s): "PROBNP" in the last 8760 hours. HbA1C: No results for input(s): "HGBA1C" in the last 72 hours. CBG: No results for input(s): "GLUCAP" in the last 168 hours. Lipid Profile: No results for input(s): "CHOL", "HDL", "LDLCALC", "TRIG", "CHOLHDL", "LDLDIRECT" in the last 72 hours. Thyroid Function Tests: No results for input(s): "TSH", "T4TOTAL", "FREET4", "T3FREE", "THYROIDAB" in the last 72 hours. Anemia Panel: No results for  input(s): "VITAMINB12", "FOLATE", "FERRITIN", "TIBC", "IRON", "RETICCTPCT" in the last 72 hours. Sepsis Labs: No results for input(s): "PROCALCITON", "LATICACIDVEN" in the last 168 hours.  No results found for this or any previous visit (from the past 240 hours).        Radiology Studies: No results found.      Scheduled Meds:  antiseptic oral rinse  15 mL Mouth Rinse QPC supper   atovaquone   1,500 mg Oral Q breakfast   bictegravir-emtricitabine -tenofovir  AF  1 tablet Oral Daily   chlorhexidine   15 mL Mouth/Throat BID   DULoxetine   60 mg Oral Daily   enoxaparin  (LOVENOX ) injection  40 mg Subcutaneous QHS   folic acid   1 mg Oral Daily   hydrocerin   Topical BID   lidocaine   1 patch Transdermal Q24H   lipase/protease/amylase  24,000 Units Oral TID AC   multivitamin with minerals  1 tablet Oral QHS   pantoprazole   40 mg Oral BID   senna-docusate  2 tablet Oral BID   thiamine   100 mg Oral Daily   Continuous Infusions:   LOS: 118 days  Althia Atlas, MD [facility] Pager [phone]  If 7PM-7AM, please contact night-coverage [email or web address] 04/04/2024, 2:41 PM

## 2024-04-04 NOTE — Consult Note (Signed)
 [facility] Psychiatric Consult Initial  Patient Name: .Meghan Jarvis  MRN: [id number]  DOB: Apr 20, 1976  Consult Order details:  Orders (From admission, onward)     Start     Ordered   04/04/24 0817  IP CONSULT TO PSYCHIATRY       Ordering Provider: Althia Atlas, MD  Provider:  (Not yet assigned)  Question Answer Comment  Location [facility]   Reason for Consult? Depression due to prolonged stay in the hospital      04/04/24 0818   01/28/24 1508  IP CONSULT TO PSYCHIATRY       Ordering Provider: Ezzard Holms, MD  Provider:  (Not yet assigned)  Question Answer Comment  Location [facility]   Reason for Consult? Capacity evaluation      01/28/24 1507   01/07/24 1611  IP CONSULT TO PSYCHIATRY       Ordering Provider: Luna Salinas, MD  Provider:  (Not yet assigned)  Question Answer Comment  Location [facility]   Reason for Consult? To evaluate for capacity to make medical decisions      01/07/24 1610   12/24/23 0739  IP CONSULT TO PSYCHIATRY       Ordering Provider: Dezii, Alexandra, DO  Provider:  (Not yet assigned)  Question Answer Comment  Location [facility]   Reason for Consult? capacity assessment      12/24/23 0738             Mode of Visit: In person    Psychiatry Consult Evaluation  Service Date: April 04, 2024 LOS:  LOS: 118 days  Chief Complaint "I want to go, I miss my husband Kendra Pavy"  Primary Psychiatric Diagnoses  Substance abuse 2.  Depression 3.  Substance induced Mood Disorder   Chart review:   48 y.o with significant PMH of EtOH abuse ,Cocaine use, Homelessness, Uncontrolled HIV, on BIKTARVY , A-fib, GERD, Hypothyroidism, Diabetes, who presented to the ED with chief complaints of unresponsiveness.   Per ED reports, EMS report that patient was boarding at a friend's house who noticed that she had not woken up the whole day. Patient's friend attempted  to wake her up but noticed that she was unresponsive and making "gurgling" sound so they called EMS. On EMS arrival, patient was found to be unconscious and incontinent of urine and feces.     ED Course: Initial vital signs showed HR of 118 beats/minute, BP 90/60 mm Hg, the RR 25 breaths/minute, and the oxygen saturation 98% on NRB and a temperature of 100.70F. Patient was obtunded with no gag reflex with debris around the mouth and nose concerning for stomach contents. She was emergently intubated for airway protection. Pertinent Labs/Diagnostics Findings: Na+/ K+:140/2.6  Glucose: 117 BUN/Cr.:33/1.18 AST/ALT:45/17 WBC:9.6 K/L PCT: 2.2 Lactic acid: 1.1 COVID PCR: Negative,  ABG: pO2 139; pCO2 27; pH 7.51;  HCO3 21.5, %O2 Sat 100.  CXR> see full report  Medication administered in the ED: Patient given 30 cc/kg of fluids and started on broad-spectrum antibiotics Ceftriaxone  and Azithromycin  for suspected sepsis Disposition:ICU   Assessment  Meghan Jarvis is a 48 year-old female sitting in her bed, alert and oriented. She is pleasant upon approach and receptive. She is casually dressed with decent hygiene. She appears healthy and well nourished. Patient is fully focused throughout this assessment. She does not seem to be preoccupied. Does not seem to be responding to internal stimuli. Patient denies hallucinations. Denies Homicidal ideations. Denies  suicidal ideations stating "that's not my life, I've got a life, God has given me another chance". Patient admits to hx of depression and was experiencing symptoms upon admission. She reports that her admission was precipitated by "I was doing crack...been clean for 4 months now".  Patient reports that "I am very happy, I will be happier when I have my man with me". Reports she has a boyfriend who loves and supports her. States she has been dating him for about 8 years. States "I am ready to start a new life with my man". When asked about her thought  process, patient jokes and states "something in mid, sometimes I hear voices and I am sure I am not the only one who does".  Patient admits to using crack increasingly prior to admission. Reports hx of alcohol use when socializing. Reports smoking about 1/2 pack when socializing. Reports she is homeless but getting ready to get stable housing with her boyfriend. States her boyfriend has been saving her disability income and they will use it to rent an apartment.  Patient reports that staying at the hospital for a long time is making her more and more depressed. States she needs to increase the dose on her Cymbalta . Patient reports often feeling anxious and helpless and requests "something to ease me down".  She reports no concerns about her medical conditions, that she has been taking medications as prescribed. Patient is educated about practicing safe sex. She is educated about substance abuse and the impact on her health and life in general. Patient  states "oh I am ready to start a new life". Reports her boyfriend is her primary support. She has had 4 kids but they all live their own lives. Only the youngest checks on her.   Patient does not appear to be in any acute distress and keeps expressing readiness for discharge.  No major depressive symptoms noted. She expresses anxiety related to "being here". She denies thoughts of self-harm. Denies Homicidal ideations. Denies hallucinations. She remained pleasant with sense of humor. She appears and sounds to be psychiatrically stable.     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) Active Problems:   Protein calorie malnutrition (HCC)   Cocaine abuse (HCC)   AIDS (acquired immune deficiency syndrome) (HCC)   Encounter for assessment of decision-making capacity   Protein-calorie malnutrition, severe    Plan   ## Psychiatric Medication  Recommendations:  Increase Cymbalta  to 60 mg PO Daily Start Hydroxyzine  25 mg PO TID PRN  ## Medical Decision Making Capacity: Not specifically addressed in this encounter  ## Further Work-up:  -- NA  -- most recent EKG: NA -- Pertinent labwork reviewed earlier this admission includes: All labs reviewed   ## Disposition:-- There are no psychiatric contraindications to discharge at this time  ## Behavioral / Environmental: - No specific recommendations at this time.     ## Safety and Observation Level:  - Based on my clinical evaluation, I estimate the patient to be at Low risk of self harm in the current setting. - At this time, we recommend  routine. This decision is based on my review of the chart including patient's history and current presentation, interview of the patient, mental status examination, and consideration of suicide risk including evaluating suicidal ideation, plan, intent, suicidal or self-harm behaviors, risk factors, and protective factors. This judgment is based on our ability to directly address suicide risk,  implement suicide prevention strategies, and develop a safety plan while the patient is in the clinical setting. Please contact our team if there is a concern that risk level has changed.  CSSR Risk Category:C-SSRS RISK CATEGORY: No Risk  Suicide Risk Assessment: Patient has following modifiable risk factors for suicide: lack of access to outpatient mental health resources, which we are addressing by recommending  psychiatric/mental health services upon discharge. Patient has following non-modifiable or demographic risk factors for suicide: psychiatric hospitalization Patient has the following protective factors against suicide: Supportive friends and Cultural, spiritual, or religious beliefs that discourage suicide  Thank you for this consult request. Recommendations have been communicated to the primary team.  Patient is psychiatrically cleared at this time.    Elston Halsted, NP       History of Present Illness  Relevant Aspects of [facility] Course:  Admitted on 12/08/2023 .   Patient Report:  "I've got a life, God has given me another chance"  Psych ROS:  Depression: reports  Anxiety:  Reports Mania (lifetime and current): NA Psychosis: (lifetime and current): Hx  Collateral information:  Contacted : NA  Review of Systems  Constitutional: Negative.   HENT: Negative.    Eyes: Negative.   Respiratory: Negative.    Cardiovascular: Negative.   Gastrointestinal: Negative.   Genitourinary: Negative.   Musculoskeletal: Negative.   Skin: Negative.   Neurological: Negative.   Endo/Heme/Allergies: Negative.   Psychiatric/Behavioral:  Positive for depression and substance abuse. The patient is nervous/anxious.      Psychiatric and Social History  Psychiatric History:  Information collected from Patient/chart  Prev Dx/Sx: Substance abuse, Depression, Anxiety Current Psych Provider: NA Home Meds (current): Cymbalta  Previous Med Trials: Uknown Therapy: NA  Prior Psych Hospitalization: Reports  Prior Self Harm: Reports Prior Violence: Denies  Family Psych History: NA Family Hx suicide: NA  Social History:  Developmental Hx: NA Educational Hx: NA Occupational Hx: NA Legal Hx: NA Living Situation: Homeless Spiritual Hx: NA Access to weapons/lethal means: NA   Substance History Alcohol: Reports   Type of alcohol Beer Last Drink prior to admission Number of drinks per day 2-3 History of alcohol withdrawal seizures NA History of DT's NA Tobacco: NA Illicit drugs: NA Prescription drug abuse: NA Rehab hx: NA  Exam Findings  Physical Exam:  Vital Signs:  Temp:  [98.1 F (36.7 C)-98.6 F (37 C)] 98.6 F (37 C) (04/25 0818) Pulse Rate:  [58-94] 58 (04/25 0818) Resp:  [18-20] 18 (04/25 0818) BP: (118-131)/(79-90) 123/90 (04/25 0818) SpO2:  [91 %-99 %] 91 % (04/25 0818) Weight:  [89.1 kg] 89.1 kg  (04/25 0500) Blood pressure (!) 123/90, pulse (!) 58, temperature 98.6 F (37 C), temperature source Oral, resp. rate 18, height 5' 7.99" (1.727 m), weight 89.1 kg, SpO2 91%. Body mass index is 29.87 kg/m.  Physical Exam Vitals and nursing note reviewed.  HENT:     Head: Normocephalic and atraumatic.     Right Ear: Tympanic membrane normal.     Left Ear: Tympanic membrane normal.     Nose: Nose normal.     Mouth/Throat:     Mouth: Mucous membranes are moist.  Eyes:     Extraocular Movements: Extraocular movements intact.     Pupils: Pupils are equal, round, and reactive to light.  Pulmonary:     Effort: Pulmonary effort is normal.  Musculoskeletal:        General: Normal range of motion.     Cervical back: Normal range of  motion and neck supple.  Neurological:     General: No focal deficit present.     Mental Status: She is alert and oriented to person, place, and time.     Mental Status Exam: General Appearance: Casual  Orientation:  Full (Time, Place, and Person)  Memory:  Immediate;   Fair Recent;   Fair Remote;   Fair  Concentration:  Concentration: Fair and Attention Span: Fair  Recall:  Fair  Attention  Fair  Eye Contact:  Fair  Speech:  Clear and Coherent  Language:  Fair  Volume:  Normal  Mood: Anxious  Affect:  Congruent  Thought Process:  Coherent  Thought Content:  WDL  Suicidal Thoughts:  No  Homicidal Thoughts:  No  Judgement:  Fair  Insight:  Fair  Psychomotor Activity:  NA  Akathisia:  NA  Fund of Knowledge:  Fair      Assets:  Manufacturing systems engineer Desire for Improvement Intimacy  Cognition:  WNL  ADL's:  Intact  AIMS (if indicated):   NA     Other History   These have been pulled in through the EMR, reviewed, and updated if appropriate.  Family History:  The patient's Family history is unknown by patient.  Medical History: Past Medical History:  Diagnosis Date   Asthma    Depression    HIV (human immunodeficiency virus infection)  (HCC)     Surgical History: Past Surgical History:  Procedure Laterality Date   ABDOMINAL HYSTERECTOMY     APPENDECTOMY       Medications:   Current Facility-Administered Medications:    acetaminophen  (TYLENOL ) tablet 650 mg, 650 mg, Oral, Q6H PRN, Sheril Dines, MD, 650 mg at 04/03/24 1812   antiseptic oral rinse (BIOTENE) solution 15 mL, 15 mL, Mouth Rinse, QPC supper, Althia Atlas, MD   atovaquone  (MEPRON ) 750 MG/5ML suspension 1,500 mg, 1,500 mg, Oral, Q breakfast, Ravishankar, Jayashree, MD, 1,500 mg at 04/03/24 0908   bictegravir-emtricitabine -tenofovir  AF (BIKTARVY ) 50-200-25 MG per tablet 1 tablet, 1 tablet, Oral, Daily, Ravishankar, Merrie Abed, MD, 1 tablet at 04/03/24 1239   chlorhexidine  (PERIDEX ) 0.12 % solution 15 mL, 15 mL, Mouth/Throat, BID, Elisabeth Guild, NP, 15 mL at 04/04/24 0548   cyclobenzaprine  (FLEXERIL ) tablet 5 mg, 5 mg, Oral, TID PRN, Verla Glaze, MD, 5 mg at 03/31/24 2203   dicyclomine  (BENTYL ) tablet 20 mg, 20 mg, Oral, TID PRN, Alexander, Natalie, DO, 20 mg at 03/02/24 2131   diphenhydrAMINE  (BENADRYL ) capsule 25 mg, 25 mg, Oral, Q8H PRN, Dezii, Alexandra, DO, 25 mg at 03/31/24 2202   DULoxetine  (CYMBALTA ) DR capsule 30 mg, 30 mg, Oral, QHS, Dezii, Alexandra, DO, 30 mg at 04/03/24 2238   enoxaparin  (LOVENOX ) injection 40 mg, 40 mg, Subcutaneous, QHS, Aleskerov, Fuad, MD, 40 mg at 04/03/24 2238   folic acid  (FOLVITE ) tablet 1 mg, 1 mg, Oral, Daily, Aleskerov, Fuad, MD, 1 mg at 04/03/24 0908   hydrocerin (EUCERIN) cream, , Topical, BID, Wieting, Richard, MD, 1 Application at 04/03/24 2242   hydrocortisone  (ANUSOL -HC) 2.5 % rectal cream, , Rectal, QID PRN, Melodi Sprung, DO, Given at 02/05/24 2106   ibuprofen  (ADVIL ) tablet 400 mg, 400 mg, Oral, Q6H PRN, Ayiku, Bernard, MD, 400 mg at 04/02/24 1308   ipratropium-albuterol  (DUONEB) 0.5-2.5 (3) MG/3ML nebulizer solution 3 mL, 3 mL, Nebulization, Q6H PRN, Wieting, Richard, MD   lactulose  (CHRONULAC ) 10  GM/15ML solution 20 g, 20 g, Oral, Daily PRN, Amin, Sumayya, MD   lidocaine  (LIDODERM ) 5 % 1 patch, 1 patch, Transdermal, Q24H, Wieting,  Richard, MD, 1 patch at 04/03/24 1812   lipase/protease/amylase (CREON ) capsule 24,000 Units, 24,000 Units, Oral, TID AC, Zhang, Dekui, MD, 24,000 Units at 04/03/24 1812   loperamide  (IMODIUM ) capsule 4 mg, 4 mg, Oral, PRN, Alexander, Natalie, DO, 4 mg at 02/17/24 1833   multivitamin with minerals tablet 1 tablet, 1 tablet, Oral, QHS, Zeigler, Dustin G, RPH, 1 tablet at 04/03/24 2238   nicotine  polacrilex (NICORETTE ) gum 2 mg, 2 mg, Oral, PRN, Elisabeth Guild, NP   ondansetron  (ZOFRAN -ODT) disintegrating tablet 4 mg, 4 mg, Oral, Q6H PRN, Djan, Prince T, MD, 4 mg at 03/09/24 1610   Oral care mouth rinse, 15 mL, Mouth Rinse, PRN, Lorita Rosa, MD   Oral care mouth rinse, 15 mL, Mouth Rinse, PRN, Elisabeth Guild, NP   pantoprazole  (PROTONIX ) EC tablet 40 mg, 40 mg, Oral, BID, Wieting, Richard, MD, 40 mg at 04/03/24 2238   polyethylene glycol (MIRALAX  / GLYCOLAX ) packet 17 g, 17 g, Oral, Daily PRN, Verla Glaze, MD, 17 g at 01/28/24 9604   senna-docusate (Senokot-S) tablet 2 tablet, 2 tablet, Oral, BID, Donaciano Frizzle, MD, 2 tablet at 04/03/24 2238   thiamine  (VITAMIN B1) tablet 100 mg, 100 mg, Oral, Daily, Aleskerov, Fuad, MD, 100 mg at 04/03/24 0908  Allergies: Allergies  Allergen Reactions   Fish Allergy Other (See Comments)    Crab legs result in itching  Crab legs result in itching  Crab legs result in itching   Shellfish Allergy Other (See Comments)    Crab legs result in itching   Buprenorphine Hcl     Pt states she not allergic   Morphine And Codeine Itching    hives   Sulfa  Antibiotics Rash    Zyrion Coey, NP

## 2024-04-04 NOTE — Progress Notes (Signed)
 Staff having concerns with patient being depressed and not wanting to be here anymore. She misses her boyfriend and family and doesn't talk to anyone. She is lonely. Questioning possibility of allowing boyfriend back for a couple hours a day to see how she does and also she still has tele sitter in room to monitor what they do.

## 2024-04-04 NOTE — Progress Notes (Signed)
 Date of Admission:  12/08/2023     ID: Meghan Jarvis is a 48 y.o. 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 Meghan Jarvis is a 48 y.o. with a history of AIDS, not compliant with meds or visits ,  , cocaine use was brought in by EMS after being found unresponsive on the couch  in an aquaintance place-   Subjective: Walking in the corridor with walker   Medications:   antiseptic oral rinse  15 mL Mouth Rinse QPC supper   atovaquone   1,500 mg Oral Q breakfast   bictegravir-emtricitabine -tenofovir  AF  1 tablet Oral Daily   chlorhexidine   15 mL Mouth/Throat BID   DULoxetine   60 mg Oral Daily   enoxaparin  (LOVENOX ) injection  40 mg Subcutaneous QHS   folic acid   1 mg Oral Daily   hydrocerin   Topical BID   lidocaine   1 patch Transdermal Q24H   lipase/protease/amylase  24,000 Units Oral TID AC   multivitamin with minerals  1 tablet Oral QHS   pantoprazole   40 mg Oral BID   senna-docusate  2 tablet Oral BID   thiamine   100 mg Oral Daily    Objective: Vital signs in last 24 hours: Patient Vitals for the past 24 hrs:  BP Temp Temp src Pulse Resp SpO2 Weight  04/04/24 1611 -- 98.2 F (36.8 C) -- 100 -- 97 % --  04/04/24 1611 110/78 -- -- 100 18 96 % --  04/04/24 0818 (!) 123/90 98.6 F (37 C) Oral (!) 58 18 91 % --  04/04/24 0500 -- -- -- -- -- -- 89.1 kg  04/04/24 0357 118/79 98.1 F (36.7 C) Oral 89 20 98 % --  04/03/24 2049 131/88 98.2 F (36.8 C) Oral 94 20 99 % --  ambulating Hss1s2 Lungs clear to auscultation CNS grossly non focal  Examination of [facility]- restricted and painful movt rt hip  Lab Results    Latest Ref Rng & Units 04/03/2024    4:40 AM 04/01/2024    6:11  AM 03/18/2024    5:03 AM  CBC  WBC 4.0 - 10.5 K/uL 4.4  4.6  5.4   Hemoglobin 12.0 - 15.0 g/dL 9.5  9.7  16.1   Hematocrit 36.0 - 46.0 % 30.1  30.4  34.0   Platelets 150 - 400 K/uL 295  298  272        Latest Ref Rng & Units 04/03/2024    4:40 AM 04/01/2024    6:11 AM 03/17/2024    5:25 AM  CMP  Glucose 70 - 99 mg/dL 096  045  92   BUN 6 - 20 mg/dL 14  12  12    Creatinine 0.44 - 1.00 mg/dL 4.09  8.11  9.14   Sodium 135 - 145 mmol/L 138  140  139   Potassium 3.5 - 5.1 mmol/L 3.8  3.8  3.7   Chloride 98 - 111 mmol/L 107  107  109   CO2 22 - 32 mmol/L 25  26  24    Calcium  8.9 - 10.3 mg/dL 9.2  9.3  8.8       Microbiology: 12/28 BC NG  Toxo IgG > 400 Toxo PCR neg Toxo igM neg  RPR NR CMV DNA neg Crypto neg HIV RNA 2 million>> 34, 000 Cd4 is 14 ( 2.4%) repeat on 1/29 is 416 ( 23%) Beta D glucan > 500 (  repeated) Histoplasma neg Fungal antibodies negative Genosure prime- no resistant mutations QuantiFERON gold indeterminate.  No treatment needed now.  Can repeat it later.  Radiology MRI hip rt done on 02/25/24 Reviewed personally     Intramuscular edema and enhancement within the right gluteus medius and minimus muscles with a central area of non-enhancement in the posterior right gluteus minimus muscle adjacent to the posterior acetabular rim. Similar findings are present at the lateral aspect of the right gluteus maximus muscle adjacent to the greater trochanter. 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 No fluid signal at these locations to suggest abscess formation.   Assessment/Plan:  AIDS- - not been in care since 2021-  Was on Biktarvy  at one time and was non compliant then VL  on admission was 2 million and cd4 is 11  Started Biktarvy  on 12/14/23 .  Genotype no resistance to NRTI, NNRTI, Integrase inhibitor and PI Repeat Vl showed a significant drop in 3 weeks from 2 million to 34,000 . Latest vl is 60 and CD4 is 416 ( 18.8%)  from 03/18/24 Continue atovaquone  for toxo and PCP prophylaxis  Significant weight gain 108>>189 pounds in 4 months- please consult nutritionist   Persistent Rt thigh, rt hip pain MRI shows myositis , myonecrosis of the gluteus medius , minimus Discussed with [facility] radiologist -moderate Osteoarthritis of rt hip.  Small muscle tear vs inflammation rt glutues  She was assesed by ortho and no intervention recommended   Encephalopathy  has resolved ( Likely related to cocaine use on presentation)  Aspiration leading to acute hypoxic respiratory failure with right middle lobe and lower lobe consolidation in Dec 2024 .  Status post intubation and was mechanically ventilated in the early part of the admission.  treated     Rash- resolved likely was due to bactrim - changed to Atovaquone - Mepron       Pruritus with excoriations and hyperpigmentiaon resolved ? Bactrim  related rash on baseline dry and excoriated skin    No evidence of IRIS   Toxo IgG very high-  MRI brain no CNS lesions  . TOXO PCR negative On  PCP/TOXO  and MAI prophylaxis. 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

## 2024-04-04 NOTE — Plan of Care (Addendum)
 Plan of care is reviewed. Pt is progressing, Stable hemodynamically, no distress noted overnight.  She has poor dental hygiene. We has reported that one of her tooth fell on 04/02/24.  Mouth care is encouraged. Pt refuses mouth care and brushing her teeth tonight. She agrees to brush her teeth at am. No major complaints. She is able to rest well. Pt has gained weight 40 kg since admission. She frequently requests snacks and sweet drinks. Limit high sugar intake and frequent snacking is encouraged. We will monitor.    Problem: Clinical Measurements: Goal: Ability to maintain clinical measurements within normal limits will improve Outcome: Progressing Goal: Will remain free from infection Outcome: Progressing Goal: Diagnostic test results will improve Outcome: Progressing Goal: Respiratory complications will improve Outcome: Progressing Goal: Cardiovascular complication will be avoided Outcome: Progressing   Problem: Health Behavior/Discharge Planning: Goal: Ability to manage health-related needs will improve Outcome: Progressing   Problem: Nutrition: Goal: Adequate nutrition will be maintained Outcome: Progressing   Problem: Activity: Goal: Risk for activity intolerance will decrease Outcome: Progressing   Problem: Elimination: Goal: Will not experience complications related to bowel motility Outcome: Progressing Goal: Will not experience complications related to urinary retention Outcome: Progressing   Problem: Pain Management: Goal: General experience of comfort will improve Outcome: Progressing   Problem: Coping: Goal: Ability to adjust to condition or change in health will improve Outcome: Progressing  Brain Cahill, RN

## 2024-04-05 DIAGNOSIS — B20 Human immunodeficiency virus [HIV] disease: Secondary | ICD-10-CM | POA: Diagnosis not present

## 2024-04-05 DIAGNOSIS — F02818 Dementia in other diseases classified elsewhere, unspecified severity, with other behavioral disturbance: Secondary | ICD-10-CM | POA: Diagnosis not present

## 2024-04-05 NOTE — Plan of Care (Signed)
  Problem: Coping: Goal: Ability to adjust to condition or change in health will improve Outcome: Progressing   Problem: Fluid Volume: Goal: Ability to maintain a balanced intake and output will improve Outcome: Progressing   Problem: Health Behavior/Discharge Planning: Goal: Ability to identify and utilize available resources and services will improve Outcome: Progressing Goal: Ability to manage health-related needs will improve Outcome: Progressing   Problem: Metabolic: Goal: Ability to maintain appropriate glucose levels will improve Outcome: Progressing   Problem: Nutritional: Goal: Maintenance of adequate nutrition will improve Outcome: Progressing Goal: Progress toward achieving an optimal weight will improve Outcome: Progressing   Problem: Skin Integrity: Goal: Risk for impaired skin integrity will decrease Outcome: Progressing   Problem: Tissue Perfusion: Goal: Adequacy of tissue perfusion will improve Outcome: Progressing   Problem: Education: Goal: Knowledge of General Education information will improve Description: Including pain rating scale, medication(s)/side effects and non-pharmacologic comfort measures Outcome: Progressing   Problem: Health Behavior/Discharge Planning: Goal: Ability to manage health-related needs will improve Outcome: Progressing   Problem: Clinical Measurements: Goal: Ability to maintain clinical measurements within normal limits will improve Outcome: Progressing Goal: Will remain free from infection Outcome: Progressing Goal: Diagnostic test results will improve Outcome: Progressing Goal: Respiratory complications will improve Outcome: Progressing Goal: Cardiovascular complication will be avoided Outcome: Progressing   Problem: Activity: Goal: Risk for activity intolerance will decrease Outcome: Progressing   Problem: Nutrition: Goal: Adequate nutrition will be maintained Outcome: Progressing   Problem: Coping: Goal:  Level of anxiety will decrease Outcome: Progressing   Problem: Elimination: Goal: Will not experience complications related to bowel motility Outcome: Progressing Goal: Will not experience complications related to urinary retention Outcome: Progressing   Problem: Pain Management: Goal: General experience of comfort will improve Outcome: Progressing   Problem: Safety: Goal: Ability to remain free from injury will improve Outcome: Progressing   Problem: Skin Integrity: Goal: Risk for impaired skin integrity will decrease Outcome: Progressing   Problem: Activity: Goal: Ability to tolerate increased activity will improve Outcome: Progressing   Problem: Respiratory: Goal: Ability to maintain a clear airway and adequate ventilation will improve Outcome: Progressing

## 2024-04-05 NOTE — Progress Notes (Signed)
 PROGRESS NOTE   Meghan Jarvis  NFA:[id number] DOB: 10-Apr-1976 DOA: 12/08/2023 [name]    HPI was taken from NP [name]: 48 y.o with significant PMH of EtOH abuse ,Cocaine use, Homelessness, Uncontrolled HIV, on BIKTARVY , A-fib, GERD, Hypothyroidism, Diabetes, who presented to the ED with chief complaints of unresponsiveness.   Per ED reports, EMS report that patient was boarding at a friend's house who noticed that she had not woken up the whole day. Patient's friend attempted to wake her up but noticed that she was unresponsive and making "gurgling" sound so they called EMS. On EMS arrival, patient was found to be unconscious and incontinent of urine and feces.     ED Course: Initial vital signs showed HR of 118 beats/minute, BP 90/60 mm Hg, the RR 25 breaths/minute, and the oxygen saturation 98% on NRB and a temperature of 100.45F. Patient was obtunded with no gag reflex with debris around the mouth and nose concerning for stomach contents. She was emergently intubated for airway protection. Pertinent Labs/Diagnostics Findings: Na+/ K+:140/2.6  Glucose: 117 BUN/Cr.:33/1.18 AST/ALT:45/17 WBC:9.6 K/L PCT: 2.2 Lactic acid: 1.1 COVID PCR: Negative,  ABG: pO2 139; pCO2 27; pH 7.51;  HCO3 21.5, %O2 Sat 100.  CXR> see full report  Medication administered in the ED: Patient given 30 cc/kg of fluids and started on broad-spectrum antibiotics Ceftriaxone  and Azithromycin  for suspected sepsis Disposition:ICU   As per Dr. Broadus Canes 4/16-4/22/25: Pt has remained medically stable. Still awaiting guardianship hearing date. 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 Poor historian and does not have capacity to make decisions as per psych. Still waiting on guardianship hearing date. Continue on biktarvy . Continue on atovaquone  for PCP ppx  HIV associated neurocognitive disorder: MRI did not show any enhancing lesions but did show severe cerebral atrophy likely secondary to HIV/AIDS.    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 Continue w/ conservative management   Diarrhea: Resolved    Hypotension: resolved. Holding midodrine    Poor dental hygiene, one left maxillary tooth fell off on 4/23 Started oral rinse nightly for 2 weeks  Depression due to prolonged stay in the hospital and unable to see significant other. Psych consulted for further recommendation.   Overweight, BMI 30 Significant weight gain during hospital stay Nutritionist consulted  Social issues: homeless and has polysubstance use disorder (alcohol, cocaine).  Pt's boyfriend/fiance' is not able to visit the pt anymore b/c concern of him bringing the pt alcohol and/or illicit drugs. Still awaiting guardianship. No safe d/c plan      DVT prophylaxis: lovenox  Code Status: full  Family Communication:  Disposition Plan: awaiting guardianship still. Unsafe d/c plan   Level of care: Med-Surg Consultants:  Psych  Ortho surg  ID  Procedures:  Antimicrobials:   Subjective: No significant events overnight, patient was laying comfortably in the bed, denied any complaints.  Patient is aware that she is awaiting for guardianship,   Objective: Vitals:   04/04/24 1611 04/04/24 2123 04/05/24 0407 04/05/24 0816  BP:  118/87 119/80 128/89  Pulse: 100 99 96 87  Resp:  20 20 16  Temp: 98.2 F (36.8 C) 97.9 F (36.6 C) 97.9 F (36.6 C) 98.4 F (36.9 C)  TempSrc:      SpO2: 97% 100% 100% 100%  Weight:   89.9 kg   Height:        Intake/Output Summary (Last 24 hours) at 04/05/2024 1631 Last data filed at  04/05/2024 1546 Gross per 24 hour  Intake 120 ml  Output --  Net 120 ml   Filed Weights   04/03/24 0500 04/04/24 0500 04/05/24 0407  Weight: 88.9 kg 89.1 kg 89.9 kg    Examination:  General exam: Appears calm & comfortable  Respiratory system: clear breath sounds b/l  Cardiovascular system: S1 & S2+. No rubs or gallops   Gastrointestinal system: abd is soft, NT, ND & hypoactive bowel sounds  Central nervous system: alert & awake. Moves all extremities  Psychiatry: judgement and insight appears poor. Flat mood and affect    Data Reviewed: I have personally reviewed following labs and imaging studies  CBC: Recent Labs  Lab 04/01/24 0611 04/03/24 0440  WBC 4.6 4.4  HGB 9.7* 9.5*  HCT 30.4* 30.1*  MCV 84.7 84.6  PLT 298 295   Basic Metabolic Panel: Recent Labs  Lab 04/01/24 0611 04/03/24 0440  NA 140 138  K 3.8 3.8  CL 107 107  CO2 26 25  GLUCOSE 115* 116*  BUN 12 14  CREATININE 0.72 0.79  CALCIUM  9.3 9.2  MG  --  1.8  PHOS  --  4.7*   GFR: Estimated Creatinine Clearance: 99.8 mL/min (by C-G formula based on SCr of 0.79 mg/dL). Liver Function Tests: No results for input(s): "AST", "ALT", "ALKPHOS", "BILITOT", "PROT", "ALBUMIN" in the last 168 hours. No results for input(s): "LIPASE", "AMYLASE" in the last 168 hours. No results for input(s): "AMMONIA" in the last 168 hours. Coagulation Profile: No results for input(s): "INR", "PROTIME" in the last 168 hours. Cardiac Enzymes: No results for input(s): "CKTOTAL", "CKMB", "CKMBINDEX", "TROPONINI" in the last 168 hours. BNP (last 3 results) No results for input(s): "PROBNP" in the last 8760 hours. HbA1C: No results for input(s): "HGBA1C" in the last 72 hours. CBG: No results for input(s): "GLUCAP" in the last 168 hours. Lipid Profile: No results for input(s): "CHOL", "HDL", "LDLCALC", "TRIG", "CHOLHDL", "LDLDIRECT" in the last 72 hours. Thyroid Function Tests: No results for input(s): "TSH", "T4TOTAL",  "FREET4", "T3FREE", "THYROIDAB" in the last 72 hours. Anemia Panel: No results for input(s): "VITAMINB12", "FOLATE", "FERRITIN", "TIBC", "IRON", "RETICCTPCT" in the last 72 hours. Sepsis Labs: No results for input(s): "PROCALCITON", "LATICACIDVEN" in the last 168 hours.  No results found for this or any previous visit (from the past 240 hours).    Radiology Studies: No results found.   Scheduled Meds:  antiseptic oral rinse  15 mL Mouth Rinse QPC supper   atovaquone   1,500 mg Oral Q breakfast   bictegravir-emtricitabine -tenofovir  AF  1 tablet Oral Daily   chlorhexidine   15 mL Mouth/Throat BID   DULoxetine   60 mg Oral Daily   enoxaparin  (LOVENOX ) injection  40 mg Subcutaneous QHS   folic acid   1 mg Oral Daily   hydrocerin   Topical BID   lidocaine   1 patch Transdermal Q24H   lipase/protease/amylase  24,000 Units Oral TID AC   multivitamin with minerals  1 tablet Oral QHS   pantoprazole   40 mg Oral BID   senna-docusate  2 tablet Oral BID   thiamine   100 mg Oral Daily   Continuous Infusions:   LOS: 119  days     Althia Atlas, MD [facility] Pager [phone]  If 7PM-7AM, please contact night-coverage [email or web address] 04/05/2024, 4:31 PM

## 2024-04-05 NOTE — Plan of Care (Signed)
  Problem: Coping: Goal: Ability to adjust to condition or change in health will improve Outcome: Progressing   Problem: Fluid Volume: Goal: Ability to maintain a balanced intake and output will improve Outcome: Progressing   Problem: Health Behavior/Discharge Planning: Goal: Ability to identify and utilize available resources and services will improve Outcome: Progressing Goal: Ability to manage health-related needs will improve Outcome: Progressing   Problem: Metabolic: Goal: Ability to maintain appropriate glucose levels will improve Outcome: Progressing   Problem: Nutritional: Goal: Maintenance of adequate nutrition will improve Outcome: Progressing Goal: Progress toward achieving an optimal weight will improve Outcome: Progressing   Problem: Skin Integrity: Goal: Risk for impaired skin integrity will decrease Outcome: Progressing   Problem: Tissue Perfusion: Goal: Adequacy of tissue perfusion will improve Outcome: Progressing   Problem: Health Behavior/Discharge Planning: Goal: Ability to manage health-related needs will improve Outcome: Progressing   Problem: Education: Goal: Knowledge of General Education information will improve Description: Including pain rating scale, medication(s)/side effects and non-pharmacologic comfort measures Outcome: Progressing   Problem: Clinical Measurements: Goal: Ability to maintain clinical measurements within normal limits will improve Outcome: Progressing Goal: Will remain free from infection Outcome: Progressing Goal: Diagnostic test results will improve Outcome: Progressing Goal: Respiratory complications will improve Outcome: Progressing Goal: Cardiovascular complication will be avoided Outcome: Progressing   Problem: Activity: Goal: Risk for activity intolerance will decrease Outcome: Progressing   Problem: Nutrition: Goal: Adequate nutrition will be maintained Outcome: Progressing   Problem: Coping: Goal:  Level of anxiety will decrease Outcome: Progressing

## 2024-04-05 NOTE — Plan of Care (Signed)
  Problem: Coping: Goal: Ability to adjust to condition or change in health will improve Outcome: Progressing   Problem: Fluid Volume: Goal: Ability to maintain a balanced intake and output will improve Outcome: Progressing   Problem: Health Behavior/Discharge Planning: Goal: Ability to identify and utilize available resources and services will improve Outcome: Progressing Goal: Ability to manage health-related needs will improve Outcome: Progressing   Problem: Metabolic: Goal: Ability to maintain appropriate glucose levels will improve Outcome: Progressing   Problem: Nutritional: Goal: Maintenance of adequate nutrition will improve Outcome: Progressing Goal: Progress toward achieving an optimal weight will improve Outcome: Progressing   Problem: Skin Integrity: Goal: Risk for impaired skin integrity will decrease Outcome: Progressing   Problem: Tissue Perfusion: Goal: Adequacy of tissue perfusion will improve Outcome: Progressing   Problem: Education: Goal: Knowledge of General Education information will improve Description: Including pain rating scale, medication(s)/side effects and non-pharmacologic comfort measures Outcome: Progressing   Problem: Health Behavior/Discharge Planning: Goal: Ability to manage health-related needs will improve Outcome: Progressing   Problem: Clinical Measurements: Goal: Ability to maintain clinical measurements within normal limits will improve Outcome: Progressing Goal: Will remain free from infection Outcome: Progressing Goal: Diagnostic test results will improve Outcome: Progressing Goal: Respiratory complications will improve Outcome: Progressing Goal: Cardiovascular complication will be avoided Outcome: Progressing   Problem: Activity: Goal: Risk for activity intolerance will decrease Outcome: Progressing   Problem: Nutrition: Goal: Adequate nutrition will be maintained Outcome: Progressing   Problem: Coping: Goal:  Level of anxiety will decrease Outcome: Progressing   Problem: Pain Management: Goal: General experience of comfort will improve Outcome: Progressing

## 2024-04-06 DIAGNOSIS — F02818 Dementia in other diseases classified elsewhere, unspecified severity, with other behavioral disturbance: Secondary | ICD-10-CM | POA: Diagnosis not present

## 2024-04-06 DIAGNOSIS — B20 Human immunodeficiency virus [HIV] disease: Secondary | ICD-10-CM | POA: Diagnosis not present

## 2024-04-06 NOTE — Plan of Care (Signed)
  Problem: Coping: Goal: Ability to adjust to condition or change in health will improve Outcome: Progressing   Problem: Fluid Volume: Goal: Ability to maintain a balanced intake and output will improve Outcome: Progressing   Problem: Health Behavior/Discharge Planning: Goal: Ability to identify and utilize available resources and services will improve Outcome: Progressing Goal: Ability to manage health-related needs will improve Outcome: Progressing   Problem: Metabolic: Goal: Ability to maintain appropriate glucose levels will improve Outcome: Progressing   Problem: Nutritional: Goal: Maintenance of adequate nutrition will improve Outcome: Progressing Goal: Progress toward achieving an optimal weight will improve Outcome: Progressing   Problem: Skin Integrity: Goal: Risk for impaired skin integrity will decrease Outcome: Progressing   Problem: Education: Goal: Knowledge of General Education information will improve Description: Including pain rating scale, medication(s)/side effects and non-pharmacologic comfort measures Outcome: Progressing   Problem: Health Behavior/Discharge Planning: Goal: Ability to manage health-related needs will improve Outcome: Progressing   Problem: Tissue Perfusion: Goal: Adequacy of tissue perfusion will improve Outcome: Progressing   Problem: Clinical Measurements: Goal: Ability to maintain clinical measurements within normal limits will improve Outcome: Progressing Goal: Will remain free from infection Outcome: Progressing Goal: Diagnostic test results will improve Outcome: Progressing Goal: Respiratory complications will improve Outcome: Progressing Goal: Cardiovascular complication will be avoided Outcome: Progressing   Problem: Activity: Goal: Risk for activity intolerance will decrease Outcome: Progressing   Problem: Nutrition: Goal: Adequate nutrition will be maintained Outcome: Progressing   Problem: Coping: Goal:  Level of anxiety will decrease Outcome: Progressing   Problem: Elimination: Goal: Will not experience complications related to bowel motility Outcome: Progressing Goal: Will not experience complications related to urinary retention Outcome: Progressing

## 2024-04-06 NOTE — Progress Notes (Signed)
 Per MD Hubert Madden, Patient's friend can visit with security checks. Patient made aware.  Meghan Jarvis

## 2024-04-06 NOTE — Progress Notes (Signed)
 PROGRESS NOTE   Meghan Jarvis  WUJ:[id number] DOB: Jan 21, 1976 DOA: 12/08/2023 [facility]: [name]    HPI was taken from NP [name]: 48 y.o with significant PMH of EtOH abuse ,Cocaine use, Homelessness, Uncontrolled HIV, on BIKTARVY , A-fib, GERD, Hypothyroidism, Diabetes, who presented to the ED with chief complaints of unresponsiveness.   Per ED reports, EMS report that patient was boarding at a friend's house who noticed that she had not woken up the whole day. Patient's friend attempted to wake her up but noticed that she was unresponsive and making "gurgling" sound so they called EMS. On EMS arrival, patient was found to be unconscious and incontinent of urine and feces.     ED Course: Initial vital signs showed HR of 118 beats/minute, BP 90/60 mm Hg, the RR 25 breaths/minute, and the oxygen saturation 98% on NRB and a temperature of 100.20F. Patient was obtunded with no gag reflex with debris around the mouth and nose concerning for stomach contents. She was emergently intubated for airway protection. Pertinent Labs/Diagnostics Findings: Na+/ K+:140/2.6  Glucose: 117 BUN/Cr.:33/1.18 AST/ALT:45/17 WBC:9.6 K/L PCT: 2.2 Lactic acid: 1.1 COVID PCR: Negative,  ABG: pO2 139; pCO2 27; pH 7.51;  HCO3 21.5, %O2 Sat 100.  CXR> see full report  Medication administered in the ED: Patient given 30 cc/kg of fluids and started on broad-spectrum antibiotics Ceftriaxone  and Azithromycin  for suspected sepsis Disposition:ICU   As per Dr. Broadus Canes 4/16-4/22/25: Pt has remained medically stable. Still awaiting guardianship hearing date. 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 Poor historian and does not have capacity to make decisions as per psych. Still waiting on guardianship hearing date. Continue on biktarvy . Continue on atovaquone  for PCP ppx  HIV associated neurocognitive disorder: MRI did not show any enhancing lesions but did show severe cerebral atrophy likely secondary to HIV/AIDS.    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 Continue w/ conservative management   Diarrhea: Resolved    Hypotension: resolved. Holding midodrine    Poor dental hygiene, one left maxillary tooth fell off on 4/23 Started oral rinse nightly for 2 weeks  Depression due to prolonged stay in the hospital and unable to see significant other. Psych consulted for further recommendation.   Overweight, BMI 30 Significant weight gain during hospital stay Nutritionist consulted  Social issues: homeless and has polysubstance use disorder (alcohol, cocaine).  Pt's boyfriend/fiance' is not able to visit the pt anymore b/c concern of him bringing the pt alcohol and/or illicit drugs. Still awaiting guardianship. No safe d/c plan      DVT prophylaxis: lovenox  Code Status: full  Family Communication:  Disposition Plan: awaiting guardianship still. Unsafe d/c plan   Level of care: Med-Surg Consultants:  Psych  Ortho surg  ID  Procedures:  Antimicrobials:   Subjective: No significant events overnight, patient was sitting on the bed comfortably, denied any complaints.  She would like to see her boyfriend, permission was given after security check so he should not bring any drugs or alcohol from outside. RN was advised to make sure and then he can be allowed.    Objective: Vitals:  04/04/24 2123 04/05/24 0407 04/05/24 0816 04/06/24 0946  BP: 118/87 119/80 128/89 113/75  Pulse: 99 96 87 (!) 105  Resp: 20 20 16 18   Temp: 97.9 F (36.6 C) 97.9 F (36.6 C) 98.4 F (36.9 C) 98.2 F (36.8 C)   TempSrc:      SpO2: 100% 100% 100% 98%  Weight:  89.9 kg    Height:        Intake/Output Summary (Last 24 hours) at 04/06/2024 1444 Last data filed at 04/06/2024 1300 Gross per 24 hour  Intake 360 ml  Output --  Net 360 ml   Filed Weights   04/03/24 0500 04/04/24 0500 04/05/24 0407  Weight: 88.9 kg 89.1 kg 89.9 kg    Examination:  General exam: Appears calm & comfortable  Respiratory system: clear breath sounds b/l  Cardiovascular system: S1 & S2+. No rubs or gallops   Gastrointestinal system: abd is soft, NT, ND & hypoactive bowel sounds  Central nervous system: alert & awake. Moves all extremities  Psychiatry: judgement and insight appears poor. Flat mood and affect    Data Reviewed: I have personally reviewed following labs and imaging studies  CBC: Recent Labs  Lab 04/01/24 0611 04/03/24 0440  WBC 4.6 4.4  HGB 9.7* 9.5*  HCT 30.4* 30.1*  MCV 84.7 84.6  PLT 298 295   Basic Metabolic Panel: Recent Labs  Lab 04/01/24 0611 04/03/24 0440  NA 140 138  K 3.8 3.8  CL 107 107  CO2 26 25  GLUCOSE 115* 116*  BUN 12 14  CREATININE 0.72 0.79  CALCIUM  9.3 9.2  MG  --  1.8  PHOS  --  4.7*   GFR: Estimated Creatinine Clearance: 99.8 mL/min (by C-G formula based on SCr of 0.79 mg/dL). Liver Function Tests: No results for input(s): "AST", "ALT", "ALKPHOS", "BILITOT", "PROT", "ALBUMIN" in the last 168 hours. No results for input(s): "LIPASE", "AMYLASE" in the last 168 hours. No results for input(s): "AMMONIA" in the last 168 hours. Coagulation Profile: No results for input(s): "INR", "PROTIME" in the last 168 hours. Cardiac Enzymes: No results for input(s): "CKTOTAL", "CKMB", "CKMBINDEX", "TROPONINI" in the last 168 hours. BNP (last 3 results) No results for input(s): "PROBNP" in the last 8760 hours. HbA1C: No results for input(s): "HGBA1C" in the last 72 hours. CBG: No results for input(s): "GLUCAP" in the last 168 hours. Lipid Profile: No results for  input(s): "CHOL", "HDL", "LDLCALC", "TRIG", "CHOLHDL", "LDLDIRECT" in the last 72 hours. Thyroid Function Tests: No results for input(s): "TSH", "T4TOTAL", "FREET4", "T3FREE", "THYROIDAB" in the last 72 hours. Anemia Panel: No results for input(s): "VITAMINB12", "FOLATE", "FERRITIN", "TIBC", "IRON", "RETICCTPCT" in the last 72 hours. Sepsis Labs: No results for input(s): "PROCALCITON", "LATICACIDVEN" in the last 168 hours.  No results found for this or any previous visit (from the past 240 hours).    Radiology Studies: No results found.   Scheduled Meds:  antiseptic oral rinse  15 mL Mouth Rinse QPC supper   atovaquone   1,500 mg Oral Q breakfast   bictegravir-emtricitabine -tenofovir  AF  1 tablet Oral Daily   chlorhexidine   15 mL Mouth/Throat BID   DULoxetine   60 mg Oral Daily   enoxaparin  (LOVENOX ) injection  40 mg Subcutaneous QHS   folic acid   1 mg Oral Daily   hydrocerin   Topical BID   lidocaine   1 patch Transdermal Q24H   lipase/protease/amylase  24,000 Units Oral TID AC   multivitamin with minerals  1 tablet Oral QHS   pantoprazole   40 mg Oral BID   senna-docusate  2 tablet Oral BID   thiamine   100 mg Oral Daily   Continuous Infusions:   LOS: 120 days     Althia Atlas, MD [facility] Pager [phone]  If 7PM-7AM, please contact night-coverage [email or web address] 04/06/2024, 2:44 PM

## 2024-04-06 NOTE — Plan of Care (Signed)
  Problem: Coping: Goal: Ability to adjust to condition or change in health will improve Outcome: Progressing   Problem: Fluid Volume: Goal: Ability to maintain a balanced intake and output will improve Outcome: Progressing   Problem: Health Behavior/Discharge Planning: Goal: Ability to identify and utilize available resources and services will improve Outcome: Progressing Goal: Ability to manage health-related needs will improve Outcome: Progressing   Problem: Metabolic: Goal: Ability to maintain appropriate glucose levels will improve Outcome: Progressing   Problem: Nutritional: Goal: Maintenance of adequate nutrition will improve Outcome: Progressing Goal: Progress toward achieving an optimal weight will improve Outcome: Progressing   Problem: Skin Integrity: Goal: Risk for impaired skin integrity will decrease Outcome: Progressing   Problem: Tissue Perfusion: Goal: Adequacy of tissue perfusion will improve Outcome: Progressing   Problem: Education: Goal: Knowledge of General Education information will improve Description: Including pain rating scale, medication(s)/side effects and non-pharmacologic comfort measures Outcome: Progressing   Problem: Health Behavior/Discharge Planning: Goal: Ability to manage health-related needs will improve Outcome: Progressing   Problem: Clinical Measurements: Goal: Ability to maintain clinical measurements within normal limits will improve Outcome: Progressing Goal: Will remain free from infection Outcome: Progressing Goal: Diagnostic test results will improve Outcome: Progressing Goal: Respiratory complications will improve Outcome: Progressing Goal: Cardiovascular complication will be avoided Outcome: Progressing   Problem: Activity: Goal: Risk for activity intolerance will decrease Outcome: Progressing   Problem: Nutrition: Goal: Adequate nutrition will be maintained Outcome: Progressing   Problem: Coping: Goal:  Level of anxiety will decrease Outcome: Progressing   Problem: Elimination: Goal: Will not experience complications related to bowel motility Outcome: Progressing Goal: Will not experience complications related to urinary retention Outcome: Progressing   Problem: Pain Management: Goal: General experience of comfort will improve Outcome: Progressing   Problem: Safety: Goal: Ability to remain free from injury will improve Outcome: Progressing   Problem: Skin Integrity: Goal: Risk for impaired skin integrity will decrease Outcome: Progressing   Problem: Activity: Goal: Ability to tolerate increased activity will improve Outcome: Progressing   Problem: Respiratory: Goal: Ability to maintain a clear airway and adequate ventilation will improve Outcome: Progressing

## 2024-04-06 NOTE — Consult Note (Signed)
 [facility] Psychiatric Consult Follow up  Patient Name: .Meghan Jarvis  MRN: [id number]  DOB: 05-May-1976  Consult Order details:  Orders (From admission, onward)     Start     Ordered   12/24/23 0739  IP CONSULT TO PSYCHIATRY       Ordering Provider: Dezii, Alexandra, DO  Provider:  (Not yet assigned)  Question Answer Comment  Location [facility]   Reason for Consult? capacity assessment      12/24/23 0738             Mode of Visit: In person, I spent 45 min on this consult.     Psychiatry Consult Evaluation  Service Date: April 06, 2024 LOS:  LOS: 120 days  Chief Complaint "I don't know what I am going to do after I leave the hospital".   Primary Psychiatric Diagnoses  Major neurocognitive disorder Adjustment disorder with depressed mood  Assessment  Meghan Jarvis is a 48 y.o. female admitted: Medicallyfor 12/08/2023  9:57 PM for abdominal pain. She carries the psychiatric diagnoses of substance use and has a past medical history of HIV, acute metabolic encephalopathy, pyelonephritis, hypertension, migraine, atrial fibrillation.  Patient's cognitive status has declined since January 2025 to date, when the Last assessment showed patient lacks capacity.    04/06/24: Patient denies SI/HI/plan, denies hallucinations.  She is responding to treatment very well and reported no side effects to the current medications. 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  The [place] cognitive assessment was conducted with the score 13 out of 30 on 01/28/24  ## Further Work-up:  -- No further workup recommended   ## Disposition:-- There are no psychiatric contraindications to discharge at this time.   ## Behavioral / Environmental: - No specific recommendations at this time.    Thank you for this consult request. Recommendations have been communicated to the primary team.  We will sign off at this time.   Meghan Packham, MD       History of Present Illness   48 year old female requested for capacity screening by the attending, patient lacks insight into her medical and mental health problems and continues to say that she needs to get out of the hospital and she wants to go home.  She was oriented to month and the year but not to the president.  . 04/06/24: Patient is noted to be sitting up and having her lunch.  Patient reports that she is taking her increased dose of Cymbalta  and hydroxyzine  as needed basis.  Patient did acknowledge that DSS is working on getting the legal guardianship so that they can assist in placement.  She denies SI/HI/plan.  She denies auditory/visual hallucinations.  She has no other complaints or concerns.  Psychiatric and Social History  Psychiatric History:  Information collected from Pt  Prev Dx/Sx: Substance use Current Psych Provider: Denies Home Meds (current): See Attending notes Family Psych History: Denies Family Hx suicide:  Denies  Social History:   Living Situation: Homeless Spiritual Hx: Denies Access to weapons/lethal means: Denies   Substance History Alcohol: Endorses, 3 or more drinks a day  Tobacco: Denies Illicit drugs: Cocaine   Exam Findings   Vital Signs:  Temp:  [98.2 F (36.8 C)] 98.2 F (36.8 C) (04/27 0946) Pulse Rate:  [105] 105 (04/27 0946) Resp:  [18] 18 (04/27  0946) BP: (113)/(75) 113/75 (04/27 0946) SpO2:  [98 %] 98 % (04/27 0946) Blood pressure 113/75, pulse (!) 105, temperature 98.2 F (36.8 C), resp. rate 18, height 5' 7.99" (1.727 m), weight 89.9 kg, SpO2 98%. Body mass index is 30.14 kg/m.  Physical Exam HENT:     Head: Normocephalic.     Mouth/Throat:     Mouth: Mucous membranes are moist.  Cardiovascular:     Rate and Rhythm: Normal rate.  Pulmonary:     Effort: Pulmonary effort is normal.  Musculoskeletal:     Cervical back: Normal range of motion.  Skin:    General: Skin is warm and dry.  Neurological:     Mental Status: She is alert.     Mental Status Exam: General Appearance: Casual and Disheveled  Orientation:  Full (Time, Place, and Person)  Memory:  Immediate;   Good Recent;   Good Remote;   Good  Concentration:  Concentration: Good and Attention Span: Good  Recall:  Good  Attention  Fair  Eye Contact:  Good  Speech:  Clear and Coherent  Language:  Good  Volume:  Normal  Mood: fine  Affect:  euthymic  Thought Process:  Coherent  Thought Content:  Logical  Suicidal Thoughts:  No  Homicidal Thoughts:  No  Judgement:  Good  Insight:  Good  Psychomotor Activity:  Normal  Akathisia:  No  Fund of Knowledge:  Good      Assets:  Others:  None identifies.   Cognition:  WNL  ADL's:  Intact  AIMS (if indicated):        Other History   These have been pulled in through the EMR, reviewed, and updated if appropriate.  Family History:  The patient's Family history is unknown by patient.  Medical History: Past Medical History:  Diagnosis Date   Asthma    Depression    HIV (human immunodeficiency virus infection) (HCC)     Surgical History: Past Surgical History:  Procedure Laterality Date   ABDOMINAL HYSTERECTOMY     APPENDECTOMY       Medications:   Current Facility-Administered Medications:    acetaminophen  (TYLENOL ) tablet 650 mg, 650 mg, Oral, Q6H PRN, Meghan Dines, MD, 650 mg at  04/06/24 0645   antiseptic oral rinse (BIOTENE) solution 15 mL, 15 mL, Mouth Rinse, QPC supper, Meghan Atlas, MD   atovaquone  (MEPRON ) 750 MG/5ML suspension 1,500 mg, 1,500 mg, Oral, Q breakfast, Ravishankar, Jayashree, MD, 1,500 mg at 04/06/24 1113   bictegravir-emtricitabine -tenofovir  AF (BIKTARVY ) 50-200-25 MG per tablet 1 tablet, 1 tablet, Oral, Daily, Ravishankar, Jayashree, MD, 1 tablet at 04/06/24 1113   chlorhexidine  (PERIDEX ) 0.12 % solution 15 mL, 15 mL, Mouth/Throat, BID, Elisabeth Guild, NP, 15 mL at 04/06/24 1113   cyclobenzaprine  (FLEXERIL ) tablet 5 mg, 5 mg, Oral, TID PRN, Verla Glaze, MD, 5 mg at 04/06/24 0645   dicyclomine  (BENTYL ) tablet 20 mg, 20 mg, Oral, TID PRN, Alexander, Natalie, DO, 20 mg at 03/02/24 2131   diphenhydrAMINE  (BENADRYL ) capsule 25 mg, 25 mg, Oral, Q8H PRN, Dezii, Alexandra, DO, 25 mg at 04/05/24 2131  DULoxetine  (CYMBALTA ) DR capsule 60 mg, 60 mg, Oral, Daily, Byungura, Veronique M, NP, 60 mg at 04/06/24 1112   enoxaparin  (LOVENOX ) injection 40 mg, 40 mg, Subcutaneous, QHS, Aleskerov, Fuad, MD, 40 mg at 04/05/24 2130   folic acid  (FOLVITE ) tablet 1 mg, 1 mg, Oral, Daily, Aleskerov, Fuad, MD, 1 mg at 04/06/24 1112   hydrocerin (EUCERIN) cream, , Topical, BID, Wieting, Richard, MD, Given at 04/06/24 1116   hydrocortisone  (ANUSOL -HC) 2.5 % rectal cream, , Rectal, QID PRN, Melodi Sprung, DO, Given at 04/04/24 1122   hydrOXYzine  (ATARAX ) tablet 25 mg, 25 mg, Oral, TID PRN, Byungura, Veronique M, NP   ibuprofen  (ADVIL ) tablet 400 mg, 400 mg, Oral, Q6H PRN, Ayiku, Bernard, MD, 400 mg at 04/06/24 1112   ipratropium-albuterol  (DUONEB) 0.5-2.5 (3) MG/3ML nebulizer solution 3 mL, 3 mL, Nebulization, Q6H PRN, Wieting, Richard, MD   lactulose  (CHRONULAC ) 10 GM/15ML solution 20 g, 20 g, Oral, Daily PRN, Amin, Sumayya, MD   lidocaine  (LIDODERM ) 5 % 1 patch, 1 patch, Transdermal, Q24H, Wieting, Richard, MD, 1 patch at 04/05/24 1740   lipase/protease/amylase  (CREON ) capsule 24,000 Units, 24,000 Units, Oral, TID AC, Zhang, Dekui, MD, 24,000 Units at 04/06/24 1112   loperamide  (IMODIUM ) capsule 4 mg, 4 mg, Oral, PRN, Alexander, Natalie, DO, 4 mg at 02/17/24 1833   multivitamin with minerals tablet 1 tablet, 1 tablet, Oral, QHS, Zeigler, Dustin G, RPH, 1 tablet at 04/05/24 2130   nicotine  polacrilex (NICORETTE ) gum 2 mg, 2 mg, Oral, PRN, Elisabeth Guild, NP   ondansetron  (ZOFRAN -ODT) disintegrating tablet 4 mg, 4 mg, Oral, Q6H PRN, Djan, Prince T, MD, 4 mg at 03/09/24 1191   Oral care mouth rinse, 15 mL, Mouth Rinse, PRN, Lorita Rosa, MD   Oral care mouth rinse, 15 mL, Mouth Rinse, PRN, Elisabeth Guild, NP   pantoprazole  (PROTONIX ) EC tablet 40 mg, 40 mg, Oral, BID, Wieting, Richard, MD, 40 mg at 04/06/24 1112   polyethylene glycol (MIRALAX  / GLYCOLAX ) packet 17 g, 17 g, Oral, Daily PRN, Verla Glaze, MD, 17 g at 01/28/24 0959   senna-docusate (Senokot-S) tablet 2 tablet, 2 tablet, Oral, BID, Donaciano Frizzle, MD, 2 tablet at 04/06/24 1112   thiamine  (VITAMIN B1) tablet 100 mg, 100 mg, Oral, Daily, Aleskerov, Fuad, MD, 100 mg at 04/06/24 1112  Allergies: Allergies  Allergen Reactions   Fish Allergy Other (See Comments)    Crab legs result in itching  Crab legs result in itching  Crab legs result in itching   Shellfish Allergy Other (See Comments)    Crab legs result in itching   Buprenorphine Hcl     Pt states she not allergic   Morphine And Codeine Itching    hives   Sulfa  Antibiotics Rash    Ninetta Adelstein, MD

## 2024-04-07 DIAGNOSIS — F02818 Dementia in other diseases classified elsewhere, unspecified severity, with other behavioral disturbance: Secondary | ICD-10-CM | POA: Diagnosis not present

## 2024-04-07 DIAGNOSIS — B20 Human immunodeficiency virus [HIV] disease: Secondary | ICD-10-CM | POA: Diagnosis not present

## 2024-04-07 NOTE — Plan of Care (Signed)
  Problem: Coping: Goal: Ability to adjust to condition or change in health will improve Outcome: Progressing   Problem: Fluid Volume: Goal: Ability to maintain a balanced intake and output will improve Outcome: Progressing   Problem: Health Behavior/Discharge Planning: Goal: Ability to identify and utilize available resources and services will improve Outcome: Progressing Goal: Ability to manage health-related needs will improve Outcome: Progressing   Problem: Metabolic: Goal: Ability to maintain appropriate glucose levels will improve Outcome: Progressing   Problem: Nutritional: Goal: Maintenance of adequate nutrition will improve Outcome: Progressing Goal: Progress toward achieving an optimal weight will improve Outcome: Progressing   Problem: Skin Integrity: Goal: Risk for impaired skin integrity will decrease Outcome: Progressing   Problem: Tissue Perfusion: Goal: Adequacy of tissue perfusion will improve Outcome: Progressing   Problem: Education: Goal: Knowledge of General Education information will improve Description: Including pain rating scale, medication(s)/side effects and non-pharmacologic comfort measures Outcome: Progressing   Problem: Health Behavior/Discharge Planning: Goal: Ability to manage health-related needs will improve Outcome: Progressing   Problem: Clinical Measurements: Goal: Ability to maintain clinical measurements within normal limits will improve Outcome: Progressing Goal: Will remain free from infection Outcome: Progressing Goal: Diagnostic test results will improve Outcome: Progressing Goal: Respiratory complications will improve Outcome: Progressing Goal: Cardiovascular complication will be avoided Outcome: Progressing   Problem: Activity: Goal: Risk for activity intolerance will decrease Outcome: Progressing   Problem: Nutrition: Goal: Adequate nutrition will be maintained Outcome: Progressing   Problem: Coping: Goal:  Level of anxiety will decrease Outcome: Progressing   Problem: Elimination: Goal: Will not experience complications related to bowel motility Outcome: Progressing Goal: Will not experience complications related to urinary retention Outcome: Progressing   Problem: Pain Management: Goal: General experience of comfort will improve Outcome: Progressing   Problem: Safety: Goal: Ability to remain free from injury will improve Outcome: Progressing   Problem: Skin Integrity: Goal: Risk for impaired skin integrity will decrease Outcome: Progressing   Problem: Activity: Goal: Ability to tolerate increased activity will improve Outcome: Progressing   Problem: Respiratory: Goal: Ability to maintain a clear airway and adequate ventilation will improve Outcome: Progressing

## 2024-04-07 NOTE — Progress Notes (Signed)
 PROGRESS NOTE   Meghan Jarvis  PIR:[id number] DOB: 06-30-1976 DOA: 12/08/2023 P[facility]: [name]    HPI was taken from NP [name]: 48 y.o with significant PMH of EtOH abuse ,Cocaine use, Homelessness, Uncontrolled HIV, on BIKTARVY , A-fib, GERD, Hypothyroidism, Diabetes, who presented to the ED with chief complaints of unresponsiveness.   Per ED reports, EMS report that patient was boarding at a friend's house who noticed that she had not woken up the whole day. Patient's friend attempted to wake her up but noticed that she was unresponsive and making "gurgling" sound so they called EMS. On EMS arrival, patient was found to be unconscious and incontinent of urine and feces.     ED Course: Initial vital signs showed HR of 118 beats/minute, BP 90/60 mm Hg, the RR 25 breaths/minute, and the oxygen saturation 98% on NRB and a temperature of 100.85F. Patient was obtunded with no gag reflex with debris around the mouth and nose concerning for stomach contents. She was emergently intubated for airway protection. Pertinent Labs/Diagnostics Findings: Na+/ K+:140/2.6  Glucose: 117 BUN/Cr.:33/1.18 AST/ALT:45/17 WBC:9.6 K/L PCT: 2.2 Lactic acid: 1.1 COVID PCR: Negative,  ABG: pO2 139; pCO2 27; pH 7.51;  HCO3 21.5, %O2 Sat 100.  CXR> see full report  Medication administered in the ED: Patient given 30 cc/kg of fluids and started on broad-spectrum antibiotics Ceftriaxone  and Azithromycin  for suspected sepsis Disposition:ICU   As per Dr. Broadus Canes 4/16-4/22/25: Pt has remained medically stable. Still awaiting guardianship hearing date. 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 Poor historian and does not have capacity to make decisions as per psych. Still waiting on guardianship hearing date. Continue on biktarvy . Continue on atovaquone  for PCP ppx  HIV associated neurocognitive disorder: MRI did not show any enhancing lesions but did show severe cerebral atrophy likely secondary to HIV/AIDS.    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 Continue w/ conservative management   Diarrhea: Resolved    Hypotension: resolved. Holding midodrine    Poor dental hygiene, one left maxillary tooth fell off on 4/23 Started oral rinse nightly for 2 weeks  Depression due to prolonged stay in the hospital and unable to see significant other. Psych consulted for further recommendation.   Overweight, BMI 30 Significant weight gain during hospital stay Nutritionist consulted  Social issues: homeless and has polysubstance use disorder (alcohol, cocaine).  Pt's boyfriend/fiance' is not able to visit the pt anymore b/c concern of him bringing the pt alcohol and/or illicit drugs. Still awaiting guardianship. No safe d/c plan      DVT prophylaxis: lovenox  Code Status: full  Family Communication:  Disposition Plan: awaiting guardianship still. Unsafe d/c plan   Level of care: Med-Surg Consultants:  Psych  Ortho surg  ID  Procedures:  Antimicrobials:   Subjective: No significant events overnight, patient was sitting on the bed comfortably, she was talking over the phone with her boyfriend.  Denied any complaints, seems happy.   Objective: Vitals:   04/06/24 1709 04/06/24 2018 04/07/24 0213 04/07/24 0848  BP: 123/87 118/81 125/84 116/80  Pulse: (!) 101 (!) 104 98 98  Resp: 19  18 17 18   Temp: 98.3 F (36.8 C) 98.1 F (36.7 C) 98 F (36.7 C) 98.5 F (36.9 C)  TempSrc: Oral Oral Oral Oral  SpO2: 100% 99% 99% 99%  Weight:      Height:        Intake/Output Summary (Last 24 hours) at  04/07/2024 1550 Last data filed at 04/06/2024 1943 Gross per 24 hour  Intake 360 ml  Output --  Net 360 ml   Filed Weights   04/03/24 0500 04/04/24 0500 04/05/24 0407  Weight: 88.9 kg 89.1 kg 89.9 kg    Examination:  General exam: Appears calm & comfortable  Respiratory system: clear breath sounds b/l  Cardiovascular system: S1 & S2+. No rubs or gallops   Gastrointestinal system: abd is soft, NT, ND & hypoactive bowel sounds  Central nervous system: alert & awake. Moves all extremities  Psychiatry: judgement and insight appears poor. Flat mood and affect    Data Reviewed: I have personally reviewed following labs and imaging studies  CBC: Recent Labs  Lab 04/01/24 0611 04/03/24 0440  WBC 4.6 4.4  HGB 9.7* 9.5*  HCT 30.4* 30.1*  MCV 84.7 84.6  PLT 298 295   Basic Metabolic Panel: Recent Labs  Lab 04/01/24 0611 04/03/24 0440  NA 140 138  K 3.8 3.8  CL 107 107  CO2 26 25  GLUCOSE 115* 116*  BUN 12 14  CREATININE 0.72 0.79  CALCIUM  9.3 9.2  MG  --  1.8  PHOS  --  4.7*   GFR: Estimated Creatinine Clearance: 99.8 mL/min (by C-G formula based on SCr of 0.79 mg/dL). Liver Function Tests: No results for input(s): "AST", "ALT", "ALKPHOS", "BILITOT", "PROT", "ALBUMIN" in the last 168 hours. No results for input(s): "LIPASE", "AMYLASE" in the last 168 hours. No results for input(s): "AMMONIA" in the last 168 hours. Coagulation Profile: No results for input(s): "INR", "PROTIME" in the last 168 hours. Cardiac Enzymes: No results for input(s): "CKTOTAL", "CKMB", "CKMBINDEX", "TROPONINI" in the last 168 hours. BNP (last 3 results) No results for input(s): "PROBNP" in the last 8760 hours. HbA1C: No results for input(s): "HGBA1C" in the last 72 hours. CBG: No results for input(s): "GLUCAP" in the last 168 hours. Lipid Profile: No results for input(s): "CHOL", "HDL", "LDLCALC", "TRIG", "CHOLHDL", "LDLDIRECT" in the last 72 hours. Thyroid Function Tests: No results  for input(s): "TSH", "T4TOTAL", "FREET4", "T3FREE", "THYROIDAB" in the last 72 hours. Anemia Panel: No results for input(s): "VITAMINB12", "FOLATE", "FERRITIN", "TIBC", "IRON", "RETICCTPCT" in the last 72 hours. Sepsis Labs: No results for input(s): "PROCALCITON", "LATICACIDVEN" in the last 168 hours.  No results found for this or any previous visit (from the past 240 hours).    Radiology Studies: No results found.   Scheduled Meds:  antiseptic oral rinse  15 mL Mouth Rinse QPC supper   atovaquone   1,500 mg Oral Q breakfast   bictegravir-emtricitabine -tenofovir  AF  1 tablet Oral Daily   chlorhexidine   15 mL Mouth/Throat BID   DULoxetine   60 mg Oral Daily   enoxaparin  (LOVENOX ) injection  40 mg Subcutaneous QHS   folic acid   1 mg Oral Daily   hydrocerin   Topical BID   lidocaine   1 patch Transdermal Q24H   lipase/protease/amylase  24,000 Units Oral TID AC   multivitamin with minerals  1 tablet Oral QHS   pantoprazole   40 mg Oral BID   senna-docusate  2 tablet Oral BID   thiamine   100 mg Oral Daily   Continuous Infusions:  LOS: 121 days     Althia Atlas, MD [facility] Pager [phone]  If 7PM-7AM, please contact night-coverage [email or web address] 04/07/2024, 3:50 PM

## 2024-04-07 NOTE — TOC Progression Note (Signed)
 Transition of Care [facility]) - Progression Note    Patient Details  Name: Meghan Jarvis MRN: [id number] Date of Birth: 08/06/1976  Transition of Care [facility]) CM/SW Contact  Odilia Bennett, LCSW Phone Number: 04/07/2024, 9:00 AM  Clinical Narrative:  APS supervisor is checking status of court date.   Expected Discharge Plan and Services         Expected Discharge Date: 01/10/24                                     Social Determinants of Health (SDOH) Interventions SDOH Screenings   Food Insecurity: Patient Unable To Answer (12/09/2023)  Recent Concern: Food Insecurity - Food Insecurity Present (09/26/2023)  Housing: Patient Unable To Answer (12/09/2023)  Recent Concern: Housing - Medium Risk (09/26/2023)  Transportation Needs: Patient Unable To Answer (12/09/2023)  Recent Concern: Transportation Needs - Unmet Transportation Needs (10/11/2023)  Utilities: Patient Unable To Answer (12/09/2023)  Recent Concern: Utilities - At Risk (09/25/2023)  Tobacco Use: High Risk (12/08/2023)    Readmission Risk Interventions     No data to display

## 2024-04-08 DIAGNOSIS — B20 Human immunodeficiency virus [HIV] disease: Secondary | ICD-10-CM | POA: Diagnosis not present

## 2024-04-08 DIAGNOSIS — F02818 Dementia in other diseases classified elsewhere, unspecified severity, with other behavioral disturbance: Secondary | ICD-10-CM | POA: Diagnosis not present

## 2024-04-08 LAB — BASIC METABOLIC PANEL WITH GFR
Anion gap: 10 (ref 5–15)
BUN: 14 mg/dL (ref 6–20)
CO2: 24 mmol/L (ref 22–32)
Calcium: 9 mg/dL (ref 8.9–10.3)
Chloride: 106 mmol/L (ref 98–111)
Creatinine, Ser: 0.73 mg/dL (ref 0.44–1.00)
GFR, Estimated: >60 mL/min (ref >60)
Glucose, Bld: 128 mg/dL — ABNORMAL HIGH (ref 70–99)
Potassium: 3.6 mmol/L (ref 3.5–5.1)
Sodium: 140 mmol/L (ref 135–145)

## 2024-04-08 LAB — BRAIN NATRIURETIC PEPTIDE: B Natriuretic Peptide: 70.7 pg/mL (ref 0.0–100.0)

## 2024-04-08 MED ORDER — TORSEMIDE 20 MG PO TABS
40.0000 mg | ORAL_TABLET | Freq: Two times a day (BID) | ORAL | Status: DC
  Administered 2024-04-08 – 2024-04-09 (×2): 40 mg via ORAL
  Filled 2024-04-08 (×2): qty 2

## 2024-04-08 MED ORDER — FUROSEMIDE 10 MG/ML IJ SOLN: 40.0000 mg | Freq: Two times a day (BID) | INTRAMUSCULAR | Status: DC

## 2024-04-08 MED ORDER — FUROSEMIDE 10 MG/ML IJ SOLN
160.0000 mg | Freq: Once | INTRAVENOUS | Status: DC
  Filled 2024-04-08: qty 16

## 2024-04-08 NOTE — TOC Progression Note (Signed)
 Transition of Care [facility]) - Progression Note    Patient Details  Name: Meghan Jarvis MRN: [id number] Date of Birth: 02/24/1976  Transition of Care [facility]) CM/SW Contact  Odilia Bennett, LCSW Phone Number: 04/08/2024, 11:55 AM  Clinical Narrative:  APS supervisor is waiting for a response from their legal team regarding update on court date.   Expected Discharge Plan and Services         Expected Discharge Date: 01/10/24                                     Social Determinants of Health (SDOH) Interventions SDOH Screenings   Food Insecurity: Patient Unable To Answer (12/09/2023)  Recent Concern: Food Insecurity - Food Insecurity Present (09/26/2023)  Housing: Patient Unable To Answer (12/09/2023)  Recent Concern: Housing - Medium Risk (09/26/2023)  Transportation Needs: Patient Unable To Answer (12/09/2023)  Recent Concern: Transportation Needs - Unmet Transportation Needs (10/11/2023)  Utilities: Patient Unable To Answer (12/09/2023)  Recent Concern: Utilities - At Risk (09/25/2023)  Tobacco Use: High Risk (12/08/2023)    Readmission Risk Interventions     No data to display

## 2024-04-08 NOTE — Progress Notes (Signed)
 PROGRESS NOTE   Meghan Jarvis  HQI:[id number] DOB: January 12, 1976 DOA: 12/08/2023 [name]    HPI was taken from NP [name]: 48 y.o with significant PMH of EtOH abuse ,Cocaine use, Homelessness, Uncontrolled HIV, on BIKTARVY , A-fib, GERD, Hypothyroidism, Diabetes, who presented to the ED with chief complaints of unresponsiveness.   Per ED reports, EMS report that patient was boarding at a friend's house who noticed that she had not woken up the whole day. Patient's friend attempted to wake her up but noticed that she was unresponsive and making "gurgling" sound so they called EMS. On EMS arrival, patient was found to be unconscious and incontinent of urine and feces.     ED Course: Initial vital signs showed HR of 118 beats/minute, BP 90/60 mm Hg, the RR 25 breaths/minute, and the oxygen saturation 98% on NRB and a temperature of 100.50F. Patient was obtunded with no gag reflex with debris around the mouth and nose concerning for stomach contents. She was emergently intubated for airway protection. Pertinent Labs/Diagnostics Findings: Na+/ K+:140/2.6  Glucose: 117 BUN/Cr.:33/1.18 AST/ALT:45/17 WBC:9.6 K/L PCT: 2.2 Lactic acid: 1.1 COVID PCR: Negative,  ABG: pO2 139; pCO2 27; pH 7.51;  HCO3 21.5, %O2 Sat 100.  CXR> see full report  Medication administered in the ED: Patient given 30 cc/kg of fluids and started on broad-spectrum antibiotics Ceftriaxone  and Azithromycin  for suspected sepsis Disposition:ICU   As per Dr. Broadus Canes 4/16-4/22/25: Pt has remained medically stable. Still awaiting guardianship hearing date. 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 Poor historian and does not have capacity to make decisions as per psych. Still waiting on guardianship hearing date. Continue on biktarvy . Continue on atovaquone  for PCP ppx  HIV associated neurocognitive disorder: MRI did not show any enhancing lesions but did show severe cerebral atrophy likely secondary to HIV/AIDS.    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 Continue w/ conservative management   Diarrhea: Resolved    Hypotension: resolved. Holding midodrine    Poor dental hygiene, one left maxillary tooth fell off on 4/23 Started oral rinse nightly for 2 weeks  Depression due to prolonged stay in the hospital and unable to see significant other. Psych consulted for further recommendation.   Overweight, BMI 30 Significant weight gain during hospital stay Nutritionist consulted  Social issues: homeless and has polysubstance use disorder (alcohol, cocaine).  Pt's boyfriend/fiance' is not able to visit the pt anymore b/c concern of him bringing the pt alcohol and/or illicit drugs. Still awaiting guardianship. No safe d/c plan      DVT prophylaxis: lovenox  Code Status: full  Family Communication:  Disposition Plan: awaiting guardianship still. Unsafe d/c plan   Level of care: Med-Surg Consultants:  Psych  Ortho surg  ID  Procedures:  Antimicrobials:   Subjective: No significant events overnight, patient was complaining of edema in the bilateral lower extremities, no  any other complaints.    Objective: Vitals:   04/07/24 1934 04/08/24 0224 04/08/24 0831 04/08/24 1549  BP: (!) 123/91 117/86 (!) 121/94 (!) 128/95  Pulse: (!) 106 97 87 (!) 103  Resp: 16 16 18 18   Temp: 98.2 F (36.8 C) 98.4 F  (36.9 C) 98.2 F (36.8 C) 98.6 F (37 C)  TempSrc:   Oral Oral  SpO2: 99% 100% 100% 99%  Weight:      Height:       No intake or output data in the 24 hours ending 04/08/24 1618  Filed Weights   04/03/24 0500 04/04/24 0500 04/05/24 0407  Weight: 88.9 kg 89.1 kg 89.9 kg    Examination:  General exam: Appears calm & comfortable  Respiratory system: clear breath sounds b/l  Cardiovascular system: S1 & S2+. No rubs or gallops   Gastrointestinal system: abd is soft, NT, ND & hypoactive bowel sounds  Central nervous system: alert & awake. Moves all extremities  Psychiatry: judgement and insight appears poor. Flat mood and affect Extremities: 2+ pedal edema, no calf tenderness   Data Reviewed: I have personally reviewed following labs and imaging studies  CBC: Recent Labs  Lab 04/03/24 0440  WBC 4.4  HGB 9.5*  HCT 30.1*  MCV 84.6  PLT 295   Basic Metabolic Panel: Recent Labs  Lab 04/03/24 0440 04/08/24 1501  NA 138 140  K 3.8 3.6  CL 107 106  CO2 25 24  GLUCOSE 116* 128*  BUN 14 14  CREATININE 0.79 0.73  CALCIUM  9.2 9.0  MG 1.8  --   PHOS 4.7*  --    GFR: Estimated Creatinine Clearance: 99.8 mL/min (by C-G formula based on SCr of 0.73 mg/dL). Liver Function Tests: No results for input(s): "AST", "ALT", "ALKPHOS", "BILITOT", "PROT", "ALBUMIN" in the last 168 hours. No results for input(s): "LIPASE", "AMYLASE" in the last 168 hours. No results for input(s): "AMMONIA" in the last 168 hours. Coagulation Profile: No results for input(s): "INR", "PROTIME" in the last 168 hours. Cardiac Enzymes: No results for input(s): "CKTOTAL", "CKMB", "CKMBINDEX", "TROPONINI" in the last 168 hours. BNP (last 3 results) No results for input(s): "PROBNP" in the last 8760 hours. HbA1C: No results for input(s): "HGBA1C" in the last 72 hours. CBG: No results for input(s): "GLUCAP" in the last 168 hours. Lipid Profile: No results for input(s): "CHOL", "HDL", "LDLCALC", "TRIG",  "CHOLHDL", "LDLDIRECT" in the last 72 hours. Thyroid Function Tests: No results for input(s): "TSH", "T4TOTAL", "FREET4", "T3FREE", "THYROIDAB" in the last 72 hours. Anemia Panel: No results for input(s): "VITAMINB12", "FOLATE", "FERRITIN", "TIBC", "IRON", "RETICCTPCT" in the last 72 hours. Sepsis Labs: No results for input(s): "PROCALCITON", "LATICACIDVEN" in the last 168 hours.  No results found for this or any previous visit (from the past 240 hours).    Radiology Studies: No results found.   Scheduled Meds:  antiseptic oral rinse  15 mL Mouth Rinse QPC supper   atovaquone   1,500 mg Oral Q breakfast   bictegravir-emtricitabine -tenofovir  AF  1 tablet Oral Daily   chlorhexidine   15 mL Mouth/Throat BID   DULoxetine   60 mg Oral Daily   enoxaparin  (LOVENOX ) injection  40 mg Subcutaneous QHS   folic acid   1 mg Oral Daily   [START ON 04/09/2024] furosemide  40 mg Intravenous BID   hydrocerin   Topical BID   lidocaine   1 patch Transdermal Q24H   lipase/protease/amylase  24,000 Units Oral TID AC   multivitamin with minerals  1 tablet Oral QHS  pantoprazole   40 mg Oral BID   senna-docusate  2 tablet Oral BID   thiamine   100 mg Oral Daily   Continuous Infusions:  furosemide       LOS: 122 days     Althia Atlas, MD [facility] Pager [phone]  If 7PM-7AM, please contact night-coverage [email or web address] 04/08/2024, 4:18 PM

## 2024-04-09 ENCOUNTER — Inpatient Hospital Stay
Admit: 2024-04-09 | Discharge: 2024-04-09 | Disposition: A | Discharge status: Home / Self Care | Payer: MEDICAID | Attending: Student | Admitting: Student

## 2024-04-09 ENCOUNTER — Inpatient Hospital Stay: Admit: 2024-04-09 | Payer: MEDICAID

## 2024-04-09 DIAGNOSIS — M60851 Other myositis, right thigh: Secondary | ICD-10-CM | POA: Diagnosis not present

## 2024-04-09 DIAGNOSIS — I5031 Acute diastolic (congestive) heart failure: Secondary | ICD-10-CM | POA: Diagnosis not present

## 2024-04-09 DIAGNOSIS — B20 Human immunodeficiency virus [HIV] disease: Secondary | ICD-10-CM | POA: Diagnosis not present

## 2024-04-09 DIAGNOSIS — F03B3 Unspecified dementia, moderate, with mood disturbance: Secondary | ICD-10-CM | POA: Diagnosis not present

## 2024-04-09 DIAGNOSIS — F02818 Dementia in other diseases classified elsewhere, unspecified severity, with other behavioral disturbance: Secondary | ICD-10-CM | POA: Diagnosis not present

## 2024-04-09 DIAGNOSIS — A480 Gas gangrene: Secondary | ICD-10-CM | POA: Diagnosis not present

## 2024-04-09 LAB — CBC
HCT: 30.3 % — ABNORMAL LOW (ref 36.0–46.0)
Hemoglobin: 10 g/dL — ABNORMAL LOW (ref 12.0–15.0)
MCH: 26.9 pg (ref 26.0–34.0)
MCHC: 33 g/dL (ref 30.0–36.0)
MCV: 81.5 fL (ref 80.0–100.0)
Platelets: 315 10*3/uL (ref 150–400)
RBC: 3.72 MIL/uL — ABNORMAL LOW (ref 3.87–5.11)
RDW: 14.4 % (ref 11.5–15.5)
WBC: 4.5 10*3/uL (ref 4.0–10.5)
nRBC: 0 % (ref 0.0–0.2)

## 2024-04-09 LAB — BASIC METABOLIC PANEL WITH GFR
Anion gap: 9 (ref 5–15)
BUN: 14 mg/dL (ref 6–20)
CO2: 28 mmol/L (ref 22–32)
Calcium: 9.5 mg/dL (ref 8.9–10.3)
Chloride: 103 mmol/L (ref 98–111)
Creatinine, Ser: 0.75 mg/dL (ref 0.44–1.00)
GFR, Estimated: >60 mL/min (ref >60)
Glucose, Bld: 96 mg/dL (ref 70–99)
Potassium: 3.8 mmol/L (ref 3.5–5.1)
Sodium: 140 mmol/L (ref 135–145)

## 2024-04-09 LAB — ECHOCARDIOGRAM COMPLETE
AR max vel: 2.7 cm2
AV Area VTI: 2.74 cm2
AV Area mean vel: 2.3 cm2
AV Mean grad: 4 mmHg
AV Peak grad: 8.1 mmHg
Ao pk vel: 1.42 m/s
Area-P 1/2: 4.89 cm2
Height: 67.992 in
MV VTI: 3.24 cm2
S' Lateral: 3.2 cm
Weight: 3100.55 [oz_av]

## 2024-04-09 LAB — HEPATIC FUNCTION PANEL
ALT: 23 U/L (ref 0–44)
AST: 29 U/L (ref 15–41)
Albumin: 3.4 g/dL — ABNORMAL LOW (ref 3.5–5.0)
Alkaline Phosphatase: 57 U/L (ref 38–126)
Bilirubin, Direct: 0.1 mg/dL (ref 0.0–0.2)
Indirect Bilirubin: 0.3 mg/dL (ref 0.3–0.9)
Total Bilirubin: 0.4 mg/dL (ref 0.0–1.2)
Total Protein: 7.5 g/dL (ref 6.5–8.1)

## 2024-04-09 MED ORDER — TORSEMIDE 20 MG PO TABS
40.0000 mg | ORAL_TABLET | Freq: Every day | ORAL | Status: DC
  Administered 2024-04-10: 40 mg via ORAL
  Filled 2024-04-09: qty 2

## 2024-04-09 MED ORDER — TORSEMIDE 20 MG PO TABS
40.0000 mg | ORAL_TABLET | Freq: Two times a day (BID) | ORAL | Status: AC
  Administered 2024-04-09: 40 mg via ORAL
  Filled 2024-04-09: qty 2

## 2024-04-09 NOTE — Progress Notes (Signed)
 PROGRESS NOTE   Meghan Jarvis  ZHY:[id number] DOB: 03/16/1976 DOA: 12/08/2023 [name]    HPI was taken from NP [name]: 48 y.o with significant PMH of EtOH abuse ,Cocaine use, Homelessness, Uncontrolled HIV, on BIKTARVY , A-fib, GERD, Hypothyroidism, Diabetes, who presented to the ED with chief complaints of unresponsiveness.   Per ED reports, EMS report that patient was boarding at a friend's house who noticed that she had not woken up the whole day. Patient's friend attempted to wake her up but noticed that she was unresponsive and making "gurgling" sound so they called EMS. On EMS arrival, patient was found to be unconscious and incontinent of urine and feces.     ED Course: Initial vital signs showed HR of 118 beats/minute, BP 90/60 mm Hg, the RR 25 breaths/minute, and the oxygen saturation 98% on NRB and a temperature of 100.85F. Patient was obtunded with no gag reflex with debris around the mouth and nose concerning for stomach contents. She was emergently intubated for airway protection. Pertinent Labs/Diagnostics Findings: Na+/ K+:140/2.6  Glucose: 117 BUN/Cr.:33/1.18 AST/ALT:45/17 WBC:9.6 K/L PCT: 2.2 Lactic acid: 1.1 COVID PCR: Negative,  ABG: pO2 139; pCO2 27; pH 7.51;  HCO3 21.5, %O2 Sat 100.  CXR> see full report  Medication administered in the ED: Patient given 30 cc/kg of fluids and started on broad-spectrum antibiotics Ceftriaxone  and Azithromycin  for suspected sepsis Disposition:ICU   As per Dr. Broadus Canes 4/16-4/22/25: Pt has remained medically stable. Still awaiting guardianship hearing date. 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 twice daily, x 3 doses given followed by torsemide 40 mg p.o. daily from 5/1 BNP 70.7 wnl  Continue fluid restriction 1.5 L/day TTE LVEF 55 to 60%, grade 1 diastolic dysfunction,   HIV: w/ progression to AIDS. Poor historian and does not have capacity to make decisions as per psych. Still waiting on guardianship hearing date. Continue on biktarvy . Continue on atovaquone  for PCP ppx  HIV associated neurocognitive disorder: MRI did not show any enhancing lesions but did show severe cerebral atrophy likely secondary to HIV/AIDS.    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 Continue w/ conservative management   Diarrhea: Resolved    Hypotension: resolved. Holding midodrine    Poor dental hygiene, one left maxillary tooth fell off on 4/23 Started oral rinse nightly for 2 weeks  Depression due to prolonged stay in the hospital and unable to see significant other. Psych consulted for further recommendation.   Overweight, BMI 30 Significant weight gain during hospital stay Nutritionist consulted  Social issues: homeless and has polysubstance use disorder (alcohol, cocaine).  Pt's boyfriend/fiance' is not able to visit the pt anymore b/c concern of him bringing the pt alcohol and/or illicit drugs. Still awaiting guardianship. No safe d/c plan      DVT prophylaxis: lovenox  Code Status: full  Family Communication:  Disposition Plan: awaiting guardianship still. Unsafe d/c plan   Level of care: Med-Surg Consultants:  Psych  Ortho surg  ID  Procedures:  Antimicrobials:   Subjective: No significant events overnight, lower extremity  edema is improving, patient is making enough urine.  Denied any other complaints   Objective: Vitals:   04/09/24 0435 04/09/24 0500 04/09/24 0541 04/09/24 0756  BP: 109/79  (!) 109/94 127/85  Pulse: 95  98 100   Resp: 18  20 15   Temp: 98.1 F (36.7 C)  99 F (37.2 C)   TempSrc: Oral  Oral   SpO2: 97%  99% 99%  Weight:  87.9 kg    Height:        Intake/Output Summary (Last 24 hours) at 04/09/2024 1644 Last data filed at 04/09/2024 0100 Gross per 24 hour  Intake 118 ml  Output --  Net 118 ml    Filed Weights   04/04/24 0500 04/05/24 0407 04/09/24 0500  Weight: 89.1 kg 89.9 kg 87.9 kg    Examination:  General exam: Appears calm & comfortable  Respiratory system: clear breath sounds b/l  Cardiovascular system: S1 & S2+. No rubs or gallops   Gastrointestinal system: abd is soft, NT, ND & hypoactive bowel sounds  Central nervous system: alert & awake. Moves all extremities  Psychiatry: judgement and insight appears poor. Flat mood and affect Extremities: 1-2+ pedal edema, no calf tenderness.  Edema improved   Data Reviewed: I have personally reviewed following labs and imaging studies  CBC: Recent Labs  Lab 04/03/24 0440 04/09/24 0547  WBC 4.4 4.5  HGB 9.5* 10.0*  HCT 30.1* 30.3*  MCV 84.6 81.5  PLT 295 315   Basic Metabolic Panel: Recent Labs  Lab 04/03/24 0440 04/08/24 1501 04/09/24 0547  NA 138 140 140  K 3.8 3.6 3.8  CL 107 106 103  CO2 25 24 28   GLUCOSE 116* 128* 96  BUN 14 14 14   CREATININE 0.79 0.73 0.75  CALCIUM  9.2 9.0 9.5  MG 1.8  --   --   PHOS 4.7*  --   --    GFR: Estimated Creatinine Clearance: 98.7 mL/min (by C-G formula based on SCr of 0.75 mg/dL). Liver Function Tests: Recent Labs  Lab 04/09/24 0547  AST 29  ALT 23  ALKPHOS 57  BILITOT 0.4  PROT 7.5  ALBUMIN 3.4*   No results for input(s): "LIPASE", "AMYLASE" in the last 168 hours. No results for input(s): "AMMONIA" in the last 168 hours. Coagulation Profile: No results for input(s): "INR", "PROTIME" in the last 168 hours. Cardiac Enzymes: No results for input(s): "CKTOTAL", "CKMB", "CKMBINDEX", "TROPONINI" in the last 168 hours. BNP (last 3 results) No results for input(s):  "PROBNP" in the last 8760 hours. HbA1C: No results for input(s): "HGBA1C" in the last 72 hours. CBG: No results for input(s): "GLUCAP" in the last 168 hours. Lipid Profile: No results for input(s): "CHOL", "HDL", "LDLCALC", "TRIG", "CHOLHDL", "LDLDIRECT" in the last 72 hours. Thyroid Function Tests: No results for input(s): "TSH", "T4TOTAL", "FREET4", "T3FREE", "THYROIDAB" in the last 72 hours. Anemia Panel: No results for input(s): "VITAMINB12", "FOLATE", "FERRITIN", "TIBC", "IRON", "RETICCTPCT" in the last 72 hours. Sepsis Labs: No results for input(s): "PROCALCITON", "LATICACIDVEN" in the last 168 hours.  No results found for this or any previous visit (from the past 240 hours).    Radiology Studies: ECHOCARDIOGRAM COMPLETE Result Date: 04/09/2024    ECHOCARDIOGRAM REPORT   Patient Name:   Meghan Jarvis Date of Exam: 04/09/2024 Medical Rec #:  [id number]       Height:       68.0 in Accession #:    [id number]      Weight:  193.8 lb Date of Birth:  1976/03/26       BSA:          2.017 m Patient Age:    47 years        BP:           127/85 mmHg Patient Gender: F               HR:           100 bpm. Exam Location:  [facility] Procedure: 2D Echo, Cardiac Doppler and Color Doppler (Both Spectral and Color            Flow Doppler were utilized during procedure). Indications:     CHF-acute diastolic I50.31  History:         Patient has prior history of Echocardiogram examinations, most                  recent 05/11/2020. No cardiac history listed in chart.  Sonographer:     Broadus Canes Referring Phys:  [id number] Althia Atlas Diagnosing Phys: Belva Boyden MD IMPRESSIONS  1. Left ventricular ejection fraction, by estimation, is 55 to 60%. The left ventricle has normal function. The left ventricle has no regional wall motion abnormalities. Left ventricular diastolic parameters are consistent with Grade I diastolic dysfunction (impaired relaxation).  2. Right ventricular systolic function is normal. The right  ventricular size is normal. There is normal pulmonary artery systolic pressure. The estimated right ventricular systolic pressure is 24.9 mmHg.  3. The mitral valve is normal in structure. No evidence of mitral valve regurgitation. No evidence of mitral stenosis.  4. The aortic valve is normal in structure. Aortic valve regurgitation is not visualized. No aortic stenosis is present.  5. The inferior vena cava is normal in size with greater than 50% respiratory variability, suggesting right atrial pressure of 3 mmHg. FINDINGS  Left Ventricle: Left ventricular ejection fraction, by estimation, is 55 to 60%. The left ventricle has normal function. The left ventricle has no regional wall motion abnormalities. Strain was performed and the global longitudinal strain is indeterminate. The left ventricular internal cavity size was normal in size. There is no left ventricular hypertrophy. Left ventricular diastolic parameters are consistent with Grade I diastolic dysfunction (impaired relaxation). Right Ventricle: The right ventricular size is normal. No increase in right ventricular wall thickness. Right ventricular systolic function is normal. There is normal pulmonary artery systolic pressure. The tricuspid regurgitant velocity is 2.23 m/s, and  with an assumed right atrial pressure of 5 mmHg, the estimated right ventricular systolic pressure is 24.9 mmHg. Left Atrium: Left atrial size was normal in size. Right Atrium: Right atrial size was normal in size. Pericardium: There is no evidence of pericardial effusion. Mitral Valve: The mitral valve is normal in structure. Mild mitral annular calcification. No evidence of mitral valve regurgitation. No evidence of mitral valve stenosis. MV peak gradient, 4.2 mmHg. The mean mitral valve gradient is 3.0 mmHg. Tricuspid Valve: The tricuspid valve is normal in structure. Tricuspid valve regurgitation is mild . No evidence of tricuspid stenosis. Aortic Valve: The aortic valve is  normal in structure. Aortic valve regurgitation is not visualized. No aortic stenosis is present. Aortic valve mean gradient measures 4.0 mmHg. Aortic valve peak gradient measures 8.1 mmHg. Aortic valve area, by VTI measures 2.74 cm. Pulmonic Valve: The pulmonic valve was normal in structure. Pulmonic valve regurgitation is not visualized. No evidence of pulmonic stenosis. Aorta: The aortic root is normal in size and structure.  Venous: The inferior vena cava is normal in size with greater than 50% respiratory variability, suggesting right atrial pressure of 3 mmHg. IAS/Shunts: No atrial level shunt detected by color flow Doppler. Additional Comments: 3D was performed not requiring image post processing on an independent workstation and was indeterminate.  LEFT VENTRICLE PLAX 2D LVIDd:         4.80 cm   Diastology LVIDs:         3.20 cm   LV e' medial:    10.30 cm/s LV PW:         0.90 cm   LV E/e' medial:  6.8 LV IVS:        1.30 cm   LV e' lateral:   11.90 cm/s LVOT diam:     2.00 cm   LV E/e' lateral: 5.9 LV SV:         63 LV SV Index:   31 LVOT Area:     3.14 cm  RIGHT VENTRICLE RV Basal diam:  3.00 cm RV Mid diam:    2.80 cm RV S prime:     12.90 cm/s LEFT ATRIUM             Index        RIGHT ATRIUM           Index LA diam:        2.50 cm 1.24 cm/m   RA Area:     12.10 cm LA Vol (A2C):   30.9 ml 15.32 ml/m  RA Volume:   26.30 ml  13.04 ml/m LA Vol (A4C):   21.9 ml 10.86 ml/m LA Biplane Vol: 26.9 ml 13.34 ml/m  AORTIC VALVE AV Area (Vmax):    2.70 cm AV Area (Vmean):   2.30 cm AV Area (VTI):     2.74 cm AV Vmax:           142.00 cm/s AV Vmean:          98.000 cm/s AV VTI:            0.229 m AV Peak Grad:      8.1 mmHg AV Mean Grad:      4.0 mmHg LVOT Vmax:         122.00 cm/s LVOT Vmean:        71.700 cm/s LVOT VTI:          0.200 m LVOT/AV VTI ratio: 0.87  AORTA Ao Root diam: 3.30 cm MITRAL VALVE               TRICUSPID VALVE MV Area (PHT): 4.89 cm    TR Peak grad:   19.9 mmHg MV Area VTI:   3.24  cm    TR Vmax:        223.00 cm/s MV Peak grad:  4.2 mmHg MV Mean grad:  3.0 mmHg    SHUNTS MV Vmax:       1.03 m/s    Systemic VTI:  0.20 m MV Vmean:      81.5 cm/s   Systemic Diam: 2.00 cm MV Decel Time: 155 msec MV E velocity: 69.70 cm/s MV A velocity: 98.50 cm/s MV E/A ratio:  0.71 Belva Boyden MD Electronically signed by Belva Boyden MD Signature Date/Time: 04/09/2024/4:33:26 PM    Final      Scheduled Meds:  antiseptic oral rinse  15 mL Mouth Rinse QPC supper   atovaquone   1,500 mg Oral Q breakfast   bictegravir-emtricitabine -tenofovir  AF  1 tablet Oral Daily  chlorhexidine   15 mL Mouth/Throat BID   DULoxetine   60 mg Oral Daily   enoxaparin  (LOVENOX ) injection  40 mg Subcutaneous QHS   folic acid   1 mg Oral Daily   hydrocerin   Topical BID   lidocaine   1 patch Transdermal Q24H   lipase/protease/amylase  24,000 Units Oral TID AC   multivitamin with minerals  1 tablet Oral QHS   pantoprazole   40 mg Oral BID   senna-docusate  2 tablet Oral BID   thiamine   100 mg Oral Daily   torsemide  40 mg Oral BID   Followed by   Cecily Cohen ON 04/10/2024] torsemide  40 mg Oral Daily   Continuous Infusions:     LOS: 123 days     Althia Atlas, MD [facility] Pager [phone]  If 7PM-7AM, please contact night-coverage [email or web address] 04/09/2024, 4:44 PM

## 2024-04-09 NOTE — Progress Notes (Signed)
 Date of Admission:  12/08/2023     ID: Meghan Jarvis is a 48 y.o. 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 GUADALUPE MCFARLANE is a 48 y.o. with a history of AIDS, not compliant with meds or visits ,  , cocaine use was brought in by EMS after being found unresponsive on the couch  in an aquaintance place-   Subjective:pt is getting bedside 2 d echo She is happy Her boy friend is at bed side   Medications:   antiseptic oral rinse  15 mL Mouth Rinse QPC supper   atovaquone   1,500 mg Oral Q breakfast   bictegravir-emtricitabine -tenofovir  AF  1 tablet Oral Daily   chlorhexidine   15 mL Mouth/Throat BID   DULoxetine   60 mg Oral Daily   enoxaparin  (LOVENOX ) injection  40 mg Subcutaneous QHS   folic acid   1 mg Oral Daily   hydrocerin   Topical BID   lidocaine   1 patch Transdermal Q24H   lipase/protease/amylase  24,000 Units Oral TID AC   multivitamin with minerals  1 tablet Oral QHS   pantoprazole   40 mg Oral BID   senna-docusate  2 tablet Oral BID   thiamine   100 mg Oral Daily   torsemide  40 mg Oral BID    Objective: Vital signs in last 24 hours: Patient Vitals for the past 24 hrs:  BP Temp Temp src Pulse Resp SpO2 Weight  04/09/24 0756 127/85 -- -- 100 15 99 % --  04/09/24 0541 (!) 109/94 99 F (37.2 C) Oral 98 20 99 % --  04/09/24 0500 -- -- -- -- -- -- 87.9 kg  04/09/24 0435 109/79 98.1 F (36.7 C) Oral 95 18 97 % --  04/08/24 2047 116/78 98.3 F (36.8 C) Oral (!) 102 18 98 % --  04/08/24 1549 (!) 128/95 98.6 F (37 C) Oral (!) 103 18 99 % --  Awake and alert Hss1s2 Lungs clear to auscultation CNS grossly non focal  Examination of MS[facility]- restricted and painful movt rt  hip  Lab Results    Latest Ref Rng & Units 04/09/2024    5:47 AM 04/03/2024    4:40 AM 04/01/2024    6:11 AM  CBC  WBC 4.0 - 10.5 K/uL 4.5  4.4  4.6   Hemoglobin 12.0 - 15.0 g/dL 11.9  9.5  9.7   Hematocrit 36.0 - 46.0 % 30.3  30.1  30.4   Platelets 150 - 400 K/uL 315  295  298        Latest Ref Rng & Units 04/09/2024    5:47 AM 04/08/2024    3:01 PM 04/03/2024    4:40 AM  CMP  Glucose 70 - 99 mg/dL 96  147  829   BUN 6 - 20 mg/dL 14  14  14    Creatinine 0.44 - 1.00 mg/dL 5.62  1.30  8.65   Sodium 135 - 145 mmol/L 140  140  138   Potassium 3.5 - 5.1 mmol/L 3.8  3.6  3.8   Chloride 98 - 111 mmol/L 103  106  107   CO2 22 - 32 mmol/L 28  24  25    Calcium  8.9 - 10.3 mg/dL 9.5  9.0  9.2       Microbiology: 12/28 BC NG  Toxo IgG > 400 Toxo PCR neg Toxo igM neg RPR NR CMV DNA neg Crypto neg HIV RNA 2 million>> 34, 000 Cd4 is 14 ( 2.4%) repeat on 1/29 is 416 ( 23%) Beta D glucan > 500 (  repeated) Histoplasma neg Fungal antibodies negative Genosure prime- no resistant mutations QuantiFERON gold indeterminate.  No treatment needed now.  Can repeat it later.   Sed rate 35 ( 03/27/24) CRP CRP 0.7   Radiology MRI hip rt done on 02/25/24    Reviewed personally     Intramuscular edema and enhancement within the right gluteus medius and minimus muscles with a central area of non-enhancement in the posterior right gluteus minimus muscle adjacent to the posterior acetabular rim. Similar findings are present at the lateral aspect of the right gluteus maximus muscle adjacent to the greater trochanter. 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 No fluid signal at these locations to suggest abscess formation.   Assessment/Plan:  AIDS- - not been in care since 2021-  Was on Biktarvy  at one time and was non compliant then VL  on admission was 2 million and cd4 is 11  Started Biktarvy  on 12/14/23 .  Genotype no resistance to NRTI, NNRTI,  Integrase inhibitor and PI Repeat Vl showed a significant drop in 3 weeks from 2 million to 34,000 . Latest vl is 60 and CD4 is 416 ( 18.8%) from 03/18/24 Continue atovaquone  for toxo and PCP prophylaxis  Significant weight gain 108>>189 pounds in 4 months- combination of not using cocaine, starting of HAART ( Biktarvy ), snacking in the hospital Seen by nutritionist    Persistent Rt thigh, rt hip pain MRI shows myositis , myonecrosis of the gluteus medius , minimus Discussed with [facility] radiologist -moderate Osteoarthritis of rt hip.  Small muscle tear vs inflammation rt glutues  She was assesed by ortho and no intervention recommended   Encephalopathy  has resolved ( Likely related to cocaine use on presentation)  Aspiration leading to acute hypoxic respiratory failure with right middle lobe and lower lobe consolidation in Dec 2024 .  Status post intubation and was mechanically ventilated in the early part of the admission.  treated     Rash- resolved likely was due to bactrim - changed to Atovaquone - Mepron       Pruritus with excoriations and hyperpigmentiaon resolved ? Bactrim  related rash on baseline dry and excoriated skin    No evidence of IRIS   Toxo IgG very high-  MRI brain no CNS lesions  . TOXO PCR negative On  PCP/TOXO  and MAI prophylaxis. Was On bactrim  for PCP and Toxo prophylaxis- because of new rash concerning for bactrim  allergy was changed to atovaquone  Rash resolved  Beta D glucan high > 500 (fungal antibodies negative-  Normalized on repeat testing     Active cocaine use  ETOH abuse    Anemia   leucopenia/thrombocytopenia could be due to AIDS ETOH abuse   resolved  CMV DNA neg , AFB blood culture sent-NG     HIV associated neurocognitive disorder compounded by substance use with no insight in her medical condition     She has no capacity , and waiting for placement- DSS involved   Discussed the management with patient and her partner ID  will follow her peripherally .

## 2024-04-09 NOTE — Progress Notes (Signed)
*  PRELIMINARY RESULTS* Echocardiogram 2D Echocardiogram has been performed.  Broadus Canes 04/09/2024, 2:53 PM

## 2024-04-09 NOTE — Plan of Care (Signed)
   Problem: Coping: Goal: Ability to adjust to condition or change in health will improve Outcome: Progressing   Problem: Fluid Volume: Goal: Ability to maintain a balanced intake and output will improve Outcome: Progressing   Problem: Health Behavior/Discharge Planning: Goal: Ability to identify and utilize available resources and services will improve Outcome: Progressing

## 2024-04-09 NOTE — Plan of Care (Signed)
  Problem: Coping: Goal: Ability to adjust to condition or change in health will improve Outcome: Progressing   Problem: Fluid Volume: Goal: Ability to maintain a balanced intake and output will improve Outcome: Progressing   Problem: Health Behavior/Discharge Planning: Goal: Ability to identify and utilize available resources and services will improve Outcome: Progressing Goal: Ability to manage health-related needs will improve Outcome: Progressing   Problem: Metabolic: Goal: Ability to maintain appropriate glucose levels will improve Outcome: Progressing   Problem: Nutritional: Goal: Maintenance of adequate nutrition will improve Outcome: Progressing Goal: Progress toward achieving an optimal weight will improve Outcome: Progressing   Problem: Skin Integrity: Goal: Risk for impaired skin integrity will decrease Outcome: Progressing   Problem: Tissue Perfusion: Goal: Adequacy of tissue perfusion will improve Outcome: Progressing   Problem: Education: Goal: Knowledge of General Education information will improve Description: Including pain rating scale, medication(s)/side effects and non-pharmacologic comfort measures Outcome: Progressing   Problem: Health Behavior/Discharge Planning: Goal: Ability to manage health-related needs will improve Outcome: Progressing   Problem: Clinical Measurements: Goal: Ability to maintain clinical measurements within normal limits will improve Outcome: Progressing Goal: Will remain free from infection Outcome: Progressing Goal: Diagnostic test results will improve Outcome: Progressing Goal: Respiratory complications will improve Outcome: Progressing Goal: Cardiovascular complication will be avoided Outcome: Progressing

## 2024-04-10 DIAGNOSIS — F02818 Dementia in other diseases classified elsewhere, unspecified severity, with other behavioral disturbance: Secondary | ICD-10-CM | POA: Diagnosis not present

## 2024-04-10 DIAGNOSIS — B20 Human immunodeficiency virus [HIV] disease: Secondary | ICD-10-CM | POA: Diagnosis not present

## 2024-04-10 LAB — BASIC METABOLIC PANEL WITH GFR
Anion gap: 7 (ref 5–15)
BUN: 22 mg/dL — ABNORMAL HIGH (ref 6–20)
CO2: 31 mmol/L (ref 22–32)
Calcium: 9.1 mg/dL (ref 8.9–10.3)
Chloride: 100 mmol/L (ref 98–111)
Creatinine, Ser: 0.95 mg/dL (ref 0.44–1.00)
GFR, Estimated: >60 mL/min (ref >60)
Glucose, Bld: 125 mg/dL — ABNORMAL HIGH (ref 70–99)
Potassium: 3.3 mmol/L — ABNORMAL LOW (ref 3.5–5.1)
Sodium: 138 mmol/L (ref 135–145)

## 2024-04-10 MED ORDER — POTASSIUM CHLORIDE CRYS ER 20 MEQ PO TBCR
60.0000 meq | EXTENDED_RELEASE_TABLET | Freq: Once | ORAL | Status: AC
  Administered 2024-04-10: 60 meq via ORAL
  Filled 2024-04-10: qty 3

## 2024-04-10 NOTE — Plan of Care (Signed)
 Nutrition Education Note  RD consulted for nutrition education regarding weight loss.  48 y/o female with h/o homelessness, HIV, substance abuse, Afib, mood disorder, hypothyroidism, type 2 diabetes mellitus and depression who was originally admitted with aspiration PNA back in December but has remained hospitalized r/t no safe discharge plan; pt is awaiting guardianship.   Pt diagnosed with severe malnutrition on admission. Pt had lost ~70lbs(35%) over the previous year prior to her admit. Since admission, pt has regained ~70lbs and appears to be back around her UBW. MD concerned about pt's rapid weight gain. On exam today, malnutrition appears resolved with only mild muscle depletions around pt's clavicles and temporal region.     Met with pt in room today. Pt's daughter is at bedside. Pt reports good appetite and oral intake in hospital. Pt is documented to be eating 100% of most meals. Pt reports that she is eating 3 meals per day and no snacks or food brought from outside the hospital. Per dietary recall from [facility], pt has ordered 2 pancakes, sausage patty, grits, scrambled eggs and soda for breakfast, lunch and dinner over the past 3 days; this provides 4,434kcal/day and 90g/day protein.  RD discussed with pt regarding her weight gain. Discussed the importance of avoiding any further weight gain. Pt reports that she rarely leaves the room or moves around secondary to leg pain. Pt reports today that she is not worried about her weight gain. Pt unset that MD told her not to drink soda. RD provided diet education focusing mainly on limiting "junk" foods that dont provide any nutritional value and to focus on high protein options. Pt reports that she isn't having nobody tell her what she can and can't eat.   Spoke with MD (Dr. Hubert Madden), will place pt on a calorie restricted diet of 2000kcal/day per his request. RD suspects some push back from patient when ordering meals.   No further nutrition  interventions needed at this time. Please re-consult if further assistance is needed.   Torrance Freestone MS, RD, LDN If unable to be reached, please send secure chat to "RD inpatient" available from 8:00a-4:00p daily

## 2024-04-10 NOTE — TOC Progression Note (Addendum)
 Transition of Care [facility]) - Progression Note    Patient Details  Name: Meghan Jarvis MRN: [id number] Date of Birth: June 05, 1976  [facility]) CM/SW Contact  Odilia Bennett, LCSW Phone Number: 04/10/2024, 11:24 AM  Clinical Narrative:  Emailed APS supervisor and social worker to see if there were any updates regarding court date.   1:35 pm: Per APS supervisor, "I signed the legal documents yesterday. We are asking for an interim  hearing so we can get in faster. I will let you know once we have it."  2:37 pm: CSW contacted [facility]. She will reach out to her coworker regarding helping to find group home/ALF placement.  Expected Discharge Plan and Services         Expected Discharge Date: 01/10/24                                     Social Determinants of Health (SDOH) Interventions SDOH Screenings   Food Insecurity: Patient Unable To Answer (12/09/2023)  Recent Concern: Food Insecurity - Food Insecurity Present (09/26/2023)  Housing: Patient Unable To Answer (12/09/2023)  Recent Concern: Housing - Medium Risk (09/26/2023)  Transportation Needs: Patient Unable To Answer (12/09/2023)  Recent Concern: Transportation Needs - Unmet Transportation Needs (10/11/2023)  Utilities: Patient Unable To Answer (12/09/2023)  Recent Concern: Utilities - At Risk (09/25/2023)  Tobacco Use: High Risk (12/08/2023)    Readmission Risk Interventions     No data to display

## 2024-04-10 NOTE — Plan of Care (Signed)
  Problem: Coping: Goal: Ability to adjust to condition or change in health will improve Outcome: Progressing   Problem: Fluid Volume: Goal: Ability to maintain a balanced intake and output will improve Outcome: Progressing   Problem: Health Behavior/Discharge Planning: Goal: Ability to identify and utilize available resources and services will improve Outcome: Progressing Goal: Ability to manage health-related needs will improve Outcome: Progressing

## 2024-04-10 NOTE — Progress Notes (Signed)
 PROGRESS NOTE   ORI EHRESMAN  BJY:[id number] DOB: 01-Oct-1976 DOA: 12/08/2023 [name]    HPI was taken from NP [name]: 48 y.o with significant PMH of EtOH abuse ,Cocaine use, Homelessness, Uncontrolled HIV, on BIKTARVY , A-fib, GERD, Hypothyroidism, Diabetes, who presented to the ED with chief complaints of unresponsiveness.   Per ED reports, EMS report that patient was boarding at a friend's house who noticed that she had not woken up the whole day. Patient's friend attempted to wake her up but noticed that she was unresponsive and making "gurgling" sound so they called EMS. On EMS arrival, patient was found to be unconscious and incontinent of urine and feces.     ED Course: Initial vital signs showed HR of 118 beats/minute, BP 90/60 mm Hg, the RR 25 breaths/minute, and the oxygen saturation 98% on NRB and a temperature of 100.62F. Patient was obtunded with no gag reflex with debris around the mouth and nose concerning for stomach contents. She was emergently intubated for airway protection. Pertinent Labs/Diagnostics Findings: Na+/ K+:140/2.6  Glucose: 117 BUN/Cr.:33/1.18 AST/ALT:45/17 WBC:9.6 K/L PCT: 2.2 Lactic acid: 1.1 COVID PCR: Negative,  ABG: pO2 139; pCO2 27; pH 7.51;  HCO3 21.5, %O2 Sat 100.  CXR> see full report  Medication administered in the ED: Patient given 30 cc/kg of fluids and started on broad-spectrum antibiotics Ceftriaxone  and Azithromycin  for suspected sepsis Disposition:ICU   As per Dr. Broadus Canes 4/16-4/22/25: Pt has remained medically stable. Still awaiting guardianship hearing date. 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 twice daily, x 3 doses given followed by torsemide  40 mg p.o. daily from 5/1 BNP 70.7 wnl  Tte unremarkable No appreciable edema on exam today Will hold additional torsemide   HIV: w/ progression to AIDS. Poor historian and does not have capacity to make decisions as per psych. Still waiting on guardianship hearing date. Continue on biktarvy . Continue on atovaquone  for PCP ppx  HIV associated neurocognitive disorder: MRI did not show any enhancing lesions but did show severe cerebral atrophy likely secondary to HIV/AIDS.    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 Continue w/ conservative management   Diarrhea: Resolved    Hypotension: resolved. Holding midodrine    Poor dental hygiene, one left maxillary tooth fell off on 4/23 Started oral rinse nightly for 2 weeks  Depression due to prolonged stay in the hospital and unable to see significant other. Psych consulted for further recommendation.   Overweight, BMI 30 Significant weight gain during hospital stay Nutritionist consulted  Social issues: homeless and has polysubstance use disorder (alcohol, cocaine).  Pt's boyfriend/fiance' is not able to visit the pt anymore b/c concern of him bringing the pt alcohol and/or illicit drugs. Still awaiting guardianship. No safe d/c plan      DVT prophylaxis: lovenox  Code Status: full  Family Communication: daughter at bedside 5/1 Disposition Plan: awaiting guardianship still. Unsafe d/c plan   Level of care: Med-Surg Consultants:  Psych  Ortho surg  ID  Procedures:   Subjective: Reports stable right hip pain, no other complaints   Objective:  Vitals:   04/09/24 2033 04/09/24 2044 04/10/24 0347 04/10/24 0754  BP: 125/84 102/75 103/70 116/85  Pulse: 94 97 97 99  Resp: 16 17 16 17   Temp: 98.5 F (36.9 C) 98.2 F (36.8 C) 98.1 F (36.7 C) 98.2 F (36.8 C)   TempSrc:  Oral Oral Oral  SpO2: 98% 98% 99% 99%  Weight:      Height:        Intake/Output Summary (Last 24 hours) at 04/10/2024 1542 Last data filed at 04/10/2024 1100 Gross per 24 hour  Intake 0 ml  Output --  Net 0 ml    Filed Weights   04/04/24 0500 04/05/24 0407 04/09/24 0500  Weight: 89.1 kg 89.9 kg 87.9 kg    Examination:  General exam: Appears calm & comfortable  Respiratory system: clear breath sounds b/l  Cardiovascular system: S1 & S2+. No rubs or gallops   Gastrointestinal system: abd is soft, NT, ND & hypoactive bowel sounds  Central nervous system: alert & awake. Moves all extremities  Psychiatry: judgement and insight appears poor. Flat mood and affect Extremities: 1-2+ pedal edema, no calf tenderness.  Edema improved   Data Reviewed: I have personally reviewed following labs and imaging studies  CBC: Recent Labs  Lab 04/09/24 0547  WBC 4.5  HGB 10.0*  HCT 30.3*  MCV 81.5  PLT 315   Basic Metabolic Panel: Recent Labs  Lab 04/08/24 1501 04/09/24 0547 04/10/24 0543  NA 140 140 138  K 3.6 3.8 3.3*  CL 106 103 100  CO2 24 28 31   GLUCOSE 128* 96 125*  BUN 14 14 22*  CREATININE 0.73 0.75 0.95  CALCIUM  9.0 9.5 9.1   GFR: Estimated Creatinine Clearance: 83.1 mL/min (by C-G formula based on SCr of 0.95 mg/dL). Liver Function Tests: Recent Labs  Lab 04/09/24 0547  AST 29  ALT 23  ALKPHOS 57  BILITOT 0.4  PROT 7.5  ALBUMIN 3.4*   No results for input(s): "LIPASE", "AMYLASE" in the last 168 hours. No results for input(s): "AMMONIA" in the last 168 hours. Coagulation Profile: No results for input(s): "INR", "PROTIME" in the last 168 hours. Cardiac Enzymes: No results for input(s): "CKTOTAL", "CKMB", "CKMBINDEX", "TROPONINI" in the last 168 hours. BNP (last 3 results) No results for input(s): "PROBNP" in the last 8760 hours. HbA1C: No results for input(s): "HGBA1C" in the last 72 hours. CBG: No results for input(s): "GLUCAP" in the last  168 hours. Lipid Profile: No results for input(s): "CHOL", "HDL", "LDLCALC", "TRIG", "CHOLHDL", "LDLDIRECT" in the last 72 hours. Thyroid Function Tests: No results for input(s): "TSH", "T4TOTAL", "FREET4", "T3FREE", "THYROIDAB" in the last 72 hours. Anemia Panel: No results for input(s): "VITAMINB12", "FOLATE", "FERRITIN", "TIBC", "IRON", "RETICCTPCT" in the last 72 hours. Sepsis Labs: No results for input(s): "PROCALCITON", "LATICACIDVEN" in the last 168 hours.  No results found for this or any previous visit (from the past 240 hours).    Radiology Studies: ECHOCARDIOGRAM COMPLETE Result Date: 04/09/2024    ECHOCARDIOGRAM REPORT   Patient Name:   DIAMONDNIQUE TIPPEN Date of Exam: 04/09/2024 Medical Rec #:  [id number]       Height:       68.0 in Accession #:    [id number]      Weight:       193.8 lb Date of Birth:  January 04, 1976       BSA:          2.017 m Patient Age:    65 years  BP:           127/85 mmHg Patient Gender: F               HR:           100 bpm. Exam Location:  [facility] Procedure: 2D Echo, Cardiac Doppler and Color Doppler (Both Spectral and Color            Flow Doppler were utilized during procedure). Indications:     CHF-acute diastolic I50.31  History:         Patient has prior history of Echocardiogram examinations, most                  recent 05/11/2020. No cardiac history listed in chart.  Sonographer:     Broadus Canes Referring Phys:  [id number] Althia Atlas Diagnosing Phys: Belva Boyden MD IMPRESSIONS  1. Left ventricular ejection fraction, by estimation, is 55 to 60%. The left ventricle has normal function. The left ventricle has no regional wall motion abnormalities. Left ventricular diastolic parameters are consistent with Grade I diastolic dysfunction (impaired relaxation).  2. Right ventricular systolic function is normal. The right ventricular size is normal. There is normal pulmonary artery systolic pressure. The estimated right ventricular systolic pressure is 24.9 mmHg.  3. The  mitral valve is normal in structure. No evidence of mitral valve regurgitation. No evidence of mitral stenosis.  4. The aortic valve is normal in structure. Aortic valve regurgitation is not visualized. No aortic stenosis is present.  5. The inferior vena cava is normal in size with greater than 50% respiratory variability, suggesting right atrial pressure of 3 mmHg. FINDINGS  Left Ventricle: Left ventricular ejection fraction, by estimation, is 55 to 60%. The left ventricle has normal function. The left ventricle has no regional wall motion abnormalities. Strain was performed and the global longitudinal strain is indeterminate. The left ventricular internal cavity size was normal in size. There is no left ventricular hypertrophy. Left ventricular diastolic parameters are consistent with Grade I diastolic dysfunction (impaired relaxation). Right Ventricle: The right ventricular size is normal. No increase in right ventricular wall thickness. Right ventricular systolic function is normal. There is normal pulmonary artery systolic pressure. The tricuspid regurgitant velocity is 2.23 m/s, and  with an assumed right atrial pressure of 5 mmHg, the estimated right ventricular systolic pressure is 24.9 mmHg. Left Atrium: Left atrial size was normal in size. Right Atrium: Right atrial size was normal in size. Pericardium: There is no evidence of pericardial effusion. Mitral Valve: The mitral valve is normal in structure. Mild mitral annular calcification. No evidence of mitral valve regurgitation. No evidence of mitral valve stenosis. MV peak gradient, 4.2 mmHg. The mean mitral valve gradient is 3.0 mmHg. Tricuspid Valve: The tricuspid valve is normal in structure. Tricuspid valve regurgitation is mild . No evidence of tricuspid stenosis. Aortic Valve: The aortic valve is normal in structure. Aortic valve regurgitation is not visualized. No aortic stenosis is present. Aortic valve mean gradient measures 4.0 mmHg. Aortic  valve peak gradient measures 8.1 mmHg. Aortic valve area, by VTI measures 2.74 cm. Pulmonic Valve: The pulmonic valve was normal in structure. Pulmonic valve regurgitation is not visualized. No evidence of pulmonic stenosis. Aorta: The aortic root is normal in size and structure. Venous: The inferior vena cava is normal in size with greater than 50% respiratory variability, suggesting right atrial pressure of 3 mmHg. IAS/Shunts: No atrial level shunt detected by color flow Doppler. Additional Comments: 3D was performed not requiring  image post processing on an independent workstation and was indeterminate.  LEFT VENTRICLE PLAX 2D LVIDd:         4.80 cm   Diastology LVIDs:         3.20 cm   LV e' medial:    10.30 cm/s LV PW:         0.90 cm   LV E/e' medial:  6.8 LV IVS:        1.30 cm   LV e' lateral:   11.90 cm/s LVOT diam:     2.00 cm   LV E/e' lateral: 5.9 LV SV:         63 LV SV Index:   31 LVOT Area:     3.14 cm  RIGHT VENTRICLE RV Basal diam:  3.00 cm RV Mid diam:    2.80 cm RV S prime:     12.90 cm/s LEFT ATRIUM             Index        RIGHT ATRIUM           Index LA diam:        2.50 cm 1.24 cm/m   RA Area:     12.10 cm LA Vol (A2C):   30.9 ml 15.32 ml/m  RA Volume:   26.30 ml  13.04 ml/m LA Vol (A4C):   21.9 ml 10.86 ml/m LA Biplane Vol: 26.9 ml 13.34 ml/m  AORTIC VALVE AV Area (Vmax):    2.70 cm AV Area (Vmean):   2.30 cm AV Area (VTI):     2.74 cm AV Vmax:           142.00 cm/s AV Vmean:          98.000 cm/s AV VTI:            0.229 m AV Peak Grad:      8.1 mmHg AV Mean Grad:      4.0 mmHg LVOT Vmax:         122.00 cm/s LVOT Vmean:        71.700 cm/s LVOT VTI:          0.200 m LVOT/AV VTI ratio: 0.87  AORTA Ao Root diam: 3.30 cm MITRAL VALVE               TRICUSPID VALVE MV Area (PHT): 4.89 cm    TR Peak grad:   19.9 mmHg MV Area VTI:   3.24 cm    TR Vmax:        223.00 cm/s MV Peak grad:  4.2 mmHg MV Mean grad:  3.0 mmHg    SHUNTS MV Vmax:       1.03 m/s    Systemic VTI:  0.20 m MV Vmean:       81.5 cm/s   Systemic Diam: 2.00 cm MV Decel Time: 155 msec MV E velocity: 69.70 cm/s MV A velocity: 98.50 cm/s MV E/A ratio:  0.71 Belva Boyden MD Electronically signed by Belva Boyden MD Signature Date/Time: 04/09/2024/4:33:26 PM    Final      Scheduled Meds:  antiseptic oral rinse  15 mL Mouth Rinse QPC supper   atovaquone   1,500 mg Oral Q breakfast   bictegravir-emtricitabine -tenofovir  AF  1 tablet Oral Daily   chlorhexidine   15 mL Mouth/Throat BID   DULoxetine   60 mg Oral Daily   enoxaparin  (LOVENOX ) injection  40 mg Subcutaneous QHS   folic acid   1 mg Oral Daily   hydrocerin  Topical BID   lidocaine   1 patch Transdermal Q24H   lipase/protease/amylase  24,000 Units Oral TID AC   multivitamin with minerals  1 tablet Oral QHS   pantoprazole   40 mg Oral BID   potassium chloride   60 mEq Oral Once   senna-docusate  2 tablet Oral BID   thiamine   100 mg Oral Daily   Continuous Infusions:     LOS: 124 days     Raymonde Calico, MD [facility]  If 7PM-7AM, please contact night-coverage [email or web address] 04/10/2024, 3:42 PM

## 2024-04-11 DIAGNOSIS — B20 Human immunodeficiency virus [HIV] disease: Secondary | ICD-10-CM | POA: Diagnosis not present

## 2024-04-11 DIAGNOSIS — F02818 Dementia in other diseases classified elsewhere, unspecified severity, with other behavioral disturbance: Secondary | ICD-10-CM | POA: Diagnosis not present

## 2024-04-11 LAB — BASIC METABOLIC PANEL WITH GFR
Anion gap: 9 (ref 5–15)
BUN: 20 mg/dL (ref 6–20)
CO2: 27 mmol/L (ref 22–32)
Calcium: 9.7 mg/dL (ref 8.9–10.3)
Chloride: 100 mmol/L (ref 98–111)
Creatinine, Ser: 0.89 mg/dL (ref 0.44–1.00)
GFR, Estimated: >60 mL/min (ref >60)
Glucose, Bld: 110 mg/dL — ABNORMAL HIGH (ref 70–99)
Potassium: 4 mmol/L (ref 3.5–5.1)
Sodium: 136 mmol/L (ref 135–145)

## 2024-04-11 NOTE — NC FL2 (Addendum)
 [place]  MEDICAID [id number] LEVEL OF CARE FORM     IDENTIFICATION  Patient Name: Meghan Jarvis Birthdate: Jul 31, 1976 Sex: female Admission Date (Current Location): 12/08/2023  [facility] and [place] Number:  Chiropodist and Address:  [facility], [address] [place], [place] [address]      Provider Number: [id number]  Attending Physician Name and Address:  Althia Atlas, MD  Relative Name and Phone Number:      Current Level of Care: Hospital Recommended Level of Care: [facility] Prior Approval Number:    Date Approved/Denied:   PASRR Number: [id number]  Discharge Plan: Other (Comment) [facility])    Current Diagnoses: Patient Active Problem List   Diagnosis Date Noted   Protein-calorie malnutrition, severe 03/23/2024   Encounter for assessment of decision-making capacity 03/22/2024   AIDS (acquired immune deficiency syndrome) (HCC) 02/21/2024   Right upper quadrant abdominal pain 12/14/2023   Cocaine abuse (HCC) 12/12/2023   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 Fluid restriction 1500 mL.)  AMBULATORY STATUS COMMUNICATION OF NEEDS Skin   Limited Assist Verbally Skin abrasions, Bruising, Other (Comment) (Blister, erythema/redness, rash, scratch marks.)                       Personal Care Assistance Level of Assistance  Bathing,  Feeding, Dressing Bathing Assistance: Limited assistance Feeding assistance: Limited assistance Dressing Assistance: Limited assistance     Functional Limitations Info  Sight, Hearing, Speech Sight Info: Adequate Hearing Info: Adequate Speech Info: Adequate    SPECIAL CARE FACTORS FREQUENCY                  Contractures Contractures Info: Not present    Additional Factors Info  Code Status, Allergies Code Status Info: Full code Allergies Info: Fish Allergy, Shellfish Allergy, Buprenorphine Hcl, Morphine And Codeine, Sulfa  Antibiotics          Current Medications (04/11/2024):  This is the current hospital active medication list Current Facility-Administered Medications  Medication Dose Route Frequency Provider Last Rate Last Admin   acetaminophen  (TYLENOL ) tablet 650 mg  650 mg Oral Q6H PRN Sheril Dines, MD   650 mg at 04/09/24 1708   antiseptic oral rinse (BIOTENE) solution 15 mL  15 mL Mouth Rinse QPC supper Althia Atlas, MD   15 mL at 04/10/24 1728   atovaquone  (MEPRON ) 750 MG/5ML suspension 1,500 mg  1,500 mg Oral Q breakfast Alica Inks, MD   1,500 mg at 04/11/24 1101   bictegravir-emtricitabine -tenofovir  AF (BIKTARVY ) 50-200-25 MG per tablet 1 tablet  1 tablet Oral Daily Alica Inks, MD   1 tablet at 04/11/24 1101   chlorhexidine  (PERIDEX ) 0.12 % solution 15 mL  15 mL Mouth/Throat BID Elisabeth Guild, NP   15 mL at 04/11/24 1100   cyclobenzaprine  (FLEXERIL ) tablet 5 mg  5 mg Oral TID PRN Verla Glaze, MD   5 mg at 04/09/24 [phone]  dicyclomine  (BENTYL ) tablet 20 mg  20 mg Oral TID PRN Alexander, Natalie, DO   20 mg at 03/02/24 2131   diphenhydrAMINE  (BENADRYL ) capsule 25 mg  25 mg Oral Q8H PRN Dezii, Alexandra, DO   25 mg at 04/06/24 2221   DULoxetine  (CYMBALTA ) DR capsule 60 mg  60 mg Oral Daily Byungura, Veronique M, NP   60 mg at 04/11/24 1101   enoxaparin  (LOVENOX ) injection 40 mg  40 mg Subcutaneous QHS Aleskerov, Fuad, MD   40 mg at  04/10/24 2115   folic acid  (FOLVITE ) tablet 1 mg  1 mg Oral Daily Aleskerov, Fuad, MD   1 mg at 04/11/24 1101   hydrocerin (EUCERIN) cream   Topical BID Verla Glaze, MD   Given at 04/11/24 1102   hydrocortisone  (ANUSOL -HC) 2.5 % rectal cream   Rectal QID PRN Alexander, Natalie, DO   Given at 04/04/24 1122   hydrOXYzine  (ATARAX ) tablet 25 mg  25 mg Oral TID PRN Byungura, Veronique M, NP       ibuprofen  (ADVIL ) tablet 400 mg  400 mg Oral Q6H PRN Sheril Dines, MD   400 mg at 04/09/24 0909   ipratropium-albuterol  (DUONEB) 0.5-2.5 (3) MG/3ML nebulizer solution 3 mL  3 mL Nebulization Q6H PRN Verla Glaze, MD       lactulose  (CHRONULAC ) 10 GM/15ML solution 20 g  20 g Oral Daily PRN Amin, Sumayya, MD       lidocaine  (LIDODERM ) 5 % 1 patch  1 patch Transdermal Q24H Verla Glaze, MD   1 patch at 04/10/24 1728   lipase/protease/amylase (CREON ) capsule 24,000 Units  24,000 Units Oral TID AC Zhang, Dekui, MD   24,000 Units at 04/11/24 1101   loperamide  (IMODIUM ) capsule 4 mg  4 mg Oral PRN Alexander, Natalie, DO   4 mg at 02/17/24 1833   multivitamin with minerals tablet 1 tablet  1 tablet Oral QHS Zeigler, Dustin G, RPH   1 tablet at 04/10/24 2115   nicotine  polacrilex (NICORETTE ) gum 2 mg  2 mg Oral PRN Elisabeth Guild, NP       ondansetron  (ZOFRAN -ODT) disintegrating tablet 4 mg  4 mg Oral Q6H PRN Ezzard Holms, MD   4 mg at 03/09/24 6962   Oral care mouth rinse  15 mL Mouth Rinse PRN Lorita Rosa, MD       Oral care mouth rinse  15 mL Mouth Rinse PRN Elisabeth Guild, NP       pantoprazole  (PROTONIX ) EC tablet 40 mg  40 mg Oral BID Verla Glaze, MD   40 mg at 04/11/24 1101   polyethylene glycol (MIRALAX  / GLYCOLAX ) packet 17 g  17 g Oral Daily PRN Verla Glaze, MD   17 g at 01/28/24 9528   senna-docusate (Senokot-S) tablet 2 tablet  2 tablet Oral BID Donaciano Frizzle, MD   2 tablet at 04/11/24 1101   thiamine  (VITAMIN B1) tablet 100 mg  100 mg Oral Daily Aleskerov, Fuad, MD   100  mg at 04/11/24 1101     Discharge Medications: Please see discharge summary for a list of discharge medications.  Relevant Imaging Results:  Relevant Lab Results:   Additional Information SS#: [id number]  Meghan Bennett, LCSW

## 2024-04-11 NOTE — Plan of Care (Signed)
  Problem: Coping: Goal: Ability to adjust to condition or change in health will improve Outcome: Progressing   Problem: Fluid Volume: Goal: Ability to maintain a balanced intake and output will improve Outcome: Progressing   Problem: Health Behavior/Discharge Planning: Goal: Ability to identify and utilize available resources and services will improve Outcome: Progressing Goal: Ability to manage health-related needs will improve Outcome: Progressing   Problem: Metabolic: Goal: Ability to maintain appropriate glucose levels will improve Outcome: Progressing   Problem: Nutritional: Goal: Maintenance of adequate nutrition will improve Outcome: Progressing Goal: Progress toward achieving an optimal weight will improve Outcome: Progressing   Problem: Skin Integrity: Goal: Risk for impaired skin integrity will decrease Outcome: Progressing   Problem: Tissue Perfusion: Goal: Adequacy of tissue perfusion will improve Outcome: Progressing   Problem: Education: Goal: Knowledge of General Education information will improve Description: Including pain rating scale, medication(s)/side effects and non-pharmacologic comfort measures Outcome: Progressing   Problem: Health Behavior/Discharge Planning: Goal: Ability to manage health-related needs will improve Outcome: Progressing   Problem: Clinical Measurements: Goal: Ability to maintain clinical measurements within normal limits will improve Outcome: Progressing Goal: Will remain free from infection Outcome: Progressing Goal: Diagnostic test results will improve Outcome: Progressing Goal: Respiratory complications will improve Outcome: Progressing Goal: Cardiovascular complication will be avoided Outcome: Progressing   Problem: Activity: Goal: Risk for activity intolerance will decrease Outcome: Progressing   Problem: Nutrition: Goal: Adequate nutrition will be maintained Outcome: Progressing   Problem: Coping: Goal:  Level of anxiety will decrease Outcome: Progressing   Problem: Elimination: Goal: Will not experience complications related to bowel motility Outcome: Progressing Goal: Will not experience complications related to urinary retention Outcome: Progressing   Problem: Pain Management: Goal: General experience of comfort will improve Outcome: Progressing   Problem: Safety: Goal: Ability to remain free from injury will improve Outcome: Progressing   Problem: Skin Integrity: Goal: Risk for impaired skin integrity will decrease Outcome: Progressing   Problem: Activity: Goal: Ability to tolerate increased activity will improve Outcome: Progressing   Problem: Respiratory: Goal: Ability to maintain a clear airway and adequate ventilation will improve Outcome: Progressing

## 2024-04-11 NOTE — TOC Progression Note (Addendum)
 Transition of Care [facility]) - Progression Note    Patient Details  Name: Meghan Jarvis MRN: [id number] Date of Birth: 19-Apr-1976  [facility]) CM/SW Contact  Odilia Bennett, LCSW Phone Number: 04/11/2024, 9:36 AM  Clinical Narrative:   Patient's court hearing is scheduled for 5/12 at 2:00 pm.  12:15 pm: CSW updated patient at bedside. CSW sent referral to local group home owner, Marcello Sero, for review. If appropriate, he's hoping to come assess her today or Monday.  Expected Discharge Plan and Services         Expected Discharge Date: 01/10/24                                     Social Determinants of Health (SDOH) Interventions SDOH Screenings   Food Insecurity: Patient Unable To Answer (12/09/2023)  Recent Concern: Food Insecurity - Food Insecurity Present (09/26/2023)  Housing: Patient Unable To Answer (12/09/2023)  Recent Concern: Housing - Medium Risk (09/26/2023)  Transportation Needs: Patient Unable To Answer (12/09/2023)  Recent Concern: Transportation Needs - Unmet Transportation Needs (10/11/2023)  Utilities: Patient Unable To Answer (12/09/2023)  Recent Concern: Utilities - At Risk (09/25/2023)  Tobacco Use: High Risk (12/08/2023)    Readmission Risk Interventions     No data to display

## 2024-04-11 NOTE — Progress Notes (Signed)
 PROGRESS NOTE   Meghan Jarvis  NGE:[id number] DOB: 1976-05-20 DOA: 12/08/2023 [name]    HPI was taken from NP [name]: 48 y.o with significant PMH of EtOH abuse ,Cocaine use, Homelessness, Uncontrolled HIV, on BIKTARVY , A-fib, GERD, Hypothyroidism, Diabetes, who presented to the ED with chief complaints of unresponsiveness.   Per ED reports, EMS report that patient was boarding at a friend's house who noticed that she had not woken up the whole day. Patient's friend attempted to wake her up but noticed that she was unresponsive and making "gurgling" sound so they called EMS. On EMS arrival, patient was found to be unconscious and incontinent of urine and feces.     ED Course: Initial vital signs showed HR of 118 beats/minute, BP 90/60 mm Hg, the RR 25 breaths/minute, and the oxygen saturation 98% on NRB and a temperature of 100.6F. Patient was obtunded with no gag reflex with debris around the mouth and nose concerning for stomach contents. She was emergently intubated for airway protection. Pertinent Labs/Diagnostics Findings: Na+/ K+:140/2.6  Glucose: 117 BUN/Cr.:33/1.18 AST/ALT:45/17 WBC:9.6 K/L PCT: 2.2 Lactic acid: 1.1 COVID PCR: Negative,  ABG: pO2 139; pCO2 27; pH 7.51;  HCO3 21.5, %O2 Sat 100.  CXR> see full report  Medication administered in the ED: Patient given 30 cc/kg of fluids and started on broad-spectrum antibiotics Ceftriaxone  and Azithromycin  for suspected sepsis Disposition:ICU   As per Dr. Broadus Canes 4/16-4/22/25: Pt has remained medically stable. Still awaiting guardianship hearing date. 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 twice daily, x 3 doses given s/p torsemide  40 mg p.o. daily, which was discontinued. BNP 70.7 wnl  Continue fluid restriction 1.5 L/day TTE LVEF 55 to 60%, grade 1 diastolic dysfunction, 5/2 edema resolved, continue to monitor off diuretics for now  HIV: w/ progression to AIDS. Poor historian and does not have capacity to make decisions as per psych. Still waiting on guardianship hearing date. Continue on biktarvy . Continue on atovaquone  for PCP ppx  HIV associated neurocognitive disorder: MRI did not show any enhancing lesions but did show severe cerebral atrophy likely secondary to HIV/AIDS.    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 Continue w/ conservative management   Diarrhea: Resolved    Hypotension: resolved. Holding midodrine    Poor dental hygiene, one left maxillary tooth fell off on 4/23 Started oral rinse nightly for 2 weeks  Depression due to prolonged stay in the hospital and unable to see significant other. Psych consulted for further recommendation.   Overweight, BMI 30 Significant weight gain during hospital stay Nutritionist consulted  Social issues: homeless and has polysubstance use disorder (alcohol, cocaine).  Pt's boyfriend/fiance' is not able to visit the pt anymore b/c concern of him bringing the pt alcohol and/or illicit drugs. Still awaiting guardianship. No safe d/c plan    DVT prophylaxis: lovenox  Code Status: full  Family Communication:  Disposition Plan: awaiting guardianship still. Unsafe d/c plan   Level of care: Med-Surg Consultants:  Psych  Ortho surg  ID  Procedures:  Antimicrobials:  Subjective: No significant events overnight, lower extremity edema almost resolved. Patient is complaining of some pain in the bilateral feet while walking.  No any other complaints.   Objective: Vitals:    04/10/24 0754 04/10/24 1557 04/11/24 0500 04/11/24 0514  BP: 116/85 107/69  106/73  Pulse: 99 (!) 110  88  Resp: 17 19  20   Temp: 98.2 F (36.8 C)   97.8 F (36.6 C)  TempSrc: Oral   Oral  SpO2: 99% 100%  98%  Weight:   88 kg 90.2 kg  Height:        Intake/Output Summary (Last 24 hours) at 04/11/2024 1534 Last data filed at 04/11/2024 1300 Gross per 24 hour  Intake 600 ml  Output --  Net 600 ml    Filed Weights   04/09/24 0500 04/11/24 0500 04/11/24 0514  Weight: 87.9 kg 88 kg 90.2 kg    Examination:  General exam: Appears calm & comfortable  Respiratory system: clear breath sounds b/l  Cardiovascular system: S1 & S2+. No rubs or gallops   Gastrointestinal system: abd is soft, NT, ND & hypoactive bowel sounds  Central nervous system: alert & awake. Moves all extremities  Psychiatry: judgement and insight appears poor. Flat mood and affect Extremities: 1-2+ pedal edema, no calf tenderness.  Edema improved   Data Reviewed: I have personally reviewed following labs and imaging studies  CBC: Recent Labs  Lab 04/09/24 0547  WBC 4.5  HGB 10.0*  HCT 30.3*  MCV 81.5  PLT 315   Basic Metabolic Panel: Recent Labs  Lab 04/08/24 1501 04/09/24 0547 04/10/24 0543 04/11/24 0539  NA 140 140 138 136  K 3.6 3.8 3.3* 4.0  CL 106 103 100 100  CO2 24 28 31 27   GLUCOSE 128* 96 125* 110*  BUN 14 14 22* 20  CREATININE 0.73 0.75 0.95 0.89  CALCIUM  9.0 9.5 9.1 9.7   GFR: Estimated Creatinine Clearance: 89.8 mL/min (by C-G formula based on SCr of 0.89 mg/dL). Liver Function Tests: Recent Labs  Lab 04/09/24 0547  AST 29  ALT 23  ALKPHOS 57  BILITOT 0.4  PROT 7.5  ALBUMIN 3.4*   No results for input(s): "LIPASE", "AMYLASE" in the last 168 hours. No results for input(s): "AMMONIA" in the last 168 hours. Coagulation Profile: No results for input(s): "INR", "PROTIME" in the last 168 hours. Cardiac Enzymes: No results for input(s): "CKTOTAL", "CKMB", "CKMBINDEX",  "TROPONINI" in the last 168 hours. BNP (last 3 results) No results for input(s): "PROBNP" in the last 8760 hours. HbA1C: No results for input(s): "HGBA1C" in the last 72 hours. CBG: No results for input(s): "GLUCAP" in the last 168 hours. Lipid Profile: No results for input(s): "CHOL", "HDL", "LDLCALC", "TRIG", "CHOLHDL", "LDLDIRECT" in the last 72 hours. Thyroid Function Tests: No results for input(s): "TSH", "T4TOTAL", "FREET4", "T3FREE", "THYROIDAB" in the last 72 hours. Anemia Panel: No results for input(s): "VITAMINB12", "FOLATE", "FERRITIN", "TIBC", "IRON", "RETICCTPCT" in the last 72 hours. Sepsis Labs: No results for input(s): "PROCALCITON", "LATICACIDVEN" in the last 168 hours.  No results found for this or any previous visit (from the past 240 hours).    Radiology Studies: No results found.    Scheduled Meds:  antiseptic oral rinse  15 mL Mouth Rinse QPC supper   atovaquone   1,500 mg Oral Q breakfast   bictegravir-emtricitabine -tenofovir  AF  1 tablet Oral Daily   chlorhexidine   15 mL Mouth/Throat BID   DULoxetine   60 mg Oral Daily   enoxaparin  (LOVENOX ) injection  40 mg Subcutaneous QHS   folic acid   1 mg Oral Daily   hydrocerin   Topical BID   lidocaine   1 patch Transdermal Q24H   lipase/protease/amylase  24,000 Units Oral TID AC   multivitamin with minerals  1 tablet Oral QHS   pantoprazole   40 mg Oral BID   senna-docusate  2 tablet Oral BID   thiamine   100 mg Oral Daily   Continuous Infusions:     LOS: 125 days     Althia Atlas, MD [facility] Pager [phone]  If 7PM-7AM, please contact night-coverage [email or web address] 04/11/2024, 3:34 PM

## 2024-04-12 DIAGNOSIS — F02818 Dementia in other diseases classified elsewhere, unspecified severity, with other behavioral disturbance: Secondary | ICD-10-CM | POA: Diagnosis not present

## 2024-04-12 DIAGNOSIS — B20 Human immunodeficiency virus [HIV] disease: Secondary | ICD-10-CM | POA: Diagnosis not present

## 2024-04-12 LAB — BASIC METABOLIC PANEL WITH GFR
Anion gap: 11 (ref 5–15)
BUN: 19 mg/dL (ref 6–20)
CO2: 26 mmol/L (ref 22–32)
Calcium: 9.3 mg/dL (ref 8.9–10.3)
Chloride: 101 mmol/L (ref 98–111)
Creatinine, Ser: 0.74 mg/dL (ref 0.44–1.00)
GFR, Estimated: >60 mL/min (ref >60)
Glucose, Bld: 124 mg/dL — ABNORMAL HIGH (ref 70–99)
Potassium: 3.5 mmol/L (ref 3.5–5.1)
Sodium: 138 mmol/L (ref 135–145)

## 2024-04-12 LAB — TROPONIN I (HIGH SENSITIVITY)
Troponin I (High Sensitivity): 4 ng/L (ref <18)
Troponin I (High Sensitivity): 4 ng/L (ref <18)

## 2024-04-12 LAB — MAGNESIUM: Magnesium: 1.9 mg/dL (ref 1.7–2.4)

## 2024-04-12 MED ORDER — ALUM & MAG HYDROXIDE-SIMETH 200-200-20 MG/5ML PO SUSP: 15.0000 mL | Freq: Four times a day (QID) | ORAL | Status: DC | PRN

## 2024-04-12 MED ORDER — NITROGLYCERIN 0.4 MG SL SUBL
0.4000 mg | SUBLINGUAL_TABLET | SUBLINGUAL | Status: DC | PRN
  Administered 2024-04-12: 0.4 mg via SUBLINGUAL
  Filled 2024-04-12: qty 1

## 2024-04-12 MED ORDER — MORPHINE SULFATE 15 MG PO TABS
15.0000 mg | ORAL_TABLET | ORAL | Status: AC | PRN
  Administered 2024-04-12 – 2024-05-02 (×2): 15 mg via ORAL
  Filled 2024-04-12 (×2): qty 1

## 2024-04-12 NOTE — Plan of Care (Signed)
  Problem: Coping: Goal: Ability to adjust to condition or change in health will improve Outcome: Progressing   Problem: Coping: Goal: Level of anxiety will decrease Outcome: Progressing

## 2024-04-12 NOTE — Progress Notes (Signed)
 PROGRESS NOTE   Meghan Jarvis  RUE:[id number] DOB: [birth year] DOA: 12/08/2023 [name]    HPI was taken from NP [name]: 48 y.o with significant PMH of EtOH abuse ,Cocaine use, Homelessness, Uncontrolled HIV, on BIKTARVY , A-fib, GERD, Hypothyroidism, Diabetes, who presented to the ED with chief complaints of unresponsiveness.   Per ED reports, EMS report that patient was boarding at a friend's house who noticed that she had not woken up the whole day. Patient's friend attempted to wake her up but noticed that she was unresponsive and making "gurgling" sound so they called EMS. On EMS arrival, patient was found to be unconscious and incontinent of urine and feces.     ED Course: Initial vital signs showed HR of 118 beats/minute, BP 90/60 mm Hg, the RR 25 breaths/minute, and the oxygen saturation 98% on NRB and a temperature of 100.71F. Patient was obtunded with no gag reflex with debris around the mouth and nose concerning for stomach contents. She was emergently intubated for airway protection. Pertinent Labs/Diagnostics Findings: Na+/ K+:140/2.6  Glucose: 117 BUN/Cr.:33/1.18 AST/ALT:45/17 WBC:9.6 K/L PCT: 2.2 Lactic acid: 1.1 COVID PCR: Negative,  ABG: pO2 139; pCO2 27; pH 7.51;  HCO3 21.5, %O2 Sat 100.  CXR> see full report  Medication administered in the ED: Patient given 30 cc/kg of fluids and started on broad-spectrum antibiotics Ceftriaxone  and Azithromycin  for suspected sepsis Disposition:ICU   As per Dr. Broadus Canes 4/16-4/22/25: Pt has remained medically stable. Still awaiting guardianship hearing date. 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 twice daily, x 3 doses given s/p torsemide  40 mg p.o. daily, which was discontinued. BNP 70.7 wnl  Continue fluid restriction 1.5 L/day TTE LVEF 55 to 60%, grade 1 diastolic dysfunction, 5/2 edema resolved, continue to monitor off diuretics for now  HIV: w/ progression to AIDS. Poor historian and does not have capacity to make decisions as per psych. Still waiting on guardianship hearing date. Continue on biktarvy . Continue on atovaquone  for PCP ppx  HIV associated neurocognitive disorder: MRI did not show any enhancing lesions but did show severe cerebral atrophy likely secondary to HIV/AIDS.    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 Continue w/ conservative management   Diarrhea: Resolved    Hypotension: resolved. Holding midodrine    Poor dental hygiene, one left maxillary tooth fell off on 4/23 Started oral rinse nightly for 2 weeks  Depression due to prolonged stay in the hospital and unable to see significant other. Psych consulted for further recommendation.  5/3 c/o chest pain, troponin x 2 negative, EKG no significant changes. Most likely indigestion, patient is already on PPI Use Maalox as needed   Overweight, BMI 30 Significant weight gain during hospital stay Nutritionist consulted  Social issues: homeless and has polysubstance use disorder (alcohol, cocaine).  Pt's boyfriend/fiance' is not able to visit the pt anymore b/c concern of him bringing the pt alcohol and/or illicit drugs. Still awaiting guardianship. No safe d/c plan    DVT prophylaxis: lovenox  Code Status: full  Family Communication:  Disposition Plan: awaiting  guardianship still. Unsafe d/c plan   Level of care: Med-Surg Consultants:  Psych  Ortho surg  ID  Procedures:  Antimicrobials:   Subjective: No significant events overnight, patient has midsternal  and epigastric pain in the morning time, troponin 2 times negative, EKG did not show any significant changes.  Pain improved after nitroglycerin and prn pain meds. During my exam patient was pointing towards epigastric area and she was eating lunch.  It seems patient was having some indigestion, diet restriction was advised.    Objective: Vitals:   04/11/24 2109 04/12/24 0413 04/12/24 0825 04/12/24 0940  BP: 100/69 110/67 112/83 110/81  Pulse: 89 95 98 98  Resp: 20 20  18   Temp: 97.9 F (36.6 C) 98.1 F (36.7 C) 98.6 F (37 C)   TempSrc:      SpO2: 100% 97% 100% 100%  Weight:  89.8 kg    Height:        Intake/Output Summary (Last 24 hours) at 04/12/2024 1655 Last data filed at 04/12/2024 1419 Gross per 24 hour  Intake 1560 ml  Output --  Net 1560 ml    Filed Weights   04/11/24 0500 04/11/24 0514 04/12/24 0413  Weight: 88 kg 90.2 kg 89.8 kg    Examination:  General exam: Appears calm & comfortable  Respiratory system: clear breath sounds b/l  Cardiovascular system: S1 & S2+. No rubs or gallops   Gastrointestinal system: abd is soft, NT, ND & hypoactive bowel sounds  Central nervous system: alert & awake. Moves all extremities  Psychiatry: judgement and insight appears poor. Flat mood and affect Extremities: 1-2+ pedal edema, no calf tenderness.  Edema improved   Data Reviewed: I have personally reviewed following labs and imaging studies  CBC: Recent Labs  Lab 04/09/24 0547  WBC 4.5  HGB 10.0*  HCT 30.3*  MCV 81.5  PLT 315   Basic Metabolic Panel: Recent Labs  Lab 04/08/24 1501 04/09/24 0547 04/10/24 0543 04/11/24 0539 04/12/24 0516 04/12/24 0517  NA 140 140 138 136  --  138  K 3.6 3.8 3.3* 4.0  --  3.5  CL 106 103 100 100  --  101  CO2 24 28 31 27   --  26  GLUCOSE 128* 96 125* 110*  --  124*  BUN 14 14 22* 20  --  19  CREATININE 0.73 0.75 0.95 0.89  --  0.74  CALCIUM  9.0 9.5 9.1 9.7  --  9.3  MG  --   --   --   --  1.9  --    GFR: Estimated  Creatinine Clearance: 99.8 mL/min (by C-G formula based on SCr of 0.74 mg/dL). Liver Function Tests: Recent Labs  Lab 04/09/24 0547  AST 29  ALT 23  ALKPHOS 57  BILITOT 0.4  PROT 7.5  ALBUMIN 3.4*   No results for input(s): "LIPASE", "AMYLASE" in the last 168 hours. No results for input(s): "AMMONIA" in the last 168 hours. Coagulation Profile: No results for input(s): "INR", "PROTIME" in the last 168 hours. Cardiac Enzymes: No results for input(s): "CKTOTAL", "CKMB", "CKMBINDEX", "TROPONINI" in the last 168 hours. BNP (last 3 results) No results for input(s): "PROBNP" in the last 8760 hours. HbA1C: No results for input(s): "HGBA1C" in the last 72 hours. CBG: No results for input(s): "GLUCAP" in the last 168 hours. Lipid Profile: No results for input(s): "CHOL", "HDL", "LDLCALC", "TRIG", "CHOLHDL", "LDLDIRECT" in the last 72 hours. Thyroid Function Tests: No results for input(s): "TSH", "T4TOTAL", "FREET4", "T3FREE", "  THYROIDAB" in the last 72 hours. Anemia Panel: No results for input(s): "VITAMINB12", "FOLATE", "FERRITIN", "TIBC", "IRON", "RETICCTPCT" in the last 72 hours. Sepsis Labs: No results for input(s): "PROCALCITON", "LATICACIDVEN" in the last 168 hours.  No results found for this or any previous visit (from the past 240 hours).    Radiology Studies: No results found.    Scheduled Meds:  antiseptic oral rinse  15 mL Mouth Rinse QPC supper   atovaquone   1,500 mg Oral Q breakfast   bictegravir-emtricitabine -tenofovir  AF  1 tablet Oral Daily   chlorhexidine   15 mL Mouth/Throat BID   DULoxetine   60 mg Oral Daily   enoxaparin  (LOVENOX ) injection  40 mg Subcutaneous QHS   folic acid   1 mg Oral Daily   hydrocerin   Topical BID   lidocaine   1 patch Transdermal Q24H   lipase/protease/amylase  24,000 Units Oral TID AC   multivitamin with minerals  1 tablet Oral QHS   pantoprazole   40 mg Oral BID   senna-docusate  2 tablet Oral BID   thiamine   100 mg Oral Daily    Continuous Infusions:     LOS: 126 days     Althia Atlas, MD [facility] Pager [phone]  If 7PM-7AM, please contact night-coverage [email or web address] 04/12/2024, 4:55 PM

## 2024-04-13 DIAGNOSIS — B20 Human immunodeficiency virus [HIV] disease: Secondary | ICD-10-CM | POA: Diagnosis not present

## 2024-04-13 DIAGNOSIS — F02818 Dementia in other diseases classified elsewhere, unspecified severity, with other behavioral disturbance: Secondary | ICD-10-CM | POA: Diagnosis not present

## 2024-04-13 LAB — BASIC METABOLIC PANEL WITH GFR
Anion gap: 9 (ref 5–15)
BUN: 15 mg/dL (ref 6–20)
CO2: 25 mmol/L (ref 22–32)
Calcium: 9.1 mg/dL (ref 8.9–10.3)
Chloride: 103 mmol/L (ref 98–111)
Creatinine, Ser: 0.71 mg/dL (ref 0.44–1.00)
GFR, Estimated: >60 mL/min (ref >60)
Glucose, Bld: 119 mg/dL — ABNORMAL HIGH (ref 70–99)
Potassium: 3.6 mmol/L (ref 3.5–5.1)
Sodium: 137 mmol/L (ref 135–145)

## 2024-04-13 NOTE — Progress Notes (Signed)
 PROGRESS NOTE   Meghan Jarvis  ZOX:[id number] DOB: [birth year] DOA: 12/08/2023 [name]    HPI was taken from NP [name]: 48 y.o with significant PMH of EtOH abuse ,Cocaine use, Homelessness, Uncontrolled HIV, on BIKTARVY , A-fib, GERD, Hypothyroidism, Diabetes, who presented to the ED with chief complaints of unresponsiveness.   Per ED reports, EMS report that patient was boarding at a friend's house who noticed that she had not woken up the whole day. Patient's friend attempted to wake her up but noticed that she was unresponsive and making "gurgling" sound so they called EMS. On EMS arrival, patient was found to be unconscious and incontinent of urine and feces.     ED Course: Initial vital signs showed HR of 118 beats/minute, BP 90/60 mm Hg, the RR 25 breaths/minute, and the oxygen saturation 98% on NRB and a temperature of 100.20F. Patient was obtunded with no gag reflex with debris around the mouth and nose concerning for stomach contents. She was emergently intubated for airway protection. Pertinent Labs/Diagnostics Findings: Na+/ K+:140/2.6  Glucose: 117 BUN/Cr.:33/1.18 AST/ALT:45/17 WBC:9.6 K/L PCT: 2.2 Lactic acid: 1.1 COVID PCR: Negative,  ABG: pO2 139; pCO2 27; pH 7.51;  HCO3 21.5, %O2 Sat 100.  CXR> see full report  Medication administered in the ED: Patient given 30 cc/kg of fluids and started on broad-spectrum antibiotics Ceftriaxone  and Azithromycin  for suspected sepsis Disposition:ICU   As per Dr. Broadus Canes 4/16-4/22/25: Pt has remained medically stable. Still awaiting guardianship hearing date. 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 twice daily, x 3 doses given s/p torsemide  40 mg p.o. daily, which was discontinued. BNP 70.7 wnl  Continue fluid restriction 1.5 L/day TTE LVEF 55 to 60%, grade 1 diastolic dysfunction, 5/2 edema resolved, continue to monitor off diuretics for now  HIV: w/ progression to AIDS. Poor historian and does not have capacity to make decisions as per psych. Still waiting on guardianship hearing date. Continue on biktarvy . Continue on atovaquone  for PCP ppx  HIV associated neurocognitive disorder: MRI did not show any enhancing lesions but did show severe cerebral atrophy likely secondary to HIV/AIDS.    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 Continue w/ conservative management   Diarrhea: Resolved    Hypotension: resolved. Holding midodrine    Poor dental hygiene, one left maxillary tooth fell off on 4/23 Started oral rinse nightly for 2 weeks  Depression due to prolonged stay in the hospital and unable to see significant other. Psych consulted for further recommendation.  5/3 c/o chest pain, troponin x 2 negative, EKG no significant changes. Most likely indigestion, patient is already on PPI Use Maalox as needed   Overweight, BMI 30 Significant weight gain during hospital stay Nutritionist consulted  Social issues: homeless and has polysubstance use disorder (alcohol, cocaine).  Pt's boyfriend/fiance' is not able to visit the pt anymore b/c concern of him bringing the pt alcohol and/or illicit drugs. Still awaiting guardianship. No safe d/c plan    DVT prophylaxis: lovenox  Code Status: full  Family Communication:  Disposition Plan: awaiting  guardianship still. Unsafe d/c plan   Level of care: Med-Surg Consultants:  Psych  Ortho surg  ID  Procedures:  Antimicrobials:   Subjective: No significant events overnight, complaining of backache  due to lying in the bed, patient was advised to ambulate.  No nasal complaints.   Objective: Vitals:   04/12/24 0940 04/12/24 2029 04/13/24 0349 04/13/24 0810  BP: 110/81 102/60 118/82 108/71  Pulse: 98  91 84  Resp: 18 20 20 18   Temp:  98.1 F (36.7 C) 98.2 F (36.8 C) 98.8 F (37.1 C)  TempSrc:    Oral  SpO2: 100% 97% 99% 99%  Weight:   89.5 kg   Height:        Intake/Output Summary (Last 24 hours) at 04/13/2024 1546 Last data filed at 04/13/2024 1500 Gross per 24 hour  Intake 240 ml  Output --  Net 240 ml    Filed Weights   04/11/24 0514 04/12/24 0413 04/13/24 0349  Weight: 90.2 kg 89.8 kg 89.5 kg    Examination:  General exam: Appears calm & comfortable  Respiratory system: clear breath sounds b/l  Cardiovascular system: S1 & S2+. No rubs or gallops   Gastrointestinal system: abd is soft, NT, ND & hypoactive bowel sounds  Central nervous system: alert & awake. Moves all extremities  Psychiatry: judgement and insight appears poor. Flat mood and affect Extremities: 1-2+ pedal edema, no calf tenderness.  Edema improved   Data Reviewed: I have personally reviewed following labs and imaging studies  CBC: Recent Labs  Lab 04/09/24 0547  WBC 4.5  HGB 10.0*  HCT 30.3*  MCV 81.5  PLT 315   Basic Metabolic Panel: Recent Labs  Lab 04/09/24 0547 04/10/24 0543 04/11/24 0539 04/12/24 0516 04/12/24 0517 04/13/24 0624  NA 140 138 136  --  138 137  K 3.8 3.3* 4.0  --  3.5 3.6  CL 103 100 100  --  101 103  CO2 28 31 27   --  26 25  GLUCOSE 96 125* 110*  --  124* 119*  BUN 14 22* 20  --  19 15  CREATININE 0.75 0.95 0.89  --  0.74 0.71  CALCIUM  9.5 9.1 9.7  --  9.3 9.1  MG  --   --   --  1.9  --   --    GFR: Estimated Creatinine Clearance: 99.5 mL/min (by C-G formula based on SCr of 0.71 mg/dL). Liver Function Tests: Recent Labs  Lab 04/09/24 0547  AST 29  ALT 23  ALKPHOS 57  BILITOT 0.4  PROT 7.5  ALBUMIN 3.4*   No results for input(s): "LIPASE",  "AMYLASE" in the last 168 hours. No results for input(s): "AMMONIA" in the last 168 hours. Coagulation Profile: No results for input(s): "INR", "PROTIME" in the last 168 hours. Cardiac Enzymes: No results for input(s): "CKTOTAL", "CKMB", "CKMBINDEX", "TROPONINI" in the last 168 hours. BNP (last 3 results) No results for input(s): "PROBNP" in the last 8760 hours. HbA1C: No results for input(s): "HGBA1C" in the last 72 hours. CBG: No results for input(s): "GLUCAP" in the last 168 hours. Lipid Profile: No results for input(s): "CHOL", "HDL", "LDLCALC", "TRIG", "CHOLHDL", "LDLDIRECT" in the last 72 hours. Thyroid Function Tests: No results for input(s): "TSH", "T4TOTAL", "FREET4", "T3FREE", "THYROIDAB" in the last 72 hours. Anemia Panel: No results for input(s): "VITAMINB12", "FOLATE", "FERRITIN", "TIBC", "IRON", "RETICCTPCT" in the last 72 hours. Sepsis Labs: No results for input(s): "PROCALCITON", "LATICACIDVEN" in the last 168 hours.  No results  found for this or any previous visit (from the past 240 hours).    Radiology Studies: No results found.    Scheduled Meds:  antiseptic oral rinse  15 mL Mouth Rinse QPC supper   atovaquone   1,500 mg Oral Q breakfast   bictegravir-emtricitabine -tenofovir  AF  1 tablet Oral Daily   chlorhexidine   15 mL Mouth/Throat BID   DULoxetine   60 mg Oral Daily   enoxaparin  (LOVENOX ) injection  40 mg Subcutaneous QHS   folic acid   1 mg Oral Daily   hydrocerin   Topical BID   lidocaine   1 patch Transdermal Q24H   lipase/protease/amylase  24,000 Units Oral TID AC   multivitamin with minerals  1 tablet Oral QHS   pantoprazole   40 mg Oral BID   senna-docusate  2 tablet Oral BID   thiamine   100 mg Oral Daily   Continuous Infusions:     LOS: 127 days     Meghan Atlas, MD [facility] Pager [phone]  If 7PM-7AM, please contact night-coverage [email or web address] 04/13/2024, 3:46 PM

## 2024-04-13 NOTE — Plan of Care (Signed)
  Problem: Coping: Goal: Ability to adjust to condition or change in health will improve Outcome: Progressing   Problem: Fluid Volume: Goal: Ability to maintain a balanced intake and output will improve Outcome: Progressing   Problem: Health Behavior/Discharge Planning: Goal: Ability to identify and utilize available resources and services will improve Outcome: Progressing Goal: Ability to manage health-related needs will improve Outcome: Progressing

## 2024-04-14 DIAGNOSIS — B20 Human immunodeficiency virus [HIV] disease: Secondary | ICD-10-CM | POA: Diagnosis not present

## 2024-04-14 DIAGNOSIS — F02818 Dementia in other diseases classified elsewhere, unspecified severity, with other behavioral disturbance: Secondary | ICD-10-CM | POA: Diagnosis not present

## 2024-04-14 NOTE — Progress Notes (Signed)
 PROGRESS NOTE   Meghan Jarvis  ZDG:[id number] DOB: [birth year] DOA: 12/08/2023 [name]    HPI was taken from NP [name]: 48 y.o with significant PMH of EtOH abuse ,Cocaine use, Homelessness, Uncontrolled HIV, on BIKTARVY , A-fib, GERD, Hypothyroidism, Diabetes, who presented to the ED with chief complaints of unresponsiveness.   Per ED reports, EMS report that patient was boarding at a friend's house who noticed that she had not woken up the whole day. Patient's friend attempted to wake her up but noticed that she was unresponsive and making "gurgling" sound so they called EMS. On EMS arrival, patient was found to be unconscious and incontinent of urine and feces.     ED Course: Initial vital signs showed HR of 118 beats/minute, BP 90/60 mm Hg, the RR 25 breaths/minute, and the oxygen saturation 98% on NRB and a temperature of 100.48F. Patient was obtunded with no gag reflex with debris around the mouth and nose concerning for stomach contents. She was emergently intubated for airway protection. Pertinent Labs/Diagnostics Findings: Na+/ K+:140/2.6  Glucose: 117 BUN/Cr.:33/1.18 AST/ALT:45/17 WBC:9.6 K/L PCT: 2.2 Lactic acid: 1.1 COVID PCR: Negative,  ABG: pO2 139; pCO2 27; pH 7.51;  HCO3 21.5, %O2 Sat 100.  CXR> see full report  Medication administered in the ED: Patient given 30 cc/kg of fluids and started on broad-spectrum antibiotics Ceftriaxone  and Azithromycin  for suspected sepsis Disposition:ICU   As per Dr. Broadus Canes 4/16-4/22/25: Pt has remained medically stable. Still awaiting guardianship hearing date. 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 twice daily, x 3 doses given s/p torsemide  40 mg p.o. daily, which was discontinued. BNP 70.7 wnl  Continue fluid restriction 1.5 L/day TTE LVEF 55 to 60%, grade 1 diastolic dysfunction, 5/2 edema resolved, continue to monitor off diuretics for now  HIV: w/ progression to AIDS. Poor historian and does not have capacity to make decisions as per psych. Still waiting on guardianship hearing date. Continue on biktarvy . Continue on atovaquone  for PCP ppx  HIV associated neurocognitive disorder: MRI did not show any enhancing lesions but did show severe cerebral atrophy likely secondary to HIV/AIDS.    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 Continue w/ conservative management   Diarrhea: Resolved    Hypotension: resolved. Holding midodrine    Poor dental hygiene, one left maxillary tooth fell off on 4/23 Started oral rinse nightly for 2 weeks  Depression due to prolonged stay in the hospital and unable to see significant other. Psych consulted for further recommendation.  5/3 c/o chest pain, troponin x 2 negative, EKG no significant changes. Most likely indigestion, patient is already on PPI Use Maalox as needed   Overweight, BMI 30 Significant weight gain during hospital stay Nutritionist consulted  Social issues: homeless and has polysubstance use disorder (alcohol, cocaine).  Pt's boyfriend/fiance' is not able to visit the pt anymore b/c concern of him bringing the pt alcohol and/or illicit drugs. Still awaiting guardianship. No safe d/c plan    DVT prophylaxis: lovenox  Code Status: full  Family Communication:  Disposition Plan: awaiting  guardianship still. Unsafe d/c plan   Level of care: Med-Surg Consultants:  Psych  Ortho surg  ID  Procedures:  Antimicrobials:   Subjective: No significant events overnight, patient was laying  comfortably, denied any issues. She would like to talk to social worker and would like to go home, she does not want to go to group home   Objective: Vitals:   04/13/24 0810 04/13/24 1550 04/13/24 1954 04/14/24 0434  BP: 108/71 114/76 107/70 119/79  Pulse: 84 (!) 101 100 93  Resp: 18 16 20 19   Temp: 98.8 F (37.1 C) 98.3 F (36.8 C) 98.2 F (36.8 C) 98.3 F (36.8 C)  TempSrc: Oral Oral    SpO2: 99% 100% 98% 99%  Weight:      Height:        Intake/Output Summary (Last 24 hours) at 04/14/2024 1509 Last data filed at 04/13/2024 1900 Gross per 24 hour  Intake 0 ml  Output --  Net 0 ml    Filed Weights   04/11/24 0514 04/12/24 0413 04/13/24 0349  Weight: 90.2 kg 89.8 kg 89.5 kg    Examination:  General exam: Appears calm & comfortable  Respiratory system: clear breath sounds b/l  Cardiovascular system: S1 & S2+. No rubs or gallops   Gastrointestinal system: abd is soft, NT, ND & hypoactive bowel sounds  Central nervous system: alert & awake. Moves all extremities  Psychiatry: judgement and insight appears poor. Flat mood and affect Extremities: mild pedal edema, no calf tenderness.     Data Reviewed: I have personally reviewed following labs and imaging studies  CBC: Recent Labs  Lab 04/09/24 0547  WBC 4.5  HGB 10.0*  HCT 30.3*  MCV 81.5  PLT 315   Basic Metabolic Panel: Recent Labs  Lab 04/09/24 0547 04/10/24 0543 04/11/24 0539 04/12/24 0516 04/12/24 0517 04/13/24 0624  NA 140 138 136  --  138 137  K 3.8 3.3* 4.0  --  3.5 3.6  CL 103 100 100  --  101 103  CO2 28 31 27   --  26 25  GLUCOSE 96 125* 110*  --  124* 119*  BUN 14 22* 20  --  19 15  CREATININE 0.75 0.95 0.89  --  0.74 0.71  CALCIUM  9.5 9.1 9.7  --  9.3 9.1  MG  --   --   --  1.9  --   --    GFR: Estimated Creatinine Clearance: 99.5 mL/min (by C-G formula based on SCr of 0.71 mg/dL). Liver Function Tests: Recent Labs  Lab 04/09/24 0547  AST 29  ALT 23  ALKPHOS 57  BILITOT 0.4  PROT  7.5  ALBUMIN 3.4*   No results for input(s): "LIPASE", "AMYLASE" in the last 168 hours. No results for input(s): "AMMONIA" in the last 168 hours. Coagulation Profile: No results for input(s): "INR", "PROTIME" in the last 168 hours. Cardiac Enzymes: No results for input(s): "CKTOTAL", "CKMB", "CKMBINDEX", "TROPONINI" in the last 168 hours. BNP (last 3 results) No results for input(s): "PROBNP" in the last 8760 hours. HbA1C: No results for input(s): "HGBA1C" in the last 72 hours. CBG: No results for input(s): "GLUCAP" in the last 168 hours. Lipid Profile: No results for input(s): "CHOL", "HDL", "LDLCALC", "TRIG", "CHOLHDL", "LDLDIRECT" in the last 72 hours. Thyroid Function Tests: No results for input(s): "TSH", "T4TOTAL", "FREET4", "T3FREE", "THYROIDAB" in the last 72 hours. Anemia Panel: No results for input(s): "VITAMINB12", "FOLATE", "FERRITIN", "TIBC", "IRON", "RETICCTPCT" in the last 72 hours. Sepsis Labs:  No results for input(s): "PROCALCITON", "LATICACIDVEN" in the last 168 hours.  No results found for this or any previous visit (from the past 240 hours).    Radiology Studies: No results found.    Scheduled Meds:  atovaquone   1,500 mg Oral Q breakfast   bictegravir-emtricitabine -tenofovir  AF  1 tablet Oral Daily   chlorhexidine   15 mL Mouth/Throat BID   DULoxetine   60 mg Oral Daily   enoxaparin  (LOVENOX ) injection  40 mg Subcutaneous QHS   folic acid   1 mg Oral Daily   hydrocerin   Topical BID   lidocaine   1 patch Transdermal Q24H   lipase/protease/amylase  24,000 Units Oral TID AC   multivitamin with minerals  1 tablet Oral QHS   pantoprazole   40 mg Oral BID   senna-docusate  2 tablet Oral BID   thiamine   100 mg Oral Daily   Continuous Infusions:     LOS: 128 days     Althia Atlas, MD [facility] Pager [phone]  If 7PM-7AM, please contact night-coverage [email or web address] 04/14/2024, 3:09 PM

## 2024-04-14 NOTE — TOC Progression Note (Signed)
 Transition of Care [facility]) - Progression Note    Patient Details  Name: Meghan Jarvis MRN: [id number] Date of Birth: Mar 30, 1976  [facility] CM/SW Contact  Odilia Bennett, LCSW Phone Number: 04/14/2024, 3:16 PM  Clinical Narrative: Local group home owner has not come to assess patient yet. CSW provided patient's room number.    Expected Discharge Plan and Services         Expected Discharge Date: 01/10/24                                     Social Determinants of Health (SDOH) Interventions SDOH Screenings   Food Insecurity: Patient Unable To Answer (12/09/2023)  Recent Concern: Food Insecurity - Food Insecurity Present (09/26/2023)  Housing: Patient Unable To Answer (12/09/2023)  Recent Concern: Housing - Medium Risk (09/26/2023)  Transportation Needs: Patient Unable To Answer (12/09/2023)  Recent Concern: Transportation Needs - Unmet Transportation Needs (10/11/2023)  Utilities: Patient Unable To Answer (12/09/2023)  Recent Concern: Utilities - At Risk (09/25/2023)  Tobacco Use: High Risk (12/08/2023)    Readmission Risk Interventions     No data to display

## 2024-04-15 DIAGNOSIS — B20 Human immunodeficiency virus [HIV] disease: Secondary | ICD-10-CM | POA: Diagnosis not present

## 2024-04-15 DIAGNOSIS — F02818 Dementia in other diseases classified elsewhere, unspecified severity, with other behavioral disturbance: Secondary | ICD-10-CM | POA: Diagnosis not present

## 2024-04-15 NOTE — Plan of Care (Signed)
  Problem: Coping: Goal: Ability to adjust to condition or change in health will improve Outcome: Progressing   Problem: Fluid Volume: Goal: Ability to maintain a balanced intake and output will improve Outcome: Progressing   Problem: Health Behavior/Discharge Planning: Goal: Ability to identify and utilize available resources and services will improve Outcome: Progressing Goal: Ability to manage health-related needs will improve Outcome: Progressing   Problem: Metabolic: Goal: Ability to maintain appropriate glucose levels will improve Outcome: Progressing   Problem: Nutritional: Goal: Maintenance of adequate nutrition will improve Outcome: Progressing Goal: Progress toward achieving an optimal weight will improve Outcome: Progressing   Problem: Skin Integrity: Goal: Risk for impaired skin integrity will decrease Outcome: Progressing   Problem: Tissue Perfusion: Goal: Adequacy of tissue perfusion will improve Outcome: Progressing   Problem: Education: Goal: Knowledge of General Education information will improve Description: Including pain rating scale, medication(s)/side effects and non-pharmacologic comfort measures Outcome: Progressing   Problem: Health Behavior/Discharge Planning: Goal: Ability to manage health-related needs will improve Outcome: Progressing   Problem: Clinical Measurements: Goal: Ability to maintain clinical measurements within normal limits will improve Outcome: Progressing Goal: Will remain free from infection Outcome: Progressing Goal: Diagnostic test results will improve Outcome: Progressing Goal: Respiratory complications will improve Outcome: Progressing Goal: Cardiovascular complication will be avoided Outcome: Progressing   Problem: Activity: Goal: Risk for activity intolerance will decrease Outcome: Progressing   Problem: Nutrition: Goal: Adequate nutrition will be maintained Outcome: Progressing   Problem: Coping: Goal:  Level of anxiety will decrease Outcome: Progressing   Problem: Elimination: Goal: Will not experience complications related to bowel motility Outcome: Progressing Goal: Will not experience complications related to urinary retention Outcome: Progressing   Problem: Pain Management: Goal: General experience of comfort will improve Outcome: Progressing   Problem: Safety: Goal: Ability to remain free from injury will improve Outcome: Progressing   Problem: Skin Integrity: Goal: Risk for impaired skin integrity will decrease Outcome: Progressing   Problem: Activity: Goal: Ability to tolerate increased activity will improve Outcome: Progressing   Problem: Respiratory: Goal: Ability to maintain a clear airway and adequate ventilation will improve Outcome: Progressing

## 2024-04-15 NOTE — Progress Notes (Signed)
 PROGRESS NOTE   Meghan Jarvis  WGN:[id number] DOB: Jul 28, 1976 DOA: 12/08/2023 [name]    HPI was taken from NP [name]: 48 y.o with significant PMH of EtOH abuse ,Cocaine use, Homelessness, Uncontrolled HIV, on BIKTARVY , A-fib, GERD, Hypothyroidism, Diabetes, who presented to the ED with chief complaints of unresponsiveness.   Per ED reports, EMS report that patient was boarding at a friend's house who noticed that she had not woken up the whole day. Patient's friend attempted to wake her up but noticed that she was unresponsive and making "gurgling" sound so they called EMS. On EMS arrival, patient was found to be unconscious and incontinent of urine and feces.     ED Course: Initial vital signs showed HR of 118 beats/minute, BP 90/60 mm Hg, the RR 25 breaths/minute, and the oxygen saturation 98% on NRB and a temperature of 100.29F. Patient was obtunded with no gag reflex with debris around the mouth and nose concerning for stomach contents. She was emergently intubated for airway protection. Pertinent Labs/Diagnostics Findings: Na+/ K+:140/2.6  Glucose: 117 BUN/Cr.:33/1.18 AST/ALT:45/17 WBC:9.6 K/L PCT: 2.2 Lactic acid: 1.1 COVID PCR: Negative,  ABG: pO2 139; pCO2 27; pH 7.51;  HCO3 21.5, %O2 Sat 100.  CXR> see full report  Medication administered in the ED: Patient given 30 cc/kg of fluids and started on broad-spectrum antibiotics Ceftriaxone  and Azithromycin  for suspected sepsis Disposition:ICU   As per Dr. Broadus Canes 4/16-4/22/25: Pt has remained medically stable. Still awaiting guardianship hearing date. 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 twice daily, x 3 doses given s/p torsemide  40 mg p.o. daily, which was discontinued. BNP 70.7 wnl  Continue fluid restriction 1.5 L/day TTE LVEF 55 to 60%, grade 1 diastolic dysfunction, 5/2 edema resolved, continue to monitor off diuretics for now  HIV: w/ progression to AIDS. Poor historian and does not have capacity to make decisions as per psych. Still waiting on guardianship hearing date. Continue on biktarvy . Continue on atovaquone  for PCP ppx  HIV associated neurocognitive disorder: MRI did not show any enhancing lesions but did show severe cerebral atrophy likely secondary to HIV/AIDS.    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 Continue w/ conservative management   Diarrhea: Resolved    Hypotension: resolved. Holding midodrine    Poor dental hygiene, one left maxillary tooth fell off on 4/23 Started oral rinse nightly for 2 weeks  Depression due to prolonged stay in the hospital and unable to see significant other. Psych consulted for further recommendation.  5/3 c/o chest pain, troponin x 2 negative, EKG no significant changes. Most likely indigestion, patient is already on PPI Use Maalox as needed   Overweight, BMI 30 Significant weight gain during hospital stay Nutritionist consulted  Social issues: homeless and has polysubstance use disorder (alcohol, cocaine).  Pt's boyfriend/fiance' is not able to visit the pt anymore b/c concern of him bringing the pt alcohol and/or illicit drugs. Still awaiting guardianship. No safe d/c plan    DVT prophylaxis: lovenox  Code Status: full  Family Communication:  Disposition Plan: awaiting  guardianship still. Unsafe d/c plan   Level of care: Med-Surg Consultants:  Psych  Ortho surg  ID  Procedures:  Antimicrobials:   Subjective: No significant events overnight, patient was sitting  comfortably on the bed, denied any complaints. Advised to ambulate    Objective: Vitals:   04/14/24 1949 04/15/24 0337 04/15/24 0850 04/15/24 1414  BP: 114/79 121/83 114/72 (!) 135/97  Pulse:  91 95 (!) 104  Resp: 20 20 16 16   Temp: 98.1 F (36.7 C) 98 F (36.7 C) 98 F (36.7 C)   TempSrc:      SpO2: 100% 100% 97% 97%  Weight:  90.6 kg    Height:        Intake/Output Summary (Last 24 hours) at 04/15/2024 1459 Last data filed at 04/15/2024 1056 Gross per 24 hour  Intake 0 ml  Output --  Net 0 ml    Filed Weights   04/12/24 0413 04/13/24 0349 04/15/24 0337  Weight: 89.8 kg 89.5 kg 90.6 kg    Examination:  General exam: Appears calm & comfortable  Respiratory system: clear breath sounds b/l  Cardiovascular system: S1 & S2+. No rubs or gallops   Gastrointestinal system: abd is soft, NT, ND & hypoactive bowel sounds  Central nervous system: alert & awake. Moves all extremities  Psychiatry: judgement and insight appears poor. Flat mood and affect Extremities: mild pedal edema, no calf tenderness.     Data Reviewed: I have personally reviewed following labs and imaging studies  CBC: Recent Labs  Lab 04/09/24 0547  WBC 4.5  HGB 10.0*  HCT 30.3*  MCV 81.5  PLT 315   Basic Metabolic Panel: Recent Labs  Lab 04/09/24 0547 04/10/24 0543 04/11/24 0539 04/12/24 0516 04/12/24 0517 04/13/24 0624  NA 140 138 136  --  138 137  K 3.8 3.3* 4.0  --  3.5 3.6  CL 103 100 100  --  101 103  CO2 28 31 27   --  26 25  GLUCOSE 96 125* 110*  --  124* 119*  BUN 14 22* 20  --  19 15  CREATININE 0.75 0.95 0.89  --  0.74 0.71  CALCIUM  9.5 9.1 9.7  --  9.3 9.1  MG  --   --   --  1.9  --   --    GFR: Estimated Creatinine Clearance: 100.2 mL/min (by C-G formula based on SCr of 0.71 mg/dL). Liver Function Tests: Recent Labs  Lab 04/09/24 0547  AST 29  ALT 23  ALKPHOS 57  BILITOT 0.4  PROT 7.5  ALBUMIN 3.4*   No results for input(s): "LIPASE", "AMYLASE" in the last 168  hours. No results for input(s): "AMMONIA" in the last 168 hours. Coagulation Profile: No results for input(s): "INR", "PROTIME" in the last 168 hours. Cardiac Enzymes: No results for input(s): "CKTOTAL", "CKMB", "CKMBINDEX", "TROPONINI" in the last 168 hours. BNP (last 3 results) No results for input(s): "PROBNP" in the last 8760 hours. HbA1C: No results for input(s): "HGBA1C" in the last 72 hours. CBG: No results for input(s): "GLUCAP" in the last 168 hours. Lipid Profile: No results for input(s): "CHOL", "HDL", "LDLCALC", "TRIG", "CHOLHDL", "LDLDIRECT" in the last 72 hours. Thyroid Function Tests: No results for input(s): "TSH", "T4TOTAL", "FREET4", "T3FREE", "THYROIDAB" in the last 72 hours. Anemia Panel: No results for input(s): "VITAMINB12", "FOLATE", "FERRITIN", "TIBC", "IRON", "RETICCTPCT" in the last 72 hours. Sepsis Labs: No results for input(s): "PROCALCITON", "LATICACIDVEN" in the last 168 hours.  No results found for this  or any previous visit (from the past 240 hours).    Radiology Studies: No results found.    Scheduled Meds:  atovaquone   1,500 mg Oral Q breakfast   bictegravir-emtricitabine -tenofovir  AF  1 tablet Oral Daily   chlorhexidine   15 mL Mouth/Throat BID   DULoxetine   60 mg Oral Daily   enoxaparin  (LOVENOX ) injection  40 mg Subcutaneous QHS   folic acid   1 mg Oral Daily   hydrocerin   Topical BID   lidocaine   1 patch Transdermal Q24H   lipase/protease/amylase  24,000 Units Oral TID AC   multivitamin with minerals  1 tablet Oral QHS   pantoprazole   40 mg Oral BID   senna-docusate  2 tablet Oral BID   thiamine   100 mg Oral Daily   Continuous Infusions:     LOS: 129 days     Althia Atlas, MD [facility] Pager [phone]  If 7PM-7AM, please contact night-coverage [email or web address] 04/15/2024, 2:59 PM

## 2024-04-15 NOTE — TOC Progression Note (Signed)
 Transition of Care [facility]) - Progression Note    Patient Details  Name: Meghan Jarvis MRN: [id number] Date of Birth: 1976/02/17  Transition of Care [facility] CM/SW Contact  Odilia Bennett, LCSW Phone Number: 04/15/2024, 8:24 AM  Clinical Narrative:  Local group home owner came to see patient late yesterday afternoon and called CSW after the meeting. He stated patient is adamant about not moving into a group home and that she wants to live in her own residence with her fiance'. Group home owner has had issues in the past with residents that did not want to be in the group home setting. If patient agrees to placement, he would consider re-evaluating her. CSW sent secure email to APS social worker and supervisor. APS social worker will come talk with her today.  Expected Discharge Plan and Services         Expected Discharge Date: 01/10/24                                     Social Determinants of Health (SDOH) Interventions SDOH Screenings   Food Insecurity: Patient Unable To Answer (12/09/2023)  Recent Concern: Food Insecurity - Food Insecurity Present (09/26/2023)  Housing: Patient Unable To Answer (12/09/2023)  Recent Concern: Housing - Medium Risk (09/26/2023)  Transportation Needs: Patient Unable To Answer (12/09/2023)  Recent Concern: Transportation Needs - Unmet Transportation Needs (10/11/2023)  Utilities: Patient Unable To Answer (12/09/2023)  Recent Concern: Utilities - At Risk (09/25/2023)  Tobacco Use: High Risk (12/08/2023)    Readmission Risk Interventions     No data to display

## 2024-04-16 DIAGNOSIS — F02818 Dementia in other diseases classified elsewhere, unspecified severity, with other behavioral disturbance: Secondary | ICD-10-CM | POA: Diagnosis not present

## 2024-04-16 DIAGNOSIS — B20 Human immunodeficiency virus [HIV] disease: Secondary | ICD-10-CM | POA: Diagnosis not present

## 2024-04-16 NOTE — TOC Progression Note (Signed)
 Transition of Care [facility]) - Progression Note    Patient Details  Name: Meghan Jarvis MRN: [id number] Date of Birth: 1976/03/29  Transition of Care [facility]) CM/SW Contact  Odilia Bennett, LCSW Phone Number: 04/16/2024, 10:03 AM  Clinical Narrative:  APS social worker came to speak to patient yesterday and said she is now agreeable to placement. APS supervisor said they would call the local group home owner. CSW updated [employer].   Expected Discharge Plan and Services         Expected Discharge Date: 01/10/24                                     Social Determinants of Health (SDOH) Interventions SDOH Screenings   Food Insecurity: Patient Unable To Answer (12/09/2023)  Recent Concern: Food Insecurity - Food Insecurity Present (09/26/2023)  Housing: Patient Unable To Answer (12/09/2023)  Recent Concern: Housing - Medium Risk (09/26/2023)  Transportation Needs: Patient Unable To Answer (12/09/2023)  Recent Concern: Transportation Needs - Unmet Transportation Needs (10/11/2023)  Utilities: Patient Unable To Answer (12/09/2023)  Recent Concern: Utilities - At Risk (09/25/2023)  Tobacco Use: High Risk (12/08/2023)    Readmission Risk Interventions     No data to display

## 2024-04-16 NOTE — Progress Notes (Signed)
 Date of Admission:  12/08/2023     ID: Meghan Jarvis is a 48 y.o. 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 Meghan Jarvis is a 48 y.o. with a history of AIDS, not compliant with meds or visits ,  , cocaine use was brought in by EMS after being found unresponsive on the couch  in an aquaintance place-   Subjective Patient is having her dinner Boyfriend at bedside She has no new complaints  Medications:   atovaquone   1,500 mg Oral Q breakfast   bictegravir-emtricitabine -tenofovir  AF  1 tablet Oral Daily   chlorhexidine   15 mL Mouth/Throat BID   DULoxetine   60 mg Oral Daily   enoxaparin  (LOVENOX ) injection  40 mg Subcutaneous QHS   folic acid   1 mg Oral Daily   hydrocerin   Topical BID   lidocaine   1 patch Transdermal Q24H   lipase/protease/amylase  24,000 Units Oral TID AC   multivitamin with minerals  1 tablet Oral QHS   pantoprazole   40 mg Oral BID   senna-docusate  2 tablet Oral BID   thiamine   100 mg Oral Daily    Objective: Vital signs in last 24 hours: Patient Vitals for the past 24 hrs:  BP Temp Pulse Resp SpO2 Weight  04/16/24 0500 -- -- -- -- -- 91 kg  04/16/24 0448 111/72 98.2 F (36.8 C) 91 20 97 % --  Awake and alert Hss1s2 Lungs clear to auscultation CNS grossly non focal  Examination of MS[facility]- restricted and painful movt rt hip  Lab Results    Latest Ref Rng & Units 04/09/2024    5:47 AM 04/03/2024    4:40 AM 04/01/2024    6:11 AM  CBC  WBC 4.0 - 10.5 K/uL 4.5  4.4  4.6   Hemoglobin 12.0 - 15.0 g/dL 91.4  9.5  9.7   Hematocrit 36.0 - 46.0 % 30.3  30.1  30.4   Platelets 150 - 400 K/uL 315  295  298        Latest Ref Rng & Units 04/13/2024    6:24 AM  04/12/2024    5:17 AM 04/11/2024    5:39 AM  CMP  Glucose 70 - 99 mg/dL 782  956  213   BUN 6 - 20 mg/dL 15  19  20    Creatinine 0.44 - 1.00 mg/dL 0.86  5.78  4.69   Sodium 135 - 145 mmol/L 137  138  136   Potassium 3.5 - 5.1 mmol/L 3.6  3.5  4.0   Chloride 98 - 111 mmol/L 103  101  100   CO2 22 - 32 mmol/L 25  26  27    Calcium  8.9 - 10.3 mg/dL 9.1  9.3  9.7       Microbiology: 12/28 BC NG  Toxo IgG > 400 Toxo PCR neg Toxo igM neg RPR NR CMV DNA neg Crypto neg HIV RNA 2 million>> 34, 000 Cd4 is 14 ( 2.4%) repeat on 1/29 is 416 ( 23%) Beta D glucan > 500 (  repeated) Histoplasma neg Fungal antibodies negative Genosure prime- no resistant mutations QuantiFERON gold indeterminate.  No treatment needed now.  Can repeat it later.   Sed rate 35 ( 03/27/24)  CRP CRP 0.7   Radiology MRI hip rt done on 02/25/24    Reviewed personally     Intramuscular edema and enhancement within the right gluteus medius and minimus muscles with a central area of non-enhancement in the posterior right gluteus minimus muscle adjacent to the posterior acetabular rim. Similar findings are present at the lateral aspect of the right gluteus maximus muscle adjacent to the greater trochanter. 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 No fluid signal at these locations to suggest abscess formation.   Assessment/Plan:  AIDS- - not been in care since 2021-  Was on Biktarvy  at one time and was non compliant then VL  on admission was 2 million and cd4 is 11  Started Biktarvy  on 12/14/23 .  Genotype no resistance to NRTI, NNRTI, Integrase inhibitor and PI Repeat Vl showed a significant drop in 3 weeks from 2 million to 34,000 . Latest vl is 60 and CD4 is 416 ( 18.8%) from 03/18/24 Continue atovaquone  for toxo and PCP prophylaxis  Significant weight gain 108>>189 pounds since this hospitalization - combination of not using cocaine, starting  HAART ( Biktarvy ), snacking  in the hospital Seen by nutritionist    Persistent Rt thigh, rt hip pain MRI shows myositis , myonecrosis of the gluteus medius , minimus Discussed with [facility] radiologist -moderate Osteoarthritis of rt hip.  Small muscle tear vs inflammation rt glutues  She was assesed by ortho and no intervention recommended Symptomatic management Weight reduction   Encephalopathy  has resolved ( Likely related to cocaine use on presentation)  Aspiration leading to acute hypoxic respiratory failure with right middle lobe and lower lobe consolidation in Dec 2024 .  Status post intubation and was mechanically ventilated in the early part of the admission.  treated     Rash- resolved likely was due to bactrim - changed to Atovaquone - Mepron       Pruritus with excoriations and hyperpigmentiaon resolved ? Bactrim  related rash on baseline dry and excoriated skin    No evidence of IRIS   Toxo IgG very high-  MRI brain no CNS lesions  . TOXO PCR negative On  PCP/TOXO  and MAI prophylaxis. Was On bactrim  for PCP and Toxo prophylaxis- because of new rash concerning for bactrim  allergy was changed to atovaquone  Rash resolved  Beta D glucan high > 500 (fungal antibodies negative-  Normalized on repeat testing    Active cocaine use  ETOH abuse  Anemia   leucopenia/thrombocytopenia could be due to AIDS ETOH abuse   resolved  CMV DNA neg , AFB blood culture sent-NG     HIV associated neurocognitive disorder compounded by substance use with no insight in her medical condition   She has no capacity , and waiting for placement- DSS involved   Discussed the management with patient   IF pt is discharged then she will go on Biktarvy  and atovaquone  I will follow her as OP-

## 2024-04-16 NOTE — Plan of Care (Signed)
  Problem: Coping: Goal: Ability to adjust to condition or change in health will improve Outcome: Progressing   Problem: Fluid Volume: Goal: Ability to maintain a balanced intake and output will improve Outcome: Progressing   Problem: Health Behavior/Discharge Planning: Goal: Ability to identify and utilize available resources and services will improve Outcome: Progressing Goal: Ability to manage health-related needs will improve Outcome: Progressing

## 2024-04-16 NOTE — Progress Notes (Signed)
 PROGRESS NOTE   Meghan Jarvis  QQV:[id number] DOB: 1976/12/09 DOA: 12/08/2023 [name]    HPI was taken from NP [name]: 48 y.o with significant PMH of EtOH abuse ,Cocaine use, Homelessness, Uncontrolled HIV, on BIKTARVY , A-fib, GERD, Hypothyroidism, Diabetes, who presented to the ED with chief complaints of unresponsiveness.   Per ED reports, EMS report that patient was boarding at a friend's house who noticed that she had not woken up the whole day. Patient's friend attempted to wake her up but noticed that she was unresponsive and making "gurgling" sound so they called EMS. On EMS arrival, patient was found to be unconscious and incontinent of urine and feces.     ED Course: Initial vital signs showed HR of 118 beats/minute, BP 90/60 mm Hg, the RR 25 breaths/minute, and the oxygen saturation 98% on NRB and a temperature of 100.86F. Patient was obtunded with no gag reflex with debris around the mouth and nose concerning for stomach contents. She was emergently intubated for airway protection. Pertinent Labs/Diagnostics Findings: Na+/ K+:140/2.6  Glucose: 117 BUN/Cr.:33/1.18 AST/ALT:45/17 WBC:9.6 K/L PCT: 2.2 Lactic acid: 1.1 COVID PCR: Negative,  ABG: pO2 139; pCO2 27; pH 7.51;  HCO3 21.5, %O2 Sat 100.  CXR> see full report  Medication administered in the ED: Patient given 30 cc/kg of fluids and started on broad-spectrum antibiotics Ceftriaxone  and Azithromycin  for suspected sepsis Disposition:ICU   As per Dr. Broadus Canes 4/16-4/22/25: Pt has remained medically stable. Still awaiting guardianship hearing date. 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 twice daily, x 3 doses given s/p torsemide  40 mg p.o. daily, which was discontinued. BNP 70.7 wnl  Continue fluid restriction 1.5 L/day TTE LVEF 55 to 60%, grade 1 diastolic dysfunction, 5/2 edema resolved, continue to monitor off diuretics for now  HIV: w/ progression to AIDS. Poor historian and does not have capacity to make decisions as per psych. Still waiting on guardianship hearing date. Continue on biktarvy . Continue on atovaquone  for PCP ppx  HIV associated neurocognitive disorder: MRI did not show any enhancing lesions but did show severe cerebral atrophy likely secondary to HIV/AIDS.    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 Continue w/ conservative management   Diarrhea: Resolved    Hypotension: resolved. Holding midodrine    Poor dental hygiene, one left maxillary tooth fell off on 4/23 Started oral rinse nightly for 2 weeks  Depression due to prolonged stay in the hospital and unable to see significant other. Psych consulted for further recommendation.  5/3 c/o chest pain, troponin x 2 negative, EKG no significant changes. Most likely indigestion, patient is already on PPI Use Maalox as needed   Overweight, BMI 30 Significant weight gain during hospital stay Nutritionist consulted  Social issues: homeless and has polysubstance use disorder (alcohol, cocaine).  Pt's boyfriend/fiance' is not able to visit the pt anymore b/c concern of him bringing the pt alcohol and/or illicit drugs. Still awaiting guardianship. No safe d/c plan    DVT prophylaxis: lovenox  Code Status: full  Family Communication:  Disposition Plan: awaiting  guardianship still. Unsafe d/c plan   Level of care: Med-Surg Consultants:  Psych  Ortho surg  ID  Procedures:  Antimicrobials:   Subjective: No significant events overnight, patient was laying  comfortably in the bed.   Objective: Vitals:   04/15/24 1414 04/15/24 2019 04/16/24 0448 04/16/24 0500  BP: (!) 135/97 104/72 111/72   Pulse: (!) 104 (!) 102 91   Resp: 16 20 20    Temp:  98.4 F (36.9 C) 98.2 F (36.8 C)   TempSrc:  Oral    SpO2: 97% 98% 97%   Weight:    91 kg  Height:        Intake/Output Summary (Last 24 hours) at 04/16/2024 1649 Last data filed at 04/16/2024 1428 Gross per 24 hour  Intake 480 ml  Output --  Net 480 ml    Filed Weights   04/13/24 0349 04/15/24 0337 04/16/24 0500  Weight: 89.5 kg 90.6 kg 91 kg    Examination:  General exam: Appears calm & comfortable  Respiratory system: clear breath sounds b/l  Cardiovascular system: S1 & S2+. No rubs or gallops   Gastrointestinal system: abd is soft, NT, ND & hypoactive bowel sounds  Central nervous system: alert & awake. Moves all extremities  Psychiatry: judgement and insight appears poor. Flat mood and affect Extremities: mild pedal edema, no calf tenderness.     Data Reviewed: I have personally reviewed following labs and imaging studies  CBC: No results for input(s): "WBC", "NEUTROABS", "HGB", "HCT", "MCV", "PLT" in the last 168 hours.  Basic Metabolic Panel: Recent Labs  Lab 04/10/24 0543 04/11/24 0539 04/12/24 0516 04/12/24 0517 04/13/24 0624  NA 138 136  --  138 137  K 3.3* 4.0  --  3.5 3.6  CL 100 100  --  101 103  CO2 31 27  --  26 25  GLUCOSE 125* 110*  --  124* 119*  BUN 22* 20  --  19 15  CREATININE 0.95 0.89  --  0.74 0.71  CALCIUM  9.1 9.7  --  9.3 9.1  MG  --   --  1.9  --   --    GFR: Estimated Creatinine Clearance: 100.3 mL/min (by C-G formula based on SCr of 0.71 mg/dL). Liver Function Tests: No results for input(s): "AST", "ALT", "ALKPHOS", "BILITOT", "PROT", "ALBUMIN" in the last 168 hours.  No results for input(s): "LIPASE", "AMYLASE" in the last 168 hours. No results for input(s): "AMMONIA" in the last 168 hours. Coagulation Profile: No results for  input(s): "INR", "PROTIME" in the last 168 hours. Cardiac Enzymes: No results for input(s): "CKTOTAL", "CKMB", "CKMBINDEX", "TROPONINI" in the last 168 hours. BNP (last 3 results) No results for input(s): "PROBNP" in the last 8760 hours. HbA1C: No results for input(s): "HGBA1C" in the last 72 hours. CBG: No results for input(s): "GLUCAP" in the last 168 hours. Lipid Profile: No results for input(s): "CHOL", "HDL", "LDLCALC", "TRIG", "CHOLHDL", "LDLDIRECT" in the last 72 hours. Thyroid Function Tests: No results for input(s): "TSH", "T4TOTAL", "FREET4", "T3FREE", "THYROIDAB" in the last 72 hours. Anemia Panel: No results for input(s): "VITAMINB12", "FOLATE", "FERRITIN", "TIBC", "IRON", "RETICCTPCT" in the last 72 hours. Sepsis Labs: No results for input(s): "PROCALCITON", "LATICACIDVEN" in the last 168 hours.  No results found for this or any previous visit (from the past 240 hours).    Radiology Studies: No results found.    Scheduled Meds:  atovaquone   1,500 mg Oral Q breakfast   bictegravir-emtricitabine -tenofovir  AF  1 tablet Oral Daily  chlorhexidine   15 mL Mouth/Throat BID   DULoxetine   60 mg Oral Daily   enoxaparin  (LOVENOX ) injection  40 mg Subcutaneous QHS   folic acid   1 mg Oral Daily   hydrocerin   Topical BID   lidocaine   1 patch Transdermal Q24H   lipase/protease/amylase  24,000 Units Oral TID AC   multivitamin with minerals  1 tablet Oral QHS   pantoprazole   40 mg Oral BID   senna-docusate  2 tablet Oral BID   thiamine   100 mg Oral Daily   Continuous Infusions:     LOS: 130 days     Althia Atlas, MD [facility] Pager [phone]  If 7PM-7AM, please contact night-coverage [email or web address] 04/16/2024, 4:49 PM

## 2024-04-17 DIAGNOSIS — F02818 Dementia in other diseases classified elsewhere, unspecified severity, with other behavioral disturbance: Secondary | ICD-10-CM | POA: Diagnosis not present

## 2024-04-17 DIAGNOSIS — B20 Human immunodeficiency virus [HIV] disease: Secondary | ICD-10-CM | POA: Diagnosis not present

## 2024-04-17 LAB — C-REACTIVE PROTEIN: CRP: 0.5 mg/dL (ref <1.0)

## 2024-04-17 LAB — SEDIMENTATION RATE: Sed Rate: 52 mm/h — ABNORMAL HIGH (ref 0–20)

## 2024-04-17 NOTE — Progress Notes (Signed)
 PROGRESS NOTE   Meghan Jarvis  NFA:[id number] DOB: 12-30-1975 DOA: 12/08/2023 [name]    HPI was taken from NP [name]: 48 y.o with significant PMH of EtOH abuse ,Cocaine use, Homelessness, Uncontrolled HIV, on BIKTARVY , A-fib, GERD, Hypothyroidism, Diabetes, who presented to the ED with chief complaints of unresponsiveness.   Per ED reports, EMS report that patient was boarding at a friend's house who noticed that she had not woken up the whole day. Patient's friend attempted to wake her up but noticed that she was unresponsive and making "gurgling" sound so they called EMS. On EMS arrival, patient was found to be unconscious and incontinent of urine and feces.     ED Course: Initial vital signs showed HR of 118 beats/minute, BP 90/60 mm Hg, the RR 25 breaths/minute, and the oxygen saturation 98% on NRB and a temperature of 100.84F. Patient was obtunded with no gag reflex with debris around the mouth and nose concerning for stomach contents. She was emergently intubated for airway protection. Pertinent Labs/Diagnostics Findings: Na+/ K+:140/2.6  Glucose: 117 BUN/Cr.:33/1.18 AST/ALT:45/17 WBC:9.6 K/L PCT: 2.2 Lactic acid: 1.1 COVID PCR: Negative,  ABG: pO2 139; pCO2 27; pH 7.51;  HCO3 21.5, %O2 Sat 100.  CXR> see full report  Medication administered in the ED: Patient given 30 cc/kg of fluids and started on broad-spectrum antibiotics Ceftriaxone  and Azithromycin  for suspected sepsis Disposition:ICU   As per Dr. Broadus Canes 4/16-4/22/25: Pt has remained medically stable. Still awaiting guardianship hearing date. 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 twice daily, x 3 doses given s/p torsemide  40 mg p.o. daily, which was discontinued. BNP 70.7 wnl  Continue fluid restriction 1.5 L/day TTE LVEF 55 to 60%, grade 1 diastolic dysfunction, 5/2 edema resolved, continue to monitor off diuretics for now  HIV: w/ progression to AIDS. Poor historian and does not have capacity to make decisions as per psych. Still waiting on guardianship hearing date. Continue on biktarvy . Continue on atovaquone  for PCP ppx  HIV associated neurocognitive disorder: MRI did not show any enhancing lesions but did show severe cerebral atrophy likely secondary to HIV/AIDS.    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 Continue w/ conservative management   Diarrhea: Resolved    Hypotension: resolved. Holding midodrine    Poor dental hygiene, one left maxillary tooth fell off on 4/23 Started oral rinse nightly for 2 weeks  Depression due to prolonged stay in the hospital and unable to see significant other. Psych consulted for further recommendation.  5/3 c/o chest pain, troponin x 2 negative, EKG no significant changes. Most likely indigestion, patient is already on PPI Use Maalox as needed   Overweight, BMI 30 Significant weight gain during hospital stay Nutritionist consulted  Social issues: homeless and has polysubstance use disorder (alcohol, cocaine).  Pt's boyfriend/fiance' is not able to visit the pt anymore b/c concern of him bringing the pt alcohol and/or illicit drugs. Still awaiting guardianship. No safe d/c plan    DVT prophylaxis: lovenox  Code Status: full  Family Communication:  Disposition Plan: awaiting  guardianship still. Unsafe d/c plan   Level of care: Med-Surg Consultants:  Psych  Ortho surg  ID  Procedures:  Antimicrobials:   Subjective: No significant events overnight, patient was laying  comfortably in the bed.  She stated everything is fine, no concerns.   Objective: Vitals:   04/16/24 0500 04/16/24 2129 04/17/24 0458 04/17/24 0731  BP:  111/82 101/84 111/80  Pulse:  92 92 90  Resp:  16 16 16   Temp:  98.1 F (36.7 C) 98.4 F (36.9 C) 98 F (36.7 C)  TempSrc:  Oral Oral   SpO2:  100% 95% 99%  Weight: 91 kg     Height:        Intake/Output Summary (Last 24 hours) at 04/17/2024 1207 Last data filed at 04/17/2024 1100 Gross per 24 hour  Intake 1080 ml  Output --  Net 1080 ml    Filed Weights   04/13/24 0349 04/15/24 0337 04/16/24 0500  Weight: 89.5 kg 90.6 kg 91 kg    Examination:  General exam: Appears calm & comfortable  Respiratory system: clear breath sounds b/l  Cardiovascular system: S1 & S2+. No rubs or gallops   Gastrointestinal system: abd is soft, NT, ND & hypoactive bowel sounds  Central nervous system: alert & awake. Moves all extremities  Psychiatry: judgement and insight appears poor. Flat mood and affect Extremities: mild pedal edema, no calf tenderness.     Data Reviewed: I have personally reviewed following labs and imaging studies  CBC: No results for input(s): "WBC", "NEUTROABS", "HGB", "HCT", "MCV", "PLT" in the last 168 hours.  Basic Metabolic Panel: Recent Labs  Lab 04/11/24 0539 04/12/24 0516 04/12/24 0517 04/13/24 0624  NA 136  --  138 137  K 4.0  --  3.5 3.6  CL 100  --  101 103  CO2 27  --  26 25  GLUCOSE 110*  --  124* 119*  BUN 20  --  19 15  CREATININE 0.89  --  0.74 0.71  CALCIUM  9.7  --  9.3 9.1  MG  --  1.9  --   --    GFR: Estimated Creatinine Clearance: 100.3 mL/min (by C-G formula based on SCr of 0.71 mg/dL). Liver Function Tests: No results for input(s): "AST", "ALT", "ALKPHOS", "BILITOT", "PROT", "ALBUMIN" in the last 168 hours.  No results for input(s): "LIPASE", "AMYLASE" in the last 168 hours. No results for input(s): "AMMONIA" in the last 168 hours. Coagulation Profile: No results for input(s):  "INR", "PROTIME" in the last 168 hours. Cardiac Enzymes: No results for input(s): "CKTOTAL", "CKMB", "CKMBINDEX", "TROPONINI" in the last 168 hours. BNP (last 3 results) No results for input(s): "PROBNP" in the last 8760 hours. HbA1C: No results for input(s): "HGBA1C" in the last 72 hours. CBG: No results for input(s): "GLUCAP" in the last 168 hours. Lipid Profile: No results for input(s): "CHOL", "HDL", "LDLCALC", "TRIG", "CHOLHDL", "LDLDIRECT" in the last 72 hours. Thyroid Function Tests: No results for input(s): "TSH", "T4TOTAL", "FREET4", "T3FREE", "THYROIDAB" in the last 72 hours. Anemia Panel: No results for input(s): "VITAMINB12", "FOLATE", "FERRITIN", "TIBC", "IRON", "RETICCTPCT" in the last 72 hours. Sepsis Labs: No results for input(s): "PROCALCITON", "LATICACIDVEN" in the last 168 hours.  No results found for this or any previous visit (from the past 240 hours).    Radiology Studies: No results found.    Scheduled Meds:  atovaquone   1,500 mg Oral Q breakfast   bictegravir-emtricitabine -tenofovir  AF  1 tablet Oral Daily   chlorhexidine   15  mL Mouth/Throat BID   DULoxetine   60 mg Oral Daily   enoxaparin  (LOVENOX ) injection  40 mg Subcutaneous QHS   folic acid   1 mg Oral Daily   hydrocerin   Topical BID   lidocaine   1 patch Transdermal Q24H   lipase/protease/amylase  24,000 Units Oral TID AC   multivitamin with minerals  1 tablet Oral QHS   pantoprazole   40 mg Oral BID   senna-docusate  2 tablet Oral BID   thiamine   100 mg Oral Daily   Continuous Infusions:     LOS: 131 days     Althia Atlas, MD [facility] Pager [phone]  If 7PM-7AM, please contact night-coverage [email or web address] 04/17/2024, 12:07 PM

## 2024-04-17 NOTE — Plan of Care (Signed)
   Problem: Coping: Goal: Ability to adjust to condition or change in health will improve Outcome: Progressing   Problem: Fluid Volume: Goal: Ability to maintain a balanced intake and output will improve Outcome: Progressing

## 2024-04-17 NOTE — TOC Progression Note (Addendum)
 Transition of Care [facility]) - Progression Note    Patient Details  Name: LEELANI NEDLEY MRN: [id number] Date of Birth: 1976-10-23  Transition of Care [facility]) CM/SW Contact  Odilia Bennett, LCSW Phone Number: 04/17/2024, 9:27 AM  Clinical Narrative:   CSW sent secure email to APS supervisor and social worker to see if there are any updates.  3:17 pm: Received response from APS supervisor stating they would let me know.  Expected Discharge Plan and Services         Expected Discharge Date: 01/10/24                                     Social Determinants of Health (SDOH) Interventions SDOH Screenings   Food Insecurity: Patient Unable To Answer (12/09/2023)  Recent Concern: Food Insecurity - Food Insecurity Present (09/26/2023)  Housing: Patient Unable To Answer (12/09/2023)  Recent Concern: Housing - Medium Risk (09/26/2023)  Transportation Needs: Patient Unable To Answer (12/09/2023)  Recent Concern: Transportation Needs - Unmet Transportation Needs (10/11/2023)  Utilities: Patient Unable To Answer (12/09/2023)  Recent Concern: Utilities - At Risk (09/25/2023)  Tobacco Use: High Risk (12/08/2023)    Readmission Risk Interventions     No data to display

## 2024-04-18 DIAGNOSIS — R531 Weakness: Secondary | ICD-10-CM | POA: Diagnosis not present

## 2024-04-18 DIAGNOSIS — Z008 Encounter for other general examination: Secondary | ICD-10-CM

## 2024-04-18 DIAGNOSIS — B20 Human immunodeficiency virus [HIV] disease: Secondary | ICD-10-CM | POA: Diagnosis not present

## 2024-04-18 DIAGNOSIS — E11649 Type 2 diabetes mellitus with hypoglycemia without coma: Secondary | ICD-10-CM | POA: Diagnosis not present

## 2024-04-18 DIAGNOSIS — F1010 Alcohol abuse, uncomplicated: Secondary | ICD-10-CM | POA: Diagnosis not present

## 2024-04-18 LAB — T-HELPER CELLS CD4/CD8 %
% CD 4 Pos. Lymph.: 19.9 % — ABNORMAL LOW (ref 30.8–58.5)
Absolute CD 4 Helper: 338 /uL — ABNORMAL LOW (ref 359–1519)
Basophils Absolute: 0 10*3/uL (ref 0.0–0.2)
Basos: 1 %
CD3+CD4+ Cells/CD3+CD8+ Cells Bld: 0.42 — ABNORMAL LOW (ref 0.92–3.72)
CD3+CD8+ Cells # Bld: 809 /uL (ref 109–897)
CD3+CD8+ Cells NFr Bld: 47.6 % — ABNORMAL HIGH (ref 12.0–35.5)
EOS (ABSOLUTE): 0.6 10*3/uL — ABNORMAL HIGH (ref 0.0–0.4)
Eos: 14 %
Hematocrit: 32 % — ABNORMAL LOW (ref 34.0–46.6)
Hemoglobin: 10.3 g/dL — ABNORMAL LOW (ref 11.1–15.9)
Immature Grans (Abs): 0 10*3/uL (ref 0.0–0.1)
Immature Granulocytes: 1 %
Lymphocytes Absolute: 1.7 10*3/uL (ref 0.7–3.1)
Lymphs: 38 %
MCH: 26.2 pg — ABNORMAL LOW (ref 26.6–33.0)
MCHC: 32.2 g/dL (ref 31.5–35.7)
MCV: 81 fL (ref 79–97)
Monocytes Absolute: 0.4 10*3/uL (ref 0.1–0.9)
Monocytes: 9 %
Neutrophils Absolute: 1.6 10*3/uL (ref 1.4–7.0)
Neutrophils: 37 %
Platelets: 325 10*3/uL (ref 150–450)
RBC: 3.93 x10E6/uL (ref 3.77–5.28)
RDW: 13 % (ref 11.7–15.4)
WBC: 4.3 10*3/uL (ref 3.4–10.8)

## 2024-04-18 LAB — HIV-1 RNA QUANT-NO REFLEX-BLD
HIV 1 RNA Quant: 40 {copies}/mL
LOG10 HIV-1 RNA: 1.602 {Log_copies}/mL

## 2024-04-18 NOTE — Plan of Care (Signed)
  Problem: Coping: Goal: Ability to adjust to condition or change in health will improve Outcome: Progressing   Problem: Fluid Volume: Goal: Ability to maintain a balanced intake and output will improve Outcome: Progressing   Problem: Health Behavior/Discharge Planning: Goal: Ability to identify and utilize available resources and services will improve Outcome: Progressing Goal: Ability to manage health-related needs will improve Outcome: Progressing   Problem: Metabolic: Goal: Ability to maintain appropriate glucose levels will improve Outcome: Progressing   Problem: Nutritional: Goal: Maintenance of adequate nutrition will improve Outcome: Progressing Goal: Progress toward achieving an optimal weight will improve Outcome: Progressing   Problem: Skin Integrity: Goal: Risk for impaired skin integrity will decrease Outcome: Progressing   Problem: Tissue Perfusion: Goal: Adequacy of tissue perfusion will improve Outcome: Progressing   Problem: Education: Goal: Knowledge of General Education information will improve Description: Including pain rating scale, medication(s)/side effects and non-pharmacologic comfort measures Outcome: Progressing   Problem: Health Behavior/Discharge Planning: Goal: Ability to manage health-related needs will improve Outcome: Progressing   Problem: Clinical Measurements: Goal: Ability to maintain clinical measurements within normal limits will improve Outcome: Progressing Goal: Will remain free from infection Outcome: Progressing Goal: Diagnostic test results will improve Outcome: Progressing Goal: Respiratory complications will improve Outcome: Progressing Goal: Cardiovascular complication will be avoided Outcome: Progressing   Problem: Activity: Goal: Risk for activity intolerance will decrease Outcome: Progressing   Problem: Nutrition: Goal: Adequate nutrition will be maintained Outcome: Progressing   Problem: Coping: Goal:  Level of anxiety will decrease Outcome: Progressing   Problem: Elimination: Goal: Will not experience complications related to bowel motility Outcome: Progressing Goal: Will not experience complications related to urinary retention Outcome: Progressing   Problem: Pain Management: Goal: General experience of comfort will improve Outcome: Progressing   Problem: Safety: Goal: Ability to remain free from injury will improve Outcome: Progressing   Problem: Skin Integrity: Goal: Risk for impaired skin integrity will decrease Outcome: Progressing   Problem: Activity: Goal: Ability to tolerate increased activity will improve Outcome: Progressing   Problem: Respiratory: Goal: Ability to maintain a clear airway and adequate ventilation will improve Outcome: Progressing

## 2024-04-18 NOTE — Progress Notes (Signed)
 Progress Note   Patient: Meghan Jarvis NWG:[id number] DOB: [birth year] DOA: 12/08/2023     132 DOS: the patient was seen and examined on 04/18/2024   Brief hospital course: HPI/Hospital Course: 47yo with h/o polysubstance (ETOH, cocaine) abuse, afib, homelessness, AIDS, hypothyroidism, and DM who presented on 12/08/23 with acute respiratory failure.  She was found down by a friend and could not wake her up.  She was found to be hypoxic in the 70s. Admitted to ICU, intubated, started on antibiotics for pneumonia.  12/31: extubated. ID consulted.  MRI brain with advanced cerebral atrophy.  Psych evaluated - "Patient is lacking and reasoning in her medical care for example she is unable to describe the consequence that she will have if she does not take her medications or has complications with her chronic conditions" and needs assistance with housing and finances due to cognitive decline.  APS/DSS pending guardianship and will need placement.  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 On Mepron  for PCP prophylaxis.  Encounter for assessment of decision-making capacity She lacks capacity to make medical decisions. TOC still attempting to pursue legal guardianship with the court system.  AIDS (acquired immune deficiency syndrome) (HCC) On Biktarvy . On Mepron  for PCP prophylaxis.  Lab Results  Component Value Date/Time   HIV1RNAQUANT 60 03/18/2024 05:03 AM   Last CD4 count of 03-18-2024 was 416  Cocaine abuse (HCC) Advised to quit using illicit drugs.  Protein calorie malnutrition (HCC) Nutrition Status: Nutrition Problem: Severe Malnutrition Etiology: chronic illness (HIV, homelessness, substance abuse) Signs/Symptoms: percent weight loss, severe fat depletion, severe muscle depletion Percent weight loss: 35 %   AKI (acute kidney injury)  (HCC)-resolved   Right knee/ leg pain- continue pain control with lidocaine , tylenol .  Abdominal pain-resolved   Hypotension-resolved  Off midodrine  therapy  Diarrhea- resolved as of 03/22/2024 Likely overflow constipation causing her diarrhea.  Patient did not like lactulose  or MiraLAX .  Continue Colace.  Generalized weakness-resolved as of 03/22/2024 Physical therapy recommending rehab  Pancytopenia (HCC)- resolved.  Obesity Class 1- BMI 31.39 Encouraged weight reduction.     Out of bed to chair. Incentive spirometry. Nursing supportive care. Fall, aspiration precautions. Diet:  Diet Orders (From admission, onward)     Start     Ordered   04/08/24 1449  DIET DYS 3 Room service appropriate? Yes; Fluid consistency: Thin; Fluid restriction: 1500 mL Fluid  Diet effective now       Question Answer Comment  Room service appropriate? Yes   Fluid consistency: Thin   Fluid restriction: 1500 mL Fluid      04/08/24 1448           DVT prophylaxis: enoxaparin  (LOVENOX ) injection 40 mg Start: 12/11/23 2200  Level of care: Med-Surg   Code Status: Full Code  Subjective: Patient is seen and examined today morning. She complained of right leg pain, advised to take tylenol . She has lidocaine  patch on.  Physical Exam: Vitals:   04/18/24 0337 04/18/24 0500 04/18/24 0839 04/18/24 1555  BP: (!) 122/91  118/77 126/75  Pulse: (!) 101  97 98  Resp: 18  18 18   Temp: 98.6 F (37 C)  98.2 F (36.8 C) 98.3 F (36.8 C)  TempSrc: Oral  Oral Oral  SpO2: 99%  100% 100%  Weight:  93.6 kg    Height:        General - Middle aged obese African American  female, no apparent distress HEENT - PERRLA, EOMI, atraumatic head, non tender sinuses. Lung - Clear, no rales, rhonchi, wheezes. Heart - S1, S2 heard, no murmurs, rubs, trace pedal edema. Abdomen - Soft, non tender, obese, bowel sounds good Neuro - Alert, awake and oriented x 3, non focal exam. Skin - Warm and dry.  Data  Reviewed:      Latest Ref Rng & Units 04/17/2024    6:14 AM 04/09/2024    5:47 AM 04/03/2024    4:40 AM  CBC  WBC 3.4 - 10.8 x10E3/uL 4.3  4.5  4.4   Hemoglobin 11.1 - 15.9 g/dL 82.9  56.2  9.5   Hematocrit 34.0 - 46.6 % 32.0  30.3  30.1   Platelets 150 - 450 x10E3/uL 325  315  295       Latest Ref Rng & Units 04/13/2024    6:24 AM 04/12/2024    5:17 AM 04/11/2024    5:39 AM  BMP  Glucose 70 - 99 mg/dL 130  865  784   BUN 6 - 20 mg/dL 15  19  20    Creatinine 0.44 - 1.00 mg/dL 6.96  2.95  2.84   Sodium 135 - 145 mmol/L 137  138  136   Potassium 3.5 - 5.1 mmol/L 3.6  3.5  4.0   Chloride 98 - 111 mmol/L 103  101  100   CO2 22 - 32 mmol/L 25  26  27    Calcium  8.9 - 10.3 mg/dL 9.1  9.3  9.7    No results found.  Family Communication: Discussed with patient, she understand and agree. All questions answered.  Disposition: Status is: Inpatient Remains inpatient appropriate because: awaiting guardianship, placement  Planned Discharge Destination: Skilled nursing facility     Time spent: 38 minutes  Author: Aisha Hove, MD 04/18/2024 5:04 PM Secure chat 7am to 7pm For on call review [email or web address].

## 2024-04-19 DIAGNOSIS — B20 Human immunodeficiency virus [HIV] disease: Secondary | ICD-10-CM | POA: Diagnosis not present

## 2024-04-19 DIAGNOSIS — F1010 Alcohol abuse, uncomplicated: Secondary | ICD-10-CM | POA: Diagnosis not present

## 2024-04-19 DIAGNOSIS — E11649 Type 2 diabetes mellitus with hypoglycemia without coma: Secondary | ICD-10-CM | POA: Diagnosis not present

## 2024-04-19 DIAGNOSIS — R531 Weakness: Secondary | ICD-10-CM | POA: Diagnosis not present

## 2024-04-19 NOTE — Progress Notes (Signed)
 Progress Note   Patient: Meghan Jarvis ZOX:[id number] DOB: 08/18/1976 DOA: 12/08/2023     133 DOS: the patient was seen and examined on 04/19/2024   Brief hospital course: HPI/Hospital Course: 47yo with h/o polysubstance (ETOH, cocaine) abuse, afib, homelessness, AIDS, hypothyroidism, and DM who presented on 12/08/23 with acute respiratory failure.  She was found down by a friend and could not wake her up.  She was found to be hypoxic in the 70s. Admitted to ICU, intubated, started on antibiotics for pneumonia.  12/31: extubated. ID consulted.  MRI brain with advanced cerebral atrophy.  Psych evaluated - "Patient is lacking and reasoning in her medical care for example she is unable to describe the consequence that she will have if she does not take her medications or has complications with her chronic conditions" and needs assistance with housing and finances due to cognitive decline.  APS/DSS pending guardianship and will need placement.  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 On Mepron  for PCP prophylaxis.  Encounter for assessment of decision-making capacity She lacks capacity to make medical decisions. TOC still attempting to pursue legal guardianship with the court system.  AIDS (acquired immune deficiency syndrome) (HCC) On Biktarvy . On Mepron  for PCP prophylaxis.  Lab Results  Component Value Date/Time   HIV1RNAQUANT 60 03/18/2024 05:03 AM   Last CD4 count of 03-18-2024 was 416  Cocaine abuse (HCC) Advised to quit using illicit drugs.  AKI (acute kidney injury) (HCC)-resolved.  Right knee/ leg pain- continue pain control with lidocaine , tylenol .  Abdominal pain-resolved   Hypotension-resolved  Off midodrine  therapy  Diarrhea- resolved as of 03/22/2024 Likely overflow constipation causing her diarrhea.  Patient did not like  lactulose  or MiraLAX .  Continue Colace.  Generalized weakness-resolved as of 03/22/2024 Physical therapy recommending rehab  Pancytopenia (HCC)- resolved.  Obesity Class 1- BMI 31.39 Encouraged weight reduction.     Out of bed to chair. Incentive spirometry. Nursing supportive care. Fall, aspiration precautions. Diet:  Diet Orders (From admission, onward)     Start     Ordered   04/08/24 1449  DIET DYS 3 Room service appropriate? Yes; Fluid consistency: Thin; Fluid restriction: 1500 mL Fluid  Diet effective now       Question Answer Comment  Room service appropriate? Yes   Fluid consistency: Thin   Fluid restriction: 1500 mL Fluid      04/08/24 1448           DVT prophylaxis: enoxaparin  (LOVENOX ) injection 40 mg Start: 12/11/23 2200  Level of care: Med-Surg   Code Status: Full Code  Subjective: Patient is seen and examined today morning. She is sleeping comfortably, no overnight issues.  Physical Exam: Vitals:   04/18/24 2035 04/19/24 0420 04/19/24 0738 04/19/24 1609  BP: 97/62 120/82 113/80 110/83  Pulse: 99 95 88 (!) 108  Resp: 18 16 16 16   Temp: 98.6 F (37 C) 98.6 F (37 C) 98.3 F (36.8 C) 98.3 F (36.8 C)  TempSrc:  Oral    SpO2: 99% 100% 98% 99%  Weight:  90.5 kg    Height:        General - Middle aged obese African American female, no apparent distress HEENT - PERRLA, EOMI, atraumatic head, non tender sinuses. Lung - Clear, no rales, rhonchi, wheezes. Heart - S1, S2 heard, no murmurs, rubs, trace pedal edema. Abdomen - Soft, non tender, obese, bowel sounds good Neuro -  Alert, awake and oriented x 3, non focal exam. Skin - Warm and dry.  Data Reviewed:      Latest Ref Rng & Units 04/17/2024    6:14 AM 04/09/2024    5:47 AM 04/03/2024    4:40 AM  CBC  WBC 3.4 - 10.8 x10E3/uL 4.3  4.5  4.4   Hemoglobin 11.1 - 15.9 g/dL 16.1  09.6  9.5   Hematocrit 34.0 - 46.6 % 32.0  30.3  30.1   Platelets 150 - 450 x10E3/uL 325  315  295       Latest  Ref Rng & Units 04/13/2024    6:24 AM 04/12/2024    5:17 AM 04/11/2024    5:39 AM  BMP  Glucose 70 - 99 mg/dL 045  409  811   BUN 6 - 20 mg/dL 15  19  20    Creatinine 0.44 - 1.00 mg/dL 9.14  7.82  9.56   Sodium 135 - 145 mmol/L 137  138  136   Potassium 3.5 - 5.1 mmol/L 3.6  3.5  4.0   Chloride 98 - 111 mmol/L 103  101  100   CO2 22 - 32 mmol/L 25  26  27    Calcium  8.9 - 10.3 mg/dL 9.1  9.3  9.7    No results found.  Disposition: Status is: Inpatient Remains inpatient appropriate because: awaiting guardianship, placement  Planned Discharge Destination: Skilled nursing facility     Time spent: 36 minutes  Author: Aisha Hove, MD 04/19/2024 4:50 PM Secure chat 7am to 7pm For on call review [email or web address].

## 2024-04-19 NOTE — Plan of Care (Signed)
  Problem: Coping: Goal: Ability to adjust to condition or change in health will improve Outcome: Progressing   Problem: Fluid Volume: Goal: Ability to maintain a balanced intake and output will improve Outcome: Progressing   Problem: Health Behavior/Discharge Planning: Goal: Ability to identify and utilize available resources and services will improve Outcome: Progressing Goal: Ability to manage health-related needs will improve Outcome: Progressing   Problem: Metabolic: Goal: Ability to maintain appropriate glucose levels will improve Outcome: Progressing   Problem: Nutritional: Goal: Maintenance of adequate nutrition will improve Outcome: Progressing Goal: Progress toward achieving an optimal weight will improve Outcome: Progressing   Problem: Skin Integrity: Goal: Risk for impaired skin integrity will decrease Outcome: Progressing   Problem: Tissue Perfusion: Goal: Adequacy of tissue perfusion will improve Outcome: Progressing   Problem: Education: Goal: Knowledge of General Education information will improve Description: Including pain rating scale, medication(s)/side effects and non-pharmacologic comfort measures Outcome: Progressing   Problem: Health Behavior/Discharge Planning: Goal: Ability to manage health-related needs will improve Outcome: Progressing   Problem: Clinical Measurements: Goal: Ability to maintain clinical measurements within normal limits will improve Outcome: Progressing Goal: Will remain free from infection Outcome: Progressing Goal: Diagnostic test results will improve Outcome: Progressing Goal: Respiratory complications will improve Outcome: Progressing Goal: Cardiovascular complication will be avoided Outcome: Progressing   Problem: Activity: Goal: Risk for activity intolerance will decrease Outcome: Progressing   Problem: Nutrition: Goal: Adequate nutrition will be maintained Outcome: Progressing   Problem: Coping: Goal:  Level of anxiety will decrease Outcome: Progressing   Problem: Elimination: Goal: Will not experience complications related to bowel motility Outcome: Progressing Goal: Will not experience complications related to urinary retention Outcome: Progressing   Problem: Pain Management: Goal: General experience of comfort will improve Outcome: Progressing   Problem: Safety: Goal: Ability to remain free from injury will improve Outcome: Progressing   Problem: Skin Integrity: Goal: Risk for impaired skin integrity will decrease Outcome: Progressing   Problem: Activity: Goal: Ability to tolerate increased activity will improve Outcome: Progressing   Problem: Respiratory: Goal: Ability to maintain a clear airway and adequate ventilation will improve Outcome: Progressing

## 2024-04-19 NOTE — Plan of Care (Signed)
  Problem: Coping: Goal: Ability to adjust to condition or change in health will improve Outcome: Progressing   Problem: Fluid Volume: Goal: Ability to maintain a balanced intake and output will improve Outcome: Progressing   Problem: Health Behavior/Discharge Planning: Goal: Ability to identify and utilize available resources and services will improve Outcome: Progressing Goal: Ability to manage health-related needs will improve Outcome: Progressing   Problem: Metabolic: Goal: Ability to maintain appropriate glucose levels will improve Outcome: Progressing   Problem: Nutritional: Goal: Maintenance of adequate nutrition will improve Outcome: Progressing Goal: Progress toward achieving an optimal weight will improve Outcome: Progressing   Problem: Skin Integrity: Goal: Risk for impaired skin integrity will decrease Outcome: Progressing   Problem: Tissue Perfusion: Goal: Adequacy of tissue perfusion will improve Outcome: Progressing   Problem: Education: Goal: Knowledge of General Education information will improve Description: Including pain rating scale, medication(s)/side effects and non-pharmacologic comfort measures Outcome: Progressing   Problem: Health Behavior/Discharge Planning: Goal: Ability to manage health-related needs will improve Outcome: Progressing   Problem: Clinical Measurements: Goal: Ability to maintain clinical measurements within normal limits will improve Outcome: Progressing Goal: Will remain free from infection Outcome: Progressing Goal: Diagnostic test results will improve Outcome: Progressing Goal: Respiratory complications will improve Outcome: Progressing Goal: Cardiovascular complication will be avoided Outcome: Progressing   Problem: Activity: Goal: Risk for activity intolerance will decrease Outcome: Progressing   Problem: Nutrition: Goal: Adequate nutrition will be maintained Outcome: Progressing

## 2024-04-20 DIAGNOSIS — B20 Human immunodeficiency virus [HIV] disease: Secondary | ICD-10-CM | POA: Diagnosis not present

## 2024-04-20 DIAGNOSIS — F02818 Dementia in other diseases classified elsewhere, unspecified severity, with other behavioral disturbance: Secondary | ICD-10-CM | POA: Diagnosis not present

## 2024-04-20 NOTE — Progress Notes (Signed)
 Progress Note   Patient: Meghan Jarvis:[id number] DOB: 07-Jul-1976 DOA: 12/08/2023     134 DOS: the patient was seen and examined on 04/20/2024   Brief hospital course: HPI/Hospital Course: 48yo with h/o polysubstance (ETOH, cocaine) abuse, afib, homelessness, AIDS, hypothyroidism, and DM who presented on 12/08/23 with acute respiratory failure.  She was found down by a friend and could not wake her up.  She was found to be hypoxic in the 70s. Admitted to ICU, intubated, started on antibiotics for pneumonia.  12/31: extubated. ID consulted.  MRI brain with advanced cerebral atrophy.  Psych evaluated - "Patient is lacking and reasoning in her medical care for example she is unable to describe the consequence that she will have if she does not take her medications or has complications with her chronic conditions" and needs assistance with housing and finances due to cognitive decline.  APS/DSS pending guardianship and will need placement.  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 On Mepron  for PCP prophylaxis.  Encounter for assessment of decision-making capacity She lacks capacity to make medical decisions. TOC still attempting to pursue legal guardianship with the court system.  AIDS (acquired immune deficiency syndrome) (HCC) On Biktarvy . On Mepron  for PCP prophylaxis.  Lab Results  Component Value Date/Time   HIV1RNAQUANT 60 03/18/2024 05:03 AM   Last CD4 count of 03-18-2024 was 416  Cocaine abuse (HCC) Advised to quit using illicit drugs.  AKI (acute kidney injury) (HCC)-resolved.  Right knee/ leg pain- continue pain control with lidocaine , tylenol .  Abdominal pain-resolved   Hypotension-resolved  Off midodrine  therapy  Diarrhea- resolved as of 03/22/2024 Likely overflow constipation causing her diarrhea.  Patient did not like  lactulose  or MiraLAX .  Continue Colace.  Generalized weakness-resolved as of 03/22/2024 Physical therapy recommending rehab  Pancytopenia (HCC)- resolved.  Obesity Class 1- BMI 31.39 Encouraged weight reduction.     Out of bed to chair. Incentive spirometry. Nursing supportive care. Fall, aspiration precautions. Diet:  Diet Orders (From admission, onward)     Start     Ordered   04/08/24 1449  DIET DYS 3 Room service appropriate? Yes; Fluid consistency: Thin; Fluid restriction: 1500 mL Fluid  Diet effective now       Question Answer Comment  Room service appropriate? Yes   Fluid consistency: Thin   Fluid restriction: 1500 mL Fluid      04/08/24 1448           DVT prophylaxis: enoxaparin  (LOVENOX ) injection 40 mg Start: 12/11/23 2200  Level of care: Med-Surg   Code Status: Full Code  Subjective: Patient is seen and examined today morning. She is sleeping comfortably, no overnight issues.  Physical Exam: Vitals:   04/19/24 0738 04/19/24 1609 04/20/24 0400 04/20/24 0900  BP: 113/80 110/83 112/82 (!) 103/90  Pulse: 88 (!) 108 99 98  Resp: 16 16 18 18   Temp: 98.3 F (36.8 C) 98.3 F (36.8 C) 98.1 F (36.7 C) 98.6 F (37 C)  TempSrc:   Oral Oral  SpO2: 98% 99% 99% 97%  Weight:      Height:        General - Middle aged obese African American female, no apparent distress HEENT - PERRLA, EOMI, atraumatic head, non tender sinuses. Lung - Clear, no rales, rhonchi, wheezes. Heart - S1, S2 heard, no murmurs, rubs, trace pedal edema. Abdomen - Soft, non tender, obese, bowel sounds good Neuro -  Alert, awake and oriented x 3, non focal exam. Skin - Warm and dry.  Data Reviewed:      Latest Ref Rng & Units 04/17/2024    6:14 AM 04/09/2024    5:47 AM 04/03/2024    4:40 AM  CBC  WBC 3.4 - 10.8 x10E3/uL 4.3  4.5  4.4   Hemoglobin 11.1 - 15.9 g/dL 09.8  11.9  9.5   Hematocrit 34.0 - 46.6 % 32.0  30.3  30.1   Platelets 150 - 450 x10E3/uL 325  315  295        Latest Ref Rng & Units 04/13/2024    6:24 AM 04/12/2024    5:17 AM 04/11/2024    5:39 AM  BMP  Glucose 70 - 99 mg/dL 147  829  562   BUN 6 - 20 mg/dL 15  19  20    Creatinine 0.44 - 1.00 mg/dL 1.30  8.65  7.84   Sodium 135 - 145 mmol/L 137  138  136   Potassium 3.5 - 5.1 mmol/L 3.6  3.5  4.0   Chloride 98 - 111 mmol/L 103  101  100   CO2 22 - 32 mmol/L 25  26  27    Calcium  8.9 - 10.3 mg/dL 9.1  9.3  9.7    No results found.  Disposition: Status is: Inpatient Remains inpatient appropriate because: awaiting guardianship, placement  Planned Discharge Destination: Skilled nursing facility     Time spent: 25 minutes  Author: Althia Atlas, MD 04/20/2024 2:13 PM Secure chat 7am to 7pm For on call review [email or web address].

## 2024-04-20 NOTE — Plan of Care (Signed)
  Problem: Coping: Goal: Ability to adjust to condition or change in health will improve Outcome: Progressing   Problem: Fluid Volume: Goal: Ability to maintain a balanced intake and output will improve Outcome: Progressing   Problem: Health Behavior/Discharge Planning: Goal: Ability to identify and utilize available resources and services will improve Outcome: Progressing Goal: Ability to manage health-related needs will improve Outcome: Progressing   Problem: Metabolic: Goal: Ability to maintain appropriate glucose levels will improve Outcome: Progressing   Problem: Nutritional: Goal: Maintenance of adequate nutrition will improve Outcome: Progressing Goal: Progress toward achieving an optimal weight will improve Outcome: Progressing   Problem: Skin Integrity: Goal: Risk for impaired skin integrity will decrease Outcome: Progressing   Problem: Tissue Perfusion: Goal: Adequacy of tissue perfusion will improve Outcome: Progressing   Problem: Education: Goal: Knowledge of General Education information will improve Description: Including pain rating scale, medication(s)/side effects and non-pharmacologic comfort measures Outcome: Progressing   Problem: Health Behavior/Discharge Planning: Goal: Ability to manage health-related needs will improve Outcome: Progressing   Problem: Clinical Measurements: Goal: Ability to maintain clinical measurements within normal limits will improve Outcome: Progressing Goal: Will remain free from infection Outcome: Progressing Goal: Diagnostic test results will improve Outcome: Progressing Goal: Respiratory complications will improve Outcome: Progressing Goal: Cardiovascular complication will be avoided Outcome: Progressing   Problem: Activity: Goal: Risk for activity intolerance will decrease Outcome: Progressing   Problem: Nutrition: Goal: Adequate nutrition will be maintained Outcome: Progressing   Problem: Coping: Goal:  Level of anxiety will decrease Outcome: Progressing   Problem: Elimination: Goal: Will not experience complications related to bowel motility Outcome: Progressing Goal: Will not experience complications related to urinary retention Outcome: Progressing   Problem: Pain Management: Goal: General experience of comfort will improve Outcome: Progressing   Problem: Safety: Goal: Ability to remain free from injury will improve Outcome: Progressing   Problem: Skin Integrity: Goal: Risk for impaired skin integrity will decrease Outcome: Progressing   Problem: Activity: Goal: Ability to tolerate increased activity will improve Outcome: Progressing   Problem: Respiratory: Goal: Ability to maintain a clear airway and adequate ventilation will improve Outcome: Progressing

## 2024-04-21 DIAGNOSIS — F02818 Dementia in other diseases classified elsewhere, unspecified severity, with other behavioral disturbance: Secondary | ICD-10-CM | POA: Diagnosis not present

## 2024-04-21 DIAGNOSIS — B20 Human immunodeficiency virus [HIV] disease: Secondary | ICD-10-CM | POA: Diagnosis not present

## 2024-04-21 NOTE — TOC Progression Note (Addendum)
 Transition of Care [facility]) - Progression Note    Patient Details  Name: Meghan Jarvis MRN: [id number] Date of Birth: 07/17/1976  [facility]) CM/SW Contact  Odilia Bennett, LCSW Phone Number: 04/21/2024, 8:41 AM  Clinical Narrative:   Court hearing is scheduled for today at 12:00.  2:33 pm: CSW sent secure email to APS supervisor to check status of court hearing.  3:36 pm: Received call from DSS social worker Sentrell. They are now patient's interim guardian. Sentrell asked if patient was able to make any decisions outside of medical. She also needs to know how "in-depth" her neurocognitive disorder is. CSW sent secure chat to MD, RN, and psychiatrist.  4:01 pm: CSW sent psych note from 1/28 with [facility] to APS. CSW also provided information from RN about decision-making capabilities outside of medical.  Expected Discharge Plan and Services         Expected Discharge Date: 01/10/24                                     Social Determinants of Health (SDOH) Interventions SDOH Screenings   Food Insecurity: Patient Unable To Answer (12/09/2023)  Recent Concern: Food Insecurity - Food Insecurity Present (09/26/2023)  Housing: Patient Unable To Answer (12/09/2023)  Recent Concern: Housing - Medium Risk (09/26/2023)  Transportation Needs: Patient Unable To Answer (12/09/2023)  Recent Concern: Transportation Needs - Unmet Transportation Needs (10/11/2023)  Utilities: Patient Unable To Answer (12/09/2023)  Recent Concern: Utilities - At Risk (09/25/2023)  Tobacco Use: High Risk (12/08/2023)    Readmission Risk Interventions     No data to display

## 2024-04-21 NOTE — Progress Notes (Signed)
 Progress Note   Patient: Meghan Jarvis ZOX:[id number] DOB: 02-20-1976 DOA: 12/08/2023     135 DOS: the patient was seen and examined on 04/21/2024   Brief hospital course: HPI/Hospital Course: 47yo with h/o polysubstance (ETOH, cocaine) abuse, afib, homelessness, AIDS, hypothyroidism, and DM who presented on 12/08/23 with acute respiratory failure.  She was found down by a friend and could not wake her up.  She was found to be hypoxic in the 70s. Admitted to ICU, intubated, started on antibiotics for pneumonia.  12/31: extubated. ID consulted.  MRI brain with advanced cerebral atrophy.  Psych evaluated - "Patient is lacking and reasoning in her medical care for example she is unable to describe the consequence that she will have if she does not take her medications or has complications with her chronic conditions" and needs assistance with housing and finances due to cognitive decline.  APS/DSS pending guardianship and will need placement.  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 On Mepron  for PCP prophylaxis.  Encounter for assessment of decision-making capacity She lacks capacity to make medical decisions. TOC still attempting to pursue legal guardianship with the court system.  AIDS (acquired immune deficiency syndrome) (HCC) On Biktarvy . On Mepron  for PCP prophylaxis.  Lab Results  Component Value Date/Time   HIV1RNAQUANT 60 03/18/2024 05:03 AM   Last CD4 count of 03-18-2024 was 416  Cocaine abuse (HCC) Advised to quit using illicit drugs.  AKI (acute kidney injury) (HCC)-resolved.  Right knee/ leg pain- continue pain control with lidocaine , tylenol .  Abdominal pain-resolved   Hypotension-resolved  Off midodrine  therapy  Diarrhea- resolved as of 03/22/2024 Likely overflow constipation causing her diarrhea.  Patient did not like  lactulose  or MiraLAX .  Continue Colace.  Generalized weakness-resolved as of 03/22/2024 Physical therapy recommending rehab  Pancytopenia (HCC)- resolved.  Obesity Class 1- BMI 31.39 Encouraged weight reduction.     Out of bed to chair. Incentive spirometry. Nursing supportive care. Fall, aspiration precautions. Diet:  Diet Orders (From admission, onward)     Start     Ordered   04/08/24 1449  DIET DYS 3 Room service appropriate? Yes; Fluid consistency: Thin; Fluid restriction: 1500 mL Fluid  Diet effective now       Question Answer Comment  Room service appropriate? Yes   Fluid consistency: Thin   Fluid restriction: 1500 mL Fluid      04/08/24 1448           DVT prophylaxis: enoxaparin  (LOVENOX ) injection 40 mg Start: 12/11/23 2200  Level of care: Med-Surg   Code Status: Full Code  Subjective: No significant events overnight.  Patient was resting calmly, denied any complaints.   Physical Exam: Vitals:   04/20/24 1935 04/21/24 0359 04/21/24 0404 04/21/24 0900  BP: 117/82 110/76  116/83  Pulse: 100 91  97  Resp: 18 18  18   Temp: 98.3 F (36.8 C) 98.6 F (37 C)  98.5 F (36.9 C)  TempSrc: Oral Oral    SpO2: 99% 100%  98%  Weight:   92.6 kg   Height:        General - Middle aged obese African American female, no apparent distress HEENT - PERRLA, EOMI, atraumatic head, non tender sinuses. Lung - Clear, no rales, rhonchi, wheezes. Heart - S1, S2 heard, no murmurs, rubs, trace pedal edema. Abdomen - Soft, non tender, obese, bowel sounds good Neuro - Alert, awake and oriented  x 3, non focal exam. Skin - Warm and dry.  Data Reviewed:      Latest Ref Rng & Units 04/17/2024    6:14 AM 04/09/2024    5:47 AM 04/03/2024    4:40 AM  CBC  WBC 3.4 - 10.8 x10E3/uL 4.3  4.5  4.4   Hemoglobin 11.1 - 15.9 g/dL 16.1  09.6  9.5   Hematocrit 34.0 - 46.6 % 32.0  30.3  30.1   Platelets 150 - 450 x10E3/uL 325  315  295       Latest Ref Rng & Units 04/13/2024    6:24  AM 04/12/2024    5:17 AM 04/11/2024    5:39 AM  BMP  Glucose 70 - 99 mg/dL 045  409  811   BUN 6 - 20 mg/dL 15  19  20    Creatinine 0.44 - 1.00 mg/dL 9.14  7.82  9.56   Sodium 135 - 145 mmol/L 137  138  136   Potassium 3.5 - 5.1 mmol/L 3.6  3.5  4.0   Chloride 98 - 111 mmol/L 103  101  100   CO2 22 - 32 mmol/L 25  26  27    Calcium  8.9 - 10.3 mg/dL 9.1  9.3  9.7    No results found.  Disposition: Status is: Inpatient Remains inpatient appropriate because: awaiting guardianship, placement  Planned Discharge Destination: Skilled nursing facility     Time spent: 25 minutes  Author: Althia Atlas, MD 04/21/2024 3:21 PM Secure chat 7am to 7pm For on call review [email or web address]

## 2024-04-21 NOTE — Plan of Care (Signed)
  Problem: Coping: Goal: Ability to adjust to condition or change in health will improve Outcome: Progressing   Problem: Fluid Volume: Goal: Ability to maintain a balanced intake and output will improve Outcome: Progressing   Problem: Health Behavior/Discharge Planning: Goal: Ability to identify and utilize available resources and services will improve Outcome: Progressing Goal: Ability to manage health-related needs will improve Outcome: Progressing

## 2024-04-22 DIAGNOSIS — F02818 Dementia in other diseases classified elsewhere, unspecified severity, with other behavioral disturbance: Secondary | ICD-10-CM | POA: Diagnosis not present

## 2024-04-22 DIAGNOSIS — B20 Human immunodeficiency virus [HIV] disease: Secondary | ICD-10-CM | POA: Diagnosis not present

## 2024-04-22 NOTE — Progress Notes (Signed)
 Progress Note   Patient: Meghan Jarvis TKZ:[id number] DOB: 06/28/1976 DOA: 12/08/2023     136 DOS: the patient was seen and examined on 04/22/2024   Brief hospital course: HPI/Hospital Course: 47yo with h/o polysubstance (ETOH, cocaine) abuse, afib, homelessness, AIDS, hypothyroidism, and DM who presented on 12/08/23 with acute respiratory failure.  She was found down by a friend and could not wake her up.  She was found to be hypoxic in the 70s. Admitted to ICU, intubated, started on antibiotics for pneumonia.  12/31: extubated. ID consulted.  MRI brain with advanced cerebral atrophy.  Psych evaluated - "Patient is lacking and reasoning in her medical care for example she is unable to describe the consequence that she will have if she does not take her medications or has complications with her chronic conditions" and needs assistance with housing and finances due to cognitive decline.  APS/DSS pending guardianship and will need placement.  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 On Mepron  for PCP prophylaxis.  Encounter for assessment of decision-making capacity She lacks capacity to make medical decisions. TOC still attempting to pursue legal guardianship with the court system.  AIDS (acquired immune deficiency syndrome) (HCC) On Biktarvy . On Mepron  for PCP prophylaxis.  Lab Results  Component Value Date/Time   HIV1RNAQUANT 60 03/18/2024 05:03 AM   Last CD4 count of 03-18-2024 was 416  Cocaine abuse (HCC) Advised to quit using illicit drugs.  AKI (acute kidney injury) (HCC)-resolved.  Right knee/ leg pain- continue pain control with lidocaine , tylenol .  Abdominal pain-resolved   Hypotension-resolved  Off midodrine  therapy  Diarrhea- resolved as of 03/22/2024 Likely overflow constipation causing her diarrhea.  Patient did not like  lactulose  or MiraLAX .  Continue Colace.  Generalized weakness-resolved as of 03/22/2024 Physical therapy recommending rehab  Pancytopenia (HCC)- resolved.  Obesity Class 1- BMI 31.39 Encouraged weight reduction.     Out of bed to chair. Incentive spirometry. Nursing supportive care. Fall, aspiration precautions. Diet:  Diet Orders (From admission, onward)     Start     Ordered   04/08/24 1449  DIET DYS 3 Room service appropriate? Yes; Fluid consistency: Thin; Fluid restriction: 1500 mL Fluid  Diet effective now       Question Answer Comment  Room service appropriate? Yes   Fluid consistency: Thin   Fluid restriction: 1500 mL Fluid      04/08/24 1448           DVT prophylaxis: enoxaparin  (LOVENOX ) injection 40 mg Start: 12/11/23 2200  Level of care: Med-Surg   Code Status: Full Code  Subjective: No significant events overnight.  Patient was resting comfortably in the bed, denied any complaints.     Physical Exam: Vitals:   04/22/24 0352 04/22/24 0442 04/22/24 0818 04/22/24 1543  BP: 103/65  107/78 117/82  Pulse: 96  85 98  Resp: 18  18 20   Temp: 98.3 F (36.8 C)  98.1 F (36.7 C) 98 F (36.7 C)  TempSrc: Oral  Oral Oral  SpO2: 100%  98% 99%  Weight:  92.6 kg    Height:        General - Middle aged obese African American female, no apparent distress HEENT - PERRLA, EOMI, atraumatic head, non tender sinuses. Lung - Clear, no rales, rhonchi, wheezes. Heart - S1, S2 heard, no murmurs, rubs, trace pedal edema. Abdomen - Soft, non tender, obese, bowel sounds good Neuro -  Alert, awake and oriented x 3, non focal exam. Skin - Warm and dry.  Data Reviewed:      Latest Ref Rng & Units 04/17/2024    6:14 AM 04/09/2024    5:47 AM 04/03/2024    4:40 AM  CBC  WBC 3.4 - 10.8 x10E3/uL 4.3  4.5  4.4   Hemoglobin 11.1 - 15.9 g/dL 65.7  84.6  9.5   Hematocrit 34.0 - 46.6 % 32.0  30.3  30.1   Platelets 150 - 450 x10E3/uL 325  315  295       Latest Ref Rng &  Units 04/13/2024    6:24 AM 04/12/2024    5:17 AM 04/11/2024    5:39 AM  BMP  Glucose 70 - 99 mg/dL 962  952  841   BUN 6 - 20 mg/dL 15  19  20    Creatinine 0.44 - 1.00 mg/dL 3.24  4.01  0.27   Sodium 135 - 145 mmol/L 137  138  136   Potassium 3.5 - 5.1 mmol/L 3.6  3.5  4.0   Chloride 98 - 111 mmol/L 103  101  100   CO2 22 - 32 mmol/L 25  26  27    Calcium  8.9 - 10.3 mg/dL 9.1  9.3  9.7    No results found.  Disposition: Status is: Inpatient Remains inpatient appropriate because: awaiting guardianship, placement  Planned Discharge Destination: Skilled nursing facility     Time spent: 25 minutes  Author: Althia Atlas, MD 04/22/2024 5:08 PM Secure chat 7am to 7pm For on call review [email or web address].

## 2024-04-23 DIAGNOSIS — F02818 Dementia in other diseases classified elsewhere, unspecified severity, with other behavioral disturbance: Secondary | ICD-10-CM | POA: Diagnosis not present

## 2024-04-23 DIAGNOSIS — B20 Human immunodeficiency virus [HIV] disease: Secondary | ICD-10-CM | POA: Diagnosis not present

## 2024-04-23 NOTE — Progress Notes (Signed)
 Per court docket, patient's incompetency hearing is scheduled for June 24th at 1000. TOC made aware.

## 2024-04-23 NOTE — TOC Progression Note (Signed)
 Transition of Care [facility]) - Progression Note    Patient Details  Name: Meghan Jarvis MRN: [id number] Date of Birth: 06-25-1976  [facility]) CM/SW Contact  Charlestine Rookstool A Nico Syme, RN Phone Number: 04/23/2024, 12:53 PM  Clinical Narrative:    Chart reviewed.  Tiffanie, Nurse Navigator made me aware that Court date for incompetency has been set for June 24 th 2025 at 10 am.    TOC will continue to follow for discharge planning.          Expected Discharge Plan and Services         Expected Discharge Date: 01/10/24                                     Social Determinants of Health (SDOH) Interventions SDOH Screenings   Food Insecurity: Patient Unable To Answer (12/09/2023)  Recent Concern: Food Insecurity - Food Insecurity Present (09/26/2023)  Housing: Patient Unable To Answer (12/09/2023)  Recent Concern: Housing - Medium Risk (09/26/2023)  Transportation Needs: Patient Unable To Answer (12/09/2023)  Recent Concern: Transportation Needs - Unmet Transportation Needs (10/11/2023)  Utilities: Patient Unable To Answer (12/09/2023)  Recent Concern: Utilities - At Risk (09/25/2023)  Tobacco Use: High Risk (12/08/2023)    Readmission Risk Interventions     No data to display

## 2024-04-23 NOTE — Progress Notes (Signed)
 Progress Note   Patient: Meghan Jarvis:[id number] DOB: Mar 12, 1976 DOA: 12/08/2023     137 DOS: the patient was seen and examined on 04/23/2024   Brief hospital course: HPI/Hospital Course: 47yo with h/o polysubstance (ETOH, cocaine) abuse, afib, homelessness, AIDS, hypothyroidism, and DM who presented on 12/08/23 with acute respiratory failure.  She was found down by a friend and could not wake her up.  She was found to be hypoxic in the 70s. Admitted to ICU, intubated, started on antibiotics for pneumonia.  12/31: extubated. ID consulted.  MRI brain with advanced cerebral atrophy.  Psych evaluated - "Patient is lacking and reasoning in her medical care for example she is unable to describe the consequence that she will have if she does not take her medications or has complications with her chronic conditions" and needs assistance with housing and finances due to cognitive decline.  APS/DSS pending guardianship and will need placement.  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 On Mepron  for PCP prophylaxis.  Encounter for assessment of decision-making capacity She lacks capacity to make medical decisions. TOC still attempting to pursue legal guardianship with the court system.  AIDS (acquired immune deficiency syndrome) (HCC) On Biktarvy . On Mepron  for PCP prophylaxis.  Lab Results  Component Value Date/Time   HIV1RNAQUANT 60 03/18/2024 05:03 AM   Last CD4 count of 03-18-2024 was 416  Cocaine abuse (HCC) Advised to quit using illicit drugs.  AKI (acute kidney injury) (HCC)-resolved.  Right knee/ leg pain- continue pain control with lidocaine , tylenol .  Abdominal pain-resolved   Hypotension-resolved  Off midodrine  therapy  Diarrhea- resolved as of 03/22/2024 Likely overflow constipation causing her diarrhea.  Patient did not like  lactulose  or MiraLAX .  Continue Colace.  Generalized weakness-resolved as of 03/22/2024 Physical therapy recommending rehab  Pancytopenia (HCC)- resolved.  Obesity Class 1- BMI 31.39 Encouraged weight reduction.     Out of bed to chair. Incentive spirometry. Nursing supportive care. Fall, aspiration precautions. Diet:  Diet Orders (From admission, onward)     Start     Ordered   04/08/24 1449  DIET DYS 3 Room service appropriate? Yes; Fluid consistency: Thin; Fluid restriction: 1500 mL Fluid  Diet effective now       Question Answer Comment  Room service appropriate? Yes   Fluid consistency: Thin   Fluid restriction: 1500 mL Fluid      04/08/24 1448           DVT prophylaxis: enoxaparin  (LOVENOX ) injection 40 mg Start: 12/11/23 2200  Level of care: Med-Surg   Code Status: Full Code  Subjective: No significant events overnight.  Patient was resting comfortably on the bed, her significant other was sitting on the recliner. Patient denied any complaints, she seemed happy.   Physical Exam: Vitals:   04/23/24 0349 04/23/24 0350 04/23/24 0828 04/23/24 1449  BP: 102/71  (!) 130/93 (!) 115/104  Pulse: 89  93 100  Resp: 18  16 16   Temp: 98.3 F (36.8 C)  98.4 F (36.9 C) 98.9 F (37.2 C)  TempSrc:    Oral  SpO2: 100%  100% 100%  Weight:  93.3 kg    Height:        General - Middle aged obese African American female, no apparent distress HEENT - PERRLA, EOMI, atraumatic head, non tender sinuses. Lung - Clear, no rales, rhonchi, wheezes. Heart - S1, S2 heard, no murmurs, rubs, trace  pedal edema. Abdomen - Soft, non tender, obese, bowel sounds good Neuro - Alert, awake and oriented x 3, non focal exam. Skin - Warm and dry.  Data Reviewed:      Latest Ref Rng & Units 04/17/2024    6:14 AM 04/09/2024    5:47 AM 04/03/2024    4:40 AM  CBC  WBC 3.4 - 10.8 x10E3/uL 4.3  4.5  4.4   Hemoglobin 11.1 - 15.9 g/dL 28.4  13.2  9.5   Hematocrit 34.0 - 46.6 % 32.0  30.3   30.1   Platelets 150 - 450 x10E3/uL 325  315  295       Latest Ref Rng & Units 04/13/2024    6:24 AM 04/12/2024    5:17 AM 04/11/2024    5:39 AM  BMP  Glucose 70 - 99 mg/dL 440  102  725   BUN 6 - 20 mg/dL 15  19  20    Creatinine 0.44 - 1.00 mg/dL 3.66  4.40  3.47   Sodium 135 - 145 mmol/L 137  138  136   Potassium 3.5 - 5.1 mmol/L 3.6  3.5  4.0   Chloride 98 - 111 mmol/L 103  101  100   CO2 22 - 32 mmol/L 25  26  27    Calcium  8.9 - 10.3 mg/dL 9.1  9.3  9.7    No results found.  Disposition: Status is: Inpatient Remains inpatient appropriate because: awaiting guardianship, placement  Planned Discharge Destination: Skilled nursing facility     Time spent: 25 minutes  Author: Althia Atlas, MD 04/23/2024 4:18 PM Secure chat 7am to 7pm For on call review [email or web address].

## 2024-04-23 NOTE — Plan of Care (Signed)
  Problem: Coping: Goal: Ability to adjust to condition or change in health will improve Outcome: Progressing   Problem: Fluid Volume: Goal: Ability to maintain a balanced intake and output will improve Outcome: Progressing   Problem: Health Behavior/Discharge Planning: Goal: Ability to identify and utilize available resources and services will improve Outcome: Progressing Goal: Ability to manage health-related needs will improve Outcome: Progressing   Problem: Metabolic: Goal: Ability to maintain appropriate glucose levels will improve Outcome: Progressing   Problem: Nutritional: Goal: Maintenance of adequate nutrition will improve Outcome: Progressing Goal: Progress toward achieving an optimal weight will improve Outcome: Progressing   Problem: Skin Integrity: Goal: Risk for impaired skin integrity will decrease Outcome: Progressing   Problem: Tissue Perfusion: Goal: Adequacy of tissue perfusion will improve Outcome: Progressing   Problem: Education: Goal: Knowledge of General Education information will improve Description: Including pain rating scale, medication(s)/side effects and non-pharmacologic comfort measures Outcome: Progressing   Problem: Health Behavior/Discharge Planning: Goal: Ability to manage health-related needs will improve Outcome: Progressing   Problem: Clinical Measurements: Goal: Ability to maintain clinical measurements within normal limits will improve Outcome: Progressing Goal: Will remain free from infection Outcome: Progressing Goal: Diagnostic test results will improve Outcome: Progressing Goal: Respiratory complications will improve Outcome: Progressing Goal: Cardiovascular complication will be avoided Outcome: Progressing   Problem: Activity: Goal: Risk for activity intolerance will decrease Outcome: Progressing   Problem: Nutrition: Goal: Adequate nutrition will be maintained Outcome: Progressing   Problem: Coping: Goal:  Level of anxiety will decrease Outcome: Progressing   Problem: Elimination: Goal: Will not experience complications related to bowel motility Outcome: Progressing Goal: Will not experience complications related to urinary retention Outcome: Progressing   Problem: Pain Management: Goal: General experience of comfort will improve Outcome: Progressing   Problem: Safety: Goal: Ability to remain free from injury will improve Outcome: Progressing   Problem: Skin Integrity: Goal: Risk for impaired skin integrity will decrease Outcome: Progressing   Problem: Activity: Goal: Ability to tolerate increased activity will improve Outcome: Progressing   Problem: Respiratory: Goal: Ability to maintain a clear airway and adequate ventilation will improve Outcome: Progressing

## 2024-04-24 DIAGNOSIS — F02818 Dementia in other diseases classified elsewhere, unspecified severity, with other behavioral disturbance: Secondary | ICD-10-CM | POA: Diagnosis not present

## 2024-04-24 DIAGNOSIS — B20 Human immunodeficiency virus [HIV] disease: Secondary | ICD-10-CM | POA: Diagnosis not present

## 2024-04-24 MED ORDER — DICLOFENAC SODIUM 1 % EX GEL
2.0000 g | Freq: Three times a day (TID) | CUTANEOUS | Status: DC
  Administered 2024-04-24 (×2): 2 g via TOPICAL
  Filled 2024-04-24: qty 100

## 2024-04-24 MED ORDER — PREDNISONE 20 MG PO TABS
40.0000 mg | ORAL_TABLET | Freq: Every day | ORAL | Status: AC
  Administered 2024-04-24 – 2024-04-26 (×3): 40 mg via ORAL
  Filled 2024-04-24 (×3): qty 2

## 2024-04-24 NOTE — Progress Notes (Signed)
 Progress Note   Patient: Meghan Jarvis OZH:[id number] DOB: [birth year] DOA: 12/08/2023     138 DOS: the patient was seen and examined on 04/24/2024   Brief hospital course: HPI/Hospital Course: 47yo with h/o polysubstance (ETOH, cocaine) abuse, afib, homelessness, AIDS, hypothyroidism, and DM who presented on 12/08/23 with acute respiratory failure.  She was found down by a friend and could not wake her up.  She was found to be hypoxic in the 70s. Admitted to ICU, intubated, started on antibiotics for pneumonia.  12/31: extubated. ID consulted.  MRI brain with advanced cerebral atrophy.  Psych evaluated - "Patient is lacking and reasoning in her medical care for example she is unable to describe the consequence that she will have if she does not take her medications or has complications with her chronic conditions" and needs assistance with housing and finances due to cognitive decline.  APS/DSS pending guardianship and will need placement.  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 On Mepron  for PCP prophylaxis.  Encounter for assessment of decision-making capacity She lacks capacity to make medical decisions. TOC still attempting to pursue legal guardianship with the court system.  AIDS (acquired immune deficiency syndrome) (HCC) On Biktarvy . On Mepron  for PCP prophylaxis.  Lab Results  Component Value Date/Time   HIV1RNAQUANT 60 03/18/2024 05:03 AM   Last CD4 count of 03-18-2024 was 416  Cocaine abuse (HCC) Advised to quit using illicit drugs.  AKI (acute kidney injury) (HCC)-resolved.  Right knee/ leg pain- continue pain control with lidocaine , tylenol .  Abdominal pain-resolved   Hypotension-resolved  Off midodrine  therapy  Diarrhea- resolved as of 03/22/2024 Likely overflow constipation causing her diarrhea.  Patient did not like  lactulose  or MiraLAX .  Continue Colace.  Generalized weakness-resolved as of 03/22/2024 Physical therapy recommending rehab  Pancytopenia (HCC)- resolved.  C/o Right lateral thigh pain and tenderness, possible IT band syndrome 5/15 Started prednisone  40 mg p.o. daily for 3 days and Voltaren gel 3 times daily.   Obesity Class 1- BMI 31.39 Encouraged weight reduction.     Out of bed to chair. Incentive spirometry. Nursing supportive care. Fall, aspiration precautions. Diet:  Diet Orders (From admission, onward)     Start     Ordered   04/08/24 1449  DIET DYS 3 Room service appropriate? Yes; Fluid consistency: Thin; Fluid restriction: 1500 mL Fluid  Diet effective now       Question Answer Comment  Room service appropriate? Yes   Fluid consistency: Thin   Fluid restriction: 1500 mL Fluid      04/08/24 1448           DVT prophylaxis: enoxaparin  (LOVENOX ) injection 40 mg Start: 12/11/23 2200  Level of care: Med-Surg   Code Status: Full Code  Subjective: No significant events overnight.  Patient was complaining of right lateral thigh tenderness and feeling hot.  Denies any other complaints. Possible IT band syndrome, started prednisone  oral and Voltaren gel. We will continue to monitor.  Patient denied any other complaints.    Physical Exam: Vitals:   04/23/24 1449 04/23/24 2104 04/24/24 0421 04/24/24 0906  BP: (!) 115/104 114/83 117/82 116/74  Pulse: 100 94 92 96  Resp: 16 20 20 18   Temp: 98.9 F (37.2 C) 98.2 F (36.8 C) 98.6 F (37 C) 98.4 F (36.9 C)  TempSrc: Oral Oral  Oral  SpO2: 100% 99% 98% 99%  Weight:   93.2  kg   Height:        General - Middle aged obese African American female, no apparent distress HEENT - PERRLA, EOMI, atraumatic head, non tender sinuses. Lung - Clear, no rales, rhonchi, wheezes. Heart - S1, S2 heard, no murmurs, rubs, trace pedal edema. Abdomen - Soft, non tender, obese, bowel sounds good Neuro - Alert, awake and oriented  x 3, non focal exam. Skin - Warm and dry.  Data Reviewed:      Latest Ref Rng & Units 04/17/2024    6:14 AM 04/09/2024    5:47 AM 04/03/2024    4:40 AM  CBC  WBC 3.4 - 10.8 x10E3/uL 4.3  4.5  4.4   Hemoglobin 11.1 - 15.9 g/dL 62.1  30.8  9.5   Hematocrit 34.0 - 46.6 % 32.0  30.3  30.1   Platelets 150 - 450 x10E3/uL 325  315  295       Latest Ref Rng & Units 04/13/2024    6:24 AM 04/12/2024    5:17 AM 04/11/2024    5:39 AM  BMP  Glucose 70 - 99 mg/dL 657  846  962   BUN 6 - 20 mg/dL 15  19  20    Creatinine 0.44 - 1.00 mg/dL 9.52  8.41  3.24   Sodium 135 - 145 mmol/L 137  138  136   Potassium 3.5 - 5.1 mmol/L 3.6  3.5  4.0   Chloride 98 - 111 mmol/L 103  101  100   CO2 22 - 32 mmol/L 25  26  27    Calcium  8.9 - 10.3 mg/dL 9.1  9.3  9.7    No results found.  Disposition: Status is: Inpatient Remains inpatient appropriate because: awaiting guardianship, placement  Planned Discharge Destination: Skilled nursing facility     Time spent: 25 minutes  Author: Althia Atlas, MD 04/24/2024 2:14 PM Secure chat 7am to 7pm For on call review [email or web address].

## 2024-04-24 NOTE — TOC Progression Note (Signed)
 Transition of Care [facility]) - Progression Note    Patient Details  Name: Meghan Jarvis MRN: [id number] Date of Birth: December 18, 1975  [facility] CM/SW Contact  Odilia Bennett, LCSW Phone Number: 04/24/2024, 10:04 AM  Clinical Narrative:   CSW left message for local group home owner to see when he can come reassess patient.  Expected Discharge Plan and Services         Expected Discharge Date: 01/10/24                                     Social Determinants of Health (SDOH) Interventions SDOH Screenings   Food Insecurity: Patient Unable To Answer (12/09/2023)  Recent Concern: Food Insecurity - Food Insecurity Present (09/26/2023)  Housing: Patient Unable To Answer (12/09/2023)  Recent Concern: Housing - Medium Risk (09/26/2023)  Transportation Needs: Patient Unable To Answer (12/09/2023)  Recent Concern: Transportation Needs - Unmet Transportation Needs (10/11/2023)  Utilities: Patient Unable To Answer (12/09/2023)  Recent Concern: Utilities - At Risk (09/25/2023)  Tobacco Use: High Risk (12/08/2023)    Readmission Risk Interventions     No data to display

## 2024-04-25 ENCOUNTER — Inpatient Hospital Stay: Payer: MEDICAID

## 2024-04-25 DIAGNOSIS — B20 Human immunodeficiency virus [HIV] disease: Secondary | ICD-10-CM | POA: Diagnosis not present

## 2024-04-25 DIAGNOSIS — F02818 Dementia in other diseases classified elsewhere, unspecified severity, with other behavioral disturbance: Secondary | ICD-10-CM | POA: Diagnosis not present

## 2024-04-25 MED ORDER — POTASSIUM CHLORIDE CRYS ER 20 MEQ PO TBCR
20.0000 meq | EXTENDED_RELEASE_TABLET | Freq: Every day | ORAL | Status: DC
  Administered 2024-04-25 – 2024-04-28 (×4): 20 meq via ORAL
  Filled 2024-04-25 (×5): qty 1

## 2024-04-25 MED ORDER — DICLOFENAC SODIUM 1 % EX GEL
2.0000 g | Freq: Three times a day (TID) | CUTANEOUS | Status: DC | PRN
  Administered 2024-04-25 – 2024-05-01 (×6): 2 g via TOPICAL
  Filled 2024-04-25: qty 100

## 2024-04-25 MED ORDER — TORSEMIDE 20 MG PO TABS
40.0000 mg | ORAL_TABLET | Freq: Once | ORAL | Status: AC
  Administered 2024-04-25: 40 mg via ORAL
  Filled 2024-04-25: qty 2

## 2024-04-25 MED ORDER — TORSEMIDE 20 MG PO TABS
20.0000 mg | ORAL_TABLET | Freq: Every day | ORAL | Status: DC
  Administered 2024-04-26: 20 mg via ORAL
  Filled 2024-04-25: qty 1

## 2024-04-25 NOTE — Progress Notes (Signed)
 Progress Note   Patient: Meghan Jarvis JXB:[id number] DOB: [birth year] DOA: 12/08/2023     139 DOS: the patient was seen and examined on 04/25/2024   Brief hospital course: HPI/Hospital Course: 47yo with h/o polysubstance (ETOH, cocaine) abuse, afib, homelessness, AIDS, hypothyroidism, and DM who presented on 12/08/23 with acute respiratory failure.  She was found down by a friend and could not wake her up.  She was found to be hypoxic in the 70s. Admitted to ICU, intubated, started on antibiotics for pneumonia.  12/31: extubated. ID consulted.  MRI brain with advanced cerebral atrophy.  Psych evaluated - "Patient is lacking and reasoning in her medical care for example she is unable to describe the consequence that she will have if she does not take her medications or has complications with her chronic conditions" and needs assistance with housing and finances due to cognitive decline.  APS/DSS pending guardianship and will need placement.  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 On Mepron  for PCP prophylaxis.  Encounter for assessment of decision-making capacity She lacks capacity to make medical decisions. TOC still attempting to pursue legal guardianship with the court system.  AIDS (acquired immune deficiency syndrome) (HCC) On Biktarvy . On Mepron  for PCP prophylaxis.  Lab Results  Component Value Date/Time   HIV1RNAQUANT 60 03/18/2024 05:03 AM   Last CD4 count of 03-18-2024 was 416  Cocaine abuse (HCC) Advised to quit using illicit drugs.  AKI (acute kidney injury) (HCC)-resolved.  Right knee/ leg pain- continue pain control with lidocaine , tylenol .  Abdominal pain-resolved   Hypotension-resolved  Off midodrine  therapy  Diarrhea- resolved as of 03/22/2024 Likely overflow constipation causing her diarrhea.  Patient did not like  lactulose  or MiraLAX .  Continue Colace.  Generalized weakness-resolved as of 03/22/2024 Physical therapy recommending rehab  Pancytopenia (HCC)- resolved.  C/o Right lateral thigh pain and tenderness, possible IT band syndrome 5/15 Started prednisone  40 mg p.o. daily for 3 days and Voltaren gel 3 times daily. 5/16 RLE edema, started torsemide  40 mg x 1 dose followed by 20 mg p.o. daily.  K-Dur 20 mEq p.o. daily Check electrolytes tomorrow a.m. and assess for volume status US  venous duplex: Negative for right lower extremity DVT.    Obesity Class 1- BMI 31.39 Encouraged weight reduction.     Out of bed to chair. Incentive spirometry. Nursing supportive care. Fall, aspiration precautions. Diet:  Diet Orders (From admission, onward)     Start     Ordered   04/08/24 1449  DIET DYS 3 Room service appropriate? Yes; Fluid consistency: Thin; Fluid restriction: 1500 mL Fluid  Diet effective now       Question Answer Comment  Room service appropriate? Yes   Fluid consistency: Thin   Fluid restriction: 1500 mL Fluid      04/08/24 1448           DVT prophylaxis: enoxaparin  (LOVENOX ) injection 40 mg Start: 12/11/23 2200  Level of care: Med-Surg   Code Status: Full Code  Subjective: No significant events overnight.  Patient still complaining of right thigh pain, feels better after Voltaren gel.  Pain has not completely resolved.  She is again complaining of edema in the right lower extremity.   Physical Exam: Vitals:   04/24/24 2003 04/25/24 0326 04/25/24 0400 04/25/24 0915  BP: 131/85 123/89  125/86  Pulse: 95 (!) 104  (!) 41  Resp: 20 16  16  Temp: 97.9 F (36.6 C) 98.3 F (36.8 C)  98.6 F (37 C)  TempSrc: Oral Oral  Oral  SpO2: 98% 98%  91%  Weight:   96.9 kg   Height:        General - Middle aged obese African American female, no apparent distress HEENT - PERRLA, EOMI, atraumatic head, non tender sinuses. Lung - Clear, no rales, rhonchi, wheezes. Heart - S1,  S2 heard, no murmurs, rubs, trace pedal edema. Abdomen - Soft, non tender, obese, bowel sounds good Neuro - Alert, awake and oriented x 3, non focal exam. Extremities: Right lateral thigh tenderness and 2-3+ pedal edema  Data Reviewed:      Latest Ref Rng & Units 04/17/2024    6:14 AM 04/09/2024    5:47 AM 04/03/2024    4:40 AM  CBC  WBC 3.4 - 10.8 x10E3/uL 4.3  4.5  4.4   Hemoglobin 11.1 - 15.9 g/dL 95.6  21.3  9.5   Hematocrit 34.0 - 46.6 % 32.0  30.3  30.1   Platelets 150 - 450 x10E3/uL 325  315  295       Latest Ref Rng & Units 04/13/2024    6:24 AM 04/12/2024    5:17 AM 04/11/2024    5:39 AM  BMP  Glucose 70 - 99 mg/dL 086  578  469   BUN 6 - 20 mg/dL 15  19  20    Creatinine 0.44 - 1.00 mg/dL 6.29  5.28  4.13   Sodium 135 - 145 mmol/L 137  138  136   Potassium 3.5 - 5.1 mmol/L 3.6  3.5  4.0   Chloride 98 - 111 mmol/L 103  101  100   CO2 22 - 32 mmol/L 25  26  27    Calcium  8.9 - 10.3 mg/dL 9.1  9.3  9.7    US  Venous Img Lower Unilateral Right (DVT) Result Date: 04/25/2024 CLINICAL DATA:  Right leg swelling for 1 week.  Right leg pain. EXAM: Right LOWER EXTREMITY VENOUS DOPPLER ULTRASOUND TECHNIQUE: Gray-scale sonography with compression, as well as color and duplex ultrasound, were performed to evaluate the deep venous system(s) from the level of the common femoral vein through the popliteal and proximal calf veins. COMPARISON:  None Available. FINDINGS: VENOUS Normal compressibility of the common femoral, superficial femoral, and popliteal veins, as well as the visualized calf veins. Visualized portions of profunda femoral vein and great saphenous vein unremarkable. No filling defects to suggest DVT on grayscale or color Doppler imaging. Doppler waveforms show normal direction of venous flow, normal respiratory plasticity and response to augmentation. Limited views of the contralateral common femoral vein are unremarkable. IMPRESSION: Negative for right lower extremity DVT.  Electronically Signed   By: Susan Ensign   On: 04/25/2024 15:48    Disposition: Status is: Inpatient Remains inpatient appropriate because: awaiting guardianship, placement  Planned Discharge Destination: Skilled nursing facility     Time spent: 35 minutes  Author: Althia Atlas, MD 04/25/2024 4:46 PM Secure chat 7am to 7pm For on call review [email or web address].

## 2024-04-25 NOTE — Plan of Care (Signed)
  Problem: Coping: Goal: Ability to adjust to condition or change in health will improve Outcome: Progressing   Problem: Fluid Volume: Goal: Ability to maintain a balanced intake and output will improve Outcome: Progressing   Problem: Health Behavior/Discharge Planning: Goal: Ability to identify and utilize available resources and services will improve Outcome: Progressing Goal: Ability to manage health-related needs will improve Outcome: Progressing

## 2024-04-26 DIAGNOSIS — B20 Human immunodeficiency virus [HIV] disease: Secondary | ICD-10-CM | POA: Diagnosis not present

## 2024-04-26 DIAGNOSIS — F02818 Dementia in other diseases classified elsewhere, unspecified severity, with other behavioral disturbance: Secondary | ICD-10-CM | POA: Diagnosis not present

## 2024-04-26 LAB — BASIC METABOLIC PANEL WITH GFR
Anion gap: 10 (ref 5–15)
BUN: 15 mg/dL (ref 6–20)
CO2: 25 mmol/L (ref 22–32)
Calcium: 9 mg/dL (ref 8.9–10.3)
Chloride: 108 mmol/L (ref 98–111)
Creatinine, Ser: 0.78 mg/dL (ref 0.44–1.00)
GFR, Estimated: >60 mL/min (ref >60)
Glucose, Bld: 81 mg/dL (ref 70–99)
Potassium: 3.7 mmol/L (ref 3.5–5.1)
Sodium: 143 mmol/L (ref 135–145)

## 2024-04-26 LAB — CBC
HCT: 28.5 % — ABNORMAL LOW (ref 36.0–46.0)
Hemoglobin: 9.3 g/dL — ABNORMAL LOW (ref 12.0–15.0)
MCH: 26.4 pg (ref 26.0–34.0)
MCHC: 32.6 g/dL (ref 30.0–36.0)
MCV: 81 fL (ref 80.0–100.0)
Platelets: 290 10*3/uL (ref 150–400)
RBC: 3.52 MIL/uL — ABNORMAL LOW (ref 3.87–5.11)
RDW: 14.5 % (ref 11.5–15.5)
WBC: 7.8 10*3/uL (ref 4.0–10.5)
nRBC: 0 % (ref 0.0–0.2)

## 2024-04-26 LAB — MAGNESIUM: Magnesium: 1.7 mg/dL (ref 1.7–2.4)

## 2024-04-26 LAB — PHOSPHORUS: Phosphorus: 3.8 mg/dL (ref 2.5–4.6)

## 2024-04-26 MED ORDER — TORSEMIDE 20 MG PO TABS
40.0000 mg | ORAL_TABLET | Freq: Every day | ORAL | Status: DC
  Administered 2024-04-27: 40 mg via ORAL
  Filled 2024-04-26: qty 2

## 2024-04-26 MED ORDER — TORSEMIDE 20 MG PO TABS
20.0000 mg | ORAL_TABLET | Freq: Once | ORAL | Status: AC
  Administered 2024-04-26: 20 mg via ORAL
  Filled 2024-04-26: qty 1

## 2024-04-26 NOTE — TOC Progression Note (Signed)
 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 Per chart  review & handoff pt in the process of guardianship due to medical diagnosis. This CM rec'd message from group home owner Marcello Sero that he will tentatively complete bedside assessment with pt on Mon 05/19 regarding possible dc to his facility. TOC will cont to follow dc planning / care coordinating and update as applicable.         Expected Discharge Plan and Services         Expected Discharge Date: 01/10/24                                     Social Determinants of Health (SDOH) Interventions SDOH Screenings   Food Insecurity: Patient Unable To Answer (12/09/2023)  Recent Concern: Food Insecurity - Food Insecurity Present (09/26/2023)  Housing: Patient Unable To Answer (12/09/2023)  Recent Concern: Housing - Medium Risk (09/26/2023)  Transportation Needs: Patient Unable To Answer (12/09/2023)  Recent Concern: Transportation Needs - Unmet Transportation Needs (10/11/2023)  Utilities: Patient Unable To Answer (12/09/2023)  Recent Concern: Utilities - At Risk (09/25/2023)  Tobacco Use: High Risk (12/08/2023)    Readmission Risk Interventions     No data to display

## 2024-04-26 NOTE — Progress Notes (Signed)
 Progress Note   Patient: Meghan Jarvis HYQ:[id number] DOB: 13-Nov-1976 DOA: 12/08/2023     140 DOS: the patient was seen and examined on 04/26/2024   Brief hospital course: HPI/Hospital Course: 47yo with h/o polysubstance (ETOH, cocaine) abuse, afib, homelessness, AIDS, hypothyroidism, and DM who presented on 12/08/23 with acute respiratory failure.  She was found down by a friend and could not wake her up.  She was found to be hypoxic in the 70s. Admitted to ICU, intubated, started on antibiotics for pneumonia.  12/31: extubated. ID consulted.  MRI brain with advanced cerebral atrophy.  Psych evaluated - "Patient is lacking and reasoning in her medical care for example she is unable to describe the consequence that she will have if she does not take her medications or has complications with her chronic conditions" and needs assistance with housing and finances due to cognitive decline.  APS/DSS pending guardianship and will need placement.  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 On Mepron  for PCP prophylaxis.  Encounter for assessment of decision-making capacity She lacks capacity to make medical decisions. TOC still attempting to pursue legal guardianship with the court system.  AIDS (acquired immune deficiency syndrome) (HCC) On Biktarvy . On Mepron  for PCP prophylaxis.  Lab Results  Component Value Date/Time   HIV1RNAQUANT 60 03/18/2024 05:03 AM   Last CD4 count of 03-18-2024 was 416  Cocaine abuse (HCC) Advised to quit using illicit drugs.  AKI (acute kidney injury) (HCC)-resolved.  Right knee/ leg pain- continue pain control with lidocaine , tylenol .  Abdominal pain-resolved   Hypotension-resolved  Off midodrine  therapy  Diarrhea- resolved as of 03/22/2024 Likely overflow constipation causing her diarrhea.  Patient did not like  lactulose  or MiraLAX .  Continue Colace.  Generalized weakness-resolved as of 03/22/2024 Physical therapy recommending rehab  Pancytopenia (HCC)- resolved.  C/o Right lateral thigh pain and tenderness, possible IT band syndrome 5/15 Started prednisone  40 mg p.o. daily for 3 days and Voltaren  gel 3 times daily. 5/16 RLE edema, started torsemide  40 mg po daily x 5 days and  K-Dur 20 mEq p.o. daily US  venous duplex: Negative for right lower extremity DVT.  Check electrolytes tomorrow a.m. and assess for volume status  Obesity Class 1- BMI 31.39 Encouraged weight reduction.     Out of bed to chair. Incentive spirometry. Nursing supportive care. Fall, aspiration precautions. Diet:  Diet Orders (From admission, onward)     Start     Ordered   04/08/24 1449  DIET DYS 3 Room service appropriate? Yes; Fluid consistency: Thin; Fluid restriction: 1500 mL Fluid  Diet effective now       Question Answer Comment  Room service appropriate? Yes   Fluid consistency: Thin   Fluid restriction: 1500 mL Fluid      04/08/24 1448           DVT prophylaxis: enoxaparin  (LOVENOX ) injection 40 mg Start: 12/11/23 2200  Level of care: Med-Surg   Code Status: Full Code  Subjective: No significant events overnight.  Patient still complaining of right thigh pain, feels better after Voltaren  gel.  Pain has not completely resolved.  She is again complaining of edema in the right lower extremity.   Physical Exam: Vitals:   04/25/24 2008 04/26/24 0347 04/26/24 0500 04/26/24 0750  BP: 117/89 100/66  130/86  Pulse: 100   (!) 101  Resp: 20 20  16   Temp: 98.2 F (36.8 C)  98.4 F (36.9 C)  98.9 F (37.2 C)  TempSrc: Oral Oral  Oral  SpO2: 99% 99%  99%  Weight:   94.2 kg   Height:        General - Middle aged obese African American female, no apparent distress HEENT - PERRLA, EOMI, atraumatic head, non tender sinuses. Lung - Clear, no rales, rhonchi, wheezes. Heart - S1, S2 heard, no murmurs,  rubs, trace pedal edema. Abdomen - Soft, non tender, obese, bowel sounds good Neuro - Alert, awake and oriented x 3, non focal exam. Extremities: Right lateral thigh tenderness and 2-3+ pedal edema  Data Reviewed:      Latest Ref Rng & Units 04/26/2024    8:04 AM 04/17/2024    6:14 AM 04/09/2024    5:47 AM  CBC  WBC 4.0 - 10.5 K/uL 7.8  4.3  4.5   Hemoglobin 12.0 - 15.0 g/dL 9.3  46.9  62.9   Hematocrit 36.0 - 46.0 % 28.5  32.0  30.3   Platelets 150 - 400 K/uL 290  325  315       Latest Ref Rng & Units 04/26/2024    8:04 AM 04/13/2024    6:24 AM 04/12/2024    5:17 AM  BMP  Glucose 70 - 99 mg/dL 81  528  413   BUN 6 - 20 mg/dL 15  15  19    Creatinine 0.44 - 1.00 mg/dL 2.44  0.10  2.72   Sodium 135 - 145 mmol/L 143  137  138   Potassium 3.5 - 5.1 mmol/L 3.7  3.6  3.5   Chloride 98 - 111 mmol/L 108  103  101   CO2 22 - 32 mmol/L 25  25  26    Calcium  8.9 - 10.3 mg/dL 9.0  9.1  9.3    US  Venous Img Lower Unilateral Right (DVT) Result Date: 04/25/2024 CLINICAL DATA:  Right leg swelling for 1 week.  Right leg pain. EXAM: Right LOWER EXTREMITY VENOUS DOPPLER ULTRASOUND TECHNIQUE: Gray-scale sonography with compression, as well as color and duplex ultrasound, were performed to evaluate the deep venous system(s) from the level of the common femoral vein through the popliteal and proximal calf veins. COMPARISON:  None Available. FINDINGS: VENOUS Normal compressibility of the common femoral, superficial femoral, and popliteal veins, as well as the visualized calf veins. Visualized portions of profunda femoral vein and great saphenous vein unremarkable. No filling defects to suggest DVT on grayscale or color Doppler imaging. Doppler waveforms show normal direction of venous flow, normal respiratory plasticity and response to augmentation. Limited views of the contralateral common femoral vein are unremarkable. IMPRESSION: Negative for right lower extremity DVT. Electronically Signed   By: Susan Ensign   On: 04/25/2024 15:48    Disposition: Status is: Inpatient Remains inpatient appropriate because: awaiting guardianship, placement  Planned Discharge Destination: Skilled nursing facility     Time spent: 35 minutes  Author: Althia Atlas, MD 04/26/2024 2:10 PM Secure chat 7am to 7pm For on call review [email or web address].

## 2024-04-27 ENCOUNTER — Inpatient Hospital Stay: Payer: MEDICAID

## 2024-04-27 DIAGNOSIS — B20 Human immunodeficiency virus [HIV] disease: Secondary | ICD-10-CM | POA: Diagnosis not present

## 2024-04-27 DIAGNOSIS — F02818 Dementia in other diseases classified elsewhere, unspecified severity, with other behavioral disturbance: Secondary | ICD-10-CM | POA: Diagnosis not present

## 2024-04-27 LAB — BASIC METABOLIC PANEL WITH GFR
Anion gap: 8 (ref 5–15)
BUN: 16 mg/dL (ref 6–20)
CO2: 31 mmol/L (ref 22–32)
Calcium: 9.2 mg/dL (ref 8.9–10.3)
Chloride: 100 mmol/L (ref 98–111)
Creatinine, Ser: 0.82 mg/dL (ref 0.44–1.00)
GFR, Estimated: >60 mL/min (ref >60)
Glucose, Bld: 139 mg/dL — ABNORMAL HIGH (ref 70–99)
Potassium: 3 mmol/L — ABNORMAL LOW (ref 3.5–5.1)
Sodium: 139 mmol/L (ref 135–145)

## 2024-04-27 LAB — MAGNESIUM: Magnesium: 1.7 mg/dL (ref 1.7–2.4)

## 2024-04-27 MED ORDER — POTASSIUM CHLORIDE 20 MEQ PO PACK
40.0000 meq | PACK | ORAL | Status: AC
  Administered 2024-04-27 (×2): 40 meq via ORAL
  Filled 2024-04-27 (×2): qty 2

## 2024-04-27 MED ORDER — GABAPENTIN 300 MG PO CAPS
300.0000 mg | ORAL_CAPSULE | Freq: Every day | ORAL | Status: DC
  Administered 2024-04-27 – 2024-05-06 (×10): 300 mg via ORAL
  Filled 2024-04-27 (×10): qty 1

## 2024-04-27 MED ORDER — TORSEMIDE 20 MG PO TABS
20.0000 mg | ORAL_TABLET | Freq: Every day | ORAL | Status: DC
  Administered 2024-04-28: 20 mg via ORAL
  Filled 2024-04-27: qty 1

## 2024-04-27 NOTE — Progress Notes (Signed)
 Progress Note   Patient: Meghan Jarvis ZOX:[id number] DOB: 1976/09/23 DOA: 12/08/2023     141 DOS: the patient was seen and examined on 04/27/2024   Brief hospital course: HPI/Hospital Course: 48yo with h/o polysubstance (ETOH, cocaine) abuse, afib, homelessness, AIDS, hypothyroidism, and DM who presented on 12/08/23 with acute respiratory failure.  She was found down by a friend and could not wake her up.  She was found to be hypoxic in the 70s. Admitted to ICU, intubated, started on antibiotics for pneumonia.  12/31: extubated. ID consulted.  MRI brain with advanced cerebral atrophy.  Psych evaluated - "Patient is lacking and reasoning in her medical care for example she is unable to describe the consequence that she will have if she does not take her medications or has complications with her chronic conditions" and needs assistance with housing and finances due to cognitive decline.  APS/DSS pending guardianship and will need placement.  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 On Mepron  for PCP prophylaxis.   AIDS (acquired immune deficiency syndrome) (HCC) On Biktarvy . On Mepron  for PCP prophylaxis.  Lab Results  Component Value Date/Time   HIV1RNAQUANT 60 03/18/2024 05:03 AM  Last CD4 count of 03-18-2024 was 416  C/o Right lateral thigh pain and tenderness, possible IT band syndrome 5/15 Started prednisone  40 mg p.o. daily for 3 days and Voltaren  gel 3 times daily. 5/16 RLE edema, started torsemide  40 mg po daily x 5 days and  K-Dur 20 mEq p.o. daily US  venous duplex: Negative for right lower extremity DVT.  5/17 possible component of neuropathy.  Patient notes burning sensation and numbness. -Start gabapentin  in addition to other medications  Bilateral lower extremity edema Possibly due to HFpEF exacerbation.  Patient was started on  torsemide  and has minimal R pedal edema.  Given patient's pain in this area an x-ray was obtained of her right ankle which showed no significant ankle pathology.  -Continue trial of diuresis with potassium supplementation.   Cocaine abuse (HCC) Advised to quit using illicit drugs.  AKI (acute kidney injury) (HCC)-resolved.  Right knee/ leg pain- continue pain control with lidocaine , tylenol .  Abdominal pain-resolved   Hypotension-resolved  Off midodrine  therapy  Diarrhea- resolved as of 03/22/2024 Likely overflow constipation causing her diarrhea.  Patient did not like lactulose  or MiraLAX .  Continue Colace.  Generalized weakness-resolved as of 03/22/2024 Physical therapy recommending rehab  Pancytopenia (HCC)- resolved.    Encounter for assessment of decision-making capacity She lacks capacity to make medical decisions. TOC still attempting to pursue legal guardianship with the court system.  Obesity Class 1- BMI 31.39 Encouraged weight reduction.     Out of bed to chair. Incentive spirometry. Nursing supportive care. Fall, aspiration precautions. Diet:  Diet Orders (From admission, onward)     Start     Ordered   04/08/24 1449  DIET DYS 3 Room service appropriate? Yes; Fluid consistency: Thin; Fluid restriction: 1500 mL Fluid  Diet effective now       Question Answer Comment  Room service appropriate? Yes   Fluid consistency: Thin   Fluid restriction: 1500 mL Fluid      04/08/24 1448           DVT prophylaxis: enoxaparin  (LOVENOX ) injection 40 mg Start: 12/11/23 2200  Level of care: Med-Surg   Code Status: Full Code  Subjective: Patient states that she sometimes has a "fire" pain in R  thigh and numbness in this area as well. She also notes numbness in LLE.   Physical Exam: Vitals:   04/26/24 1602 04/26/24 2057 04/27/24 0547 04/27/24 0920  BP: 120/89 (!) 118/92 113/82 120/83  Pulse: 100 (!) 101 85 (!) 108  Resp: 16 16 16 16   Temp: 98.1 F (36.7 C)  98.4 F (36.9 C) 98 F (36.7 C) 98.5 F (36.9 C)  TempSrc:  Oral Oral   SpO2: 99% 97% 97% 98%  Weight:      Height:          Physical Exam  Constitutional: In no distress.  Cardiovascular: Normal rate, regular rhythm. R pedal edema 1+ Pulmonary: Non labored breathing on room air, no wheezing or rales.   Abdominal: Soft. Normal bowel sounds. Non distended and non tender Musculoskeletal: R lateral thigh TT mild palpation  Neurological: Alert and oriented to person, place, and time. Non focal  Skin: Skin is warm and dry.    Data Reviewed:      Latest Ref Rng & Units 04/26/2024    8:04 AM 04/17/2024    6:14 AM 04/09/2024    5:47 AM  CBC  WBC 4.0 - 10.5 K/uL 7.8  4.3  4.5   Hemoglobin 12.0 - 15.0 g/dL 9.3  95.6  21.3   Hematocrit 36.0 - 46.0 % 28.5  32.0  30.3   Platelets 150 - 400 K/uL 290  325  315       Latest Ref Rng & Units 04/27/2024    9:04 AM 04/26/2024    8:04 AM 04/13/2024    6:24 AM  BMP  Glucose 70 - 99 mg/dL 086  81  578   BUN 6 - 20 mg/dL 16  15  15    Creatinine 0.44 - 1.00 mg/dL 4.69  6.29  5.28   Sodium 135 - 145 mmol/L 139  143  137   Potassium 3.5 - 5.1 mmol/L 3.0  3.7  3.6   Chloride 98 - 111 mmol/L 100  108  103   CO2 22 - 32 mmol/L 31  25  25    Calcium  8.9 - 10.3 mg/dL 9.2  9.0  9.1    US  Venous Img Lower Unilateral Right (DVT) Result Date: 04/25/2024 CLINICAL DATA:  Right leg swelling for 1 week.  Right leg pain. EXAM: Right LOWER EXTREMITY VENOUS DOPPLER ULTRASOUND TECHNIQUE: Gray-scale sonography with compression, as well as color and duplex ultrasound, were performed to evaluate the deep venous system(s) from the level of the common femoral vein through the popliteal and proximal calf veins. COMPARISON:  None Available. FINDINGS: VENOUS Normal compressibility of the common femoral, superficial femoral, and popliteal veins, as well as the visualized calf veins. Visualized portions of profunda femoral vein and great saphenous vein unremarkable. No filling  defects to suggest DVT on grayscale or color Doppler imaging. Doppler waveforms show normal direction of venous flow, normal respiratory plasticity and response to augmentation. Limited views of the contralateral common femoral vein are unremarkable. IMPRESSION: Negative for right lower extremity DVT. Electronically Signed   By: Susan Ensign   On: 04/25/2024 15:48    Disposition: Status is: Inpatient Remains inpatient appropriate because: awaiting guardianship, placement  Planned Discharge Destination: Skilled nursing facility     Time spent: 35 minutes  Author: Joette Mustard, MD 04/27/2024 10:28 AM Secure chat 7am to 7pm For on call review [email or web address].

## 2024-04-27 NOTE — Plan of Care (Signed)
  Problem: Coping: Goal: Ability to adjust to condition or change in health will improve Outcome: Progressing   Problem: Fluid Volume: Goal: Ability to maintain a balanced intake and output will improve Outcome: Progressing   Problem: Health Behavior/Discharge Planning: Goal: Ability to identify and utilize available resources and services will improve Outcome: Progressing Goal: Ability to manage health-related needs will improve Outcome: Progressing   Problem: Metabolic: Goal: Ability to maintain appropriate glucose levels will improve Outcome: Progressing   Problem: Nutritional: Goal: Maintenance of adequate nutrition will improve Outcome: Progressing Goal: Progress toward achieving an optimal weight will improve Outcome: Progressing   Problem: Skin Integrity: Goal: Risk for impaired skin integrity will decrease Outcome: Progressing   Problem: Tissue Perfusion: Goal: Adequacy of tissue perfusion will improve Outcome: Progressing   Problem: Education: Goal: Knowledge of General Education information will improve Description: Including pain rating scale, medication(s)/side effects and non-pharmacologic comfort measures Outcome: Progressing   Problem: Health Behavior/Discharge Planning: Goal: Ability to manage health-related needs will improve Outcome: Progressing   Problem: Clinical Measurements: Goal: Ability to maintain clinical measurements within normal limits will improve Outcome: Progressing Goal: Will remain free from infection Outcome: Progressing Goal: Diagnostic test results will improve Outcome: Progressing Goal: Respiratory complications will improve Outcome: Progressing Goal: Cardiovascular complication will be avoided Outcome: Progressing   Problem: Activity: Goal: Risk for activity intolerance will decrease Outcome: Progressing   Problem: Nutrition: Goal: Adequate nutrition will be maintained Outcome: Progressing   Problem: Coping: Goal:  Level of anxiety will decrease Outcome: Progressing   Problem: Elimination: Goal: Will not experience complications related to bowel motility Outcome: Progressing Goal: Will not experience complications related to urinary retention Outcome: Progressing   Problem: Pain Management: Goal: General experience of comfort will improve Outcome: Progressing   Problem: Safety: Goal: Ability to remain free from injury will improve Outcome: Progressing   Problem: Skin Integrity: Goal: Risk for impaired skin integrity will decrease Outcome: Progressing   Problem: Activity: Goal: Ability to tolerate increased activity will improve Outcome: Progressing   Problem: Respiratory: Goal: Ability to maintain a clear airway and adequate ventilation will improve Outcome: Progressing

## 2024-04-27 NOTE — Plan of Care (Signed)
   Problem: Coping: Goal: Ability to adjust to condition or change in health will improve Outcome: Progressing   Problem: Fluid Volume: Goal: Ability to maintain a balanced intake and output will improve Outcome: Progressing   Problem: Health Behavior/Discharge Planning: Goal: Ability to identify and utilize available resources and services will improve Outcome: Progressing

## 2024-04-28 DIAGNOSIS — B20 Human immunodeficiency virus [HIV] disease: Secondary | ICD-10-CM | POA: Diagnosis not present

## 2024-04-28 DIAGNOSIS — F02818 Dementia in other diseases classified elsewhere, unspecified severity, with other behavioral disturbance: Secondary | ICD-10-CM | POA: Diagnosis not present

## 2024-04-28 LAB — CBC
HCT: 33.1 % — ABNORMAL LOW (ref 36.0–46.0)
Hemoglobin: 10.3 g/dL — ABNORMAL LOW (ref 12.0–15.0)
MCH: 25.8 pg — ABNORMAL LOW (ref 26.0–34.0)
MCHC: 31.1 g/dL (ref 30.0–36.0)
MCV: 82.8 fL (ref 80.0–100.0)
Platelets: 309 10*3/uL (ref 150–400)
RBC: 4 MIL/uL (ref 3.87–5.11)
RDW: 14.5 % (ref 11.5–15.5)
WBC: 5.9 10*3/uL (ref 4.0–10.5)
nRBC: 0 % (ref 0.0–0.2)

## 2024-04-28 LAB — BASIC METABOLIC PANEL WITH GFR
Anion gap: 10 (ref 5–15)
BUN: 18 mg/dL (ref 6–20)
CO2: 27 mmol/L (ref 22–32)
Calcium: 9.1 mg/dL (ref 8.9–10.3)
Chloride: 103 mmol/L (ref 98–111)
Creatinine, Ser: 0.9 mg/dL (ref 0.44–1.00)
GFR, Estimated: >60 mL/min (ref >60)
Glucose, Bld: 123 mg/dL — ABNORMAL HIGH (ref 70–99)
Potassium: 3.4 mmol/L — ABNORMAL LOW (ref 3.5–5.1)
Sodium: 140 mmol/L (ref 135–145)

## 2024-04-28 LAB — FERRITIN: Ferritin: 8 ng/mL — ABNORMAL LOW (ref 11–307)

## 2024-04-28 LAB — IRON AND TIBC
Iron: 41 ug/dL (ref 28–170)
Saturation Ratios: 10 % — ABNORMAL LOW (ref 10.4–31.8)
TIBC: 398 ug/dL (ref 250–450)
UIBC: 357 ug/dL

## 2024-04-28 LAB — FOLATE: Folate: >40 ng/mL (ref >5.9)

## 2024-04-28 LAB — VITAMIN B12: Vitamin B-12: 290 pg/mL (ref 180–914)

## 2024-04-28 MED ORDER — VITAMIN C 500 MG PO TABS
500.0000 mg | ORAL_TABLET | Freq: Every day | ORAL | Status: DC
  Administered 2024-04-28 – 2024-05-07 (×10): 500 mg via ORAL
  Filled 2024-04-28 (×10): qty 1

## 2024-04-28 MED ORDER — TORSEMIDE 20 MG PO TABS: 20.0000 mg | ORAL_TABLET | Freq: Every day | ORAL | Status: DC | PRN

## 2024-04-28 MED ORDER — THIAMINE HCL 100 MG PO TABS
100.0000 mg | ORAL_TABLET | Freq: Every day | ORAL | Status: DC
Start: 2024-04-28 — End: 2024-05-07
  Administered 2024-04-29 – 2024-05-07 (×9): 100 mg via ORAL
  Filled 2024-04-28 (×9): qty 1

## 2024-04-28 MED ORDER — POTASSIUM CHLORIDE CRYS ER 20 MEQ PO TBCR
40.0000 meq | EXTENDED_RELEASE_TABLET | Freq: Once | ORAL | Status: AC
  Administered 2024-04-28: 40 meq via ORAL
  Filled 2024-04-28: qty 2

## 2024-04-28 MED ORDER — POLYSACCHARIDE IRON COMPLEX 150 MG PO CAPS
150.0000 mg | ORAL_CAPSULE | Freq: Every day | ORAL | Status: DC
  Administered 2024-04-28 – 2024-05-07 (×10): 150 mg via ORAL
  Filled 2024-04-28 (×11): qty 1

## 2024-04-28 NOTE — Plan of Care (Signed)
  Problem: Coping: Goal: Ability to adjust to condition or change in health will improve Outcome: Progressing   Problem: Fluid Volume: Goal: Ability to maintain a balanced intake and output will improve Outcome: Progressing   Problem: Health Behavior/Discharge Planning: Goal: Ability to identify and utilize available resources and services will improve Outcome: Progressing   Problem: Coping: Goal: Ability to adjust to condition or change in health will improve Outcome: Progressing   Problem: Fluid Volume: Goal: Ability to maintain a balanced intake and output will improve Outcome: Progressing   Problem: Health Behavior/Discharge Planning: Goal: Ability to identify and utilize available resources and services will improve Outcome: Progressing

## 2024-04-28 NOTE — Progress Notes (Signed)
 Meghan Jarvis came by from the group home this evening and said he will take this patient. He said he just needs to figure out which facility he is going to put the patient in and we should be hearing back from him probably Tue 11-May-2024. This message was passed on to Dr Hubert Madden and [facility]

## 2024-04-28 NOTE — TOC Progression Note (Signed)
 Transition of Care [facility]) - Progression Note    Patient Details  Name: Meghan Jarvis MRN: [id number] Date of Birth: [birth year]  [facility] CM/SW Contact  Odilia Bennett, LCSW Phone Number: 04/28/2024, 12:39 PM  Clinical Narrative:   Local group home owner still plans to come assess patient today.  Expected Discharge Plan and Services         Expected Discharge Date: 01/10/24                                     Social Determinants of Health (SDOH) Interventions SDOH Screenings   Food Insecurity: Patient Unable To Answer (12/09/2023)  Recent Concern: Food Insecurity - Food Insecurity Present (09/26/2023)  Housing: Patient Unable To Answer (12/09/2023)  Recent Concern: Housing - Medium Risk (09/26/2023)  Transportation Needs: Patient Unable To Answer (12/09/2023)  Recent Concern: Transportation Needs - Unmet Transportation Needs (10/11/2023)  Utilities: Patient Unable To Answer (12/09/2023)  Recent Concern: Utilities - At Risk (09/25/2023)  Tobacco Use: High Risk (12/08/2023)    Readmission Risk Interventions     No data to display

## 2024-04-28 NOTE — Progress Notes (Addendum)
 Progress Note   Patient: Meghan Jarvis:[id number] DOB: [birth year] DOA: 12/08/2023     142 DOS: the patient was seen and examined on 04/28/2024   Brief hospital course: HPI/Hospital Course: 48yo with h/o polysubstance (ETOH, cocaine) abuse, afib, homelessness, AIDS, hypothyroidism, and DM who presented on 12/08/23 with acute respiratory failure.  She was found down by a friend and could not wake her up.  She was found to be hypoxic in the 70s. Admitted to ICU, intubated, started on antibiotics for pneumonia.  12/31: extubated. ID consulted.  MRI brain with advanced cerebral atrophy.  Psych evaluated - "Patient is lacking and reasoning in her medical care for example she is unable to describe the consequence that she will have if she does not take her medications or has complications with her chronic conditions" and needs assistance with housing and finances due to cognitive decline.  APS/DSS pending guardianship and will need placement.  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 On Mepron  for PCP prophylaxis.   AIDS (acquired immune deficiency syndrome) (HCC) On Biktarvy . On Mepron  for PCP prophylaxis.  Lab Results  Component Value Date/Time   HIV1RNAQUANT 60 03/18/2024 05:03 AM  Last CD4 count of 03-18-2024 was 416  C/o Right lateral thigh pain and tenderness, possible IT band syndrome 5/15 Started prednisone  40 mg p.o. daily for 3 days and Voltaren  gel 3 times daily. 5/16 RLE edema, started torsemide  40 mg po daily x 5 days and  K-Dur 20 mEq p.o. daily US  venous duplex: Negative for right lower extremity DVT.  5/17 possible component of neuropathy.  Patient notes burning sensation and numbness. -Start gabapentin  in addition to other medications  Bilateral lower extremity edema Possibly due to HFpEF exacerbation.  Patient was started on  torsemide  and has minimal R pedal edema.  Given patient's pain in this area an x-ray was obtained of her right ankle which showed no significant ankle pathology.  S/p torsemide , and potassium supplement  Edema resolved on 5/19, changed to torsemide  20 mg p.o. daily as needed for recurrent lower extremity edema   Cocaine abuse (HCC) Advised to quit using illicit drugs.  AKI (acute kidney injury) (HCC)-resolved.  Right knee/ leg pain- continue pain control with lidocaine , tylenol .  Abdominal pain-resolved   Hypotension-resolved  Off midodrine  therapy  Diarrhea- resolved as of 03/22/2024 Likely overflow constipation causing her diarrhea.  Patient did not like lactulose  or MiraLAX .  Continue Colace.  Generalized weakness-resolved as of 03/22/2024 Physical therapy recommending rehab  Pancytopenia (HCC)- resolved. Iron  deficiency anemia, transferrin saturation 10%, started oral iron  supplement with vitamin C  Folic acid  levels within normal range Follow B12 level  Encounter for assessment of decision-making capacity She lacks capacity to make medical decisions. TOC still attempting to pursue legal guardianship with the court system.  Obesity Class 1- BMI 31.39 Encouraged weight reduction.    Out of bed to chair. Incentive spirometry. Nursing supportive care. Fall, aspiration precautions. Diet:  Diet Orders (From admission, onward)     Start     Ordered   04/08/24 1449  DIET DYS 3 Room service appropriate? Yes; Fluid consistency: Thin; Fluid restriction: 1500 mL Fluid  Diet effective now       Question Answer Comment  Room service appropriate? Yes   Fluid consistency: Thin   Fluid restriction: 1500 mL Fluid      04/08/24 1448  DVT prophylaxis: enoxaparin  (LOVENOX ) injection 40 mg Start: 12/11/23 2200  Level of care: Med-Surg   Code Status: Full Code  Subjective: Patient states that she sometimes has a "fire" pain in R thigh and numbness in this area as  well. She also notes numbness in LLE.   Physical Exam: Vitals:   04/27/24 2101 04/28/24 0420 04/28/24 0456 04/28/24 0727  BP: 103/75 102/80  97/72  Pulse: 96 90    Resp: 18 16  18   Temp: 98.3 F (36.8 C) 98.5 F (36.9 C)  98.8 F (37.1 C)  TempSrc: Oral Oral    SpO2: 98% 97%  98%  Weight:   90.6 kg   Height:          Physical Exam  Constitutional: In no distress.  Cardiovascular: Normal rate, regular rhythm. No pedal edema Pulmonary: Non labored breathing on room air, no wheezing or rales.   Abdominal: Soft. Normal bowel sounds. Non distended and non tender Musculoskeletal: R lateral thigh TT mild palpation  Neurological: Alert and oriented to person, place, and time. Non focal  Skin: Skin is warm and dry.    Data Reviewed:      Latest Ref Rng & Units 04/28/2024    5:47 AM 04/26/2024    8:04 AM 04/17/2024    6:14 AM  CBC  WBC 4.0 - 10.5 K/uL 5.9  7.8  4.3   Hemoglobin 12.0 - 15.0 g/dL 29.5  9.3  62.1   Hematocrit 36.0 - 46.0 % 33.1  28.5  32.0   Platelets 150 - 400 K/uL 309  290  325       Latest Ref Rng & Units 04/28/2024    5:47 AM 04/27/2024    9:04 AM 04/26/2024    8:04 AM  BMP  Glucose 70 - 99 mg/dL 308  657  81   BUN 6 - 20 mg/dL 18  16  15    Creatinine 0.44 - 1.00 mg/dL 8.46  9.62  9.52   Sodium 135 - 145 mmol/L 140  139  143   Potassium 3.5 - 5.1 mmol/L 3.4  3.0  3.7   Chloride 98 - 111 mmol/L 103  100  108   CO2 22 - 32 mmol/L 27  31  25    Calcium  8.9 - 10.3 mg/dL 9.1  9.2  9.0    DG Foot Complete Right Result Date: 04/27/2024 CLINICAL DATA:  Right ankle swelling. EXAM: RIGHT FOOT COMPLETE - 3+ VIEW COMPARISON:  None Available. FINDINGS: Moderate hallux valgus. Diffuse decreased bone mineralization. Mild great toe metatarsophalangeal subchondral sclerosis. Mild soft tissue swelling medial to the great toe metatarsal head. Mild plantar calcaneal heel spur. Minimal dorsal talonavicular navicular-cuneiform degenerative osteophytes on lateral view. No acute  fracture is seen. No dislocation. No cortical erosion. Mild atherosclerotic calcifications. IMPRESSION: 1. Moderate hallux valgus. 2. Mild great toe metatarsophalangeal osteoarthritis. 3. Mild plantar calcaneal heel spur. Electronically Signed   By: Bertina Broccoli M.D.   On: 04/27/2024 11:30    Disposition: Status is: Inpatient Remains inpatient appropriate because: awaiting guardianship, placement  Planned Discharge Destination: Skilled nursing facility     Time spent: 35 minutes  Author: Althia Atlas, MD 04/28/2024 2:00 PM Secure chat 7am to 7pm For on call review [email or web address].

## 2024-04-29 DIAGNOSIS — F02818 Dementia in other diseases classified elsewhere, unspecified severity, with other behavioral disturbance: Secondary | ICD-10-CM | POA: Diagnosis not present

## 2024-04-29 DIAGNOSIS — B20 Human immunodeficiency virus [HIV] disease: Secondary | ICD-10-CM | POA: Diagnosis not present

## 2024-04-29 LAB — BASIC METABOLIC PANEL WITH GFR
Anion gap: 7 (ref 5–15)
BUN: 18 mg/dL (ref 6–20)
CO2: 28 mmol/L (ref 22–32)
Calcium: 9.3 mg/dL (ref 8.9–10.3)
Chloride: 103 mmol/L (ref 98–111)
Creatinine, Ser: 0.87 mg/dL (ref 0.44–1.00)
GFR, Estimated: >60 mL/min (ref >60)
Glucose, Bld: 151 mg/dL — ABNORMAL HIGH (ref 70–99)
Potassium: 4.2 mmol/L (ref 3.5–5.1)
Sodium: 138 mmol/L (ref 135–145)

## 2024-04-29 LAB — CBC
HCT: 31.6 % — ABNORMAL LOW (ref 36.0–46.0)
Hemoglobin: 9.9 g/dL — ABNORMAL LOW (ref 12.0–15.0)
MCH: 25.8 pg — ABNORMAL LOW (ref 26.0–34.0)
MCHC: 31.3 g/dL (ref 30.0–36.0)
MCV: 82.5 fL (ref 80.0–100.0)
Platelets: 310 10*3/uL (ref 150–400)
RBC: 3.83 MIL/uL — ABNORMAL LOW (ref 3.87–5.11)
RDW: 14.5 % (ref 11.5–15.5)
WBC: 4.7 10*3/uL (ref 4.0–10.5)
nRBC: 0 % (ref 0.0–0.2)

## 2024-04-29 LAB — PHOSPHORUS: Phosphorus: 5.1 mg/dL — ABNORMAL HIGH (ref 2.5–4.6)

## 2024-04-29 LAB — MAGNESIUM: Magnesium: 2.1 mg/dL (ref 1.7–2.4)

## 2024-04-29 MED ORDER — TUBERCULIN PPD 5 UNIT/0.1ML ID SOLN
5.0000 [IU] | Freq: Once | INTRADERMAL | Status: AC
  Administered 2024-04-29: 5 [IU] via INTRADERMAL
  Filled 2024-04-29: qty 0.1

## 2024-04-29 MED ORDER — CYANOCOBALAMIN 1000 MCG/ML IJ SOLN
1000.0000 ug | Freq: Once | INTRAMUSCULAR | Status: AC
  Administered 2024-04-29: 1000 ug via INTRAMUSCULAR
  Filled 2024-04-29: qty 1

## 2024-04-29 MED ORDER — VITAMIN B-12 1000 MCG PO TABS
1000.0000 ug | ORAL_TABLET | Freq: Every day | ORAL | Status: DC
  Administered 2024-04-30 – 2024-05-07 (×8): 1000 ug via ORAL
  Filled 2024-04-29 (×8): qty 1

## 2024-04-29 NOTE — Progress Notes (Signed)
 Progress Note   Patient: Meghan Jarvis WUJ:[id number] DOB: 09-27-1976 DOA: 12/08/2023     143 DOS: the patient was seen and examined on 04/29/2024   Brief hospital course: HPI/Hospital Course: 47yo with h/o polysubstance (ETOH, cocaine) abuse, afib, homelessness, AIDS, hypothyroidism, and DM who presented on 12/08/23 with acute respiratory failure.  She was found down by a friend and could not wake her up.  She was found to be hypoxic in the 70s. Admitted to ICU, intubated, started on antibiotics for pneumonia.  12/31: extubated. ID consulted.  MRI brain with advanced cerebral atrophy.  Psych evaluated - "Patient is lacking and reasoning in her medical care for example she is unable to describe the consequence that she will have if she does not take her medications or has complications with her chronic conditions" and needs assistance with housing and finances due to cognitive decline.  APS/DSS pending guardianship and will need placement.  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 On Mepron  for PCP prophylaxis.   AIDS (acquired immune deficiency syndrome) (HCC) On Biktarvy . On Mepron  for PCP prophylaxis.  Lab Results  Component Value Date/Time   HIV1RNAQUANT 60 03/18/2024 05:03 AM  Last CD4 count of 03-18-2024 was 416  C/o Right lateral thigh pain and tenderness, possible IT band syndrome 5/15 Started prednisone  40 mg p.o. daily for 3 days and Voltaren  gel 3 times daily. 5/16 RLE edema, started torsemide  40 mg po daily x 5 days and  K-Dur 20 mEq p.o. daily US  venous duplex: Negative for right lower extremity DVT.  5/17 possible component of neuropathy.  Patient notes burning sensation and numbness. -Start gabapentin  in addition to other medications  Bilateral lower extremity edema Possibly due to HFpEF exacerbation.  Patient was started on  torsemide  and has minimal R pedal edema.  Given patient's pain in this area an x-ray was obtained of her right ankle which showed no significant ankle pathology.  S/p torsemide , and potassium supplement  Edema resolved on 5/19, changed to torsemide  20 mg p.o. daily as needed for recurrent lower extremity edema   Cocaine abuse (HCC) Advised to quit using illicit drugs.  AKI (acute kidney injury) (HCC)-resolved.  Right knee/ leg pain- continue pain control with lidocaine , tylenol .  Abdominal pain-resolved   Hypotension-resolved  Off midodrine  therapy  Diarrhea- resolved as of 03/22/2024 Likely overflow constipation causing her diarrhea.  Patient did not like lactulose  or MiraLAX .  Continue Colace.  Generalized weakness-resolved as of 03/22/2024 Physical therapy recommending rehab  Pancytopenia (HCC)- resolved. Iron  deficiency anemia, transferrin saturation 10%, started oral iron  supplement with vitamin C  Folic acid  levels within normal range Vitamin B12 level 290, goal >400.  S/p vitamin B12 1000 mcg IM injection x 1 dose followed by oral supplement.  Encounter for assessment of decision-making capacity She lacks capacity to make medical decisions. TOC attempted to pursue legal guardianship with the court system.  Obesity Class 1- BMI 31.39 Encouraged weight reduction.   Out of bed to chair. Incentive spirometry. Nursing supportive care. Fall, aspiration precautions. Diet:  Diet Orders (From admission, onward)     Start     Ordered   04/08/24 1449  DIET DYS 3 Room service appropriate? Yes; Fluid consistency: Thin; Fluid restriction: 1500 mL Fluid  Diet effective now       Question Answer Comment  Room service appropriate? Yes   Fluid consistency: Thin   Fluid restriction: 1500 mL  Fluid      04/08/24 1448           DVT prophylaxis: enoxaparin  (LOVENOX ) injection 40 mg Start: 12/11/23 2200  Level of care: Med-Surg   Code Status: Full Code  Subjective: No  significant events overnight, patient still has pain in the right lateral thigh, improved with topical Voltaren .  Advised to continue to use.  Today complaining of right lower ribs tenderness, advised to apply Voltaren  gel.    Physical Exam: Vitals:   04/28/24 0727 04/29/24 0409 04/29/24 0500 04/29/24 0828  BP: 97/72 93/79  106/82  Pulse:  89  95  Resp: 18 16  18   Temp: 98.8 F (37.1 C) 98.2 F (36.8 C)  98.4 F (36.9 C)  TempSrc:      SpO2: 98% 98%  98%  Weight:   92.3 kg   Height:          Physical Exam  Constitutional: In no distress.  Cardiovascular: Normal rate, regular rhythm. No pedal edema Pulmonary: Non labored breathing on room air, no wheezing or rales.   Abdominal: Soft. Normal bowel sounds. Non distended and non tender Musculoskeletal: R lateral thigh TT mild palpation  Neurological: Alert and oriented to person, place, and time. Non focal  Skin: Skin is warm and dry.    Data Reviewed:      Latest Ref Rng & Units 04/29/2024    4:42 AM 04/28/2024    5:47 AM 04/26/2024    8:04 AM  CBC  WBC 4.0 - 10.5 K/uL 4.7  5.9  7.8   Hemoglobin 12.0 - 15.0 g/dL 9.9  40.9  9.3   Hematocrit 36.0 - 46.0 % 31.6  33.1  28.5   Platelets 150 - 400 K/uL 310  309  290       Latest Ref Rng & Units 04/29/2024    4:42 AM 04/28/2024    5:47 AM 04/27/2024    9:04 AM  BMP  Glucose 70 - 99 mg/dL 811  914  782   BUN 6 - 20 mg/dL 18  18  16    Creatinine 0.44 - 1.00 mg/dL 9.56  2.13  0.86   Sodium 135 - 145 mmol/L 138  140  139   Potassium 3.5 - 5.1 mmol/L 4.2  3.4  3.0   Chloride 98 - 111 mmol/L 103  103  100   CO2 22 - 32 mmol/L 28  27  31    Calcium  8.9 - 10.3 mg/dL 9.3  9.1  9.2    No results found.   Disposition: Status is: Inpatient Remains inpatient appropriate because: awaiting guardianship, placement  Planned Discharge Destination: Skilled nursing facility     Time spent: 35 minutes  Author: Althia Atlas, MD 04/29/2024 3:08 PM Secure chat 7am to 7pm For on call  review [email or web address].

## 2024-04-29 NOTE — Plan of Care (Signed)
  Problem: Coping: Goal: Ability to adjust to condition or change in health will improve Outcome: Progressing   Problem: Fluid Volume: Goal: Ability to maintain a balanced intake and output will improve Outcome: Progressing   Problem: Health Behavior/Discharge Planning: Goal: Ability to identify and utilize available resources and services will improve Outcome: Progressing Goal: Ability to manage health-related needs will improve Outcome: Progressing   Problem: Metabolic: Goal: Ability to maintain appropriate glucose levels will improve Outcome: Progressing   Problem: Nutritional: Goal: Maintenance of adequate nutrition will improve Outcome: Progressing Goal: Progress toward achieving an optimal weight will improve Outcome: Progressing   Problem: Skin Integrity: Goal: Risk for impaired skin integrity will decrease Outcome: Progressing   Problem: Tissue Perfusion: Goal: Adequacy of tissue perfusion will improve Outcome: Progressing   Problem: Education: Goal: Knowledge of General Education information will improve Description: Including pain rating scale, medication(s)/side effects and non-pharmacologic comfort measures Outcome: Progressing   Problem: Health Behavior/Discharge Planning: Goal: Ability to manage health-related needs will improve Outcome: Progressing   Problem: Clinical Measurements: Goal: Ability to maintain clinical measurements within normal limits will improve Outcome: Progressing Goal: Will remain free from infection Outcome: Progressing Goal: Diagnostic test results will improve Outcome: Progressing Goal: Respiratory complications will improve Outcome: Progressing Goal: Cardiovascular complication will be avoided Outcome: Progressing   Problem: Activity: Goal: Risk for activity intolerance will decrease Outcome: Progressing   Problem: Nutrition: Goal: Adequate nutrition will be maintained Outcome: Progressing   Problem: Coping: Goal:  Level of anxiety will decrease Outcome: Progressing   Problem: Elimination: Goal: Will not experience complications related to bowel motility Outcome: Progressing Goal: Will not experience complications related to urinary retention Outcome: Progressing   Problem: Pain Management: Goal: General experience of comfort will improve Outcome: Progressing   Problem: Safety: Goal: Ability to remain free from injury will improve Outcome: Progressing   Problem: Skin Integrity: Goal: Risk for impaired skin integrity will decrease Outcome: Progressing   Problem: Activity: Goal: Ability to tolerate increased activity will improve Outcome: Progressing   Problem: Respiratory: Goal: Ability to maintain a clear airway and adequate ventilation will improve Outcome: Progressing

## 2024-04-29 NOTE — TOC Progression Note (Signed)
 Transition of Care [facility]) - Progression Note    Patient Details  Name: Meghan Jarvis MRN: [id number] Date of Birth: June 20, 1976  [facility] of Care [facility]) CM/SW Contact  Odilia Bennett, LCSW Phone Number: 04/29/2024, 10:08 AM  Clinical Narrative:  Group home owner is waiting on a call back from DSS. MD will order TB skin test.   Expected Discharge Plan and Services         Expected Discharge Date: 01/10/24                                     Social Determinants of Health (SDOH) Interventions SDOH Screenings   Food Insecurity: Patient Unable To Answer (12/09/2023)  Recent Concern: Food Insecurity - Food Insecurity Present (09/26/2023)  Housing: Patient Unable To Answer (12/09/2023)  Recent Concern: Housing - Medium Risk (09/26/2023)  Transportation Needs: Patient Unable To Answer (12/09/2023)  Recent Concern: Transportation Needs - Unmet Transportation Needs (10/11/2023)  Utilities: Patient Unable To Answer (12/09/2023)  Recent Concern: Utilities - At Risk (09/25/2023)  Tobacco Use: High Risk (12/08/2023)    Readmission Risk Interventions     No data to display

## 2024-04-29 NOTE — Plan of Care (Signed)
  Problem: Coping: Goal: Ability to adjust to condition or change in health will improve Outcome: Progressing   Problem: Fluid Volume: Goal: Ability to maintain a balanced intake and output will improve Outcome: Progressing   Problem: Health Behavior/Discharge Planning: Goal: Ability to identify and utilize available resources and services will improve Outcome: Progressing Goal: Ability to manage health-related needs will improve Outcome: Progressing   Problem: Metabolic: Goal: Ability to maintain appropriate glucose levels will improve Outcome: Progressing   Problem: Nutritional: Goal: Maintenance of adequate nutrition will improve Outcome: Progressing Goal: Progress toward achieving an optimal weight will improve Outcome: Progressing   Problem: Skin Integrity: Goal: Risk for impaired skin integrity will decrease Outcome: Progressing   Problem: Tissue Perfusion: Goal: Adequacy of tissue perfusion will improve Outcome: Progressing   Problem: Education: Goal: Knowledge of General Education information will improve Description: Including pain rating scale, medication(s)/side effects and non-pharmacologic comfort measures Outcome: Progressing   Problem: Health Behavior/Discharge Planning: Goal: Ability to manage health-related needs will improve Outcome: Progressing   Problem: Clinical Measurements: Goal: Ability to maintain clinical measurements within normal limits will improve Outcome: Progressing Goal: Will remain free from infection Outcome: Progressing Goal: Diagnostic test results will improve Outcome: Progressing Goal: Respiratory complications will improve Outcome: Progressing Goal: Cardiovascular complication will be avoided Outcome: Progressing

## 2024-04-30 DIAGNOSIS — F02818 Dementia in other diseases classified elsewhere, unspecified severity, with other behavioral disturbance: Secondary | ICD-10-CM | POA: Diagnosis not present

## 2024-04-30 DIAGNOSIS — B20 Human immunodeficiency virus [HIV] disease: Secondary | ICD-10-CM | POA: Diagnosis not present

## 2024-04-30 NOTE — TOC Progression Note (Signed)
 Transition of Care [facility]) - Progression Note    Patient Details  Name: Meghan Jarvis MRN: [id number] Date of Birth: Sep 13, 1976  [facility]) CM/SW Contact  Loman Risk, RN Phone Number: 04/30/2024, 3:38 PM  Clinical Narrative:     VM left for [facility] group home owner to determine if he heard back from DSS  Expected Discharge Plan: Group Home    Expected Discharge Plan and Services         Expected Discharge Date: 01/10/24                                     Social Determinants of Health (SDOH) Interventions SDOH Screenings   Food Insecurity: Patient Unable To Answer (12/09/2023)  Recent Concern: Food Insecurity - Food Insecurity Present (09/26/2023)  Housing: Patient Unable To Answer (12/09/2023)  Recent Concern: Housing - Medium Risk (09/26/2023)  Transportation Needs: Patient Unable To Answer (12/09/2023)  Recent Concern: Transportation Needs - Unmet Transportation Needs (10/11/2023)  Utilities: Patient Unable To Answer (12/09/2023)  Recent Concern: Utilities - At Risk (09/25/2023)  Tobacco Use: High Risk (12/08/2023)    Readmission Risk Interventions     No data to display

## 2024-04-30 NOTE — Progress Notes (Addendum)
 Progress Note   Patient: Meghan Jarvis [id number]:[id number] DOB: 1976-01-06 DOA: 12/08/2023     144 DOS: the patient was seen and examined on 04/30/2024   Brief hospital course: HPI/Hospital Course: 48yo with h/o polysubstance (ETOH, cocaine) abuse, afib, homelessness, AIDS, hypothyroidism, and DM who presented on 12/08/23 with acute respiratory failure.  She was found down by a friend and could not wake her up.  She was found to be hypoxic in the 70s. Admitted to ICU, intubated, started on antibiotics for pneumonia.  12/31: extubated. ID consulted.  MRI brain with advanced cerebral atrophy.  Psych evaluated - "Patient is lacking and reasoning in her medical care for example she is unable to describe the consequence that she will have if she does not take her medications or has complications with her chronic conditions" and needs assistance with housing and finances due to cognitive decline.  APS/DSS pending guardianship and will need placement.  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 On Mepron  for PCP prophylaxis.   AIDS (acquired immune deficiency syndrome) (HCC) On Biktarvy . On Mepron  for PCP prophylaxis.  Lab Results  Component Value Date/Time   HIV1RNAQUANT 60 03/18/2024 05:03 AM  Last CD4 count of 03-18-2024 was 416  C/o Right lateral thigh pain and tenderness, possible IT band syndrome 5/15 Started prednisone  40 mg p.o. daily for 3 days and Voltaren  gel 3 times daily. 5/16 RLE edema, started torsemide  40 mg po daily x 5 days and  K-Dur 20 mEq p.o. daily US  venous duplex: Negative for right lower extremity DVT.  5/17 possible component of neuropathy.  Patient notes burning sensation and numbness. -Start gabapentin  in addition to other medications  Bilateral lower extremity edema Possibly due to HFpEF exacerbation.  Patient was started on  torsemide  and has minimal R pedal edema.  Given patient's pain in this area an x-ray was obtained of her right ankle which showed no significant ankle pathology.  S/p torsemide , and potassium supplement  Edema resolved on 5/19, changed to torsemide  20 mg p.o. daily as needed for recurrent lower extremity edema   Cocaine abuse (HCC) Advised to quit using illicit drugs.  AKI (acute kidney injury) (HCC)-resolved.  Right knee/ leg pain- continue pain control with lidocaine , tylenol .  Abdominal pain-resolved   Hypotension-resolved  Off midodrine  therapy  Diarrhea- resolved as of 03/22/2024 Likely overflow constipation causing her diarrhea.  Patient did not like lactulose  or MiraLAX .  Continue Colace.  Generalized weakness-resolved as of 03/22/2024 Physical therapy recommending rehab  Pancytopenia (HCC)- resolved. Iron  deficiency anemia, transferrin saturation 10%, started oral iron  supplement with vitamin C  Folic acid  levels within normal range Vitamin B12 level 290, goal >400.  S/p vitamin B12 1000 mcg IM injection x 1 dose followed by oral supplement.  Encounter for assessment of decision-making capacity She lacks capacity to make medical decisions. TOC attempted to pursue legal guardianship with the court system.  Obesity Class 1- BMI 31.39 Encouraged weight reduction.   Out of bed to chair. Incentive spirometry. Nursing supportive care. Fall, aspiration precautions. Diet:  Diet Orders (From admission, onward)     Start     Ordered   04/08/24 1449  DIET DYS 3 Room service appropriate? Yes; Fluid consistency: Thin; Fluid restriction: 1500 mL Fluid  Diet effective now       Question Answer Comment  Room service appropriate? Yes   Fluid consistency: Thin   Fluid restriction: 1500 mL  Fluid      04/08/24 1448           DVT prophylaxis: enoxaparin  (LOVENOX ) injection 40 mg Start: 12/11/23 2200  Level of care: Med-Surg   Code Status: Full Code  Subjective: No  significant events overnight, patient still complaining of right lateral thigh pain, tenderness and right lower rib tenderness.  Advised to continue Voltaren  gel.  Denies any other complaints.  Resting comfortably without any acute distress.    Physical Exam: Vitals:   04/29/24 1606 04/29/24 2026 04/30/24 0324 04/30/24 1340  BP: 109/73 105/66 (!) 97/59 122/77  Pulse: (!) 103 (!) 104 97 (!) 102  Resp: 18 16 16 16   Temp: 98.4 F (36.9 C) 98.4 F (36.9 C) 98.1 F (36.7 C)   TempSrc: Oral Oral Oral   SpO2: 98% 98% 99% 100%  Weight:      Height:          Physical Exam  Constitutional: In no distress.  Cardiovascular: Normal rate, regular rhythm. No pedal edema Pulmonary: Non labored breathing on room air, no wheezing or rales.   Abdominal: Soft. Normal bowel sounds. Non distended and non tender Musculoskeletal: R lateral thigh TT mild palpation  Neurological: Alert and oriented to person, place, and time. Non focal  Skin: Skin is warm and dry.    Data Reviewed:      Latest Ref Rng & Units 04/29/2024    4:42 AM 04/28/2024    5:47 AM 04/26/2024    8:04 AM  CBC  WBC 4.0 - 10.5 K/uL 4.7  5.9  7.8   Hemoglobin 12.0 - 15.0 g/dL 9.9  16.1  9.3   Hematocrit 36.0 - 46.0 % 31.6  33.1  28.5   Platelets 150 - 400 K/uL 310  309  290       Latest Ref Rng & Units 04/29/2024    4:42 AM 04/28/2024    5:47 AM 04/27/2024    9:04 AM  BMP  Glucose 70 - 99 mg/dL 096  045  409   BUN 6 - 20 mg/dL 18  18  16    Creatinine 0.44 - 1.00 mg/dL 8.11  9.14  7.82   Sodium 135 - 145 mmol/L 138  140  139   Potassium 3.5 - 5.1 mmol/L 4.2  3.4  3.0   Chloride 98 - 111 mmol/L 103  103  100   CO2 22 - 32 mmol/L 28  27  31    Calcium  8.9 - 10.3 mg/dL 9.3  9.1  9.2    No results found.   Disposition: Status is: Inpatient Remains inpatient appropriate because: awaiting guardianship, placement  Planned Discharge Destination: Skilled nursing facility      Time spent: 25 minutes  Author: Althia Atlas, MD 04/30/2024 3:02 PM Secure chat 7am to 7pm For on call review [email or web address].

## 2024-05-01 DIAGNOSIS — F02818 Dementia in other diseases classified elsewhere, unspecified severity, with other behavioral disturbance: Secondary | ICD-10-CM | POA: Diagnosis not present

## 2024-05-01 DIAGNOSIS — B20 Human immunodeficiency virus [HIV] disease: Secondary | ICD-10-CM | POA: Diagnosis not present

## 2024-05-01 NOTE — TOC Progression Note (Addendum)
 Transition of Care [facility]) - Progression Note    Patient Details  Name: Meghan Jarvis MRN: [id number] Date of Birth: 1976-01-04  Transition of [facility]) CM/SW Contact  Odilia Bennett, LCSW Phone Number: 05/01/2024, 8:53 AM  Clinical Narrative:  CSW sent another secure email to APS social worker and supervisor to see if there are any updates on talking to local group home owner.   9:02 am: Received call back from APS social worker. They are hoping that patient will discharge to facility on Tuesday. PPD results still pending.  Expected Discharge Plan: Group Home    Expected Discharge Plan and Services         Expected Discharge Date: 01/10/24                                     Social Determinants of Health (SDOH) Interventions SDOH Screenings   Food Insecurity: Patient Unable To Answer (12/09/2023)  Recent Concern: Food Insecurity - Food Insecurity Present (09/26/2023)  Housing: Patient Unable To Answer (12/09/2023)  Recent Concern: Housing - Medium Risk (09/26/2023)  Transportation Needs: Patient Unable To Answer (12/09/2023)  Recent Concern: Transportation Needs - Unmet Transportation Needs (10/11/2023)  Utilities: Patient Unable To Answer (12/09/2023)  Recent Concern: Utilities - At Risk (09/25/2023)  Tobacco Use: High Risk (12/08/2023)    Readmission Risk Interventions     No data to display

## 2024-05-01 NOTE — Progress Notes (Signed)
 Progress Note   Patient: Meghan Jarvis [id number]:[id number] DOB: [birth year] DOA: 12/08/2023     145 DOS: the patient was seen and examined on 05/01/2024   Brief hospital course: HPI/Hospital Course: 47yo with h/o polysubstance (ETOH, cocaine) abuse, afib, homelessness, AIDS, hypothyroidism, and DM who presented on 12/08/23 with acute respiratory failure.  She was found down by a friend and could not wake her up.  She was found to be hypoxic in the 70s. Admitted to ICU, intubated, started on antibiotics for pneumonia.  12/31: extubated. ID consulted.  MRI brain with advanced cerebral atrophy.  Psych evaluated - "Patient is lacking and reasoning in her medical care for example she is unable to describe the consequence that she will have if she does not take her medications or has complications with her chronic conditions" and needs assistance with housing and finances due to cognitive decline.  APS/DSS pending guardianship and will need placement.  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 On Mepron  for PCP prophylaxis.   AIDS (acquired immune deficiency syndrome) (HCC) On Biktarvy . On Mepron  for PCP prophylaxis.  Lab Results  Component Value Date/Time   HIV1RNAQUANT 60 03/18/2024 05:03 AM  Last CD4 count of 03-18-2024 was 416  C/o Right lateral thigh pain and tenderness, possible IT band syndrome 5/15 Started prednisone  40 mg p.o. daily for 3 days and Voltaren  gel 3 times daily. 5/16 RLE edema, started torsemide  40 mg po daily x 5 days and  K-Dur 20 mEq p.o. daily US  venous duplex: Negative for right lower extremity DVT.  5/17 possible component of neuropathy.  Patient notes burning sensation and numbness. -Start gabapentin  in addition to other medications  Bilateral lower extremity edema.  Resolved Possibly due to HFpEF exacerbation.  Patient was  started on torsemide  and has minimal R pedal edema.  Given patient's pain in this area an x-ray was obtained of her right ankle which showed no significant ankle pathology.  S/p torsemide , and potassium supplement  Edema resolved on 5/19, changed to torsemide  20 mg p.o. daily as needed for recurrent lower extremity edema   Cocaine abuse (HCC) Advised to quit using illicit drugs.  AKI (acute kidney injury) (HCC)-resolved.  Right knee/ leg pain- continue pain control with lidocaine , tylenol .  Abdominal pain-resolved   Hypotension-resolved  Off midodrine  therapy  Diarrhea- resolved as of 03/22/2024 Likely overflow constipation causing her diarrhea.  Patient did not like lactulose  or MiraLAX .  Continue Colace.  Generalized weakness-resolved as of 03/22/2024 Physical therapy recommending rehab  Pancytopenia (HCC)- resolved. Iron  deficiency anemia, transferrin saturation 10%, started oral iron  supplement with vitamin C  Folic acid  levels within normal range Vitamin B12 level 290, goal >400.  S/p vitamin B12 1000 mcg IM injection x 1 dose followed by oral supplement.  Encounter for assessment of decision-making capacity She lacks capacity to make medical decisions. TOC attempted to pursue legal guardianship with the court system.  Obesity Class 1- BMI 31.39 Encouraged weight reduction.   Out of bed to chair. Incentive spirometry. Nursing supportive care. Fall, aspiration precautions. Diet:  Diet Orders (From admission, onward)     Start     Ordered   04/08/24 1449  DIET DYS 3 Room service appropriate? Yes; Fluid consistency: Thin; Fluid restriction: 1500 mL Fluid  Diet effective now       Question Answer Comment  Room service appropriate? Yes   Fluid consistency: Thin   Fluid restriction:  1500 mL Fluid      04/08/24 1448           DVT prophylaxis: enoxaparin  (LOVENOX ) injection 40 mg Start: 12/11/23 2200  Level of care: Med-Surg   Code Status: Full Code  Subjective:  No significant events overnight, patient seems to be laying comfortably, still has pain in the right lateral thigh, no edema.  Denies any other complaints. Patient stated that Voltaren  gel does help.    Physical Exam: Vitals:   04/30/24 2048 05/01/24 0250 05/01/24 0816 05/01/24 1505  BP: 117/86 101/69 125/83 117/86  Pulse: 97 95 95 98  Resp: 20 18 19 18   Temp: 98.5 F (36.9 C) 97.8 F (36.6 C)  98.3 F (36.8 C)  TempSrc:      SpO2: 100% 97% 100% 99%  Weight:      Height:          Physical Exam  Constitutional: In no distress.  Cardiovascular: Normal rate, regular rhythm. No pedal edema Pulmonary: Non labored breathing on room air, no wheezing or rales.   Abdominal: Soft. Normal bowel sounds. Non distended and non tender Musculoskeletal: R lateral thigh TT mild palpation  Neurological: Alert and oriented to person, place, and time. Non focal  Skin: Skin is warm and dry.    Data Reviewed:      Latest Ref Rng & Units 04/29/2024    4:42 AM 04/28/2024    5:47 AM 04/26/2024    8:04 AM  CBC  WBC 4.0 - 10.5 K/uL 4.7  5.9  7.8   Hemoglobin 12.0 - 15.0 g/dL 9.9  54.0  9.3   Hematocrit 36.0 - 46.0 % 31.6  33.1  28.5   Platelets 150 - 400 K/uL 310  309  290       Latest Ref Rng & Units 04/29/2024    4:42 AM 04/28/2024    5:47 AM 04/27/2024    9:04 AM  BMP  Glucose 70 - 99 mg/dL 981  191  478   BUN 6 - 20 mg/dL 18  18  16    Creatinine 0.44 - 1.00 mg/dL 2.95  6.21  3.08   Sodium 135 - 145 mmol/L 138  140  139   Potassium 3.5 - 5.1 mmol/L 4.2  3.4  3.0   Chloride 98 - 111 mmol/L 103  103  100   CO2 22 - 32 mmol/L 28  27  31    Calcium  8.9 - 10.3 mg/dL 9.3  9.1  9.2    No results found.   Disposition: Status is: Inpatient Remains inpatient appropriate because: awaiting guardianship, placement  Planned Discharge Destination: Group home APS social worker informed to [facility] that patient may go to the facility on Tuesday 05/06/2024      Time spent: 25  minutes  Author: Althia Atlas, MD 05/01/2024 4:29 PM Secure chat 7am to 7pm For on call review [email or web address].

## 2024-05-02 DIAGNOSIS — B20 Human immunodeficiency virus [HIV] disease: Secondary | ICD-10-CM | POA: Diagnosis not present

## 2024-05-02 DIAGNOSIS — F02818 Dementia in other diseases classified elsewhere, unspecified severity, with other behavioral disturbance: Secondary | ICD-10-CM | POA: Diagnosis not present

## 2024-05-02 NOTE — Plan of Care (Signed)
  Problem: Coping: Goal: Ability to adjust to condition or change in health will improve Outcome: Progressing   Problem: Nutritional: Goal: Maintenance of adequate nutrition will improve Outcome: Progressing   Problem: Skin Integrity: Goal: Risk for impaired skin integrity will decrease Outcome: Progressing   Problem: Education: Goal: Knowledge of General Education information will improve Description: Including pain rating scale, medication(s)/side effects and non-pharmacologic comfort measures Outcome: Progressing   Problem: Activity: Goal: Risk for activity intolerance will decrease Outcome: Progressing   Problem: Nutrition: Goal: Adequate nutrition will be maintained Outcome: Progressing   Problem: Coping: Goal: Level of anxiety will decrease Outcome: Progressing   Problem: Safety: Goal: Ability to remain free from injury will improve Outcome: Progressing

## 2024-05-02 NOTE — Plan of Care (Signed)
  Problem: Coping: Goal: Ability to adjust to condition or change in health will improve Outcome: Progressing   Problem: Fluid Volume: Goal: Ability to maintain a balanced intake and output will improve Outcome: Progressing   Problem: Health Behavior/Discharge Planning: Goal: Ability to identify and utilize available resources and services will improve Outcome: Progressing   Problem: Education: Goal: Knowledge of General Education information will improve Description: Including pain rating scale, medication(s)/side effects and non-pharmacologic comfort measures Outcome: Progressing   Problem: Health Behavior/Discharge Planning: Goal: Ability to manage health-related needs will improve Outcome: Progressing   Problem: Clinical Measurements: Goal: Will remain free from infection Outcome: Progressing

## 2024-05-02 NOTE — Progress Notes (Signed)
   05/02/24 1000  PPD Results  Does patient have an induration at the injection site? Yes  Induration(mm) 1 mm  Name of Physician Notified Dr. Hubert Madden

## 2024-05-02 NOTE — Progress Notes (Signed)
 PPD read in right arm. Results less than 1 mm induration. MD aware.

## 2024-05-02 NOTE — TOC Progression Note (Signed)
 Transition of Care [facility]) - Progression Note    Patient Details  Name: Meghan Jarvis MRN: [id number] Date of Birth: 02-Jul-1976  Transition of Care [facility]) CM/SW Contact  Loman Risk, RN Phone Number: 05/02/2024, 3:36 PM  Clinical Narrative:     PPD results faxed to [facility]  Expected Discharge Plan: Group Home    Expected Discharge Plan and Services         Expected Discharge Date: 01/10/24                                     Social Determinants of Health (SDOH) Interventions SDOH Screenings   Food Insecurity: Patient Unable To Answer (12/09/2023)  Recent Concern: Food Insecurity - Food Insecurity Present (09/26/2023)  Housing: Patient Unable To Answer (12/09/2023)  Recent Concern: Housing - Medium Risk (09/26/2023)  Transportation Needs: Patient Unable To Answer (12/09/2023)  Recent Concern: Transportation Needs - Unmet Transportation Needs (10/11/2023)  Utilities: Patient Unable To Answer (12/09/2023)  Recent Concern: Utilities - At Risk (09/25/2023)  Tobacco Use: High Risk (12/08/2023)    Readmission Risk Interventions     No data to display

## 2024-05-02 NOTE — Progress Notes (Signed)
 Progress Note   Patient: Meghan Jarvis UVO:[id number] DOB: 07/22/1976 DOA: 12/08/2023     146 DOS: the patient was seen and examined on 05/02/2024   Brief hospital course: HPI/Hospital Course: 48yo with h/o polysubstance (ETOH, cocaine) abuse, afib, homelessness, AIDS, hypothyroidism, and DM who presented on 12/08/23 with acute respiratory failure.  She was found down by a friend and could not wake her up.  She was found to be hypoxic in the 70s. Admitted to ICU, intubated, started on antibiotics for pneumonia.  12/31: extubated. ID consulted.  MRI brain with advanced cerebral atrophy.  Psych evaluated - "Patient is lacking and reasoning in her medical care for example she is unable to describe the consequence that she will have if she does not take her medications or has complications with her chronic conditions" and needs assistance with housing and finances due to cognitive decline.  APS/DSS pending guardianship and will need placement.  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 On Mepron  for PCP prophylaxis.   AIDS (acquired immune deficiency syndrome) (HCC) On Biktarvy . On Mepron  for PCP prophylaxis.  Lab Results  Component Value Date/Time   HIV1RNAQUANT 60 03/18/2024 05:03 AM  Last CD4 count of 03-18-2024 was 416  C/o Right lateral thigh pain and tenderness, possible IT band syndrome 5/15 Started prednisone  40 mg p.o. daily for 3 days and Voltaren  gel 3 times daily. 5/16 RLE edema, started torsemide  40 mg po daily x 5 days and  K-Dur 20 mEq p.o. daily US  venous duplex: Negative for right lower extremity DVT.  5/17 possible component of neuropathy.  Patient notes burning sensation and numbness. -Start gabapentin  in addition to other medications  Bilateral lower extremity edema.  Resolved Possibly due to HFpEF exacerbation.  Patient was  started on torsemide  and has minimal R pedal edema.  Given patient's pain in this area an x-ray was obtained of her right ankle which showed no significant ankle pathology.  S/p torsemide , and potassium supplement  Edema resolved on 5/19, changed to torsemide  20 mg p.o. daily as needed for recurrent lower extremity edema   Cocaine abuse (HCC) Advised to quit using illicit drugs.  AKI (acute kidney injury) (HCC)-resolved.  Right knee/ leg pain- continue pain control with lidocaine , tylenol .  Abdominal pain-resolved   Hypotension-resolved  Off midodrine  therapy  Diarrhea- resolved as of 03/22/2024 Likely overflow constipation causing her diarrhea.  Patient did not like lactulose  or MiraLAX .  Continue Colace.  Generalized weakness-resolved as of 03/22/2024 Physical therapy recommending rehab  Pancytopenia (HCC)- resolved. Iron  deficiency anemia, transferrin saturation 10%, started oral iron  supplement with vitamin C  Folic acid  levels within normal range Vitamin B12 level 290, goal >400.  S/p vitamin B12 1000 mcg IM injection x 1 dose followed by oral supplement.  Encounter for assessment of decision-making capacity She lacks capacity to make medical decisions. TOC attempted to pursue legal guardianship with the court system.  Obesity Class 1- BMI 31.39 Encouraged weight reduction.   Out of bed to chair. Incentive spirometry. Nursing supportive care. Fall, aspiration precautions. Diet:  Diet Orders (From admission, onward)     Start     Ordered   04/08/24 1449  DIET DYS 3 Room service appropriate? Yes; Fluid consistency: Thin; Fluid restriction: 1500 mL Fluid  Diet effective now       Question Answer Comment  Room service appropriate? Yes   Fluid consistency: Thin   Fluid restriction:  1500 mL Fluid      04/08/24 1448           DVT prophylaxis: enoxaparin  (LOVENOX ) injection 40 mg Start: 12/11/23 2200  Level of care: Med-Surg   Code Status: Full Code  Subjective:  No significant events overnight, today patient is complaining of backache, having difficulty ambulation but by pain is getting better. Patient was advised to continue as needed pain medications.   Physical Exam: Vitals:   05/01/24 2051 05/02/24 0414 05/02/24 0854 05/02/24 1649  BP: 106/73 118/82 103/61 101/67  Pulse: (!) 108 94 (!) 105 94  Resp: 18 20 17 17   Temp: 98.4 F (36.9 C) 97.7 F (36.5 C)  98 F (36.7 C)  TempSrc: Oral Oral  Oral  SpO2: 98% 99% 100% 100%  Weight:      Height:          Physical Exam  Constitutional: In no distress.  Cardiovascular: Normal rate, regular rhythm. No pedal edema Pulmonary: Non labored breathing on room air, no wheezing or rales.   Abdominal: Soft. Normal bowel sounds. Non distended and non tender Musculoskeletal: R lateral thigh TT mild palpation  Neurological: Alert and oriented to person, place, and time. Non focal  Skin: Skin is warm and dry.    Data Reviewed:      Latest Ref Rng & Units 04/29/2024    4:42 AM 04/28/2024    5:47 AM 04/26/2024    8:04 AM  CBC  WBC 4.0 - 10.5 K/uL 4.7  5.9  7.8   Hemoglobin 12.0 - 15.0 g/dL 9.9  98.1  9.3   Hematocrit 36.0 - 46.0 % 31.6  33.1  28.5   Platelets 150 - 400 K/uL 310  309  290       Latest Ref Rng & Units 04/29/2024    4:42 AM 04/28/2024    5:47 AM 04/27/2024    9:04 AM  BMP  Glucose 70 - 99 mg/dL 191  478  295   BUN 6 - 20 mg/dL 18  18  16    Creatinine 0.44 - 1.00 mg/dL 6.21  3.08  6.57   Sodium 135 - 145 mmol/L 138  140  139   Potassium 3.5 - 5.1 mmol/L 4.2  3.4  3.0   Chloride 98 - 111 mmol/L 103  103  100   CO2 22 - 32 mmol/L 28  27  31    Calcium  8.9 - 10.3 mg/dL 9.3  9.1  9.2    No results found.   Disposition: Status is: Inpatient Remains inpatient appropriate because: awaiting guardianship, placement  Planned Discharge Destination: Group home APS social worker informed to [facility] that patient may go to the facility on Tuesday 05/06/2024      Time spent: 25  minutes  Author: Althia Atlas, MD 05/02/2024 5:23 PM Secure chat 7am to 7pm For on call review [email or web address].

## 2024-05-03 DIAGNOSIS — F02818 Dementia in other diseases classified elsewhere, unspecified severity, with other behavioral disturbance: Secondary | ICD-10-CM | POA: Diagnosis not present

## 2024-05-03 DIAGNOSIS — B20 Human immunodeficiency virus [HIV] disease: Secondary | ICD-10-CM | POA: Diagnosis not present

## 2024-05-03 NOTE — Plan of Care (Signed)
  Problem: Coping: Goal: Ability to adjust to condition or change in health will improve Outcome: Progressing   Problem: Fluid Volume: Goal: Ability to maintain a balanced intake and output will improve Outcome: Progressing   Problem: Health Behavior/Discharge Planning: Goal: Ability to identify and utilize available resources and services will improve Outcome: Progressing Goal: Ability to manage health-related needs will improve Outcome: Progressing   Problem: Metabolic: Goal: Ability to maintain appropriate glucose levels will improve Outcome: Progressing   Problem: Nutritional: Goal: Maintenance of adequate nutrition will improve Outcome: Progressing Goal: Progress toward achieving an optimal weight will improve Outcome: Progressing   Problem: Skin Integrity: Goal: Risk for impaired skin integrity will decrease Outcome: Progressing   Problem: Tissue Perfusion: Goal: Adequacy of tissue perfusion will improve Outcome: Progressing   Problem: Education: Goal: Knowledge of General Education information will improve Description: Including pain rating scale, medication(s)/side effects and non-pharmacologic comfort measures Outcome: Progressing   Problem: Health Behavior/Discharge Planning: Goal: Ability to manage health-related needs will improve Outcome: Progressing   Problem: Clinical Measurements: Goal: Ability to maintain clinical measurements within normal limits will improve Outcome: Progressing Goal: Will remain free from infection Outcome: Progressing Goal: Diagnostic test results will improve Outcome: Progressing Goal: Respiratory complications will improve Outcome: Progressing Goal: Cardiovascular complication will be avoided Outcome: Progressing   Problem: Activity: Goal: Risk for activity intolerance will decrease Outcome: Progressing   Problem: Nutrition: Goal: Adequate nutrition will be maintained Outcome: Progressing   Problem: Coping: Goal:  Level of anxiety will decrease Outcome: Progressing   Problem: Elimination: Goal: Will not experience complications related to bowel motility Outcome: Progressing Goal: Will not experience complications related to urinary retention Outcome: Progressing   Problem: Pain Management: Goal: General experience of comfort will improve Outcome: Progressing   Problem: Safety: Goal: Ability to remain free from injury will improve Outcome: Progressing   Problem: Skin Integrity: Goal: Risk for impaired skin integrity will decrease Outcome: Progressing   Problem: Activity: Goal: Ability to tolerate increased activity will improve Outcome: Progressing   Problem: Respiratory: Goal: Ability to maintain a clear airway and adequate ventilation will improve Outcome: Progressing

## 2024-05-03 NOTE — Progress Notes (Signed)
 Progress Note   Patient: Meghan Jarvis ZOX:[id number] DOB: Jun 22, 1976 DOA: 12/08/2023     147 DOS: the patient was seen and examined on 05/03/2024   Brief hospital course: HPI/Hospital Course: 47yo with h/o polysubstance (ETOH, cocaine) abuse, afib, homelessness, AIDS, hypothyroidism, and DM who presented on 12/08/23 with acute respiratory failure.  She was found down by a friend and could not wake her up.  She was found to be hypoxic in the 70s. Admitted to ICU, intubated, started on antibiotics for pneumonia.  12/31: extubated. ID consulted.  MRI brain with advanced cerebral atrophy.  Psych evaluated - "Patient is lacking and reasoning in her medical care for example she is unable to describe the consequence that she will have if she does not take her medications or has complications with her chronic conditions" and needs assistance with housing and finances due to cognitive decline.  APS/DSS pending guardianship and will need placement.  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 On Mepron  for PCP prophylaxis.   AIDS (acquired immune deficiency syndrome) (HCC) On Biktarvy . On Mepron  for PCP prophylaxis.  Lab Results  Component Value Date/Time   HIV1RNAQUANT 60 03/18/2024 05:03 AM  Last CD4 count of 03-18-2024 was 416  C/o Right lateral thigh pain and tenderness, possible IT band syndrome 5/15 Started prednisone  40 mg p.o. daily for 3 days and Voltaren  gel 3 times daily. 5/16 RLE edema, started torsemide  40 mg po daily x 5 days and  K-Dur 20 mEq p.o. daily US  venous duplex: Negative for right lower extremity DVT.  5/17 possible component of neuropathy.  Patient notes burning sensation and numbness. -Start gabapentin  in addition to other medications  Bilateral lower extremity edema.  Resolved Possibly due to HFpEF exacerbation.  Patient was  started on torsemide  and has minimal R pedal edema.  Given patient's pain in this area an x-ray was obtained of her right ankle which showed no significant ankle pathology.  S/p torsemide , and potassium supplement  Edema resolved on 5/19, changed to torsemide  20 mg p.o. daily as needed for recurrent lower extremity edema   Cocaine abuse (HCC) Advised to quit using illicit drugs.  AKI (acute kidney injury) (HCC)-resolved.  Right knee/ leg pain- continue pain control with lidocaine , tylenol .  Abdominal pain-resolved   Hypotension-resolved  Off midodrine  therapy  Diarrhea- resolved as of 03/22/2024 Likely overflow constipation causing her diarrhea.  Patient did not like lactulose  or MiraLAX .  Continue Colace.  Generalized weakness-resolved as of 03/22/2024 Physical therapy recommending rehab  Pancytopenia (HCC)- resolved. Iron  deficiency anemia, transferrin saturation 10%, started oral iron  supplement with vitamin C  Folic acid  levels within normal range Vitamin B12 level 290, goal >400.  S/p vitamin B12 1000 mcg IM injection x 1 dose followed by oral supplement.  Encounter for assessment of decision-making capacity She lacks capacity to make medical decisions. TOC attempted to pursue legal guardianship with the court system.  Obesity Class 1- BMI 31.39 Encouraged weight reduction.   Out of bed to chair. Incentive spirometry. Nursing supportive care. Fall, aspiration precautions. Diet:  Diet Orders (From admission, onward)     Start     Ordered   04/08/24 1449  DIET DYS 3 Room service appropriate? Yes; Fluid consistency: Thin; Fluid restriction: 1500 mL Fluid  Diet effective now       Question Answer Comment  Room service appropriate? Yes   Fluid consistency: Thin   Fluid restriction:  1500 mL Fluid      04/08/24 1448           DVT prophylaxis: enoxaparin  (LOVENOX ) injection 40 mg Start: 12/11/23 2200  Level of care: Med-Surg   Code Status: Full Code  Subjective:  No significant events overnight, patient did ambulate, complaining of pain in the right side of back and could not walk much.  Patient was advised to take as needed pain medications. Advised to take pain medication before starting walking.   Physical Exam: Vitals:   05/02/24 1953 05/03/24 0346 05/03/24 0815 05/03/24 1000  BP: 102/68 100/66 101/67 110/68  Pulse: (!) 103 (!) 103 98 100  Resp: 20     Temp: 97.8 F (36.6 C) 98.1 F (36.7 C) 98.2 F (36.8 C) 98 F (36.7 C)  TempSrc: Oral Oral  Oral  SpO2: 99% 100% 99% 100%  Weight:      Height:          Physical Exam  Constitutional: In no distress.  Cardiovascular: Normal rate, regular rhythm. No pedal edema Pulmonary: Non labored breathing on room air, no wheezing or rales.   Abdominal: Soft. Normal bowel sounds. Non distended and non tender Musculoskeletal: R lateral thigh TT mild palpation  Neurological: Alert and oriented to person, place, and time. Non focal  Skin: Skin is warm and dry.    Data Reviewed:      Latest Ref Rng & Units 04/29/2024    4:42 AM 04/28/2024    5:47 AM 04/26/2024    8:04 AM  CBC  WBC 4.0 - 10.5 K/uL 4.7  5.9  7.8   Hemoglobin 12.0 - 15.0 g/dL 9.9  78.2  9.3   Hematocrit 36.0 - 46.0 % 31.6  33.1  28.5   Platelets 150 - 400 K/uL 310  309  290       Latest Ref Rng & Units 04/29/2024    4:42 AM 04/28/2024    5:47 AM 04/27/2024    9:04 AM  BMP  Glucose 70 - 99 mg/dL 956  213  086   BUN 6 - 20 mg/dL 18  18  16    Creatinine 0.44 - 1.00 mg/dL 5.78  4.69  6.29   Sodium 135 - 145 mmol/L 138  140  139   Potassium 3.5 - 5.1 mmol/L 4.2  3.4  3.0   Chloride 98 - 111 mmol/L 103  103  100   CO2 22 - 32 mmol/L 28  27  31    Calcium  8.9 - 10.3 mg/dL 9.3  9.1  9.2    No results found.   Disposition: Status is: Inpatient Remains inpatient appropriate because: awaiting guardianship, placement  Planned Discharge Destination: Group home APS social worker informed to [facility] that patient may go to the facility  on Tuesday 05/06/2024      Time spent: 25 minutes  Author: Althia Atlas, MD 05/03/2024 3:31 PM Secure chat 7am to 7pm For on call review [email or web address].

## 2024-05-04 ENCOUNTER — Inpatient Hospital Stay: Payer: MEDICAID

## 2024-05-04 DIAGNOSIS — F02818 Dementia in other diseases classified elsewhere, unspecified severity, with other behavioral disturbance: Secondary | ICD-10-CM | POA: Diagnosis not present

## 2024-05-04 DIAGNOSIS — B20 Human immunodeficiency virus [HIV] disease: Secondary | ICD-10-CM | POA: Diagnosis not present

## 2024-05-04 LAB — URINALYSIS, W/ REFLEX TO CULTURE (INFECTION SUSPECTED)
Bilirubin Urine: NEGATIVE
Glucose, UA: NEGATIVE mg/dL
Hgb urine dipstick: NEGATIVE
Ketones, ur: NEGATIVE mg/dL
Nitrite: NEGATIVE
Protein, ur: NEGATIVE mg/dL
Specific Gravity, Urine: 1.031 — ABNORMAL HIGH (ref 1.005–1.030)
pH: 5 (ref 5.0–8.0)

## 2024-05-04 MED ORDER — DICLOFENAC SODIUM 1 % EX GEL
2.0000 g | Freq: Three times a day (TID) | CUTANEOUS | Status: DC
  Administered 2024-05-04 – 2024-05-07 (×9): 2 g via TOPICAL
  Filled 2024-05-04: qty 100

## 2024-05-04 MED ORDER — IOHEXOL 300 MG/ML  SOLN
80.0000 mL | Freq: Once | INTRAMUSCULAR | Status: AC | PRN
Start: 2024-05-04 — End: 2024-05-04
  Administered 2024-05-04: 80 mL via INTRAVENOUS

## 2024-05-04 NOTE — Plan of Care (Signed)
  Problem: Coping: Goal: Ability to adjust to condition or change in health will improve Outcome: Progressing   Problem: Fluid Volume: Goal: Ability to maintain a balanced intake and output will improve Outcome: Progressing   Problem: Health Behavior/Discharge Planning: Goal: Ability to identify and utilize available resources and services will improve Outcome: Progressing Goal: Ability to manage health-related needs will improve Outcome: Progressing

## 2024-05-04 NOTE — Progress Notes (Addendum)
 Progress Note   Patient: Meghan Jarvis:[id number] DOB: 01-24-1976 DOA: 12/08/2023     148 DOS: the patient was seen and examined on 05/04/2024   Brief hospital course: HPI/Hospital Course: 47yo with h/o polysubstance (ETOH, cocaine) abuse, afib, homelessness, AIDS, hypothyroidism, and DM who presented on 12/08/23 with acute respiratory failure.  She was found down by a friend and could not wake her up.  She was found to be hypoxic in the 70s. Admitted to ICU, intubated, started on antibiotics for pneumonia.  12/31: extubated. ID consulted.  MRI brain with advanced cerebral atrophy.  Psych evaluated - "Patient is lacking and reasoning in her medical care for example she is unable to describe the consequence that she will have if she does not take her medications or has complications with her chronic conditions" and needs assistance with housing and finances due to cognitive decline.  APS/DSS pending guardianship and will need placement.  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 On Mepron  for PCP prophylaxis.   AIDS (acquired immune deficiency syndrome) (HCC) On Biktarvy . On Mepron  for PCP prophylaxis.  Lab Results  Component Value Date/Time   HIV1RNAQUANT 60 03/18/2024 05:03 AM  Last CD4 count of 03-18-2024 was 416  C/o Right lateral thigh pain and tenderness, possible IT band syndrome 5/15 Started prednisone  40 mg p.o. daily for 3 days and Voltaren  gel 3 times daily. 5/16 RLE edema, started torsemide  40 mg po daily x 5 days and  K-Dur 20 mEq p.o. daily US  venous duplex: Negative for right lower extremity DVT.  5/17 possible component of neuropathy.  Patient notes burning sensation and numbness. -Start gabapentin  in addition to other medications  Bilateral lower extremity edema.  Resolved Possibly due to HFpEF exacerbation.  Patient was  started on torsemide  and has minimal R pedal edema.  Given patient's pain in this area an x-ray was obtained of her right ankle which showed no significant ankle pathology.  S/p torsemide , and potassium supplement  Edema resolved on 5/19, changed to torsemide  20 mg p.o. daily as needed for recurrent lower extremity edema  Chronic backache due to degenerative disc disease 5/25 CT LS spine: No acute finding. Lumbar spine degeneration most notably affecting the L4-5 and L5-S1 facets. Continue Tylenol  and ibuprofen  as needed Started Voltaren  gel TID  Cocaine abuse (HCC) Advised to quit using illicit drugs.  AKI (acute kidney injury) (HCC)-resolved.  Right knee/ leg pain- continue pain control with lidocaine , tylenol .  Abdominal pain-resolved   Hypotension-resolved  Off midodrine  therapy  Diarrhea- resolved as of 03/22/2024 Likely overflow constipation causing her diarrhea.  Patient did not like lactulose  or MiraLAX .  Continue Colace.  Generalized weakness-resolved as of 03/22/2024 Physical therapy recommending rehab  Pancytopenia (HCC)- resolved. Iron  deficiency anemia, transferrin saturation 10%, started oral iron  supplement with vitamin C  Folic acid  levels within normal range Vitamin B12 level 290, goal >400.  S/p vitamin B12 1000 mcg IM injection x 1 dose followed by oral supplement.  Encounter for assessment of decision-making capacity She lacks capacity to make medical decisions. TOC attempted to pursue legal guardianship with the court system.  Obesity Class 1- BMI 31.39 Encouraged weight reduction.   Out of bed to chair. Incentive spirometry. Nursing supportive care. Fall, aspiration precautions. Diet:  Diet Orders (From admission, onward)     Start     Ordered   04/08/24 1449  DIET DYS 3 Room service appropriate? Yes;  Fluid consistency: Thin; Fluid restriction: 1500 mL Fluid  Diet effective now       Question Answer Comment  Room service appropriate? Yes   Fluid  consistency: Thin   Fluid restriction: 1500 mL Fluid      04/08/24 1448           DVT prophylaxis: enoxaparin  (LOVENOX ) injection 40 mg Start: 12/11/23 2200  Level of care: Med-Surg   Code Status: Full Code  Subjective: No significant events overnight, patient still complaining of lower backache and radiating to the right leg, requesting imaging.  So CT scan was done as above.  Did not show any acute findings Patient denied any other complaints   Physical Exam: Vitals:   05/03/24 2028 05/04/24 0347 05/04/24 0500 05/04/24 0833  BP: 121/85 112/77  122/83  Pulse: (!) 110 97  99  Resp: 16 16  18   Temp: 98 F (36.7 C) 98.2 F (36.8 C)  98.2 F (36.8 C)  TempSrc: Oral Oral    SpO2: 100% 98%  100%  Weight:   100.9 kg   Height:          Physical Exam  Constitutional: In no distress.  Cardiovascular: Normal rate, regular rhythm. No pedal edema Pulmonary: Non labored breathing on room air, no wheezing or rales.   Abdominal: Soft. Normal bowel sounds. Non distended and non tender Musculoskeletal: R lateral thigh TT mild palpation  Neurological: Alert and oriented to person, place, and time. Non focal  Skin: Skin is warm and dry.    Data Reviewed:      Latest Ref Rng & Units 04/29/2024    4:42 AM 04/28/2024    5:47 AM 04/26/2024    8:04 AM  CBC  WBC 4.0 - 10.5 K/uL 4.7  5.9  7.8   Hemoglobin 12.0 - 15.0 g/dL 9.9  16.1  9.3   Hematocrit 36.0 - 46.0 % 31.6  33.1  28.5   Platelets 150 - 400 K/uL 310  309  290       Latest Ref Rng & Units 04/29/2024    4:42 AM 04/28/2024    5:47 AM 04/27/2024    9:04 AM  BMP  Glucose 70 - 99 mg/dL 096  045  409   BUN 6 - 20 mg/dL 18  18  16    Creatinine 0.44 - 1.00 mg/dL 8.11  9.14  7.82   Sodium 135 - 145 mmol/L 138  140  139   Potassium 3.5 - 5.1 mmol/L 4.2  3.4  3.0   Chloride 98 - 111 mmol/L 103  103  100   CO2 22 - 32 mmol/L 28  27  31    Calcium  8.9 - 10.3 mg/dL 9.3  9.1  9.2    CT LUMBAR SPINE W WO CONTRAST Result Date:  05/04/2024 CLINICAL DATA:  Lumbar pain.  No known injury. EXAM: CT LUMBAR SPINE WITH AND WITHOUT CONTRAST TECHNIQUE: Multidetector CT imaging of the lumbar spine was performed without and with intravenous contrast administration. Multiplanar CT image reconstructions were also generated. RADIATION DOSE REDUCTION: This exam was performed according to the departmental dose-optimization program which includes automated exposure control, adjustment of the mA and/or kV according to patient size and/or use of iterative reconstruction technique. CONTRAST:  80mL OMNIPAQUE  IOHEXOL  300 MG/ML  SOLN COMPARISON:  12/21/2023 lumbar MRI FINDINGS: Segmentation: The lowest open disc space is numbered L5-S1 based on prior numbering scheme. This places no ribs at the T12 level. Alignment: Negative for listhesis Vertebrae: No fracture  or aggressive bone lesion. Paraspinal and other soft tissues: No evidence of perispinal mass or inflammation. Disc levels: Facet osteoarthritis especially at L4-5 and L5-S1 with L4-5 joint space widening and subchondral irregularity. Active facet arthritis seen at L4-5 on prior MRI. Mild to moderate narrowing of the thecal sac at L4-5, similar to prior MRI in related to short pedicles, degeneration, and generalized expansion of the epidural fat. No herniation seen superimposed on the epidural venous plexus. IMPRESSION: No acute finding. Lumbar spine degeneration most notably affecting the L4-5 and L5-S1 facets. Electronically Signed   By: Ronnette Coke M.D.   On: 05/04/2024 12:17     Disposition: Status is: Inpatient Remains inpatient appropriate because: awaiting guardianship, placement  Planned Discharge Destination: Group home APS social worker informed to [facility] that patient may go to the facility on Tuesday 05/06/2024   Time spent: 35 minutes  Author: Althia Atlas, MD 05/04/2024 2:34 PM Secure chat 7am to 7pm For on call review [email or web address].

## 2024-05-05 DIAGNOSIS — R635 Abnormal weight gain: Secondary | ICD-10-CM | POA: Diagnosis not present

## 2024-05-05 DIAGNOSIS — M1611 Unilateral primary osteoarthritis, right hip: Secondary | ICD-10-CM | POA: Diagnosis not present

## 2024-05-05 DIAGNOSIS — B20 Human immunodeficiency virus [HIV] disease: Secondary | ICD-10-CM | POA: Diagnosis not present

## 2024-05-05 DIAGNOSIS — B589 Toxoplasmosis, unspecified: Secondary | ICD-10-CM | POA: Diagnosis not present

## 2024-05-05 DIAGNOSIS — F02818 Dementia in other diseases classified elsewhere, unspecified severity, with other behavioral disturbance: Secondary | ICD-10-CM | POA: Diagnosis not present

## 2024-05-05 MED ORDER — DOLUTEGRAVIR-LAMIVUDINE 50-300 MG PO TABS: 1.0000 | ORAL_TABLET | Freq: Every day | ORAL | Status: DC

## 2024-05-05 MED ORDER — DOLUTEGRAVIR-LAMIVUDINE 50-300 MG PO TABS
1.0000 | ORAL_TABLET | Freq: Every day | ORAL | Status: DC
  Administered 2024-05-06: 1 via ORAL
  Filled 2024-05-05 (×2): qty 1

## 2024-05-05 NOTE — Progress Notes (Signed)
 Date of Admission:  12/08/2023     ID: Meghan Jarvis is a 48 y.o. 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 Meghan Jarvis is a 48 y.o. with a history of AIDS, not compliant with meds or visits ,  , cocaine use was brought in by EMS after being found unresponsive on the couch  in an aquaintance place-   Subjective Pt is in bed says rt hip   Medications:   vitamin C   500 mg Oral Daily   atovaquone   1,500 mg Oral Q breakfast   bictegravir-emtricitabine -tenofovir  AF  1 tablet Oral Daily   vitamin B-12  1,000 mcg Oral Daily   diclofenac  Sodium  2 g Topical TID   DULoxetine   60 mg Oral Daily   enoxaparin  (LOVENOX ) injection  40 mg Subcutaneous QHS   folic acid   1 mg Oral Daily   gabapentin   300 mg Oral QHS   iron  polysaccharides  150 mg Oral Daily   lidocaine   1 patch Transdermal Q24H   lipase/protease/amylase  24,000 Units Oral TID AC   multivitamin with minerals  1 tablet Oral QHS   pantoprazole   40 mg Oral BID   senna-docusate  2 tablet Oral BID   thiamine   100 mg Oral Daily    Objective: Vital signs in last 24 hours: Patient Vitals for the past 24 hrs:  BP Temp Temp src Pulse Resp SpO2 Weight  05/05/24 0908 127/79 98.1 F (36.7 C) Oral (!) 106 20 100 % --  05/05/24 0500 -- -- -- -- -- -- 102 kg  05/05/24 0422 (!) 108/56 98.8 F (37.1 C) Oral (!) 104 20 97 % --  05/04/24 1925 116/78 98.3 F (36.8 C) Oral (!) 102 20 100 % --  Awake and alert [id number] Lungs clear to auscultation CNS grossly non focal  Examination of [facility]- restricted and painful movt rt hip  Lab Results    Latest Ref Rng & Units 04/29/2024    4:42 AM 04/28/2024    5:47 AM 04/26/2024    8:04 AM  CBC   WBC 4.0 - 10.5 K/uL 4.7  5.9  7.8   Hemoglobin 12.0 - 15.0 g/dL 9.9  16.1  9.3   Hematocrit 36.0 - 46.0 % 31.6  33.1  28.5   Platelets 150 - 400 K/uL 310  309  290        Latest Ref Rng & Units 04/29/2024    4:42 AM 04/28/2024    5:47 AM 04/27/2024    9:04 AM  CMP  Glucose 70 - 99 mg/dL 096  045  409   BUN 6 - 20 mg/dL 18  18  16    Creatinine 0.44 - 1.00 mg/dL 8.11  9.14  7.82   Sodium 135 - 145 mmol/L 138  140  139   Potassium 3.5 - 5.1 mmol/L 4.2  3.4  3.0   Chloride 98 - 111 mmol/L 103  103  100   CO2 22 - 32 mmol/L 28  27  31    Calcium  8.9 - 10.3 mg/dL 9.3  9.1  9.2       Microbiology: 12/28 BC NG  Toxo IgG > 400 Toxo PCR neg Toxo igM neg RPR NR CMV  DNA neg Crypto neg HIV RNA 2 million>> 34, 000 Cd4 is 14 ( 2.4%) repeat on 1/29 is 416 ( 23%) Beta D glucan > 500 (  repeated) Histoplasma neg Fungal antibodies negative Genosure prime- no resistant mutations QuantiFERON gold indeterminate.  No treatment needed now.  Can repeat it later.   Sed rate 35 ( 03/27/24) CRP CRP 0.7   Radiology MRI hip rt done on 02/25/24    Reviewed personally     Intramuscular edema and enhancement within the right gluteus medius and minimus muscles with a central area of non-enhancement in the posterior right gluteus minimus muscle adjacent to the posterior acetabular rim. Similar findings are present at the lateral aspect of the right gluteus maximus muscle adjacent to the greater trochanter. 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 No fluid signal at these locations to suggest abscess formation.   Assessment/Plan:  AIDS- - not been in care since 2021-  VL  on admission was 2 million and cd4 is 11  Started Biktarvy  on 12/14/23 - because of 100 pound weight gain change biktarvy  to Dovato ( 3tc ( 300mg ) + doultegravir ( 50mg )).  Genotype no resistance to NRTI, NNRTI, Integrase inhibitor and PI Latest VL from 4/28 is 50  and CD4 is 334 ( 20%)  from 03/18/24 Continue atovaquone  for toxo  prophylaxis   Toxo IgG very high-  MRI brain no CNS lesions  . TOXO PCR negative On  PCP/TOXO  and MAI prophylaxis. Was On bactrim  for PCP and Toxo prophylaxis- because of new rash concerning for bactrim  allergy was changed to atovaquone  Significant weight gain  On 12/09/23 she was 121 pounds On 05/05/24 she weighs 224 pounds 100 pounds weight gain in 5 months Contributing factoirs- HIV meds,  SSRI , Abstinence from cocaine, Not being homeless, too much snacking  I will  have to change her current HAART  ( Biktarvy ) which can cause weight gain Will change to Dovato ( 3TC+ dolutegravir) and see whether it helps with weight reduction    Persistent Rt thigh, rt hip pain- this is now made worse with 100 pound weight gain MRI - rt hip Discussed with [facility] radiologist -moderate Osteoarthritis of rt hip.  Small muscle tear vs inflammation rt glutues  She was assesed by ortho and no intervention recommended Symptomatic management Weight reduction   Encephalopathy  has resolved ( Likely related to cocaine use on presentation)  Aspiration leading to acute hypoxic respiratory failure with right middle lobe and lower lobe consolidation in Dec 2024 .  Status post intubation and was mechanically ventilated in the early part of the admission.  treated     Rash- resolved likely was due to bactrim - changed to Atovaquone - Mepron       Pruritus with excoriations and hyperpigmentiaon resolved ? Bactrim  related rash on baseline dry and excoriated skin    No evidence of IRIS    Rash resolved  Beta D glucan high > 500 (fungal antibodies negative-  Normalized on repeat testing    Active cocaine use  ETOH abuse  Anemia   leucopenia/thrombocytopenia could be due to AIDS ETOH abuse   resolved  CMV DNA neg , AFB blood-No growth     HIV associated neurocognitive disorder compounded by substance use with no insight in her medical condition    She has no capacity , and waiting for placement- DSS involved   Discussed the management with patient and Dr.Kumar  IF pt is discharged then she will go  on Dovato and atovaquone  I will follow her as OP-appt given for her in Aug 2025

## 2024-05-05 NOTE — Progress Notes (Signed)
 Progress Note   Patient: Meghan Jarvis:[id number] DOB: [birth year] DOA: 12/08/2023     149 DOS: the patient was seen and examined on 05/05/2024   Brief hospital course: HPI/Hospital Course: 47yo with h/o polysubstance (ETOH, cocaine) abuse, afib, homelessness, AIDS, hypothyroidism, and DM who presented on 12/08/23 with acute respiratory failure.  She was found down by a friend and could not wake her up.  She was found to be hypoxic in the 70s. Admitted to ICU, intubated, started on antibiotics for pneumonia.  12/31: extubated. ID consulted.  MRI brain with advanced cerebral atrophy.  Psych evaluated - "Patient is lacking and reasoning in her medical care for example she is unable to describe the consequence that she will have if she does not take her medications or has complications with her chronic conditions" and needs assistance with housing and finances due to cognitive decline.  APS/DSS pending guardianship and will need placement.  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 On Mepron  for PCP prophylaxis.   AIDS (acquired immune deficiency syndrome) (HCC) On Biktarvy . On Mepron  for PCP prophylaxis.  Lab Results  Component Value Date/Time   HIV1RNAQUANT 60 03/18/2024 05:03 AM  Last CD4 count of 03-18-2024 was 416  C/o Right lateral thigh pain and tenderness, possible IT band syndrome 5/15 Started prednisone  40 mg p.o. daily for 3 days and Voltaren  gel 3 times daily. 5/16 RLE edema, started torsemide  40 mg po daily x 5 days and  K-Dur 20 mEq p.o. daily US  venous duplex: Negative for right lower extremity DVT.  5/17 possible component of neuropathy.  Patient notes burning sensation and numbness. -Start gabapentin  in addition to other medications  Bilateral lower extremity edema.  Resolved Possibly due to HFpEF exacerbation.  Patient was  started on torsemide  and has minimal R pedal edema.  Given patient's pain in this area an x-ray was obtained of her right ankle which showed no significant ankle pathology.  S/p torsemide , and potassium supplement  Edema resolved on 5/19, changed to torsemide  20 mg p.o. daily as needed for recurrent lower extremity edema  Chronic backache due to degenerative disc disease 5/25 CT LS spine: No acute finding. Lumbar spine degeneration most notably affecting the L4-5 and L5-S1 facets. Continue Tylenol  and ibuprofen  as needed Started Voltaren  gel TID and advised apply heat pack  Cocaine abuse (HCC) Advised to quit using illicit drugs.  AKI (acute kidney injury) (HCC)-resolved.  Right knee/ leg pain- continue pain control with lidocaine , tylenol .  Abdominal pain-resolved   Hypotension-resolved  Off midodrine  therapy  Diarrhea- resolved as of 03/22/2024 Likely overflow constipation causing her diarrhea.  Patient did not like lactulose  or MiraLAX .  Continue Colace.  Generalized weakness-resolved as of 03/22/2024 Physical therapy recommending rehab  Pancytopenia (HCC)- resolved. Iron  deficiency anemia, transferrin saturation 10%, started oral iron  supplement with vitamin C  Folic acid  levels within normal range Vitamin B12 level 290, goal >400.  S/p vitamin B12 1000 mcg IM injection x 1 dose followed by oral supplement.  Encounter for assessment of decision-making capacity She lacks capacity to make medical decisions. TOC attempted to pursue legal guardianship with the court system.  Obesity Class 1- BMI 31.39 Encouraged weight reduction.   Out of bed to chair. Incentive spirometry. Nursing supportive care. Fall, aspiration precautions. Diet:  Diet Orders (From admission, onward)     Start     Ordered   04/08/24 1449  DIET DYS  3 Room service appropriate? Yes; Fluid consistency: Thin; Fluid restriction: 1500 mL Fluid  Diet effective now       Question Answer Comment  Room service  appropriate? Yes   Fluid consistency: Thin   Fluid restriction: 1500 mL Fluid      04/08/24 1448           DVT prophylaxis: enoxaparin  (LOVENOX ) injection 40 mg Start: 12/11/23 2200  Level of care: Med-Surg   Code Status: Full Code  Subjective: No significant events overnight, patient still has chronic backache, advised to continue as needed pain medications, Voltaren  gel and apply heat pack.   Physical Exam: Vitals:   05/04/24 1925 05/05/24 0422 05/05/24 0500 05/05/24 0908  BP: 116/78 (!) 108/56  127/79  Pulse: (!) 102 (!) 104  (!) 106  Resp: 20 20  20   Temp: 98.3 F (36.8 C) 98.8 F (37.1 C)  98.1 F (36.7 C)  TempSrc: Oral Oral  Oral  SpO2: 100% 97%  100%  Weight:   102 kg   Height:          Physical Exam  Constitutional: In no distress.  Cardiovascular: Normal rate, regular rhythm. No pedal edema Pulmonary: Non labored breathing on room air, no wheezing or rales.   Abdominal: Soft. Normal bowel sounds. Non distended and non tender Musculoskeletal: R lateral thigh TT mild palpation  Neurological: Alert and oriented to person, place, and time. Non focal  Skin: Skin is warm and dry.    Data Reviewed:      Latest Ref Rng & Units 04/29/2024    4:42 AM 04/28/2024    5:47 AM 04/26/2024    8:04 AM  CBC  WBC 4.0 - 10.5 K/uL 4.7  5.9  7.8   Hemoglobin 12.0 - 15.0 g/dL 9.9  09.8  9.3   Hematocrit 36.0 - 46.0 % 31.6  33.1  28.5   Platelets 150 - 400 K/uL 310  309  290       Latest Ref Rng & Units 04/29/2024    4:42 AM 04/28/2024    5:47 AM 04/27/2024    9:04 AM  BMP  Glucose 70 - 99 mg/dL 119  147  829   BUN 6 - 20 mg/dL 18  18  16    Creatinine 0.44 - 1.00 mg/dL 5.62  1.30  8.65   Sodium 135 - 145 mmol/L 138  140  139   Potassium 3.5 - 5.1 mmol/L 4.2  3.4  3.0   Chloride 98 - 111 mmol/L 103  103  100   CO2 22 - 32 mmol/L 28  27  31    Calcium  8.9 - 10.3 mg/dL 9.3  9.1  9.2    CT LUMBAR SPINE W WO CONTRAST Result Date: 05/04/2024 CLINICAL DATA:  Lumbar  pain.  No known injury. EXAM: CT LUMBAR SPINE WITH AND WITHOUT CONTRAST TECHNIQUE: Multidetector CT imaging of the lumbar spine was performed without and with intravenous contrast administration. Multiplanar CT image reconstructions were also generated. RADIATION DOSE REDUCTION: This exam was performed according to the departmental dose-optimization program which includes automated exposure control, adjustment of the mA and/or kV according to patient size and/or use of iterative reconstruction technique. CONTRAST:  80mL OMNIPAQUE  IOHEXOL  300 MG/ML  SOLN COMPARISON:  12/21/2023 lumbar MRI FINDINGS: Segmentation: The lowest open disc space is numbered L5-S1 based on prior numbering scheme. This places no ribs at the T12 level. Alignment: Negative for listhesis Vertebrae: No fracture or aggressive bone lesion. Paraspinal and other soft  tissues: No evidence of perispinal mass or inflammation. Disc levels: Facet osteoarthritis especially at L4-5 and L5-S1 with L4-5 joint space widening and subchondral irregularity. Active facet arthritis seen at L4-5 on prior MRI. Mild to moderate narrowing of the thecal sac at L4-5, similar to prior MRI in related to short pedicles, degeneration, and generalized expansion of the epidural fat. No herniation seen superimposed on the epidural venous plexus. IMPRESSION: No acute finding. Lumbar spine degeneration most notably affecting the L4-5 and L5-S1 facets. Electronically Signed   By: Ronnette Coke M.D.   On: 05/04/2024 12:17     Disposition: Status is: Inpatient Remains inpatient appropriate because: awaiting guardianship, placement  Planned Discharge Destination: Group home APS social worker informed to [facility] that patient may go to the facility on Tuesday 05/06/2024   Time spent: 35 minutes  Author: Althia Atlas, MD 05/05/2024 3:10 PM Secure chat 7am to 7pm For on call review [email or web address].

## 2024-05-05 NOTE — TOC Progression Note (Signed)
 Transition of Care [facility]) - Progression Note    Patient Details  Name: Meghan Jarvis MRN: [id number] Date of Birth: 1976/01/03  Transition of [facility] CM/SW Contact  Loman Risk, RN Phone Number: 05/05/2024, 2:16 PM  Clinical Narrative:     Message sent to [facility], owner of group home to determine if plan is still to admit patient tomorrow.  Awaiting a response   Expected Discharge Plan: Group Home    Expected Discharge Plan and Services         Expected Discharge Date: 01/10/24                                     Social Determinants of Health (SDOH) Interventions SDOH Screenings   Food Insecurity: Patient Unable To Answer (12/09/2023)  Recent Concern: Food Insecurity - Food Insecurity Present (09/26/2023)  Housing: Patient Unable To Answer (12/09/2023)  Recent Concern: Housing - Medium Risk (09/26/2023)  Transportation Needs: Patient Unable To Answer (12/09/2023)  Recent Concern: Transportation Needs - Unmet Transportation Needs (10/11/2023)  Utilities: Patient Unable To Answer (12/09/2023)  Recent Concern: Utilities - At Risk (09/25/2023)  Tobacco Use: High Risk (12/08/2023)    Readmission Risk Interventions     No data to display

## 2024-05-05 NOTE — Plan of Care (Signed)
   Problem: Fluid Volume: Goal: Ability to maintain a balanced intake and output will improve Outcome: Progressing   Problem: Nutritional: Goal: Maintenance of adequate nutrition will improve Outcome: Progressing

## 2024-05-06 ENCOUNTER — Other Ambulatory Visit: Payer: Self-pay

## 2024-05-06 DIAGNOSIS — B20 Human immunodeficiency virus [HIV] disease: Secondary | ICD-10-CM | POA: Diagnosis not present

## 2024-05-06 DIAGNOSIS — F02818 Dementia in other diseases classified elsewhere, unspecified severity, with other behavioral disturbance: Secondary | ICD-10-CM | POA: Diagnosis not present

## 2024-05-06 MED ORDER — TORSEMIDE 20 MG PO TABS: 20.0000 mg | ORAL_TABLET | ORAL | Status: DC

## 2024-05-06 MED ORDER — POTASSIUM CHLORIDE CRYS ER 20 MEQ PO TBCR
40.0000 meq | EXTENDED_RELEASE_TABLET | Freq: Once | ORAL | Status: AC
  Administered 2024-05-06: 40 meq via ORAL
  Filled 2024-05-06: qty 2

## 2024-05-06 MED ORDER — POTASSIUM CHLORIDE CRYS ER 20 MEQ PO TBCR: 20.0000 meq | EXTENDED_RELEASE_TABLET | Freq: Every day | ORAL | Status: DC

## 2024-05-06 MED ORDER — TORSEMIDE 20 MG PO TABS
40.0000 mg | ORAL_TABLET | Freq: Once | ORAL | Status: AC
  Administered 2024-05-06: 40 mg via ORAL
  Filled 2024-05-06: qty 2

## 2024-05-06 MED ORDER — POTASSIUM CHLORIDE CRYS ER 20 MEQ PO TBCR: 20.0000 meq | EXTENDED_RELEASE_TABLET | ORAL | Status: DC

## 2024-05-06 NOTE — Progress Notes (Signed)
 Patient would like her aunt to be called when she gets discharged and let her know where she is going.  Meghan Jarvis [phone]

## 2024-05-06 NOTE — TOC Progression Note (Signed)
 Transition of Care [facility]) - Progression Note    Patient Details  Name: MAKALA FETTEROLF MRN: [id number] Date of Birth: 02/08/1976  Transition of Care [facility]) CM/SW Contact  Loman Risk, RN Phone Number: 05/06/2024, 9:33 AM  Clinical Narrative:     -Message left for Katherina Pan requesting update - Spoke with Sentrell with Dss.  She states that she anticipates transition tomorrow, as they are still awaiting patient's check to provide to [facility] prior to admission.  Sentrelll states that she plans to transport patient at discharge   Expected Discharge Plan: Group Home    Expected Discharge Plan and Services         Expected Discharge Date: 01/10/24                                     Social Determinants of Health (SDOH) Interventions SDOH Screenings   Food Insecurity: Patient Unable To Answer (12/09/2023)  Recent Concern: Food Insecurity - Food Insecurity Present (09/26/2023)  Housing: Patient Unable To Answer (12/09/2023)  Recent Concern: Housing - Medium Risk (09/26/2023)  Transportation Needs: Patient Unable To Answer (12/09/2023)  Recent Concern: Transportation Needs - Unmet Transportation Needs (10/11/2023)  Utilities: Patient Unable To Answer (12/09/2023)  Recent Concern: Utilities - At Risk (09/25/2023)  Tobacco Use: High Risk (12/08/2023)    Readmission Risk Interventions     No data to display

## 2024-05-06 NOTE — Progress Notes (Signed)
 Progress Note   Patient: Meghan Jarvis NFA:[id number] DOB: October 01, 1976 DOA: 12/08/2023     150 DOS: the patient was seen and examined on 05/06/2024   Brief hospital course: HPI/Hospital Course: 48yo with h/o polysubstance (ETOH, cocaine) abuse, afib, homelessness, AIDS, hypothyroidism, and DM who presented on 12/08/23 with acute respiratory failure.  She was found down by a friend and could not wake her up.  She was found to be hypoxic in the 70s. Admitted to ICU, intubated, started on antibiotics for pneumonia.  12/31: extubated. ID consulted.  MRI brain with advanced cerebral atrophy.  Psych evaluated - "Patient is lacking and reasoning in her medical care for example she is unable to describe the consequence that she will have if she does not take her medications or has complications with her chronic conditions" and needs assistance with housing and finances due to cognitive decline.  APS/DSS pending guardianship and will need placement.  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 On Mepron  for PCP prophylaxis.   AIDS (acquired immune deficiency syndrome) (HCC) On Mepron  for PCP prophylaxis. S/p Biktarvy , d/c'd on 5/27 due to wt gain and started Dovanto as per ID  Nutritionist consulted, patient was advised for calorie restricted diet and lose body weight.  Continue to ambulate.  Lab Results  Component Value Date/Time   HIV1RNAQUANT 60 03/18/2024 05:03 AM  Last CD4 count of 03-18-2024 was 416  C/o Right lateral thigh pain and tenderness, possible IT band syndrome 5/15 Started prednisone  40 mg p.o. daily for 3 days and Voltaren  gel 3 times daily. 5/16 RLE edema, started torsemide  40 mg po daily x 5 days and  K-Dur 20 mEq p.o. daily US  venous duplex: Negative for right lower extremity DVT.  5/17 possible component of neuropathy.  Patient notes  burning sensation and numbness. -Start gabapentin  in addition to other medications  Bilateral lower extremity edema.  Resolved Possibly due to HFpEF exacerbation.  Patient was started on torsemide  and has minimal R pedal edema.  Given patient's pain in this area an x-ray was obtained of her right ankle which showed no significant ankle pathology.  S/p torsemide , and potassium supplement  Edema resolved on 5/19, changed to torsemide  20 mg p.o. daily as needed for recurrent lower extremity edema 5/27 recurrent RLE edema, torsemide  40 mg x 1 dose and Kdur 40 x 1,  and started Torsemide  20 mg p.o. MWF with K-Dur 20 mEq   Chronic backache due to degenerative disc disease 5/25 CT LS spine: No acute finding. Lumbar spine degeneration most notably affecting the L4-5 and L5-S1 facets. Continue Tylenol  and ibuprofen  as needed Started Voltaren  gel TID and advised apply heat pack  Cocaine abuse (HCC) Advised to quit using illicit drugs.  AKI (acute kidney injury) (HCC)-resolved.  Right knee/ leg pain- continue pain control with lidocaine , tylenol .  Abdominal pain-resolved   Hypotension-resolved  Off midodrine  therapy  Diarrhea- resolved as of 03/22/2024 Likely overflow constipation causing her diarrhea.  Patient did not like lactulose  or MiraLAX .  Continue Colace.  Generalized weakness-resolved as of 03/22/2024 Physical therapy recommending rehab  Pancytopenia (HCC)- resolved. Iron  deficiency anemia, transferrin saturation 10%, started oral iron  supplement with vitamin C  Folic acid  levels within normal range Vitamin B12 level 290, goal >400.  S/p vitamin B12 1000 mcg IM injection x 1 dose followed by oral supplement.  Encounter for assessment of decision-making capacity She lacks capacity to make medical  decisions. TOC attempted to pursue legal guardianship with the court system.  Obesity Class 1- BMI 31.39 Encouraged weight reduction.   Out of bed to chair. Incentive  spirometry. Nursing supportive care. Fall, aspiration precautions. Diet:  Diet Orders (From admission, onward)     Start     Ordered   04/08/24 1449  DIET DYS 3 Room service appropriate? Yes; Fluid consistency: Thin; Fluid restriction: 1500 mL Fluid  Diet effective now       Question Answer Comment  Room service appropriate? Yes   Fluid consistency: Thin   Fluid restriction: 1500 mL Fluid      04/08/24 1448           DVT prophylaxis: enoxaparin  (LOVENOX ) injection 40 mg Start: 12/11/23 2200  Level of care: Med-Surg   Code Status: Full Code  Subjective: No significant events overnight, patient still has chronic lower backache.  Diet restriction was advised due to weight gain and informed regarding change of medications.   Physical Exam: Vitals:   05/05/24 1940 05/06/24 0315 05/06/24 0445 05/06/24 0806  BP: 107/72 95/66  121/83  Pulse: (!) 105 (!) 102 99 93  Resp: 20 20  17   Temp: 98.5 F (36.9 C) 98.2 F (36.8 C)  (!) 97.4 F (36.3 C)  TempSrc: Oral Oral    SpO2: 100% 100%  100%  Weight:  102.4 kg    Height:          Physical Exam  Constitutional: In no distress.  Cardiovascular: Normal rate, regular rhythm. No pedal edema Pulmonary: Non labored breathing on room air, no wheezing or rales.   Abdominal: Soft. Normal bowel sounds. Non distended and non tender Musculoskeletal: R lateral thigh TT mild palpation  Neurological: Alert and oriented to person, place, and time. Non focal  Skin: Skin is warm and dry.    Data Reviewed:      Latest Ref Rng & Units 04/29/2024    4:42 AM 04/28/2024    5:47 AM 04/26/2024    8:04 AM  CBC  WBC 4.0 - 10.5 K/uL 4.7  5.9  7.8   Hemoglobin 12.0 - 15.0 g/dL 9.9  82.9  9.3   Hematocrit 36.0 - 46.0 % 31.6  33.1  28.5   Platelets 150 - 400 K/uL 310  309  290       Latest Ref Rng & Units 04/29/2024    4:42 AM 04/28/2024    5:47 AM 04/27/2024    9:04 AM  BMP  Glucose 70 - 99 mg/dL 562  130  865   BUN 6 - 20 mg/dL 18  18  16     Creatinine 0.44 - 1.00 mg/dL 7.84  6.96  2.95   Sodium 135 - 145 mmol/L 138  140  139   Potassium 3.5 - 5.1 mmol/L 4.2  3.4  3.0   Chloride 98 - 111 mmol/L 103  103  100   CO2 22 - 32 mmol/L 28  27  31    Calcium  8.9 - 10.3 mg/dL 9.3  9.1  9.2    No results found.    Disposition: Status is: Inpatient Remains inpatient appropriate because: awaiting guardianship, placement  Planned Discharge Destination: Group home APS social worker informed to [facility] that patient may go to the facility on Tuesday 05/06/2024   Time spent: 35 minutes  Author: Althia Atlas, MD 05/06/2024 3:59 PM Secure chat 7am to 7pm For on call review [email or web address].

## 2024-05-07 ENCOUNTER — Other Ambulatory Visit: Payer: Self-pay

## 2024-05-07 DIAGNOSIS — F02818 Dementia in other diseases classified elsewhere, unspecified severity, with other behavioral disturbance: Secondary | ICD-10-CM | POA: Diagnosis not present

## 2024-05-07 DIAGNOSIS — B20 Human immunodeficiency virus [HIV] disease: Secondary | ICD-10-CM | POA: Diagnosis not present

## 2024-05-07 LAB — LIPID PANEL
Cholesterol: 198 mg/dL (ref 0–200)
HDL: 58 mg/dL (ref >40)
LDL Cholesterol: 123 mg/dL — ABNORMAL HIGH (ref 0–99)
Total CHOL/HDL Ratio: 3.4 ratio
Triglycerides: 84 mg/dL (ref <150)
VLDL: 17 mg/dL (ref 0–40)

## 2024-05-07 LAB — CBC
HCT: 27.9 % — ABNORMAL LOW (ref 36.0–46.0)
Hemoglobin: 9 g/dL — ABNORMAL LOW (ref 12.0–15.0)
MCH: 26 pg (ref 26.0–34.0)
MCHC: 32.3 g/dL (ref 30.0–36.0)
MCV: 80.6 fL (ref 80.0–100.0)
Platelets: 279 10*3/uL (ref 150–400)
RBC: 3.46 MIL/uL — ABNORMAL LOW (ref 3.87–5.11)
RDW: 15 % (ref 11.5–15.5)
WBC: 5.2 10*3/uL (ref 4.0–10.5)
nRBC: 0 % (ref 0.0–0.2)

## 2024-05-07 LAB — HEPATIC FUNCTION PANEL
ALT: 16 U/L (ref 0–44)
AST: 22 U/L (ref 15–41)
Albumin: 3 g/dL — ABNORMAL LOW (ref 3.5–5.0)
Alkaline Phosphatase: 60 U/L (ref 38–126)
Bilirubin, Direct: <0.1 mg/dL (ref 0.0–0.2)
Total Bilirubin: 0.4 mg/dL (ref 0.0–1.2)
Total Protein: 7.1 g/dL (ref 6.5–8.1)

## 2024-05-07 LAB — BASIC METABOLIC PANEL WITH GFR
Anion gap: 10 (ref 5–15)
BUN: 8 mg/dL (ref 6–20)
CO2: 28 mmol/L (ref 22–32)
Calcium: 9.1 mg/dL (ref 8.9–10.3)
Chloride: 104 mmol/L (ref 98–111)
Creatinine, Ser: 0.81 mg/dL (ref 0.44–1.00)
GFR, Estimated: >60 mL/min (ref >60)
Glucose, Bld: 132 mg/dL — ABNORMAL HIGH (ref 70–99)
Potassium: 3.6 mmol/L (ref 3.5–5.1)
Sodium: 142 mmol/L (ref 135–145)

## 2024-05-07 LAB — MAGNESIUM: Magnesium: 1.6 mg/dL — ABNORMAL LOW (ref 1.7–2.4)

## 2024-05-07 LAB — PHOSPHORUS: Phosphorus: 5.3 mg/dL — ABNORMAL HIGH (ref 2.5–4.6)

## 2024-05-07 MED ORDER — DICLOFENAC SODIUM 1 % EX GEL
2.0000 g | Freq: Three times a day (TID) | CUTANEOUS | 0 refills | Status: AC | PRN
  Filled 2024-05-07: qty 100, 30d supply, fill #0

## 2024-05-07 MED ORDER — ASCORBIC ACID 500 MG PO TABS
500.0000 mg | ORAL_TABLET | Freq: Every day | ORAL | 2 refills | Status: DC
Start: 2024-05-08 — End: 2024-07-15
  Filled 2024-05-07: qty 30, 30d supply, fill #0

## 2024-05-07 MED ORDER — POLYETHYLENE GLYCOL 3350 17 GM/SCOOP PO POWD
17.0000 g | Freq: Every day | ORAL | 0 refills | Status: AC | PRN
  Filled 2024-05-07: qty 238, 14d supply, fill #0

## 2024-05-07 MED ORDER — POLYSACCHARIDE IRON COMPLEX 150 MG PO CAPS
150.0000 mg | ORAL_CAPSULE | Freq: Every day | ORAL | 2 refills | Status: DC
  Filled 2024-05-07: qty 30, 30d supply, fill #0

## 2024-05-07 MED ORDER — MAGNESIUM OXIDE 400 MG PO TABS
400.0000 mg | ORAL_TABLET | Freq: Two times a day (BID) | ORAL | 0 refills | Status: AC
  Filled 2024-05-07: qty 14, 7d supply, fill #0

## 2024-05-07 MED ORDER — IBUPROFEN 400 MG PO TABS
400.0000 mg | ORAL_TABLET | Freq: Three times a day (TID) | ORAL | 0 refills | Status: AC | PRN
  Filled 2024-05-07: qty 30, 10d supply, fill #0

## 2024-05-07 MED ORDER — DOLUTEGRAVIR-LAMIVUDINE 50-300 MG PO TABS
1.0000 | ORAL_TABLET | Freq: Every day | ORAL | 11 refills | Status: AC
  Filled 2024-05-07: qty 30, 30d supply, fill #0

## 2024-05-07 MED ORDER — ATOVAQUONE 750 MG/5ML PO SUSP
1500.0000 mg | Freq: Every day | ORAL | 11 refills | Status: AC
  Filled 2024-05-07: qty 300, 30d supply, fill #0

## 2024-05-07 MED ORDER — PANTOPRAZOLE SODIUM 40 MG PO TBEC
40.0000 mg | DELAYED_RELEASE_TABLET | Freq: Every day | ORAL | 2 refills | Status: AC
  Filled 2024-05-07: qty 30, 30d supply, fill #0

## 2024-05-07 MED ORDER — TORSEMIDE 40 MG PO TABS
40.0000 mg | ORAL_TABLET | Freq: Every day | ORAL | 0 refills | Status: DC
  Filled 2024-05-07: qty 3, 3d supply, fill #0

## 2024-05-07 MED ORDER — TORSEMIDE 20 MG PO TABS
20.0000 mg | ORAL_TABLET | ORAL | 0 refills | Status: AC
  Filled 2024-05-07: qty 30, 66d supply, fill #0

## 2024-05-07 MED ORDER — CYANOCOBALAMIN 1000 MCG PO TABS
1000.0000 ug | ORAL_TABLET | Freq: Every day | ORAL | 2 refills | Status: AC
  Filled 2024-05-07: qty 30, 30d supply, fill #0

## 2024-05-07 MED ORDER — ACETAMINOPHEN 325 MG PO TABS
650.0000 mg | ORAL_TABLET | Freq: Four times a day (QID) | ORAL | 2 refills | Status: DC | PRN
  Filled 2024-05-07: qty 30, 4d supply, fill #0

## 2024-05-07 MED ORDER — DULCOLAX 5 MG PO TBEC
10.0000 mg | DELAYED_RELEASE_TABLET | Freq: Every day | ORAL | 0 refills | Status: AC | PRN
  Filled 2024-05-07: qty 30, 15d supply, fill #0

## 2024-05-07 MED ORDER — DULOXETINE HCL 60 MG PO CPEP
60.0000 mg | ORAL_CAPSULE | Freq: Every day | ORAL | 0 refills | Status: AC
  Filled 2024-05-07: qty 30, 30d supply, fill #0

## 2024-05-07 MED ORDER — POTASSIUM CHLORIDE CRYS ER 20 MEQ PO TBCR
20.0000 meq | EXTENDED_RELEASE_TABLET | ORAL | 0 refills | Status: AC
  Filled 2024-05-07: qty 30, 66d supply, fill #0

## 2024-05-07 MED ORDER — HYDROXYZINE HCL 25 MG PO TABS
25.0000 mg | ORAL_TABLET | Freq: Three times a day (TID) | ORAL | 0 refills | Status: AC | PRN
  Filled 2024-05-07: qty 30, 10d supply, fill #0

## 2024-05-07 MED ORDER — MIDODRINE HCL 5 MG PO TABS
10.0000 mg | ORAL_TABLET | Freq: Three times a day (TID) | ORAL | 0 refills | Status: DC | PRN
  Filled 2024-05-07: qty 30, 5d supply, fill #0

## 2024-05-07 MED ORDER — GABAPENTIN 300 MG PO CAPS
300.0000 mg | ORAL_CAPSULE | Freq: Every day | ORAL | 0 refills | Status: AC
  Filled 2024-05-07: qty 30, 30d supply, fill #0

## 2024-05-07 MED ORDER — MAGNESIUM OXIDE -MG SUPPLEMENT 400 (240 MG) MG PO TABS
400.0000 mg | ORAL_TABLET | Freq: Two times a day (BID) | ORAL | Status: DC
  Administered 2024-05-07: 400 mg via ORAL
  Filled 2024-05-07: qty 1

## 2024-05-07 MED ORDER — POTASSIUM CHLORIDE CRYS ER 20 MEQ PO TBCR
40.0000 meq | EXTENDED_RELEASE_TABLET | Freq: Every day | ORAL | 0 refills | Status: DC
  Filled 2024-05-07: qty 6, 3d supply, fill #0

## 2024-05-07 NOTE — TOC Transition Note (Signed)
 Transition of Care [facility]) - Discharge Note   Patient Details  Name: Meghan Jarvis MRN: [id number] Date of Birth: 12-21-1975  [facility] CM/SW Contact:  Loman Risk, RN Phone Number: 05/07/2024, 2:31 PM   Clinical Narrative:      Patient to discharge to [facility] care home today - Meds to bed delivered - RW delivered to room by adapt - Bedside RN called report to Carlon Chester at care home - [place] with DSS to transport at discharge - [facility] Blowers with Cheryll Corti notified  -fl2 and dc summary printed and placed in DC packet per [facility] request        Patient Goals and CMS Choice            Discharge Placement                       Discharge Plan and Services Additional resources added to the After Visit Summary for                                       Social Drivers of Health (SDOH) Interventions SDOH Screenings   Food Insecurity: Patient Unable To Answer (12/09/2023)  Recent Concern: Food Insecurity - Food Insecurity Present (09/26/2023)  Housing: Patient Unable To Answer (12/09/2023)  Recent Concern: Housing - Medium Risk (09/26/2023)  Transportation Needs: Patient Unable To Answer (12/09/2023)  Recent Concern: Transportation Needs - Unmet Transportation Needs (10/11/2023)  Utilities: Patient Unable To Answer (12/09/2023)  Recent Concern: Utilities - At Risk (09/25/2023)  Tobacco Use: High Risk (12/08/2023)     Readmission Risk Interventions     No data to display

## 2024-05-07 NOTE — NC FL2 (Signed)
 [place]  MEDICAID FL2 LEVEL OF CARE FORM     IDENTIFICATION  Patient Name: Meghan Jarvis Birthdate: 1976/01/31 Sex: female Admission Date (Current Location): 12/08/2023  [place] and [place] Number:  Chiropodist and Address:  [facility], [address], [place], [place] [address]      Provider Number: [id number]  Attending Physician Name and Address:  Althia Atlas, MD  Relative Name and Phone Number:  nipes,meredith Belvia Boyer)  [phone]    Current Level of Care: Hospital Recommended Level of Care: [facility] Prior Approval Number:    Date Approved/Denied:   PASRR Number: [id number]  Discharge Plan: Other (Comment) (care home)    Current Diagnoses: 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) 12/12/2023   Protein calorie malnutrition (HCC) 12/10/2023   Substance induced mood disorder (HCC) 11/30/2023   Crack cocaine use 11/30/2023   Homeless 11/28/2023   HIV (human immunodeficiency virus infection) (HCC)    Alcohol use disorder, severe, dependence (HCC) 05/18/2016    Orientation RESPIRATION BLADDER Height & Weight     Self, Time, Situation, Place  Normal Continent Weight: 100.2 kg Height:  5' 7.99" (172.7 cm)  BEHAVIORAL SYMPTOMS/MOOD NEUROLOGICAL BOWEL NUTRITION STATUS   (None)  (None) Continent Diet (low fat)  AMBULATORY STATUS COMMUNICATION OF NEEDS Skin   Supervision Verbally Skin abrasions                       Personal Care Assistance Level of Assistance  Bathing, Feeding, Dressing Bathing Assistance: Limited assistance Feeding  assistance: Independent Dressing Assistance: Independent     Functional Limitations Info  Sight, Hearing, Speech Sight Info: Adequate Hearing Info: Adequate Speech Info: Adequate    SPECIAL CARE FACTORS FREQUENCY  PT (By licensed PT), OT (By licensed OT)     PT Frequency: 3 times a week OT Frequency: 3 times a week            Contractures Contractures Info: Not present    Additional Factors Info  Code Status, Allergies Code Status Info: full Allergies Info: Fish Allergy, Shellfish Allergy, Buprenorphine Hcl, Morphine  And Codeine, Sulfa  Antibiotics     Isolation Precautions Info: Contact precautions     Medication List       STOP taking these medications     Biktarvy  50-200-25 MG Tabs tablet Generic drug: bictegravir-emtricitabine -tenofovir  AF    cephALEXin  500 MG capsule Commonly known as: KEFLEX            TAKE these medications     acetaminophen  325 MG tablet Commonly known as: TYLENOL  Take 2 tablets (650 mg total) by mouth every 6 (six) hours as needed for mild pain (pain score 1-3), fever, headache or moderate pain (pain score 4-6).    ascorbic acid  500 MG tablet Commonly known as: VITAMIN C  Take 1 tablet (500 mg total) by mouth daily. Start taking on: May 08, 2024    atovaquone  750 MG/5ML suspension Commonly known as: MEPRON  Take 10 mLs (1,500 mg total) by mouth daily with breakfast.    cyanocobalamin 1000 MCG tablet Take 1 tablet (1,000 mcg total) by mouth daily. Start taking on: May 08, 2024  diclofenac  Sodium 1 % Gel Commonly known as: VOLTAREN  Apply 2 g topically 3 (three) times daily as needed (pain in right hip/thigh and lower back).    dolutegravir-lamiVUDine 50-300 MG tablet Commonly known as: DOVATO Take 1 tablet by mouth daily.    Dulcolax 5 MG EC tablet Generic drug: bisacodyl Take 2 tablets (10 mg total) by mouth daily as needed for severe constipation.    DULoxetine  60 MG capsule Commonly known as: CYMBALTA  Take 1  capsule (60 mg total) by mouth daily. Start taking on: May 08, 2024    gabapentin  300 MG capsule Commonly known as: NEURONTIN  Take 1 capsule (300 mg total) by mouth at bedtime. What changed: when to take this    hydrOXYzine  25 MG tablet Commonly known as: ATARAX  Take 1 tablet (25 mg total) by mouth 3 (three) times daily as needed for anxiety.    ibuprofen  400 MG tablet Commonly known as: ADVIL  Take 1 tablet (400 mg total) by mouth every 8 (eight) hours as needed for up to 10 days for moderate pain (pain score 4-6). If no improvement after Tylenol  What changed:  medication strength how much to take reasons to take this additional instructions    iron  polysaccharides 150 MG capsule Commonly known as: NIFEREX Take 1 capsule (150 mg total) by mouth daily. Start taking on: May 08, 2024    magnesium  oxide 400 (240 Mg) MG tablet Commonly known as: MAG-OX Take 1 tablet (400 mg total) by mouth 2 (two) times daily for 7 days.    midodrine  5 MG tablet Commonly known as: PROAMATINE  Take 2 tablets (10 mg total) by mouth 3 (three) times daily as needed (SBP <110).    pantoprazole  40 MG tablet Commonly known as: PROTONIX  Take 1 tablet (40 mg total) by mouth daily.    polyethylene glycol 17 g packet Commonly known as: MIRALAX  / GLYCOLAX  Take 17 g by mouth daily as needed for moderate constipation.    potassium chloride  SA 20 MEQ tablet Commonly known as: KLOR-CON  M Take 2 tablets (40 mEq total) by mouth daily for 3 days. Start taking on: May 08, 2024    potassium chloride  SA 20 MEQ tablet Commonly known as: KLOR-CON  M Take 1 tablet (20 mEq total) by mouth every Monday, Wednesday, and Friday at 8 PM. Start taking on: May 12, 2024    Torsemide  40 MG Tabs Take 40 mg by mouth daily for 3 days. Start taking on: May 08, 2024    torsemide  20 MG tablet Commonly known as: DEMADEX  Take 1 tablet (20 mg total) by mouth every Monday, Wednesday, and Friday at 8 PM. Start taking on: May 12, 2024     Relevant Imaging Results:  Relevant Lab Results:   Additional Information SS#: [id number]  Loman Risk, RN

## 2024-05-07 NOTE — Discharge Summary (Signed)
 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  Follow up LABS/TEST:  Repeat labs after 1 wk   Follow-up Information     [name] .   Why: Patient making own follow up appt Contact information: [address] [place] [address] [phone]         Psych Follow up in 1 month(s).          Infections Disease Follow up in 1 month(s).                 Diet recommendation: low fat diet  Activity: The patient is advised to gradually reintroduce usual activities, as tolerated  Discharge Condition: stable  Code Status: Full code   History of present illness: As per the H and P dictated on admission Hospital Course:  47yo with h/o polysubstance (ETOH, cocaine) abuse, afib, homelessness, AIDS, hypothyroidism, and DM who presented on 12/08/23 with acute respiratory failure.  She was found down by a friend and could not wake her up.  She was found to be hypoxic in the 70s. Admitted to ICU, intubated, started on antibiotics for pneumonia.  12/31: extubated. ID consulted.  MRI brain with advanced cerebral atrophy.  Psych evaluated - "Patient is lacking and reasoning in her medical care for example she is unable to describe the consequence that she will have if she does not take her medications or has complications with her chronic conditions"  and needs assistance with housing and finances due to cognitive decline.  APS/DSS pending guardianship and will need placement.     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 daily and hydroxyzine  as needed for anxiety.  Follow-up with psych for further recommendation as an outpatient.  # AIDS (acquired immune deficiency syndrome) Continue Mepron  (atovaquone ) 1500 mg p.o. daily for PCP prophylaxis. S/p Biktarvy , d/c'd on 5/27 due to wt gain and started Dovanto as per ID  Nutritionist consulted, patient was advised for calorie restricted diet and lose body weight.  Continue to ambulate.      Lab Results  Component Value Date/Time    HIV1RNAQUANT 60 03/18/2024 05:03 AM  Last CD4 count of 03-18-2024 was 416   # Chronic lower backache due to degenerative joint disease, right knee osteoarthritis, developed right iliotibial band syndrome and right lower costochondritis.   Patient received prednisone  40 mg p.o. daily for 3 days on 5/15.   5/25 CT LS spine: No acute finding. Lumbar spine degeneration most notably affecting the L4-5 and L5-S1 facets. Patient was started on Neurontin  300 mg p.o. nightly Continue Tylenol  and ibuprofen  as needed.  Continue Voltaren  gel.  And continue heat pack as needed for lower backache Ambulate every few hours to prevent deconditioning and joint stiffness.   # Recurrent edema of right lower extremity, left lower extremity edema improved. Patient  required multiple times diuretics off and on. 5/28 still have some edema in the right lower extremity, so started Torsemide  40 mg p.o. daily for 3 days followed by 20 mg 3 times a week.  Continue potassium chloride  40 mEq p.o. daily for 3 days followed by 20 mg 3 times a week along with torsemide . Monitor volume status and titrating dose of torsemide  accordingly.  Repeat BMP after 1 week to check electrolytes.  # Hypomagnesemia,  continue magnesium  oxide 400 mg p.o. twice daily for 7 days.  Repeat blood work after 7 days # Cocaine abuse: Drug abuse abstinence counseling done # AKI (acute kidney injury) (HCC)-resolved. # Hypotension-resolved, Off midodrine  therapy. Use midodrine  as needed if needed # Diarrhea- resolved as of 03/22/2024 Likely overflow constipation causing her diarrhea.  Patient did not like lactulose  or MiraLAX .  Use laxatives as needed # Pancytopenia: - resolved. # Iron  deficiency anemia, transferrin saturation 10%, started oral iron  supplement with vitamin C . Folic acid  levels within normal range Vitamin B12 level 290, goal >400.  S/p vitamin B12 1000 mcg IM injection x 1 dose followed by oral supplement.   # Encounter for assessment of decision-making capacity She lacks capacity to make medical decisions. TOC attempted to pursue legal guardianship with the court system.  # Obesity Class 1 Body mass index is 33.6 kg/m.  Nutrition Problem: Severe Malnutrition Etiology: chronic illness (HIV, homelessness, substance abuse) Nutrition Interventions: Calorie restricted diet and daily exercise advised to lose body weight.  Lifestyle modification discussed.  - Patient was instructed, not to drive, operate heavy machinery, perform activities at heights, swimming or participation in water activities or provide baby sitting services while on Pain, Sleep and Anxiety Medications; until her outpatient Physician has advised to do so again.  - Also recommended to not to take more than prescribed Pain, Sleep and Anxiety Medications.  Patient was seen by physical therapy, who recommended Therapy, which was arranged. On the day of the discharge the patient's vitals were stable, and no other acute medical condition were reported by patient. the patient was felt safe to be discharge at group home with Therapy.  Consultants:  Psych Infectious Disease Orthopedics   Procedures: None  Discharge Exam: General: Appear  in no distress, no Rash; Oral Mucosa Clear, moist. Cardiovascular: S1 and S2 Present, no Murmur, Respiratory: normal respiratory effort, Bilateral Air entry present and no Crackles, no wheezes Abdomen: Bowel Sound present, Soft and no tenderness, no hernia Extremities: RLE 2+ Pedal edema, no calf tenderness Neurology: alert and oriented to time, place, and person affect appropriate.  Filed Weights   05/05/24 0500 05/06/24 0315 05/07/24 0344  Weight: 102 kg 102.4 kg 100.2 kg   Vitals:   05/07/24 0344 05/07/24 0734  BP: 129/83 111/77  Pulse: 90 96  Resp: 20 16  Temp: 98.1 F (36.7 C) 98.1 F (36.7 C)  SpO2: 100% 100%    DISCHARGE MEDICATION: Allergies as of 05/07/2024       Reactions   Fish Allergy Other (See Comments)   Crab legs result in itching Crab legs result in itching  Crab legs result in itching   Shellfish Allergy Other (See Comments)   Crab legs result in itching   Buprenorphine Hcl    Pt states she not allergic   Morphine  And Codeine Itching   hives   Sulfa  Antibiotics Rash        Medication List     STOP taking these medications    Biktarvy  50-200-25 MG Tabs tablet Generic  drug: bictegravir-emtricitabine -tenofovir  AF   cephALEXin  500 MG capsule Commonly known as: KEFLEX        TAKE these medications    acetaminophen  325 MG tablet Commonly known as: TYLENOL  Take 2 tablets (650 mg total) by mouth every 6 (six) hours as needed for mild pain (pain score 1-3), fever, headache or moderate pain (pain score 4-6).   ascorbic acid  500 MG tablet Commonly known as: VITAMIN C  Take 1 tablet (500 mg total) by mouth daily. Start taking on: May 08, 2024   atovaquone  750 MG/5ML suspension Commonly known as: MEPRON  Take 10 mLs (1,500 mg total) by mouth daily with breakfast.   cyanocobalamin  1000 MCG tablet Take 1 tablet (1,000 mcg total) by mouth daily. Start taking on: May 08, 2024   diclofenac  Sodium 1 % Gel Commonly known as: VOLTAREN  Apply 2 g  topically 3 (three) times daily as needed (pain in right hip/thigh and lower back).   dolutegravir -lamiVUDine  50-300 MG tablet Commonly known as: DOVATO  Take 1 tablet by mouth daily.   Dulcolax 5 MG EC tablet Generic drug: bisacodyl  Take 2 tablets (10 mg total) by mouth daily as needed for severe constipation.   DULoxetine  60 MG capsule Commonly known as: CYMBALTA  Take 1 capsule (60 mg total) by mouth daily. Start taking on: May 08, 2024   gabapentin  300 MG capsule Commonly known as: NEURONTIN  Take 1 capsule (300 mg total) by mouth at bedtime. What changed: when to take this   hydrOXYzine  25 MG tablet Commonly known as: ATARAX  Take 1 tablet (25 mg total) by mouth 3 (three) times daily as needed for anxiety.   ibuprofen  400 MG tablet Commonly known as: ADVIL  Take 1 tablet (400 mg total) by mouth every 8 (eight) hours as needed for up to 10 days for moderate pain (pain score 4-6). If no improvement after Tylenol  What changed:  medication strength how much to take reasons to take this additional instructions   iron  polysaccharides 150 MG capsule Commonly known as: NIFEREX Take 1 capsule (150 mg total) by mouth daily. Start taking on: May 08, 2024   magnesium  oxide 400 (240 Mg) MG tablet Commonly known as: MAG-OX Take 1 tablet (400 mg total) by mouth 2 (two) times daily for 7 days.   midodrine  5 MG tablet Commonly known as: PROAMATINE  Take 2 tablets (10 mg total) by mouth 3 (three) times daily as needed (SBP <110).   pantoprazole  40 MG tablet Commonly known as: PROTONIX  Take 1 tablet (40 mg total) by mouth daily.   polyethylene glycol 17 g packet Commonly known as: MIRALAX  / GLYCOLAX  Take 17 g by mouth daily as needed for moderate constipation.   potassium chloride  SA 20 MEQ tablet Commonly known as: KLOR-CON  M Take 2 tablets (40 mEq total) by mouth daily for 3 days. Start taking on: May 08, 2024   potassium chloride  SA 20 MEQ tablet Commonly known as: KLOR-CON   M Take 1 tablet (20 mEq total) by mouth every Monday, Wednesday, and Friday at 8 PM. Start taking on: May 12, 2024   Torsemide  40 MG Tabs Take 40 mg by mouth daily for 3 days. Start taking on: May 08, 2024   torsemide  20 MG tablet Commonly known as: DEMADEX  Take 1 tablet (20 mg total) by mouth every Monday, Wednesday, and Friday at 8 PM. Start taking on: May 12, 2024       Allergies  Allergen Reactions   Fish Allergy Other (See Comments)    Crab legs result in itching  Crab legs result in itching  Crab legs result in itching   Shellfish Allergy Other (See Comments)    Crab legs result in itching   Buprenorphine Hcl     Pt states she not allergic   Morphine  And Codeine Itching    hives   Sulfa  Antibiotics Rash   Discharge Instructions     Call MD for:  difficulty breathing, headache or visual disturbances   Complete by: As directed    Call MD for:  extreme fatigue   Complete by: As directed    Call MD for:  persistant dizziness or light-headedness   Complete by: As directed    Call MD for:  persistant nausea and vomiting   Complete by: As directed    Call MD for:  severe uncontrolled pain   Complete by: As directed    Call MD for:  temperature >100.4   Complete by: As directed    Diet - low sodium heart healthy   Complete by: As directed    Discharge instructions   Complete by: As directed    F/u with PCP, need to be seen by an MD in 2-3 days. Repeat labs after 1 wk   Increase activity slowly   Complete by: As directed    No wound care   Complete by: As directed        The results of significant diagnostics from this hospitalization (including imaging, microbiology, ancillary and laboratory) are listed below for reference.    Significant Diagnostic Studies: CT LUMBAR SPINE W WO CONTRAST Result Date: 05/04/2024 CLINICAL DATA:  Lumbar pain.  No known injury. EXAM: CT LUMBAR SPINE WITH AND WITHOUT CONTRAST TECHNIQUE: Multidetector CT imaging of the lumbar  spine was performed without and with intravenous contrast administration. Multiplanar CT image reconstructions were also generated. RADIATION DOSE REDUCTION: This exam was performed according to the departmental dose-optimization program which includes automated exposure control, adjustment of the mA and/or kV according to patient size and/or use of iterative reconstruction technique. CONTRAST:  80mL OMNIPAQUE  IOHEXOL  300 MG/ML  SOLN COMPARISON:  12/21/2023 lumbar MRI FINDINGS: Segmentation: The lowest open disc space is numbered L5-S1 based on prior numbering scheme. This places no ribs at the T12 level. Alignment: Negative for listhesis Vertebrae: No fracture or aggressive bone lesion. Paraspinal and other soft tissues: No evidence of perispinal mass or inflammation. Disc levels: Facet osteoarthritis especially at L4-5 and L5-S1 with L4-5 joint space widening and subchondral irregularity. Active facet arthritis seen at L4-5 on prior MRI. Mild to moderate narrowing of the thecal sac at L4-5, similar to prior MRI in related to short pedicles, degeneration, and generalized expansion of the epidural fat. No herniation seen superimposed on the epidural venous plexus. IMPRESSION: No acute finding. Lumbar spine degeneration most notably affecting the L4-5 and L5-S1 facets. Electronically Signed   By: Ronnette Coke M.D.   On: 05/04/2024 12:17   DG Foot Complete Right Result Date: 04/27/2024 CLINICAL DATA:  Right ankle swelling. EXAM: RIGHT FOOT COMPLETE - 3+ VIEW COMPARISON:  None Available. FINDINGS: Moderate hallux valgus. Diffuse decreased bone mineralization. Mild great toe metatarsophalangeal subchondral sclerosis. Mild soft tissue swelling medial to the great toe metatarsal head. Mild plantar calcaneal heel spur. Minimal dorsal talonavicular navicular-cuneiform degenerative osteophytes on lateral view. No acute fracture is seen. No dislocation. No cortical erosion. Mild atherosclerotic calcifications.  IMPRESSION: 1. Moderate hallux valgus. 2. Mild great toe metatarsophalangeal osteoarthritis. 3. Mild plantar calcaneal heel spur. Electronically Signed   By: Loanne Rim.D.  On: 04/27/2024 11:30   US  Venous Img Lower Unilateral Right (DVT) Result Date: 04/25/2024 CLINICAL DATA:  Right leg swelling for 1 week.  Right leg pain. EXAM: Right LOWER EXTREMITY VENOUS DOPPLER ULTRASOUND TECHNIQUE: Gray-scale sonography with compression, as well as color and duplex ultrasound, were performed to evaluate the deep venous system(s) from the level of the common femoral vein through the popliteal and proximal calf veins. COMPARISON:  None Available. FINDINGS: VENOUS Normal compressibility of the common femoral, superficial femoral, and popliteal veins, as well as the visualized calf veins. Visualized portions of profunda femoral vein and great saphenous vein unremarkable. No filling defects to suggest DVT on grayscale or color Doppler imaging. Doppler waveforms show normal direction of venous flow, normal respiratory plasticity and response to augmentation. Limited views of the contralateral common femoral vein are unremarkable. IMPRESSION: Negative for right lower extremity DVT. Electronically Signed   By: Susan Ensign   On: 04/25/2024 15:48   ECHOCARDIOGRAM COMPLETE Result Date: 04/09/2024    ECHOCARDIOGRAM REPORT   Patient Name:   Meghan Jarvis Date of Exam: 04/09/2024 Medical Rec #:  [id number]       Height:       68.0 in Accession #:    [id number]      Weight:       193.8 lb Date of Birth:  October 23, 1976       BSA:          2.017 m Patient Age:    47 years        BP:           127/85 mmHg Patient Gender: F               HR:           100 bpm. Exam Location:  [facility] Procedure: 2D Echo, Cardiac Doppler and Color Doppler (Both Spectral and Color            Flow Doppler were utilized during procedure). Indications:     CHF-acute diastolic I50.31  History:         Patient has prior history of Echocardiogram  examinations, most                  recent 05/11/2020. No cardiac history listed in chart.  Sonographer:     Broadus Canes Referring Phys:  [id number] Althia Atlas Diagnosing Phys: Belva Boyden MD IMPRESSIONS  1. Left ventricular ejection fraction, by estimation, is 55 to 60%. The left ventricle has normal function. The left ventricle has no regional wall motion abnormalities. Left ventricular diastolic parameters are consistent with Grade I diastolic dysfunction (impaired relaxation).  2. Right ventricular systolic function is normal. The right ventricular size is normal. There is normal pulmonary artery systolic pressure. The estimated right ventricular systolic pressure is 24.9 mmHg.  3. The mitral valve is normal in structure. No evidence of mitral valve regurgitation. No evidence of mitral stenosis.  4. The aortic valve is normal in structure. Aortic valve regurgitation is not visualized. No aortic stenosis is present.  5. The inferior vena cava is normal in size with greater than 50% respiratory variability, suggesting right atrial pressure of 3 mmHg. FINDINGS  Left Ventricle: Left ventricular ejection fraction, by estimation, is 55 to 60%. The left ventricle has normal function. The left ventricle has no regional wall motion abnormalities. Strain was performed and the global longitudinal strain is indeterminate. The left ventricular internal cavity size was normal in size. There is no left ventricular hypertrophy.  Left ventricular diastolic parameters are consistent with Grade I diastolic dysfunction (impaired relaxation). Right Ventricle: The right ventricular size is normal. No increase in right ventricular wall thickness. Right ventricular systolic function is normal. There is normal pulmonary artery systolic pressure. The tricuspid regurgitant velocity is 2.23 m/s, and  with an assumed right atrial pressure of 5 mmHg, the estimated right ventricular systolic pressure is 24.9 mmHg. Left Atrium: Left atrial size  was normal in size. Right Atrium: Right atrial size was normal in size. Pericardium: There is no evidence of pericardial effusion. Mitral Valve: The mitral valve is normal in structure. Mild mitral annular calcification. No evidence of mitral valve regurgitation. No evidence of mitral valve stenosis. MV peak gradient, 4.2 mmHg. The mean mitral valve gradient is 3.0 mmHg. Tricuspid Valve: The tricuspid valve is normal in structure. Tricuspid valve regurgitation is mild . No evidence of tricuspid stenosis. Aortic Valve: The aortic valve is normal in structure. Aortic valve regurgitation is not visualized. No aortic stenosis is present. Aortic valve mean gradient measures 4.0 mmHg. Aortic valve peak gradient measures 8.1 mmHg. Aortic valve area, by VTI measures 2.74 cm. Pulmonic Valve: The pulmonic valve was normal in structure. Pulmonic valve regurgitation is not visualized. No evidence of pulmonic stenosis. Aorta: The aortic root is normal in size and structure. Venous: The inferior vena cava is normal in size with greater than 50% respiratory variability, suggesting right atrial pressure of 3 mmHg. IAS/Shunts: No atrial level shunt detected by color flow Doppler. Additional Comments: 3D was performed not requiring image post processing on an independent workstation and was indeterminate.  LEFT VENTRICLE PLAX 2D LVIDd:         4.80 cm   Diastology LVIDs:         3.20 cm   LV e' medial:    10.30 cm/s LV PW:         0.90 cm   LV E/e' medial:  6.8 LV IVS:        1.30 cm   LV e' lateral:   11.90 cm/s LVOT diam:     2.00 cm   LV E/e' lateral: 5.9 LV SV:         63 LV SV Index:   31 LVOT Area:     3.14 cm  RIGHT VENTRICLE RV Basal diam:  3.00 cm RV Mid diam:    2.80 cm RV S prime:     12.90 cm/s LEFT ATRIUM             Index        RIGHT ATRIUM           Index LA diam:        2.50 cm 1.24 cm/m   RA Area:     12.10 cm LA Vol (A2C):   30.9 ml 15.32 ml/m  RA Volume:   26.30 ml  13.04 ml/m LA Vol (A4C):   21.9 ml 10.86  ml/m LA Biplane Vol: 26.9 ml 13.34 ml/m  AORTIC VALVE AV Area (Vmax):    2.70 cm AV Area (Vmean):   2.30 cm AV Area (VTI):     2.74 cm AV Vmax:           142.00 cm/s AV Vmean:          98.000 cm/s AV VTI:            0.229 m AV Peak Grad:      8.1 mmHg AV Mean Grad:      4.0  mmHg LVOT Vmax:         122.00 cm/s LVOT Vmean:        71.700 cm/s LVOT VTI:          0.200 m LVOT/AV VTI ratio: 0.87  AORTA Ao Root diam: 3.30 cm MITRAL VALVE               TRICUSPID VALVE MV Area (PHT): 4.89 cm    TR Peak grad:   19.9 mmHg MV Area VTI:   3.24 cm    TR Vmax:        223.00 cm/s MV Peak grad:  4.2 mmHg MV Mean grad:  3.0 mmHg    SHUNTS MV Vmax:       1.03 m/s    Systemic VTI:  0.20 m MV Vmean:      81.5 cm/s   Systemic Diam: 2.00 cm MV Decel Time: 155 msec MV E velocity: 69.70 cm/s MV A velocity: 98.50 cm/s MV E/A ratio:  0.71 Belva Boyden MD Electronically signed by Belva Boyden MD Signature Date/Time: 04/09/2024/4:33:26 PM    Final     Microbiology: No results found for this or any previous visit (from the past 240 hours).   Labs: CBC: Recent Labs  Lab 05/07/24 0501  WBC 5.2  HGB 9.0*  HCT 27.9*  MCV 80.6  PLT 279   Basic Metabolic Panel: Recent Labs  Lab 05/07/24 0501  NA 142  K 3.6  CL 104  CO2 28  GLUCOSE 132*  BUN 8  CREATININE 0.81  CALCIUM  9.1  MG 1.6*  PHOS 5.3*   Liver Function Tests: Recent Labs  Lab 05/07/24 0501  AST 22  ALT 16  ALKPHOS 60  BILITOT 0.4  PROT 7.1  ALBUMIN 3.0*   No results for input(s): "LIPASE", "AMYLASE" in the last 168 hours. No results for input(s): "AMMONIA" in the last 168 hours. Cardiac Enzymes: No results for input(s): "CKTOTAL", "CKMB", "CKMBINDEX", "TROPONINI" in the last 168 hours. BNP (last 3 results) Recent Labs    12/08/23 2213 04/08/24 1501  BNP 62.7 70.7   CBG: No results for input(s): "GLUCAP" in the last 168 hours.  Time spent: 35 minutes  Signed:  Althia Atlas  [facility] 05/07/2024 12:44 PM

## 2024-05-14 ENCOUNTER — Emergency Department
Admission: EM | Admit: 2024-05-14 | Discharge: 2024-05-14 | Payer: MEDICAID | Attending: Emergency Medicine | Admitting: Emergency Medicine

## 2024-05-14 ENCOUNTER — Emergency Department: Payer: MEDICAID

## 2024-05-14 ENCOUNTER — Other Ambulatory Visit: Payer: Self-pay

## 2024-05-14 ENCOUNTER — Encounter: Payer: Self-pay | Admitting: Emergency Medicine

## 2024-05-14 DIAGNOSIS — R1030 Lower abdominal pain, unspecified: Secondary | ICD-10-CM | POA: Diagnosis present

## 2024-05-14 DIAGNOSIS — Z21 Asymptomatic human immunodeficiency virus [HIV] infection status: Secondary | ICD-10-CM | POA: Diagnosis not present

## 2024-05-14 DIAGNOSIS — J45909 Unspecified asthma, uncomplicated: Secondary | ICD-10-CM | POA: Diagnosis not present

## 2024-05-14 DIAGNOSIS — N390 Urinary tract infection, site not specified: Secondary | ICD-10-CM | POA: Insufficient documentation

## 2024-05-14 LAB — COMPREHENSIVE METABOLIC PANEL WITH GFR
ALT: 20 U/L (ref 0–44)
AST: 28 U/L (ref 15–41)
Albumin: 3.6 g/dL (ref 3.5–5.0)
Alkaline Phosphatase: 75 U/L (ref 38–126)
Anion gap: 8 (ref 5–15)
BUN: 12 mg/dL (ref 6–20)
CO2: 26 mmol/L (ref 22–32)
Calcium: 9.2 mg/dL (ref 8.9–10.3)
Chloride: 102 mmol/L (ref 98–111)
Creatinine, Ser: 0.96 mg/dL (ref 0.44–1.00)
GFR, Estimated: >60 mL/min (ref >60)
Glucose, Bld: 103 mg/dL — ABNORMAL HIGH (ref 70–99)
Potassium: 4.6 mmol/L (ref 3.5–5.1)
Sodium: 136 mmol/L (ref 135–145)
Total Bilirubin: 0.8 mg/dL (ref 0.0–1.2)
Total Protein: 8.2 g/dL — ABNORMAL HIGH (ref 6.5–8.1)

## 2024-05-14 LAB — URINALYSIS, ROUTINE W REFLEX MICROSCOPIC
Bilirubin Urine: NEGATIVE
Glucose, UA: NEGATIVE mg/dL
Hgb urine dipstick: NEGATIVE
Ketones, ur: NEGATIVE mg/dL
Nitrite: NEGATIVE
Protein, ur: NEGATIVE mg/dL
Specific Gravity, Urine: 1.008 (ref 1.005–1.030)
pH: 7 (ref 5.0–8.0)

## 2024-05-14 LAB — CBC
HCT: 31.2 % — ABNORMAL LOW (ref 36.0–46.0)
Hemoglobin: 9.9 g/dL — ABNORMAL LOW (ref 12.0–15.0)
MCH: 26.1 pg (ref 26.0–34.0)
MCHC: 31.7 g/dL (ref 30.0–36.0)
MCV: 82.3 fL (ref 80.0–100.0)
Platelets: 322 10*3/uL (ref 150–400)
RBC: 3.79 MIL/uL — ABNORMAL LOW (ref 3.87–5.11)
RDW: 15.4 % (ref 11.5–15.5)
WBC: 5.6 10*3/uL (ref 4.0–10.5)
nRBC: 0 % (ref 0.0–0.2)

## 2024-05-14 LAB — LIPASE, BLOOD: Lipase: 43 U/L (ref 11–51)

## 2024-05-14 MED ORDER — SODIUM CHLORIDE 0.9 % IV SOLN
1.0000 g | Freq: Once | INTRAVENOUS | Status: AC
  Administered 2024-05-14: 1 g via INTRAVENOUS
  Filled 2024-05-14: qty 10

## 2024-05-14 MED ORDER — CYCLOBENZAPRINE HCL 10 MG PO TABS
10.0000 mg | ORAL_TABLET | Freq: Once | ORAL | Status: AC
  Administered 2024-05-14: 10 mg via ORAL
  Filled 2024-05-14: qty 1

## 2024-05-14 MED ORDER — IOHEXOL 300 MG/ML  SOLN
100.0000 mL | Freq: Once | INTRAMUSCULAR | Status: AC | PRN
  Administered 2024-05-14: 100 mL via INTRAVENOUS

## 2024-05-14 MED ORDER — SODIUM CHLORIDE 0.9 % IV BOLUS
1000.0000 mL | Freq: Once | INTRAVENOUS | Status: AC
  Administered 2024-05-14: 1000 mL via INTRAVENOUS

## 2024-05-14 MED ORDER — KETOROLAC TROMETHAMINE 15 MG/ML IJ SOLN
15.0000 mg | Freq: Once | INTRAMUSCULAR | Status: AC
  Administered 2024-05-14: 15 mg via INTRAVENOUS
  Filled 2024-05-14: qty 1

## 2024-05-14 MED ORDER — HYDROMORPHONE HCL 1 MG/ML IJ SOLN
0.5000 mg | Freq: Once | INTRAMUSCULAR | Status: AC
  Administered 2024-05-14: 0.5 mg via INTRAVENOUS
  Filled 2024-05-14: qty 0.5

## 2024-05-14 MED ORDER — CEFPODOXIME PROXETIL 200 MG PO TABS: 200.0000 mg | ORAL_TABLET | Freq: Two times a day (BID) | ORAL | 0 refills | Status: DC

## 2024-05-14 MED ORDER — CEFPODOXIME PROXETIL 200 MG PO TABS: 200.0000 mg | ORAL_TABLET | Freq: Two times a day (BID) | ORAL | 0 refills | Status: AC

## 2024-05-14 MED ORDER — ACETAMINOPHEN 500 MG PO TABS
1000.0000 mg | ORAL_TABLET | Freq: Once | ORAL | Status: AC
  Administered 2024-05-14: 1000 mg via ORAL
  Filled 2024-05-14: qty 2

## 2024-05-14 MED ORDER — OXYCODONE HCL 5 MG PO TABS
5.0000 mg | ORAL_TABLET | Freq: Once | ORAL | Status: DC
  Filled 2024-05-14: qty 1

## 2024-05-14 NOTE — ED Triage Notes (Signed)
 Patient to ED via POV for lower abd pain. Ongoing x1 week. Having some nausea and 1 episode of vomiting this AM. Also had back pain. Denies burning with urination.   Pt has temporary guardianship with [facility]- Candace Darryll Eng [phone]. She is with patient at this time.

## 2024-05-14 NOTE — ED Provider Notes (Signed)
 [facility] Provider Note    Event Date/Time   First MD Initiated Contact with Patient 05/14/24 1458     (approximate)   History   Abdominal Pain  HPI  ALKA FALWELL is a 48 y.o. female with history of HIV, asthma, depression, substance use, presenting with her social worker for lower abdominal pain.  Abdominal pain has been ongoing for the past week, associated with some nausea and one episode of nausea vomiting this morning.  She denies any urinary symptoms, no new weakness or numbness.  Also complaining of lower back pain.  States no diarrhea, had a bowel movement this morning.  She has not any chest pain or shortness of breath, no cough, no fevers.   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  Does have history of chronic lower back pain.  Is on Neurontin  for it.  She had an echo done at the end of April that showed EF of 55 to 60%.  Physical Exam   Triage Vital Signs: ED Triage Vitals  Encounter Vitals Group     BP 05/14/24 1420 112/72     Systolic BP Percentile --      Diastolic BP Percentile --      Pulse Rate 05/14/24 1420 (!) 110     Resp 05/14/24 1420 17     Temp 05/14/24 1420 98.2 F (36.8 C)     Temp Source 05/14/24 1420 Oral     SpO2 05/14/24 1420 100 %     Weight 05/14/24 1417 225 lb (102.1 kg)     Height 05/14/24 1417 5\' 8"  (1.727 m)     Head Circumference --      Peak Flow --      Pain Score 05/14/24 1417 10     Pain Loc --      Pain Education --      Exclude from Growth Chart --     Most recent vital signs: Vitals:   05/14/24 1730 05/14/24 1815  BP: 95/73 104/62  Pulse: 91 95  Resp:    Temp:    SpO2: 100% 100%     General: Awake, no distress.  CV:  Good peripheral perfusion.  Resp:  Normal effort.  Abd:  No  distention.  Soft, tender to bilateral lower quadrants without guarding Other:  No midline spinal tenderness, no CVA tenderness, she does have bilateral lower paralumbar tenderness to palpation.  No overlying rash.  No focal weakness or numbness, no unilateral calf swelling or tenderness.   ED Results / Procedures / Treatments   Labs (all labs ordered are listed, but only abnormal results are displayed) Labs Reviewed  COMPREHENSIVE METABOLIC PANEL WITH GFR - Abnormal; Notable for the following components:      Result Value   Glucose, Bld 103 (*)    Total Protein 8.2 (*)    All other components within normal limits  CBC - Abnormal; Notable for the following components:   RBC 3.79 (*)    Hemoglobin 9.9 (*)    HCT 31.2 (*)    All other components within normal limits  URINALYSIS, ROUTINE W REFLEX MICROSCOPIC - Abnormal; Notable for the following components:   Color, Urine STRAW (*)    APPearance HAZY (*)    Leukocytes,Ua SMALL (*)  Bacteria, UA MANY (*)    All other components within normal limits  LIPASE, BLOOD      RADIOLOGY On my independent interpretation, CT without obvious free air   PROCEDURES:  Critical Care performed: No  Procedures   MEDICATIONS ORDERED IN ED: Medications  sodium chloride  0.9 % bolus 1,000 mL (0 mLs Intravenous Stopped 05/14/24 1709)  HYDROmorphone  (DILAUDID ) injection 0.5 mg (0.5 mg Intravenous Given 05/14/24 1551)  iohexol  (OMNIPAQUE ) 300 MG/ML solution 100 mL (100 mLs Intravenous Contrast Given 05/14/24 1538)  cefTRIAXone  (ROCEPHIN ) 1 g in sodium chloride  0.9 % 100 mL IVPB (0 g Intravenous Stopped 05/14/24 1709)  cyclobenzaprine  (FLEXERIL ) tablet 10 mg (10 mg Oral Given 05/14/24 1656)  ketorolac  (TORADOL ) 15 MG/ML injection 15 mg (15 mg Intravenous Given 05/14/24 1655)  acetaminophen  (TYLENOL ) tablet 1,000 mg (1,000 mg Oral Given 05/14/24 1703)     IMPRESSION / MDM / ASSESSMENT AND PLAN / ED COURSE  I reviewed the triage vital signs and the nursing  notes.                              Differential diagnosis includes, but is not limited to, colitis, diverticulitis, musculoskeletal pain, UTI, nephrolithiasis.  Will get labs, UA, CT abdomen pelvis.  IV fluids.  IV pain meds.  Patient's presentation is most consistent with acute presentation with potential threat to life or bodily function.  Independent interpretation of labs and imaging below.  On reassessment patient was asking about lower extremity swelling, they appear symmetrical, trace lower extremity edema, she does not have any shortness of breath or chest pain, had a recent echo that was normal.  Suspect this might be venous insufficiency, recommended that she wear compression stockings as well as keep her legs elevated when she is sitting or laying down.  Clinical course as below, first dose of ceftriaxone  given in the emergency department, will discharge her with p.o. antibiotics outpatient.  On reassessment patient is still complaining about lower back pain, she denies any incontinence, no saddle anesthesia, no weakness or numbness, patient does have history of chronic back pain, she denies any new falls or trauma, no midline spinal tenderness.  CT imaging did not show any acute fractures.  Will give her some Toradol , oxycodone , Lidoderm  patch, Tylenol  here.  Patient declined oxycodone , will give for the rest of the medications.  On reassessment after medications, pain is controlled, considered but no indication for inpatient admission at this time, she is safe for outpatient management.  Will discharge to return precautions.  Sent her antibiotics to the pharmacy for her to take outpatient.  Instructed to follow-up with primary care doctor this week for reassessment.  Clinical Course as of 05/14/24 1830  Wed May 14, 2024  1601 CT ABDOMEN PELVIS W CONTRAST IMPRESSION: 1. No acute intra-abdominal or pelvic pathology. 2.  Aortic Atherosclerosis (ICD10-I70.0).   [TT]  1601 Independent  review of labs, electrolytes not severely deranged, LFTs are normal, no leukocytosis, lipase is normal, UA does show small leukocytes and bacteria, given the abdominal pain that is worse in the suprapubic region, will give her a dose of ceftriaxone  here and discharged with antibiotics outpatient. [TT]    Clinical Course User Index [TT] Drenda Gentle, Richard Champion, MD     FINAL CLINICAL IMPRESSION(S) / ED DIAGNOSES   Final diagnoses:  Lower abdominal pain  Urinary tract infection without hematuria, site unspecified     Rx / DC Orders   ED  Discharge Orders          Ordered    cefpodoxime (VANTIN) 200 MG tablet  2 times daily        05/14/24 1609             Note:  This document was prepared using Dragon voice recognition software and may include unintentional dictation errors.    Shane Darling, MD 05/14/24 [phone]

## 2024-05-14 NOTE — Discharge Instructions (Signed)
 Please take the antibiotics as prescribed for urinary tract infection.  Please make sure to follow-up with your primary care doctor for further management of your symptoms.

## 2024-05-14 NOTE — ED Notes (Signed)
 Patient Alert and oriented to baseline. Stable and ambulatory to baseline. Patient verbalized understanding of the discharge instructions.  Patient belongings were taken by the patient.

## 2024-05-22 ENCOUNTER — Emergency Department: Payer: MEDICAID

## 2024-05-22 ENCOUNTER — Encounter: Payer: Self-pay | Admitting: *Deleted

## 2024-05-22 ENCOUNTER — Emergency Department
Admission: EM | Admit: 2024-05-22 | Discharge: 2024-05-23 | Disposition: A | Discharge status: Other Acute Inpatient Hospital | Payer: MEDICAID | Attending: Emergency Medicine | Admitting: Emergency Medicine

## 2024-05-22 ENCOUNTER — Other Ambulatory Visit: Payer: Self-pay

## 2024-05-22 DIAGNOSIS — Z21 Asymptomatic human immunodeficiency virus [HIV] infection status: Secondary | ICD-10-CM | POA: Insufficient documentation

## 2024-05-22 DIAGNOSIS — M25562 Pain in left knee: Secondary | ICD-10-CM | POA: Insufficient documentation

## 2024-05-22 DIAGNOSIS — M25461 Effusion, right knee: Secondary | ICD-10-CM | POA: Insufficient documentation

## 2024-05-22 DIAGNOSIS — Z5900 Homelessness unspecified: Secondary | ICD-10-CM | POA: Diagnosis not present

## 2024-05-22 DIAGNOSIS — J45909 Unspecified asthma, uncomplicated: Secondary | ICD-10-CM | POA: Diagnosis not present

## 2024-05-22 MED ORDER — PREDNISONE 50 MG PO TABS: ORAL_TABLET | ORAL | 0 refills | Status: DC

## 2024-05-22 MED ORDER — IBUPROFEN 600 MG PO TABS
600.0000 mg | ORAL_TABLET | Freq: Once | ORAL | Status: AC
  Administered 2024-05-22: 600 mg via ORAL
  Filled 2024-05-22: qty 1

## 2024-05-22 MED ORDER — LIDOCAINE 5 % EX PTCH: 1.0000 | MEDICATED_PATCH | CUTANEOUS | 0 refills | Status: DC

## 2024-05-22 MED ORDER — LIDOCAINE 5 % EX PTCH: 1.0000 | MEDICATED_PATCH | CUTANEOUS | 0 refills | Status: AC

## 2024-05-22 MED ORDER — MELOXICAM 15 MG PO TABS: 15.0000 mg | ORAL_TABLET | Freq: Every day | ORAL | 0 refills | Status: AC

## 2024-05-22 MED ORDER — OXYCODONE-ACETAMINOPHEN 5-325 MG PO TABS
1.0000 | ORAL_TABLET | Freq: Once | ORAL | Status: AC
  Administered 2024-05-22: 1 via ORAL
  Filled 2024-05-22: qty 1

## 2024-05-22 NOTE — ED Notes (Signed)
 See triage note. Pt states knee pain is chronic but today it has become double worse and she cannot bear weight on it. Pt A&Ox4, NAD at time of assessment.

## 2024-05-22 NOTE — ED Triage Notes (Signed)
 EMS brings pt in from [facility] for c/o chronic left knee pain

## 2024-05-22 NOTE — ED Triage Notes (Signed)
 Pt brought in by EMS from [facility].  Pt has left knee pain.  Swelling noted to knee. No known injury to knee   pt reports pain with ambulation.  Pt alert.

## 2024-05-22 NOTE — ED Provider Notes (Signed)
 [facility] Provider Note    Event Date/Time   First MD Initiated Contact with Patient 05/22/24 1942     (approximate)   History   Knee Pain    HPI  Meghan Jarvis is a 48 y.o. female    with a past medical history of chronic midline low back pain, atypical chest pain, pneumonia, UTI, lower abdominal pain, HIV, asthma, depression, substance use  who presents to the ED complaining of left knee pain. According to the patient, pain started a few weeks ago and is getting worse in the last 24 hours.  Patient states she can walk but is painful, pain is waking her up at night, pain is worse when she bends her knees.  Patient denies fever.     Patient Active Problem List   Diagnosis Date Noted  . Weight gain 05/05/2024  . Protein-calorie malnutrition, severe 03/23/2024  . Encounter for assessment of decision-making capacity 03/22/2024  . AIDS (acquired immune deficiency syndrome) (HCC) 02/21/2024  . Right upper quadrant abdominal pain 12/14/2023  . Cocaine abuse (HCC) 12/12/2023  . 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 Protein calorie malnutrition (HCC) 12/10/2023  . Substance induced mood disorder (HCC) 11/30/2023  . Crack cocaine use 11/30/2023  . Homeless 11/28/2023  . HIV (human immunodeficiency virus infection) (HCC)   . Alcohol use disorder, severe, dependence (HCC) 05/18/2016     ROS: Patient currently denies any vision changes, tinnitus, difficulty speaking, facial droop, sore throat, chest pain, shortness of breath, abdominal pain, nausea/vomiting/diarrhea, dysuria, or weakness/numbness/paresthesias in any extremity   Physical Exam   Triage Vital Signs: ED Triage Vitals [05/22/24 1930]  Encounter Vitals Group     BP 102/72     Girls Systolic BP Percentile      Girls Diastolic BP Percentile      Boys Systolic BP Percentile      Boys Diastolic BP Percentile      Pulse Rate 90      Resp 18     Temp 98.3 F (36.8 C)     Temp Source Oral     SpO2 100 %     Weight 230 lb (104.3 kg)     Height 5' 8 (1.727 m)     Head Circumference      Peak Flow      Pain Score 10     Pain Loc      Pain Education      Exclude from Growth Chart     Most recent vital signs: Vitals:   05/22/24 1930  BP: 102/72  Pulse: 90  Resp: 18  Temp: 98.3 F (36.8 C)  SpO2: 100%    Vitals and nursing note reviewed.    Constitutional: Alert, NAD. Able to speak in complete sentences without cough or dyspnea  Eyes: Conjunctivae are normal.  Head: Atraumatic. Nose: No congestion/rhinnorhea. Mouth/Throat: Mucous membranes are moist.   Neck: Painless ROM. Supple. No JVD, nodes, thyromegaly  Cardiovascular:   Good peripheral circulation.RRR no murmurs, gallops, rubs  Respiratory: Normal respiratory effort.  No retractions. Clear to auscultation bilaterally without wheezing or crackles  Gastrointestinal: Soft and nontender.  Musculoskeletal:  no deformity Right knee: Skin: Presence of excoriation of the skin at the level of the patella.  Moderate prepatellar edema, tender to palpation in the popliteal fossa.  Full ROM limited by pain.  No erythema no warmness no signs of infection. Neurologic:  MAE spontaneously. No gross  focal neurologic deficits are appreciated.  Skin:  Skin is warm, dry and intact. No rash noted. Psychiatric: Mood and affect are normal. Speech and behavior are normal.    ED Results / Procedures / Treatments   Labs (all labs ordered are listed, but only abnormal results are displayed) Labs Reviewed - No data to display   EKG     RADIOLOGY I independently reviewed and interpreted imaging and agree with radiologists findings.      PROCEDURES:  Critical Care performed:   Procedures   MEDICATIONS ORDERED IN ED: Medications  ibuprofen  (ADVIL ) tablet 600 mg (600 mg Oral Given 05/22/24 2026)   Clinical Course as of 05/22/24 2147  Thu May 22, 2024  2041  DG Knee Complete 4 Views Left No acute fracture or dislocation.  Small knee joint effusion. [AE]  2041 Updated patient with results of x-ray.  Explained to patient she can be discharged with steroids, NSAIDs, knee brace and crutches with a follow-up with orthopedics in a week.  Patient agreed with the plan [AE]  2146 Patient was ready for discharge, and was complaining about her pain.  Consulted Dr. Cleora Daft who evaluated and did physical exam to the patient ruling out infection,  confirmed patient can be discharged [AE]    Clinical Course User Index [AE] Awilda Lennox, PA-C    IMPRESSION / MDM / ASSESSMENT AND PLAN / ED COURSE  I reviewed the triage vital signs and the nursing notes.  Differential diagnosis includes, but is not limited to, knee effusion, arthritis, fracture, dislocation, foreign body  Patient's presentation is most consistent with acute complicated illness / injury requiring diagnostic workup.   Meghan Jarvis is a 48 y.o., female who came from [facility],  presents today with history of right knee pain that got worse in the last 24 hours.  Patient states bending increases the pain.  At physical exam presence of mild prepatellar edema.  Anterior drawer, posterior drawer, negative.  No signs of infection.  Patient's diagnosis is consistent with right knee effusion. I independently reviewed and interpreted imaging and agree with radiologists findings. I did review the patient's allergies and medications.The patient is in stable and satisfactory condition for discharge home  Patient will be discharged home with prescriptions for meloxicam , prednisone .  Patient is going to be discharged with knee brace and crutches.  Patient is to follow up with orthopedics as needed or otherwise directed. Patient is given ED precautions to return to the ED for any worsening or new symptoms. Discussed plan of care with patient, answered all of patient's questions, Patient  agreeable to plan of care. Advised patient to take medications according to the instructions on the label. Discussed possible side effects of new medications. Patient verbalized understanding.   FINAL CLINICAL IMPRESSION(S) / ED DIAGNOSES   Final diagnoses:  Effusion of right knee     Rx / DC Orders   ED Discharge Orders          Ordered    predniSONE  (DELTASONE ) 50 MG tablet        05/22/24 2048    meloxicam  (MOBIC ) 15 MG tablet  Daily        05/22/24 2048    lidocaine  (LIDODERM ) 5 %  Every 24 hours,   Status:  Discontinued        05/22/24 2049    lidocaine  (LIDODERM ) 5 %  Every 24 hours        05/22/24 2049  Note:  This document was prepared using Dragon voice recognition software and may include unintentional dictation errors.   Awilda Lennox, PA-C 05/22/24 2052    Sung, Jade J, MD 05/26/24 [phone]

## 2024-05-22 NOTE — Discharge Instructions (Addendum)
 You have been diagnosed with effusion of the right knee.  X-ray of the knee did not show fractures or dislocation.  Please take meloxicam  1 tablet with breakfast every day for pain, take prednisone  1 tablet with breakfast the next 5 days.  Please call Dr. Lydia Sams and make an appointment for a follow-up with orthopedics.  Please come back to ED or go to your PCP if you have new symptoms or symptoms worsen.

## 2024-06-16 ENCOUNTER — Other Ambulatory Visit: Payer: Self-pay

## 2024-06-16 ENCOUNTER — Emergency Department
Admission: EM | Admit: 2024-06-16 | Discharge: 2024-06-16 | Disposition: A | Discharge status: Home / Self Care | Payer: MEDICAID | Attending: Emergency Medicine | Admitting: Emergency Medicine

## 2024-06-16 DIAGNOSIS — M545 Low back pain, unspecified: Secondary | ICD-10-CM | POA: Diagnosis present

## 2024-06-16 DIAGNOSIS — Z21 Asymptomatic human immunodeficiency virus [HIV] infection status: Secondary | ICD-10-CM | POA: Insufficient documentation

## 2024-06-16 LAB — URINALYSIS, ROUTINE W REFLEX MICROSCOPIC
Bilirubin Urine: NEGATIVE
Glucose, UA: >=500 mg/dL — AB
Hgb urine dipstick: NEGATIVE
Ketones, ur: NEGATIVE mg/dL
Leukocytes,Ua: NEGATIVE
Nitrite: NEGATIVE
Protein, ur: NEGATIVE mg/dL
Specific Gravity, Urine: 1.02 (ref 1.005–1.030)
pH: 7 (ref 5.0–8.0)

## 2024-06-16 LAB — CBG MONITORING, ED: Glucose-Capillary: 153 mg/dL — ABNORMAL HIGH (ref 70–99)

## 2024-06-16 MED ORDER — NAPROXEN 500 MG PO TABS
500.0000 mg | ORAL_TABLET | Freq: Once | ORAL | Status: AC
  Administered 2024-06-16: 500 mg via ORAL
  Filled 2024-06-16: qty 1

## 2024-06-16 NOTE — ED Notes (Signed)
 Pt called her cousin to come and pick her up. The pt was assisted into the family members car.

## 2024-06-16 NOTE — ED Provider Notes (Signed)
 [facility] Provider Note    Event Date/Time   First MD Initiated Contact with Patient 06/16/24 1928     (approximate)   History   Flank Pain   HPI  Meghan Jarvis is a 48 y.o. female with history of alcohol use disorder, HIV, substance-induced mood disorder, cocaine abuse and as listed in EMR presents to the emergency department for treatment and evaluation of right-sided back, flank pain and right side abdomen that started last night. Pain increases with movement.  Patient denies recent injury and has not taken any medications to help with the symptoms.     Physical Exam    Vitals:   06/16/24 1710 06/16/24 2132  BP: (!) 137/92 127/80  Pulse: 87 85  Resp: 18 18  Temp: 98.4 F (36.9 C) 98.4 F (36.9 C)  SpO2: 100% 99%    General: Awake, no distress.  CV:  Good peripheral perfusion.  Resp:  Normal effort.  Abd:  No distention.  Soft.  No rebound tenderness.  Bowel sounds are active and present x 4 quadrants. Other:  Pain reported with light touch over any area of low back, right hip, right leg, and right side of abdomen   ED Results / Procedures / Treatments   Labs (all labs ordered are listed, but only abnormal results are displayed)  Labs Reviewed  URINALYSIS, ROUTINE W REFLEX MICROSCOPIC - Abnormal; Notable for the following components:      Result Value   Color, Urine YELLOW (*)    APPearance CLEAR (*)    Glucose, UA >=500 (*)    Bacteria, UA RARE (*)    All other components within normal limits  CBG MONITORING, ED - Abnormal; Notable for the following components:   Glucose-Capillary 153 (*)    All other components within normal limits     EKG  Not indicated   RADIOLOGY  Image and radiology report reviewed and interpreted by me. Radiology report consistent with the same.  Not indicated.  PROCEDURES:  Critical Care performed: No  Procedures   MEDICATIONS ORDERED IN ED:  Medications  naproxen  (NAPROSYN )  tablet 500 mg (500 mg Oral Given 06/16/24 2023)     IMPRESSION / MDM / ASSESSMENT AND PLAN / ED COURSE   I have reviewed the triage note and vital signs. Vital signs indicate she is hemodynamically stable.  Afebrile.  Differential diagnosis includes, but is not limited to, functional symptoms, musculoskeletal pain, acute cholecystitis, acute appendicitis.  Patient's presentation is most consistent with acute illness / injury with system symptoms.  48 year old female presenting to the emergency department for treatment and evaluation of low back and right flank/right lower quadrant abdominal pain that started last night.  She denies nausea, vomiting, or diarrhea.  She denies fever.  Upon entering the room, she was resting quietly on the bed watching television.  On exam, she cries with light touch over any area of her low back, right flank, right hip, right lower extremity, or right side abdomen.  There are no open wounds or lesions noted on the skin in these areas.  While awaiting ER room assignment, urinalysis was collected.  She has greater than 500 mg/dL glucose.  Plan will be to get a CBG.  At this time imaging is not indicated.  CBG is within acceptable range.  Patient was medicated with Naprosyn .  She will be discharged home and advised to take ibuprofen  or Aleve  for her pain.  She is to follow-up with her primary  care provider if symptoms are not improving over the next few days.   FINAL CLINICAL IMPRESSION(S) / ED DIAGNOSES   Final diagnoses:  Acute bilateral low back pain without sciatica     Rx / DC Orders   ED Discharge Orders     None        Note:  This document was prepared using Dragon voice recognition software and may include unintentional dictation errors.   Herlinda Kirk NOVAK, FNP 06/16/24 [id number]    Willo Dunnings, MD 06/16/24 [phone]

## 2024-06-16 NOTE — Progress Notes (Unsigned)
 [name]   No chief complaint on file.   HPI:      Meghan Jarvis is a 48 y.o. No obstetric history on file. whose LMP was No LMP recorded. Patient has had a hysterectomy., presents today for her NP annual examination.  Her menses are absent due to hyst???? She {does:18564} have vasomotor sx.   Sex activity: {sex active: 315163}. She {does:18564} have vaginal dryness.  Last Pap: not recent Hx of STDs: {STD hx:14358}  Last mammogram: 10/17 Results were: normal--routine follow-up in 12 months There is no FH of breast cancer. There is no FH of ovarian cancer. The patient {does:18564} do self-breast exams.  Colonoscopy: {hx:15363}  Repeat due after 10*** years.   Tobacco use: {tob:20664} Alcohol use: {Alcohol:11675} No drug use Exercise: {exercise:31265}  She {does:18564} get adequate calcium  and Vitamin D in her diet.  Labs with PCP.   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) 12/12/2023   Protein calorie malnutrition (HCC) 12/10/2023   Substance induced mood disorder (HCC) 11/30/2023   Crack cocaine use 11/30/2023   Homeless 11/28/2023   HIV (human immunodeficiency virus infection) (HCC)    Alcohol use disorder, severe, dependence (HCC) 05/18/2016    Past Surgical History:  Procedure Laterality Date   ABDOMINAL HYSTERECTOMY     APPENDECTOMY      Family History  Family history unknown: Yes    Social History   Socioeconomic History   Marital status: Divorced    Spouse name: Not on file   Number of children: Not on file   Years of education: Not on file   Highest  education level: Not on file  Occupational History   Not on file  Tobacco Use   Smoking status: Every Day    Current packs/day: 1.00    Types: Cigarettes   Smokeless tobacco: Never  Substance and Sexual Activity   Alcohol use: Not Currently   Drug use: Not Currently    Types: Cocaine   Sexual activity: Yes    Birth control/protection: Surgical  Other Topics Concern   Not on file  Social History Narrative   Not on file   Social Drivers of Health   Financial Resource Strain: Not on file  Food Insecurity: Patient Unable To Answer (12/09/2023)   Hunger Vital Sign    Worried About Running Out of Food in the Last Year: Patient unable to answer    Ran Out of Food in the Last Year: Patient unable to answer  Recent Concern: Food Insecurity - Food Insecurity Present (09/26/2023)   Hunger Vital Sign    Worried About Running Out of Food in the Last Year: Often true    Ran Out of Food in the Last Year: Sometimes true  Transportation Needs: Patient Unable To Answer (12/09/2023)   PRAPARE - Transportation    Lack of Transportation (Medical): Patient unable to answer    Lack of Transportation (Non-Medical): Patient unable to answer  Recent Concern: Transportation Needs - Unmet Transportation Needs (10/11/2023)   PRAPARE - Administrator, Civil Service (Medical): Yes    Lack of Transportation (Non-Medical): Patient unable to answer  Physical  Activity: Not on file  Stress: Not on file  Social Connections: Not on file  Intimate Partner Violence: Patient Unable To Answer (12/09/2023)   Humiliation, Afraid, Rape, and Kick questionnaire    Fear of Current or Ex-Partner: Patient unable to answer    Emotionally Abused: Patient unable to answer    Physically Abused: Patient unable to answer    Sexually Abused: Patient unable to answer     Current Outpatient Medications:    acetaminophen  (TYLENOL ) 325 MG tablet, Take 2 tablets (650 mg total) by mouth every 6 (six) hours as needed  for mild pain (pain score 1-3), fever, headache or moderate pain (pain score 4-6)., Disp: 30 tablet, Rfl: 2   ascorbic acid  (VITAMIN C ) 500 MG tablet, Take 1 tablet (500 mg total) by mouth daily., Disp: 30 tablet, Rfl: 2   atovaquone  (MEPRON ) 750 MG/5ML suspension, Take 10 mLs (1,500 mg total) by mouth daily with breakfast., Disp: 300 mL, Rfl: 11   bisacodyl  (DULCOLAX) 5 MG EC tablet, Take 2 tablets (10 mg total) by mouth daily as needed for severe constipation., Disp: 30 tablet, Rfl: 0   cyanocobalamin  1000 MCG tablet, Take 1 tablet (1,000 mcg total) by mouth daily., Disp: 30 tablet, Rfl: 2   diclofenac  Sodium (VOLTAREN ) 1 % GEL, Apply 2 g topically 3 (three) times daily as needed (pain in right hip/thigh and lower back)., Disp: 100 g, Rfl: 0   dolutegravir -lamiVUDine  (DOVATO ) 50-300 MG tablet, Take 1 tablet by mouth daily. Take at least 2 hours prior to iron  and magnesium  oxide., Disp: 30 tablet, Rfl: 11   DULoxetine  (CYMBALTA ) 60 MG capsule, Take 1 capsule (60 mg total) by mouth daily., Disp: 30 capsule, Rfl: 0   gabapentin  (NEURONTIN ) 300 MG capsule, Take 1 capsule (300 mg total) by mouth at bedtime., Disp: 30 capsule, Rfl: 0   hydrOXYzine  (ATARAX ) 25 MG tablet, Take 1 tablet (25 mg total) by mouth 3 (three) times daily as needed for anxiety., Disp: 30 tablet, Rfl: 0   iron  polysaccharides (NIFEREX) 150 MG capsule, Take 1 capsule (150 mg total) by mouth daily., Disp: 30 capsule, Rfl: 2   midodrine  (PROAMATINE ) 5 MG tablet, Take 2 tablets (10 mg total) by mouth 3 (three) times daily as needed (SBP <110)., Disp: 30 tablet, Rfl: 0   pantoprazole  (PROTONIX ) 40 MG tablet, Take 1 tablet (40 mg total) by mouth daily., Disp: 30 tablet, Rfl: 2   polyethylene glycol powder (GLYCOLAX /MIRALAX ) 17 GM/SCOOP powder, Take 17 g by mouth daily as needed for moderate constipation., Disp: 238 g, Rfl: 0   potassium chloride  SA (KLOR-CON  M) 20 MEQ tablet, Take 2 tablets daily for 3 days THEN take 1 tablet (20 mEq total)  by mouth every Monday, Wednesday, and Friday at 8 PM., Disp: 30 tablet, Rfl: 0   potassium chloride  SA (KLOR-CON  M) 20 MEQ tablet, Take 2 tablets (40 mEq total) by mouth daily for 3 days., Disp: 6 tablet, Rfl: 0   predniSONE  (DELTASONE ) 50 MG tablet, Is a 1 tablet with breakfast, Disp: 5 tablet, Rfl: 0   torsemide  (DEMADEX ) 20 MG tablet, Take 2 tablets daily for 3 days THEN 1 tablet (20 mg total) by mouth every Monday, Wednesday, and Friday at 8 PM., Disp: 30 tablet, Rfl: 0   Torsemide  40 MG TABS, Take 40 mg by mouth daily for 3 days., Disp: 3 tablet, Rfl: 0     ROS:  Review of Systems BREAST: No symptoms    Objective: There were no vitals taken  for this visit.   OBGyn Exam  Results: No results found for this or any previous visit (from the past 24 hours).  Assessment/Plan:  No diagnosis found.   No orders of the defined types were placed in this encounter.           GYN counsel {counseling: 16159}    F/U  No follow-ups on file.  Aftyn Nott B. Hazelyn Kallen, PA-C 06/16/2024 4:47 PM

## 2024-06-16 NOTE — Discharge Instructions (Signed)
 Take ibuprofen  or Aleve  for pain.  Follow up with primary care as needed.

## 2024-06-16 NOTE — ED Triage Notes (Signed)
 Pt arrives via [facility] from [facility] with c/o right sided flank pain that started last night. Pt states that it hurts worse when you push on the area. Pt denies N/V. Pt has a little pain when having a BM, but no pain/burning when they urinate. Pt A&Ox4 during triage.

## 2024-06-17 ENCOUNTER — Other Ambulatory Visit: Payer: Self-pay

## 2024-06-17 ENCOUNTER — Ambulatory Visit: Payer: MEDICAID | Admitting: Obstetrics and Gynecology

## 2024-06-17 ENCOUNTER — Telehealth: Payer: Self-pay

## 2024-06-17 ENCOUNTER — Other Ambulatory Visit (HOSPITAL_COMMUNITY)
Admission: RE | Admit: 2024-06-17 | Discharge: 2024-06-17 | Disposition: A | Discharge status: Home / Self Care | Payer: MEDICAID | Source: Ambulatory Visit | Attending: Obstetrics and Gynecology | Admitting: Obstetrics and Gynecology

## 2024-06-17 ENCOUNTER — Encounter: Payer: Self-pay | Admitting: Obstetrics and Gynecology

## 2024-06-17 VITALS — BP 110/69 | HR 80 | Ht 68.0 in | Wt 249.0 lb

## 2024-06-17 DIAGNOSIS — Z124 Encounter for screening for malignant neoplasm of cervix: Secondary | ICD-10-CM | POA: Diagnosis present

## 2024-06-17 DIAGNOSIS — Z1211 Encounter for screening for malignant neoplasm of colon: Secondary | ICD-10-CM

## 2024-06-17 DIAGNOSIS — Z01411 Encounter for gynecological examination (general) (routine) with abnormal findings: Secondary | ICD-10-CM | POA: Diagnosis not present

## 2024-06-17 DIAGNOSIS — K921 Melena: Secondary | ICD-10-CM | POA: Diagnosis not present

## 2024-06-17 DIAGNOSIS — B20 Human immunodeficiency virus [HIV] disease: Secondary | ICD-10-CM

## 2024-06-17 DIAGNOSIS — Z1231 Encounter for screening mammogram for malignant neoplasm of breast: Secondary | ICD-10-CM

## 2024-06-17 DIAGNOSIS — Z1151 Encounter for screening for human papillomavirus (HPV): Secondary | ICD-10-CM

## 2024-06-17 DIAGNOSIS — Z01419 Encounter for gynecological examination (general) (routine) without abnormal findings: Secondary | ICD-10-CM

## 2024-06-17 DIAGNOSIS — Z1272 Encounter for screening for malignant neoplasm of vagina: Secondary | ICD-10-CM

## 2024-06-17 MED ORDER — PEG 3350-KCL-NA BICARB-NACL 420 G PO SOLR: 4000.0000 mL | Freq: Once | ORAL | 0 refills | Status: AC

## 2024-06-17 NOTE — Patient Instructions (Signed)
 I value your feedback and you entrusting [place] with your care. If you get a [facility] patient survey, I would appreciate you taking the time to let us know about your experience today. Thank you!  [facility] ([place])--[phone]

## 2024-06-17 NOTE — Telephone Encounter (Signed)
 Gastroenterology Pre-Procedure Review  [facility] Fax [phone]  Request Date: 09/25/24 Requesting Physician: Dr. Jinny  PATIENT REVIEW QUESTIONS: The patient gave permission for [facility] (Care coordinator at [facility]) to respond to the following health history questions as indicated:    1. Are you having any GI issues? no 2. Do you have a personal history of Polyps? no 3. Do you have a family history of Colon Cancer or Polyps? no 4. Diabetes Mellitus? no 5. Joint replacements in the past 12 months?no 6. Major health problems in the past 3 months?no 7. Any artificial heart valves, MVP, or defibrillator?no    MEDICATIONS & ALLERGIES:    Patient reports the following regarding taking any anticoagulation/antiplatelet therapy:   Plavix, Coumadin, Eliquis, Xarelto, Lovenox , Pradaxa, Brilinta, or Effient? no Aspirin? no  Patient confirms/reports the following medications:  Current Outpatient Medications  Medication Sig Dispense Refill   acetaminophen  (TYLENOL ) 325 MG tablet Take 2 tablets (650 mg total) by mouth every 6 (six) hours as needed for mild pain (pain score 1-3), fever, headache or moderate pain (pain score 4-6). 30 tablet 2   ascorbic acid  (VITAMIN C ) 500 MG tablet Take 1 tablet (500 mg total) by mouth daily. 30 tablet 2   atovaquone  (MEPRON ) 750 MG/5ML suspension Take 10 mLs (1,500 mg total) by mouth daily with breakfast. 300 mL 11   bisacodyl  (DULCOLAX) 5 MG EC tablet Take 2 tablets (10 mg total) by mouth daily as needed for severe constipation. 30 tablet 0   cyanocobalamin  1000 MCG tablet Take 1 tablet (1,000 mcg total) by mouth daily. 30 tablet 2   diclofenac  Sodium (VOLTAREN ) 1 % GEL Apply 2 g topically 3 (three) times daily as needed (pain in right hip/thigh and lower back). 100 g 0   dolutegravir -lamiVUDine  (DOVATO ) 50-300 MG tablet Take 1 tablet by mouth daily. Take at least 2 hours prior to iron  and magnesium  oxide. 30 tablet 11   DULoxetine   (CYMBALTA ) 60 MG capsule Take 1 capsule (60 mg total) by mouth daily. 30 capsule 0   gabapentin  (NEURONTIN ) 300 MG capsule Take 1 capsule (300 mg total) by mouth at bedtime. 30 capsule 0   hydrOXYzine  (ATARAX ) 25 MG tablet Take 1 tablet (25 mg total) by mouth 3 (three) times daily as needed for anxiety. 30 tablet 0   iron  polysaccharides (NIFEREX) 150 MG capsule Take 1 capsule (150 mg total) by mouth daily. 30 capsule 2   lidocaine  4 % SMARTSIG:Topical     midodrine  (PROAMATINE ) 5 MG tablet Take 2 tablets (10 mg total) by mouth 3 (three) times daily as needed (SBP <110). 30 tablet 0   mupirocin ointment (BACTROBAN) 2 % SMARTSIG:1 Application Topical 2-3 Times Daily     naproxen  (NAPROSYN ) 500 MG tablet Take 500 mg by mouth 2 (two) times daily.     pantoprazole  (PROTONIX ) 40 MG tablet Take 1 tablet (40 mg total) by mouth daily. 30 tablet 2   polyethylene glycol powder (GLYCOLAX /MIRALAX ) 17 GM/SCOOP powder Take 17 g by mouth daily as needed for moderate constipation. 238 g 0   potassium chloride  SA (KLOR-CON  M) 20 MEQ tablet Take 2 tablets daily for 3 days THEN take 1 tablet (20 mEq total) by mouth every Monday, Wednesday, and Friday at 8 PM. 30 tablet 0   predniSONE  (DELTASONE ) 50 MG tablet Is a 1 tablet with breakfast 5 tablet 0   torsemide  (DEMADEX ) 20 MG tablet Take 2 tablets daily for 3 days THEN 1 tablet (20 mg  total) by mouth every Monday, Wednesday, and Friday at 8 PM. 30 tablet 0   traMADol  (ULTRAM ) 50 MG tablet Take 50 mg by mouth 2 (two) times daily as needed.     No current facility-administered medications for this visit.    Patient confirms/reports the following allergies:  Allergies  Allergen Reactions   Fish Allergy Other (See Comments)    Crab legs result in itching  Crab legs result in itching  Crab legs result in itching   Shellfish Allergy Other (See Comments)    Crab legs result in itching   Buprenorphine Hcl     Pt states she not allergic   Morphine  And Codeine  Itching    hives   Sulfa  Antibiotics Rash    No orders of the defined types were placed in this encounter.   AUTHORIZATION INFORMATION Primary Insurance: 1D#: Group #:  Secondary Insurance: 1D#: Group #:  SCHEDULE INFORMATION: Date: 09/25/24 Time: Location: [facility]

## 2024-06-20 LAB — CYTOLOGY - PAP
Adequacy: ABSENT
Comment: NEGATIVE
Diagnosis: NEGATIVE
High risk HPV: NEGATIVE

## 2024-07-07 NOTE — Progress Notes (Signed)
 This note has been created using automated tools and reviewed for accuracy by [facility].  Chief Complaint  Patient presents with  . Left Knee - Pain    Subjective  Meghan Jarvis is a 48 y.o. female who presents for Pain of the Left Knee HPI History of Present Illness Meghan Jarvis is a 48 year old female who presents with left knee pain for further evaluation.  She has chronic left knee pain that significantly worsened, leading to an emergency room visit on May 22, 2024. At that time, she was provided with a knee brace, which has not alleviated her symptoms. This is her first visit for this issue.  The knee pain began prior to the emergency room visit, but she is unsure of the exact onset or cause. The pain radiates to her lower back, and she has a history of visiting the ER for back pain as well. She experiences knee swelling and reports that her knee sometimes 'pops' and gives out, causing her to stop walking due to fatigue. She is unable to walk far.  She currently uses meloxicam , but the dosage and frequency were not specified. She resides in an assisted living facility and has a history of a broken arm, for which she has metal hardware in place.  No previous knee injuries in her youth and not diabetic, although there are concerns about high blood sugar levels. The knee pain is most bothersome when walking, and she needs to sit down quickly due to lower back pain.  Review of Systems  Patient Active Problem List  Diagnosis  . Alcohol abuse  . Asthma (HHS-HCC)  . Atrial fibrillation, new onset (CMS/HHS-HCC)  . 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 acetaminophen  (TYLENOL ) 325 MG tablet Take 650 mg by mouth every 6 (six) hours as needed for Pain    . ascorbic acid , vitamin C , (VITAMIN C ) 500 MG tablet Take 500 mg by mouth once daily    .  atovaquone  (MEPRON ) 750 mg/5 mL suspension Take 1,500 mg by mouth daily with breakfast    . diclofenac  (VOLTAREN ) 1 % topical gel Apply 2 g topically 3 (three) times daily    . folic acid  (FOLVITE ) 1 MG tablet Take 1 mg by mouth once daily    . hydrOXYzine  (ATARAX ) 25 MG tablet Take 25 mg by mouth 3 (three) times daily as needed for Anxiety    . iron  polysaccharides (FERREX) 150 mg iron  capsule Take 150 mg by mouth once daily    . midodrine  (PROAMATINE ) 5 MG tablet Take 10 mg by mouth 3 (three) times daily    . pantoprazole  (PROTONIX ) 40 MG DR tablet Take 40 mg by mouth once daily    . polyethylene glycol (MIRALAX ) powder Take 17 g by mouth once daily    . potassium chloride  (KLOR-CON  M20) 20 MEQ ER tablet Take 20 mEq by mouth as directed    . TORsemide  (DEMADEX ) 20 MG tablet Take 20 mg by mouth as directed    . traMADoL  (ULTRAM ) 50 mg tablet Take 50 mg by mouth 2 (two) times daily as needed    . atazanavir -cobicistat  (EVOTAZ ) 300-150 mg tablet Take by mouth.    . calcium  polycarbophil (FIBERCON) 625 mg tablet Take by mouth.    . clobetasol  (TEMOVATE ) 0.05 % ointment APPLY TOPICALLY TWO (2) TIMES A DAY.    . DULoxetine  (CYMBALTA ) 60 MG  DR capsule Take 60 mg by mouth once daily.    . emtricitabine -tenofovir  alafenamide (DESCOVY ) 200-25 mg tablet Take by mouth.    . gabapentin  (NEURONTIN ) 100 MG capsule Take by mouth.    . meloxicam  (MOBIC ) 7.5 MG tablet Take by mouth.    . naproxen  (NAPROSYN ) 500 MG tablet Take 500 mg by mouth 2 (two) times daily with meals.    . ondansetron  (ZOFRAN -ODT) 8 MG disintegrating tablet Take by mouth.    . tiZANidine  (ZANAFLEX ) 2 MG tablet Take by mouth.    . traZODone  (DESYREL ) 100 MG tablet Take by mouth.     No facility-administered medications prior to visit.      Objective  Vitals:   07/07/24 0858  BP: 116/80  Weight: (!) 112 kg (247 lb)  Height: 172.7 cm (5' 8)  PainSc: 10-Worst pain ever  PainLoc: Knee   Body mass index is 37.56 kg/m.  Home  Vitals:     Physical Exam Physical Exam MUSCULOSKELETAL: There is a 2 cm swollen area on the anterior lateral knee adjacent to the patellar tendon with effusion. Lachman test shows some laxity, stable to varus and valgus stress. Knee extension lacks 5 degrees, flexion to 110 degrees.  Constitutional: alert, in NAD, and communicates well   Results RADIOLOGY Knee X-ray: Osteoarthritis present (05/22/2024)     Assessment/Plan:   Assessment & Plan Left knee pain with effusion and arthritis   Chronic left knee pain with effusion and arthritis was initially evaluated on May 22, 2024. Pain persists and worsens despite using a knee brace. Examination reveals 2 cm swelling anterolateral to the patellar tendon with effusion, some laxity on Lachman test, but stability to varus and valgus stress. X-rays confirm arthritis. She reports increased pain with ambulation, occasional knee popping, and giving way. She resides in assisted living and has high blood sugar levels but denies diabetes. Administer a cortisone injection to the left knee with 1 cc betamethasone and 2 cc Marcaine. Advise monitoring symptoms for two weeks post-injection. If symptoms do not improve, schedule a follow-up for standing knee x-rays and consider an MRI if necessary.  Chronic pain   Chronic pain primarily affects her left knee, worsened by ambulation and associated with fatigue. She also reports lower back pain contributing to discomfort and mobility issues. Continue the current pain management regimen, including meloxicam . Evaluate the response to the cortisone injection in the knee for potential reduction in chronic pain. Diagnoses and all orders for this visit:  Primary localized osteoarthritis of left knee -     BUPivacaine HCl (MARCAINE) 0.5 % injection 2 mL -     betamethasone acetate-betamethasone sodium phosphate  (CELESTONE) injection 6 mg  Uncontrolled type 2 diabetes mellitus with hypoglycemia, without long-term  current use of insulin  (CMS/HHS-HCC)        This visit was coded based on medical decision making (MDM).           Future Appointments   This patient does not currently have any appointments scheduled.     There are no Patient Instructions on file for this visit.  An after visit summary was provided for the patient either in written format (printed) or through [facility].  This note has been created using automated tools and reviewed for accuracy by [facility].

## 2024-07-12 ENCOUNTER — Other Ambulatory Visit: Payer: Self-pay

## 2024-07-12 ENCOUNTER — Observation Stay
Admission: EM | Admit: 2024-07-12 | Discharge: 2024-07-13 | Disposition: A | Discharge status: Home / Self Care | Payer: MEDICAID | Attending: Obstetrics and Gynecology | Admitting: Obstetrics and Gynecology

## 2024-07-12 ENCOUNTER — Emergency Department: Payer: MEDICAID

## 2024-07-12 DIAGNOSIS — I5030 Unspecified diastolic (congestive) heart failure: Secondary | ICD-10-CM | POA: Diagnosis not present

## 2024-07-12 DIAGNOSIS — F1410 Cocaine abuse, uncomplicated: Secondary | ICD-10-CM | POA: Diagnosis present

## 2024-07-12 DIAGNOSIS — R531 Weakness: Principal | ICD-10-CM

## 2024-07-12 DIAGNOSIS — E1165 Type 2 diabetes mellitus with hyperglycemia: Principal | ICD-10-CM | POA: Insufficient documentation

## 2024-07-12 DIAGNOSIS — J45909 Unspecified asthma, uncomplicated: Secondary | ICD-10-CM | POA: Diagnosis not present

## 2024-07-12 DIAGNOSIS — E66812 Obesity, class 2: Secondary | ICD-10-CM | POA: Diagnosis present

## 2024-07-12 DIAGNOSIS — Z5900 Homelessness unspecified: Secondary | ICD-10-CM | POA: Insufficient documentation

## 2024-07-12 DIAGNOSIS — R739 Hyperglycemia, unspecified: Secondary | ICD-10-CM | POA: Insufficient documentation

## 2024-07-12 DIAGNOSIS — M62838 Other muscle spasm: Secondary | ICD-10-CM

## 2024-07-12 DIAGNOSIS — E119 Type 2 diabetes mellitus without complications: Secondary | ICD-10-CM

## 2024-07-12 DIAGNOSIS — Z21 Asymptomatic human immunodeficiency virus [HIV] infection status: Secondary | ICD-10-CM | POA: Diagnosis present

## 2024-07-12 DIAGNOSIS — I5189 Other ill-defined heart diseases: Secondary | ICD-10-CM

## 2024-07-12 LAB — CBC WITH DIFFERENTIAL/PLATELET
Abs Immature Granulocytes: 0.09 K/uL — ABNORMAL HIGH (ref 0.00–0.07)
Basophils Absolute: 0 K/uL (ref 0.0–0.1)
Basophils Relative: 0 %
Eosinophils Absolute: 0 K/uL (ref 0.0–0.5)
Eosinophils Relative: 0 %
HCT: 35.6 % — ABNORMAL LOW (ref 36.0–46.0)
Hemoglobin: 10.9 g/dL — ABNORMAL LOW (ref 12.0–15.0)
Immature Granulocytes: 1 %
Lymphocytes Relative: 12 %
Lymphs Abs: 1.2 K/uL (ref 0.7–4.0)
MCH: 25.5 pg — ABNORMAL LOW (ref 26.0–34.0)
MCHC: 30.6 g/dL (ref 30.0–36.0)
MCV: 83.2 fL (ref 80.0–100.0)
Monocytes Absolute: 0.3 K/uL (ref 0.1–1.0)
Monocytes Relative: 3 %
Neutro Abs: 8.1 K/uL — ABNORMAL HIGH (ref 1.7–7.7)
Neutrophils Relative %: 84 %
Platelets: 349 K/uL (ref 150–400)
RBC: 4.28 MIL/uL (ref 3.87–5.11)
RDW: 17 % — ABNORMAL HIGH (ref 11.5–15.5)
WBC: 9.7 K/uL (ref 4.0–10.5)
nRBC: 0 % (ref 0.0–0.2)

## 2024-07-12 LAB — GLUCOSE, CAPILLARY
Glucose-Capillary: 395 mg/dL — ABNORMAL HIGH (ref 70–99)
Glucose-Capillary: 552 mg/dL (ref 70–99)
Glucose-Capillary: 116 mg/dL — ABNORMAL HIGH (ref 70–99)

## 2024-07-12 LAB — URINALYSIS, ROUTINE W REFLEX MICROSCOPIC
Bilirubin Urine: NEGATIVE
Glucose, UA: NEGATIVE mg/dL
Hgb urine dipstick: NEGATIVE
Ketones, ur: NEGATIVE mg/dL
Nitrite: NEGATIVE
Protein, ur: >=300 mg/dL — AB
Specific Gravity, Urine: 1.023 (ref 1.005–1.030)
pH: 5 (ref 5.0–8.0)

## 2024-07-12 LAB — COMPREHENSIVE METABOLIC PANEL WITH GFR
ALT: 22 U/L (ref 0–44)
AST: 29 U/L (ref 15–41)
Albumin: 3.3 g/dL — ABNORMAL LOW (ref 3.5–5.0)
Alkaline Phosphatase: 100 U/L (ref 38–126)
Anion gap: 13 (ref 5–15)
BUN: 24 mg/dL — ABNORMAL HIGH (ref 6–20)
CO2: 23 mmol/L (ref 22–32)
Calcium: 9.3 mg/dL (ref 8.9–10.3)
Chloride: 105 mmol/L (ref 98–111)
Creatinine, Ser: 1.22 mg/dL — ABNORMAL HIGH (ref 0.44–1.00)
GFR, Estimated: 54 mL/min — ABNORMAL LOW (ref >60)
Glucose, Bld: 304 mg/dL — ABNORMAL HIGH (ref 70–99)
Potassium: 4.2 mmol/L (ref 3.5–5.1)
Sodium: 141 mmol/L (ref 135–145)
Total Bilirubin: 0.5 mg/dL (ref 0.0–1.2)
Total Protein: 6.8 g/dL (ref 6.5–8.1)

## 2024-07-12 LAB — MAGNESIUM: Magnesium: 2.1 mg/dL (ref 1.7–2.4)

## 2024-07-12 LAB — LACTIC ACID, PLASMA: Lactic Acid, Venous: 1.8 mmol/L (ref 0.5–1.9)

## 2024-07-12 LAB — TROPONIN I (HIGH SENSITIVITY)
Troponin I (High Sensitivity): 5 ng/L (ref <18)
Troponin I (High Sensitivity): 5 ng/L (ref <18)

## 2024-07-12 LAB — PHOSPHORUS: Phosphorus: 3.4 mg/dL (ref 2.5–4.6)

## 2024-07-12 LAB — URIC ACID: Uric Acid, Serum: 5 mg/dL (ref 2.5–7.1)

## 2024-07-12 LAB — CK: Total CK: 169 U/L (ref 38–234)

## 2024-07-12 MED ORDER — ACETAMINOPHEN 325 MG PO TABS
650.0000 mg | ORAL_TABLET | Freq: Four times a day (QID) | ORAL | Status: DC | PRN
  Administered 2024-07-12: 650 mg via ORAL
  Filled 2024-07-12: qty 2

## 2024-07-12 MED ORDER — ONDANSETRON HCL 4 MG PO TABS: 4.0000 mg | ORAL_TABLET | Freq: Four times a day (QID) | ORAL | Status: DC | PRN

## 2024-07-12 MED ORDER — SODIUM CHLORIDE 0.45 % IV SOLN: INTRAVENOUS | Status: AC

## 2024-07-12 MED ORDER — GABAPENTIN 300 MG PO CAPS
300.0000 mg | ORAL_CAPSULE | Freq: Every day | ORAL | Status: DC
Start: 2024-07-12 — End: 2024-07-13
  Administered 2024-07-12: 300 mg via ORAL
  Filled 2024-07-12: qty 1

## 2024-07-12 MED ORDER — INSULIN ASPART 100 UNIT/ML IJ SOLN: 0.0000 [IU] | Freq: Three times a day (TID) | INTRAMUSCULAR | Status: DC

## 2024-07-12 MED ORDER — SODIUM CHLORIDE 0.9 % IV BOLUS: 500.0000 mL | Freq: Once | INTRAVENOUS | Status: DC

## 2024-07-12 MED ORDER — PANTOPRAZOLE SODIUM 40 MG PO TBEC
40.0000 mg | DELAYED_RELEASE_TABLET | Freq: Every day | ORAL | Status: DC
  Administered 2024-07-12 – 2024-07-13 (×2): 40 mg via ORAL
  Filled 2024-07-12 (×2): qty 1

## 2024-07-12 MED ORDER — ENOXAPARIN SODIUM 60 MG/0.6ML IJ SOSY
55.0000 mg | PREFILLED_SYRINGE | INTRAMUSCULAR | Status: DC
  Administered 2024-07-12: 55 mg via SUBCUTANEOUS
  Filled 2024-07-12: qty 0.6

## 2024-07-12 MED ORDER — OXYCODONE HCL 5 MG PO TABS
5.0000 mg | ORAL_TABLET | Freq: Once | ORAL | Status: AC
  Administered 2024-07-12: 5 mg via ORAL
  Filled 2024-07-12: qty 1

## 2024-07-12 MED ORDER — HYDROXYZINE HCL 25 MG PO TABS: 25.0000 mg | ORAL_TABLET | Freq: Three times a day (TID) | ORAL | Status: DC | PRN

## 2024-07-12 MED ORDER — DULOXETINE HCL 30 MG PO CPEP
60.0000 mg | ORAL_CAPSULE | Freq: Every day | ORAL | Status: DC
  Administered 2024-07-12 – 2024-07-13 (×2): 60 mg via ORAL
  Filled 2024-07-12 (×2): qty 2

## 2024-07-12 MED ORDER — INSULIN ASPART 100 UNIT/ML IJ SOLN
0.0000 [IU] | INTRAMUSCULAR | Status: DC
  Administered 2024-07-12: 20 [IU] via SUBCUTANEOUS
  Administered 2024-07-13: 4 [IU] via SUBCUTANEOUS
  Filled 2024-07-12 (×2): qty 1

## 2024-07-12 MED ORDER — TRAMADOL HCL 50 MG PO TABS
50.0000 mg | ORAL_TABLET | Freq: Four times a day (QID) | ORAL | Status: DC | PRN
  Administered 2024-07-12: 50 mg via ORAL
  Filled 2024-07-12: qty 1

## 2024-07-12 MED ORDER — ONDANSETRON HCL 4 MG/2ML IJ SOLN: 4.0000 mg | Freq: Four times a day (QID) | INTRAMUSCULAR | Status: DC | PRN

## 2024-07-12 MED ORDER — SODIUM CHLORIDE 0.9 % IV BOLUS
1000.0000 mL | Freq: Once | INTRAVENOUS | Status: AC
  Administered 2024-07-12: 1000 mL via INTRAVENOUS

## 2024-07-12 MED ORDER — ACETAMINOPHEN 650 MG RE SUPP: 650.0000 mg | Freq: Four times a day (QID) | RECTAL | Status: DC | PRN

## 2024-07-12 MED ORDER — METFORMIN HCL 500 MG PO TABS
500.0000 mg | ORAL_TABLET | Freq: Every day | ORAL | Status: DC
  Administered 2024-07-13: 500 mg via ORAL
  Filled 2024-07-12: qty 1

## 2024-07-12 MED ORDER — DOLUTEGRAVIR-LAMIVUDINE 50-300 MG PO TABS: 1.0000 | ORAL_TABLET | Freq: Every day | ORAL | Status: DC

## 2024-07-12 NOTE — ED Provider Notes (Signed)
 I was asked to see this patient by the APP as she plans to admit this patient.  Patient has new AKI and extremely elevated glucose despite never being diagnosed with diabetes.  Of note patient does have right-sided abdominal pain that she states has been present for years.  Abdomen is soft she is very mildly tender in the right upper quadrant but no evidence of peritonitis and she has no elevated liver function tests.  I suspect that she may have an element of biliary colic as well. Agree with APP's plan at this time, especially given the patient's social situation may be complicated as well   Nicholaus Rolland BRAVO, MD 07/12/24 1219

## 2024-07-12 NOTE — ED Provider Notes (Signed)
 [facility] Provider Note    Event Date/Time   First MD Initiated Contact with Patient 07/12/24 1034     (approximate)   History   Spasms   HPI  Meghan Jarvis is a 48 y.o. female history of HIV, substance use, asthma presents emergency department from a group home.  She states that [place] year.  Complaining of muscle cramping that started last night and has worsened today.  Did drink juice this morning.  No history of diabetes.  States she has been extra thirsty and urinating more frequently.  Has not been out in the heat.  No known injury.      Physical Exam   Triage Vital Signs: ED Triage Vitals  Encounter Vitals Group     BP 07/12/24 1035 112/74     Girls Systolic BP Percentile --      Girls Diastolic BP Percentile --      Boys Systolic BP Percentile --      Boys Diastolic BP Percentile --      Pulse Rate 07/12/24 1035 92     Resp 07/12/24 1035 17     Temp 07/12/24 1034 99.2 F (37.3 C)     Temp Source 07/12/24 1034 Oral     SpO2 07/12/24 1035 97 %     Weight 07/12/24 1035 257 lb (116.6 kg)     Height 07/12/24 1035 5' 8 (1.727 m)     Head Circumference --      Peak Flow --      Pain Score 07/12/24 1035 10     Pain Loc --      Pain Education --      Exclude from Growth Chart --     Most recent vital signs: Vitals:   07/12/24 1034 07/12/24 1035  BP:  112/74  Pulse:  92  Resp:  17  Temp: 99.2 F (37.3 C)   SpO2:  97%     General: Awake, no distress.   CV:  Good peripheral perfusion. regular rate and  rhythm Resp:  Normal effort. Lungs cta Abd:  No distention.   Other:  Left knee tender swollen, full range of motion, neurovascular intact   ED Results / Procedures / Treatments   Labs (all labs ordered are listed, but only abnormal results are displayed) Labs Reviewed  CBC WITH DIFFERENTIAL/PLATELET - Abnormal; Notable for the following components:      Result Value   Hemoglobin 10.9 (*)    HCT 35.6 (*)    MCH 25.5  (*)    RDW 17.0 (*)    Neutro Abs 8.1 (*)    Abs Immature Granulocytes 0.09 (*)    All other components within normal limits  COMPREHENSIVE METABOLIC PANEL WITH GFR - Abnormal; Notable for the following components:   Glucose, Bld 304 (*)    BUN 24 (*)    Creatinine, Ser 1.22 (*)    Albumin 3.3 (*)    GFR, Estimated 54 (*)    All other components within normal limits  CK  URIC ACID  LACTIC ACID, PLASMA  MAGNESIUM   URINALYSIS, ROUTINE W REFLEX MICROSCOPIC  HEMOGLOBIN A1C  PHOSPHORUS  TROPONIN I (HIGH SENSITIVITY)  TROPONIN I (HIGH SENSITIVITY)     EKG  EKG   RADIOLOGY Chest x-ray, left knee    PROCEDURES:   Procedures  Critical Care:  no Chief Complaint  Patient presents with   Spasms      MEDICATIONS ORDERED IN ED: Medications  sodium chloride  0.9 % bolus 1,000 mL ( Intravenous Restarted 07/12/24 1318)  oxyCODONE  (Oxy IR/ROXICODONE ) immediate release tablet 5 mg (5 mg Oral Given 07/12/24 1257)     IMPRESSION / MDM / ASSESSMENT AND PLAN / ED COURSE  I reviewed the triage vital signs and the nursing notes.                              Differential diagnosis includes, but is not limited to, weakness, new onset diabetes, metabolic disorder, sepsis, aki, mi, anemia, CAP, AIDS, worsening HIV viral load, cellulitis of the left knee  Patient's presentation is most consistent with acute illness / injury with system symptoms.   Labs are concerning for new onset of diabetes with a glucose of 304, BUN and creatinine are also elevated indicating a mild AKI, hemoglobin is decreased at 10.9 indicating anemia but however this is in her normal trend via her old charts.  Troponin and CK were reassuring.  Magnesium  reassuring  Chest x-ray independently reviewed interpreted by me as being negative for any acute abnormality  X-ray left knee independently reviewed interpreted by me as being negative for acute abnormality  EKG shows normal sinus rhythm with 82 bpm, see  physician read  Due to the new onset of diabetes, weakness, muscle spasms and pain we will go ahead and have the hospitalist admit to the hospital   Patient is in agreement treatment plan.  She is stable at this time.        FINAL CLINICAL IMPRESSION(S) / ED DIAGNOSES   Final diagnoses:  Generalized weakness  Muscle spasm  New onset type 2 diabetes mellitus (HCC)     Rx / DC Orders   ED Discharge Orders     None        Note:  This document was prepared using Dragon voice recognition software and may include unintentional dictation errors.    Gasper Devere ORN, PA-C 07/12/24 1335    Suzanne Kirsch, MD 07/12/24 [id number]

## 2024-07-12 NOTE — H&P (Signed)
 History and Physical    Patient: Meghan Jarvis FMW:[id number] DOB: 25-Sep-1976 DOA: 07/12/2024 DOS: the patient was seen and examined on 07/12/2024 [name]  Patient coming from: Home  Chief Complaint:  Chief Complaint  Patient presents with   Spasms   HPI: Meghan Jarvis is a 48 y.o. female with medical history significant of class II obesity, asthma, depression, HIV infection, history of AIDS, history of alcohol abuse, history of cocaine use, tobacco abuse who presented to the emergency department with complaints of bilateral thigh cramping since last night.  These got worse this morning to the point that she has been unable to walk without difficulty today.  She has been having polyuria, polydipsia and blurred vision.  The patient stated that she eats a lot of sweets.  She ate a cookie after coming from the emergency department which increased her glucose to 552 mg/dL.  She still smokes, but her alcohol use is in remission for several years according to the patient.  She denied fever, chills, rhinorrhea, sore throat, wheezing or hemoptysis.  No chest pain, palpitations, diaphoresis, PND, orthopnea or pitting edema of the lower extremities.  She has occasional diarrhea, but no abdominal pain, nausea, emesis, constipation, melena or hematochezia.  No flank pain, dysuria, frequency or hematuria.   Lab work: CBC showed white count of 9.7, hemoglobin 10.9 g/dL platelets 650.  Total CK, troponin x 2 uric acid, lactic acid, magnesium  and phosphorus level were normal.  CMP showed an albumin of 3.3 g/dL, glucose of 695, BUN 24 and creatinine 1.22 mg/dL.  The rest of the CMP measurements were normal.  Imaging: 2 view x-ray of the chest with no active process.  4 view left knee x-ray showing progressive medial compartment degenerative joint disease without acute findings.   ED course: Initial vital signs were temperature 99.2 F, pulse 92, respirations 17, BP 112/74 mmHg O2 sat 97% on room air.   Patient received oxycodone  5 mg p.o. x 1 dose and 1000 mL of normal saline bolus.  Review of Systems: As mentioned in the history of present illness. All other systems reviewed and are negative.  Past Medical History:  Diagnosis Date   Asthma    Depression    HIV (human immunodeficiency virus infection) (HCC)    Past Surgical History:  Procedure Laterality Date   ABDOMINAL HYSTERECTOMY     APPENDECTOMY     Social History:  reports that she has been smoking cigarettes. She has never used smokeless tobacco. She reports that she does not currently use alcohol. She reports that she does not currently use drugs after having used the following drugs: Cocaine.  Allergies  Allergen Reactions   Fish Allergy Other (See Comments)    Crab legs result in itching  Crab legs result in itching  Crab legs result in itching   Shellfish Allergy Other (See Comments)    Crab legs result in itching   Buprenorphine Hcl     Pt states she not allergic   Morphine  And Codeine Itching    hives   Sulfa  Antibiotics Rash    Family History  Family history unknown: Yes    Prior to Admission medications   Medication Sig Start Date End Date Taking? Authorizing Provider  acetaminophen  (TYLENOL ) 325 MG tablet Take 2 tablets (650 mg total) by mouth every 6 (six) hours as needed for mild pain (pain score 1-3), fever, headache or moderate pain (pain score 4-6). 05/07/24   Von Bellis, MD  ascorbic acid  (  VITAMIN C ) 500 MG tablet Take 1 tablet (500 mg total) by mouth daily. 05/08/24 08/06/24  Von Bellis, MD  atovaquone  (MEPRON ) 750 MG/5ML suspension Take 10 mLs (1,500 mg total) by mouth daily with breakfast. 05/07/24 05/07/25  Von Bellis, MD  bisacodyl  (DULCOLAX) 5 MG EC tablet Take 2 tablets (10 mg total) by mouth daily as needed for severe constipation. 05/07/24   Von Bellis, MD  cyanocobalamin  1000 MCG tablet Take 1 tablet (1,000 mcg total) by mouth daily. 05/08/24 08/06/24  Von Bellis, MD  diclofenac   Sodium (VOLTAREN ) 1 % GEL Apply 2 g topically 3 (three) times daily as needed (pain in right hip/thigh and lower back). 05/07/24   Von Bellis, MD  dolutegravir -lamiVUDine  (DOVATO ) 50-300 MG tablet Take 1 tablet by mouth daily. Take at least 2 hours prior to iron  and magnesium  oxide. 05/07/24 05/07/25  Von Bellis, MD  DULoxetine  (CYMBALTA ) 60 MG capsule Take 1 capsule (60 mg total) by mouth daily. 05/08/24   Von Bellis, MD  gabapentin  (NEURONTIN ) 300 MG capsule Take 1 capsule (300 mg total) by mouth at bedtime. 05/07/24   Von Bellis, MD  hydrOXYzine  (ATARAX ) 25 MG tablet Take 1 tablet (25 mg total) by mouth 3 (three) times daily as needed for anxiety. 05/07/24   Von Bellis, MD  iron  polysaccharides (NIFEREX) 150 MG capsule Take 1 capsule (150 mg total) by mouth daily. 05/08/24 08/06/24  Von Bellis, MD  lidocaine  4 % SMARTSIG:Topical 06/02/24   [provider]  midodrine  (PROAMATINE ) 5 MG tablet Take 2 tablets (10 mg total) by mouth 3 (three) times daily as needed (SBP <110). 05/07/24   Von Bellis, MD  mupirocin ointment (BACTROBAN) 2 % SMARTSIG:1 Application Topical 2-3 Times Daily 06/07/24   [provider]  naproxen  (NAPROSYN ) 500 MG tablet Take 500 mg by mouth 2 (two) times daily. 05/29/24   [provider]  pantoprazole  (PROTONIX ) 40 MG tablet Take 1 tablet (40 mg total) by mouth daily. 05/07/24 08/05/24  Von Bellis, MD  polyethylene glycol powder (GLYCOLAX /MIRALAX ) 17 GM/SCOOP powder Take 17 g by mouth daily as needed for moderate constipation. 05/07/24   Von Bellis, MD  potassium chloride  SA (KLOR-CON  M) 20 MEQ tablet Take 2 tablets daily for 3 days THEN take 1 tablet (20 mEq total) by mouth every Monday, Wednesday, and Friday at 8 PM. 05/12/24   Von Bellis, MD  predniSONE  (DELTASONE ) 50 MG tablet Is a 1 tablet with breakfast 05/22/24   Evans, Alexandra, PA-C  torsemide  (DEMADEX ) 20 MG tablet Take 2 tablets daily for 3 days THEN 1 tablet (20 mg total) by  mouth every Monday, Wednesday, and Friday at 8 PM. 05/12/24   Von Bellis, MD  traMADol  (ULTRAM ) 50 MG tablet Take 50 mg by mouth 2 (two) times daily as needed. 06/03/24   [provider]    Physical Exam: Vitals:   07/12/24 1034 07/12/24 1035  BP:  112/74  Pulse:  92  Resp:  17  Temp: 99.2 F (37.3 C)   TempSrc: Oral   SpO2:  97%  Weight:  116.6 kg  Height:  5' 8 (1.727 m)   Physical Exam Vitals and nursing note reviewed.  Constitutional:      General: She is awake.     Appearance: She is obese. She is ill-appearing.  HENT:     Head: Normocephalic.     Nose: No rhinorrhea.     Mouth/Throat:     Mouth: Mucous membranes are dry.  Eyes:  General: No scleral icterus.    Pupils: Pupils are equal, round, and reactive to light.  Neck:     Vascular: No JVD.  Cardiovascular:     Rate and Rhythm: Normal rate and regular rhythm.     Heart sounds: S1 normal and S2 normal.  Pulmonary:     Effort: Pulmonary effort is normal.     Breath sounds: Normal breath sounds. No wheezing, rhonchi or rales.  Abdominal:     General: Abdomen is protuberant. Bowel sounds are normal. There is no distension.     Palpations: Abdomen is soft.     Tenderness: There is no abdominal tenderness. There is no right CVA tenderness or left CVA tenderness.  Musculoskeletal:     Cervical back: Neck supple.     Right lower leg: No edema.     Left lower leg: No edema.  Skin:    General: Skin is warm and dry.  Neurological:     General: No focal deficit present.     Mental Status: She is alert and oriented to person, place, and time.  Psychiatric:        Mood and Affect: Mood normal.        Behavior: Behavior normal. Behavior is cooperative.     Data Reviewed:  Results are pending, will review when available. 04/09/2024  IMPRESSIONS:   1. Left ventricular ejection fraction, by estimation, is 55 to 60%. The  left ventricle has normal function. The left ventricle has no regional  wall  motion abnormalities. Left ventricular diastolic parameters are  consistent with Grade I diastolic  dysfunction (impaired relaxation).   2. Right ventricular systolic function is normal. The right ventricular  size is normal. There is normal pulmonary artery systolic pressure. The  estimated right ventricular systolic pressure is 24.9 mmHg.   3. The mitral valve is normal in structure. No evidence of mitral valve  regurgitation. No evidence of mitral stenosis.   4. The aortic valve is normal in structure. Aortic valve regurgitation is  not visualized. No aortic stenosis is present.   5. The inferior vena cava is normal in size with greater than 50%  respiratory variability, suggesting right atrial pressure of 3 mmHg.   EKG: Vent. rate 82 BPM PR interval 136 ms QRS duration 72 ms QT/QTcB 348/406 ms P-R-T axes 55 -2 33 Normal sinus rhythm Normal ECG  Assessment and Plan: Principal Problem:   Newly diagnosed diabetes (HCC) Observation/MedSurg Carbohydrate modified Continue IV fluids. CBG monitoring every 4 hours. RI SS every 4 hours.. Consult diabetes coordinator. Not a good candidate for long-term insulin  therapy Transition to oral therapy once glucose controlled. Smoking cessation advised due to potential complications on diabetics.  Active Problems:   HIV (human immunodeficiency virus infection) (HCC) Continue atovaquone  and daily antiretrovirals. Follow-up with infectious diseases as an outpatient.    Class 2 obesity Current BMI 39.08 kg/m. Would benefit from lifestyle modifications. Follow-up closely with PCP and/or bariatric clinic.    Asthma Smoking cessation advised. Albuterol  MDI as needed.    Grade I diastolic dysfunction No signs of decompensation. Needs significant lifestyle modifications.    Homeless Currently living in a group home.      Advance Care Planning:   Code Status: Full Code   Consults:   Family Communication:   Severity of  Illness: The appropriate patient status for this patient is OBSERVATION. Observation status is judged to be reasonable and necessary in order to provide the required intensity of service to ensure the  patient's safety. The patient's presenting symptoms, physical exam findings, and initial radiographic and laboratory data in the context of their medical condition is felt to place them at decreased risk for further clinical deterioration. Furthermore, it is anticipated that the patient will be medically stable for discharge from the hospital within 2 midnights of admission.   Author: Alm Dorn Castor, MD 07/12/2024 1:12 PM  For on call review [email or web address].   This document was prepared using Dragon voice recognition software and may contain some unintended transcription errors.

## 2024-07-12 NOTE — ED Notes (Signed)
 Lab called to add on troponin

## 2024-07-12 NOTE — ED Triage Notes (Signed)
 Pt from [place] w/ c/o bilateral thigh cramping that started last night and got worse today causing her difficulty walking. Pt denies being out in the heat. Pt AOX4, NAD noted.  140/90 95 HR

## 2024-07-12 NOTE — Progress Notes (Signed)
 PHARMACIST - PHYSICIAN COMMUNICATION  CONCERNING:  Enoxaparin  (Lovenox ) for DVT Prophylaxis    RECOMMENDATION: Patient was prescribed enoxaprin 40mg  q24 hours for VTE prophylaxis.   Filed Weights   07/12/24 1035  Weight: 116.6 kg (257 lb)    Body mass index is 39.08 kg/m.  Estimated Creatinine Clearance: 74.8 mL/min (A) (by C-G formula based on SCr of 1.22 mg/dL (H)).   Based on [facility] policy patient is candidate for enoxaparin  0.5mg /kg TBW SQ every 24 hours based on BMI being >30.  DESCRIPTION: Pharmacy has adjusted enoxaparin  dose per [facility] policy.  Patient is now receiving enoxaparin  55 mg every 24 hours    Kayla JULIANNA Blew, PharmD Clinical Pharmacist  07/12/2024 2:03 PM

## 2024-07-13 DIAGNOSIS — R739 Hyperglycemia, unspecified: Secondary | ICD-10-CM | POA: Insufficient documentation

## 2024-07-13 DIAGNOSIS — E119 Type 2 diabetes mellitus without complications: Secondary | ICD-10-CM | POA: Diagnosis not present

## 2024-07-13 LAB — COMPREHENSIVE METABOLIC PANEL WITH GFR
ALT: 20 U/L (ref 0–44)
AST: 18 U/L (ref 15–41)
Albumin: 3.3 g/dL — ABNORMAL LOW (ref 3.5–5.0)
Alkaline Phosphatase: 94 U/L (ref 38–126)
Anion gap: 9 (ref 5–15)
BUN: 18 mg/dL (ref 6–20)
CO2: 25 mmol/L (ref 22–32)
Calcium: 9.2 mg/dL (ref 8.9–10.3)
Chloride: 105 mmol/L (ref 98–111)
Creatinine, Ser: 0.82 mg/dL (ref 0.44–1.00)
GFR, Estimated: >60 mL/min (ref >60)
Glucose, Bld: 82 mg/dL (ref 70–99)
Potassium: 4 mmol/L (ref 3.5–5.1)
Sodium: 139 mmol/L (ref 135–145)
Total Bilirubin: 0.7 mg/dL (ref 0.0–1.2)
Total Protein: 7.2 g/dL (ref 6.5–8.1)

## 2024-07-13 LAB — CBC
HCT: 36.3 % (ref 36.0–46.0)
Hemoglobin: 11.2 g/dL — ABNORMAL LOW (ref 12.0–15.0)
MCH: 25.4 pg — ABNORMAL LOW (ref 26.0–34.0)
MCHC: 30.9 g/dL (ref 30.0–36.0)
MCV: 82.3 fL (ref 80.0–100.0)
Platelets: 360 K/uL (ref 150–400)
RBC: 4.41 MIL/uL (ref 3.87–5.11)
RDW: 16.7 % — ABNORMAL HIGH (ref 11.5–15.5)
WBC: 12.8 K/uL — ABNORMAL HIGH (ref 4.0–10.5)
nRBC: 0 % (ref 0.0–0.2)

## 2024-07-13 LAB — GLUCOSE, CAPILLARY
Glucose-Capillary: 103 mg/dL — ABNORMAL HIGH (ref 70–99)
Glucose-Capillary: 150 mg/dL — ABNORMAL HIGH (ref 70–99)
Glucose-Capillary: 175 mg/dL — ABNORMAL HIGH (ref 70–99)
Glucose-Capillary: 72 mg/dL (ref 70–99)

## 2024-07-13 MED ORDER — LIVING WELL WITH DIABETES BOOK
Freq: Once | Status: DC
  Filled 2024-07-13: qty 1

## 2024-07-13 MED ORDER — METFORMIN HCL ER (MOD) 1000 MG PO TB24: 1000.0000 mg | ORAL_TABLET | Freq: Two times a day (BID) | ORAL | 1 refills | Status: AC

## 2024-07-13 MED ORDER — INSULIN ASPART 100 UNIT/ML IJ SOLN: 0.0000 [IU] | Freq: Three times a day (TID) | INTRAMUSCULAR | Status: DC

## 2024-07-13 MED ORDER — LAMIVUDINE 150 MG PO TABS
300.0000 mg | ORAL_TABLET | Freq: Every day | ORAL | Status: DC
  Filled 2024-07-13: qty 2

## 2024-07-13 MED ORDER — INSULIN ASPART 100 UNIT/ML IJ SOLN: 0.0000 [IU] | Freq: Every day | INTRAMUSCULAR | Status: DC

## 2024-07-13 MED ORDER — DOLUTEGRAVIR SODIUM 50 MG PO TABS
50.0000 mg | ORAL_TABLET | Freq: Every day | ORAL | Status: DC
  Filled 2024-07-13: qty 1

## 2024-07-13 NOTE — NC FL2 (Signed)
 [place]  MEDICAID FL2 LEVEL OF CARE FORM     IDENTIFICATION  Patient Name: Meghan Jarvis Birthdate: [birth year] Sex: female Admission Date (Current Location): 07/12/2024  [place] and [place] Number:  Chiropodist and Address:  [facility], [address], [place], [place] [address]      Provider Number: [id number]  Attending Physician Name and Address:  Kandis Devaughn Sayres, MD  Relative Name and Phone Number:  Bird,Latrell (Other)  [phone] (Mobile)    Current Level of Care: Hospital Recommended Level of Care: [facility] Prior Approval Number:    Date Approved/Denied:   PASRR Number:    Discharge Plan:      Current Diagnoses: Patient Active Problem List   Diagnosis Date Noted   Hyperglycemia 07/13/2024   Class 2 obesity 07/12/2024   Grade I diastolic dysfunction 07/12/2024   Weight gain 05/05/2024   Protein-calorie malnutrition, severe 03/23/2024   Encounter for assessment of decision-making capacity 03/22/2024   AIDS (acquired immune deficiency syndrome) (HCC) 02/21/2024   Right upper quadrant abdominal pain 12/14/2023   Cocaine abuse (HCC) 12/12/2023   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) 12/12/2023   Protein calorie malnutrition (HCC) 12/10/2023   Substance induced mood disorder (HCC) 11/30/2023   Crack cocaine use 11/30/2023   Homeless 11/28/2023   HIV (human immunodeficiency virus infection) (HCC)    Alcohol use disorder, severe, dependence (HCC) 05/18/2016   Tobacco abuse 08/10/2014   Bacterial vaginosis 04/15/2013   Chronic pelvic pain in female 04/04/2013   Cocaine abuse in remission (HCC) 10/19/2009   Migraine 12/30/1997   Asthma 12/30/1997    Orientation RESPIRATION BLADDER Height & Weight     Self, Time, Situation, Place  Normal   Weight: 257 lb (116.6 kg) Height:  5' 8 (172.7 cm)  BEHAVIORAL SYMPTOMS/MOOD NEUROLOGICAL BOWEL NUTRITION STATUS        Diet  (heart healthy/carb modified)  AMBULATORY STATUS COMMUNICATION OF NEEDS Skin   Independent Verbally Normal                       Personal Care Assistance Level of Assistance              Functional Limitations Info             SPECIAL CARE FACTORS FREQUENCY                       Contractures      Additional Factors Info  Allergies, Code Status Code Status Info: full Allergies Info: Fish Allergy, Shellfish Allergy, Buprenorphine Hcl, Morphine  And Codeine, Sulfa  Antibiotics            Medication List       STOP taking these medications     predniSONE  50 MG tablet Commonly known as: DELTASONE            TAKE these medications     acetaminophen  325 MG tablet Commonly known as: TYLENOL  Take 2 tablets (650 mg total) by mouth every 6 (six) hours as needed for mild pain (pain score 1-3), fever, headache or moderate pain (pain score 4-6).    ascorbic acid  500 MG tablet Commonly known as: VITAMIN C  Take 1 tablet (500 mg total) by mouth daily.    atovaquone  750 MG/5ML suspension Commonly known as: MEPRON  Take 10 mLs (1,500 mg total) by mouth daily with breakfast.    bisacodyl  5 MG EC tablet Generic drug: bisacodyl   Take 2 tablets (10 mg total) by mouth daily as needed for severe constipation.    cyanocobalamin  1000 MCG tablet Commonly known as: VITAMIN B12 Take 1 tablet (1,000 mcg total) by mouth daily.    diclofenac  Sodium 1 % Gel Commonly known as: VOLTAREN  Apply 2 g topically 3 (three) times daily as needed (pain in right hip/thigh and lower back).    Dovato  50-300 MG tablet Generic drug: dolutegravir -lamiVUDine  Take 1 tablet by mouth daily. Take at least 2 hours prior to iron  and magnesium  oxide.    DULoxetine  60 MG capsule Commonly known as: CYMBALTA  Take 1 capsule (60 mg total) by mouth daily.    Ferrex 150 150 MG capsule Generic drug: iron  polysaccharides Take 1 capsule (150 mg total) by mouth daily.    gabapentin  300 MG  capsule Commonly known as: NEURONTIN  Take 1 capsule (300 mg total) by mouth at bedtime.    hydrOXYzine  25 MG tablet Commonly known as: ATARAX  Take 1 tablet (25 mg total) by mouth 3 (three) times daily as needed for anxiety.    lidocaine  4 % SMARTSIG:Topical    metFORMIN  1000 MG (MOD) 24 hr tablet Commonly known as: GLUMETZA  Take 1 tablet (1,000 mg total) by mouth 2 (two) times daily with a meal.    midodrine  5 MG tablet Commonly known as: PROAMATINE  Take 2 tablets (10 mg total) by mouth 3 (three) times daily as needed (SBP <110).    mupirocin ointment 2 % Commonly known as: BACTROBAN SMARTSIG:1 Application Topical 2-3 Times Daily    naproxen  500 MG tablet Commonly known as: NAPROSYN  Take 500 mg by mouth 2 (two) times daily.    pantoprazole  40 MG tablet Commonly known as: PROTONIX  Take 1 tablet (40 mg total) by mouth daily.    polyethylene glycol powder 17 GM/SCOOP powder Commonly known as: GLYCOLAX /MIRALAX  Take 17 g by mouth daily as needed for moderate constipation.    potassium chloride  SA 20 MEQ tablet Commonly known as: KLOR-CON  M Take 2 tablets daily for 3 days THEN take 1 tablet (20 mEq total) by mouth every Monday, Wednesday, and Friday at 8 PM.    torsemide  20 MG tablet Commonly known as: DEMADEX  Take 2 tablets daily for 3 days THEN 1 tablet (20 mg total) by mouth every Monday, Wednesday, and Friday at 8 PM.    traMADol  50 MG tablet Commonly known as: ULTRAM  Take 50 mg by mouth 2 (two) times daily as needed.     Relevant Imaging Results:  Relevant Lab Results:   Additional Information SS#: [id number]  Xitlalic Maslin E Jeannifer Drakeford, LCSW

## 2024-07-13 NOTE — TOC Initial Note (Addendum)
 Transition of Care [facility]) - Initial/Assessment Note    Patient Details  Name: Meghan Jarvis MRN: [id number] Date of Birth: October 11, 1976  [facility] CM/SW Contact:    Beatrice Sehgal E Chavon Lucarelli, LCSW Phone Number: 07/13/2024, 9:02 AM  Clinical Narrative:                 CSW spoke with Rosana - he confirms patient is from [facility] and can return when medically ready.  10:05- Patient medically ready for DC. Rosana confirms patient can return today, and they can provide transport at 1PM. Just needs hard copy of FL2 sent with patient. Left VM for Sari with [facility] DSS to notify.   Expected Discharge Plan: Group Home Barriers to Discharge: Continued Medical Work up   Patient Goals and CMS Choice            Expected Discharge Plan and Services       Living arrangements for the past 2 months: Group Home                                      Prior Living Arrangements/Services Living arrangements for the past 2 months: Group Home                     Activities of Daily Living   ADL Screening (condition at time of admission) Independently performs ADLs?: Yes (appropriate for developmental age) Is the patient deaf or have difficulty hearing?: No Does the patient have difficulty seeing, even when wearing glasses/contacts?: No Does the patient have difficulty concentrating, remembering, or making decisions?: No  Permission Sought/Granted                  Emotional Assessment              Admission diagnosis:  Muscle spasm [M62.838] Newly diagnosed diabetes (HCC) [E11.9] Generalized weakness [R53.1] New onset type 2 diabetes mellitus (HCC) [E11.9] Patient Active Problem List   Diagnosis Date Noted   Newly diagnosed diabetes (HCC) 07/12/2024   Class 2 obesity 07/12/2024   Grade I diastolic dysfunction 07/12/2024   Weight gain 05/05/2024   Protein-calorie malnutrition, severe 03/23/2024   Encounter for assessment of  decision-making capacity 03/22/2024   AIDS (acquired immune deficiency syndrome) (HCC) 02/21/2024   Right upper quadrant abdominal pain 12/14/2023   Cocaine abuse (HCC) 12/12/2023   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 [place] [address] Phone: [phone] Fax: [phone]     Social Drivers of Health (SDOH) Social History: SDOH Screenings   Food Insecurity: No Food Insecurity (07/12/2024)  Housing: Unknown (07/12/2024)  Transportation Needs: No Transportation Needs (07/12/2024)  Utilities: Not At Risk (07/12/2024)  Social Connections: Unknown (07/12/2024)  Tobacco Use: High Risk (07/12/2024)   SDOH Interventions:  Readmission Risk Interventions     No data to display

## 2024-07-13 NOTE — Inpatient Diabetes Management (Signed)
 Inpatient Diabetes Program Recommendations  AACE/ADA: New Consensus Statement on Inpatient Glycemic Control (2015)  Target Ranges:  Prepandial:   less than 140 mg/dL      Peak postprandial:   less than 180 mg/dL (1-2 hours)      Critically ill patients:  140 - 180 mg/dL   Lab Results  Component Value Date   GLUCAP 150 (H) 07/13/2024   HGBA1C 6.0 (H) 12/09/2023    Review of Glycemic Control  Diabetes history: New-onset DM Outpatient Diabetes medications: None Current orders for Inpatient glycemic control: Novolog  0-15 TID with meals and 0-5 HS, metformin  500 mg with Breakfast  HgbA1C 6.0% on 12/09/23. Updated HgbA1C pending. Pt being discharged today back to group home. Will see PCP this week at her group home. Will be discharged home on metformin  1000 mg BID  Inpatient Diabetes Program Recommendations:    Call pt in room to discuss new diagnosis and importance of lifestyle modification with diet, weight loss and increase in physical activity. Pt agreed to reducing her portion sizes of food and will try to be more active. Pt understands she will be discharged on metformin  and will see PCP this week at group home. Encouraged to drink more water and less juice/soda/sweet tea. Pt states she had no further questions.  Thank you. Shona Brandy, RD, LDN, CDCES Inpatient Diabetes Coordinator [phone]

## 2024-07-13 NOTE — Discharge Summary (Signed)
 Meghan Jarvis FMW:[id number] DOB: 08-31-1976 DOA: 07/12/2024  PCP: [name]  Admit date: 07/12/2024 Discharge date: 07/13/2024  Time spent: 35 minutes  Recommendations for Outpatient Follow-up:  Pcp f/u 1 week (gets weekly visits from PCP at group home). Attention to blood sugar then ID f/u as scheduled     Discharge Diagnoses:  Active Problems:   Cocaine abuse (HCC)   HIV (human immunodeficiency virus infection) (HCC)   Homeless   Class 2 obesity   Asthma   Grade I diastolic dysfunction   Hyperglycemia   Discharge Condition: stable  Diet recommendation: carb modified  Filed Weights   07/12/24 1035  Weight: 116.6 kg    History of present illness:  From admission h and p Meghan Jarvis is a 48 y.o. female with medical history significant of class II obesity, asthma, depression, HIV infection, history of AIDS, history of alcohol abuse, history of cocaine use, tobacco abuse who presented to the emergency department with complaints of bilateral thigh cramping since last night.  These got worse this morning to the point that she has been unable to walk without difficulty today.  She has been having polyuria, polydipsia and blurred vision.  The patient stated that she eats a lot of sweets.  She ate a cookie after coming from the emergency department which increased her glucose to 552 mg/dL.  She still smokes, but her alcohol use is in remission for several years according to the patient.  She denied fever, chills, rhinorrhea, sore throat, wheezing or hemoptysis.  No chest pain, palpitations, diaphoresis, PND, orthopnea or pitting edema of the lower extremities.  She has occasional diarrhea, but no abdominal pain, nausea, emesis, constipation, melena or hematochezia.  No flank pain, dysuria, frequency or hematuria.   Hospital Course:   Patient presents with thigh cramping that has resolved. Also reports polyuria and polydipsia. Glucose found to be elevated here to 500s (though  that was just after eating a cookie). Recent random glucose have all been in the low 100s. A1c was 6 end of 2024. Received a 5 day course of oral prednisone  2 months ago but does not appear to have received steroids more recently. Significant weight gain since starting AIDS treatment likely contributory. Labs here did not show DKA. She was treated with sliding scale and glucose has normalized. A1c will not result for at least another 2 days (send out lab as our machine is broken). Patient feels well. Plan will be discharge to group home with new metformin . Spoke with group home director who says a PCP comes to the facility once a week, and he will ensure she is seen this week. Will need f/u of A1c and attention to glucose.  Procedures: none   Consultations: none  Discharge Exam: Vitals:   07/13/24 0309 07/13/24 0901  BP: (!) 130/91 (!) 120/104  Pulse: 77 91  Resp: 20 16  Temp: 98.4 F (36.9 C) 98.2 F (36.8 C)  SpO2: 100% 98%    General: NAD Cardiovascular: RRR Respiratory: CTAB  Discharge Instructions   Discharge Instructions     Diet Carb Modified   Complete by: As directed    Increase activity slowly   Complete by: As directed       Allergies as of 07/13/2024       Reactions   Fish Allergy Other (See Comments)   Crab legs result in itching Crab legs result in itching  Crab legs result in itching   Shellfish Allergy Other (See Comments)  Crab legs result in itching   Buprenorphine Hcl    Pt states she not allergic   Morphine  And Codeine Itching   hives   Sulfa  Antibiotics Rash        Medication List     STOP taking these medications    predniSONE  50 MG tablet Commonly known as: DELTASONE        TAKE these medications    acetaminophen  325 MG tablet Commonly known as: TYLENOL  Take 2 tablets (650 mg total) by mouth every 6 (six) hours as needed for mild pain (pain score 1-3), fever, headache or moderate pain (pain score 4-6).   ascorbic acid  500 MG  tablet Commonly known as: VITAMIN C  Take 1 tablet (500 mg total) by mouth daily.   atovaquone  750 MG/5ML suspension Commonly known as: MEPRON  Take 10 mLs (1,500 mg total) by mouth daily with breakfast.   bisacodyl  5 MG EC tablet Generic drug: bisacodyl  Take 2 tablets (10 mg total) by mouth daily as needed for severe constipation.   cyanocobalamin  1000 MCG tablet Commonly known as: VITAMIN B12 Take 1 tablet (1,000 mcg total) by mouth daily.   diclofenac  Sodium 1 % Gel Commonly known as: VOLTAREN  Apply 2 g topically 3 (three) times daily as needed (pain in right hip/thigh and lower back).   Dovato  50-300 MG tablet Generic drug: dolutegravir -lamiVUDine  Take 1 tablet by mouth daily. Take at least 2 hours prior to iron  and magnesium  oxide.   DULoxetine  60 MG capsule Commonly known as: CYMBALTA  Take 1 capsule (60 mg total) by mouth daily.   Ferrex 150 150 MG capsule Generic drug: iron  polysaccharides Take 1 capsule (150 mg total) by mouth daily.   gabapentin  300 MG capsule Commonly known as: NEURONTIN  Take 1 capsule (300 mg total) by mouth at bedtime.   hydrOXYzine  25 MG tablet Commonly known as: ATARAX  Take 1 tablet (25 mg total) by mouth 3 (three) times daily as needed for anxiety.   lidocaine  4 % SMARTSIG:Topical   metFORMIN  1000 MG (MOD) 24 hr tablet Commonly known as: GLUMETZA  Take 1 tablet (1,000 mg total) by mouth 2 (two) times daily with a meal.   midodrine  5 MG tablet Commonly known as: PROAMATINE  Take 2 tablets (10 mg total) by mouth 3 (three) times daily as needed (SBP <110).   mupirocin ointment 2 % Commonly known as: BACTROBAN SMARTSIG:1 Application Topical 2-3 Times Daily   naproxen  500 MG tablet Commonly known as: NAPROSYN  Take 500 mg by mouth 2 (two) times daily.   pantoprazole  40 MG tablet Commonly known as: PROTONIX  Take 1 tablet (40 mg total) by mouth daily.   polyethylene glycol powder 17 GM/SCOOP powder Commonly known as:  GLYCOLAX /MIRALAX  Take 17 g by mouth daily as needed for moderate constipation.   potassium chloride  SA 20 MEQ tablet Commonly known as: KLOR-CON  M Take 2 tablets daily for 3 days THEN take 1 tablet (20 mEq total) by mouth every Monday, Wednesday, and Friday at 8 PM.   torsemide  20 MG tablet Commonly known as: DEMADEX  Take 2 tablets daily for 3 days THEN 1 tablet (20 mg total) by mouth every Monday, Wednesday, and Friday at 8 PM.   traMADol  50 MG tablet Commonly known as: ULTRAM  Take 50 mg by mouth 2 (two) times daily as needed.       Allergies  Allergen Reactions   Fish Allergy Other (See Comments)    Crab legs result in itching  Crab legs result in itching  Crab legs result in itching  Shellfish Allergy Other (See Comments)    Crab legs result in itching   Buprenorphine Hcl     Pt states she not allergic   Morphine  And Codeine Itching    hives   Sulfa  Antibiotics Rash    Follow-up Information     Your PCP at the group home Follow up.                   The results of significant diagnostics from this hospitalization (including imaging, microbiology, ancillary and laboratory) are listed below for reference.    Significant Diagnostic Studies: DG Chest 2 View Result Date: 07/12/2024 EXAM: 2 VIEW(S) XRAY OF THE CHEST 07/12/2024 12:17:00 PM COMPARISON: None available. CLINICAL HISTORY: Pain. Table formatting from the original note was not included. Pt from [place] w/ c/o bilateral thigh cramping that started last night and got worse today causing her difficulty walking. Pt denies being out in the heat. FINDINGS: LUNGS AND PLEURA: No focal pulmonary opacity. No pulmonary edema. No pleural effusion. No pneumothorax. HEART AND MEDIASTINUM: No acute abnormality of the cardiac and mediastinal silhouettes. BONES AND SOFT TISSUES: No acute osseous abnormality. IMPRESSION: 1. No acute process. Electronically signed by: Dayne Hassell MD 07/12/2024 12:22 PM EDT RP  Workstation: [id number]   DG Knee Complete 4 Views Left Result Date: 07/12/2024 EXAM: 4 or more VIEW(S) XRAY OF THE LEFT KNEE 07/12/2024 12:17:00 PM COMPARISON: 05/22/2024 CLINICAL HISTORY: Pain swelling. Table formatting from the original note was not included. Pt from [place] w/ c/o bilateral thigh cramping that started last night and got worse today causing her difficulty walking. Pt denies being out in the heat. FINDINGS: BONES AND JOINTS: No acute fracture. No focal osseous lesion. No joint dislocation. No significant joint effusion. Progressive narrowing of the medial compartment articular cartilage, with marginal spurring from femoral condyles and tibial plateau. SOFT TISSUES: The soft tissues are unremarkable. IMPRESSION: 1. Progressive medial compartment degenerative joint disease. 2. No acute findings. Electronically signed by: Dayne Hassell MD 07/12/2024 12:21 PM EDT RP Workstation: [id number]    Microbiology: No results found for this or any previous visit (from the past 240 hours).   Labs: Basic Metabolic Panel: Recent Labs  Lab 07/12/24 1038 07/13/24 0513  NA 141 139  K 4.2 4.0  CL 105 105  CO2 23 25  GLUCOSE 304* 82  BUN 24* 18  CREATININE 1.22* 0.82  CALCIUM  9.3 9.2  MG 2.1  --   PHOS 3.4  --    Liver Function Tests: Recent Labs  Lab 07/12/24 1038 07/13/24 0513  AST 29 18  ALT 22 20  ALKPHOS 100 94  BILITOT 0.5 0.7  PROT 6.8 7.2  ALBUMIN 3.3* 3.3*   No results for input(s): LIPASE, AMYLASE in the last 168 hours. No results for input(s): AMMONIA in the last 168 hours. CBC: Recent Labs  Lab 07/12/24 1038 07/13/24 0513  WBC 9.7 12.8*  NEUTROABS 8.1*  --   HGB 10.9* 11.2*  HCT 35.6* 36.3  MCV 83.2 82.3  PLT 349 360   Cardiac Enzymes: Recent Labs  Lab 07/12/24 1038  CKTOTAL 169   BNP: BNP (last 3 results) Recent Labs    12/08/23 2213 04/08/24 1501  BNP 62.7 70.7    ProBNP (last 3 results) No results for input(s): PROBNP in  the last 8760 hours.  CBG: Recent Labs  Lab 07/12/24 1613 07/12/24 1935 07/13/24 0001 07/13/24 0306 07/13/24 0744  GLUCAP 552* 116* 175* 72 103*  Signed:  Devaughn KATHEE Ban MD.  [facility] 07/13/2024, 10:27 AM

## 2024-07-13 NOTE — Plan of Care (Signed)
  Problem: Education: Goal: Knowledge of General Education information will improve Description: Including pain rating scale, medication(s)/side effects and non-pharmacologic comfort measures Outcome: Adequate for Discharge   Problem: Health Behavior/Discharge Planning: Goal: Ability to manage health-related needs will improve Outcome: Adequate for Discharge   Problem: Clinical Measurements: Goal: Ability to maintain clinical measurements within normal limits will improve Outcome: Adequate for Discharge Goal: Will remain free from infection Outcome: Adequate for Discharge Goal: Diagnostic test results will improve Outcome: Adequate for Discharge Goal: Respiratory complications will improve Outcome: Adequate for Discharge Goal: Cardiovascular complication will be avoided Outcome: Adequate for Discharge   Problem: Activity: Goal: Risk for activity intolerance will decrease Outcome: Adequate for Discharge   Problem: Nutrition: Goal: Adequate nutrition will be maintained Outcome: Adequate for Discharge   Problem: Coping: Goal: Level of anxiety will decrease Outcome: Adequate for Discharge   Problem: Elimination: Goal: Will not experience complications related to bowel motility Outcome: Adequate for Discharge Goal: Will not experience complications related to urinary retention Outcome: Adequate for Discharge   Problem: Pain Managment: Goal: General experience of comfort will improve and/or be controlled Outcome: Adequate for Discharge   Problem: Safety: Goal: Ability to remain free from injury will improve Outcome: Adequate for Discharge   Problem: Skin Integrity: Goal: Risk for impaired skin integrity will decrease Outcome: Adequate for Discharge   Problem: Education: Goal: Ability to describe self-care measures that may prevent or decrease complications (Diabetes Survival Skills Education) will improve Outcome: Adequate for Discharge Goal: Individualized Educational  Video(s) Outcome: Adequate for Discharge   Problem: Coping: Goal: Ability to adjust to condition or change in health will improve Outcome: Adequate for Discharge   Problem: Fluid Volume: Goal: Ability to maintain a balanced intake and output will improve Outcome: Adequate for Discharge   Problem: Health Behavior/Discharge Planning: Goal: Ability to identify and utilize available resources and services will improve Outcome: Adequate for Discharge Goal: Ability to manage health-related needs will improve Outcome: Adequate for Discharge   Problem: Metabolic: Goal: Ability to maintain appropriate glucose levels will improve Outcome: Adequate for Discharge   Problem: Nutritional: Goal: Maintenance of adequate nutrition will improve Outcome: Adequate for Discharge Goal: Progress toward achieving an optimal weight will improve Outcome: Adequate for Discharge   Problem: Skin Integrity: Goal: Risk for impaired skin integrity will decrease Outcome: Adequate for Discharge   Problem: Tissue Perfusion: Goal: Adequacy of tissue perfusion will improve Outcome: Adequate for Discharge    Pt D/C to [facility] . A&OX4 BP (!) 120/104 (BP Location: Left Arm)   Pulse 91   Temp 98.2 F (36.8 C)   Resp 16   Ht 5' 8 (1.727 m)   Wt 116.6 kg   SpO2 98%   BMI 39.08 kg/m  AVS reviewed, follow up questions answered. No further needs voiced. IV removed/tele removed and belongings returned. Meghan Jarvis. 07/13/24 1:15 PM

## 2024-07-14 LAB — HEMOGLOBIN A1C
Hgb A1c MFr Bld: 7.6 % — ABNORMAL HIGH (ref 4.8–5.6)
Mean Plasma Glucose: 171 mg/dL

## 2024-07-15 ENCOUNTER — Other Ambulatory Visit
Admission: RE | Admit: 2024-07-15 | Discharge: 2024-07-15 | Disposition: A | Discharge status: Home / Self Care | Payer: MEDICAID | Source: Ambulatory Visit | Attending: Infectious Diseases | Admitting: Infectious Diseases

## 2024-07-15 ENCOUNTER — Ambulatory Visit: Payer: MEDICAID | Attending: Infectious Diseases | Admitting: Infectious Diseases

## 2024-07-15 ENCOUNTER — Encounter: Payer: Self-pay | Admitting: Infectious Diseases

## 2024-07-15 VITALS — BP 131/89 | HR 88 | Temp 97.3°F | Wt 252.0 lb

## 2024-07-15 DIAGNOSIS — B20 Human immunodeficiency virus [HIV] disease: Secondary | ICD-10-CM | POA: Diagnosis present

## 2024-07-15 DIAGNOSIS — Z7984 Long term (current) use of oral hypoglycemic drugs: Secondary | ICD-10-CM | POA: Insufficient documentation

## 2024-07-15 DIAGNOSIS — Z6838 Body mass index (BMI) 38.0-38.9, adult: Secondary | ICD-10-CM | POA: Diagnosis not present

## 2024-07-15 DIAGNOSIS — Z79899 Other long term (current) drug therapy: Secondary | ICD-10-CM | POA: Diagnosis not present

## 2024-07-15 DIAGNOSIS — E1169 Type 2 diabetes mellitus with other specified complication: Secondary | ICD-10-CM | POA: Insufficient documentation

## 2024-07-15 DIAGNOSIS — F1491 Cocaine use, unspecified, in remission: Secondary | ICD-10-CM | POA: Diagnosis not present

## 2024-07-15 DIAGNOSIS — E119 Type 2 diabetes mellitus without complications: Secondary | ICD-10-CM | POA: Diagnosis not present

## 2024-07-15 DIAGNOSIS — R635 Abnormal weight gain: Secondary | ICD-10-CM | POA: Insufficient documentation

## 2024-07-15 LAB — CBC WITH DIFFERENTIAL/PLATELET
Abs Immature Granulocytes: 0.04 K/uL (ref 0.00–0.07)
Basophils Absolute: 0 K/uL (ref 0.0–0.1)
Basophils Relative: 0 %
Eosinophils Absolute: 0 K/uL (ref 0.0–0.5)
Eosinophils Relative: 0 %
HCT: 35.7 % — ABNORMAL LOW (ref 36.0–46.0)
Hemoglobin: 11.4 g/dL — ABNORMAL LOW (ref 12.0–15.0)
Immature Granulocytes: 1 %
Lymphocytes Relative: 15 %
Lymphs Abs: 1 K/uL (ref 0.7–4.0)
MCH: 25.7 pg — ABNORMAL LOW (ref 26.0–34.0)
MCHC: 31.9 g/dL (ref 30.0–36.0)
MCV: 80.4 fL (ref 80.0–100.0)
Monocytes Absolute: 0.1 K/uL (ref 0.1–1.0)
Monocytes Relative: 2 %
Neutro Abs: 5.8 K/uL (ref 1.7–7.7)
Neutrophils Relative %: 82 %
Platelets: 328 K/uL (ref 150–400)
RBC: 4.44 MIL/uL (ref 3.87–5.11)
RDW: 16.8 % — ABNORMAL HIGH (ref 11.5–15.5)
WBC: 7 K/uL (ref 4.0–10.5)
nRBC: 0 % (ref 0.0–0.2)

## 2024-07-15 LAB — COMPREHENSIVE METABOLIC PANEL WITH GFR
ALT: 23 U/L (ref 0–44)
AST: 24 U/L (ref 15–41)
Albumin: 3.4 g/dL — ABNORMAL LOW (ref 3.5–5.0)
Alkaline Phosphatase: 93 U/L (ref 38–126)
Anion gap: 12 (ref 5–15)
BUN: 17 mg/dL (ref 6–20)
CO2: 23 mmol/L (ref 22–32)
Calcium: 9.3 mg/dL (ref 8.9–10.3)
Chloride: 102 mmol/L (ref 98–111)
Creatinine, Ser: 0.99 mg/dL (ref 0.44–1.00)
GFR, Estimated: >60 mL/min (ref >60)
Glucose, Bld: 157 mg/dL — ABNORMAL HIGH (ref 70–99)
Potassium: 4.5 mmol/L (ref 3.5–5.1)
Sodium: 137 mmol/L (ref 135–145)
Total Bilirubin: 0.8 mg/dL (ref 0.0–1.2)
Total Protein: 7.3 g/dL (ref 6.5–8.1)

## 2024-07-15 NOTE — Patient Instructions (Signed)
 You are here to follow up on HIV- you are on Dovato  and atovaquone - today will do labs- you have gained 30 pounds since you got discharged and you have been diagnosed with diabetes. Please establish care with an endocrine specialist Need to reduce sugar intake and cult down on processed foods- to eat whole food with plenty of vegetables. Follow up 2 months

## 2024-07-15 NOTE — Progress Notes (Addendum)
 NAME: Meghan Jarvis  DOB: 03-Dec-1976  MRN: [id number]  Date/Time: 07/15/2024 10:43 AM   Subjective:  Pt here to engage in HIV care- here with group home attendant and DSS legal guardian ?[employer] Pt here with [place] DSS guardian, Mel  Candis Hocking PCP- [phone]  Meghan Jarvis is a 48 y.o. with a history of AIDS, newly diagnosed DM, H/o cocaine use, schizophrenia HIV diagnosed in 2021 Nadir Cd4 -12 on 12/09/23 VL  20 million 12/09/23 OI  HAARt history Biktarvy  Dovato   Proloned hospitalization at [facility] 12/08/23  -05/07/24 For acute resp failure, cocaine abuse, intubated , Brain MRI cerebral atrophy, after extubation on 12/31 started on Biktarvy  which was changed to Dovato  on 5/27 because of 100 pound weight gain. She wa sin the hospital longer because of disposition issue, incapacity to make any decisions, DSS involved and she was sent to group home While in the hospital she was extensively investigated for Ois Toxo IgG > 400 Toxo PCR neg Toxo igM neg RPR NR CMV DNA neg Crypto neg HIV RNA 2 million>> 34, 000 Cd4 is 14 ( 2.4%) repeat on 1/29 is 416 ( 23%) Beta D glucan > 500 (  repeated) negative Histoplasma neg Fungal antibodies negative Genosure prime- no resistant mutations QuantiFERON gold indeterminate.  No treatment given .  Can repeat it later.  She also had rt hip pain and low back pain and had MRI of the lumbar spine and rt hip did not show any significant findings other that intramuscular edema and enhancement of rt Gluteus  Ptwas recently in hospital 8/2-8/3 with muscle cramps and diagnosed ot have BS of 500, DC on metformin  She has been eating lot of sugary products- She iuse dot be a cocaine user and now has replaced with sugar- cookies , cakes, MM etx Has gain nearly 25 pounds since discharge ? Past Medical History:  Diagnosis Date   Asthma    Depression    HIV (human immunodeficiency virus infection) (HCC)    Cocaine use Failue to  thrive  Past Surgical History:  Procedure Laterality Date   ABDOMINAL HYSTERECTOMY     APPENDECTOMY      Social History   Socioeconomic History   Marital status: Divorced    Spouse name: Not on file   Number of children: Not on file   Years of education: Not on file   Highest education level: Not on file  Occupational History   Not on file  Tobacco Use   Smoking status: Every Day    Current packs/day: 1.00    Types: Cigarettes   Smokeless tobacco: Never  Vaping Use   Vaping status: Former  Substance and Sexual Activity   Alcohol use: Not Currently   Drug use: Not Currently    Types: Cocaine   Sexual activity: Yes    Birth control/protection: Surgical  Other Topics Concern   Not on file  Social History Narrative   Not on file   Social Drivers of Health   Financial Resource Strain: Not on file  Food Insecurity: No Food Insecurity (07/12/2024)   Hunger Vital Sign    Worried About Running Out of Food in the Last Year: Never true    Ran Out of Food in the Last Year: Never true  Transportation Needs: No Transportation Needs (07/12/2024)   PRAPARE - Administrator, Civil Service (Medical): No    Lack of Transportation (Non-Medical): No  Physical Activity: Not on file  Stress: Not on file  Social Connections: Unknown (07/12/2024)   Social Connection and Isolation Panel    Frequency of Communication with Friends and Family: Patient declined    Frequency of Social Gatherings with Friends and Family: Patient declined    Attends Religious Services: Not on Insurance claims handler of Clubs or Organizations: Patient declined    Attends Banker Meetings: Not on file    Marital Status: Patient declined  Intimate Partner Violence: Not At Risk (07/12/2024)   Humiliation, Afraid, Rape, and Kick questionnaire    Fear of Current or Ex-Partner: No    Emotionally Abused: No    Physically Abused: No    Sexually Abused: No    Family History  Family history unknown:  Yes   Allergies  Allergen Reactions   Fish Allergy Other (See Comments)    Crab legs result in itching  Crab legs result in itching  Crab legs result in itching   Shellfish Allergy Other (See Comments)    Crab legs result in itching   Buprenorphine Hcl     Pt states she not allergic   Morphine  And Codeine Itching    hives   Sulfa  Antibiotics Rash   ? Current Outpatient Medications  Medication Sig Dispense Refill   atovaquone  (MEPRON ) 750 MG/5ML suspension Take 10 mLs (1,500 mg total) by mouth daily with breakfast. 300 mL 11   bisacodyl  (DULCOLAX) 5 MG EC tablet Take 2 tablets (10 mg total) by mouth daily as needed for severe constipation. 30 tablet 0   cyanocobalamin  1000 MCG tablet Take 1 tablet (1,000 mcg total) by mouth daily. 30 tablet 2   diclofenac  Sodium (VOLTAREN ) 1 % GEL Apply 2 g topically 3 (three) times daily as needed (pain in right hip/thigh and lower back). 100 g 0   dolutegravir -lamiVUDine  (DOVATO ) 50-300 MG tablet Take 1 tablet by mouth daily. Take at least 2 hours prior to iron  and magnesium  oxide. 30 tablet 11   DULoxetine  (CYMBALTA ) 60 MG capsule Take 1 capsule (60 mg total) by mouth daily. 30 capsule 0   gabapentin  (NEURONTIN ) 300 MG capsule Take 1 capsule (300 mg total) by mouth at bedtime. 30 capsule 0   hydrOXYzine  (ATARAX ) 25 MG tablet Take 1 tablet (25 mg total) by mouth 3 (three) times daily as needed for anxiety. 30 tablet 0   iron  polysaccharides (NIFEREX) 150 MG capsule Take 1 capsule (150 mg total) by mouth daily. 30 capsule 2   lidocaine  4 % SMARTSIG:Topical     metFORMIN  (GLUMETZA ) 1000 MG (MOD) 24 hr tablet Take 1 tablet (1,000 mg total) by mouth 2 (two) times daily with a meal. 180 tablet 1   mupirocin ointment (BACTROBAN) 2 % SMARTSIG:1 Application Topical 2-3 Times Daily     naproxen  (NAPROSYN ) 500 MG tablet Take 500 mg by mouth 2 (two) times daily.     pantoprazole  (PROTONIX ) 40 MG tablet Take 1 tablet (40 mg total) by mouth daily. 30 tablet 2    polyethylene glycol powder (GLYCOLAX /MIRALAX ) 17 GM/SCOOP powder Take 17 g by mouth daily as needed for moderate constipation. 238 g 0   potassium chloride  SA (KLOR-CON  M) 20 MEQ tablet Take 2 tablets daily for 3 days THEN take 1 tablet (20 mEq total) by mouth every Monday, Wednesday, and Friday at 8 PM. 30 tablet 0   torsemide  (DEMADEX ) 20 MG tablet Take 2 tablets daily for 3 days THEN 1 tablet (20 mg total) by mouth every Monday, Wednesday, and Friday at 8 PM. 30 tablet  0   traMADol  (ULTRAM ) 50 MG tablet Take 50 mg by mouth 2 (two) times daily as needed.     No current facility-administered medications for this visit.    REVIEW OF SYSTEMS:  Const: negative fever, negative chills, weight gain of 10 pounds in 6 months Eyes: negative diplopia or visual changes, negative eye pain ENT: negative coryza, negative sore throat Resp: negative cough, hemoptysis, dyspnea Cards: negative for chest pain, palpitations, lower extremity edema GU: negative for frequency, dysuria and hematuria Skin: negative for rash and pruritus Heme: negative for easy bruising and gum/nose bleeding MS: low back pain Neurolo:negative for headaches, dizziness, vertigo, memory problems  Psych: schizophrenia  Pertinent Positives include :sulfa  caused rash when she wa sin the hospital Objective:  VITALS:  BP 131/89   Pulse 88   Temp (!) 97.3 F (36.3 C) (Temporal)   Wt 252 lb (114.3 kg)   SpO2 99%   BMI 38.32 kg/m  PHYSICAL EXAM:  General: Alert, cooperative, no distress,obese- emotional instability  Head: Normocephalic, without obvious abnormality, atraumatic. Eyes: Conjunctivae clear, anicteric sclerae. Pupils are equal Nose: Nares normal. No drainage or sinus tenderness. Throat: Lips, mucosa, and tongue normal. No Thrush Neck: Supple, symmetrical, no adenopathy, thyroid: non tender no carotid bruit and no JVD. Back: No CVA tenderness. Lungs: Clear to auscultation bilaterally. No Wheezing or Rhonchi. No  rales. Heart: Regular rate and rhythm, no murmur, rub or gallop. Abdomen: Soft, non-tender,not distended. Bowel sounds normal. No masses Extremities: Extremities normal, atraumatic, no cyanosis. No edema. No clubbing Skin: No rashes or lesions. Not Jaundiced Lymph: Cervical, supraclavicular normal. Neurologic: Grossly non-focal Pertinent Labs { IMAGING RESULTS: Health maintenance Vaccination  Vaccine Date last given comment  Influenza    Hepatitis B    Hepatitis A    Prevnar-PCV-13    Pneumovac-PPSV-23    TdaP    HPV    Shingrix ( zoster vaccine)     ______________________  Labs Lab Result  Date comment  HIV VL     CD4     Genotype     [id number]     HIV antibody     RPR     Quantiferon Gold     Hep C ab     Hepatitis B-ab,ag,c     Hepatitis A-IgM, IgG /T     Lipid     GC/CHL     PAP     HB,PLT,Cr, LFT       Preventive  Procedure Result  Date comment  colonoscopy     Mammogram     Dental exam     Opthal       Impression/Recommendation ? AIDS- saw her when she was in the hospital- Here to engage in care- on Dovato  Last Vl was 40 and cd4 was 416 in the hospital 2 months ago'will do labs today  Newly diagnosed DM- weight gain of 130 pounds is contributing to it Currently on metofrmin Need to see endocrine specialist Or management by PCP.  Excessive weight gain of 130 pounds- combination of treatment for HIV, not using cocaine anymore, not homeless and substituting cocaine with sugary food addiction ? Glp1 agonist Discussed wieight reuction, avoiding refined carbs, having whole foods, vegetables Behavioral disorder  H/o cocaine use  Will get her vaccination records  Health maintenance to be updated Discussed with patient in detail, Discussed her legal guardian and group home attendant   Follow up 2 months  ? ? ___________________________________________________

## 2024-07-16 LAB — HIV-1 RNA QUANT-NO REFLEX-BLD
HIV 1 RNA Quant: <20 {copies}/mL
LOG10 HIV-1 RNA: UNDETERMINED {Log_copies}/mL

## 2024-07-16 LAB — RPR: RPR Ser Ql: NONREACTIVE

## 2024-07-16 LAB — TOXOPLASMA ANTIBODIES- IGG AND  IGM
Toxoplasma Antibody- IgM: <3 [AU]/ml (ref 0.0–7.9)
Toxoplasma IgG Ratio: 138 [IU]/mL — ABNORMAL HIGH (ref 0.0–7.1)

## 2024-07-17 LAB — T-HELPER CELLS CD4/CD8 %
% CD 4 Pos. Lymph.: 5.2 % — ABNORMAL LOW (ref 30.8–58.5)
Absolute CD 4 Helper: 57 /uL — ABNORMAL LOW (ref 359–1519)
Basophils Absolute: 0 x10E3/uL (ref 0.0–0.2)
Basos: 0 %
CD3+CD4+ Cells/CD3+CD8+ Cells Bld: 0.18 — ABNORMAL LOW (ref 0.92–3.72)
CD3+CD8+ Cells # Bld: 322 /uL (ref 109–897)
CD3+CD8+ Cells NFr Bld: 29.3 % (ref 12.0–35.5)
EOS (ABSOLUTE): 0 x10E3/uL (ref 0.0–0.4)
Eos: 0 %
Hematocrit: 36.9 % (ref 34.0–46.6)
Hemoglobin: 11.2 g/dL (ref 11.1–15.9)
Immature Grans (Abs): 0 x10E3/uL (ref 0.0–0.1)
Immature Granulocytes: 0 %
Lymphocytes Absolute: 1.1 x10E3/uL (ref 0.7–3.1)
Lymphs: 15 %
MCH: 25.1 pg — ABNORMAL LOW (ref 26.6–33.0)
MCHC: 30.4 g/dL — ABNORMAL LOW (ref 31.5–35.7)
MCV: 83 fL (ref 79–97)
Monocytes Absolute: 0.2 x10E3/uL (ref 0.1–0.9)
Monocytes: 3 %
Neutrophils Absolute: 5.7 x10E3/uL (ref 1.4–7.0)
Neutrophils: 82 %
Platelets: 344 x10E3/uL (ref 150–450)
RBC: 4.46 x10E6/uL (ref 3.77–5.28)
RDW: 15.8 % — ABNORMAL HIGH (ref 11.7–15.4)
WBC: 7 x10E3/uL (ref 3.4–10.8)

## 2024-07-22 ENCOUNTER — Other Ambulatory Visit: Payer: Self-pay

## 2024-07-22 ENCOUNTER — Emergency Department: Payer: MEDICAID

## 2024-07-22 ENCOUNTER — Emergency Department
Admission: EM | Admit: 2024-07-22 | Discharge: 2024-07-22 | Discharge status: Against Medical Advice | Payer: MEDICAID | Attending: Emergency Medicine | Admitting: Emergency Medicine

## 2024-07-22 ENCOUNTER — Encounter: Payer: Self-pay | Admitting: Emergency Medicine

## 2024-07-22 DIAGNOSIS — Z5321 Procedure and treatment not carried out due to patient leaving prior to being seen by health care provider: Secondary | ICD-10-CM | POA: Diagnosis not present

## 2024-07-22 DIAGNOSIS — H538 Other visual disturbances: Secondary | ICD-10-CM | POA: Diagnosis present

## 2024-07-22 DIAGNOSIS — R300 Dysuria: Secondary | ICD-10-CM | POA: Diagnosis not present

## 2024-07-22 DIAGNOSIS — R519 Headache, unspecified: Secondary | ICD-10-CM | POA: Insufficient documentation

## 2024-07-22 NOTE — ED Provider Triage Note (Signed)
 Emergency Medicine Provider Triage Evaluation Note  Meghan Jarvis , a 48 y.o. female  was evaluated in triage.  Pt complains of blurry vision, headache.  Patient states having blurry vision in the last 3 weeks.  Patient complains of dysuria  Review of Systems  Positive:  Negative:   Physical Exam  BP 98/80   Pulse (!) 107   Temp 98.2 F (36.8 C) (Oral)   Resp 18   Ht 5' 8 (1.727 m)   Wt 112 kg   SpO2 97%   BMI 37.56 kg/m during triage patient was tachycardic Gen:   Awake, no distress   Resp:  Normal effort  MSK:   Moves extremities without difficulty  Other:    Medical Decision Making  Medically screening exam initiated at 5:22 PM.  Appropriate orders placed.  Meghan Jarvis was informed that the remainder of the evaluation will be completed by another provider, this initial triage assessment does not replace that evaluation, and the importance of remaining in the ED until their evaluation is complete.  Presents with headache, blurry vision and dysuria.  Ordered UA   Janit Kast, PA-C 07/22/24 1723

## 2024-07-22 NOTE — ED Triage Notes (Signed)
 Pt reports blurred vision and headache x 3 weeks. Pt reports she is supposed to have cataract surgery and believes her headaches are coming from her blurred vision.

## 2024-07-22 NOTE — ED Notes (Signed)
 Mrs. Meghan Jarvis @ [facility] facility contacted by Clinical research associate who reports pt left without being seen and back at the facility.

## 2024-07-22 NOTE — ED Notes (Signed)
 First nurse note-pt brought in via ems from [facility]   pt has a headache for 2 days   no n/v  pt has painful eyes with itching.  Pt alert in wheelchair in lobby.  Fsbs 172 per ems, bp 110/71, t-98.4, oxygen sats 98% room air. P-108

## 2024-07-29 ENCOUNTER — Other Ambulatory Visit: Payer: Self-pay

## 2024-07-29 ENCOUNTER — Ambulatory Visit: Payer: Self-pay

## 2024-07-29 DIAGNOSIS — B20 Human immunodeficiency virus [HIV] disease: Secondary | ICD-10-CM

## 2024-07-31 ENCOUNTER — Emergency Department
Admission: EM | Admit: 2024-07-31 | Discharge: 2024-07-31 | Disposition: A | Discharge status: Home / Self Care | Payer: MEDICAID | Attending: Emergency Medicine | Admitting: Emergency Medicine

## 2024-07-31 ENCOUNTER — Emergency Department: Payer: MEDICAID

## 2024-07-31 ENCOUNTER — Other Ambulatory Visit: Payer: Self-pay

## 2024-07-31 DIAGNOSIS — M5441 Lumbago with sciatica, right side: Secondary | ICD-10-CM | POA: Diagnosis not present

## 2024-07-31 DIAGNOSIS — J45909 Unspecified asthma, uncomplicated: Secondary | ICD-10-CM | POA: Diagnosis not present

## 2024-07-31 DIAGNOSIS — Z21 Asymptomatic human immunodeficiency virus [HIV] infection status: Secondary | ICD-10-CM | POA: Diagnosis not present

## 2024-07-31 DIAGNOSIS — M51379 Other intervertebral disc degeneration, lumbosacral region without mention of lumbar back pain or lower extremity pain: Secondary | ICD-10-CM | POA: Insufficient documentation

## 2024-07-31 DIAGNOSIS — M545 Low back pain, unspecified: Secondary | ICD-10-CM | POA: Diagnosis present

## 2024-07-31 DIAGNOSIS — M5431 Sciatica, right side: Secondary | ICD-10-CM

## 2024-07-31 MED ORDER — METHOCARBAMOL 500 MG PO TABS
500.0000 mg | ORAL_TABLET | Freq: Three times a day (TID) | ORAL | 0 refills | Status: AC | PRN
Start: 2024-07-31 — End: ?

## 2024-07-31 MED ORDER — PREDNISONE 20 MG PO TABS
60.0000 mg | ORAL_TABLET | Freq: Once | ORAL | Status: AC
  Administered 2024-07-31: 60 mg via ORAL
  Filled 2024-07-31: qty 3

## 2024-07-31 MED ORDER — PREDNISONE 10 MG (21) PO TBPK: ORAL_TABLET | ORAL | 0 refills | Status: DC

## 2024-07-31 MED ORDER — METHOCARBAMOL 500 MG PO TABS
500.0000 mg | ORAL_TABLET | Freq: Once | ORAL | Status: AC
  Administered 2024-07-31: 500 mg via ORAL
  Filled 2024-07-31: qty 1

## 2024-07-31 NOTE — ED Provider Notes (Signed)
 [facility] Provider Note    Event Date/Time   First MD Initiated Contact with Patient 07/31/24 2012     (approximate)   History   Back Pain   HPI  Meghan Jarvis is a 48 y.o. female with history of alcohol use disorder, HIV, cocaine abuse, asthma and as listed in EMR presents to the emergency department for treatment and evaluation of pain in her lower back with radiation down the right leg.  Symptoms have been ongoing for over 2 months.  She has been evaluated for the same and was prescribed tramadol  which was not helped.  Today, she has taken tramadol  and Tylenol  without relief.  No change in symptoms over the past 24 hours.     Physical Exam    Vitals:   07/31/24 1807  BP: 110/81  Pulse: 93  Resp: 18  Temp: 98.1 F (36.7 C)  SpO2: 98%    General: Awake, no distress.  CV:  Good peripheral perfusion.  Resp:  Normal effort.  Abd:  No distention.  Other:  Negative straight leg raise bilaterally.  Nonfocal tenderness of the lumbar spine or sacral area.  Pain travels down the right buttock to the lateral aspect of the knee.  No weakness in the foot.   ED Results / Procedures / Treatments   Labs (all labs ordered are listed, but only abnormal results are displayed)  Labs Reviewed - No data to display   EKG  Not indicated   RADIOLOGY  Image and radiology report reviewed and interpreted by me. Radiology report consistent with the same.  X-ray negative for acute concerns.  PROCEDURES:  Critical Care performed: No  Procedures   MEDICATIONS ORDERED IN ED:  Medications  methocarbamol  (ROBAXIN ) tablet 500 mg (500 mg Oral Given 07/31/24 2113)  predniSONE  (DELTASONE ) tablet 60 mg (60 mg Oral Given 07/31/24 2113)     IMPRESSION / MDM / ASSESSMENT AND PLAN / ED COURSE   I have reviewed the triage note and vital signs. Vital signs are stable   Differential diagnosis includes, but is not limited to, lumbosacral pain, sciatica,  degenerative disc disease, lumbar vertebral abnormality.   Patient's presentation is most consistent with acute illness / injury with system symptoms.  48 year old female presenting to the emergency department for nontraumatic lower back pain that radiates into the right lower extremity.  See HPI for further details.  X-rays negative for acute concerns.  I did consider CT/MRI based on patient's history of HIV however there are no other red flags of back pain and this pain has been ongoing for the past 2 months without any acute changes.  She has no focal neurological deficits.  Vital signs are stable.  She is not febrile or tachycardic.  She is ambulatory without assistance.  Plan will be to treat her with prednisone  and methocarbamol .  She will be encouraged to follow-up with orthopedics on outpatient basis.  ER return precautions discussed.      FINAL CLINICAL IMPRESSION(S) / ED DIAGNOSES   Final diagnoses:  Sciatica of right side  Degeneration of intervertebral disc at L5-S1 level     Rx / DC Orders   ED Discharge Orders          Ordered    predniSONE  (STERAPRED UNI-PAK 21 TAB) 10 MG (21) TBPK tablet        07/31/24 2111    methocarbamol  (ROBAXIN ) 500 MG tablet  Every 8 hours PRN  07/31/24 2111             Note:  This document was prepared using Dragon voice recognition software and may include unintentional dictation errors.   Herlinda Kirk NOVAK, FNP 07/31/24 2126    Bradler, Evan K, MD 07/31/24 [phone]

## 2024-07-31 NOTE — ED Notes (Signed)
 First Nurse Note: Pt to ED via [facility] from [facility] c/o Lower back pain. Pt has chronic back pain. Pt has tried her medications that she has and it has not helped. Pts vital signs stable with EMS.

## 2024-07-31 NOTE — Discharge Instructions (Signed)
 Call and schedule follow-up appointment with orthopedics if medications prescribed today are not helping.

## 2024-07-31 NOTE — ED Notes (Signed)
 This RN spoke with Ronnald, Care coordinator at [facility], and DSS After hours [place].

## 2024-07-31 NOTE — ED Triage Notes (Signed)
 Pt to ED via [facility] from home care. Pt reports right lower back w/ radiation down her right leg. Pt has been told she may have something wrong with her dis but unsure. Pt states pain 10/10. No loss of bowel or bladder.

## 2024-07-31 NOTE — ED Notes (Signed)
 Patient given discharge instructions including prescriptions x2 and importance of follow up appt as needed with stated understanding. Patient wheeled out to [facility] from her group home to be taken back. Patient refused repeat VS stating she was ready to go now.

## 2024-08-06 ENCOUNTER — Other Ambulatory Visit
Admission: RE | Admit: 2024-08-06 | Discharge: 2024-08-06 | Disposition: A | Discharge status: Home / Self Care | Payer: MEDICAID | Source: Ambulatory Visit | Attending: Infectious Diseases | Admitting: Infectious Diseases

## 2024-08-06 DIAGNOSIS — B20 Human immunodeficiency virus [HIV] disease: Secondary | ICD-10-CM | POA: Insufficient documentation

## 2024-08-06 NOTE — Progress Notes (Signed)
 This note has been created using automated tools and reviewed for accuracy by [facility].  No chief complaint on file.   Subjective  Meghan Jarvis is a 48 y.o. female who presents for No chief complaint on file. HPI History of Present Illness Meghan Jarvis Reasons is a 48 year old female who presents with back and hip pain.  She experiences pain across the low back and hip area, which worsens throughout the day, particularly with ambulation. The pain is described as being located 'right across the low back' and in the hip area. She has a history of knee issues, which have been previously evaluated.  X-rays were taken today of the lumbar spine and hip.  She is currently taking tramadol  for pain management, along with gabapentin  and Mobic .  During the review of symptoms, she reports pain in the low back and hip area.  She has a preference for sweets, which may be impacting her blood sugar levels.  Review of Systems  Patient Active Problem List  Diagnosis  . Alcohol abuse  . Asthma (HHS-HCC)  . Atrial fibrillation, new onset (CMS/HHS-HCC)  . 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 acetaminophen  (TYLENOL ) 325 MG tablet Take 650 mg by mouth every 6 (six) hours as needed for Pain    . ascorbic acid , vitamin C , (VITAMIN C ) 500 MG tablet Take 500 mg by mouth once daily    . atazanavir -cobicistat  (EVOTAZ ) 300-150 mg tablet Take by mouth.    . atovaquone  (MEPRON ) 750 mg/5 mL suspension Take 1,500 mg by mouth daily with breakfast    . calcium  polycarbophil (FIBERCON) 625 mg tablet Take by mouth.    . clobetasol  (TEMOVATE ) 0.05 % ointment APPLY TOPICALLY TWO (2) TIMES A DAY.    . diclofenac  (VOLTAREN ) 1 % topical gel Apply 2 g topically 3 (three) times daily    . DULoxetine  (CYMBALTA ) 60 MG DR capsule Take 60 mg by mouth once daily.    . emtricitabine -tenofovir   alafenamide (DESCOVY ) 200-25 mg tablet Take by mouth.    . folic acid  (FOLVITE ) 1 MG tablet Take 1 mg by mouth once daily    . gabapentin  (NEURONTIN ) 100 MG capsule Take by mouth.    . hydrOXYzine  (ATARAX ) 25 MG tablet Take 25 mg by mouth 3 (three) times daily as needed for Anxiety    . iron  polysaccharides (FERREX) 150 mg iron  capsule Take 150 mg by mouth once daily    . meloxicam  (MOBIC ) 7.5 MG tablet Take by mouth.    . midodrine  (PROAMATINE ) 5 MG tablet Take 10 mg by mouth 3 (three) times daily    . naproxen  (NAPROSYN ) 500 MG tablet Take 500 mg by mouth 2 (two) times daily with meals.    . ondansetron  (ZOFRAN -ODT) 8 MG disintegrating tablet Take by mouth.    . polyethylene glycol (MIRALAX ) powder Take 17 g by mouth once daily    . potassium chloride  (KLOR-CON  M20) 20 MEQ ER tablet Take 20 mEq by mouth as directed    . tiZANidine  (ZANAFLEX ) 2 MG tablet Take by mouth.    . TORsemide  (DEMADEX ) 20 MG tablet Take 20 mg by mouth as directed    . traMADoL  (ULTRAM ) 50 mg tablet Take 50 mg by mouth 2 (two) times daily as needed    . traZODone  (DESYREL ) 100 MG tablet Take by mouth.     No facility-administered medications prior to visit.  Objective  There were no vitals filed for this visit. There is no height or weight on file to calculate BMI.  Home Vitals:     Physical Exam Physical Exam MUSCULOSKELETAL: Left hip internal rotation 40 degrees, external 30 degrees. Right hip internal rotation 25 degrees, external 30 degrees. Lumbar spine tender on palpation. Unsteady on forward flexion of spine. NEUROLOGICAL: Achilles reflex 1+.  Constitutional: alert, in NAD, and communicates well   Results LABS A1c: 7.6  RADIOLOGY Lumbar spine X-ray: Severe facet arthritis throughout the low lumbar spine, no evidence of spondylolysis or spondylolisthesis, minimal degenerative disc changes except at L5-S1, normal sacroiliac joints (08/06/2024) Right hip X-ray: Mild to moderate degenerative  changes with a small osteophyte on the femoral head (08/06/2024)     Assessment/Plan:   Assessment & Plan Severe lumbar facet joint osteoarthritis   Severe lumbar facet arthritis in the low lumbar spine shows significant degenerative changes without spondylolysis or spondylolisthesis. Minimal degenerative disc disease is noted except at L5-S1, and SI joints are normal. Pain significantly affects daily activities. A1c is 7.6, which is too high for considering facet joint injections. Order home health physical therapy for lumbar strengthening exercises. Advise reducing A1c below 7.0 to consider facet joint injections with physiatry.  Mild to moderate right hip osteoarthritis   Mild to moderate degenerative changes in the right hip with a small spur on the femoral head. Pain is present. Diagnoses and all orders for this visit:  Lumbar facet arthropathy -     Ambulatory Referral to Physical Therapy  Primary localized osteoarthritis of left knee -     Cancel: X-ray knee left 1 to 2 views; Future -     X-ray hip right 2 or 3 views with or without pelvis; Future  Low back pain, unspecified back pain laterality, unspecified chronicity, unspecified whether sciatica present -     X-ray lumbar spine 4 plus views; Future  Type 2 diabetes mellitus with other specified complication, without long-term current use of insulin  (CMS/HHS-HCC)  Primary localized osteoarthritis of left hip    This visit was coded based on medical decision making (MDM).           Future Appointments   This patient does not currently have any appointments scheduled.     There are no Patient Instructions on file for this visit.  An after visit summary was provided for the patient either in written format (printed) or through [facility].  This note has been created using automated tools and reviewed for accuracy by [facility].

## 2024-08-07 LAB — T-HELPER CELLS CD4/CD8 %
% CD 4 Pos. Lymph.: 13.4 % — ABNORMAL LOW (ref 30.8–58.5)
Absolute CD 4 Helper: 308 /uL — ABNORMAL LOW (ref 359–1519)
Basophils Absolute: 0 x10E3/uL (ref 0.0–0.2)
Basos: 0 %
CD3+CD4+ Cells/CD3+CD8+ Cells Bld: 0.34 — ABNORMAL LOW (ref 0.92–3.72)
CD3+CD8+ Cells # Bld: 897 /uL (ref 109–897)
CD3+CD8+ Cells NFr Bld: 39 % — ABNORMAL HIGH (ref 12.0–35.5)
EOS (ABSOLUTE): 0 x10E3/uL (ref 0.0–0.4)
Eos: 0 %
Hematocrit: 35.1 % (ref 34.0–46.6)
Hemoglobin: 10.9 g/dL — ABNORMAL LOW (ref 11.1–15.9)
Immature Grans (Abs): 0.1 x10E3/uL (ref 0.0–0.1)
Immature Granulocytes: 1 %
Lymphocytes Absolute: 2.3 x10E3/uL (ref 0.7–3.1)
Lymphs: 23 %
MCH: 25.8 pg — ABNORMAL LOW (ref 26.6–33.0)
MCHC: 31.1 g/dL — ABNORMAL LOW (ref 31.5–35.7)
MCV: 83 fL (ref 79–97)
Monocytes Absolute: 0.5 x10E3/uL (ref 0.1–0.9)
Monocytes: 5 %
Neutrophils Absolute: 7.1 x10E3/uL — ABNORMAL HIGH (ref 1.4–7.0)
Neutrophils: 71 %
Platelets: 364 x10E3/uL (ref 150–450)
RBC: 4.23 x10E6/uL (ref 3.77–5.28)
RDW: 15.6 % — ABNORMAL HIGH (ref 11.7–15.4)
WBC: 10.1 x10E3/uL (ref 3.4–10.8)

## 2024-08-08 ENCOUNTER — Ambulatory Visit: Payer: Self-pay

## 2024-08-15 ENCOUNTER — Emergency Department: Payer: MEDICAID

## 2024-08-15 ENCOUNTER — Emergency Department
Admission: EM | Admit: 2024-08-15 | Discharge: 2024-08-15 | Disposition: A | Discharge status: Home / Self Care | Payer: MEDICAID | Attending: Emergency Medicine | Admitting: Emergency Medicine

## 2024-08-15 ENCOUNTER — Other Ambulatory Visit: Payer: Self-pay

## 2024-08-15 DIAGNOSIS — D649 Anemia, unspecified: Secondary | ICD-10-CM | POA: Insufficient documentation

## 2024-08-15 DIAGNOSIS — K602 Anal fissure, unspecified: Secondary | ICD-10-CM | POA: Diagnosis not present

## 2024-08-15 DIAGNOSIS — Z21 Asymptomatic human immunodeficiency virus [HIV] infection status: Secondary | ICD-10-CM | POA: Insufficient documentation

## 2024-08-15 DIAGNOSIS — R1084 Generalized abdominal pain: Secondary | ICD-10-CM | POA: Diagnosis present

## 2024-08-15 DIAGNOSIS — R112 Nausea with vomiting, unspecified: Secondary | ICD-10-CM | POA: Diagnosis not present

## 2024-08-15 DIAGNOSIS — E119 Type 2 diabetes mellitus without complications: Secondary | ICD-10-CM | POA: Diagnosis not present

## 2024-08-15 LAB — COMPREHENSIVE METABOLIC PANEL WITH GFR
ALT: 22 U/L (ref 0–44)
AST: 28 U/L (ref 15–41)
Albumin: 3.5 g/dL (ref 3.5–5.0)
Alkaline Phosphatase: 110 U/L (ref 38–126)
Anion gap: 11 (ref 5–15)
BUN: 25 mg/dL — ABNORMAL HIGH (ref 6–20)
CO2: 23 mmol/L (ref 22–32)
Calcium: 9 mg/dL (ref 8.9–10.3)
Chloride: 105 mmol/L (ref 98–111)
Creatinine, Ser: 1.27 mg/dL — ABNORMAL HIGH (ref 0.44–1.00)
GFR, Estimated: 52 mL/min — ABNORMAL LOW (ref >60)
Glucose, Bld: 113 mg/dL — ABNORMAL HIGH (ref 70–99)
Potassium: 4.7 mmol/L (ref 3.5–5.1)
Sodium: 139 mmol/L (ref 135–145)
Total Bilirubin: 0.5 mg/dL (ref 0.0–1.2)
Total Protein: 7.6 g/dL (ref 6.5–8.1)

## 2024-08-15 LAB — CBC
HCT: 38.2 % (ref 36.0–46.0)
Hemoglobin: 11.7 g/dL — ABNORMAL LOW (ref 12.0–15.0)
MCH: 25.4 pg — ABNORMAL LOW (ref 26.0–34.0)
MCHC: 30.6 g/dL (ref 30.0–36.0)
MCV: 83 fL (ref 80.0–100.0)
Platelets: 357 K/uL (ref 150–400)
RBC: 4.6 MIL/uL (ref 3.87–5.11)
RDW: 16.5 % — ABNORMAL HIGH (ref 11.5–15.5)
WBC: 5 K/uL (ref 4.0–10.5)
nRBC: 0 % (ref 0.0–0.2)

## 2024-08-15 LAB — URINALYSIS, ROUTINE W REFLEX MICROSCOPIC
Bilirubin Urine: NEGATIVE
Glucose, UA: NEGATIVE mg/dL
Hgb urine dipstick: NEGATIVE
Ketones, ur: NEGATIVE mg/dL
Leukocytes,Ua: NEGATIVE
Nitrite: NEGATIVE
Protein, ur: NEGATIVE mg/dL
Specific Gravity, Urine: >1.046 — ABNORMAL HIGH (ref 1.005–1.030)
pH: 5 (ref 5.0–8.0)

## 2024-08-15 LAB — LIPASE, BLOOD: Lipase: 39 U/L (ref 11–51)

## 2024-08-15 LAB — CBG MONITORING, ED: Glucose-Capillary: 173 mg/dL — ABNORMAL HIGH (ref 70–99)

## 2024-08-15 MED ORDER — SODIUM CHLORIDE 0.9 % IV BOLUS
1000.0000 mL | Freq: Once | INTRAVENOUS | Status: AC
  Administered 2024-08-15: 1000 mL via INTRAVENOUS

## 2024-08-15 MED ORDER — SODIUM CHLORIDE 0.9 % IV SOLN
12.5000 mg | Freq: Once | INTRAVENOUS | Status: AC
  Administered 2024-08-15: 12.5 mg via INTRAVENOUS
  Filled 2024-08-15: qty 12.5

## 2024-08-15 MED ORDER — ONDANSETRON HCL 4 MG PO TABS: 4.0000 mg | ORAL_TABLET | Freq: Four times a day (QID) | ORAL | 0 refills | Status: AC | PRN

## 2024-08-15 MED ORDER — IOHEXOL 300 MG/ML  SOLN
100.0000 mL | Freq: Once | INTRAMUSCULAR | Status: AC | PRN
  Administered 2024-08-15: 100 mL via INTRAVENOUS

## 2024-08-15 MED ORDER — ONDANSETRON HCL 4 MG/2ML IJ SOLN
4.0000 mg | Freq: Once | INTRAMUSCULAR | Status: AC
  Administered 2024-08-15: 4 mg via INTRAVENOUS
  Filled 2024-08-15: qty 2

## 2024-08-15 MED ORDER — PROMETHAZINE HCL 12.5 MG PO TABS: 12.5000 mg | ORAL_TABLET | Freq: Four times a day (QID) | ORAL | 0 refills | Status: DC | PRN

## 2024-08-15 MED ORDER — OXYCODONE-ACETAMINOPHEN 5-325 MG PO TABS
1.0000 | ORAL_TABLET | Freq: Once | ORAL | Status: AC
  Administered 2024-08-15: 1 via ORAL
  Filled 2024-08-15: qty 1

## 2024-08-15 NOTE — ED Notes (Signed)
 This RN contacted [facility], states will send someone to pick up pt

## 2024-08-15 NOTE — ED Notes (Signed)
 Attempted to contact legal guardian Delmer Gainer, sent straight to VM. Attempted to call supervisory [employer] at [phone], HIPAA VM left.

## 2024-08-15 NOTE — ED Notes (Signed)
Emesis x1 noted.

## 2024-08-15 NOTE — Discharge Instructions (Addendum)
 You were seen in the Emergency Department today for evaluation of your abdominal pain. Fortunately, your labs, urine test, and CT scan were overall reassuring against an emergency cause for your pain. Please follow-up with your primary care doctor within the next few days for reevaluation.  If you continue to have discomfort in your anal region, have included information for follow-up with surgery.  I sent a prescription for 2 nausea medications to your pharmacy.  The Phenergan  can make you drowsy, do not drive or operate machinery when taking this.  Return to the ER for any new or worsening symptoms including worsening pain, inability to tolerate food or liquids, or any other new or concerning symptoms

## 2024-08-15 NOTE — ED Notes (Signed)
 [employer] notified of arrival

## 2024-08-15 NOTE — ED Provider Notes (Signed)
 [facility] Provider Note    Event Date/Time   First MD Initiated Contact with Patient 08/15/24 [phone]     (approximate)   History   Emesis   HPI  Meghan Jarvis is a 48 year old female with history of diabetes, AIDS, schizophrenia presenting to the emergency department for evaluation of weakness.  Patient reports she has felt generally weak over the past few days with associated lightheadedness.  No syncope.  Has also noticed a small amount of red blood in her stool over the past few days with some pain around her rectum.  Had 2 episodes of nonbloody vomiting today.  Does report generalized abdominal pain.    Physical Exam   Triage Vital Signs: ED Triage Vitals  Encounter Vitals Group     BP 08/15/24 0902 110/72     Girls Systolic BP Percentile --      Girls Diastolic BP Percentile --      Boys Systolic BP Percentile --      Boys Diastolic BP Percentile --      Pulse Rate 08/15/24 0902 (!) 102     Resp 08/15/24 0858 20     Temp 08/15/24 0858 98.1 F (36.7 C)     Temp src --      SpO2 08/15/24 0902 97 %     Weight 08/15/24 0858 252 lb (114.3 kg)     Height 08/15/24 0858 5' 8 (1.727 m)     Head Circumference --      Peak Flow --      Pain Score 08/15/24 0858 5     Pain Loc --      Pain Education --      Exclude from Growth Chart --     Most recent vital signs: Vitals:   08/15/24 1253 08/15/24 1342  BP: 99/66   Pulse: 91   Resp: 17   Temp:  97.8 F (36.6 C)  SpO2: 100%      General: Awake, interactive  CV:  Regular rate, good peripheral perfusion.  Resp:  Unlabored respirations, lungs clear to auscultation Abd:  Nondistended, soft, mild generalized tenderness without rebound or guarding, anal fissure noted on rectal exam without active bleeding, brown stool, Hemoccult negative Neuro:  Symmetric facial movement, fluid speech   ED Results / Procedures / Treatments   Labs (all labs ordered are listed, but only abnormal results are  displayed) Labs Reviewed  COMPREHENSIVE METABOLIC PANEL WITH GFR - Abnormal; Notable for the following components:      Result Value   Glucose, Bld 113 (*)    BUN 25 (*)    Creatinine, Ser 1.27 (*)    GFR, Estimated 52 (*)    All other components within normal limits  CBC - Abnormal; Notable for the following components:   Hemoglobin 11.7 (*)    MCH 25.4 (*)    RDW 16.5 (*)    All other components within normal limits  URINALYSIS, ROUTINE W REFLEX MICROSCOPIC - Abnormal; Notable for the following components:   Color, Urine YELLOW (*)    APPearance CLOUDY (*)    Specific Gravity, Urine >1.046 (*)    Bacteria, UA FEW (*)    All other components within normal limits  CBG MONITORING, ED - Abnormal; Notable for the following components:   Glucose-Capillary 173 (*)    All other components within normal limits  LIPASE, BLOOD     EKG EKG independently reviewed and interpreted by myself demonstrates:  RADIOLOGY Imaging independently reviewed and interpreted by myself demonstrates:  CT abdomen pelvis without obstruction or other acute abnormality  Formal Radiology Read:  CT ABDOMEN PELVIS W CONTRAST Result Date: 08/15/2024 EXAM: CT ABDOMEN AND PELVIS WITH CONTRAST 08/15/2024 10:21:54 AM TECHNIQUE: CT of the abdomen and pelvis was performed with the administration of intravenous contrast. Multiplanar reformatted images are provided for review. Automated exposure control, iterative reconstruction, and/or weight-based adjustment of the mA/kV was utilized to reduce the radiation dose to as low as reasonably achievable. COMPARISON: 05/14/2024 CLINICAL HISTORY: Abdominal pain, acute, nonlocalized. Pt to ED [facility] from [facility] for generalized weakness started today. Reports dark blood in stools for couple days. Emesis x2 today. FINDINGS: LOWER CHEST: Mild lower thoracic spondylosis. LIVER: The liver is unremarkable. GALLBLADDER AND BILE DUCTS: Gallbladder is unremarkable. No biliary ductal  dilatation. SPLEEN: No acute abnormality. PANCREAS: No acute abnormality. ADRENAL GLANDS: No acute abnormality. KIDNEYS, URETERS AND BLADDER: No stones in the kidneys or ureters. No hydronephrosis. No perinephric or periureteral stranding. Urinary bladder is unremarkable. GI AND BOWEL: Appendectomy. No dilated or thick walled small bowel loops. No large bowel wall thickening, diverticulosis or acute pericolonic fat stranding. PERITONEUM AND RETROPERITONEUM: No ascites. No free air. Stable postsurgical changes from ventral midline abdominal hernia mesh repair without recurrent ventral abdominal hernia. VASCULATURE: Aorta is normal in caliber. LYMPH NODES: No lymphadenopathy. REPRODUCTIVE ORGANS: Hysterectomy. No acute abnormality. BONES AND SOFT TISSUES: No acute osseous abnormality. No focal soft tissue abnormality. IMPRESSION: 1. No acute findings in the abdomen or pelvis. Electronically signed by: Selinda Blue MD 08/15/2024 10:30 AM EDT RP Workstation: [id number]    PROCEDURES:  Critical Care performed: No  Procedures   MEDICATIONS ORDERED IN ED: Medications  sodium chloride  0.9 % bolus 1,000 mL (0 mLs Intravenous Stopped 08/15/24 1224)  ondansetron  (ZOFRAN ) injection 4 mg (4 mg Intravenous Given 08/15/24 0930)  iohexol  (OMNIPAQUE ) 300 MG/ML solution 100 mL (100 mLs Intravenous Contrast Given 08/15/24 1010)  oxyCODONE -acetaminophen  (PERCOCET/ROXICET) 5-325 MG per tablet 1 tablet (1 tablet Oral Given 08/15/24 1111)  promethazine  (PHENERGAN ) 12.5 mg in sodium chloride  0.9 % 50 mL IVPB (0 mg Intravenous Stopped 08/15/24 1342)     IMPRESSION / MDM / ASSESSMENT AND PLAN / ED COURSE  I reviewed the triage vital signs and the nursing notes.  Differential diagnosis includes, but is not limited to, anemia, electrolyte abnormality, DKA, hyperglycemia, pancreatitis, colitis, other acute intra-abdominal process, arrhythmia  Patient's presentation is most consistent with acute presentation with potential threat to  life or bodily function.  48 year old female presenting to the emergency department for evaluation of generalized weakness with associated vomiting, lightheadedness.  Patient reports noted blood when wiping, does have what appears to be an anal fissure on exam, Hemoccult negative stool.  Suspect likely intermittent anorectal bleeding related to her fissure.  Will obtain labs, CT to further evaluate.  Will treat symptomatically with IV fluids, Zofran .  Labs with mild anemia with hemoglobin 11.7, but consistent with recent priors, low suspicion for significant GI bleeding.  CMP with mild AKI likely related to vomiting.  Urinalysis overall not suggestive of infection.  Normal lipase.   Clinical Course as of 08/15/24 1405  Fri Aug 15, 2024  1059 Patient reassessed.  Reports improved nausea without further vomiting, does report some ongoing abdominal pain.  Ordered for Percocet for pain control.  Will trial p.o. challenge.  If patient tolerates this, suspect she will be stable for discharge and outpatient follow-up. Urine also still pending.  [NR]  1401  Patient did have an episode of vomiting after initial p.o. trial, but given a dose of Phenergan  and was able to tolerate p.o. without recurrent vomiting.  Feels improved on reevaluation.  She is comfortable discharge home.  Low suspicion emergent process.  Strict return precautions provided.  Patient discharged stable condition. [NR]    Clinical Course User Index [NR] Levander Slate, MD     FINAL CLINICAL IMPRESSION(S) / ED DIAGNOSES   Final diagnoses:  Nausea and vomiting, unspecified vomiting type  Generalized abdominal pain  Anal fissure     Rx / DC Orders   ED Discharge Orders          Ordered    ondansetron  (ZOFRAN ) 4 MG tablet  Every 6 hours PRN        08/15/24 1405    promethazine  (PHENERGAN ) 12.5 MG tablet  Every 6 hours PRN        08/15/24 1405             Note:  This document was prepared using Dragon voice recognition  software and may include unintentional dictation errors.   Levander Slate, MD 08/15/24 [phone]

## 2024-08-15 NOTE — ED Notes (Signed)
 Attempted to notify legal guardian's supervisor about d/c. No answer. HIPAA VM left.

## 2024-08-15 NOTE — ED Notes (Signed)
 Pt given crackers and pb for PO challenge

## 2024-08-15 NOTE — ED Notes (Signed)
 Pt calling cousin for ride to group home

## 2024-08-15 NOTE — ED Triage Notes (Signed)
 Pt to ED [facility] from [facility] for generalized weakness started today. Reports dark blood in stools for couple days. Emesis x2 today

## 2024-08-15 NOTE — ED Notes (Signed)
 Pt given crackers, PB for PO challenge

## 2024-08-19 ENCOUNTER — Emergency Department: Payer: MEDICAID

## 2024-08-19 ENCOUNTER — Other Ambulatory Visit: Payer: Self-pay

## 2024-08-19 ENCOUNTER — Emergency Department
Admission: EM | Admit: 2024-08-19 | Discharge: 2024-08-19 | Disposition: A | Discharge status: Home / Self Care | Payer: MEDICAID

## 2024-08-19 DIAGNOSIS — B20 Human immunodeficiency virus [HIV] disease: Secondary | ICD-10-CM | POA: Insufficient documentation

## 2024-08-19 DIAGNOSIS — G8929 Other chronic pain: Secondary | ICD-10-CM | POA: Insufficient documentation

## 2024-08-19 DIAGNOSIS — M545 Low back pain, unspecified: Secondary | ICD-10-CM | POA: Insufficient documentation

## 2024-08-19 DIAGNOSIS — E119 Type 2 diabetes mellitus without complications: Secondary | ICD-10-CM | POA: Diagnosis not present

## 2024-08-19 LAB — URINALYSIS, ROUTINE W REFLEX MICROSCOPIC
Bilirubin Urine: NEGATIVE
Glucose, UA: NEGATIVE mg/dL
Hgb urine dipstick: NEGATIVE
Ketones, ur: NEGATIVE mg/dL
Leukocytes,Ua: NEGATIVE
Nitrite: NEGATIVE
Protein, ur: NEGATIVE mg/dL
Specific Gravity, Urine: 1.024 (ref 1.005–1.030)
pH: 5 (ref 5.0–8.0)

## 2024-08-19 LAB — COMPREHENSIVE METABOLIC PANEL WITH GFR
ALT: 16 U/L (ref 0–44)
AST: 22 U/L (ref 15–41)
Albumin: 3.7 g/dL (ref 3.5–5.0)
Alkaline Phosphatase: 114 U/L (ref 38–126)
Anion gap: 9 (ref 5–15)
BUN: 18 mg/dL (ref 6–20)
CO2: 25 mmol/L (ref 22–32)
Calcium: 9.1 mg/dL (ref 8.9–10.3)
Chloride: 107 mmol/L (ref 98–111)
Creatinine, Ser: 1.31 mg/dL — ABNORMAL HIGH (ref 0.44–1.00)
GFR, Estimated: 50 mL/min — ABNORMAL LOW (ref >60)
Glucose, Bld: 94 mg/dL (ref 70–99)
Potassium: 4.4 mmol/L (ref 3.5–5.1)
Sodium: 141 mmol/L (ref 135–145)
Total Bilirubin: 0.7 mg/dL (ref 0.0–1.2)
Total Protein: 8.2 g/dL — ABNORMAL HIGH (ref 6.5–8.1)

## 2024-08-19 LAB — CBC WITH DIFFERENTIAL/PLATELET
Abs Immature Granulocytes: 0.01 K/uL (ref 0.00–0.07)
Basophils Absolute: 0.1 K/uL (ref 0.0–0.1)
Basophils Relative: 1 %
Eosinophils Absolute: 0.4 K/uL (ref 0.0–0.5)
Eosinophils Relative: 6 %
HCT: 38.7 % (ref 36.0–46.0)
Hemoglobin: 11.9 g/dL — ABNORMAL LOW (ref 12.0–15.0)
Immature Granulocytes: 0 %
Lymphocytes Relative: 36 %
Lymphs Abs: 2.1 K/uL (ref 0.7–4.0)
MCH: 25.4 pg — ABNORMAL LOW (ref 26.0–34.0)
MCHC: 30.7 g/dL (ref 30.0–36.0)
MCV: 82.7 fL (ref 80.0–100.0)
Monocytes Absolute: 0.6 K/uL (ref 0.1–1.0)
Monocytes Relative: 10 %
Neutro Abs: 2.8 K/uL (ref 1.7–7.7)
Neutrophils Relative %: 47 %
Platelets: 323 K/uL (ref 150–400)
RBC: 4.68 MIL/uL (ref 3.87–5.11)
RDW: 16.4 % — ABNORMAL HIGH (ref 11.5–15.5)
WBC: 5.9 K/uL (ref 4.0–10.5)
nRBC: 0 % (ref 0.0–0.2)

## 2024-08-19 MED ORDER — KETOROLAC TROMETHAMINE 30 MG/ML IJ SOLN
30.0000 mg | Freq: Once | INTRAMUSCULAR | Status: AC
  Administered 2024-08-19: 30 mg via INTRAVENOUS
  Filled 2024-08-19: qty 1

## 2024-08-19 MED ORDER — CYCLOBENZAPRINE HCL 10 MG PO TABS
10.0000 mg | ORAL_TABLET | Freq: Once | ORAL | Status: AC
  Administered 2024-08-19: 10 mg via ORAL
  Filled 2024-08-19: qty 1

## 2024-08-19 MED ORDER — DEXAMETHASONE SODIUM PHOSPHATE 10 MG/ML IJ SOLN
10.0000 mg | Freq: Once | INTRAMUSCULAR | Status: AC
  Administered 2024-08-19: 10 mg via INTRAVENOUS
  Filled 2024-08-19: qty 1

## 2024-08-19 MED ORDER — HYDROCODONE-ACETAMINOPHEN 7.5-325 MG/15ML PO SOLN
10.0000 mL | Freq: Once | ORAL | Status: AC
  Administered 2024-08-19: 10 mL via ORAL
  Filled 2024-08-19: qty 15

## 2024-08-19 MED ORDER — CYCLOBENZAPRINE HCL 10 MG PO TABS: 10.0000 mg | ORAL_TABLET | Freq: Three times a day (TID) | ORAL | 0 refills | Status: AC | PRN

## 2024-08-19 MED ORDER — LIDOCAINE 5 % EX PTCH: 1.0000 | MEDICATED_PATCH | CUTANEOUS | 0 refills | Status: AC

## 2024-08-19 MED ORDER — MELOXICAM 15 MG PO TABS: 15.0000 mg | ORAL_TABLET | Freq: Every day | ORAL | 0 refills | Status: AC

## 2024-08-19 MED ORDER — LIDOCAINE 5 % EX PTCH
1.0000 | MEDICATED_PATCH | CUTANEOUS | Status: DC
  Administered 2024-08-19: 1 via TRANSDERMAL
  Filled 2024-08-19: qty 1

## 2024-08-19 NOTE — ED Notes (Signed)
 MD aware pt crying out in pain. Medication ordered.

## 2024-08-19 NOTE — ED Notes (Signed)
 [facility] PA aware pt requesting pain medication.

## 2024-08-19 NOTE — ED Triage Notes (Signed)
 Pt arrives via [facility] from [facility] with c/o back pain for 2-3 months that is getting worser. Pt states that it's making her lose her balance. Pt is A&Ox4 during triage.

## 2024-08-19 NOTE — ED Notes (Signed)
 [facility] PA spoke with pt legal guardian named Medford who states is picking up patient

## 2024-08-19 NOTE — ED Provider Notes (Signed)
 [facility] Provider Note    Event Date/Time   First MD Initiated Contact with Patient 08/19/24 1855     (approximate)   History   Back Pain    HPI  Meghan Jarvis is a 48 y.o. female    with a past medical history of HIV, diabetes type 2, AIDS, cocaine abuse, crack cocaine abuse,  who presents to the ED complaining of back pain. According to the patient symptoms started 3 months ago with lower back pain that is getting worse.  Patient endorses dysuria, frequency, lower abdominal pain, chills, sweats, nauseas, decreased appetite.  Patient is able to walk.     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) 12/12/2023   Protein calorie malnutrition (HCC) 12/10/2023   Substance induced mood disorder (HCC) 11/30/2023   Crack cocaine use 11/30/2023   Homeless 11/28/2023   HIV (human immunodeficiency virus infection) (HCC)    Alcohol use disorder, severe, dependence (HCC) 05/18/2016   Tobacco abuse 08/10/2014   Bacterial vaginosis 04/15/2013   Chronic pelvic pain in female 04/04/2013   Cocaine abuse in remission (HCC) 10/19/2009   Migraine 12/30/1997   Asthma 12/30/1997     ROS: Patient currently denies any vision changes, tinnitus, difficulty speaking, facial droop, sore throat, chest pain, shortness of breath, abdominal pain, nausea/vomiting/diarrhea, dysuria, or weakness/numbness/paresthesias in any  extremity   Physical Exam   Triage Vital Signs: ED Triage Vitals  Encounter Vitals Group     BP 08/19/24 1828 92/68     Girls Systolic BP Percentile --      Girls Diastolic BP Percentile --      Boys Systolic BP Percentile --      Boys Diastolic BP Percentile --      Pulse Rate 08/19/24 1828 96     Resp 08/19/24 1828 18     Temp 08/19/24 1828 98.5 F (36.9 C)     Temp Source 08/19/24 1828 Oral     SpO2 08/19/24 1828 95 %     Weight 08/19/24 1831 250 lb (113.4 kg)     Height 08/19/24 1831 5' 8 (1.727 m)     Head Circumference --      Peak Flow --      Pain Score 08/19/24 1829 10     Pain Loc --      Pain Education --      Exclude from Growth Chart --     Most recent vital signs: Vitals:   08/19/24 1828  BP: 92/68  Pulse: 96  Resp: 18  Temp: 98.5 F (36.9 C)  SpO2: 95%     Physical Exam Vitals and nursing note reviewed.  During triage vital signs were normal  Constitutional:      General: Awake and alert.  With mild acute distress due to pain    Appearance: Normal appearance. The patient is normal weight.      Able to speak in complete sentences without cough or dyspnea  HENT:  Head: Normocephalic and atraumatic.     Mouth: Mucous membranes are moist.  Eyes:     General: PERRL. Normal EOMs          Conjunctiva/sclera: Conjunctivae normal.  Nose No congestion/rhinorrhea  CV:                  Good peripheral perfusion.  Regular rate and rhythm  Resp:               Normal effort.  Equal breath sounds bilaterally.  Abd:                 No distention.  Soft, tender to palpation in suprapubic area, no rebound or guarding.  Musculoskeletal:        General: No swelling. Normal range of motion.  Lumbar spine: Skin is intact, tenderness to palpation in the spinal process, and paraspinal muscles.  Skin:    General: Skin is warm and dry.     Capillary Refill: Capillary refill takes less than 2 seconds.     Findings: No rash.  Neurological:     Mental Status:  The patient is awake and alert. MAE spontaneously. No gross focal neurologic deficits are appreciated.  Psychiatric Mood and affect are normal. Speech and behavior are normal.  ED Results / Procedures / Treatments   Labs (all labs ordered are listed, but only abnormal results are displayed) Labs Reviewed  COMPREHENSIVE METABOLIC PANEL WITH GFR - Abnormal; Notable for the following components:      Result Value   Creatinine, Ser 1.31 (*)    Total Protein 8.2 (*)    GFR, Estimated 50 (*)    All other components within normal limits  CBC WITH DIFFERENTIAL/PLATELET - Abnormal; Notable for the following components:   Hemoglobin 11.9 (*)    MCH 25.4 (*)    RDW 16.4 (*)    All other components within normal limits  URINALYSIS, ROUTINE W REFLEX MICROSCOPIC - Abnormal; Notable for the following components:   Color, Urine YELLOW (*)    APPearance HAZY (*)    All other components within normal limits     EKG See physician read    RADIOLOGY I independently reviewed and interpreted imaging and agree with radiologists findings.      PROCEDURES:  Critical Care performed:   Procedures   MEDICATIONS ORDERED IN ED: Medications  lidocaine  (LIDODERM ) 5 % 1 patch (1 patch Transdermal Patch Applied 08/19/24 1944)  ketorolac  (TORADOL ) 30 MG/ML injection 30 mg (30 mg Intravenous Given 08/19/24 1951)  cyclobenzaprine  (FLEXERIL ) tablet 10 mg (10 mg Oral Given 08/19/24 1943)  HYDROcodone -acetaminophen  (HYCET) 7.5-325 mg/15 ml solution 10 mL (10 mLs Oral Given 08/19/24 2137)  dexamethasone  (DECADRON ) injection 10 mg (10 mg Intravenous Given 08/19/24 2100)   Clinical Course as of 08/19/24 2142  Tue Aug 19, 2024  1916 Six non-rib-bearing vertebra. No acute fracture or subluxation of the lumbar spine. Mild lower lumbar facet arthropathy. The visualized posterior elements are intact. The soft tissues are unremarkable.     [AE]  2044 Comprehensive metabolic panel(!) Electrolytes within normal  limits, creatinine elevated 1.31, GFR decreased 50 [AE]  2044 CBC with Differential(!) Anemia, hemoglobin 11.9, white blood cells and platelets within normal limits [AE]  2045 Urinalysis, Routine w reflex microscopic -Urine, Clean Catch(!) Negative for UTI [AE]  2053 Updated patient with results of CMP CBC UA and x-ray.  Patient is asking for drink and crackers [AE]  2135 Reassessed the patient.  Patient is here with  her son.  Patient feels better.  Patient has an appointment with orthopedics in [place] [facility] for her lumbar pain.  Patient is ready for discharge with meloxicam , Flexeril , lidocaine  patch. [AE]    Clinical Course User Index [AE] Janit Kast, PA-C    IMPRESSION / MDM / ASSESSMENT AND PLAN / ED COURSE  I reviewed the triage vital signs and the nursing notes.  Differential diagnosis includes, but is not limited to, UTI, pyelonephritis, spondylolithiasis, lumbar sprain  Patient's presentation is most consistent with acute complicated illness / injury requiring diagnostic workup.   KALECIA HARTNEY is a 48 y.o., female who presents today with history of 3 months of lower back pain associated to dysuria, frequency, chills, anorexia, sweats.  On physical exam patient is in mild distress due to back pain, patient is able to walk.  Lumbar spine tenderness to palpation in spinal process and paraspinal muscles.  Abdomen tender to palpation in suprapubic area, no rebound, no guarding.  Rest of physical exam is normal. Plan Lidocaine  patch Flexeril  Toradol  Steroids CBC, CMP, UA Reassess Reassessed the patient, patient is here with her son.  Patient feels better from her pain, she has an appointment with orthopedics at [facility] next week. Patient's diagnosis is consistent with lower back pain. I independently reviewed and interpreted imaging and agree with radiologists findings. Labs are  reassuring. I did review the patient's allergies and medications.The patient is in  stable and satisfactory condition for discharge home  Patient will be discharged home with prescriptions for Flexeril , meloxicam , lidocaine  patch. Patient is to follow up with orthopedics as needed or otherwise directed. Patient is given ED precautions to return to the ED for any worsening or new symptoms. Discussed plan of care with patient, answered all of patient's questions, Patient agreeable to plan of care. Advised patient to take medications according to the instructions on the label. Discussed possible side effects of new medications. Patient verbalized understanding.   FINAL CLINICAL IMPRESSION(S) / ED DIAGNOSES   Final diagnoses:  Chronic right-sided low back pain, unspecified whether sciatica present     Rx / DC Orders   ED Discharge Orders          Ordered    cyclobenzaprine  (FLEXERIL ) 10 MG tablet  3 times daily PRN        08/19/24 2141    lidocaine  (LIDODERM ) 5 %  Every 24 hours        08/19/24 2141    meloxicam  (MOBIC ) 15 MG tablet  Daily        08/19/24 2141             Note:  This document was prepared using Dragon voice recognition software and may include unintentional dictation errors.   Janit Kast, PA-C 08/19/24 2142    Nicholaus Rolland BRAVO, MD 08/19/24 [phone]

## 2024-08-19 NOTE — Discharge Instructions (Addendum)
 You have been diagnosed with low back pain.  Please take Flexeril  1 tablet by mouth every 8 hours.  Avoid driving while taking Flexeril .  Apply lidocaine  patch 1 patch in the lumbar area every 12 hours.  Please take meloxicam  1 tablet with breakfast.  Drink plenty fluids.  Come back to ED or go to your PCP if you have new symptoms symptoms worsen

## 2024-08-24 ENCOUNTER — Other Ambulatory Visit: Payer: Self-pay

## 2024-08-24 ENCOUNTER — Emergency Department
Admission: EM | Admit: 2024-08-24 | Discharge: 2024-08-25 | Disposition: A | Discharge status: Home / Self Care | Payer: MEDICAID | Attending: Emergency Medicine | Admitting: Emergency Medicine

## 2024-08-24 ENCOUNTER — Encounter: Payer: Self-pay | Admitting: Emergency Medicine

## 2024-08-24 DIAGNOSIS — F17200 Nicotine dependence, unspecified, uncomplicated: Secondary | ICD-10-CM | POA: Insufficient documentation

## 2024-08-24 DIAGNOSIS — E119 Type 2 diabetes mellitus without complications: Secondary | ICD-10-CM | POA: Insufficient documentation

## 2024-08-24 DIAGNOSIS — G8929 Other chronic pain: Secondary | ICD-10-CM | POA: Diagnosis not present

## 2024-08-24 DIAGNOSIS — Z21 Asymptomatic human immunodeficiency virus [HIV] infection status: Secondary | ICD-10-CM | POA: Insufficient documentation

## 2024-08-24 DIAGNOSIS — J45909 Unspecified asthma, uncomplicated: Secondary | ICD-10-CM | POA: Diagnosis not present

## 2024-08-24 DIAGNOSIS — M545 Low back pain, unspecified: Secondary | ICD-10-CM | POA: Insufficient documentation

## 2024-08-24 MED ORDER — PREDNISONE 50 MG PO TABS: 50.0000 mg | ORAL_TABLET | Freq: Every day | ORAL | 0 refills | Status: AC

## 2024-08-24 MED ORDER — PREDNISONE 20 MG PO TABS
60.0000 mg | ORAL_TABLET | Freq: Once | ORAL | Status: AC
  Administered 2024-08-24: 60 mg via ORAL
  Filled 2024-08-24: qty 3

## 2024-08-24 MED ORDER — CYCLOBENZAPRINE HCL 10 MG PO TABS
10.0000 mg | ORAL_TABLET | Freq: Once | ORAL | Status: AC
  Administered 2024-08-24: 10 mg via ORAL
  Filled 2024-08-24: qty 1

## 2024-08-24 MED ORDER — KETOROLAC TROMETHAMINE 30 MG/ML IJ SOLN
30.0000 mg | Freq: Once | INTRAMUSCULAR | Status: DC
  Filled 2024-08-24: qty 1

## 2024-08-24 MED ORDER — NAPROXEN 500 MG PO TBEC: 500.0000 mg | DELAYED_RELEASE_TABLET | Freq: Two times a day (BID) | ORAL | 0 refills | Status: AC

## 2024-08-24 MED ORDER — HYDROCODONE-ACETAMINOPHEN 5-325 MG PO TABS
1.0000 | ORAL_TABLET | Freq: Once | ORAL | Status: AC
  Administered 2024-08-24: 1 via ORAL
  Filled 2024-08-24: qty 1

## 2024-08-24 MED ORDER — KETOROLAC TROMETHAMINE 60 MG/2ML IM SOLN
30.0000 mg | Freq: Once | INTRAMUSCULAR | Status: AC
  Administered 2024-08-24: 30 mg via INTRAMUSCULAR

## 2024-08-24 NOTE — Discharge Instructions (Addendum)
 You have been diagnosed with chronic low back pain, please take naproxen  1 tablet every 12 hours after main meals.  Please take prednisone  1 tablet with breakfast.  Please call [facility] and make an appointment for a follow-up.  Come back to ED or go to your PCP if you have new symptoms or symptoms worsen

## 2024-08-24 NOTE — ED Notes (Addendum)
 Per on call social worker, call ALF director, Rosana Later [phone], for ride home. Called [address], no answer, mailbox is full.

## 2024-08-24 NOTE — ED Notes (Signed)
 Legal guardian in chart did not answer, per her voicemail, called [employer] and awaiting call back from on call social worker.

## 2024-08-24 NOTE — ED Provider Notes (Signed)
 [facility] Provider Note    Event Date/Time   First MD Initiated Contact with Patient 08/24/24 2111     (approximate)   History   Back Pain    HPI  Meghan Jarvis is a 48 y.o. female    with a past medical history of HIV, diabetes type 2, AIDS, cocaine abuse, chronic back pain  who presents to the ED complaining of back pain. According to the patient, lower back pain is getting worse, increases with walking.  Denies urinary incontinence, urinary retention, fecal incontinence.  No history of trauma.  Patient was seen here a week ago with same symptoms.  Son is here with her.    Patient Active Problem List   Diagnosis Date Noted  . Type 2 diabetes mellitus with other specified complication (HCC) 07/15/2024  . Hyperglycemia 07/13/2024  . Class 2 obesity 07/12/2024  . Grade I diastolic dysfunction 07/12/2024  . Weight gain 05/05/2024  . Protein-calorie malnutrition, severe 03/23/2024  . Encounter for assessment of decision-making capacity 03/22/2024  . AIDS (acquired immune deficiency syndrome) (HCC) 02/21/2024  . Right upper quadrant abdominal pain 12/14/2023  . Cocaine abuse (HCC) 12/12/2023  . 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 Protein calorie malnutrition (HCC) 12/10/2023  . Substance induced mood disorder (HCC) 11/30/2023  . Crack cocaine use 11/30/2023  . Homeless 11/28/2023  . HIV (human immunodeficiency virus infection) (HCC)   . Alcohol use disorder, severe, dependence (HCC) 05/18/2016  . Tobacco abuse 08/10/2014  . Bacterial vaginosis 04/15/2013  . Chronic pelvic pain in female 04/04/2013  . Cocaine abuse in remission (HCC) 10/19/2009  . Migraine 12/30/1997  . Asthma 12/30/1997     ROS: Patient currently denies any vision changes, tinnitus, difficulty speaking, facial droop, sore throat, chest pain, shortness of breath, abdominal pain, nausea/vomiting/diarrhea, dysuria, or  weakness/numbness/paresthesias in any extremity   Physical Exam   Triage Vital Signs: ED Triage Vitals [08/24/24 1809]  Encounter Vitals Group     BP 112/77     Girls Systolic BP Percentile      Girls Diastolic BP Percentile      Boys Systolic BP Percentile      Boys Diastolic BP Percentile      Pulse Rate 87     Resp 18     Temp 98.7 F (37.1 C)     Temp Source Oral     SpO2 99 %     Weight      Height      Head Circumference      Peak Flow      Pain Score 10     Pain Loc      Pain Education      Exclude from Growth Chart     Most recent vital signs: Vitals:   08/24/24 1809 08/24/24 2125  BP: 112/77 (!) 88/49  Pulse: 87 90  Resp: 18 20  Temp: 98.7 F (37.1 C) 98.2 F (36.8 C)  SpO2: 99% 98%     Physical Exam Vitals and nursing note reviewed.  During triage vital signs were normal  Constitutional:      General: Awake and alert. No acute distress.    Appearance: Normal appearance. The patient is normal weight.      Able to speak in complete sentences without cough or dyspnea  HENT:     Head: Normocephalic and atraumatic.     Mouth: Mucous membranes are moist.  Eyes:  General: PERRL. Normal EOMs          Conjunctiva/sclera: Conjunctivae normal.  Nose No congestion/rhinorrhea  CV:                  Good peripheral perfusion.  Regular rate and rhythm  Resp:               Normal effort.  Equal breath sounds bilaterally.  Abd:                 No distention.  Soft, nontender.  No rebound or guarding.  Musculoskeletal:        General: No swelling. Normal range of motion.  Lumbar spine: Skin is intact, no ecchymosis no hematomas.  No tenderness to spinal process, tenderness to palpation in right and left paraspinal muscles.  Negative bilateral SLR.  Extremities: No tenderness to palpation.  Pulses positive.  Sensation intact. Skin:    General: Skin is warm and dry.     Capillary Refill: Capillary refill takes less than 2 seconds.     Findings: No rash.   Neurological:     Mental Status: The patient is awake and alert. MAE spontaneously. No gross focal neurologic deficits are appreciated.  Psychiatric Mood and affect are normal. Speech and behavior are normal.  ED Results / Procedures / Treatments   Labs (all labs ordered are listed, but only abnormal results are displayed) Labs Reviewed - No data to display   EKG   PROCEDURES:  Critical Care performed:   Procedures   MEDICATIONS ORDERED IN ED: Medications  HYDROcodone -acetaminophen  (NORCO/VICODIN) 5-325 MG per tablet 1 tablet (has no administration in time range)  cyclobenzaprine  (FLEXERIL ) tablet 10 mg (has no administration in time range)  predniSONE  (DELTASONE ) tablet 60 mg (60 mg Oral Given 08/24/24 2222)  ketorolac  (TORADOL ) injection 30 mg (30 mg Intramuscular Given 08/24/24 2226)   Clinical Course as of 08/24/24 2336  Sun Aug 24, 2024  2252 Reassessed the patient, patient states she continues with back pain.  I will order Flexeril , Norco. [AE]  2336 Reassessed the patient, patient endorses feeling better.  Patient is ready for discharge.  Patient is agreeable with the plan. [AE]    Clinical Course User Index [AE] Janit Kast, PA-C    IMPRESSION / MDM / ASSESSMENT AND PLAN / ED COURSE  I reviewed the triage vital signs and the nursing notes.  Differential diagnosis includes, but is not limited to, lower lumbar back pain, sciatica.  Unlikely cauda equina. Patient's presentation is most consistent with acute, uncomplicated illness.  Meghan Jarvis is a 48 y.o., female who presents today with history of worsening of lower back pain.  Patient was seen here last week for same symptoms.  Patient denies urinary incontinence, urinary retention, fecal incontinence.  Patient states pain increased with walking.  On a physical exam tenderness to palpation in lumbar paraspinal muscles.  No signs of saddle anesthesia.  Negative bilateral SLR. Rest of physical exam is  normal. Plan IV Toradol  Prednisone  Recheck blood pressure Reassess Patient's diagnosis is consistent with chronic medial low back pain without sciatica. I did not order any imaging or labs, physical exam was reassuring. I did review the patient's allergies and medications.The patient is in stable and satisfactory condition for discharge home  Patient will be discharged home with prescriptions for prednisone , naproxen . Patient is to follow up with [facility], PCP as needed or otherwise directed. Patient is given ED precautions to return to the ED for any worsening or  new symptoms. Discussed plan of care with patient, answered all of patient's questions, Patient agreeable to plan of care. Advised patient to take medications according to the instructions on the label. Discussed possible side effects of new medications. Patient verbalized understanding.   FINAL CLINICAL IMPRESSION(S) / ED DIAGNOSES   Final diagnoses:  Chronic midline low back pain without sciatica     Rx / DC Orders   ED Discharge Orders          Ordered    predniSONE  (DELTASONE ) 50 MG tablet  Daily with breakfast        08/24/24 2302    naproxen  (EC NAPROSYN ) 500 MG EC tablet  2 times daily with meals        08/24/24 2302             Note:  This document was prepared using Dragon voice recognition software and may include unintentional dictation errors.   Janit Kast, PA-C 08/24/24 2336    Floy Roberts, MD 08/28/24 [phone]

## 2024-08-24 NOTE — ED Triage Notes (Signed)
 Pt reports lower right sided back that radiates down her right leg. Pt reports she was seen for the same 1 month ago and was diagnosed with sciatica. PT from [facility].

## 2024-08-25 NOTE — ED Notes (Signed)
 Contacted Rosana Later with group home - he is in route to provide the pt a ride back to group home. Primary RN made aware.

## 2024-09-16 ENCOUNTER — Ambulatory Visit: Payer: MEDICAID | Attending: Infectious Diseases | Admitting: Infectious Diseases

## 2024-09-16 ENCOUNTER — Encounter: Payer: Self-pay | Admitting: Infectious Diseases

## 2024-09-16 VITALS — BP 106/75 | HR 103 | Temp 98.0°F | Ht 68.0 in | Wt 273.0 lb

## 2024-09-16 DIAGNOSIS — B20 Human immunodeficiency virus [HIV] disease: Secondary | ICD-10-CM | POA: Diagnosis present

## 2024-09-16 DIAGNOSIS — F919 Conduct disorder, unspecified: Secondary | ICD-10-CM | POA: Diagnosis not present

## 2024-09-16 DIAGNOSIS — E119 Type 2 diabetes mellitus without complications: Secondary | ICD-10-CM | POA: Diagnosis not present

## 2024-09-16 DIAGNOSIS — R635 Abnormal weight gain: Secondary | ICD-10-CM

## 2024-09-16 DIAGNOSIS — Z7984 Long term (current) use of oral hypoglycemic drugs: Secondary | ICD-10-CM | POA: Diagnosis not present

## 2024-09-16 DIAGNOSIS — Z79624 Long term (current) use of inhibitors of nucleotide synthesis: Secondary | ICD-10-CM | POA: Diagnosis not present

## 2024-09-16 DIAGNOSIS — F209 Schizophrenia, unspecified: Secondary | ICD-10-CM | POA: Insufficient documentation

## 2024-09-16 DIAGNOSIS — Z23 Encounter for immunization: Secondary | ICD-10-CM

## 2024-09-16 NOTE — Progress Notes (Signed)
 NAME: Meghan Jarvis  DOB: 05/15/1976  MRN: [id number]  Date/Time: 09/16/2024 10:30 AM   Subjective:   ?clegail professional services Pt here with group home care giver Candis Hocking PCP- [phone]  Since last visit has gained another 20 pounds  Meghan Jarvis is a 48 y.o. with a history of AIDS, newly diagnosed DM, H/o cocaine use, schizophrenia HIV diagnosed in 2021 Nadir Cd4 -12 on 12/09/23 VL  20 million 12/09/23 OI  HAARt history Biktarvy  Dovato   Proloned hospitalization at [facility] 12/08/23  -05/07/24 For acute resp failure, cocaine abuse, intubated , Brain MRI cerebral atrophy, after extubation on 12/31 started on Biktarvy  which was changed to Dovato  on 5/27 because of 100 pound weight gain. She was in the hospital longer because of disposition issue, incapacity to make any decisions, DSS involved and she was sent to group home While in the hospital she was extensively investigated for Ois Toxo IgG > 400 Toxo PCR neg Toxo igM neg RPR NR CMV DNA neg Crypto neg HIV RNA 2 million>> 34, 000 Cd4 is 14 ( 2.4%) repeat on 1/29 is 416 ( 23%) Beta D glucan > 500 (  repeated) negative Histoplasma neg Fungal antibodies negative Genosure prime- no resistant mutations QuantiFERON gold indeterminate.  No treatment given .  Can repeat it later.  She also had rt hip pain and low back pain and had MRI of the lumbar spine and rt hip did not show any significant findings other that intramuscular edema and enhancement of rt Gluteus  Ptwas recently in hospital 8/2-8/3 with muscle cramps and diagnosed ot have BS of 500, DC on metformin  She has been eating lot of sugary products- She iuse dot be a cocaine user and now has replaced with sugar- cookies , cakes, MM etx Has gain 45 pounds pounds since discharge ? Past Medical History:  Diagnosis Date   Asthma    Depression    HIV (human immunodeficiency virus infection) (HCC)    Cocaine use Failue to thrive  Past Surgical History:   Procedure Laterality Date   ABDOMINAL HYSTERECTOMY     APPENDECTOMY      Social History   Socioeconomic History   Marital status: Divorced    Spouse name: Not on file   Number of children: Not on file   Years of education: Not on file   Highest education level: Not on file  Occupational History   Not on file  Tobacco Use   Smoking status: Every Day    Current packs/day: 1.00    Types: Cigarettes   Smokeless tobacco: Never  Vaping Use   Vaping status: Former  Substance and Sexual Activity   Alcohol use: Not Currently   Drug use: Not Currently    Types: Cocaine   Sexual activity: Yes    Birth control/protection: Surgical  Other Topics Concern   Not on file  Social History Narrative   Not on file   Social Drivers of Health   Financial Resource Strain: Low Risk  (09/01/2024)   Received from [facility]   Overall Financial Resource Strain (CARDIA)    Difficulty of Paying Living Expenses: Not hard at all  Food Insecurity: No Food Insecurity (08/06/2024)   Received from [facility]   Hunger Vital Sign    Within the past 12 months, you worried that your food would run out before you got the money to buy more.: Never true    Within the past 12 months, the food you bought  just didn't last and you didn't have money to get more.: Never true  Transportation Needs: No Transportation Needs (08/06/2024)   Received from [facility] - Transportation    In the past 12 months, has lack of transportation kept you from medical appointments or from getting medications?: No    Lack of Transportation (Non-Medical): No  Physical Activity: Not on file  Stress: Not on file  Social Connections: Unknown (07/12/2024)   Social Connection and Isolation Panel    Frequency of Communication with Friends and Family: Patient declined    Frequency of Social Gatherings with Friends and Family: Patient declined    Attends Religious Services:  Not on Insurance claims handler of Clubs or Organizations: Patient declined    Attends Banker Meetings: Not on file    Marital Status: Patient declined  Intimate Partner Violence: Not At Risk (07/12/2024)   Humiliation, Afraid, Rape, and Kick questionnaire    Fear of Current or Ex-Partner: No    Emotionally Abused: No    Physically Abused: No    Sexually Abused: No    Family History  Family history unknown: Yes   Allergies  Allergen Reactions   Fish Allergy Other (See Comments)    Crab legs result in itching  Crab legs result in itching  Crab legs result in itching   Shellfish Allergy Other (See Comments)    Crab legs result in itching   Morphine  And Codeine Itching    hives   ? Current Outpatient Medications  Medication Sig Dispense Refill   acetaminophen  (TYLENOL ) 325 MG tablet Take 650 mg by mouth every 6 (six) hours as needed for mild pain (pain score 1-3).     albuterol  (VENTOLIN  HFA) 108 (90 Base) MCG/ACT inhaler Inhale 2 puffs into the lungs every 6 (six) hours as needed for wheezing or shortness of breath.     ascorbic acid  (VITAMIN C ) 500 MG tablet Take 500 mg by mouth daily.     atovaquone  (MEPRON ) 750 MG/5ML suspension Take 10 mLs (1,500 mg total) by mouth daily with breakfast. 300 mL 11   bisacodyl  (DULCOLAX) 5 MG EC tablet Take 2 tablets (10 mg total) by mouth daily as needed for severe constipation. 30 tablet 0   Cholecalciferol (VITAMIN D3) 50 MCG (2000 UT) capsule Take 2,000 Units by mouth daily.     cyanocobalamin  (VITAMIN B12) 1000 MCG tablet Take 1,000 mcg by mouth daily.     diclofenac  Sodium (VOLTAREN ) 1 % GEL Apply 2 g topically 3 (three) times daily as needed (pain in right hip/thigh and lower back). 100 g 0   dolutegravir -lamiVUDine  (DOVATO ) 50-300 MG tablet Take 1 tablet by mouth daily. Take at least 2 hours prior to iron  and magnesium  oxide. 30 tablet 11   DULoxetine  (CYMBALTA ) 60 MG capsule Take 1 capsule (60 mg total) by mouth daily. 30  capsule 0   gabapentin  (NEURONTIN ) 300 MG capsule Take 1 capsule (300 mg total) by mouth at bedtime. 30 capsule 0   hydrOXYzine  (ATARAX ) 25 MG tablet Take 1 tablet (25 mg total) by mouth 3 (three) times daily as needed for anxiety. 30 tablet 0   lidocaine  (LIDODERM ) 5 % Place 1 patch onto the skin daily.     metFORMIN  (GLUMETZA ) 1000 MG (MOD) 24 hr tablet Take 1 tablet (1,000 mg total) by mouth 2 (two) times daily with a meal. 180 tablet 1   methocarbamol  (ROBAXIN ) 500 MG tablet Take 1 tablet (500  mg total) by mouth every 8 (eight) hours as needed. 30 tablet 0   midodrine  (PROAMATINE ) 5 MG tablet Take 10 mg by mouth 3 (three) times daily with meals.     mupirocin ointment (BACTROBAN) 2 % SMARTSIG:1 Application Topical 2-3 Times Daily     naproxen  (EC NAPROSYN ) 500 MG EC tablet Take 1 tablet (500 mg total) by mouth 2 (two) times daily with a meal. 60 tablet 0   olopatadine (PATANOL) 0.1 % ophthalmic solution Place 1 drop into both eyes 2 (two) times daily.     pantoprazole  (PROTONIX ) 40 MG tablet Take 1 tablet (40 mg total) by mouth daily. 30 tablet 2   polyethylene glycol powder (GLYCOLAX /MIRALAX ) 17 GM/SCOOP powder Take 17 g by mouth daily as needed for moderate constipation. 238 g 0   potassium chloride  SA (KLOR-CON  M) 20 MEQ tablet Take 2 tablets daily for 3 days THEN take 1 tablet (20 mEq total) by mouth every Monday, Wednesday, and Friday at 8 PM. 30 tablet 0   rosuvastatin (CRESTOR) 10 MG tablet Take 10 mg by mouth at bedtime.     torsemide  (DEMADEX ) 20 MG tablet Take 2 tablets daily for 3 days THEN 1 tablet (20 mg total) by mouth every Monday, Wednesday, and Friday at 8 PM. 30 tablet 0   traMADol  (ULTRAM ) 50 MG tablet Take 50 mg by mouth 2 (two) times daily as needed.     No current facility-administered medications for this visit.    REVIEW OF SYSTEMS:  Const: negative fever, negative chills, weight gain of 10 pounds in 6 months Eyes: negative diplopia or visual changes, negative eye  pain ENT: negative coryza, negative sore throat Resp: negative cough, hemoptysis, dyspnea Cards: negative for chest pain, palpitations, lower extremity edema GU: negative for frequency, dysuria and hematuria Skin: negative for rash and pruritus Heme: negative for easy bruising and gum/nose bleeding MS: low back pain Neurolo:negative for headaches, dizziness, vertigo, memory problems  Psych: schizophrenia  Pertinent Positives include :sulfa  caused rash when she wa sin the hospital Objective:  VITALS:  BP 106/75   Pulse (!) 103   Temp 98 F (36.7 C) (Temporal)   Ht 5' 8 (1.727 m)   Wt 273 lb (123.8 kg)   SpO2 94%   BMI 41.51 kg/m  PHYSICAL EXAM:  General: Alert, cooperative, no distress,obese- emotional instability  Head: Normocephalic, without obvious abnormality, atraumatic. Eyes: Conjunctivae clear, anicteric sclerae. Pupils are equal Nose: Nares normal. No drainage or sinus tenderness. Throat: Lips, mucosa, and tongue normal. No Thrush Neck: Supple, symmetrical, no adenopathy, thyroid: non tender no carotid bruit and no JVD. Back: No CVA tenderness. Lungs: Clear to auscultation bilaterally. No Wheezing or Rhonchi. No rales. Heart: Regular rate and rhythm, no murmur, rub or gallop. Abdomen: Soft, non-tender,not distended. Bowel sounds normal. No masses Extremities: Extremities normal, atraumatic, no cyanosis. No edema. No clubbing Skin: No rashes or lesions. Not Jaundiced Lymph: Cervical, supraclavicular normal. Neurologic: Grossly non-focal Pertinent Labs { IMAGING RESULTS: Health maintenance Vaccination  Vaccine Date last given comment  Influenza    Hepatitis B    Hepatitis A    Prevnar-PCV-13    Pneumovac-PPSV-23    TdaP    HPV    Shingrix ( zoster vaccine)     ______________________  Labs Lab Result  Date comment  HIV VL     CD4     Genotype     [id number]     HIV antibody     RPR  Quantiferon Gold     Hep C ab     Hepatitis B-ab,ag,c      Hepatitis A-IgM, IgG /T     Lipid     GC/CHL     PAP     HB,PLT,Cr, LFT       Preventive  Procedure Result  Date comment  colonoscopy     Mammogram     Dental exam     Opthal       Impression/Recommendation ? AIDS- saw her when she was in the hospital-  on Dovato  Last Vl was <20 and cd4 is 308 ( 13%)  from 08/06/24  Newly diagnosed DM- weight gain of 173  pounds is contributing to it Currently on metofrmin Need to see endocrine specialist Or management by PCP.  Excessive weight gain of 173 pounds- combination of treatment for HIV, not using cocaine anymore, not homeless and substituting cocaine with sugary foods addiction I will call her PCP to discuss ? Glp1 inhibitor use  Discussed wieight reuction, avoiding refined carbs, having whole foods, vegetables Behavioral disorder  H/o cocaine use  Will get her vaccination records  Health maintenance to be updated Discussed with patient in detail, Discussed  group home attendant   Follow up 3 months  ?Left a message for her PCP -Candis Hocking [phone] to call me ? ___________________________________________________

## 2024-09-24 ENCOUNTER — Other Ambulatory Visit: Payer: Self-pay | Admitting: Family Medicine

## 2024-09-24 DIAGNOSIS — M5416 Radiculopathy, lumbar region: Secondary | ICD-10-CM

## 2024-09-24 DIAGNOSIS — M25552 Pain in left hip: Secondary | ICD-10-CM

## 2024-09-25 ENCOUNTER — Ambulatory Visit: Payer: MEDICAID | Admitting: Registered Nurse

## 2024-09-25 ENCOUNTER — Ambulatory Visit
Admission: RE | Admit: 2024-09-25 | Discharge: 2024-09-25 | Disposition: A | Discharge status: Home / Self Care | Payer: MEDICAID | Attending: Gastroenterology | Admitting: Gastroenterology

## 2024-09-25 ENCOUNTER — Encounter: Payer: Self-pay | Admitting: Gastroenterology

## 2024-09-25 ENCOUNTER — Encounter
Admission: RE | Disposition: A | Discharge status: Home / Self Care | Payer: Self-pay | Source: Home / Self Care | Attending: Gastroenterology

## 2024-09-25 ENCOUNTER — Encounter: Payer: Self-pay | Admitting: Family Medicine

## 2024-09-25 ENCOUNTER — Other Ambulatory Visit: Payer: Self-pay

## 2024-09-25 DIAGNOSIS — Z1211 Encounter for screening for malignant neoplasm of colon: Secondary | ICD-10-CM | POA: Insufficient documentation

## 2024-09-25 HISTORY — PX: COLONOSCOPY: SHX5424

## 2024-09-25 LAB — GLUCOSE, CAPILLARY: Glucose-Capillary: 105 mg/dL — ABNORMAL HIGH (ref 70–99)

## 2024-09-25 SURGERY — COLONOSCOPY
Anesthesia: General

## 2024-09-25 MED ORDER — PROPOFOL 1000 MG/100ML IV EMUL
INTRAVENOUS | Status: AC
  Filled 2024-09-25: qty 100

## 2024-09-25 MED ORDER — PHENYLEPHRINE 80 MCG/ML (10ML) SYRINGE FOR IV PUSH (FOR BLOOD PRESSURE SUPPORT)
PREFILLED_SYRINGE | INTRAVENOUS | Status: AC
  Filled 2024-09-25: qty 10

## 2024-09-25 MED ORDER — PROPOFOL 10 MG/ML IV BOLUS
INTRAVENOUS | Status: DC | PRN
  Administered 2024-09-25 (×2): 50 mg via INTRAVENOUS

## 2024-09-25 MED ORDER — PROPOFOL 500 MG/50ML IV EMUL
INTRAVENOUS | Status: DC | PRN
  Administered 2024-09-25: 125 ug/kg/min via INTRAVENOUS

## 2024-09-25 MED ORDER — SODIUM CHLORIDE 0.9 % IV SOLN: INTRAVENOUS | Status: DC

## 2024-09-25 MED ORDER — LIDOCAINE HCL (PF) 2 % IJ SOLN
INTRAMUSCULAR | Status: AC
Start: 2024-09-25 — End: 2024-09-25
  Filled 2024-09-25: qty 5

## 2024-09-25 MED ORDER — PHENYLEPHRINE 80 MCG/ML (10ML) SYRINGE FOR IV PUSH (FOR BLOOD PRESSURE SUPPORT)
PREFILLED_SYRINGE | INTRAVENOUS | Status: DC | PRN
  Administered 2024-09-25: 80 ug via INTRAVENOUS

## 2024-09-25 MED ORDER — LIDOCAINE HCL (CARDIAC) PF 100 MG/5ML IV SOSY
PREFILLED_SYRINGE | INTRAVENOUS | Status: DC | PRN
  Administered 2024-09-25: 50 mg via INTRAVENOUS

## 2024-09-25 NOTE — Op Note (Signed)
 [facility]astroenter[facility] Patient Name: Meghan Jarvis Procedure Date: 09/25/2024 8:55 AM MRN: [id number] Account #: [id number] Date of Birth: Jun 20, 1976 Admit Type: Outpatient Age: 48 Room: [facility] ENDO ROOM 4 Gender: Female Note Status: Finalized Instrument Name: Colon Scope [id number] Procedure:             Colonoscopy Indications:           Screening for colorectal malignant neoplasm Providers:             Rogelia Copping MD, MD Referring MD:          [facility] (Referring MD) Medicines:             Propofol  per Anesthesia Complications:         No immediate complications. Procedure:             Pre-Anesthesia Assessment:                        - Prior to the procedure, a History and Physical was                         performed, and patient medications and allergies were                         reviewed. The patient's tolerance of previous                         anesthesia was also reviewed. The risks and benefits                         of the procedure and the sedation options and risks                         were discussed with the patient. All questions were                         answered, and informed consent was obtained. Prior                         Anticoagulants: The patient has taken no anticoagulant                         or antiplatelet agents. ASA Grade Assessment: II - A                         patient with mild systemic disease. After reviewing                         the risks and benefits, the patient was deemed in                         satisfactory condition to undergo the procedure.                        After obtaining informed consent, the colonoscope was                         passed under direct vision. Throughout the procedure,  the patient's blood pressure, pulse, and oxygen                         saturations were monitored continuously. The                         Colonoscope was introduced through the  anus and                         advanced to the the cecum, identified by appendiceal                         orifice and ileocecal valve. The colonoscopy was                         performed without difficulty. The patient tolerated                         the procedure well. The quality of the bowel                         preparation was excellent. Findings:      The perianal and digital rectal examinations were normal.      The colon (entire examined portion) appeared normal. Impression:            - The entire examined colon is normal.                        - No specimens collected. Recommendation:        - Discharge patient to home.                        - Resume previous diet.                        - Continue present medications.                        - Repeat colonoscopy in 10 years for screening                         purposes. Procedure Code(s):     --- Professional ---                        [phone], Colonoscopy, flexible; diagnostic, including                         collection of specimen(s) by brushing or washing, when                         performed (separate procedure) Diagnosis Code(s):     --- Professional ---                        Z12.11, Encounter for screening for malignant neoplasm                         of colon CPT copyright 2022 American Medical Association. All rights reserved. The codes documented in this report are preliminary and upon coder review may  be  revised to meet current compliance requirements. Rogelia Copping MD, MD 09/25/2024 9:25:26 AM This report has been signed electronically. Number of Addenda: 0 Note Initiated On: 09/25/2024 8:55 AM Scope Withdrawal Time: 0 hours 6 minutes 54 seconds  Total Procedure Duration: 0 hours 11 minutes 32 seconds  Estimated Blood Loss:  Estimated blood loss: none.      [facility]

## 2024-09-25 NOTE — Anesthesia Preprocedure Evaluation (Signed)
 Anesthesia Evaluation  Patient identified by MRN, date of birth, ID band Patient awake    Reviewed: Allergy & Precautions, NPO status , Patient's Chart, lab work & pertinent test results  Airway Mallampati: III  TM Distance: >3 FB Neck ROM: full    Dental  (+) Missing, Poor Dentition, Loose, Dental Advisory Given   Pulmonary neg pulmonary ROS, asthma , Current Smoker and Patient abstained from smoking.   Pulmonary exam normal  + decreased breath sounds      Cardiovascular Exercise Tolerance: Good negative cardio ROS Normal cardiovascular exam Rhythm:Regular Rate:Normal     Neuro/Psych  Headaches PSYCHIATRIC DISORDERS  Depression   Dementia negative neurological ROS  negative psych ROS   GI/Hepatic negative GI ROS, Neg liver ROS,,,  Endo/Other  negative endocrine ROSdiabetes, Type 2, Oral Hypoglycemic Agents  Class 3 obesity  Renal/GU negative Renal ROS  negative genitourinary   Musculoskeletal   Abdominal  (+) + obese  Peds negative pediatric ROS (+)  Hematology negative hematology ROS (+)   Anesthesia Other Findings Past Medical History: No date: Asthma No date: Depression No date: HIV (human immunodeficiency virus infection) (HCC)  Past Surgical History: No date: ABDOMINAL HYSTERECTOMY No date: APPENDECTOMY  BMI    Body Mass Index: 41.24 kg/m      Reproductive/Obstetrics negative OB ROS                              Anesthesia Physical Anesthesia Plan  ASA: 3  Anesthesia Plan: General   Post-op Pain Management:    Induction: Intravenous  PONV Risk Score and Plan: Propofol  infusion and TIVA  Airway Management Planned: Natural Airway and Nasal Cannula  Additional Equipment:   Intra-op Plan:   Post-operative Plan:   Informed Consent: I have reviewed the patients History and Physical, chart, labs and discussed the procedure including the risks, benefits and  alternatives for the proposed anesthesia with the patient or authorized representative who has indicated his/her understanding and acceptance.     Dental Advisory Given  Plan Discussed with: CRNA  Anesthesia Plan Comments:         Anesthesia Quick Evaluation

## 2024-09-25 NOTE — H&P (Signed)
 Rogelia Copping, MD [facility] [address], [address] [place], [place] [address] Phone: [phone] Fax : [phone]  Primary Care Physician:  [name] Primary Gastroenterologist:  Dr. Copping  Pre-Procedure History & Physical: HPI:  Meghan Jarvis is a 48 y.o. female is here for a screening colonoscopy.   Past Medical History:  Diagnosis Date   Asthma    Depression    HIV (human immunodeficiency virus infection) (HCC)     Past Surgical History:  Procedure Laterality Date   ABDOMINAL HYSTERECTOMY     APPENDECTOMY      Prior to Admission medications   Medication Sig Start Date End Date Taking? Authorizing Provider  acetaminophen  (TYLENOL ) 325 MG tablet Take 650 mg by mouth every 6 (six) hours as needed for mild pain (pain score 1-3).    [provider]  albuterol  (VENTOLIN  HFA) 108 (90 Base) MCG/ACT inhaler Inhale 2 puffs into the lungs every 6 (six) hours as needed for wheezing or shortness of breath.    [provider]  ascorbic acid  (VITAMIN C ) 500 MG tablet Take 500 mg by mouth daily.    [provider]  atovaquone  (MEPRON ) 750 MG/5ML suspension Take 10 mLs (1,500 mg total) by mouth daily with breakfast. 05/07/24 05/07/25  Von Bellis, MD  bisacodyl  (DULCOLAX) 5 MG EC tablet Take 2 tablets (10 mg total) by mouth daily as needed for severe constipation. 05/07/24   Von Bellis, MD  Cholecalciferol (VITAMIN D3) 50 MCG (2000 UT) capsule Take 2,000 Units by mouth daily.    [provider]  cyanocobalamin  (VITAMIN B12) 1000 MCG tablet Take 1,000 mcg by mouth daily.    [provider]  diclofenac  Sodium (VOLTAREN ) 1 % GEL Apply 2 g topically 3 (three) times daily as needed (pain in right hip/thigh and lower back). 05/07/24   Von Bellis, MD  dolutegravir -lamiVUDine  (DOVATO ) 50-300 MG tablet Take 1 tablet by mouth daily. Take at least 2 hours prior to iron  and magnesium  oxide. 05/07/24 05/07/25  Von Bellis, MD  DULoxetine  (CYMBALTA ) 60  MG capsule Take 1 capsule (60 mg total) by mouth daily. 05/08/24   Von Bellis, MD  gabapentin  (NEURONTIN ) 300 MG capsule Take 1 capsule (300 mg total) by mouth at bedtime. 05/07/24   Von Bellis, MD  hydrOXYzine  (ATARAX ) 25 MG tablet Take 1 tablet (25 mg total) by mouth 3 (three) times daily as needed for anxiety. 05/07/24   Von Bellis, MD  lidocaine  (LIDODERM ) 5 % Place 1 patch onto the skin daily. 08/21/24   [provider]  metFORMIN  (GLUMETZA ) 1000 MG (MOD) 24 hr tablet Take 1 tablet (1,000 mg total) by mouth 2 (two) times daily with a meal. 07/13/24   Wouk, Devaughn Sayres, MD  methocarbamol  (ROBAXIN ) 500 MG tablet Take 1 tablet (500 mg total) by mouth every 8 (eight) hours as needed. 07/31/24   Triplett, Kirk B, FNP  midodrine  (PROAMATINE ) 5 MG tablet Take 10 mg by mouth 3 (three) times daily with meals.    [provider]  mupirocin ointment (BACTROBAN) 2 % SMARTSIG:1 Application Topical 2-3 Times Daily 06/07/24   [provider]  naproxen  (EC NAPROSYN ) 500 MG EC tablet Take 1 tablet (500 mg total) by mouth 2 (two) times daily with a meal. 08/24/24   Evans, Alexandra, PA-C  olopatadine (PATANOL) 0.1 % ophthalmic solution Place 1 drop into both eyes 2 (two) times daily.    [provider]  pantoprazole  (PROTONIX ) 40 MG tablet Take 1 tablet (40 mg total) by  mouth daily. 05/07/24 09/16/24  Von Bellis, MD  polyethylene glycol powder (GLYCOLAX /MIRALAX ) 17 GM/SCOOP powder Take 17 g by mouth daily as needed for moderate constipation. 05/07/24   Von Bellis, MD  potassium chloride  SA (KLOR-CON  M) 20 MEQ tablet Take 2 tablets daily for 3 days THEN take 1 tablet (20 mEq total) by mouth every Monday, Wednesday, and Friday at 8 PM. 05/12/24   Von Bellis, MD  rosuvastatin (CRESTOR) 10 MG tablet Take 10 mg by mouth at bedtime. 08/12/24   [provider]  torsemide  (DEMADEX ) 20 MG tablet Take 2 tablets daily for 3 days THEN 1 tablet (20 mg total) by mouth every Monday,  Wednesday, and Friday at 8 PM. 05/12/24   Von Bellis, MD  traMADol  (ULTRAM ) 50 MG tablet Take 50 mg by mouth 2 (two) times daily as needed. 06/03/24   [provider]    Allergies as of 06/17/2024 - Review Complete 06/17/2024  Allergen Reaction Noted   Fish allergy Other (See Comments) 10/27/2013   Shellfish allergy Other (See Comments) 05/18/2016   Buprenorphine hcl  05/18/2016   Morphine  and codeine Itching 11/13/2015   Sulfa  antibiotics Rash 12/24/2023    Family History  Family history unknown: Yes    Social History   Socioeconomic History   Marital status: Divorced    Spouse name: Not on file   Number of children: Not on file   Years of education: Not on file   Highest education level: Not on file  Occupational History   Not on file  Tobacco Use   Smoking status: Every Day    Current packs/day: 1.00    Types: Cigarettes   Smokeless tobacco: Never  Vaping Use   Vaping status: Former  Substance and Sexual Activity   Alcohol use: Not Currently   Drug use: Not Currently    Types: Cocaine   Sexual activity: Yes    Birth control/protection: Surgical  Other Topics Concern   Not on file  Social History Narrative   Not on file   Social Drivers of Health   Financial Resource Strain: Patient Declined (09/23/2024)   Received from [facility]   Overall Financial Resource Strain (CARDIA)    Difficulty of Paying Living Expenses: Patient declined  Food Insecurity: No Food Insecurity (09/23/2024)   Received from [facility]   Hunger Vital Sign    Within the past 12 months, you worried that your food would run out before you got the money to buy more.: Never true    Within the past 12 months, the food you bought just didn't last and you didn't have money to get more.: Never true  Transportation Needs: No Transportation Needs (09/23/2024)   Received from [facility]    In the past 12  months, has lack of transportation kept you from medical appointments or from getting medications?: No    Lack of Transportation (Non-Medical): No  Physical Activity: Not on file  Stress: Not on file  Social Connections: Unknown (07/12/2024)   Social Connection and Isolation Panel    Frequency of Communication with Friends and Family: Patient declined    Frequency of Social Gatherings with Friends and Family: Patient declined    Attends Religious Services: Not on Marketing executive or Organizations: Patient declined    Attends Banker Meetings: Not on file    Marital Status: Patient declined  Intimate Partner Violence: Not  At Risk (07/12/2024)   Humiliation, Afraid, Rape, and Kick questionnaire    Fear of Current or Ex-Partner: No    Emotionally Abused: No    Physically Abused: No    Sexually Abused: No    Review of Systems: See HPI, otherwise negative ROS  Physical Exam: There were no vitals taken for this visit. General:   Alert,  pleasant and cooperative in NAD Head:  Normocephalic and atraumatic. Neck:  Supple; no masses or thyromegaly. Lungs:  Clear throughout to auscultation.    Heart:  Regular rate and rhythm. Abdomen:  Soft, nontender and nondistended. Normal bowel sounds, without guarding, and without rebound.   Neurologic:  Alert and  oriented x4;  grossly normal neurologically.  Impression/Plan: Meghan Jarvis is now here to undergo a screening colonoscopy.  Risks, benefits, and alternatives regarding colonoscopy have been reviewed with the patient.  Questions have been answered.  All parties agreeable.

## 2024-09-25 NOTE — Transfer of Care (Signed)
 Immediate Anesthesia Transfer of Care Note  Patient: Meghan Jarvis  Procedure(s) Performed: COLONOSCOPY  Patient Location: PACU  Anesthesia Type:General  Level of Consciousness: drowsy and patient cooperative  Airway & Oxygen Therapy: Patient Spontanous Breathing and Patient connected to nasal cannula oxygen  Post-op Assessment: Report given to RN and Post -op Vital signs reviewed and stable  Post vital signs: stable  Last Vitals:  Vitals Value Taken Time  BP 92/69 09/25/24 09:28  Temp 35.9 C 09/25/24 09:28  Pulse    Resp 19 09/25/24 09:28  SpO2 95 % 09/25/24 09:28    Last Pain:  Vitals:   09/25/24 0928  TempSrc: Tympanic  PainSc: Asleep         Complications: No notable events documented.

## 2024-09-25 NOTE — Anesthesia Postprocedure Evaluation (Signed)
 Anesthesia Post Note  Patient: Meghan Jarvis  Procedure(s) Performed: COLONOSCOPY  Patient location during evaluation: PACU Anesthesia Type: General Level of consciousness: awake Pain management: pain level controlled Vital Signs Assessment: post-procedure vital signs reviewed and stable Respiratory status: spontaneous breathing Cardiovascular status: stable Anesthetic complications: no   No notable events documented.   Last Vitals:  Vitals:   09/25/24 0939 09/25/24 0948  BP: 92/75 (!) 91/45  Pulse: (!) 108 93  Resp: 19 18  Temp:    SpO2: 95%     Last Pain:  Vitals:   09/25/24 0948  TempSrc:   PainSc: 0-No pain                 VAN STAVEREN,Suzzette Gasparro

## 2024-10-08 ENCOUNTER — Inpatient Hospital Stay
Admission: RE | Admit: 2024-10-08 | Discharge: 2024-10-08 | Disposition: A | Payer: MEDICAID | Source: Ambulatory Visit | Attending: Family Medicine | Admitting: Family Medicine

## 2024-10-08 ENCOUNTER — Ambulatory Visit
Admission: RE | Admit: 2024-10-08 | Discharge: 2024-10-08 | Disposition: A | Discharge status: Home / Self Care | Payer: MEDICAID | Source: Ambulatory Visit | Attending: Family Medicine | Admitting: Family Medicine

## 2024-10-08 DIAGNOSIS — M5416 Radiculopathy, lumbar region: Secondary | ICD-10-CM

## 2024-10-08 DIAGNOSIS — M25552 Pain in left hip: Secondary | ICD-10-CM

## 2024-10-11 ENCOUNTER — Emergency Department: Payer: MEDICAID

## 2024-10-11 ENCOUNTER — Emergency Department
Admission: EM | Admit: 2024-10-11 | Discharge: 2024-10-12 | Disposition: A | Discharge status: Home / Self Care | Payer: MEDICAID | Attending: Emergency Medicine | Admitting: Emergency Medicine

## 2024-10-11 ENCOUNTER — Other Ambulatory Visit: Payer: Self-pay

## 2024-10-11 DIAGNOSIS — R0602 Shortness of breath: Secondary | ICD-10-CM | POA: Diagnosis not present

## 2024-10-11 DIAGNOSIS — J45909 Unspecified asthma, uncomplicated: Secondary | ICD-10-CM | POA: Diagnosis not present

## 2024-10-11 DIAGNOSIS — M545 Low back pain, unspecified: Secondary | ICD-10-CM | POA: Insufficient documentation

## 2024-10-11 DIAGNOSIS — R0789 Other chest pain: Secondary | ICD-10-CM | POA: Insufficient documentation

## 2024-10-11 DIAGNOSIS — E119 Type 2 diabetes mellitus without complications: Secondary | ICD-10-CM | POA: Diagnosis not present

## 2024-10-11 DIAGNOSIS — Z21 Asymptomatic human immunodeficiency virus [HIV] infection status: Secondary | ICD-10-CM | POA: Insufficient documentation

## 2024-10-11 DIAGNOSIS — R079 Chest pain, unspecified: Secondary | ICD-10-CM

## 2024-10-11 LAB — COMPREHENSIVE METABOLIC PANEL WITH GFR
ALT: 12 U/L (ref 0–44)
AST: 21 U/L (ref 15–41)
Albumin: 3.5 g/dL (ref 3.5–5.0)
Alkaline Phosphatase: 88 U/L (ref 38–126)
Anion gap: 10 (ref 5–15)
BUN: 17 mg/dL (ref 6–20)
CO2: 25 mmol/L (ref 22–32)
Calcium: 9.3 mg/dL (ref 8.9–10.3)
Chloride: 106 mmol/L (ref 98–111)
Creatinine, Ser: 1.14 mg/dL — ABNORMAL HIGH (ref 0.44–1.00)
GFR, Estimated: 59 mL/min — ABNORMAL LOW (ref >60)
Glucose, Bld: 93 mg/dL (ref 70–99)
Potassium: 4 mmol/L (ref 3.5–5.1)
Sodium: 141 mmol/L (ref 135–145)
Total Bilirubin: 0.7 mg/dL (ref 0.0–1.2)
Total Protein: 7.2 g/dL (ref 6.5–8.1)

## 2024-10-11 LAB — CBC WITH DIFFERENTIAL/PLATELET
Abs Immature Granulocytes: 0.03 K/uL (ref 0.00–0.07)
Basophils Absolute: 0.1 K/uL (ref 0.0–0.1)
Basophils Relative: 1 %
Eosinophils Absolute: 0.7 K/uL — ABNORMAL HIGH (ref 0.0–0.5)
Eosinophils Relative: 8 %
HCT: 33.3 % — ABNORMAL LOW (ref 36.0–46.0)
Hemoglobin: 10.4 g/dL — ABNORMAL LOW (ref 12.0–15.0)
Immature Granulocytes: 0 %
Lymphocytes Relative: 28 %
Lymphs Abs: 2.3 K/uL (ref 0.7–4.0)
MCH: 25.7 pg — ABNORMAL LOW (ref 26.0–34.0)
MCHC: 31.2 g/dL (ref 30.0–36.0)
MCV: 82.2 fL (ref 80.0–100.0)
Monocytes Absolute: 0.7 K/uL (ref 0.1–1.0)
Monocytes Relative: 8 %
Neutro Abs: 4.4 K/uL (ref 1.7–7.7)
Neutrophils Relative %: 55 %
Platelets: 276 K/uL (ref 150–400)
RBC: 4.05 MIL/uL (ref 3.87–5.11)
RDW: 15.5 % (ref 11.5–15.5)
WBC: 8 K/uL (ref 4.0–10.5)
nRBC: 0 % (ref 0.0–0.2)

## 2024-10-11 LAB — D-DIMER, QUANTITATIVE: D-Dimer, Quant: 0.73 ug{FEU}/mL — ABNORMAL HIGH (ref 0.00–0.50)

## 2024-10-11 LAB — TROPONIN I (HIGH SENSITIVITY)
Troponin I (High Sensitivity): 3 ng/L (ref <18)
Troponin I (High Sensitivity): 3 ng/L (ref <18)
Troponin I (High Sensitivity): 3 ng/L (ref <18)

## 2024-10-11 LAB — LIPASE, BLOOD: Lipase: 33 U/L (ref 11–51)

## 2024-10-11 LAB — BRAIN NATRIURETIC PEPTIDE: B Natriuretic Peptide: 12.4 pg/mL (ref 0.0–100.0)

## 2024-10-11 MED ORDER — ASPIRIN 81 MG PO CHEW
162.0000 mg | CHEWABLE_TABLET | Freq: Once | ORAL | Status: AC
Start: 2024-10-11 — End: 2024-10-11
  Administered 2024-10-11: 162 mg via ORAL
  Filled 2024-10-11: qty 2

## 2024-10-11 MED ORDER — MIDODRINE HCL 5 MG PO TABS
5.0000 mg | ORAL_TABLET | ORAL | Status: AC
  Administered 2024-10-11: 5 mg via ORAL
  Filled 2024-10-11: qty 1

## 2024-10-11 MED ORDER — IBUPROFEN 600 MG PO TABS
600.0000 mg | ORAL_TABLET | Freq: Once | ORAL | Status: AC
  Administered 2024-10-11: 600 mg via ORAL
  Filled 2024-10-11: qty 1

## 2024-10-11 MED ORDER — ACETAMINOPHEN 500 MG PO TABS
1000.0000 mg | ORAL_TABLET | ORAL | Status: AC
  Administered 2024-10-11: 1000 mg via ORAL
  Filled 2024-10-11: qty 2

## 2024-10-11 MED ORDER — IOHEXOL 350 MG/ML SOLN
75.0000 mL | Freq: Once | INTRAVENOUS | Status: AC | PRN
  Administered 2024-10-11: 75 mL via INTRAVENOUS

## 2024-10-11 NOTE — ED Notes (Signed)
 Pt requested assistance with the restroom. I assisted her to the toilet in the room, standby assist and back to the bed. She also requested a warm blanket. Falls bundle intact. Waiting for fiance to arrive. No further needs at this time

## 2024-10-11 NOTE — ED Notes (Signed)
 Fall risk bundle is currently in place.

## 2024-10-11 NOTE — ED Triage Notes (Signed)
 Pt to ER via EMS from [facility] with c/o sharp midsternal chest pain for last 4 days.  Pt also reports abdominal pain for last week.  Pt vomited x 1 today.  Per EMS pt also requesting a pregnancy test.

## 2024-10-11 NOTE — ED Provider Notes (Signed)
 [facility] Provider Note    Event Date/Time   First MD Initiated Contact with Patient 10/11/24 1503     (approximate)   History   Chest Pain   HPI  Meghan Jarvis is a 48 y.o. female   history of type 2 diabetes, use of midodrine , HIV/AIDS, substance-induced mood disorder and asthma  All patient presents reports for about 4 days she has had a sharp discomfort in her chest and feels slightly short of breath.  It is achy across the front of her chest when she takes a deep breath or moves.  Does not radiate.  She reports at 1 point she felt nauseated, EMS report did note she was vomiting, but patient reports she never threw up.  She has not noticed any abdominal pain associate with that but felt nauseated prior  She denies any history of heart disease.  She reports she was told to have a pregnancy test by someone at her group home. [Patient reports hysterectomy, previous CT scan also documents patient has had a hysterectomy]  She has been compliant with her medication  No cough no wheezing no fevers or chills.  She reports having low back pain now for couple months being followed by the [facility] clinic had MRIs done recently.  No change with that.        Physical Exam   Triage Vital Signs: ED Triage Vitals  Encounter Vitals Group     BP 10/11/24 1434 105/67     Girls Systolic BP Percentile --      Girls Diastolic BP Percentile --      Boys Systolic BP Percentile --      Boys Diastolic BP Percentile --      Pulse Rate 10/11/24 1434 99     Resp 10/11/24 1434 (!) 23     Temp --      Temp src --      SpO2 10/11/24 1434 97 %     Weight 10/11/24 1437 271 lb 2.7 oz (123 kg)     Height 10/11/24 1437 5' 8 (1.727 m)     Head Circumference --      Peak Flow --      Pain Score 10/11/24 1434 10     Pain Loc --      Pain Education --      Exclude from Growth Chart --     Most recent vital signs: Vitals:   10/11/24 2335 10/12/24 0000  BP:  110/72   Pulse:  83  Resp:  19  Temp: 98.3 F (36.8 C)   SpO2:  97%     General: Awake, no distress.  She is pleasant.  Able to sit herself up, when she does so though she reports a achy pain in her lower back radiates towards her left upper leg, reports this is the pain she has been experiencing and seeing her doctor at [facility] for no changes. CV:  Good peripheral perfusion.  Normal tones and rate.  She is tender to palpation across the anterior sternum, she reports when she pushes on it and demonstrates that it causes the pain and the sharpness. Resp:  Normal effort.  Clear lungs bilateral speaks in full clear sentences Abd:  No distention.  Soft nontender nondistended at this time.  No nausea or vomiting reported at this point, patient does report she felt nauseated earlier but that is subsided.  She denies that she vomited [triage note reports she vomited once,  patient reports that she did not throw up] Other:  Bilateral lower extremity pitting edema, mild, patient reports this to be somewhat chronic but seems just slightly worse in the left leg than typical   ED Results / Procedures / Treatments   Labs (all labs ordered are listed, but only abnormal results are displayed) Labs Reviewed  CBC WITH DIFFERENTIAL/PLATELET - Abnormal; Notable for the following components:      Result Value   Hemoglobin 10.4 (*)    HCT 33.3 (*)    MCH 25.7 (*)    Eosinophils Absolute 0.7 (*)    All other components within normal limits  COMPREHENSIVE METABOLIC PANEL WITH GFR - Abnormal; Notable for the following components:   Creatinine, Ser 1.14 (*)    GFR, Estimated 59 (*)    All other components within normal limits  D-DIMER, QUANTITATIVE - Abnormal; Notable for the following components:   D-Dimer, Quant 0.73 (*)    All other components within normal limits  LIPASE, BLOOD  BRAIN NATRIURETIC PEPTIDE  TROPONIN I (HIGH SENSITIVITY)  TROPONIN I (HIGH SENSITIVITY)  TROPONIN I (HIGH SENSITIVITY)      EKG  Inter by me at 1440 heart rate 95 QRS 90 QTc 450 Normal sinus rhythm, no evidence of acute ischemia.  Slight artifact.   RADIOLOGY  CT Angio Chest PE W and/or Wo Contrast Result Date: 10/11/2024 EXAM: CTA of the Chest with contrast for PE 10/11/2024 06:40:38 PM TECHNIQUE: CTA of the chest was performed after the administration of 75 mL of iohexol  (OMNIPAQUE ) 350 MG/ML injection. Multiplanar reformatted images are provided for review. MIP images are provided for review. Automated exposure control, iterative reconstruction, and/or weight based adjustment of the mA/kV was utilized to reduce the radiation dose to as low as reasonably achievable. COMPARISON: 12/09/2023 CLINICAL HISTORY: Pulmonary embolism (PE) suspected, low to intermediate prob, positive D-dimer. FINDINGS: PULMONARY ARTERIES: Pulmonary arteries are adequately opacified for evaluation. No pulmonary embolism. Main pulmonary artery is normal in caliber. MEDIASTINUM: Cardiomegaly. Scattered coronary artery calcifications. Vascular congestion. There is no acute abnormality of the thoracic aorta. LYMPH NODES: No mediastinal, hilar or axillary lymphadenopathy. LUNGS AND PLEURA: The lungs are without acute process. No focal consolidation or pulmonary edema. No pleural effusion or pneumothorax. UPPER ABDOMEN: Limited images of the upper abdomen are unremarkable. SOFT TISSUES AND BONES: No acute bone or soft tissue abnormality. IMPRESSION: 1. No pulmonary embolism. 2. Cardiomegaly with scattered coronary artery calcifications and vascular congestion. Electronically signed by: Franky Crease MD 10/11/2024 06:44 PM EDT RP Workstation: [id number]   US  Venous Img Lower Bilateral Result Date: 10/11/2024 CLINICAL DATA:  Lower leg edema. EXAM: BILATERAL LOWER EXTREMITY VENOUS DOPPLER ULTRASOUND TECHNIQUE: Gray-scale sonography with compression, as well as color and duplex ultrasound, were performed to evaluate the deep venous system(s) from the  level of the common femoral vein through the popliteal and proximal calf veins. COMPARISON:  None Available. FINDINGS: VENOUS Normal compressibility of the common femoral, superficial femoral, and popliteal veins, as well as the visualized calf veins. Visualized portions of profunda femoral vein and great saphenous vein unremarkable. No filling defects to suggest DVT on grayscale or color Doppler imaging. Doppler waveforms show normal direction of venous flow, normal respiratory plasticity and response to augmentation. Limited views of the contralateral common femoral vein are unremarkable. OTHER None. Limitations: Limited evaluation due to patient's body habitus. IMPRESSION: No evidence of deep venous thrombosis. Electronically Signed   By: Leita Birmingham M.D.   On: 10/11/2024 16:35   DG Chest 2  View Result Date: 10/11/2024 EXAM: 2 VIEW(S) XRAY OF THE CHEST 10/11/2024 03:05:28 PM COMPARISON: 08/02/225 CLINICAL HISTORY: chest pain FINDINGS: LUNGS AND PLEURA: No focal pulmonary opacity. No pulmonary edema. No pleural effusion. No pneumothorax. HEART AND MEDIASTINUM: No acute abnormality of the cardiac and mediastinal silhouettes. BONES AND SOFT TISSUES: No acute osseous abnormality. IMPRESSION: 1. No acute cardiopulmonary process. Electronically signed by: Lynwood Seip MD 10/11/2024 03:22 PM EDT RP Workstation: [id number]   Chest x-ray inter by me is negative for acute   PROCEDURES:  Critical Care performed: No  Procedures   MEDICATIONS ORDERED IN ED: Medications  ibuprofen  (ADVIL ) tablet 600 mg (600 mg Oral Given 10/11/24 1620)  aspirin chewable tablet 162 mg (162 mg Oral Given 10/11/24 1619)  midodrine  (PROAMATINE ) tablet 5 mg (5 mg Oral Given 10/11/24 1620)  iohexol  (OMNIPAQUE ) 350 MG/ML injection 75 mL (75 mLs Intravenous Contrast Given 10/11/24 1831)  midodrine  (PROAMATINE ) tablet 5 mg (5 mg Oral Given 10/11/24 1939)  acetaminophen  (TYLENOL ) tablet 1,000 mg (1,000 mg Oral Given 10/11/24 2031)      IMPRESSION / MDM / ASSESSMENT AND PLAN / ED COURSE  I reviewed the triage vital signs and the nursing notes.                              Differential diagnosis includes, but is not limited to, ACS, aortic dissection, pulmonary embolism, cardiac tamponade, pneumothorax, pneumonia, pericarditis, myocarditis, GI-related causes including esophagitis/gastritis, and musculoskeletal chest wall pain.    Doubt clinically any associated abdominal cause.  Patient denies that she vomited, but exam seems to be more suggestive of a pleuritic or reproducible anterior chest discomfort.  Patient's presentation is most consistent with acute complicated illness / injury requiring diagnostic workup.   On reassessment patient eating without difficulty.  Symptoms seem to improve.  She is resting comfortably.  Blood pressure appropriate she does take midodrine  at home.   Clinical Course as of 10/12/24 0019  [place] Oct 12, 2024  0003 Sentrell bird Legal Guardian Emergency Contact [phone] Legal guardian  Left voicemail on confidential DSS voicemail notifying of ER visit and discharge back to care home. [MQ]  0003 Discussed with patient, patient comfortable with plan to return to her group/care home.  She advises she does have a guardianship with DSS, I have called and left voicemail with her legal guardian.  Patient resting comfortable without distress reassuring workup.  Appropriate for discharge [MQ]    Clinical Course User Index [MQ] Dicky Anes, MD   ----------------------------------------- 12:18 AM on 10/12/2024 ----------------------------------------- Workup to this point very reassuring.  Patient comfortable with plan to return to her care facility.  Of left message for DSS guardian.  Discussed plan recommended follow-up with cardiology and return precautions provided to patient she is in agreement  Return precautions and treatment recommendations and follow-up discussed with the  patient who is agreeable with the plan.  At this juncture given the reproducible nature and most suspect likely musculoskeletal, possible costochondritis or perhaps mild pleurisy.  No evidence of ACS PE or life-threatening chest illness or intra-abdominal concerns noted at this time  FINAL CLINICAL IMPRESSION(S) / ED DIAGNOSES   Final diagnoses:  Chest pain with low risk for cardiac etiology     Rx / DC Orders   ED Discharge Orders     None        Note:  This document was prepared using Dragon voice recognition software and may include unintentional dictation  errors.   Dicky Anes, MD 10/12/24 [phone]

## 2024-10-11 NOTE — Discharge Instructions (Addendum)
 Recommend you follow closely with our cardiology team.  This will be important for you, please call to schedule follow-up appointment Monday.  Additionally please follow-up closely with your primary care doctor.  If your symptoms worsen become concerned about increasing pain shortness of breath fever cough or other concerns arise please return to the emergency department right away.

## 2024-10-11 NOTE — ED Notes (Signed)
 Patient transported to X-ray

## 2024-10-11 NOTE — ED Notes (Signed)
 Sent urine to lab

## 2024-10-23 NOTE — Progress Notes (Unsigned)
  Cardiology Office Note   Date:  10/24/2024  ID:  Meghan, Jarvis Jan 29, 1976, MRN [id number] PCP: Venson Candis CROME, NP  [facility] Providers Cardiologist:  Caron Poser, MD     History of Present Illness Meghan Jarvis is a 48 y.o. female PMH DM 2, HIV on ART, polysubstance abuse, HLD who presents for further evaluation management of chest discomfort.  Patient reports she has had about a 1 to 56-month history of constant chest pain.  She also notes shortness of breath.  She says the pain seems to be worse with inspiration.  She also reports intermittent tachycardia and palpitations.  There was reportedly a single syncopal episode.  Last LDL 123 04/2024.  Seen in the ED recently for this where a BNP, serial troponin, were negative.  D-dimer is mildly elevated so CTPA was obtained.  No PE was seen, but she does have scattered coronary artery calcification.  Relevant CVD History -CTPA 10/2024 with scattered CAC -TTE 04/09/2024 normal biventricular function, grade 1 diastolic dysfunction, no significant valvular disease   ROS: Pt denies any chest discomfort, jaw pain, arm pain, palpitations, syncope, presyncope, orthopnea, PND, or LE edema.  Studies Reviewed I have independently reviewed the patient's ECG, recent CT scan, previous cardiac testing, previous medical records.  Physical Exam VS:  BP 100/68 (BP Location: Left Arm, Patient Position: Sitting, Cuff Size: Large)   Pulse 92   Ht 5' 8 (1.727 m)   Wt 279 lb (126.6 kg)   SpO2 98%   BMI 42.42 kg/m        Wt Readings from Last 3 Encounters:  10/24/24 279 lb (126.6 kg)  10/11/24 271 lb 2.7 oz (123 kg)  09/25/24 271 lb 3.2 oz (123 kg)    GEN: No acute distress. NECK: No JVD; No carotid bruits. CARDIAC: RRR, no murmurs, rubs, gallops. RESPIRATORY:  Clear to auscultation. EXTREMITIES:  Warm and well-perfused. No edema.  ASSESSMENT AND PLAN Chest discomfort Patient presents with undifferentiated chest pain.  She  does have risk factors for obstructive CAD.  Given the inspiratory quality, I also do worry about something such as pericarditis.  Further evaluation is indicated.  Plan: - Coronary CT angiogram to rule out obstructive CAD - Echocardiogram to evaluate for structural etiology including presence of an effusion - Inflammatory markers to rule out pericarditis  Paroxysmal tachycardia Palpitation Syncope and collapse Patient reports intermittent tachycardia and palpitations.  She also reports a single syncopal episode.  Plan: - Echocardiogram as above - Monitor to evaluate for arrhythmia  CAC HLD Mild CAC seen on recent CT scan.  Last LDL 123 04/2024.  Will plan to recheck lipids and increase her Crestor to 40 mg.  LDL goal less than 70 given diabetes and CAC.  If we are not quite at goal, then we may need to add Repatha.        Dispo: RTC as needed after cardiac testing  Signed, Caron Poser, MD

## 2024-10-24 ENCOUNTER — Ambulatory Visit: Payer: MEDICAID

## 2024-10-24 VITALS — BP 100/68 | HR 92 | Ht 68.0 in | Wt 279.0 lb

## 2024-10-24 DIAGNOSIS — R55 Syncope and collapse: Secondary | ICD-10-CM

## 2024-10-24 DIAGNOSIS — I2510 Atherosclerotic heart disease of native coronary artery without angina pectoris: Secondary | ICD-10-CM | POA: Diagnosis present

## 2024-10-24 DIAGNOSIS — Z79899 Other long term (current) drug therapy: Secondary | ICD-10-CM | POA: Diagnosis present

## 2024-10-24 DIAGNOSIS — E782 Mixed hyperlipidemia: Secondary | ICD-10-CM | POA: Insufficient documentation

## 2024-10-24 DIAGNOSIS — R079 Chest pain, unspecified: Secondary | ICD-10-CM | POA: Diagnosis present

## 2024-10-24 DIAGNOSIS — I479 Paroxysmal tachycardia, unspecified: Secondary | ICD-10-CM | POA: Diagnosis present

## 2024-10-24 DIAGNOSIS — R002 Palpitations: Secondary | ICD-10-CM | POA: Insufficient documentation

## 2024-10-24 MED ORDER — METOPROLOL TARTRATE 50 MG PO TABS: ORAL_TABLET | ORAL | 0 refills | Status: AC

## 2024-10-24 NOTE — Patient Instructions (Addendum)
 Medication Instructions:  Your physician recommends the following medication changes.   INCREASE: Rosuvastatin (CRESTOR) to 40 mg by  mouth daily  *If you need a refill on your cardiac medications before your next appointment, please call your pharmacy*  Lab Work: Your provider would like for you to have following labs drawn today ESR, CRP, LIPID PANEL, TSH.   If you have labs (blood work) drawn today and your tests are completely normal, you will receive your results only by: MyChart Message (if you have MyChart) OR A paper copy in the mail If you have any lab test that is abnormal or we need to change your treatment, we will call you to review the results.  Testing/Procedures: Your physician has requested that you have an echocardiogram. Echocardiography is a painless test that uses sound waves to create images of your heart. It provides your doctor with information about the size and shape of your heart and how well your heart's chambers and valves are working.   You may receive an ultrasound enhancing agent through an IV if needed to better visualize your heart during the echo. This procedure takes approximately one hour.  There are no restrictions for this procedure.  This will take place at [address] [facility] [address] [facility]) [address], [place] [address]  Please note: We ask at that you not bring children with you during ultrasound (echo/ vascular) testing. Due to room size and safety concerns, children are not allowed in the ultrasound rooms during exams. Our front office staff cannot provide observation of children in our lobby area while testing is being conducted. An adult accompanying a patient to their appointment will only be allowed in the ultrasound room at the discretion of the ultrasound technician under special circumstances. We apologize for any inconvenience.      Your cardiac CT will be scheduled at one of the below locations:   [facility] [address] [place], [place] [address] [phone]  Please arrive 15 mins early for check-in and test prep.  There is spacious parking Copy Available) and easy access to the radiology department from the [facility] entrance. Please enter here and check-in with the desk attendant.   Please follow these instructions carefully (unless otherwise directed):  An IV will be required for this test and Nitroglycerin  will be given.  Hold all erectile dysfunction medications at least 3 days (72 hrs) prior to test. (Ie viagra, cialis, sildenafil, tadalafil, etc)   On the Night Before the Test: Be sure to Drink plenty of water. Do not consume any caffeinated/decaffeinated beverages or chocolate 12 hours prior to your test. Do not take any antihistamines 12 hours prior to your test.  On the Day of the Test: Drink plenty of water until 1 hour prior to the test. Do not eat any food 1 hour prior to test. You may take your regular medications prior to the test.  Take metoprolol  (Lopressor ) 50 MG  two hours prior to test. Please HOLD torsemide  (DEMADEX ) 20 mg on the morning of the test. Patients who wear a continuous glucose monitor MUST remove the device prior to scanning. FEMALES- please wear underwire-free bra if available, avoid dresses & tight clothing      After the Test: Drink plenty of water. After receiving IV contrast, you may experience a mild flushed feeling. This is normal. On occasion, you may experience a mild rash up to 24 hours after the test. This is not dangerous. If this  occurs, you can take Benadryl  25 mg, Zyrtec, Claritin, or Allegra and increase your fluid intake. (Patients taking Tikosyn should avoid Benadryl , and may take Zyrtec, Claritin, or Allegra) If you experience trouble breathing, this can be serious. If it is severe call 911 IMMEDIATELY. If it is mild, please call our office.  We will call to schedule your test 2-4 weeks out  understanding that some insurance companies will need an authorization prior to the service being performed.   For more information and frequently asked questions, please visit our website : [email or web address]  For non-scheduling related questions, please contact the cardiac imaging nurse navigator should you have any questions/concerns: Cardiac Imaging Nurse Navigators Direct Office Dial: [phone]   For scheduling needs, including cancellations and rescheduling, please call Brittany, [phone].     ZIO XT- Long Term Monitor Instructions  Your physician has requested you wear a ZIO patch monitor for 14 days.  This is a single patch monitor. [employer] supplies one patch monitor per enrollment. Additional stickers are not available. Please do not apply patch if you will be having a Nuclear Stress Test, Echocardiogram, Cardiac CT, MRI, or Chest Xray during the period you would be wearing the monitor. The patch cannot be worn during these tests. You cannot remove and re-apply the ZIO XT patch monitor.  Your ZIO patch monitor will be mailed 3 day USPS to your address on file. It may take 3-5 days to receive your monitor after you have been enrolled. Once you have received your monitor, please review the enclosed instructions. Your monitor has already been registered assigning a specific monitor serial number to you.  Billing and Patient Assistance Program Information  We have supplied [employer] with any of your insurance information on file for billing purposes.  [employer] offers a sliding scale Patient Assistance Program for patients that do not have insurance, or whose insurance does not completely cover the cost of the ZIO monitor.  You must apply for the Patient Assistance Program to qualify for this discounted rate.  To apply, please call [employer] at [phone], select option 4, select option 2, ask to apply for Patient Assistance Program. Meredeth will ask your household income,  and how many people are in your household. They will quote your out-of-pocket cost based on that information. [employer] will also be able to set up a 79-month, interest-free payment plan if needed.  Applying the monitor   Shave hair from upper left chest.  Hold abrader disc by orange tab. Rub abrader in 40 strokes over the upper left chest as indicated in your monitor instructions.  Clean area with 4 enclosed alcohol pads. Let dry.  Apply patch as indicated in monitor instructions. Patch will be placed under collarbone on left side of chest with arrow pointing upward.  Rub patch adhesive wings for 2 minutes. Remove white label marked 1. Remove the white label marked 2. Rub patch adhesive wings for 2 additional minutes.  While looking in a mirror, press and release button in center of patch. A small green light will flash 3-4 times. This will be your only indicator that the monitor has been turned on.   After Applying Monitor: Do not shower for the first 24 hours. You may shower after the first 24 hours.  Press the button if you feel a symptom. You will hear a small click. Record Date, Time and Symptom in the Patient Logbook.   After Completing 14 Days: When you are ready to remove the patch, follow instructions on the  last 2 pages of Patient Logbook.  Stick patch monitor into the tabs at the bottom of the return box.  Place Patient Logbook in the blue and white box. Use locking tab on box and tape box closed securely. The blue and white box has prepaid postage on it. Please place it in the mailbox as soon as possible. Your physician should have your test results approximately 7-14 days after the monitor has been mailed back to [facility].   Troubleshooting: Call [facility] at [phone] if you have questions regarding your ZIO XT patch monitor.  Call them immediately if you see an orange light blinking on your monitor.  If your monitor falls off in less than 4 days,  contact our Monitor department at [phone].  If your monitor becomes loose or falls off after 4 days call [employer] at [phone] for suggestions on securing your monitor.   Follow-Up: At [facility], you and your health needs are our priority.  As part of our continuing mission to provide you with exceptional heart care, our providers are all part of one team.  This team includes your primary Cardiologist (physician) and Advanced Practice Providers or APPs (Physician Assistants and Nurse Practitioners) who all work together to provide you with the care you need, when you need it.  Your next appointment:   As Needed   Provider:   You may see Caron Poser, MD or one of the following Advanced Practice Providers on your designated Care Team:   Lonni Meager, NP Lesley Maffucci, PA-C Bernardino Bring, PA-C Cadence Martinsburg, PA-C Tylene Lunch, NP Barnie Hila, NP    We recommend signing up for the patient portal called MyChart.  Sign up information is provided on this After Visit Summary.  MyChart is used to connect with patients for Virtual Visits (Telemedicine).  Patients are able to view lab/test results, encounter notes, upcoming appointments, etc.  Non-urgent messages can be sent to your provider as well.   To learn more about what you can do with MyChart, go to [email or web address].

## 2024-10-24 NOTE — Patient Instructions (Incomplete)
 Medication Instructions:  *** *If you need a refill on your cardiac medications before your next appointment, please call your pharmacy*  Lab Work: *** If you have labs (blood work) drawn today and your tests are completely normal, you will receive your results only by: MyChart Message (if you have MyChart) OR A paper copy in the mail If you have any lab test that is abnormal or we need to change your treatment, we will call you to review the results.  Testing/Procedures: ***  Follow-Up: At [facility], you and your health needs are our priority.  As part of our continuing mission to provide you with exceptional heart care, our providers are all part of one team.  This team includes your primary Cardiologist (physician) and Advanced Practice Providers or APPs (Physician Assistants and Nurse Practitioners) who all work together to provide you with the care you need, when you need it.  Your next appointment:   {numbers 1-12:10294} {Time; day/wk/mo/yr(s):9076}  Provider:   {Providers/Teams        :[id number]} {If no MD populates, click here to update Cardiologist or EP   DO NOT delete brackets or number around this link :1}   We recommend signing up for the patient portal called MyChart.  Sign up information is provided on this After Visit Summary.  MyChart is used to connect with patients for Virtual Visits (Telemedicine).  Patients are able to view lab/test results, encounter notes, upcoming appointments, etc.  Non-urgent messages can be sent to your provider as well.   To learn more about what you can do with MyChart, go to [email or web address].   Other Instructions ***

## 2024-10-24 NOTE — Addendum Note (Signed)
 Addended by: HARL HERON DEL on: 10/24/2024 12:41 PM   Modules accepted: Orders

## 2024-10-25 ENCOUNTER — Ambulatory Visit: Payer: Self-pay

## 2024-10-25 LAB — LIPID PANEL
Chol/HDL Ratio: 3.2 ratio (ref 0.0–4.4)
Cholesterol, Total: 142 mg/dL (ref 100–199)
HDL: 45 mg/dL (ref >39)
LDL Chol Calc (NIH): 77 mg/dL (ref 0–99)
Triglycerides: 110 mg/dL (ref 0–149)
VLDL Cholesterol Cal: 20 mg/dL (ref 5–40)

## 2024-10-25 LAB — BASIC METABOLIC PANEL WITH GFR
BUN/Creatinine Ratio: 14 (ref 9–23)
BUN: 13 mg/dL (ref 6–24)
CO2: 24 mmol/L (ref 20–29)
Calcium: 10.2 mg/dL (ref 8.7–10.2)
Chloride: 102 mmol/L (ref 96–106)
Creatinine, Ser: 0.95 mg/dL (ref 0.57–1.00)
Glucose: 73 mg/dL (ref 70–99)
Potassium: 4.7 mmol/L (ref 3.5–5.2)
Sodium: 142 mmol/L (ref 134–144)
eGFR: 73 mL/min/1.73 (ref >59)

## 2024-10-25 LAB — TSH: TSH: 1.89 u[IU]/mL (ref 0.450–4.500)

## 2024-10-25 LAB — C-REACTIVE PROTEIN: CRP: 3 mg/L (ref 0–10)

## 2024-10-25 LAB — SEDIMENTATION RATE: Sed Rate: 58 mm/h — ABNORMAL HIGH (ref 0–40)

## 2024-10-31 MED ORDER — EZETIMIBE 10 MG PO TABS: 10.0000 mg | ORAL_TABLET | Freq: Every day | ORAL | 3 refills | Status: AC

## 2024-11-13 DIAGNOSIS — I479 Paroxysmal tachycardia, unspecified: Secondary | ICD-10-CM | POA: Diagnosis not present

## 2024-11-13 DIAGNOSIS — R55 Syncope and collapse: Secondary | ICD-10-CM | POA: Diagnosis not present

## 2024-11-13 DIAGNOSIS — R079 Chest pain, unspecified: Secondary | ICD-10-CM

## 2024-11-19 ENCOUNTER — Encounter (HOSPITAL_COMMUNITY): Payer: Self-pay

## 2024-11-26 ENCOUNTER — Ambulatory Visit: Payer: MEDICAID

## 2024-12-16 ENCOUNTER — Ambulatory Visit: Payer: MEDICAID | Admitting: Infectious Diseases

## 2024-12-25 ENCOUNTER — Ambulatory Visit: Payer: MEDICAID

## 2024-12-25 DIAGNOSIS — I479 Paroxysmal tachycardia, unspecified: Secondary | ICD-10-CM | POA: Insufficient documentation

## 2024-12-25 DIAGNOSIS — R079 Chest pain, unspecified: Secondary | ICD-10-CM | POA: Diagnosis present

## 2024-12-25 DIAGNOSIS — R55 Syncope and collapse: Secondary | ICD-10-CM | POA: Diagnosis present

## 2024-12-25 LAB — ECHOCARDIOGRAM COMPLETE
AR max vel: 2.28 cm2
AV Area VTI: 2.5 cm2
AV Area mean vel: 2.24 cm2
AV Mean grad: 5 mmHg
AV Peak grad: 7.7 mmHg
Ao pk vel: 1.39 m/s
Area-P 1/2: 5.62 cm2
S' Lateral: 2.6 cm

## 2024-12-29 NOTE — Telephone Encounter (Signed)
 Pt is non-verbal and lives at a Care Facility in [place]; [place] states to contact [employer], legal gardian (no answer and voice mailbox is full) or Rosana Later, [facility] Administrator, whom I spoke with.  I gave him the results of her echocardiogram and explained to him that her Coronary CT scan has not been scheduled.  I also explained that she will need to have additional blood work drawn because the original order was on 10/24/2024 and her labs must be drawn with 30 days.  He stated that he would have the care center director to call our office and discuss scheduling.

## 2024-12-30 ENCOUNTER — Encounter: Payer: Self-pay | Admitting: Infectious Diseases

## 2024-12-30 ENCOUNTER — Ambulatory Visit: Payer: MEDICAID | Attending: Infectious Diseases | Admitting: Infectious Diseases

## 2024-12-30 ENCOUNTER — Other Ambulatory Visit
Admission: RE | Admit: 2024-12-30 | Discharge: 2024-12-30 | Disposition: A | Discharge status: Home / Self Care | Payer: MEDICAID | Attending: Infectious Diseases | Admitting: Infectious Diseases

## 2024-12-30 VITALS — BP 107/68 | HR 92 | Temp 98.1°F | Ht 68.0 in | Wt 281.0 lb

## 2024-12-30 DIAGNOSIS — F1411 Cocaine abuse, in remission: Secondary | ICD-10-CM | POA: Diagnosis not present

## 2024-12-30 DIAGNOSIS — Z7984 Long term (current) use of oral hypoglycemic drugs: Secondary | ICD-10-CM | POA: Insufficient documentation

## 2024-12-30 DIAGNOSIS — Z6841 Body Mass Index (BMI) 40.0 and over, adult: Secondary | ICD-10-CM | POA: Insufficient documentation

## 2024-12-30 DIAGNOSIS — R635 Abnormal weight gain: Secondary | ICD-10-CM | POA: Diagnosis not present

## 2024-12-30 DIAGNOSIS — B20 Human immunodeficiency virus [HIV] disease: Secondary | ICD-10-CM | POA: Diagnosis present

## 2024-12-30 DIAGNOSIS — F17210 Nicotine dependence, cigarettes, uncomplicated: Secondary | ICD-10-CM | POA: Diagnosis not present

## 2024-12-30 DIAGNOSIS — Z79899 Other long term (current) drug therapy: Secondary | ICD-10-CM | POA: Insufficient documentation

## 2024-12-30 DIAGNOSIS — E119 Type 2 diabetes mellitus without complications: Secondary | ICD-10-CM | POA: Insufficient documentation

## 2024-12-30 DIAGNOSIS — F209 Schizophrenia, unspecified: Secondary | ICD-10-CM | POA: Diagnosis present

## 2024-12-30 LAB — CBC WITH DIFFERENTIAL/PLATELET
Abs Immature Granulocytes: 0.03 K/uL (ref 0.00–0.07)
Basophils Absolute: 0 K/uL (ref 0.0–0.1)
Basophils Relative: 0 %
Eosinophils Absolute: 0.3 K/uL (ref 0.0–0.5)
Eosinophils Relative: 4 %
HCT: 35.1 % — ABNORMAL LOW (ref 36.0–46.0)
Hemoglobin: 10.9 g/dL — ABNORMAL LOW (ref 12.0–15.0)
Immature Granulocytes: 0 %
Lymphocytes Relative: 34 %
Lymphs Abs: 3.1 K/uL (ref 0.7–4.0)
MCH: 24.7 pg — ABNORMAL LOW (ref 26.0–34.0)
MCHC: 31.1 g/dL (ref 30.0–36.0)
MCV: 79.4 fL — ABNORMAL LOW (ref 80.0–100.0)
Monocytes Absolute: 0.6 K/uL (ref 0.1–1.0)
Monocytes Relative: 7 %
Neutro Abs: 5 K/uL (ref 1.7–7.7)
Neutrophils Relative %: 55 %
Platelets: 358 K/uL (ref 150–400)
RBC: 4.42 MIL/uL (ref 3.87–5.11)
RDW: 16.3 % — ABNORMAL HIGH (ref 11.5–15.5)
WBC: 9.1 K/uL (ref 4.0–10.5)
nRBC: 0 % (ref 0.0–0.2)

## 2024-12-30 LAB — COMPREHENSIVE METABOLIC PANEL WITH GFR
ALT: 16 U/L (ref 0–44)
AST: 23 U/L (ref 15–41)
Albumin: 4 g/dL (ref 3.5–5.0)
Alkaline Phosphatase: 116 U/L (ref 38–126)
Anion gap: 13 (ref 5–15)
BUN: 27 mg/dL — ABNORMAL HIGH (ref 6–20)
CO2: 24 mmol/L (ref 22–32)
Calcium: 9.3 mg/dL (ref 8.9–10.3)
Chloride: 104 mmol/L (ref 98–111)
Creatinine, Ser: 1.11 mg/dL — ABNORMAL HIGH (ref 0.44–1.00)
GFR, Estimated: >60 mL/min
Glucose, Bld: 86 mg/dL (ref 70–99)
Potassium: 4.7 mmol/L (ref 3.5–5.1)
Sodium: 140 mmol/L (ref 135–145)
Total Bilirubin: <0.2 mg/dL (ref 0.0–1.2)
Total Protein: 7.6 g/dL (ref 6.5–8.1)

## 2024-12-30 NOTE — Progress Notes (Signed)
 NAME: Meghan Jarvis  DOB: [birth year]  MRN: [id number]  Date/Time: 12/30/2024 12:17 PM   Subjective:   ?clegail professional services Pt here with group home care giver Candis Hocking PCP- [phone]   Meghan Jarvis is a 49 y.o. with a history of AIDS, newly diagnosed DM, H/o cocaine use, schizophrenia HIV diagnosed in 2021 Nadir Cd4 -12 on 12/09/23 VL  20 million 12/09/23  HAARt history Biktarvy  Dovato   Pt on Dovato  10% adherent Current weight 281 pounds Having back pain , knee pain, chest pain, sob due to weight gain og 180 pounds in 13 months  Prolonged hospitalization at [facility] 12/08/23  -05/07/24 For acute resp failure, cocaine abuse, intubated , Brain MRI cerebral atrophy, after extubation on 12/31 started on Biktarvy  which was changed to Dovato  on 5/27 because of 100 pound weight gain. She was in the hospital longer because of disposition issue, incapacity to make any decisions, DSS involved and she was sent to group home While in the hospital she was extensively investigated for Ois Toxo IgG > 400 Toxo PCR neg Toxo igM neg RPR NR CMV DNA neg Crypto neg HIV RNA 2 million>> 34, 000 Cd4 is 14 ( 2.4%) repeat on 1/29 is 416 ( 23%) Beta D glucan > 500 (  repeated) negative Histoplasma neg Fungal antibodies negative Genosure prime- no resistant mutations QuantiFERON gold indeterminate.  No treatment given .  Can repeat it later.  She also had rt hip pain and low back pain and had MRI of the lumbar spine and rt hip did not show any significant findings other that intramuscular edema and enhancement of rt Gluteus   ? Past Medical History:  Diagnosis Date   Asthma    Depression    HIV (human immunodeficiency virus infection) (HCC)    Cocaine use Failue to thrive  Past Surgical History:  Procedure Laterality Date   ABDOMINAL HYSTERECTOMY     APPENDECTOMY     COLONOSCOPY N/A 09/25/2024   Procedure: COLONOSCOPY;  Surgeon: Jinny Carmine, MD;  Location: [facility] ENDOSCOPY;   Service: Endoscopy;  Laterality: N/A;    Social History   Socioeconomic History   Marital status: Divorced    Spouse name: Not on file   Number of children: Not on file   Years of education: Not on file   Highest education level: Not on file  Occupational History   Not on file  Tobacco Use   Smoking status: Every Day    Current packs/day: 1.00    Types: Cigarettes   Smokeless tobacco: Never  Vaping Use   Vaping status: Former  Substance and Sexual Activity   Alcohol use: Not Currently   Drug use: Not Currently    Types: Cocaine   Sexual activity: Yes    Birth control/protection: Surgical  Other Topics Concern   Not on file  Social History Narrative   Not on file   Social Drivers of Health   Tobacco Use: High Risk (12/30/2024)   Patient History    Smoking Tobacco Use: Every Day    Smokeless Tobacco Use: Never    Passive Exposure: Not on file  Financial Resource Strain: Patient Declined (09/23/2024)   Received from [facility]   Overall Financial Resource Strain (CARDIA)    Difficulty of Paying Living Expenses: Patient declined  Food Insecurity: No Food Insecurity (09/23/2024)   Received from [facility]   Epic    Within the past 12 months, you worried that your food would run out  before you got the money to buy more.: Never true    Within the past 12 months, the food you bought just didn't last and you didn't have money to get more.: Never true  Transportation Needs: No Transportation Needs (09/23/2024)   Received from [facility] - Transportation    In the past 12 months, has lack of transportation kept you from medical appointments or from getting medications?: No    Lack of Transportation (Non-Medical): No  Physical Activity: Not on file  Stress: Not on file  Social Connections: Unknown (07/12/2024)   Social Connection and Isolation Panel    Frequency of Communication with Friends and Family: Patient  declined    Frequency of Social Gatherings with Friends and Family: Patient declined    Attends Religious Services: Not on file    Active Member of Clubs or Organizations: Patient declined    Attends Banker Meetings: Not on file    Marital Status: Patient declined  Intimate Partner Violence: Not At Risk (07/12/2024)   Epic    Fear of Current or Ex-Partner: No    Emotionally Abused: No    Physically Abused: No    Sexually Abused: No  Depression (PHQ2-9): Low Risk (12/30/2024)   Depression (PHQ2-9)    PHQ-2 Score: 0  Alcohol Screen: Not on file  Housing: High Risk (09/23/2024)   Received from [facility]   Epic    In the last 12 months, was there a time when you were not able to pay the mortgage or rent on time?: No    In the past 12 months, how many times have you moved where you were living?: 1    At any time in the past 12 months, were you homeless or living in a shelter (including now)?: Yes  Utilities: Not At Risk (09/23/2024)   Received from [facility]   Epic    In the past 12 months has the electric, gas, oil, or water company threatened to shut off services in your home?: No  Health Literacy: Not on file    Family History  Family history unknown: Yes   Allergies  Allergen Reactions   Fish Allergy Other (See Comments)    Crab legs result in itching  Crab legs result in itching  Crab legs result in itching   Shellfish Allergy Other (See Comments)    Crab legs result in itching   Morphine  And Codeine Itching    hives   ? Current Outpatient Medications  Medication Sig Dispense Refill   acetaminophen  (TYLENOL ) 325 MG tablet Take 650 mg by mouth every 6 (six) hours as needed for mild pain (pain score 1-3).     albuterol  (VENTOLIN  HFA) 108 (90 Base) MCG/ACT inhaler Inhale 2 puffs into the lungs every 6 (six) hours as needed for wheezing or shortness of breath.     ascorbic acid  (VITAMIN C ) 500 MG tablet Take 500 mg by  mouth daily.     atovaquone  (MEPRON ) 750 MG/5ML suspension Take 10 mLs (1,500 mg total) by mouth daily with breakfast. 300 mL 11   bisacodyl  (DULCOLAX) 5 MG EC tablet Take 2 tablets (10 mg total) by mouth daily as needed for severe constipation. 30 tablet 0   Cholecalciferol (VITAMIN D3) 50 MCG (2000 UT) capsule Take 2,000 Units by mouth daily.     cyanocobalamin  (VITAMIN B12) 1000 MCG tablet Take 1,000 mcg by mouth daily.     diclofenac  Sodium (  VOLTAREN ) 1 % GEL Apply 2 g topically 3 (three) times daily as needed (pain in right hip/thigh and lower back). 100 g 0   dolutegravir -lamiVUDine  (DOVATO ) 50-300 MG tablet Take 1 tablet by mouth daily. Take at least 2 hours prior to iron  and magnesium  oxide. 30 tablet 11   DULoxetine  (CYMBALTA ) 60 MG capsule Take 1 capsule (60 mg total) by mouth daily. 30 capsule 0   ezetimibe  (ZETIA ) 10 MG tablet Take 1 tablet (10 mg total) by mouth daily. 90 tablet 3   gabapentin  (NEURONTIN ) 300 MG capsule Take 1 capsule (300 mg total) by mouth at bedtime. 30 capsule 0   hydrOXYzine  (ATARAX ) 25 MG tablet Take 1 tablet (25 mg total) by mouth 3 (three) times daily as needed for anxiety. 30 tablet 0   iron  polysaccharides (NIFEREX) 150 MG capsule Take 150 mg by mouth daily.     ketorolac  (ACULAR ) 0.5 % ophthalmic solution SMARTSIG:In Eye(s)     lidocaine  (LIDODERM ) 5 % Place 1 patch onto the skin daily.     metFORMIN  (GLUMETZA ) 1000 MG (MOD) 24 hr tablet Take 1 tablet (1,000 mg total) by mouth 2 (two) times daily with a meal. 180 tablet 1   methocarbamol  (ROBAXIN ) 500 MG tablet Take 1 tablet (500 mg total) by mouth every 8 (eight) hours as needed. 30 tablet 0   metoprolol  tartrate (LOPRESSOR ) 50 MG tablet TAKE 1 TABLET 2 HR PRIOR TO CARDIAC PROCEDURE 1 tablet 0   midodrine  (PROAMATINE ) 5 MG tablet Take 10 mg by mouth 3 (three) times daily with meals.     naproxen  (EC NAPROSYN ) 500 MG EC tablet Take 1 tablet (500 mg total) by mouth 2 (two) times daily with a meal. 60 tablet 0    olopatadine (PATANOL) 0.1 % ophthalmic solution Place 1 drop into both eyes 2 (two) times daily.     pantoprazole  (PROTONIX ) 40 MG tablet Take 1 tablet (40 mg total) by mouth daily. 30 tablet 2   polyethylene glycol powder (GLYCOLAX /MIRALAX ) 17 GM/SCOOP powder Take 17 g by mouth daily as needed for moderate constipation. 238 g 0   potassium chloride  SA (KLOR-CON  M) 20 MEQ tablet Take 2 tablets daily for 3 days THEN take 1 tablet (20 mEq total) by mouth every Monday, Wednesday, and Friday at 8 PM. 30 tablet 0   prednisoLONE acetate (PRED FORTE) 1 % ophthalmic suspension SMARTSIG:In Eye(s)     Rosuvastatin Calcium  40 MG CPSP Take 40 mg by mouth at bedtime.     torsemide  (DEMADEX ) 20 MG tablet Take 2 tablets daily for 3 days THEN 1 tablet (20 mg total) by mouth every Monday, Wednesday, and Friday at 8 PM. 30 tablet 0   traMADol  (ULTRAM ) 50 MG tablet Take 50 mg by mouth 2 (two) times daily as needed.     mupirocin ointment (BACTROBAN) 2 % SMARTSIG:1 Application Topical 2-3 Times Daily     No current facility-administered medications for this visit.    REVIEW OF SYSTEMS:  Const: negative fever, negative chills, weight gain of 181 pounds in 13 months Eyes: negative diplopia or visual changes, negative eye pain ENT: negative coryza, negative sore throat Resp: negative cough, hemoptysis, dyspnea Cards: negative for chest pain, palpitations, lower extremity edema GU: negative for frequency, dysuria and hematuria Skin: negative for rash and pruritus Heme: negative for easy bruising and gum/nose bleeding MS: low back pain, b/l knee pain Neurolo:negative for headaches, dizziness, vertigo, memory problems  Psych: schizophrenia  Pertinent Positives include :sulfa  caused rash when she  wa sin the hospital Objective:  VITALS:  BP 107/68   Pulse 92   Temp 98.1 F (36.7 C) (Temporal)   Ht 5' 8 (1.727 m)   Wt 281 lb (127.5 kg)   SpO2 94%   BMI 42.73 kg/m  PHYSICAL EXAM:  General: Alert,  cooperative, no distress,obese- emotional instability  Head: Normocephalic, without obvious abnormality, atraumatic. Eyes: Conjunctivae clear, anicteric sclerae. Pupils are equal Nose: Nares normal. No drainage or sinus tenderness. Throat: Lips, mucosa, and tongue normal. No Thrush Neck: Supple, symmetrical, no adenopathy, thyroid: non tender no carotid bruit and no JVD. Back: No CVA tenderness. Lungs: Clear to auscultation bilaterally. No Wheezing or Rhonchi. No rales. Heart: Regular rate and rhythm, no murmur, rub or gallop. Abdomen: Soft, non-tender,not distended. Bowel sounds normal. No masses Extremities: Extremities normal, atraumatic, no cyanosis. No edema. No clubbing Skin: No rashes or lesions. Not Jaundiced Lymph: Cervical, supraclavicular normal. Neurologic: Grossly non-focal   IMAGING RESULTS: Health maintenance Vaccination  Vaccine Date last given comment  Influenza    Hepatitis B 2009/2011   Hepatitis A    Prevnar-PCV-13    Pneumovac-PPSV-23    TdaP 07/12/22   HPV    Shingrix ( zoster vaccine)     ______________________  Labs Lab Result  Date comment  HIV VL < 20 07/15/24   CD4 308 08/06/24   Genotype Neg 12/13/23 Genosure prime  [id number]     HIV antibody     RPR NR    Quantiferon Gold Neg 03/10/24   Hep C ab NEg    Hepatitis B-ab,ag,c     Hepatitis A-IgM, IgG /T     Lipid     GC/CHL     PAP     HB,PLT,Cr, LFT     Toxoplasma IgG 138  Preventive  Procedure Result  Date comment  colonoscopy     Mammogram     Dental exam     Opthal       Impression/Recommendation ? AIDS- -  on Dovato  Last Vl was <20 and cd4 is 308 ( 13%)  from 08/06/24  DM- weight gain of 181  pounds is contributing to it Currently on metofrmin Need to see endocrine specialist  management by PCP.  Excessive weight gain of180 pounds- combination of treatment for HIV, not using cocaine anymore, not homeless and substituting cocaine with sugary foods addiction Left message for PCP  regarding  Glp1 use  Discussed wieight reuction, avoiding refined carbs, having whole foods, vegetables Behavioral disorder  MSK pain  Past H/o cocaine use  Vaccination records updated  Follow up 3 months  Left a message for her PCP -Candis Hocking [phone] to call me ? ___________________________________________________

## 2024-12-31 LAB — T-HELPER CELLS CD4/CD8 %
% CD 4 Pos. Lymph.: 14.3 % — ABNORMAL LOW (ref 30.8–58.5)
Absolute CD 4 Helper: 443 /uL (ref 359–1519)
Basophils Absolute: 0 x10E3/uL (ref 0.0–0.2)
Basos: 0 %
CD3+CD4+ Cells/CD3+CD8+ Cells Bld: 0.39 — ABNORMAL LOW (ref 0.92–3.72)
CD3+CD8+ Cells # Bld: 1132 /uL — ABNORMAL HIGH (ref 109–897)
CD3+CD8+ Cells NFr Bld: 36.5 % — ABNORMAL HIGH (ref 12.0–35.5)
EOS (ABSOLUTE): 0.3 x10E3/uL (ref 0.0–0.4)
Eos: 4 %
Hematocrit: 35.3 % (ref 34.0–46.6)
Hemoglobin: 10.9 g/dL — ABNORMAL LOW (ref 11.1–15.9)
Immature Grans (Abs): 0 x10E3/uL (ref 0.0–0.1)
Immature Granulocytes: 0 %
Lymphocytes Absolute: 3.1 x10E3/uL (ref 0.7–3.1)
Lymphs: 35 %
MCH: 24.7 pg — ABNORMAL LOW (ref 26.6–33.0)
MCHC: 30.9 g/dL — ABNORMAL LOW (ref 31.5–35.7)
MCV: 80 fL (ref 79–97)
Monocytes Absolute: 0.6 x10E3/uL (ref 0.1–0.9)
Monocytes: 7 %
Neutrophils Absolute: 4.9 x10E3/uL (ref 1.4–7.0)
Neutrophils: 54 %
Platelets: 373 x10E3/uL (ref 150–450)
RBC: 4.41 x10E6/uL (ref 3.77–5.28)
RDW: 14.8 % (ref 11.7–15.4)
WBC: 9 x10E3/uL (ref 3.4–10.8)

## 2024-12-31 LAB — HIV-1 RNA QUANT-NO REFLEX-BLD
HIV 1 RNA Quant: <20 {copies}/mL
LOG10 HIV-1 RNA: UNDETERMINED {Log_copies}/mL

## 2025-01-08 ENCOUNTER — Ambulatory Visit: Payer: Self-pay

## 2025-03-05 ENCOUNTER — Ambulatory Visit: Payer: MEDICAID | Admitting: Infectious Diseases
